# Patient Record
Sex: Female | Born: 1960
Health system: Southern US, Community
[De-identification: ages and names within clinical notes are randomized; demographics above are authoritative.]

## PROBLEM LIST (undated history)

## (undated) DIAGNOSIS — K219 Gastro-esophageal reflux disease without esophagitis: Secondary | ICD-10-CM

## (undated) DIAGNOSIS — I10 Essential (primary) hypertension: Secondary | ICD-10-CM

## (undated) DIAGNOSIS — E079 Disorder of thyroid, unspecified: Secondary | ICD-10-CM

## (undated) HISTORY — PX: DENTAL SURGERY: SHX609

## (undated) HISTORY — DX: Gastro-esophageal reflux disease without esophagitis: K21.9

## (undated) HISTORY — DX: Disorder of thyroid, unspecified: E07.9

## (undated) HISTORY — DX: Essential (primary) hypertension: I10

---

## 2005-06-10 ENCOUNTER — Ambulatory Visit: Payer: Self-pay | Admitting: Family Medicine

## 2005-06-10 IMAGING — CR DG CHEST 2V
1 series · 3 of 3 positions shown · non-contrast
Comparison: none

REASON FOR EXAM: Unspecified disorder of blood and blood forming organs
COMMENTS:

PROCEDURE:     DXR - DXR CHEST PA (OR AP) AND LATERAL  - [DATE] [DATE]
RESULT:     The lungs fields are clear. No pneumonia, pneumothorax or
pleural effusion is seen. The heart size is normal. There is a moderate
thoracic lumbar scoliosis with convexity to the RIGHT.

[Series 4128: postero_anterior · 0.22mm/px · 3 of 3 slices shown]
[im 1/3]
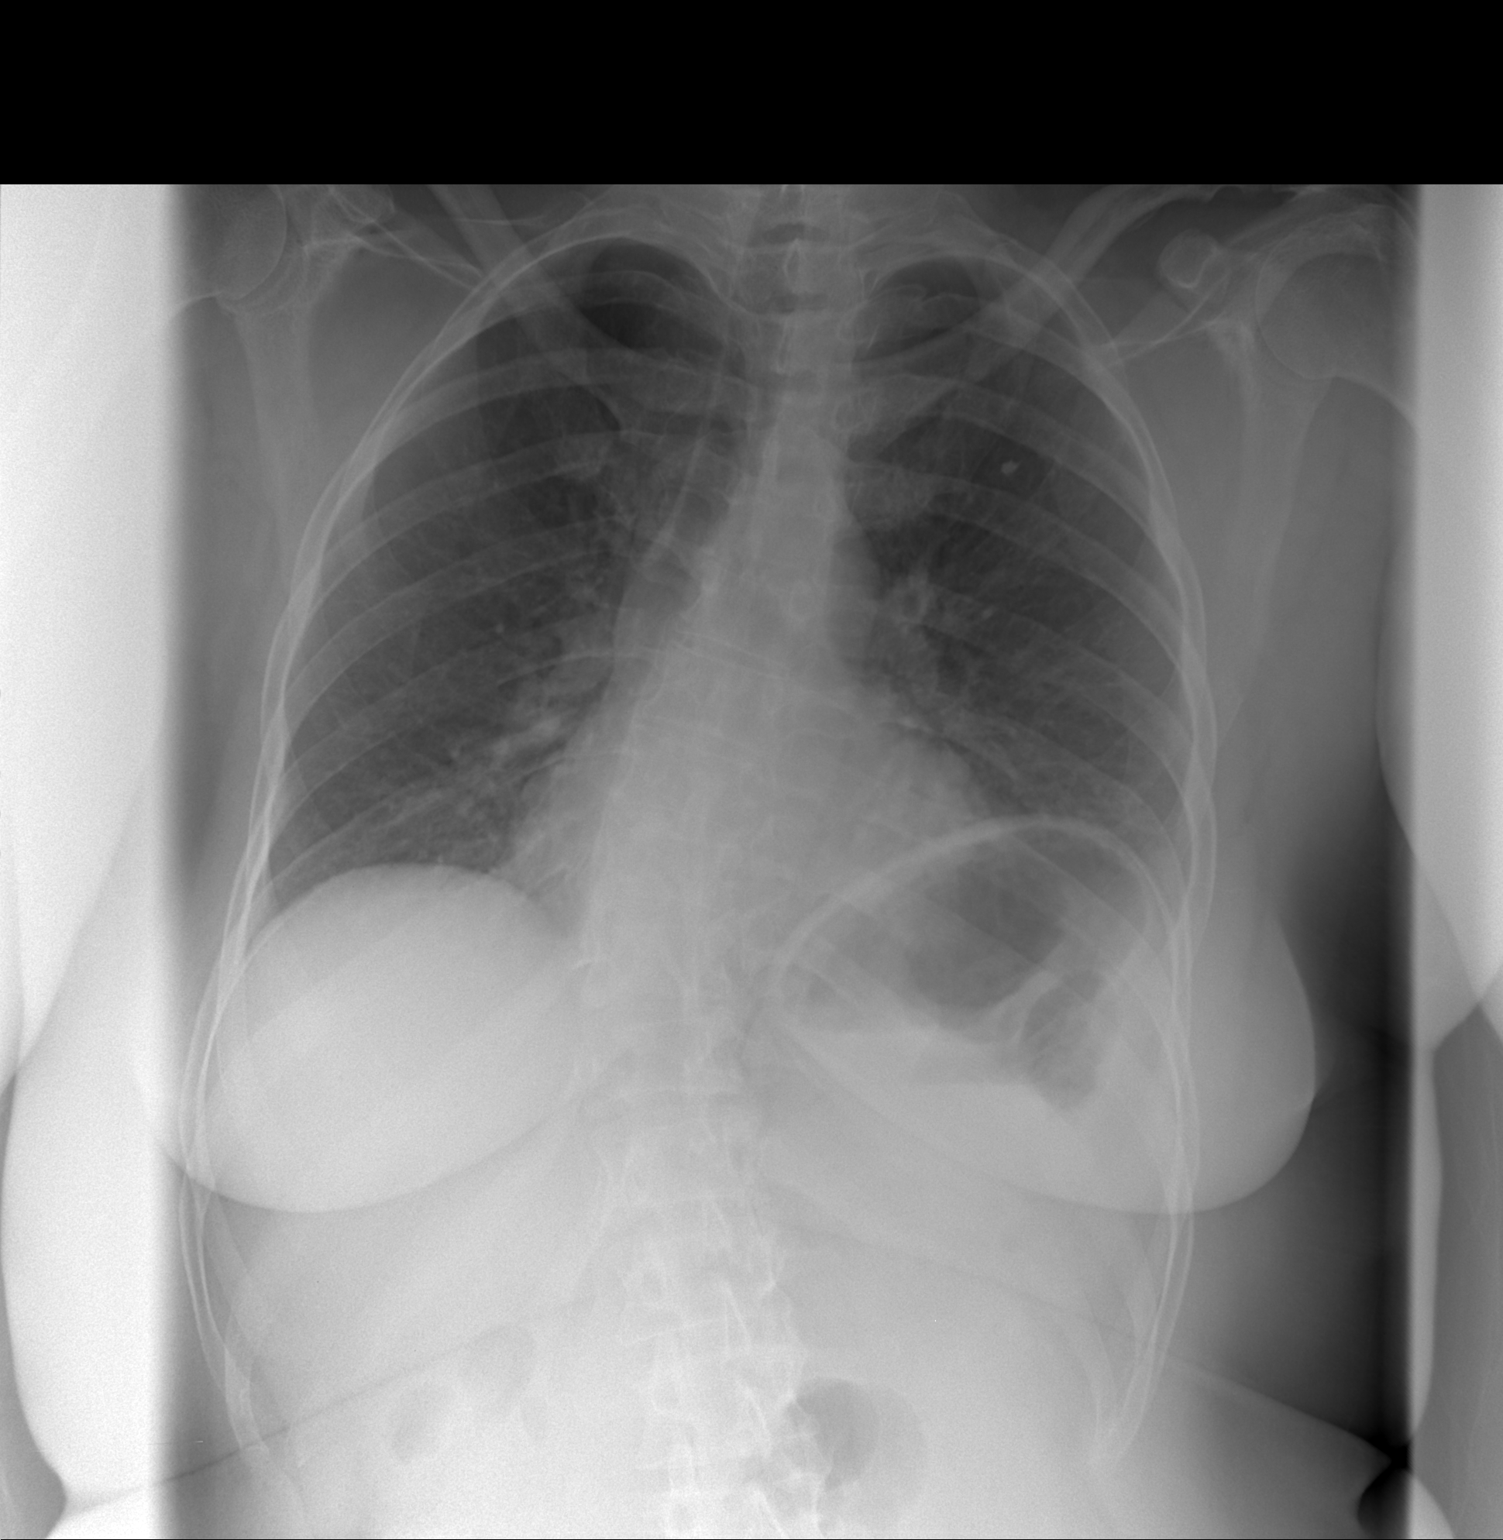
[im 2/3]
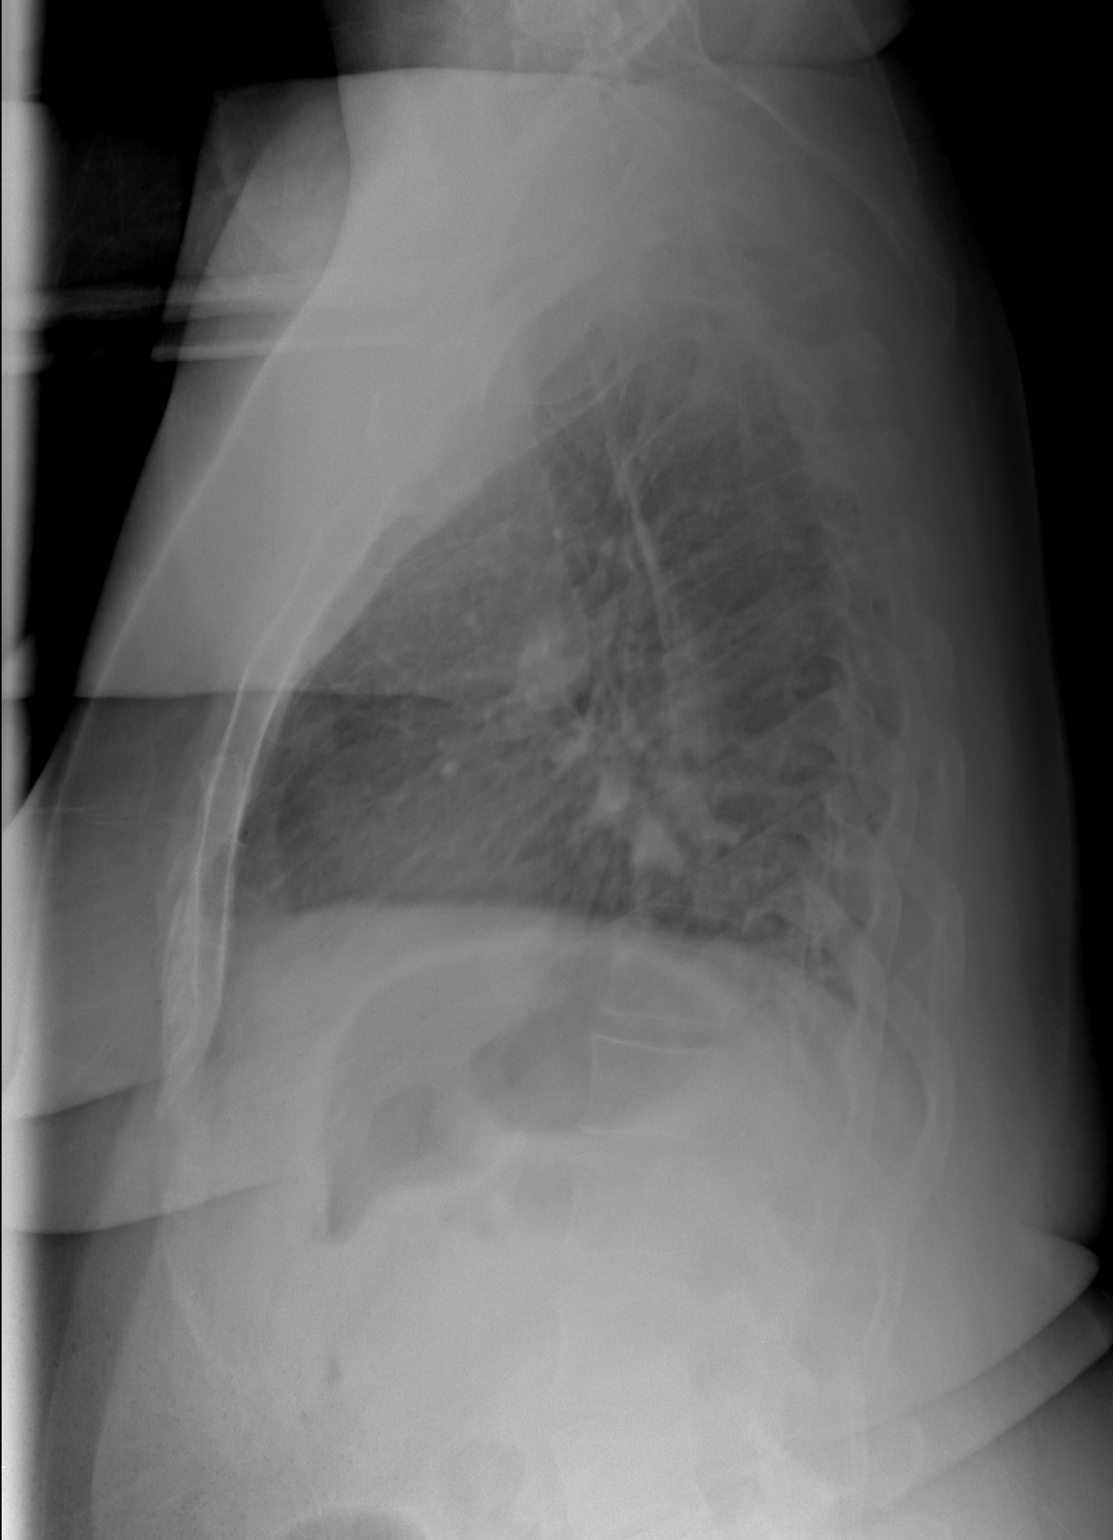
[im 3/3]
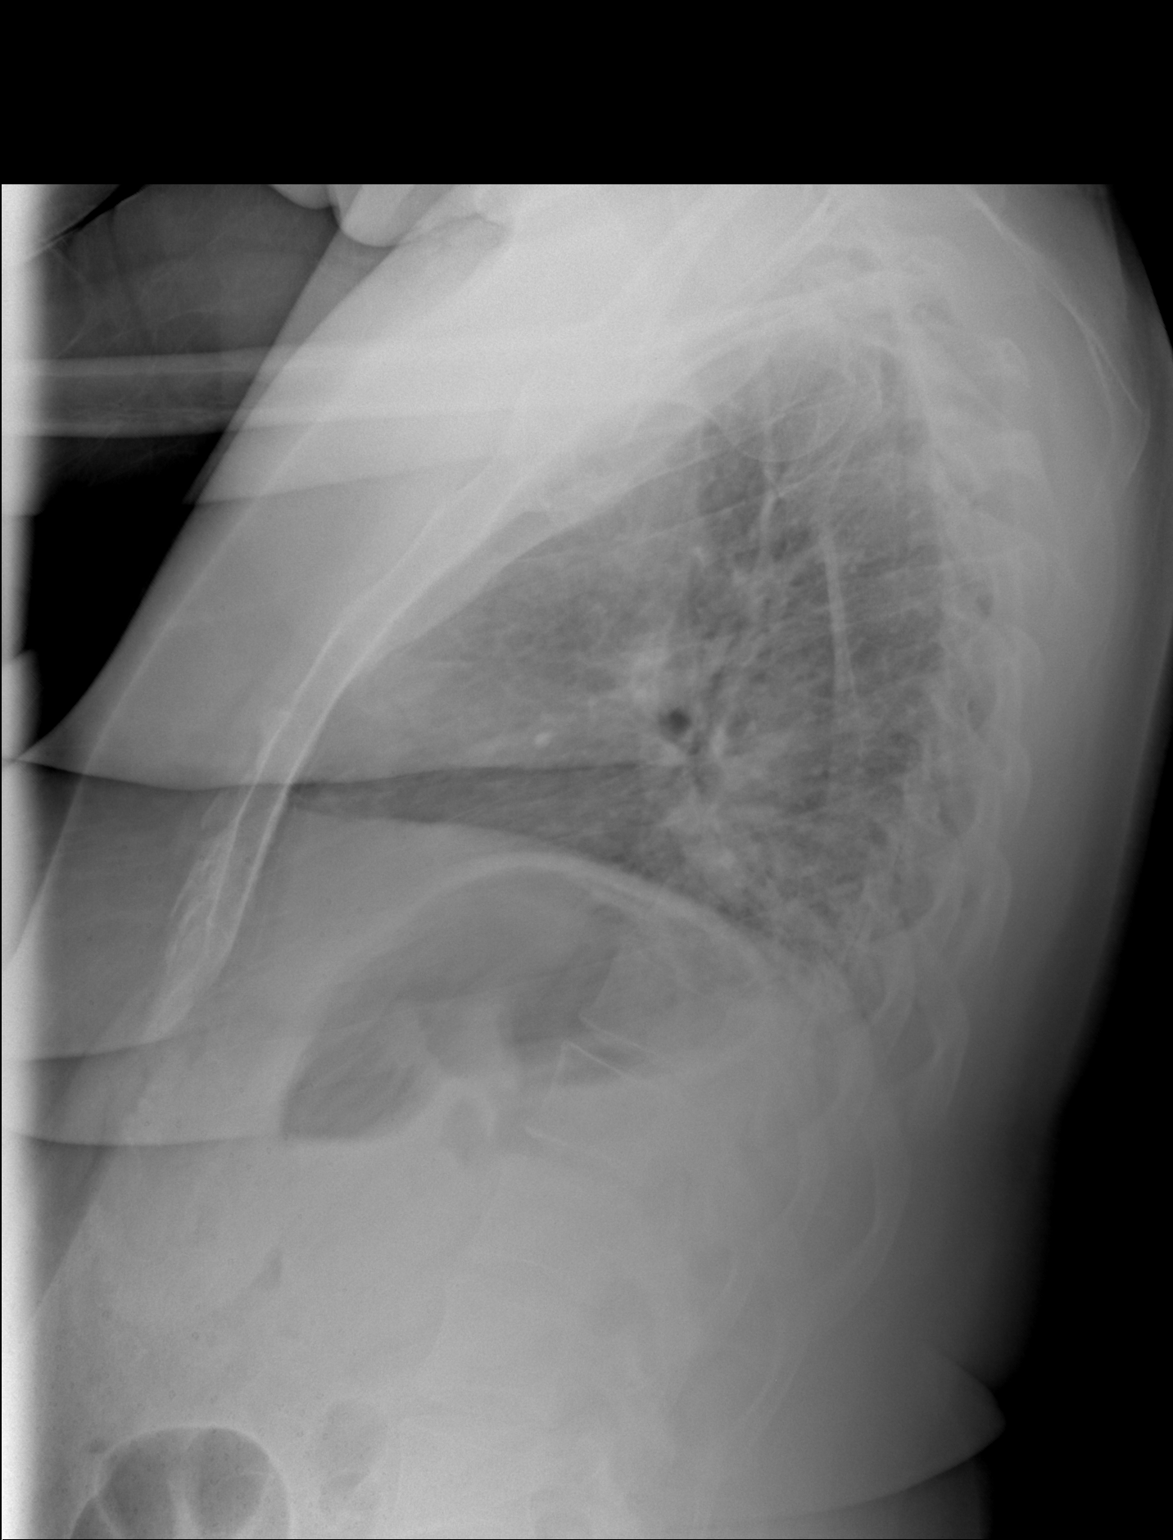

[3 of 3 positions shown; findings below may reference images not displayed]

IMPRESSION: 1.     No acute changes are identified.
2.     Thoracolumbar scoliosis is noted.
3.     The heart size is normal.

## 2006-03-07 ENCOUNTER — Ambulatory Visit: Payer: Self-pay

## 2007-05-09 ENCOUNTER — Ambulatory Visit: Payer: Self-pay | Admitting: Physician Assistant

## 2007-10-17 ENCOUNTER — Emergency Department: Payer: Self-pay | Admitting: Emergency Medicine

## 2008-05-22 ENCOUNTER — Ambulatory Visit: Payer: Self-pay | Admitting: Physician Assistant

## 2009-01-14 ENCOUNTER — Ambulatory Visit: Payer: Self-pay | Admitting: Family Medicine

## 2009-01-14 IMAGING — CR RIGHT ANKLE - COMPLETE 3+ VIEW
1 series · 5 of 5 positions shown · non-contrast
Comparison: none

REASON FOR EXAM: pain
COMMENTS:

PROCEDURE:     KDR - KDXR ANKLE RIGHT COMPLETE  - [DATE] [DATE]
RESULT:     Images of the right ankle demonstrate no definite fracture,
dislocation or radiopaque foreign body. There was some difficulty
positioning the patient because of pain.

[Series 2: view not recorded · 0.17mm/px · 5 of 5 slices shown]
[im 1/5]
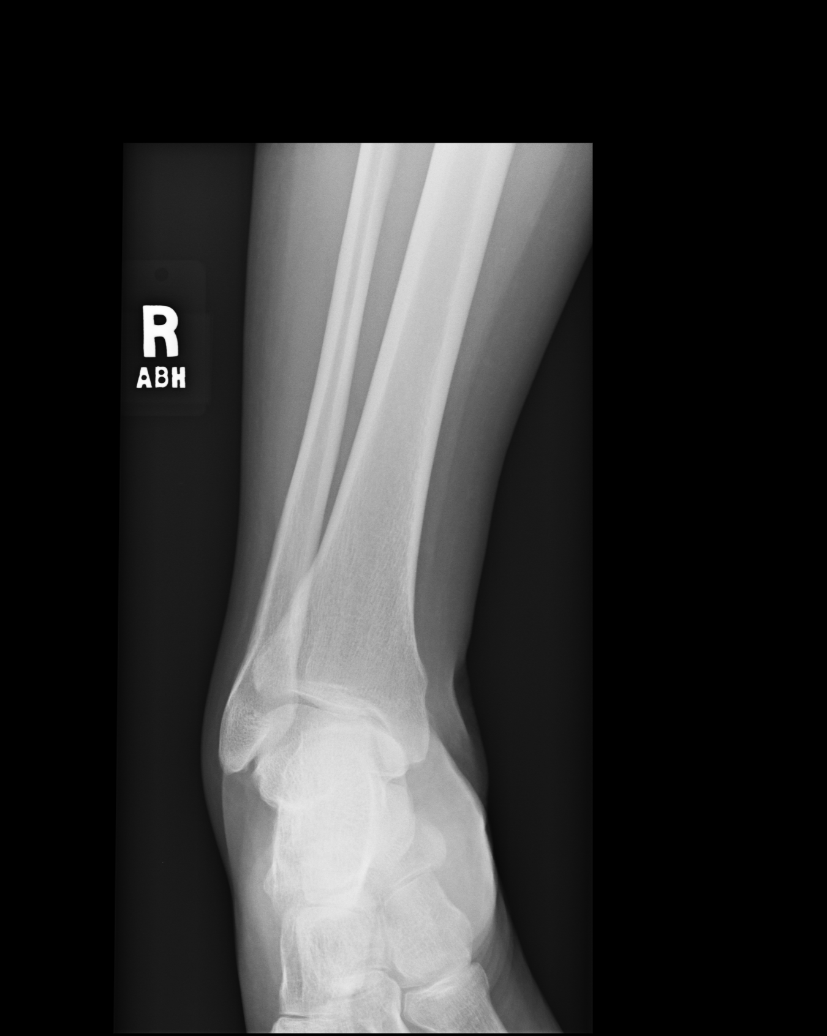
[im 2/5]
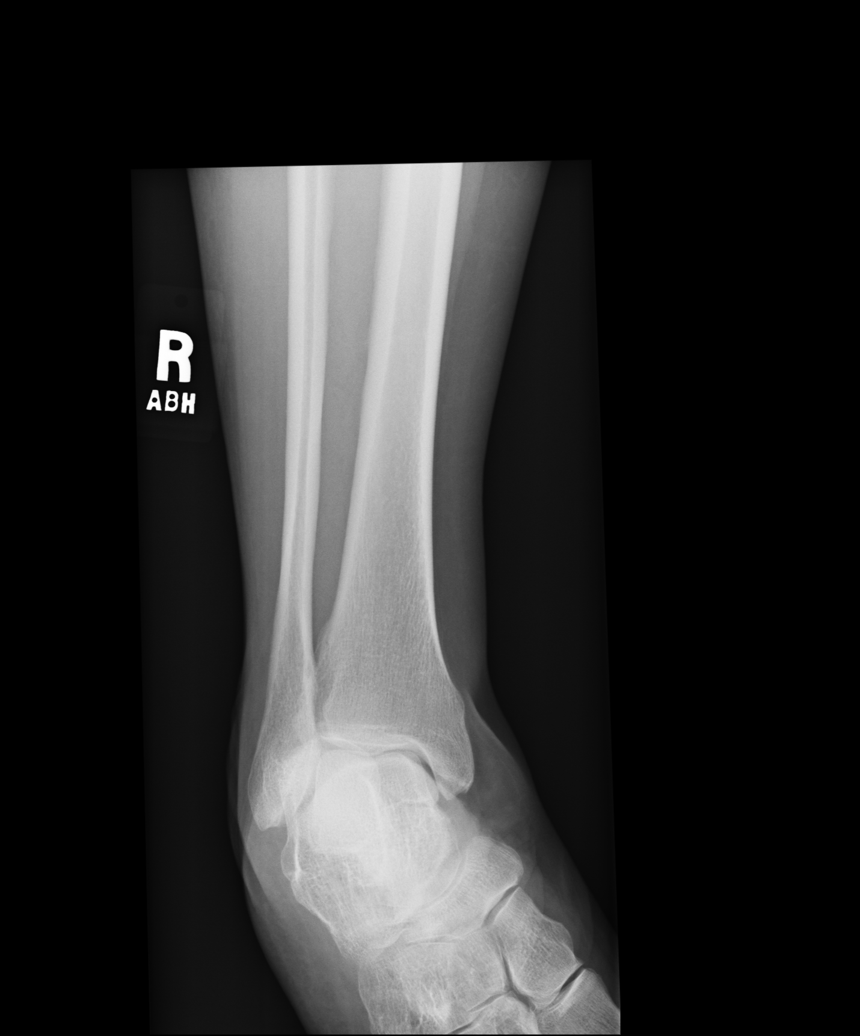
[im 3/5]
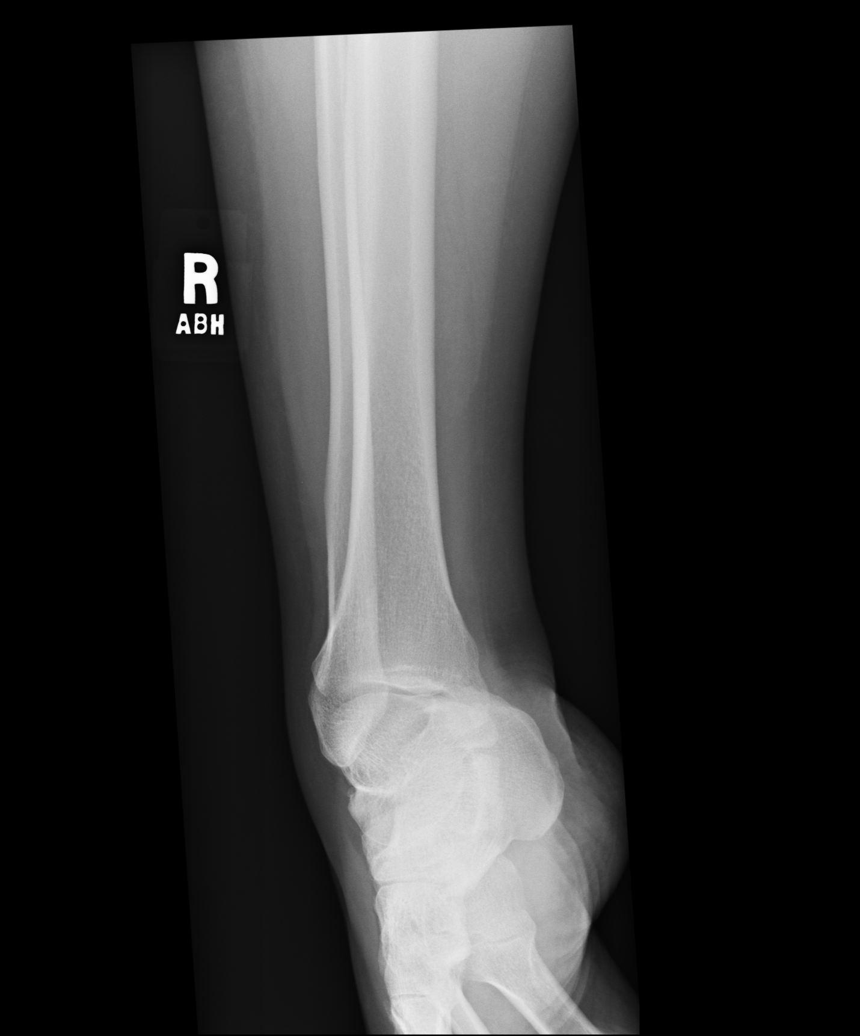
[im 4/5]
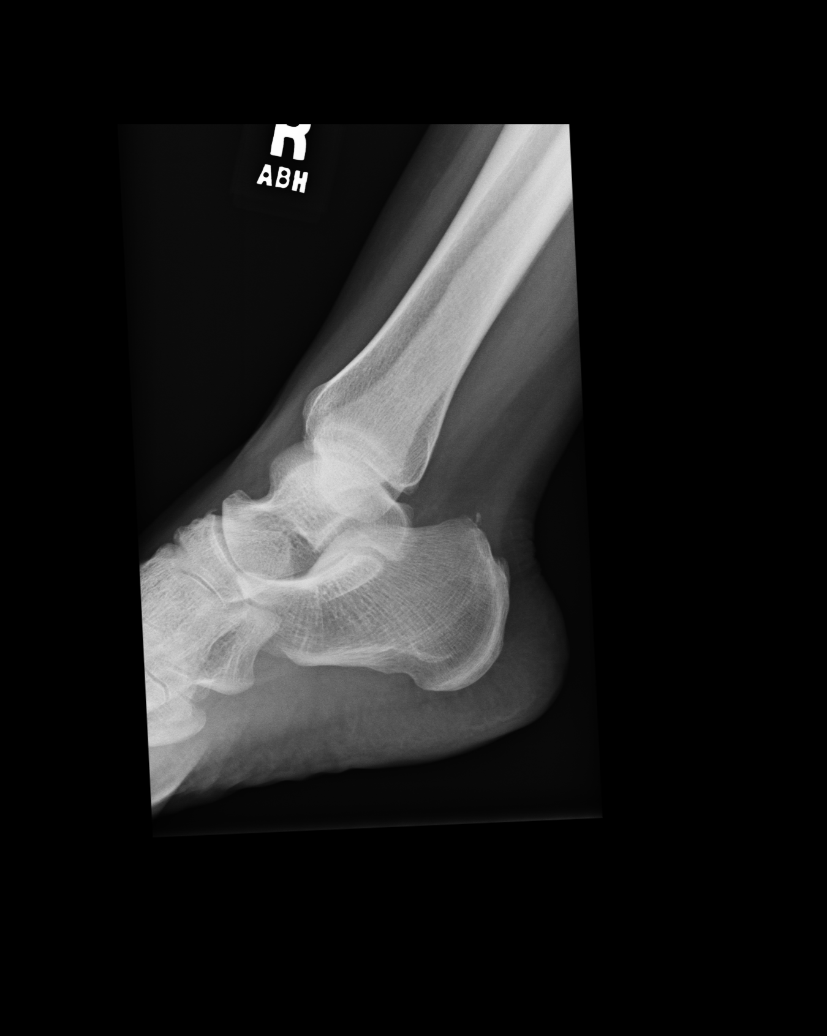
[im 5/5]
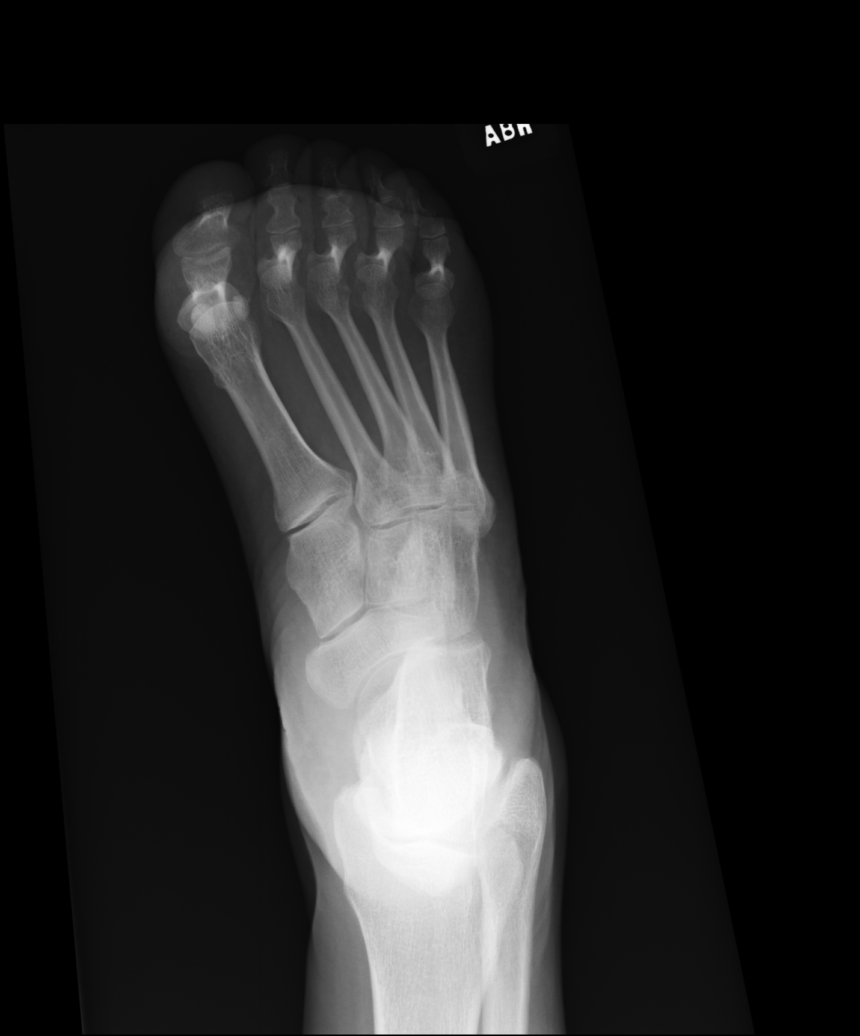

[5 of 5 positions shown; findings below may reference images not displayed]

IMPRESSION: No acute bony abnormality.

## 2009-04-13 ENCOUNTER — Ambulatory Visit: Payer: Self-pay | Admitting: Family Medicine

## 2009-04-13 IMAGING — CR DG KNEE COMPLETE 4+V*R*
1 series · 4 of 4 positions shown · non-contrast
Comparison: none

REASON FOR EXAM: right knee pain/low back pain
COMMENTS:

PROCEDURE:     KDR - KDXR KNEE RT COMP WITH OBLIQUES  - [DATE]  [DATE]
RESULT:     Four views of the right knee are submitted. The bones are
adequately mineralized. I do not see evidence of acute fracture nor
dislocation. No more than minimal degenerative type change is demonstrated.

[Series 2: view not recorded · 0.17mm/px · 4 of 4 slices shown]
[im 1/4]
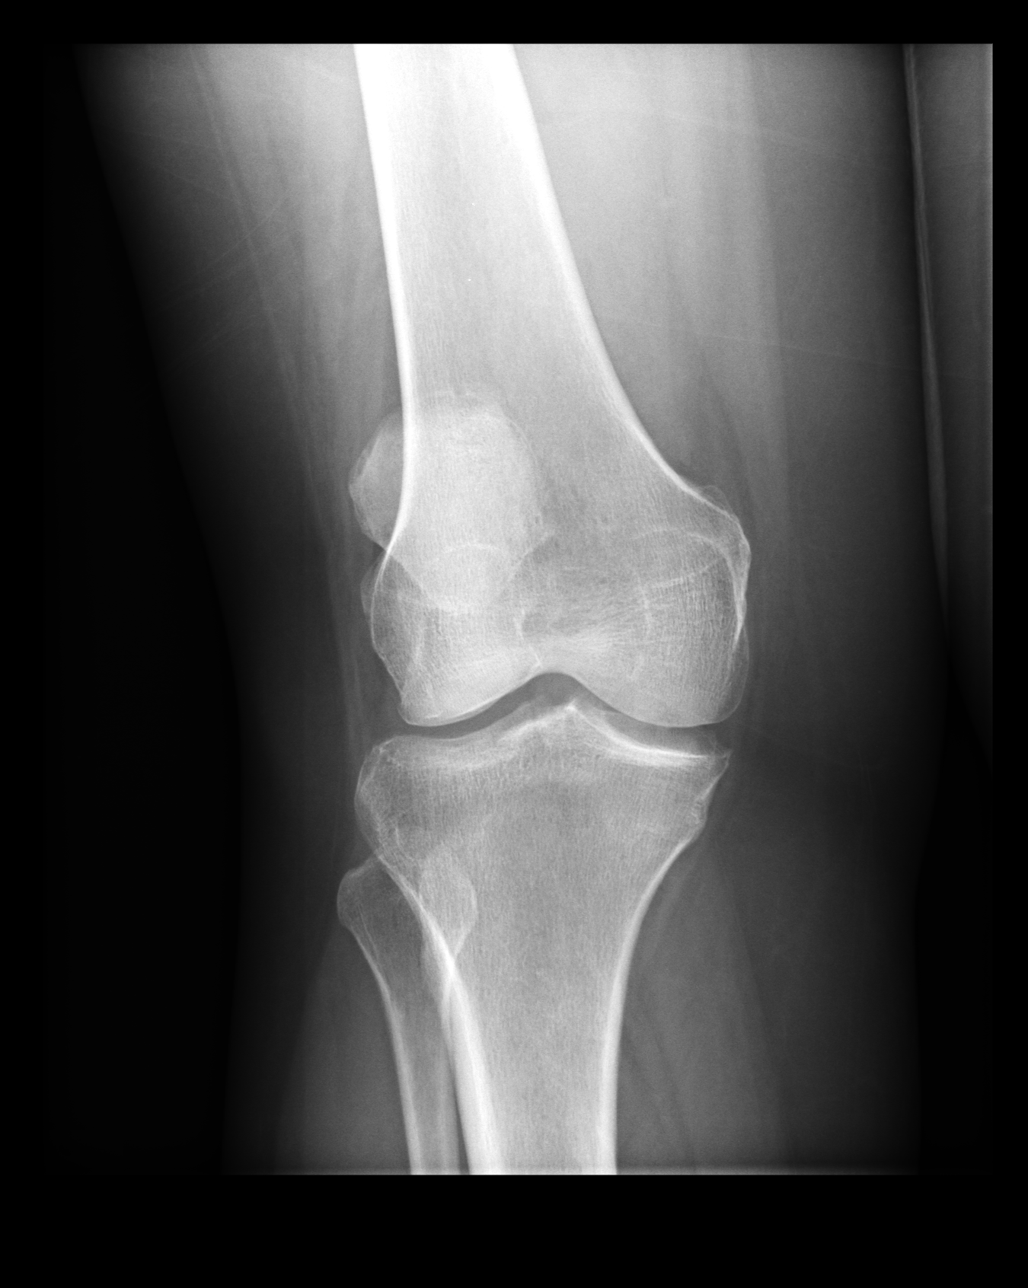
[im 2/4]
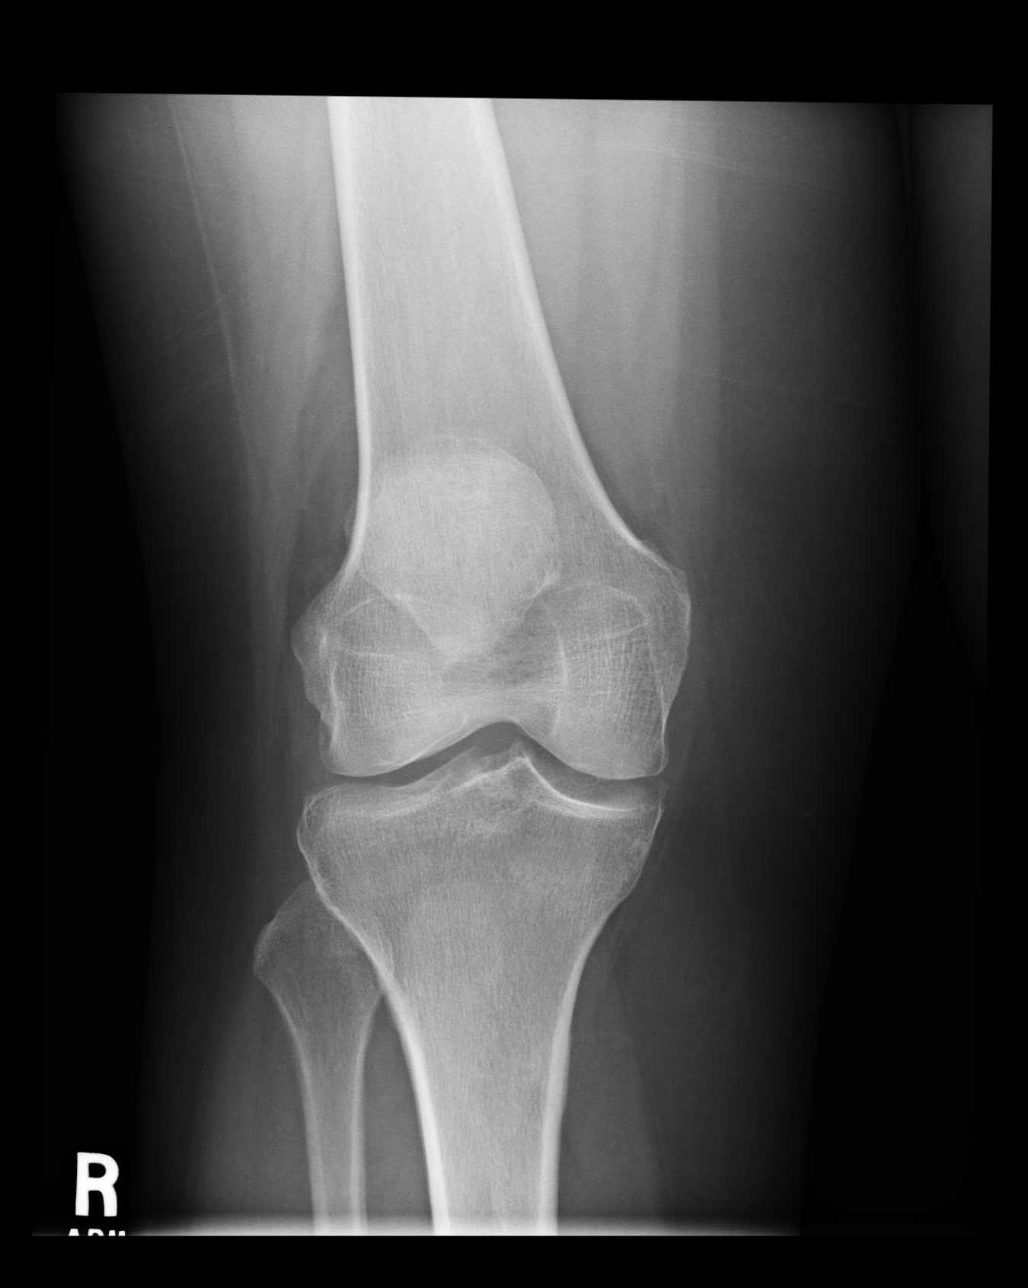
[im 3/4]
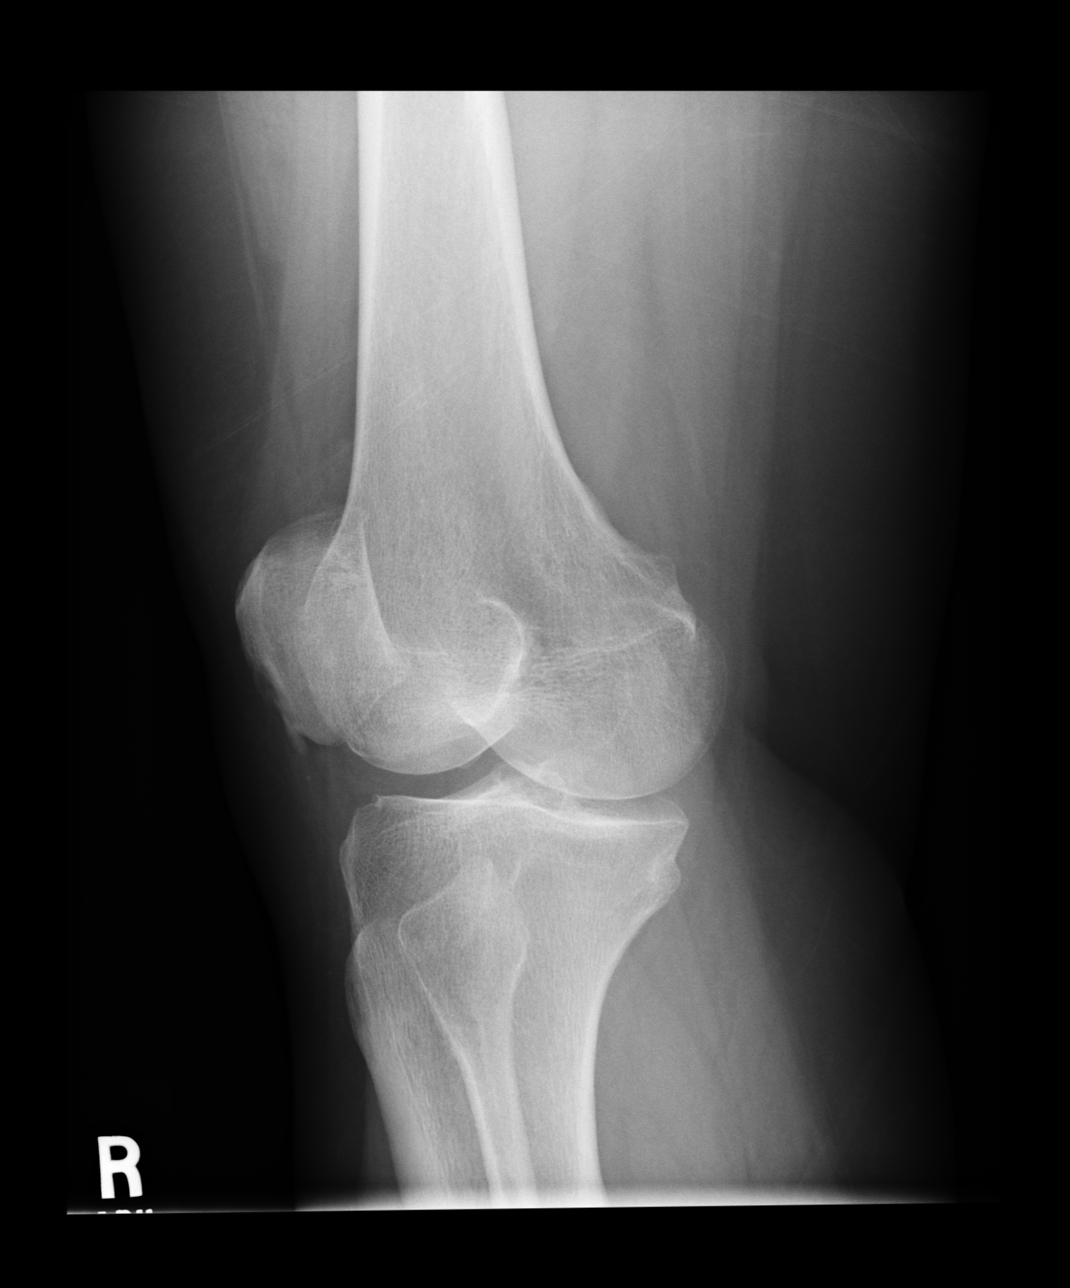
[im 4/4]
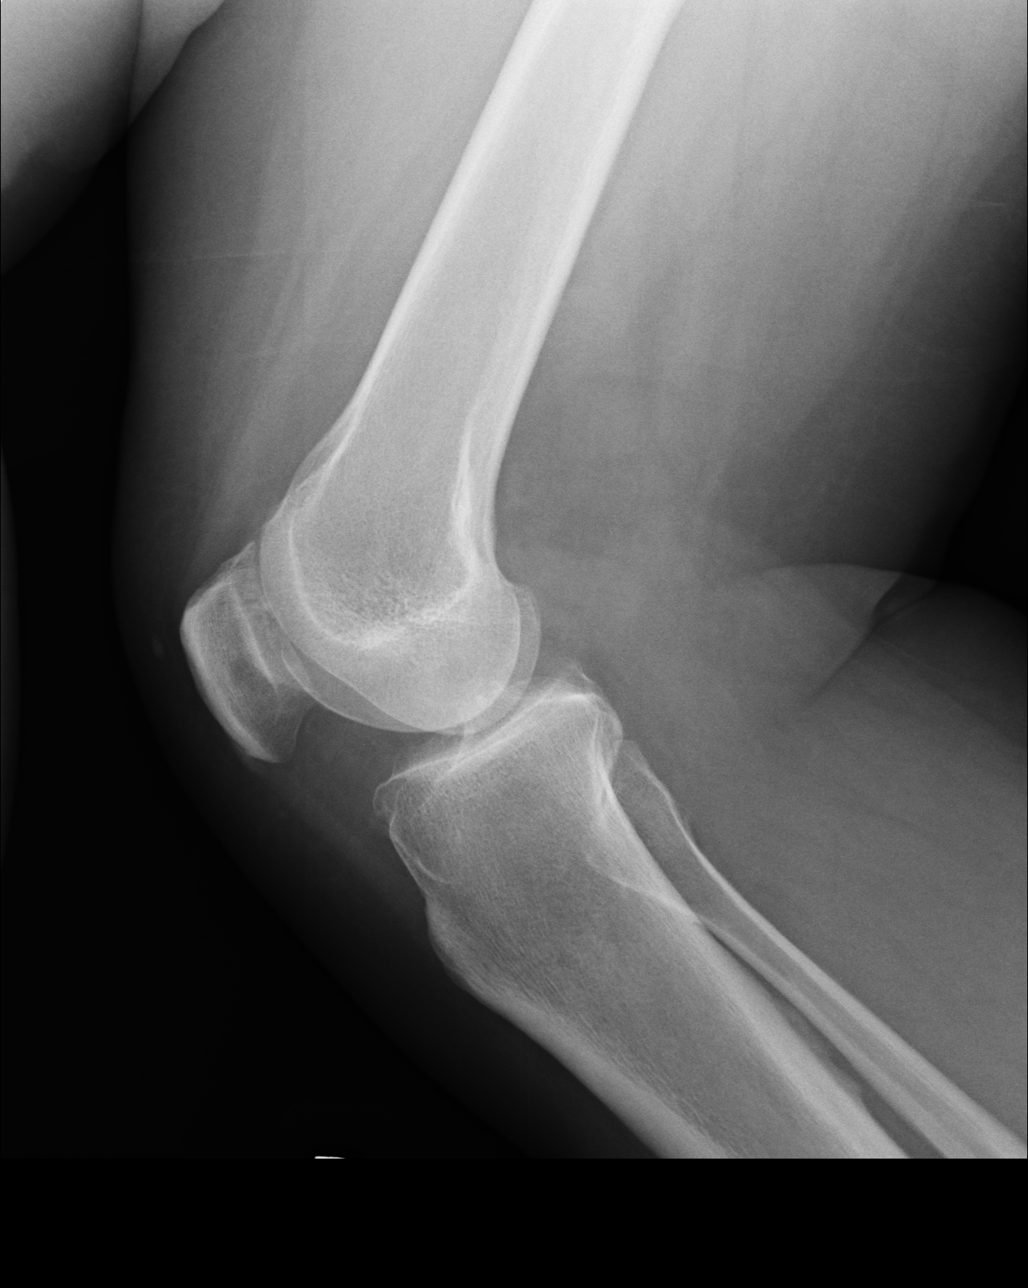

[4 of 4 positions shown; findings below may reference images not displayed]

IMPRESSION: I see no acute bony abnormality of the right knee.

## 2009-04-13 IMAGING — CR DG LUMBAR SPINE 2-3V
1 series · 3 of 3 positions shown · non-contrast
Comparison: none

REASON FOR EXAM: right knee pain/low back pain
COMMENTS:

[Series 2: view not recorded · 0.17mm/px · 3 of 3 slices shown]
[im 1/3]
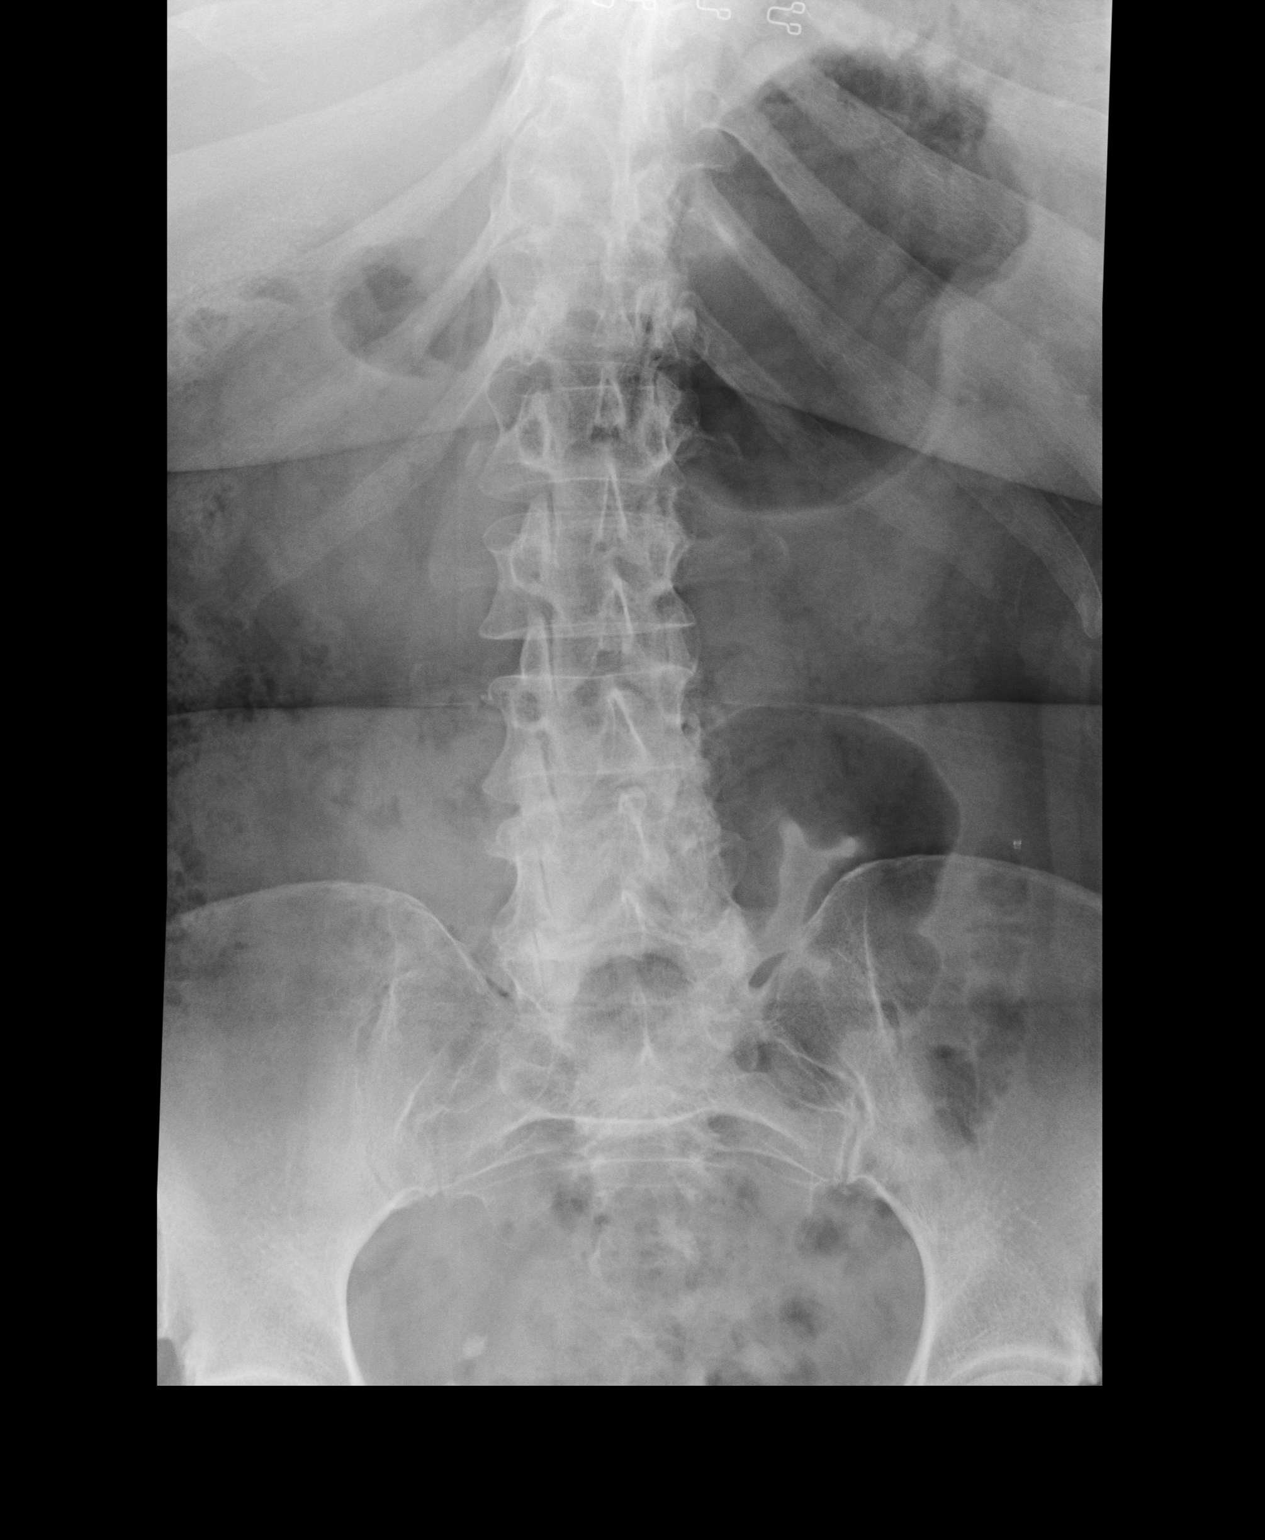
[im 2/3]
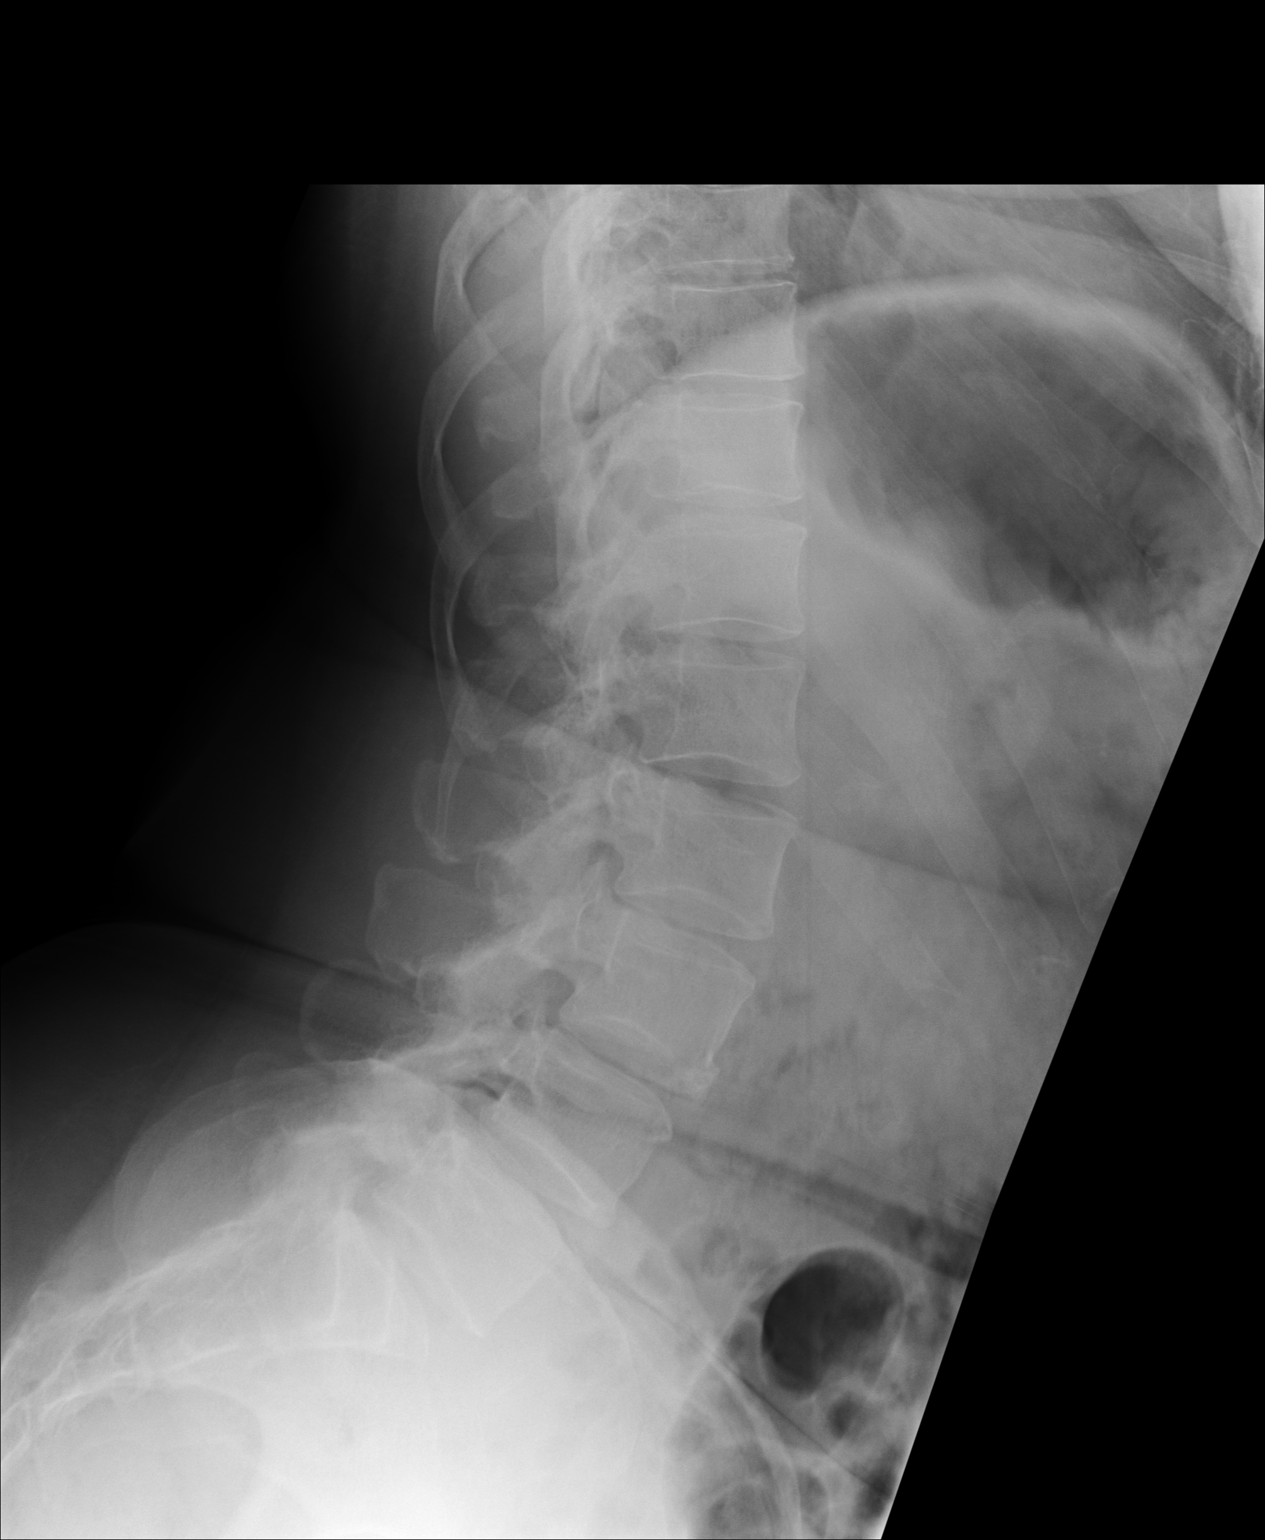
[im 3/3]
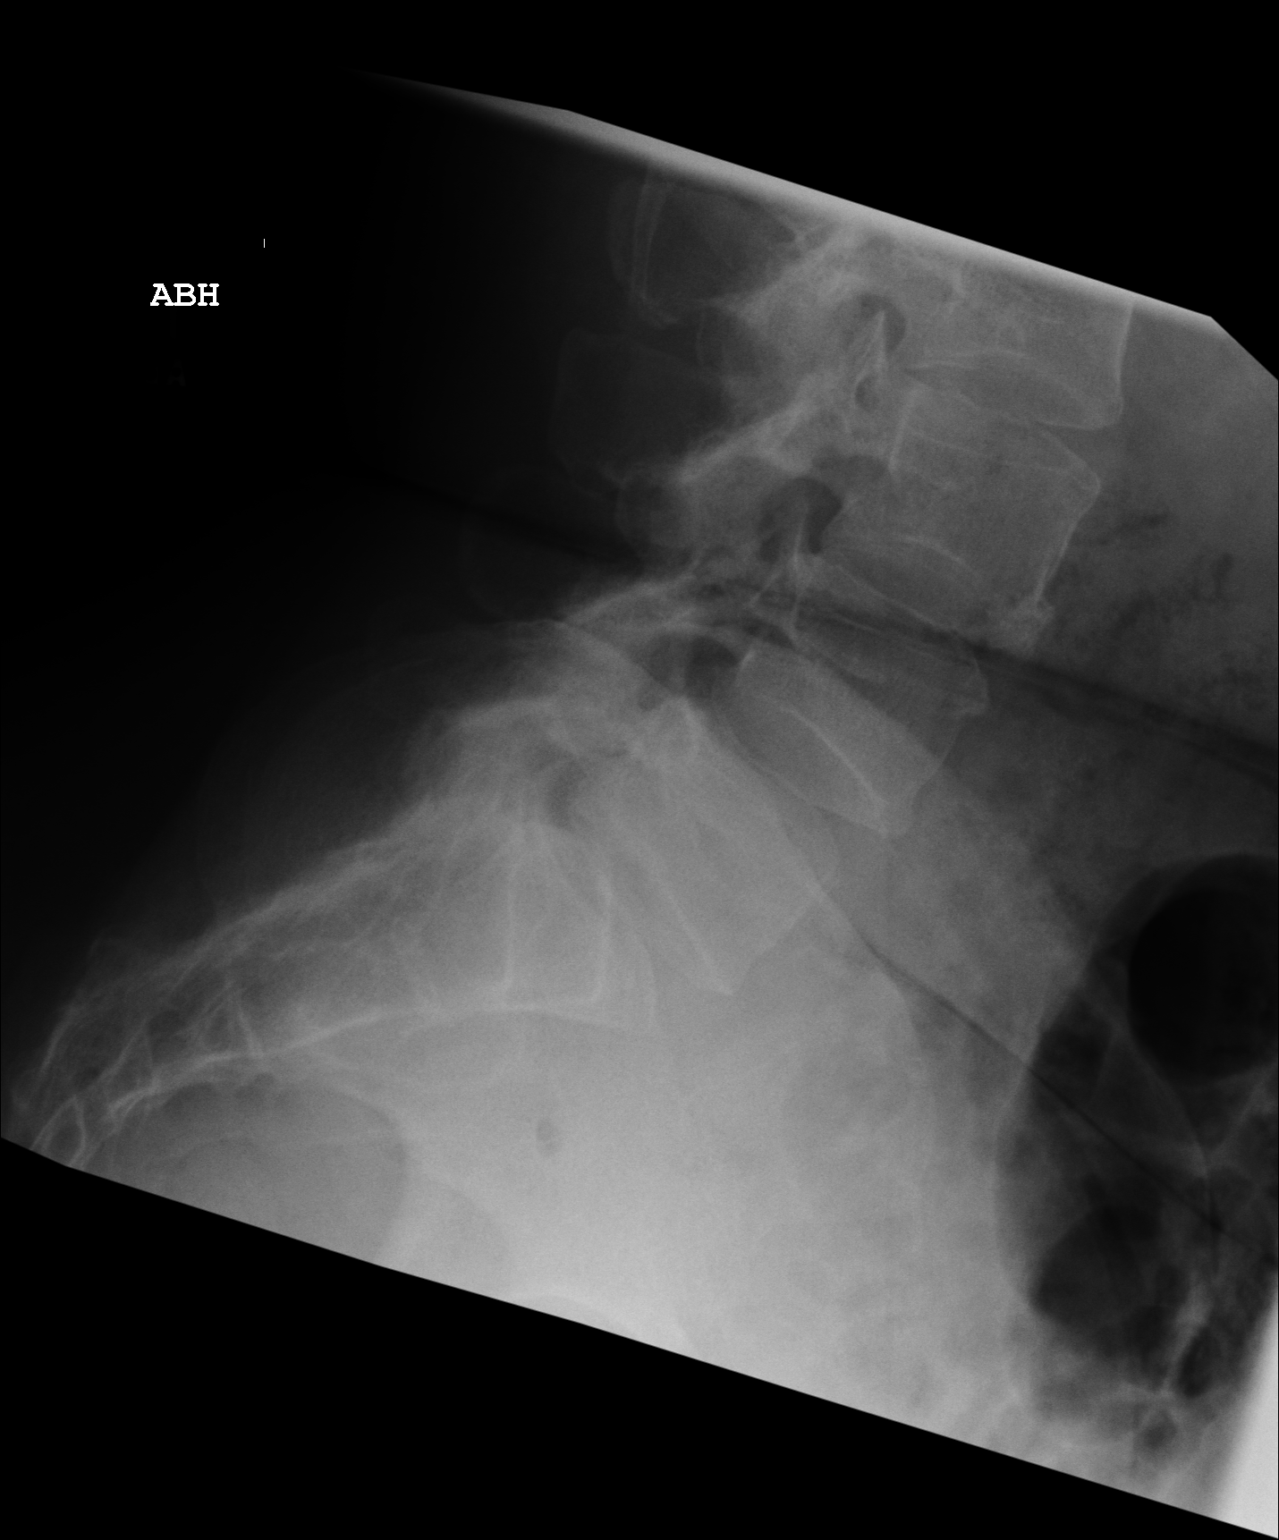

[3 of 3 positions shown; findings below may reference images not displayed]

PROCEDURE:     KDR - KDXR LUMBAR SPINE AP AND LATERAL  - [DATE]  [DATE]

RESULT:     The lumbar vertebral bodies are preserved in height. The
intervertebral disc space heights are reasonably well maintained as well. I
do not see evidence of spondylolisthesis. There is gentle curvature of the
lumbar spine with the convexity toward the right.
IMPRESSION: I do not see acute bony abnormality of the lumbar spine.
Mild degenerative endplate spurring is present and there is gentle curvature
with the convexity toward the right.

## 2009-09-08 ENCOUNTER — Ambulatory Visit: Payer: Self-pay | Admitting: Family Medicine

## 2010-11-22 ENCOUNTER — Ambulatory Visit: Payer: Self-pay | Admitting: Family Medicine

## 2010-12-03 ENCOUNTER — Ambulatory Visit: Payer: Self-pay | Admitting: Family Medicine

## 2010-12-16 ENCOUNTER — Ambulatory Visit: Payer: Self-pay | Admitting: Family Medicine

## 2010-12-16 IMAGING — US ULTRASOUND LEFT BREAST
1 series · 17 of 25 positions shown · non-contrast
Comparison: [DATE], [DATE], [DATE] and [DATE].

REASON FOR EXAM: nodule
COMMENTS:

PROCEDURE:     US  - US BREAST LEFT  - [DATE] [DATE]
RESULT:

[Series 1: ultrasound left breast · 17 of 41 slices shown]
[im 1/41]
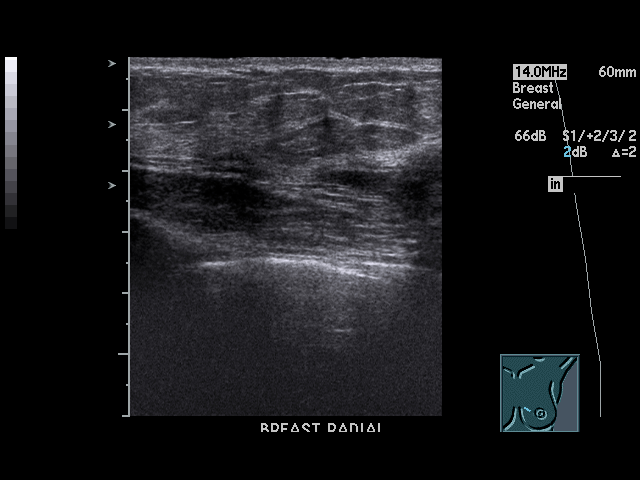
[im 4/41]
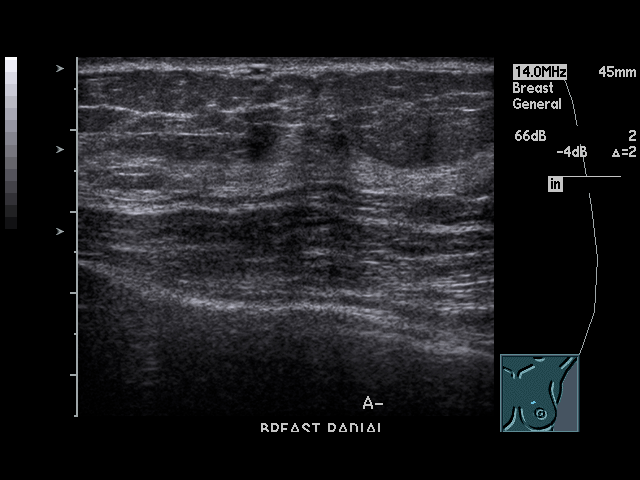
[im 6/41]
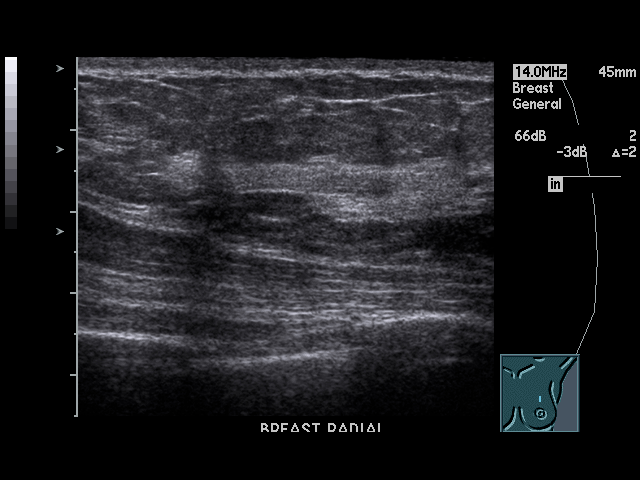
[im 9/41]
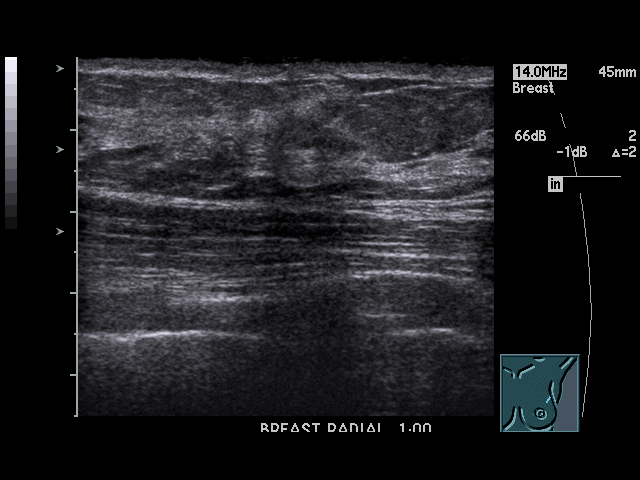
[im 11/41]
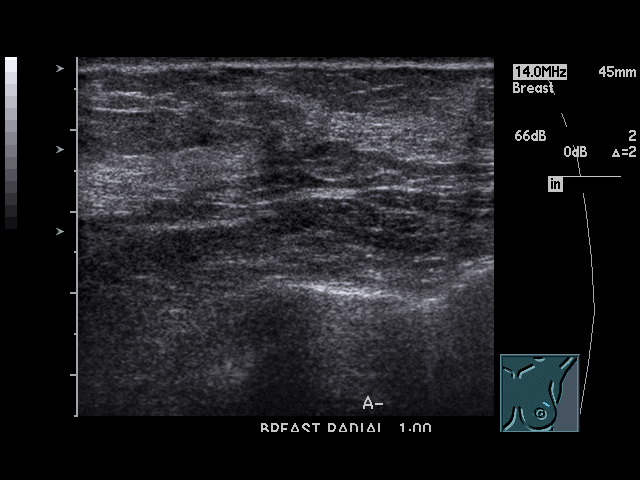
[im 14/41]
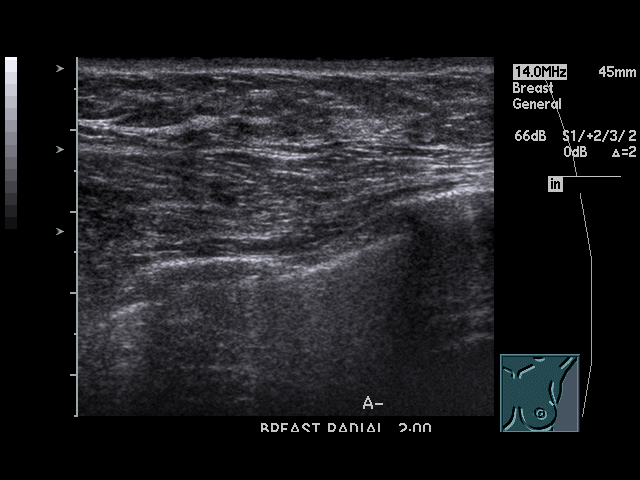
[im 16/41]
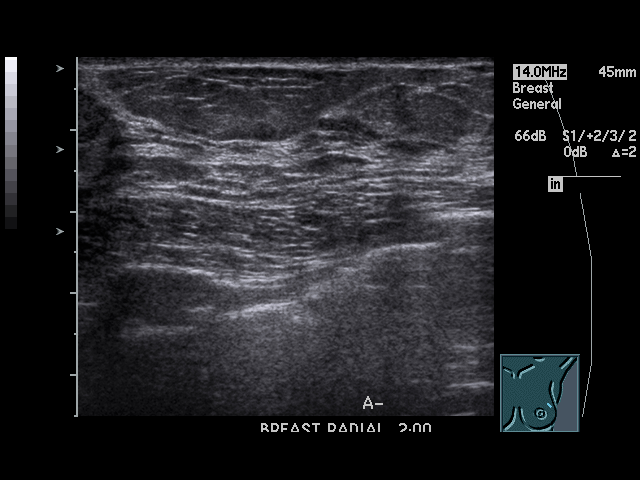
[im 19/41]
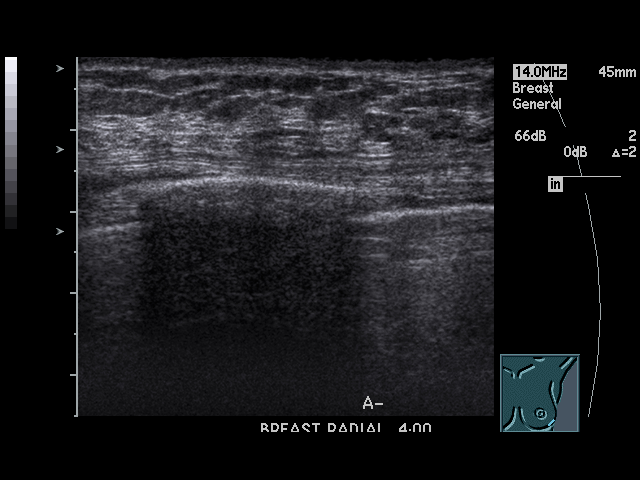
[im 21/41]
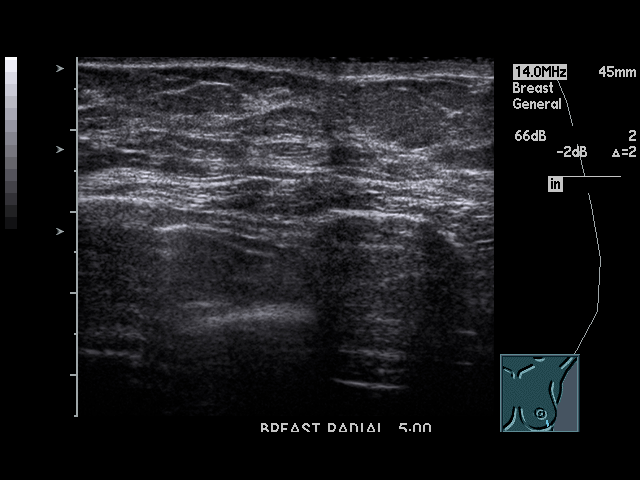
[im 22/41]
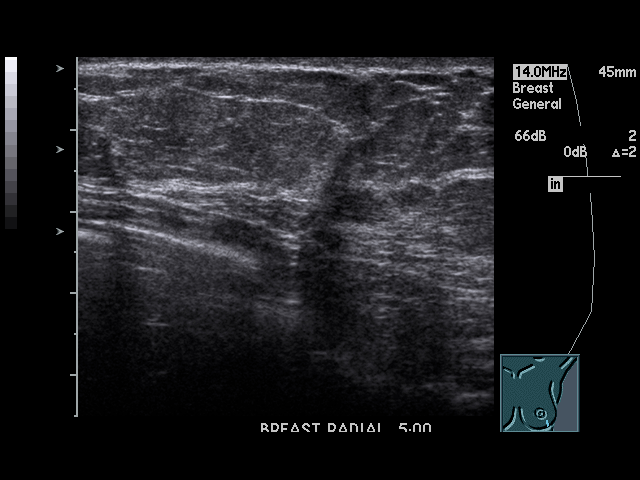
[im 26/41]
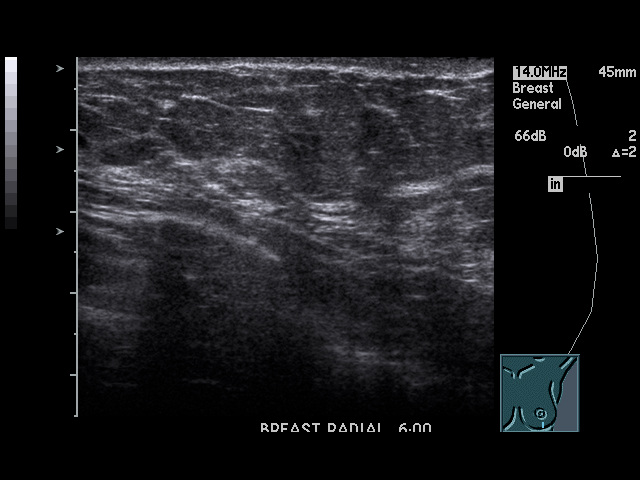
[im 27/41]
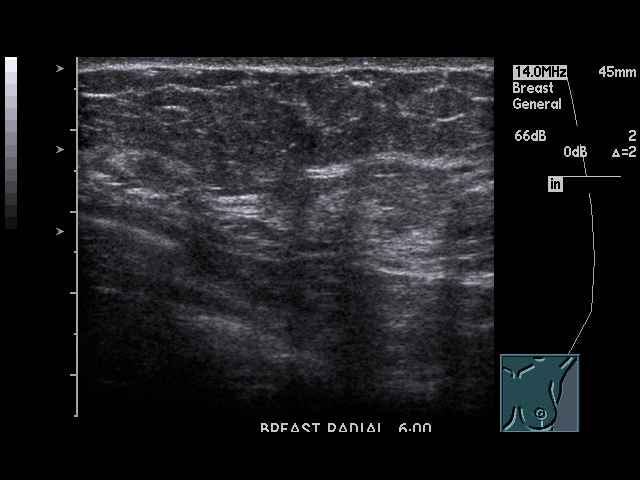
[im 31/41]
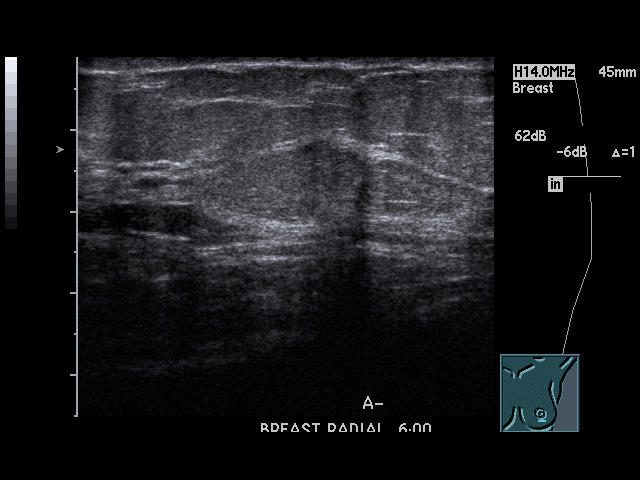
[im 32/41]
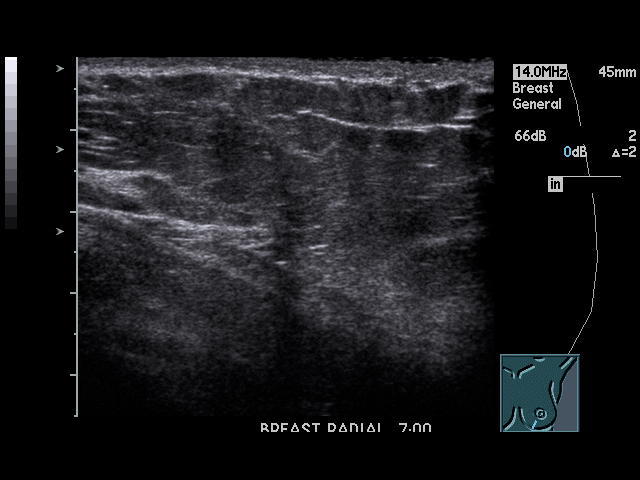
[im 36/41]
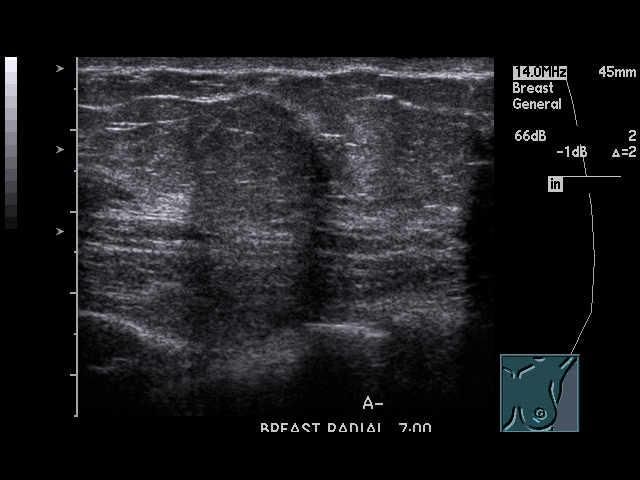
[im 37/41]
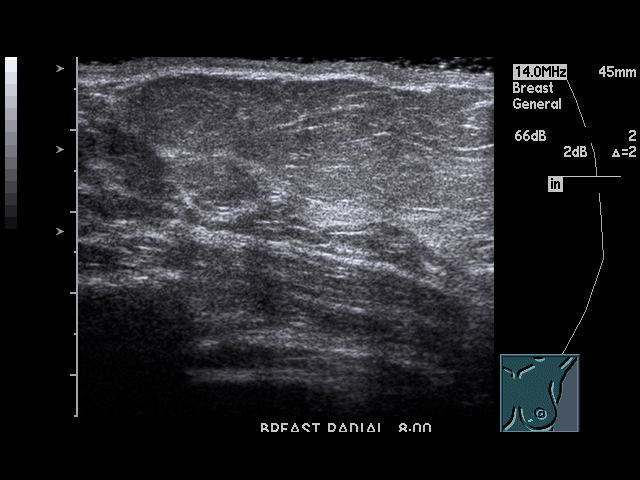
[im 41/41]
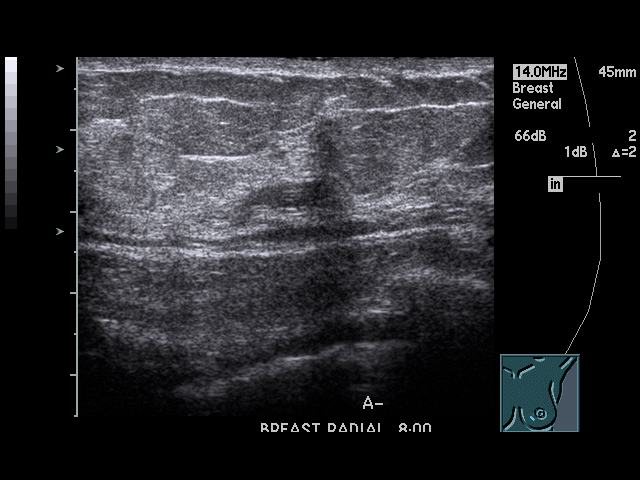

[17 of 25 positions shown; findings below may reference images not displayed]

FINDINGS: The patient was brought back to the Mammography  Department for
additional views of the left breast to evaluate the asymmetry in the
posterior depth at or just medial to the level of the nipple. With these
spot compression magnification views, the asymmetry persisted.  This also
appeared to have persisted on the mammograms of the left breast performed on
[DATE]. It is unclear where this is located on the true lateral and MLO
views of the left breast.  However, it would likely be in the superior
aspect as it rolled lateral with lateral rolling in the CC dimension.
Additionally, this is where the majority of the fibroglandular tissue is on
the MLO and true lateral views.

Real-time ultrasound was performed of the left breast from 10 o'clock
through 2 o'clock as well as 4 o'clock through 8 o'clock.  No mass or
suspicious shadowing identified.
IMPRESSION: BI-RADS:  Category 4- Suspicious Abnormality.

Surgical consultation and tissue diagnosis are suggested.  The small
asymmetry in the posterior depth of the left CC view, near midline, cannot
be confidently localized on the true lateral or MLO views. However, this
finding remains suspicious as it appears to persist on the spot compression
magnification views as well as the rolled medial and rolled lateral views.
It would likely be located in the superior aspect of the left breast given
the positioning on the rolled views as well as the degree of fibroglandular
tissue on the MLO and true lateral views. As this was not seen by
ultrasound, consideration could be given to attempt for tissue diagnosis
with Stereotactic biopsy.

## 2011-05-17 DIAGNOSIS — F3289 Other specified depressive episodes: Secondary | ICD-10-CM | POA: Diagnosis not present

## 2011-05-17 DIAGNOSIS — F329 Major depressive disorder, single episode, unspecified: Secondary | ICD-10-CM | POA: Diagnosis not present

## 2011-05-24 DIAGNOSIS — F79 Unspecified intellectual disabilities: Secondary | ICD-10-CM | POA: Diagnosis not present

## 2011-05-24 DIAGNOSIS — Z0000 Encounter for general adult medical examination without abnormal findings: Secondary | ICD-10-CM | POA: Diagnosis not present

## 2011-05-27 ENCOUNTER — Ambulatory Visit: Payer: Self-pay | Admitting: Family Medicine

## 2011-05-27 DIAGNOSIS — K8020 Calculus of gallbladder without cholecystitis without obstruction: Secondary | ICD-10-CM | POA: Diagnosis not present

## 2011-05-27 IMAGING — US ABDOMEN ULTRASOUND LIMITED
1 series · 17 of 25 positions shown · non-contrast
Comparison: none

REASON FOR EXAM: RUQ PAIN
COMMENTS:

[Series 1: abdomen ultrasound limited · 17 of 96 slices shown]
[im 1/96]
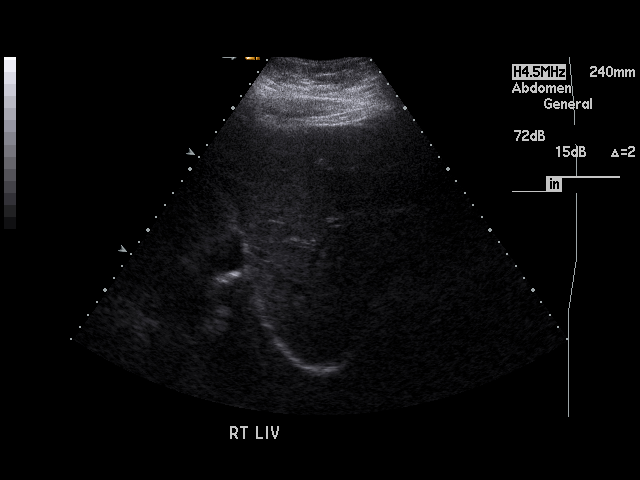
[im 8/96]
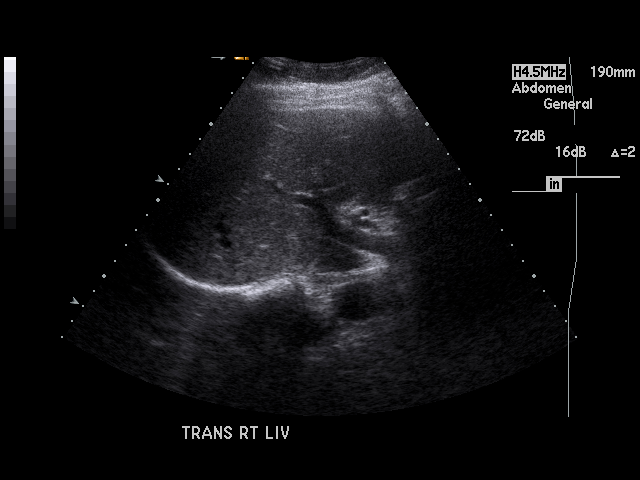
[im 12/96]
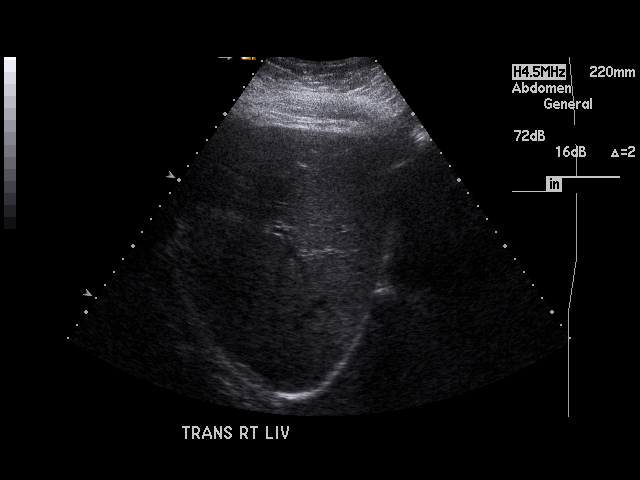
[im 20/96]
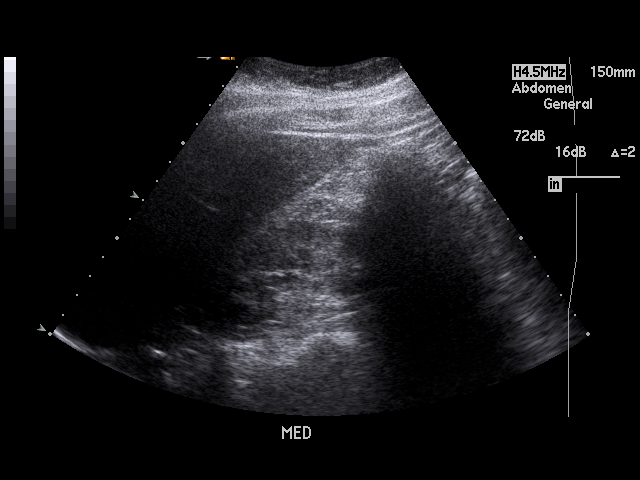
[im 24/96]
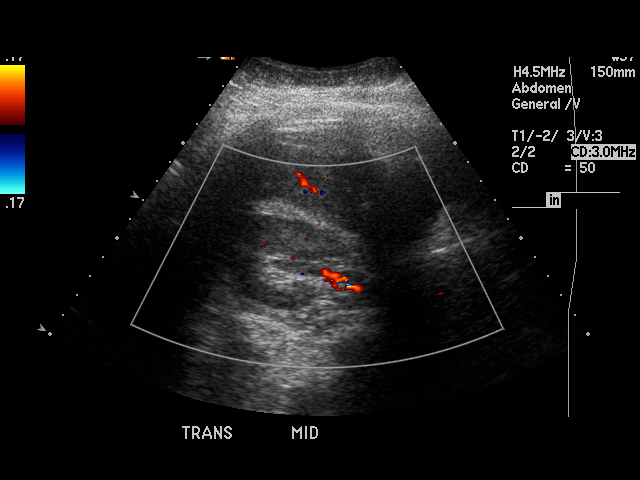
[im 32/96]
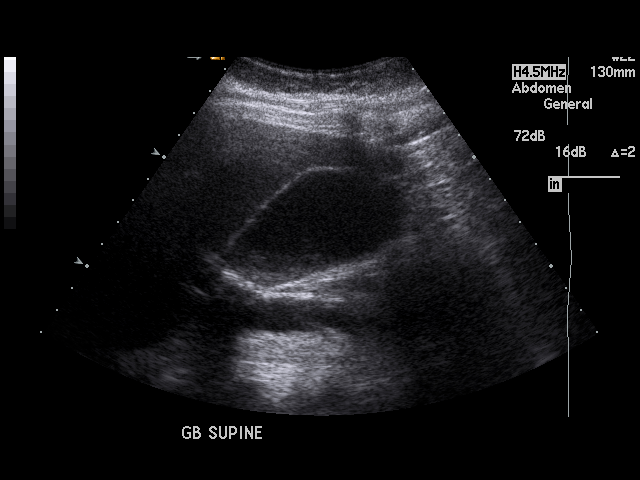
[im 36/96]
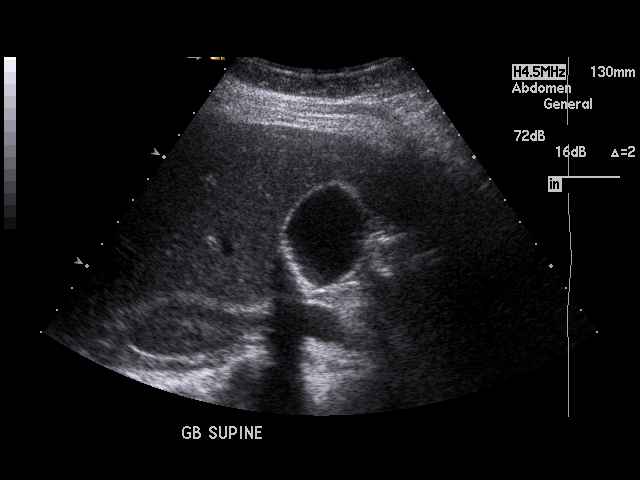
[im 44/96]
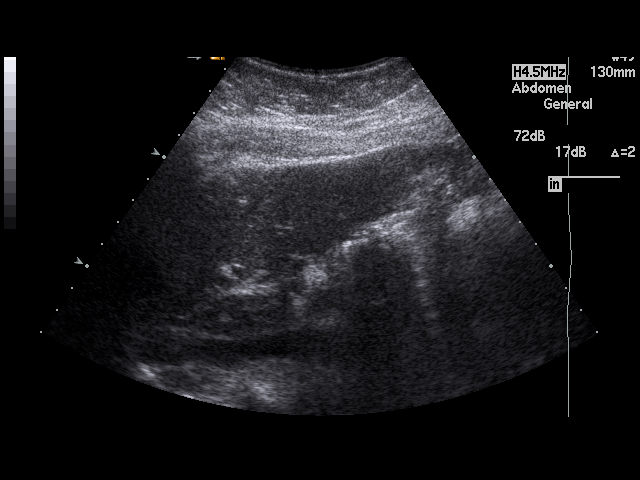
[im 48/96]
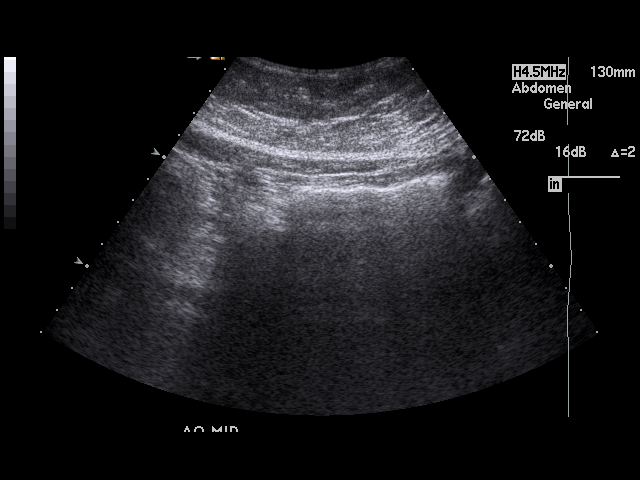
[im 52/96]
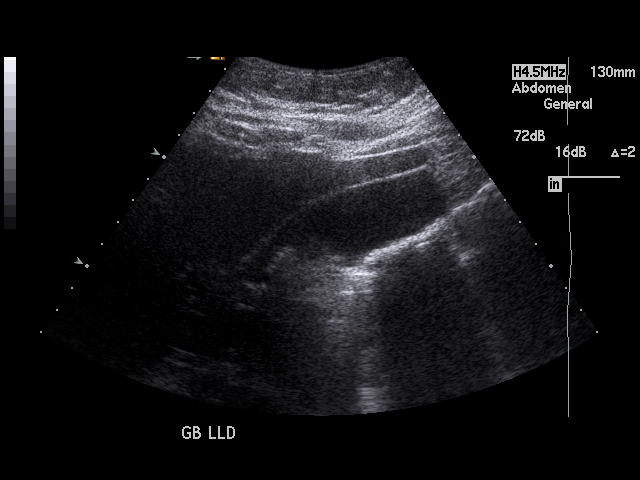
[im 60/96]
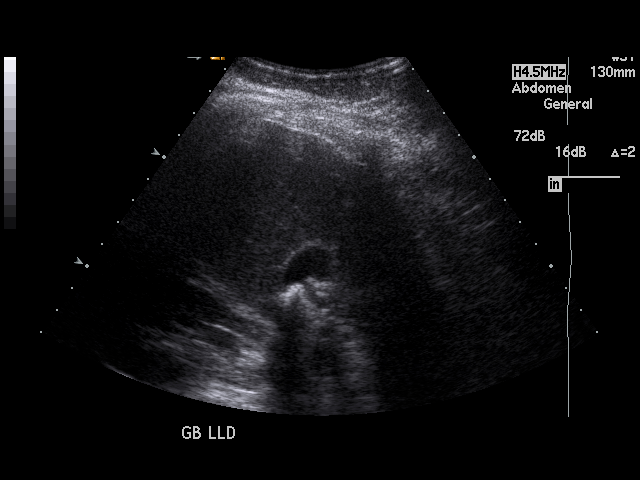
[im 64/96]
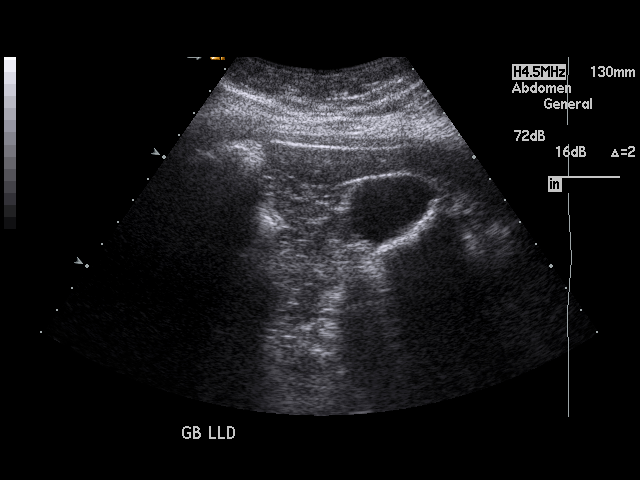
[im 72/96]
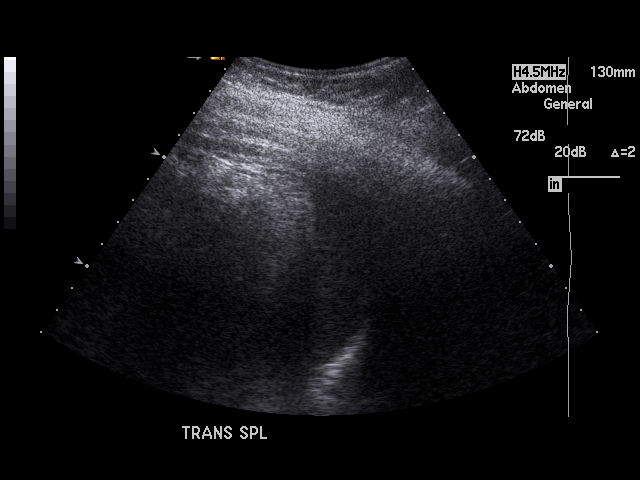
[im 76/96]
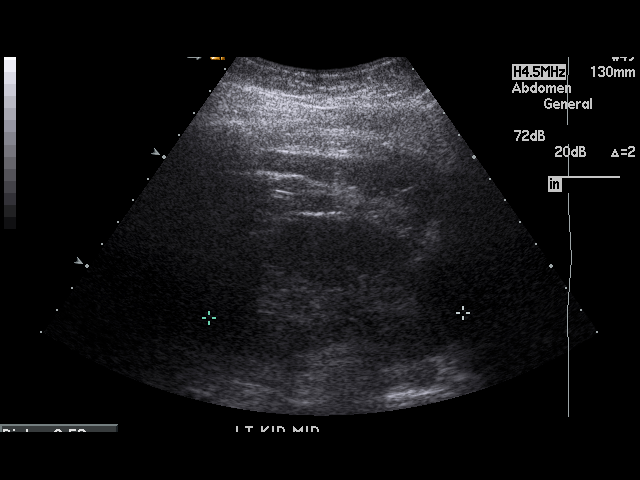
[im 84/96]
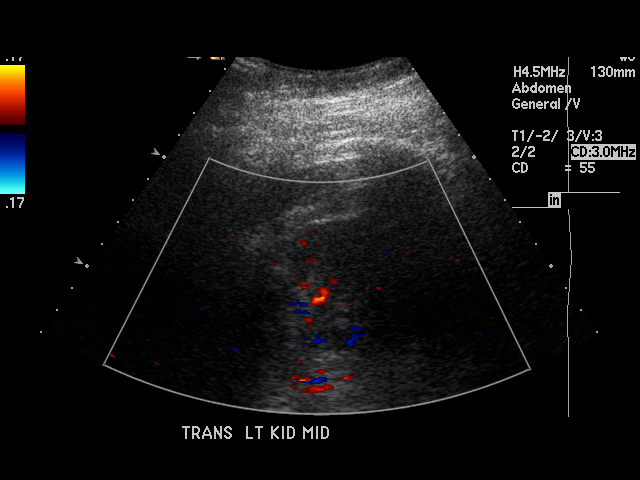
[im 88/96]
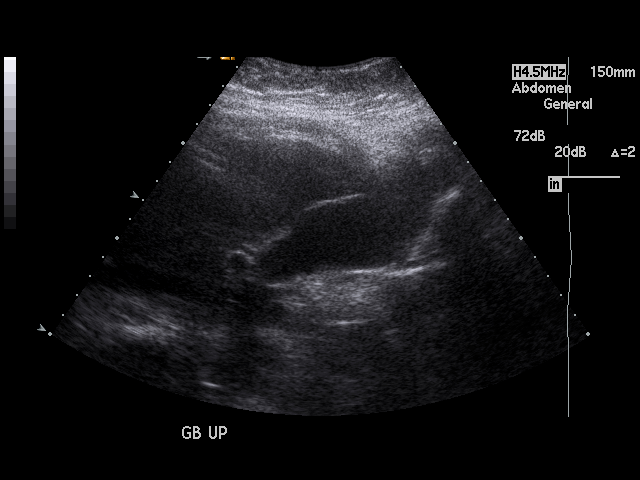
[im 96/96]
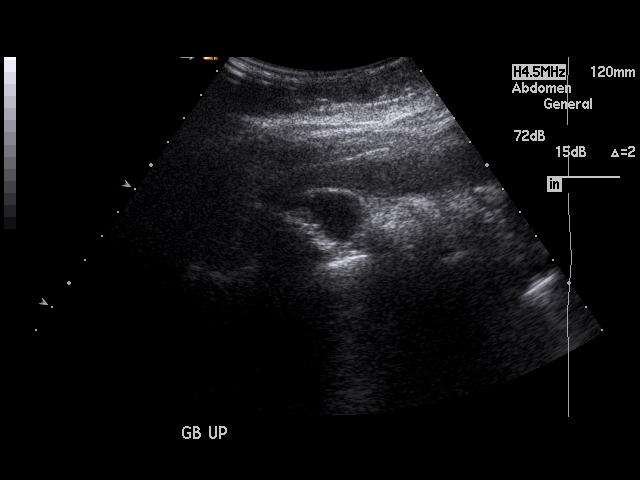

[17 of 25 positions shown; findings below may reference images not displayed]

PROCEDURE:     NAZARETH - NAZARETH ABDOMEN UPPER GENERAL  - [DATE] [DATE]

RESULT:     The liver exhibits normal echotexture with no focal mass or
ductal dilation. Portal venous flow appears to be normal in direction toward
the liver. The gallbladder is adequately distended and contains mobile
shadowing stones. There is no gallbladder wall thickening or pericholecystic
fluid or positive sonographic Murphy's sign. The common bile duct measures
6.3 mm in diameter. The head of the pancreas appear normal but the body and
tail were obscured. The spleen, inferior vena cava, and kidneys are normal
in appearance. On the proximal portion of the abdominal aorta couldn't be
adequately assessed it appeared normal.
IMPRESSION: There are multiple mobile gallstones. There is no evidence
of acute cholecystitis.

## 2011-06-24 ENCOUNTER — Ambulatory Visit: Payer: Self-pay | Admitting: Surgery

## 2011-06-24 DIAGNOSIS — R928 Other abnormal and inconclusive findings on diagnostic imaging of breast: Secondary | ICD-10-CM | POA: Diagnosis not present

## 2011-06-24 DIAGNOSIS — N6459 Other signs and symptoms in breast: Secondary | ICD-10-CM | POA: Diagnosis not present

## 2011-06-28 DIAGNOSIS — R928 Other abnormal and inconclusive findings on diagnostic imaging of breast: Secondary | ICD-10-CM | POA: Diagnosis not present

## 2011-07-11 ENCOUNTER — Ambulatory Visit: Payer: Self-pay | Admitting: Surgery

## 2011-08-16 DIAGNOSIS — F3289 Other specified depressive episodes: Secondary | ICD-10-CM | POA: Diagnosis not present

## 2011-08-16 DIAGNOSIS — F329 Major depressive disorder, single episode, unspecified: Secondary | ICD-10-CM | POA: Diagnosis not present

## 2011-08-23 DIAGNOSIS — R928 Other abnormal and inconclusive findings on diagnostic imaging of breast: Secondary | ICD-10-CM | POA: Diagnosis not present

## 2011-09-08 IMAGING — CR US BIOPSY BREAST CORE VACUUM ASSIST
7 of 8 series · 7 of 8 positions shown · non-contrast
Comparison: none

REASON FOR EXAM: lt brst nodule
COMMENTS:

PROCEDURE:     MAM - MAM STEREOTACTIC VACUUM ASSIST L  - [DATE] [DATE]
RESULT:     FINDINGS: A stereotactic breast biopsy was planned. However, the
patient was unable to tolerate the positioning and was upset with the
intended procedure plans. The study was aborted.

[CC (1 of 7)]
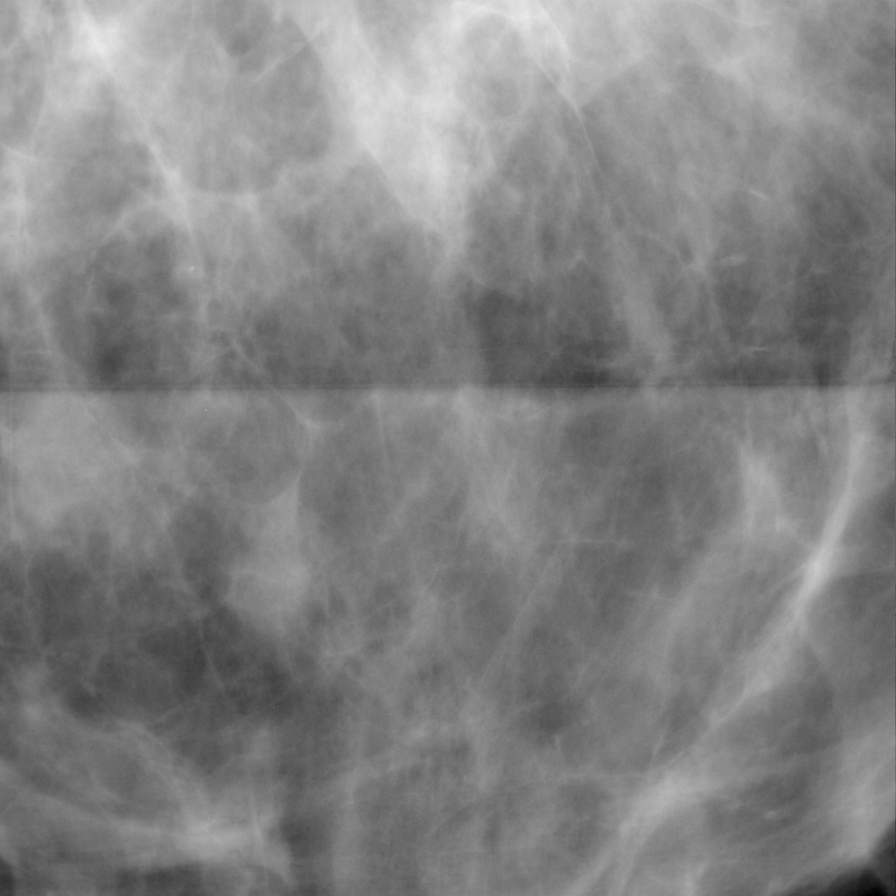

[CC (2 of 7)]
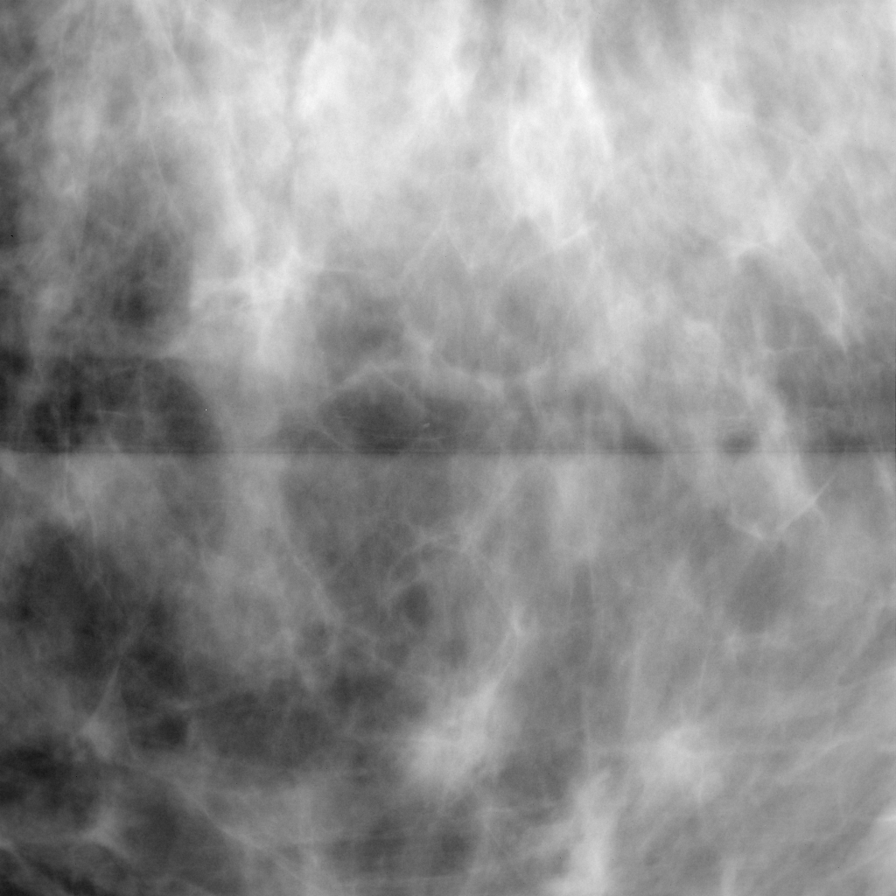

[CC (3 of 7)]
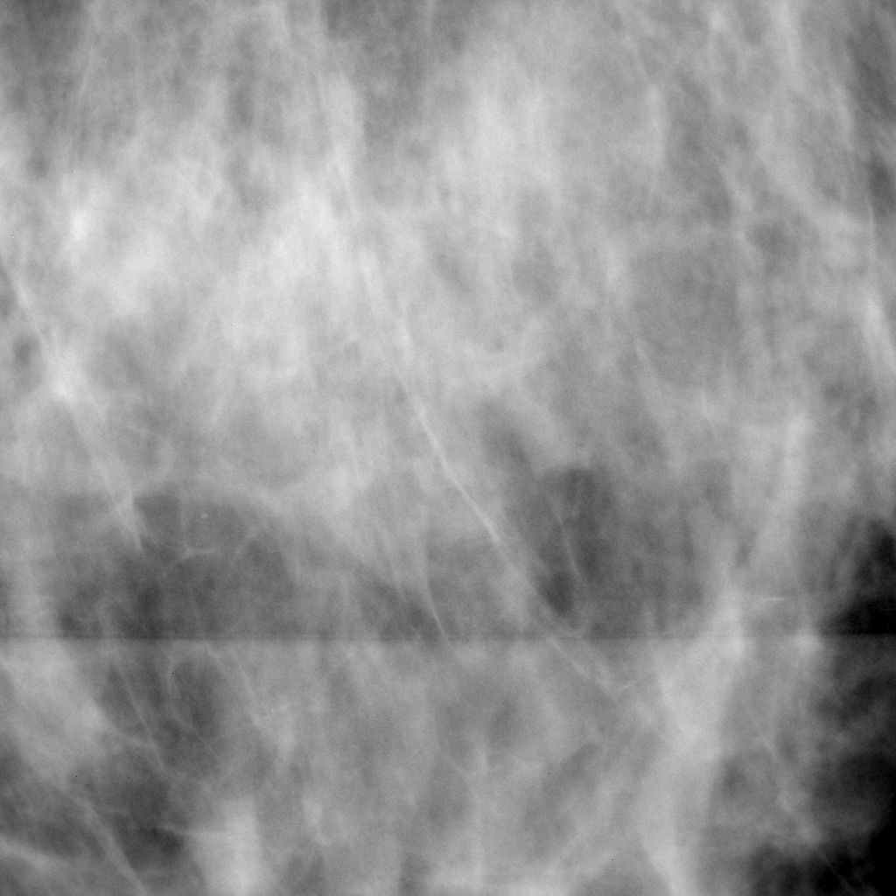

[CC (4 of 7)]
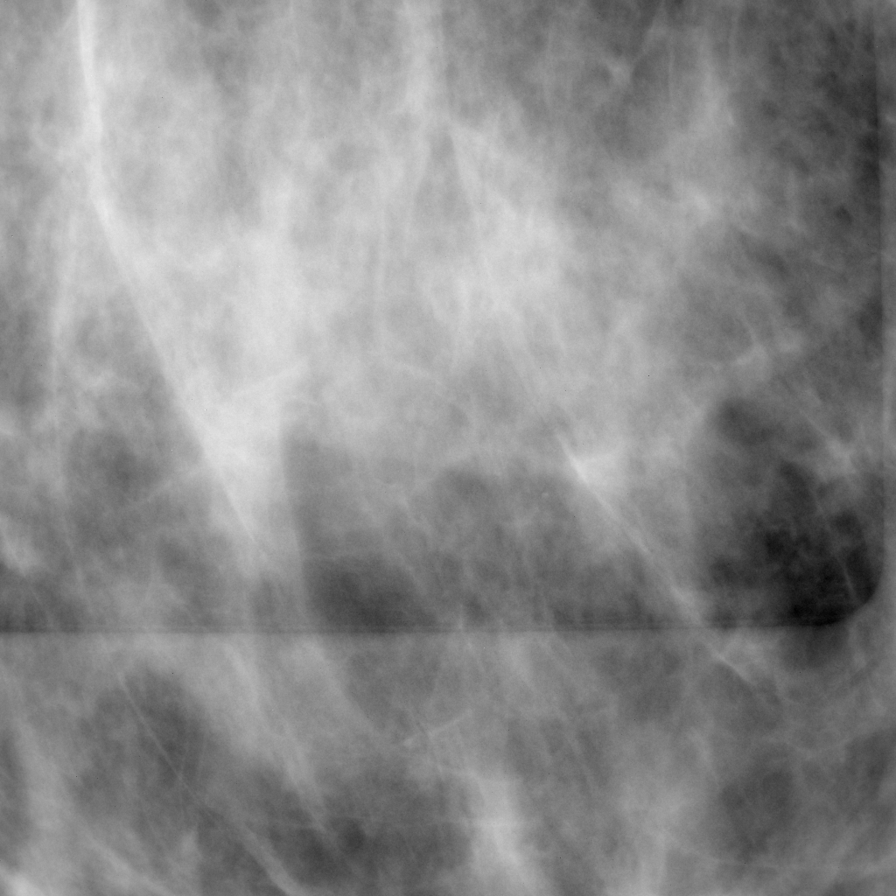

[CC (5 of 7)]
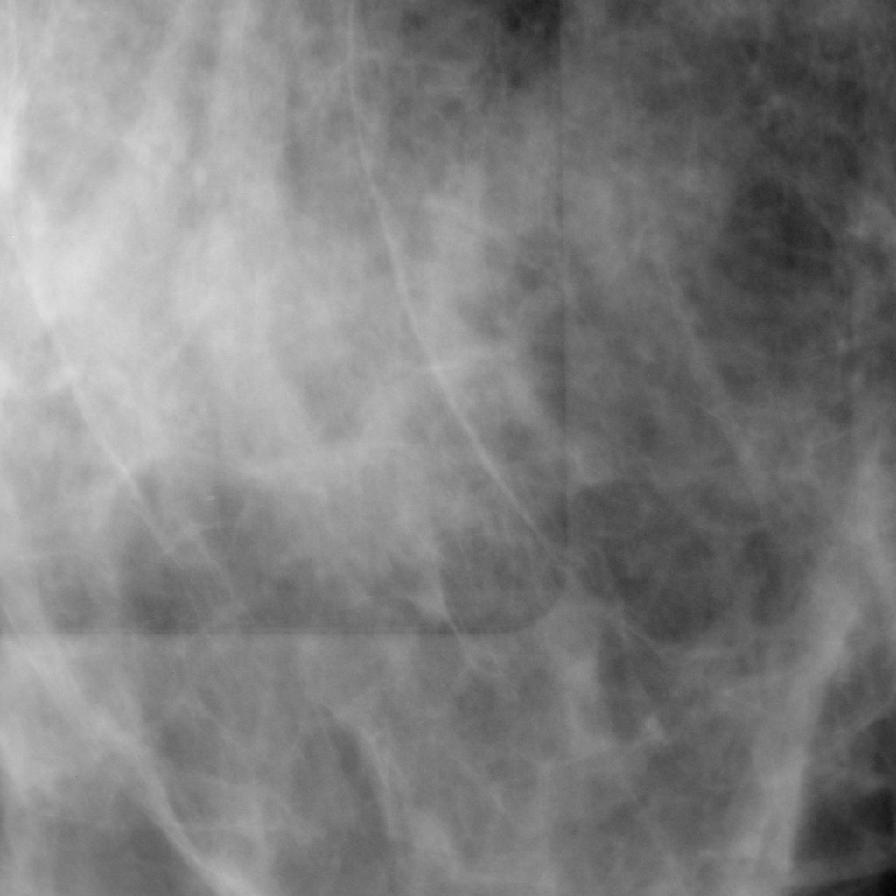

[CC (6 of 7)]
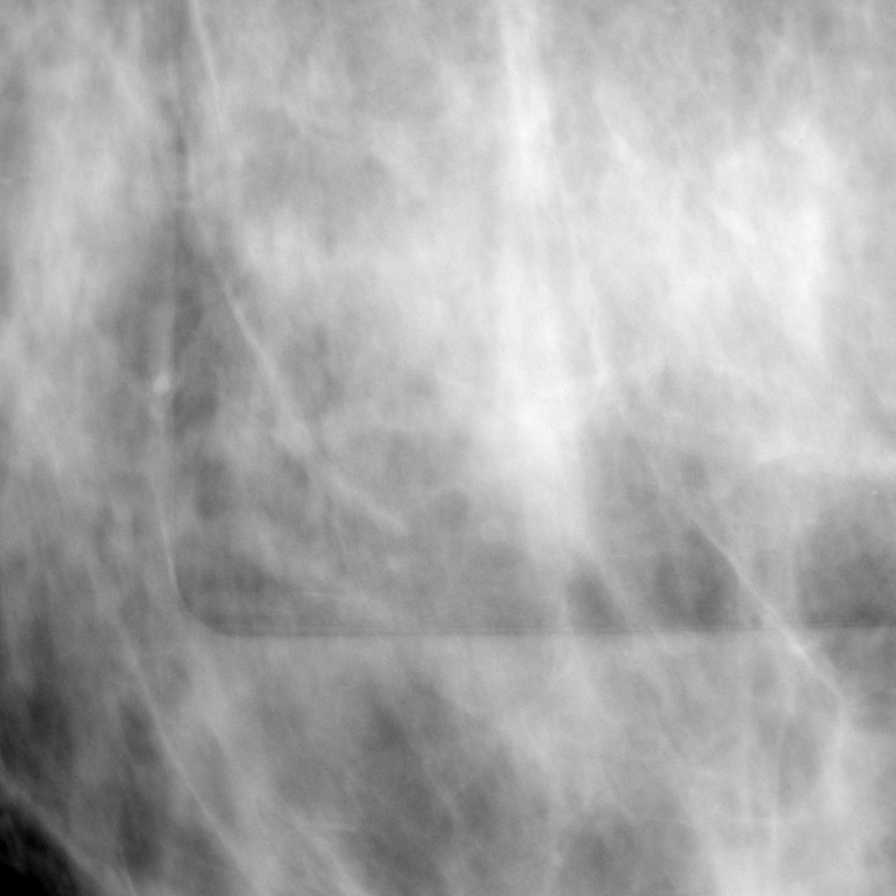

[CC (7 of 7)]
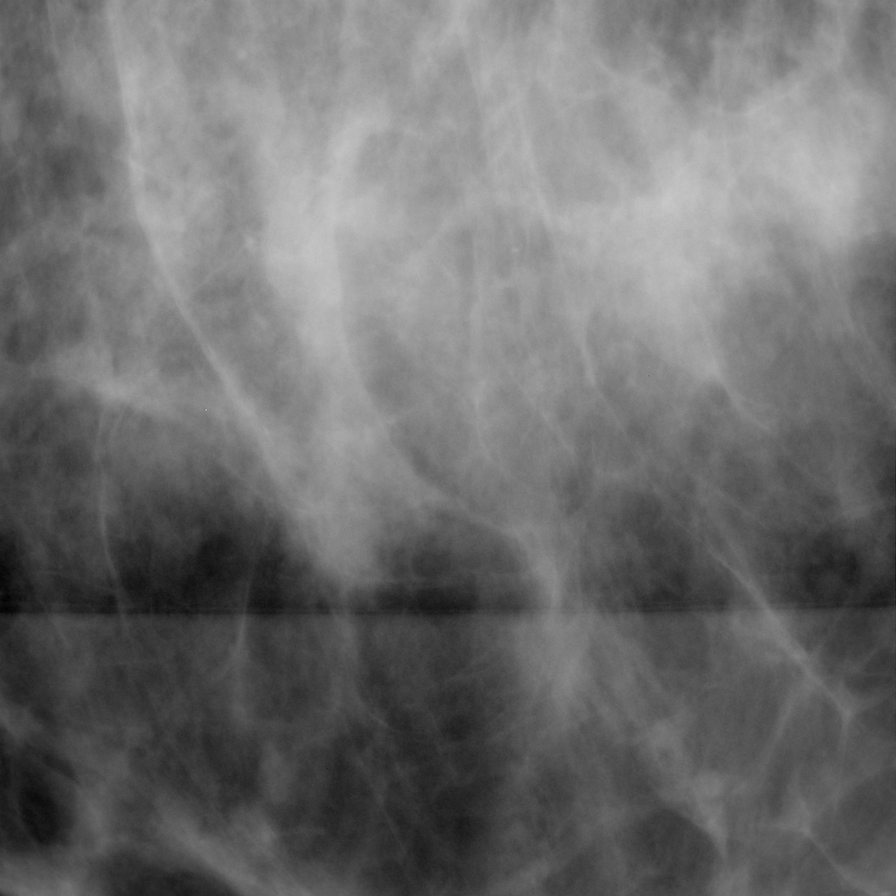

[7 of 8 positions shown; findings below may reference images not displayed]

IMPRESSION: Aborted stereotactic biopsy as described above. Recommend a
6 month followup of the left breast. The patient is due for bilateral
mammography in [DATE]. There is no charge for this study.

## 2011-10-23 DIAGNOSIS — J209 Acute bronchitis, unspecified: Secondary | ICD-10-CM | POA: Diagnosis not present

## 2011-10-23 DIAGNOSIS — J309 Allergic rhinitis, unspecified: Secondary | ICD-10-CM | POA: Diagnosis not present

## 2011-11-07 DIAGNOSIS — F70 Mild intellectual disabilities: Secondary | ICD-10-CM | POA: Diagnosis not present

## 2011-11-08 DIAGNOSIS — I10 Essential (primary) hypertension: Secondary | ICD-10-CM | POA: Diagnosis not present

## 2011-11-08 DIAGNOSIS — F411 Generalized anxiety disorder: Secondary | ICD-10-CM | POA: Diagnosis not present

## 2011-11-08 DIAGNOSIS — F70 Mild intellectual disabilities: Secondary | ICD-10-CM | POA: Diagnosis not present

## 2011-11-08 DIAGNOSIS — E785 Hyperlipidemia, unspecified: Secondary | ICD-10-CM | POA: Diagnosis not present

## 2011-11-10 DIAGNOSIS — E785 Hyperlipidemia, unspecified: Secondary | ICD-10-CM | POA: Diagnosis not present

## 2011-11-15 DIAGNOSIS — F3289 Other specified depressive episodes: Secondary | ICD-10-CM | POA: Diagnosis not present

## 2011-11-15 DIAGNOSIS — F329 Major depressive disorder, single episode, unspecified: Secondary | ICD-10-CM | POA: Diagnosis not present

## 2012-01-31 DIAGNOSIS — Z23 Encounter for immunization: Secondary | ICD-10-CM | POA: Diagnosis not present

## 2012-01-31 DIAGNOSIS — E785 Hyperlipidemia, unspecified: Secondary | ICD-10-CM | POA: Diagnosis not present

## 2012-01-31 DIAGNOSIS — F411 Generalized anxiety disorder: Secondary | ICD-10-CM | POA: Diagnosis not present

## 2012-01-31 DIAGNOSIS — Z1159 Encounter for screening for other viral diseases: Secondary | ICD-10-CM | POA: Diagnosis not present

## 2012-01-31 DIAGNOSIS — F70 Mild intellectual disabilities: Secondary | ICD-10-CM | POA: Diagnosis not present

## 2012-02-14 DIAGNOSIS — F329 Major depressive disorder, single episode, unspecified: Secondary | ICD-10-CM | POA: Diagnosis not present

## 2012-02-14 DIAGNOSIS — F3289 Other specified depressive episodes: Secondary | ICD-10-CM | POA: Diagnosis not present

## 2012-05-15 DIAGNOSIS — F3289 Other specified depressive episodes: Secondary | ICD-10-CM | POA: Diagnosis not present

## 2012-05-15 DIAGNOSIS — F329 Major depressive disorder, single episode, unspecified: Secondary | ICD-10-CM | POA: Diagnosis not present

## 2012-05-30 DIAGNOSIS — Z0000 Encounter for general adult medical examination without abnormal findings: Secondary | ICD-10-CM | POA: Diagnosis not present

## 2012-05-30 DIAGNOSIS — Z23 Encounter for immunization: Secondary | ICD-10-CM | POA: Diagnosis not present

## 2012-05-30 DIAGNOSIS — F09 Unspecified mental disorder due to known physiological condition: Secondary | ICD-10-CM | POA: Diagnosis not present

## 2012-05-30 DIAGNOSIS — Z1211 Encounter for screening for malignant neoplasm of colon: Secondary | ICD-10-CM | POA: Diagnosis not present

## 2012-05-30 DIAGNOSIS — I10 Essential (primary) hypertension: Secondary | ICD-10-CM | POA: Diagnosis not present

## 2012-06-07 DIAGNOSIS — I10 Essential (primary) hypertension: Secondary | ICD-10-CM | POA: Diagnosis not present

## 2012-07-05 ENCOUNTER — Ambulatory Visit: Payer: Self-pay | Admitting: Family Medicine

## 2012-07-05 DIAGNOSIS — N6459 Other signs and symptoms in breast: Secondary | ICD-10-CM | POA: Diagnosis not present

## 2012-08-14 DIAGNOSIS — F329 Major depressive disorder, single episode, unspecified: Secondary | ICD-10-CM | POA: Diagnosis not present

## 2012-08-14 DIAGNOSIS — F3289 Other specified depressive episodes: Secondary | ICD-10-CM | POA: Diagnosis not present

## 2012-09-07 DIAGNOSIS — R928 Other abnormal and inconclusive findings on diagnostic imaging of breast: Secondary | ICD-10-CM | POA: Diagnosis not present

## 2012-11-29 DIAGNOSIS — I10 Essential (primary) hypertension: Secondary | ICD-10-CM | POA: Diagnosis not present

## 2012-11-29 DIAGNOSIS — Z23 Encounter for immunization: Secondary | ICD-10-CM | POA: Diagnosis not present

## 2012-11-29 DIAGNOSIS — F411 Generalized anxiety disorder: Secondary | ICD-10-CM | POA: Diagnosis not present

## 2012-11-29 DIAGNOSIS — E785 Hyperlipidemia, unspecified: Secondary | ICD-10-CM | POA: Diagnosis not present

## 2012-12-06 DIAGNOSIS — E785 Hyperlipidemia, unspecified: Secondary | ICD-10-CM | POA: Diagnosis not present

## 2012-12-06 DIAGNOSIS — I10 Essential (primary) hypertension: Secondary | ICD-10-CM | POA: Diagnosis not present

## 2013-01-09 ENCOUNTER — Ambulatory Visit: Payer: Self-pay | Admitting: Surgery

## 2013-01-09 DIAGNOSIS — R928 Other abnormal and inconclusive findings on diagnostic imaging of breast: Secondary | ICD-10-CM | POA: Diagnosis not present

## 2013-01-09 DIAGNOSIS — N63 Unspecified lump in unspecified breast: Secondary | ICD-10-CM | POA: Diagnosis not present

## 2013-02-12 DIAGNOSIS — F329 Major depressive disorder, single episode, unspecified: Secondary | ICD-10-CM | POA: Diagnosis not present

## 2013-02-12 DIAGNOSIS — F3289 Other specified depressive episodes: Secondary | ICD-10-CM | POA: Diagnosis not present

## 2013-06-18 DIAGNOSIS — Z0000 Encounter for general adult medical examination without abnormal findings: Secondary | ICD-10-CM | POA: Diagnosis not present

## 2013-06-18 DIAGNOSIS — Z1211 Encounter for screening for malignant neoplasm of colon: Secondary | ICD-10-CM | POA: Diagnosis not present

## 2013-08-01 ENCOUNTER — Ambulatory Visit: Payer: Self-pay | Admitting: Family Medicine

## 2013-08-01 DIAGNOSIS — Z1231 Encounter for screening mammogram for malignant neoplasm of breast: Secondary | ICD-10-CM | POA: Diagnosis not present

## 2013-08-13 DIAGNOSIS — F329 Major depressive disorder, single episode, unspecified: Secondary | ICD-10-CM | POA: Diagnosis not present

## 2013-08-13 DIAGNOSIS — F3289 Other specified depressive episodes: Secondary | ICD-10-CM | POA: Diagnosis not present

## 2013-09-20 ENCOUNTER — Ambulatory Visit (INDEPENDENT_AMBULATORY_CARE_PROVIDER_SITE_OTHER): Payer: Medicare Other | Admitting: Podiatry

## 2013-09-20 ENCOUNTER — Encounter: Payer: Self-pay | Admitting: Podiatry

## 2013-09-20 VITALS — BP 68/44 | HR 98 | Resp 16

## 2013-09-20 DIAGNOSIS — L600 Ingrowing nail: Secondary | ICD-10-CM

## 2013-09-20 NOTE — Patient Instructions (Addendum)

## 2013-09-20 NOTE — Progress Notes (Signed)
   Subjective:    Patient ID: Rebecca Bruce, female    DOB: 1960-09-22, 53 y.o.   MRN: [id number]  HPI Comments: Its my big toe on my left foot. Its sore. Its been like this for 1 week. Nothing has been done for it.     Review of Systems     Objective:   Physical Exam        Assessment & Plan:
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
## 2013-09-26 ENCOUNTER — Encounter: Payer: Self-pay | Admitting: Podiatry

## 2013-09-26 ENCOUNTER — Encounter: Payer: Self-pay | Admitting: *Deleted

## 2013-09-27 ENCOUNTER — Ambulatory Visit: Payer: Medicare Other | Admitting: Podiatry

## 2013-11-27 DIAGNOSIS — R0789 Other chest pain: Secondary | ICD-10-CM | POA: Diagnosis not present

## 2013-11-27 DIAGNOSIS — M940 Chondrocostal junction syndrome [Tietze]: Secondary | ICD-10-CM | POA: Diagnosis not present

## 2014-02-11 DIAGNOSIS — F329 Major depressive disorder, single episode, unspecified: Secondary | ICD-10-CM | POA: Diagnosis not present

## 2014-02-13 DIAGNOSIS — Z23 Encounter for immunization: Secondary | ICD-10-CM | POA: Diagnosis not present

## 2014-04-08 DIAGNOSIS — Z789 Other specified health status: Secondary | ICD-10-CM | POA: Diagnosis not present

## 2014-04-08 DIAGNOSIS — R4189 Other symptoms and signs involving cognitive functions and awareness: Secondary | ICD-10-CM | POA: Diagnosis not present

## 2014-04-08 DIAGNOSIS — F09 Unspecified mental disorder due to known physiological condition: Secondary | ICD-10-CM | POA: Diagnosis not present

## 2014-04-08 DIAGNOSIS — R4689 Other symptoms and signs involving appearance and behavior: Secondary | ICD-10-CM | POA: Diagnosis not present

## 2014-05-07 DIAGNOSIS — N951 Menopausal and female climacteric states: Secondary | ICD-10-CM | POA: Diagnosis not present

## 2014-05-07 DIAGNOSIS — R197 Diarrhea, unspecified: Secondary | ICD-10-CM | POA: Diagnosis not present

## 2014-05-07 DIAGNOSIS — R634 Abnormal weight loss: Secondary | ICD-10-CM | POA: Diagnosis not present

## 2014-05-07 DIAGNOSIS — G3184 Mild cognitive impairment, so stated: Secondary | ICD-10-CM | POA: Diagnosis not present

## 2014-05-07 DIAGNOSIS — F0390 Unspecified dementia without behavioral disturbance: Secondary | ICD-10-CM | POA: Diagnosis not present

## 2014-06-04 DIAGNOSIS — R5383 Other fatigue: Secondary | ICD-10-CM | POA: Diagnosis not present

## 2014-06-04 DIAGNOSIS — E0590 Thyrotoxicosis, unspecified without thyrotoxic crisis or storm: Secondary | ICD-10-CM | POA: Diagnosis not present

## 2014-06-05 DIAGNOSIS — E0590 Thyrotoxicosis, unspecified without thyrotoxic crisis or storm: Secondary | ICD-10-CM | POA: Diagnosis not present

## 2014-06-05 DIAGNOSIS — R197 Diarrhea, unspecified: Secondary | ICD-10-CM | POA: Diagnosis not present

## 2014-06-05 DIAGNOSIS — F09 Unspecified mental disorder due to known physiological condition: Secondary | ICD-10-CM | POA: Diagnosis not present

## 2014-06-09 DIAGNOSIS — K529 Noninfective gastroenteritis and colitis, unspecified: Secondary | ICD-10-CM | POA: Diagnosis not present

## 2014-06-09 DIAGNOSIS — R1013 Epigastric pain: Secondary | ICD-10-CM | POA: Diagnosis not present

## 2014-06-10 DIAGNOSIS — E0590 Thyrotoxicosis, unspecified without thyrotoxic crisis or storm: Secondary | ICD-10-CM | POA: Diagnosis not present

## 2014-06-17 ENCOUNTER — Ambulatory Visit: Payer: Self-pay | Admitting: Internal Medicine

## 2014-06-17 DIAGNOSIS — E0590 Thyrotoxicosis, unspecified without thyrotoxic crisis or storm: Secondary | ICD-10-CM | POA: Diagnosis not present

## 2014-06-17 DIAGNOSIS — E0789 Other specified disorders of thyroid: Secondary | ICD-10-CM | POA: Diagnosis not present

## 2014-06-17 IMAGING — NM NM THYROID IMAGING W/ UPTAKE SINGLE (24 HR)
1 series · 3 of 3 positions shown · non-contrast
Comparison: None.

CLINICAL DATA: Hyperthyroidism. Weight loss, diarrhea and
nervousness. TSH 0.005.

EXAM:
THYROID SCAN AND UPTAKE - 24 HOURS
TECHNIQUE: Following the per oral administration of I 123 sodium iodide, the
patient returned at 24 hours and uptake measurements were acquired
with the uptake probe centered on the neck. Thyroid imaging was
performed.
RADIOPHARMACEUTICALS:  148.2 microCuries [1X] Sodium Iodide

[Series 1000: (id) thyroid scan · 2.40mm/px · 3 of 3 slices shown]
[im 1/3]
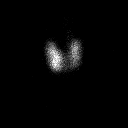
[im 2/3  full-range]
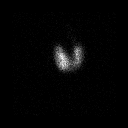
[im 3/3]
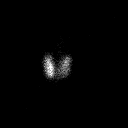

[3 of 3 positions shown; findings below may reference images not displayed]

FINDINGS: The 6 hr uptake by the thyroid gland is 38.8%. Normal uptake
is5-35%. The 24 hr uptake by the thyroid gland is 60.8%. Normal 24
hr uptake is 10-35 %.

On thyroid imaging, the thyroid activity appears diffusely
increased. No focal hot or cold nodules are identified.
IMPRESSION: Elevated radio iodine uptake with homogeneous activity consistent
with diffuse toxic goiter (Graves disease).

## 2014-06-18 DIAGNOSIS — E0590 Thyrotoxicosis, unspecified without thyrotoxic crisis or storm: Secondary | ICD-10-CM | POA: Diagnosis not present

## 2014-06-18 DIAGNOSIS — E0789 Other specified disorders of thyroid: Secondary | ICD-10-CM | POA: Diagnosis not present

## 2014-06-18 DIAGNOSIS — R197 Diarrhea, unspecified: Secondary | ICD-10-CM | POA: Diagnosis not present

## 2014-06-18 DIAGNOSIS — R634 Abnormal weight loss: Secondary | ICD-10-CM | POA: Diagnosis not present

## 2014-06-20 DIAGNOSIS — E049 Nontoxic goiter, unspecified: Secondary | ICD-10-CM | POA: Diagnosis not present

## 2014-06-24 DIAGNOSIS — Z0000 Encounter for general adult medical examination without abnormal findings: Secondary | ICD-10-CM | POA: Diagnosis not present

## 2014-06-24 DIAGNOSIS — E0590 Thyrotoxicosis, unspecified without thyrotoxic crisis or storm: Secondary | ICD-10-CM | POA: Diagnosis not present

## 2014-06-25 DIAGNOSIS — E049 Nontoxic goiter, unspecified: Secondary | ICD-10-CM | POA: Diagnosis not present

## 2014-07-23 DIAGNOSIS — E0590 Thyrotoxicosis, unspecified without thyrotoxic crisis or storm: Secondary | ICD-10-CM | POA: Diagnosis not present

## 2014-07-23 DIAGNOSIS — E049 Nontoxic goiter, unspecified: Secondary | ICD-10-CM | POA: Diagnosis not present

## 2014-08-12 DIAGNOSIS — F329 Major depressive disorder, single episode, unspecified: Secondary | ICD-10-CM | POA: Diagnosis not present

## 2014-08-13 ENCOUNTER — Other Ambulatory Visit: Payer: Self-pay | Admitting: Family Medicine

## 2014-08-13 DIAGNOSIS — Z1231 Encounter for screening mammogram for malignant neoplasm of breast: Secondary | ICD-10-CM

## 2014-08-22 DIAGNOSIS — E049 Nontoxic goiter, unspecified: Secondary | ICD-10-CM | POA: Diagnosis not present

## 2014-08-22 DIAGNOSIS — E034 Atrophy of thyroid (acquired): Secondary | ICD-10-CM | POA: Diagnosis not present

## 2014-09-01 ENCOUNTER — Ambulatory Visit
Admission: RE | Admit: 2014-09-01 | Discharge: 2014-09-01 | Disposition: A | Discharge status: Home / Self Care | Payer: Medicare Other | Source: Ambulatory Visit | Attending: Family Medicine | Admitting: Family Medicine

## 2014-09-01 DIAGNOSIS — Z1231 Encounter for screening mammogram for malignant neoplasm of breast: Secondary | ICD-10-CM | POA: Diagnosis not present

## 2014-09-01 IMAGING — MG MM DIGITAL SCREENING BILAT W/ CAD
6 series · 6 of 6 positions shown · non-contrast
Comparison: Previous exam(s).

CLINICAL DATA: Screening.

EXAM:
DIGITAL SCREENING BILATERAL MAMMOGRAM WITH CAD

[L MLO (1 of 2)]
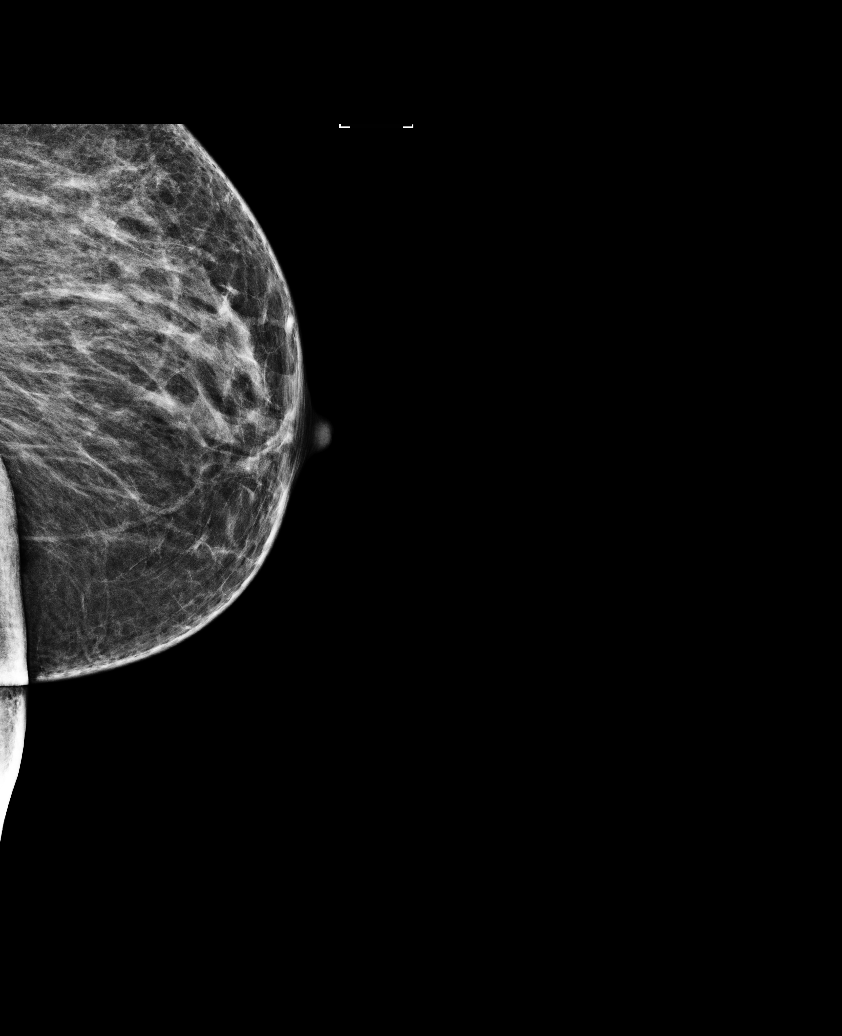

[L CC]
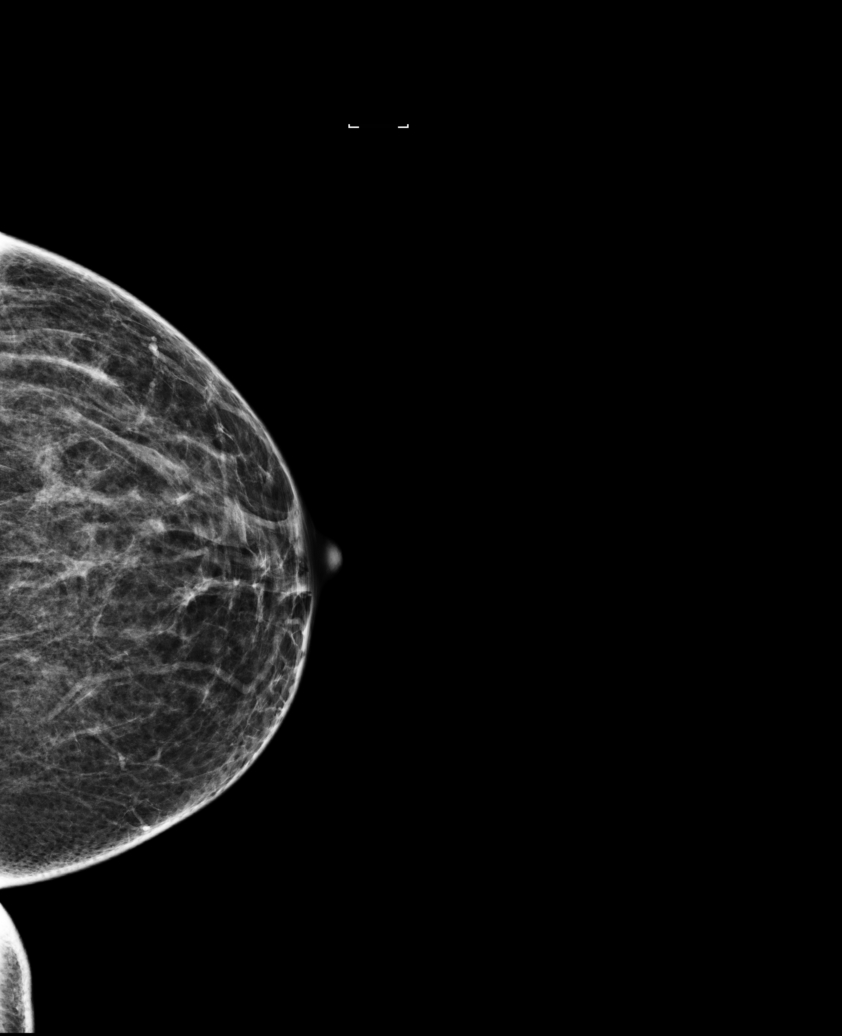

[R MLO (1 of 2)]
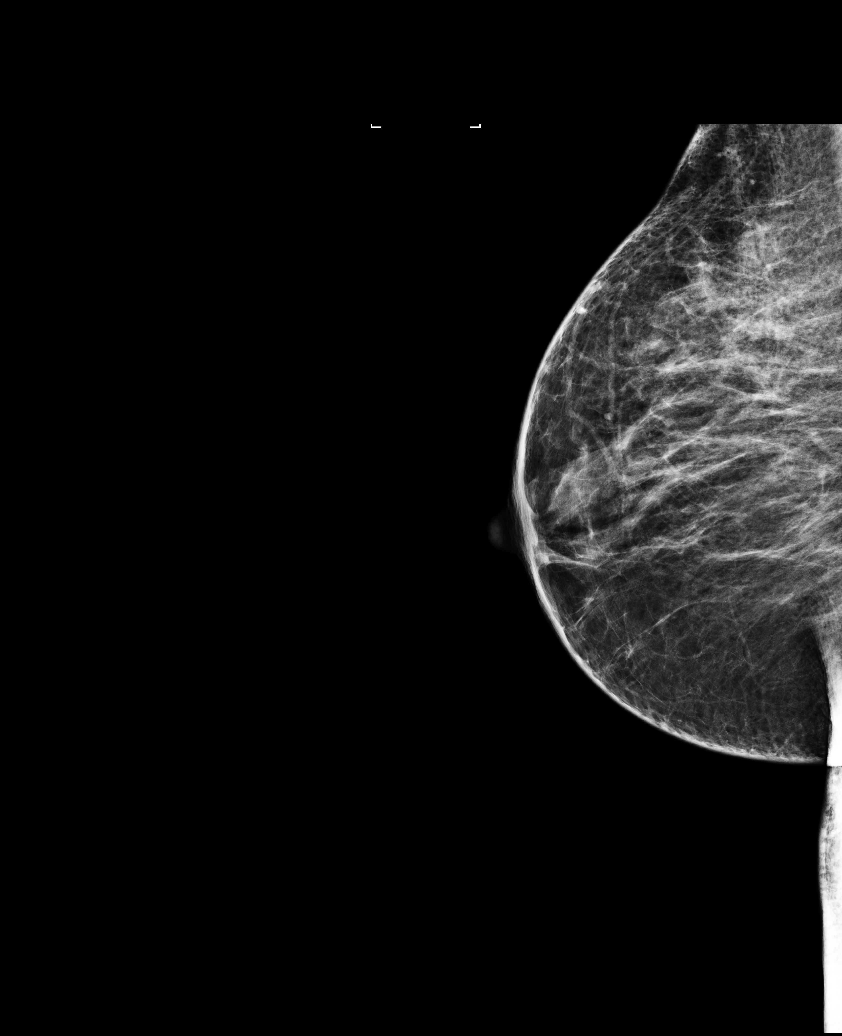

[R MLO (2 of 2)]
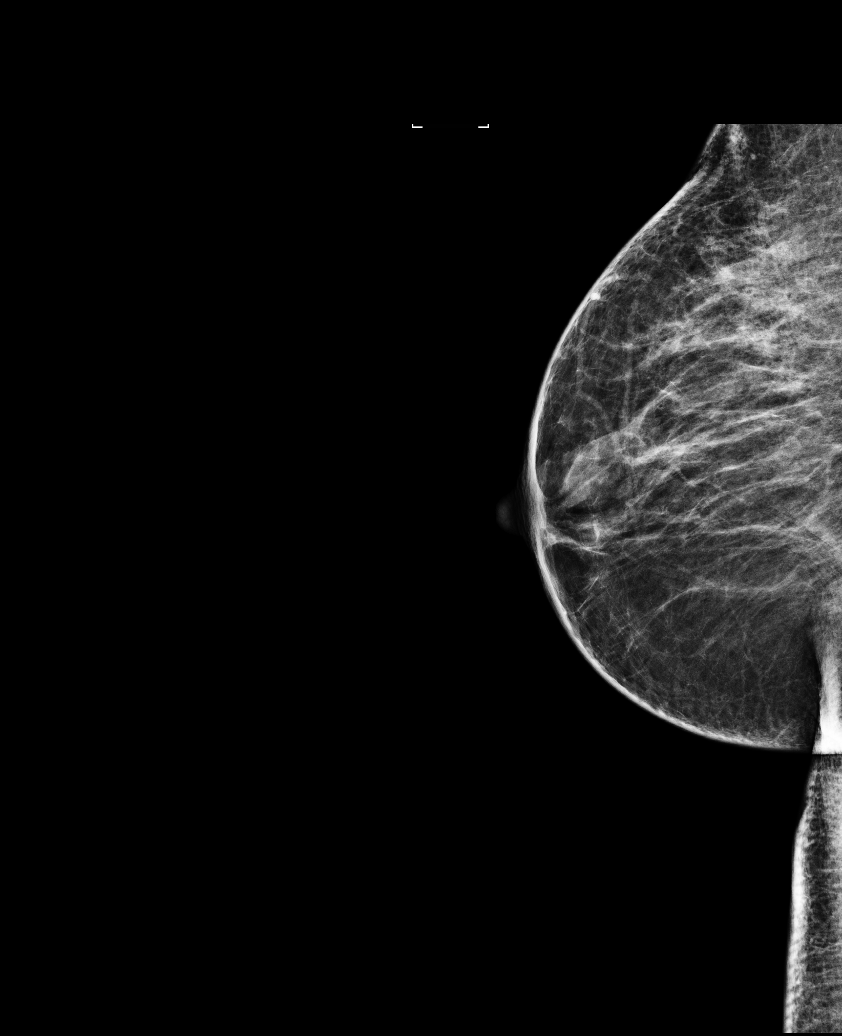

[R CC]
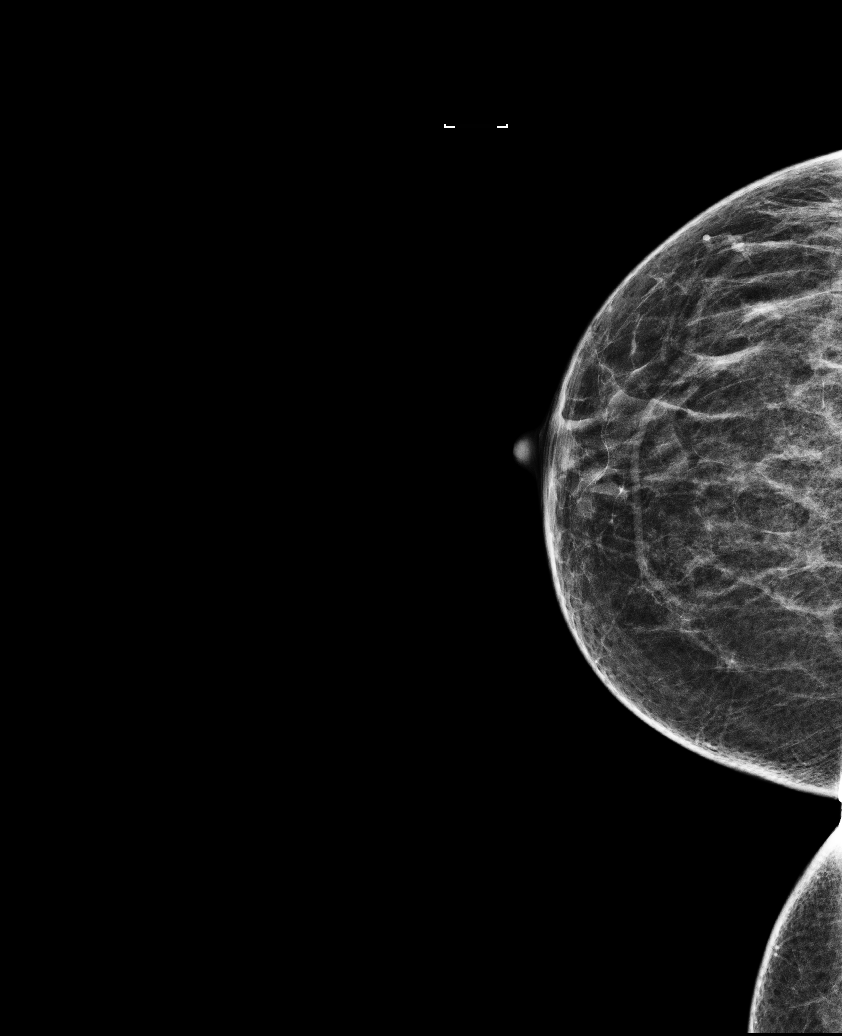

[L MLO (2 of 2)]
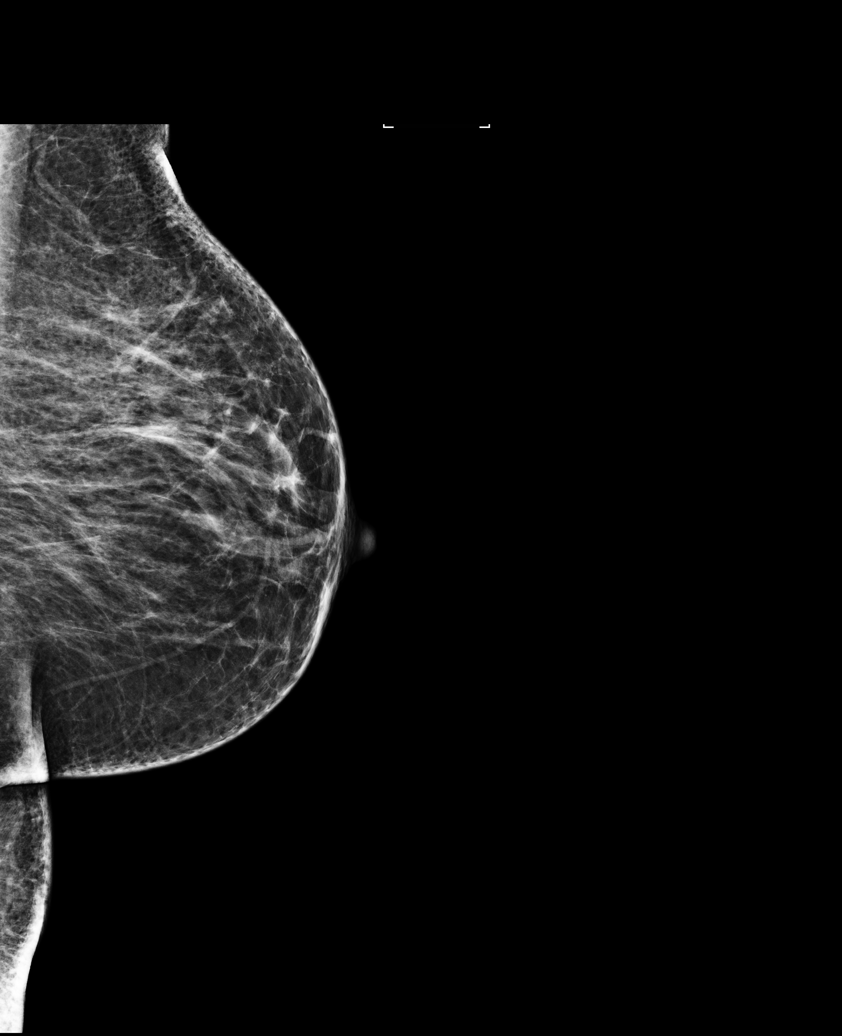

[6 of 6 positions shown; findings below may reference images not displayed]

ACR Breast Density Category b: There are scattered areas of
fibroglandular density.
FINDINGS: There are no findings suspicious for malignancy. Images were
processed with CAD.
IMPRESSION: No mammographic evidence of malignancy. A result letter of this
screening mammogram will be mailed directly to the patient.

RECOMMENDATION:
Screening mammogram in one year. (Code:[US])

BI-RADS CATEGORY  1: Negative.

## 2014-09-18 DIAGNOSIS — E039 Hypothyroidism, unspecified: Secondary | ICD-10-CM | POA: Diagnosis not present

## 2014-09-18 DIAGNOSIS — E049 Nontoxic goiter, unspecified: Secondary | ICD-10-CM | POA: Diagnosis not present

## 2014-11-18 DIAGNOSIS — E039 Hypothyroidism, unspecified: Secondary | ICD-10-CM | POA: Diagnosis not present

## 2014-11-18 DIAGNOSIS — E032 Hypothyroidism due to medicaments and other exogenous substances: Secondary | ICD-10-CM | POA: Diagnosis not present

## 2014-12-01 ENCOUNTER — Other Ambulatory Visit: Payer: Self-pay

## 2014-12-01 NOTE — Telephone Encounter (Signed)
Got a fax from [facility] requesting a refill of the Viactive soft calcium/ d chews.  Refill request was sent to Dr. Bobetta Lime for approval and submission.

## 2014-12-02 NOTE — Telephone Encounter (Signed)
I am forwarding this encounter to the designated PCP and/or their nursing staff for further management of the tasks requested. Thank you.  

## 2014-12-03 ENCOUNTER — Telehealth: Payer: Self-pay | Admitting: Emergency Medicine

## 2014-12-03 MED ORDER — CALCIUM-VITAMIN D-VITAMIN K 500-500-40 MG-UNT-MCG PO CHEW: 1.0000 | CHEWABLE_TABLET | Freq: Two times a day (BID) | ORAL | Status: DC

## 2014-12-03 NOTE — Telephone Encounter (Signed)
Spoke to [employer] phoned in

## 2014-12-16 ENCOUNTER — Encounter: Payer: Self-pay | Admitting: Family Medicine

## 2014-12-16 ENCOUNTER — Ambulatory Visit (INDEPENDENT_AMBULATORY_CARE_PROVIDER_SITE_OTHER): Payer: Medicare Other | Admitting: Family Medicine

## 2014-12-16 VITALS — BP 124/88 | HR 100 | Temp 98.7°F | Resp 16 | Ht 67.0 in | Wt 198.4 lb

## 2014-12-16 DIAGNOSIS — R4182 Altered mental status, unspecified: Secondary | ICD-10-CM

## 2014-12-16 DIAGNOSIS — R4189 Other symptoms and signs involving cognitive functions and awareness: Secondary | ICD-10-CM | POA: Diagnosis not present

## 2014-12-16 DIAGNOSIS — F919 Conduct disorder, unspecified: Secondary | ICD-10-CM | POA: Diagnosis not present

## 2014-12-16 DIAGNOSIS — E0590 Thyrotoxicosis, unspecified without thyrotoxic crisis or storm: Secondary | ICD-10-CM | POA: Diagnosis not present

## 2014-12-16 DIAGNOSIS — E785 Hyperlipidemia, unspecified: Secondary | ICD-10-CM | POA: Diagnosis not present

## 2014-12-16 DIAGNOSIS — Z23 Encounter for immunization: Secondary | ICD-10-CM

## 2014-12-16 DIAGNOSIS — R4689 Other symptoms and signs involving appearance and behavior: Secondary | ICD-10-CM

## 2014-12-16 DIAGNOSIS — R531 Weakness: Secondary | ICD-10-CM | POA: Diagnosis not present

## 2014-12-16 DIAGNOSIS — I10 Essential (primary) hypertension: Secondary | ICD-10-CM
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
## 2015-01-09 DIAGNOSIS — E0590 Thyrotoxicosis, unspecified without thyrotoxic crisis or storm: Secondary | ICD-10-CM | POA: Diagnosis not present

## 2015-01-09 DIAGNOSIS — R4189 Other symptoms and signs involving cognitive functions and awareness: Secondary | ICD-10-CM | POA: Diagnosis not present

## 2015-01-09 DIAGNOSIS — F919 Conduct disorder, unspecified: Secondary | ICD-10-CM | POA: Diagnosis not present

## 2015-01-10 LAB — COMPREHENSIVE METABOLIC PANEL
ALK PHOS: 131 IU/L — AB (ref 39–117)
ALT: 28 IU/L (ref 0–32)
AST: 25 IU/L (ref 0–40)
Albumin/Globulin Ratio: 1.3 (ref 1.1–2.5)
Albumin: 4 g/dL (ref 3.5–5.5)
BUN/Creatinine Ratio: 13 (ref 9–23)
BUN: 13 mg/dL (ref 6–24)
Bilirubin Total: 0.7 mg/dL (ref 0.0–1.2)
CO2: 26 mmol/L (ref 18–29)
Calcium: 9.8 mg/dL (ref 8.7–10.2)
Chloride: 100 mmol/L (ref 97–108)
Creatinine, Ser: 0.97 mg/dL (ref 0.57–1.00)
GFR calc Af Amer: 77 mL/min/{1.73_m2} (ref >59)
GFR calc non Af Amer: 67 mL/min/{1.73_m2} (ref >59)
GLUCOSE: 86 mg/dL (ref 65–99)
Globulin, Total: 3 g/dL (ref 1.5–4.5)
Potassium: 3.7 mmol/L (ref 3.5–5.2)
Sodium: 141 mmol/L (ref 134–144)
Total Protein: 7 g/dL (ref 6.0–8.5)

## 2015-01-10 LAB — LIPID PANEL
CHOLESTEROL TOTAL: 188 mg/dL (ref 100–199)
Chol/HDL Ratio: 2.4 ratio units (ref 0.0–4.4)
HDL: 78 mg/dL (ref >39)
LDL CALC: 96 mg/dL (ref 0–99)
Triglycerides: 68 mg/dL (ref 0–149)
VLDL CHOLESTEROL CAL: 14 mg/dL (ref 5–40)

## 2015-01-10 LAB — CBC
HEMOGLOBIN: 13.5 g/dL (ref 11.1–15.9)
Hematocrit: 40.7 % (ref 34.0–46.6)
MCH: 26.9 pg (ref 26.6–33.0)
MCHC: 33.2 g/dL (ref 31.5–35.7)
MCV: 81 fL (ref 79–97)
Platelets: 333 10*3/uL (ref 150–379)
RBC: 5.01 x10E6/uL (ref 3.77–5.28)
RDW: 14.2 % (ref 12.3–15.4)
WBC: 6.7 10*3/uL (ref 3.4–10.8)

## 2015-01-10 LAB — MAGNESIUM: Magnesium: 2 mg/dL (ref 1.6–2.3)

## 2015-01-30 ENCOUNTER — Telehealth: Payer: Self-pay | Admitting: Family Medicine

## 2015-01-30 NOTE — Telephone Encounter (Signed)
Becky from [name] is requesting that you fax the last 2 lab results to [phone]

## 2015-02-09 NOTE — Telephone Encounter (Signed)
Labs sent to [facility]

## 2015-02-10 DIAGNOSIS — F329 Major depressive disorder, single episode, unspecified: Secondary | ICD-10-CM | POA: Diagnosis not present

## 2015-02-23 DIAGNOSIS — E039 Hypothyroidism, unspecified: Secondary | ICD-10-CM | POA: Diagnosis not present

## 2015-02-23 DIAGNOSIS — E032 Hypothyroidism due to medicaments and other exogenous substances: Secondary | ICD-10-CM | POA: Diagnosis not present

## 2015-03-31 ENCOUNTER — Other Ambulatory Visit: Payer: Self-pay

## 2015-04-02 ENCOUNTER — Telehealth: Payer: Self-pay | Admitting: Family Medicine

## 2015-04-02 MED ORDER — ATORVASTATIN CALCIUM 20 MG PO TABS
20.0000 mg | ORAL_TABLET | Freq: Every day | ORAL | Status: DC
Start: 2015-04-02 — End: 2015-08-14

## 2015-04-02 NOTE — Telephone Encounter (Signed)
[facility] sent over a request for Atorvastatin last week and now the patient is completely out. Is it possible for you to refill this for dr Rutherford Nail

## 2015-04-02 NOTE — Telephone Encounter (Signed)
Went ahead and sent in 1 month for patient due to being completely out and will resend refill order to Dr. Rutherford Nail.

## 2015-04-20 ENCOUNTER — Ambulatory Visit: Payer: Medicare Other | Admitting: Family Medicine

## 2015-04-23 ENCOUNTER — Other Ambulatory Visit: Payer: Self-pay

## 2015-04-24 MED ORDER — GABAPENTIN 300 MG PO CAPS: 300.0000 mg | ORAL_CAPSULE | Freq: Every day | ORAL | Status: DC

## 2015-06-03 ENCOUNTER — Other Ambulatory Visit: Payer: Self-pay

## 2015-06-04 NOTE — Telephone Encounter (Signed)
Talked to health care provider, they will call back to schedule an appointment.

## 2015-06-09 ENCOUNTER — Other Ambulatory Visit: Payer: Self-pay

## 2015-06-10 NOTE — Telephone Encounter (Signed)
There is no Phone # in patients chart.  She will need a an appointment for any refills.

## 2015-06-18 DIAGNOSIS — E032 Hypothyroidism due to medicaments and other exogenous substances: Secondary | ICD-10-CM | POA: Diagnosis not present

## 2015-07-02 ENCOUNTER — Encounter: Payer: Self-pay | Admitting: Family Medicine

## 2015-07-02 ENCOUNTER — Ambulatory Visit (INDEPENDENT_AMBULATORY_CARE_PROVIDER_SITE_OTHER): Payer: Medicare Other | Admitting: Family Medicine

## 2015-07-02 VITALS — BP 126/74 | HR 94 | Temp 97.8°F | Resp 16 | Ht 67.0 in | Wt 209.8 lb

## 2015-07-02 DIAGNOSIS — Z0000 Encounter for general adult medical examination without abnormal findings: Secondary | ICD-10-CM | POA: Diagnosis not present

## 2015-07-02 DIAGNOSIS — G4700 Insomnia, unspecified: Secondary | ICD-10-CM | POA: Insufficient documentation

## 2015-07-02 DIAGNOSIS — F72 Severe intellectual disabilities: Secondary | ICD-10-CM | POA: Diagnosis not present

## 2015-07-02 DIAGNOSIS — Z1239 Encounter for other screening for malignant neoplasm of breast: Secondary | ICD-10-CM | POA: Diagnosis not present

## 2015-07-02 DIAGNOSIS — Z124 Encounter for screening for malignant neoplasm of cervix: Secondary | ICD-10-CM

## 2015-07-02 DIAGNOSIS — Z1211 Encounter for screening for malignant neoplasm of colon: Secondary | ICD-10-CM | POA: Diagnosis not present

## 2015-07-02 DIAGNOSIS — E039 Hypothyroidism, unspecified: Secondary | ICD-10-CM | POA: Insufficient documentation

## 2015-07-02 DIAGNOSIS — E669 Obesity, unspecified: Secondary | ICD-10-CM | POA: Diagnosis not present

## 2015-07-02 DIAGNOSIS — F419 Anxiety disorder, unspecified: Secondary | ICD-10-CM | POA: Insufficient documentation

## 2015-07-02 DIAGNOSIS — E559 Vitamin D deficiency, unspecified: Secondary | ICD-10-CM | POA: Insufficient documentation

## 2015-07-02 DIAGNOSIS — E785 Hyperlipidemia, unspecified: Secondary | ICD-10-CM | POA: Insufficient documentation

## 2015-07-02 DIAGNOSIS — Z923 Personal history of irradiation: Secondary | ICD-10-CM

## 2015-07-02 DIAGNOSIS — I10 Essential (primary) hypertension: Secondary | ICD-10-CM

## 2015-07-02 DIAGNOSIS — M1710 Unilateral primary osteoarthritis, unspecified knee: Secondary | ICD-10-CM | POA: Insufficient documentation

## 2015-07-02 DIAGNOSIS — K219 Gastro-esophageal reflux disease without esophagitis: Secondary | ICD-10-CM | POA: Insufficient documentation

## 2015-07-02 DIAGNOSIS — Z8639 Personal history of other endocrine, nutritional and metabolic disease: Secondary | ICD-10-CM | POA: Insufficient documentation

## 2015-07-02 DIAGNOSIS — M179 Osteoarthritis of knee, unspecified: Secondary | ICD-10-CM | POA: Insufficient documentation

## 2015-07-02 NOTE — Progress Notes (Signed)
Name: Rebecca Bruce   MRN: [id number]    DOB: December 12, 1960   Date:07/02/2015       Progress Note  Subjective  Chief Complaint  Chief Complaint  Patient presents with  . Annual Exam    Patient needs FL2 paper work filled out    HPI  Functional ability/safety issues: She depends on caregiver for medication management, needs assistance bathing and dressing, transportation, lives in a group home, brother is the guardian.  Hearing issues: Addressed  Activities of daily living: Discussed Home safety issues: No Issues  End Of Life Planning: Offered verbal information regarding advanced directives, healthcare power of attorney - discussed importance of having it done.   Preventative care, Health maintenance, Preventative health measures discussed.  Preventative screenings discussed today: lab work, colonoscopy - gets violent when having procedures done, we will try Cologuard., mammogram, DEXA.  Low Dose CT Chest recommended if Age 71-80 years, 30 pack-year currently smoking OR have quit w/in 15years.   Lifestyle risk factor issued reviewed: Diet, exercise, weight management, advised patient smoking is not healthy, nutrition/diet.  Preventative health measures discussed (5-10 year plan).  Reviewed and recommended vaccinations: - Pneumovax  - Prevnar  - Annual Influenza - Zostavax - Tdap   Depression screening: Done Fall risk screening: Done Discuss ADLs/IADLs: Done  Current medical providers: See HPI  Other health risk factors identified this visit: No other issues Cognitive impairment issues: None identified  All above discussed with patient. Appropriate education, counseling and referral will be made based upon the above.   HTN: taking medication as prescribed, no chest pain or palpitation  Hyperlipidemia: taking medication as prescribed and no problems  Hypothyroidism : seeing Dr. Rosario Jacks, s/p ablation for hyperthyroidism, gaining weight since, needs to follow a  diet  Obesity: she is gaining weight, discussed avoiding desserts.   MR: stable, sees psychiatrist for insomnia and xanax   Patient Active Problem List   Diagnosis Date Noted  . Dyslipidemia 07/02/2015  . MR (mental retardation), severe 07/02/2015  . History of hyperthyroidism 07/02/2015  . History of radioactive iodine thyroid ablation 07/02/2015  . Hypothyroidism (acquired) 07/02/2015  . Obesity 07/02/2015  . Hypertension, benign 07/02/2015  . Anxiety 07/02/2015  . Vitamin D deficiency 07/02/2015  . Insomnia 07/02/2015  . OA (osteoarthritis) of knee 07/02/2015  . GERD (gastroesophageal reflux disease) 07/02/2015    Past Surgical History  Procedure Laterality Date  . Dental surgery      No family history on file.  Social History   Social History  . Marital Status: Single    Spouse Name: N/A  . Number of Children: N/A  . Years of Education: N/A   Occupational History  . Not on file.   Social History Main Topics  . Smoking status: Never Smoker   . Smokeless tobacco: Never Used  . Alcohol Use: No  . Drug Use: No  . Sexual Activity: Not Currently   Other Topics Concern  . Not on file   Social History Narrative     Current outpatient prescriptions:  .  atorvastatin (LIPITOR) 20 MG tablet, Take 1 tablet (20 mg total) by mouth daily., Disp: 30 tablet, Rfl: 0 .  Calcium-Vitamin D-Vitamin K 500-500-40 MG-UNT-MCG CHEW, Chew 1 tablet by mouth 2 (two) times daily., Disp: 60 tablet, Rfl: 12 .  gabapentin (NEURONTIN) 300 MG capsule, Take 1 capsule (300 mg total) by mouth at bedtime., Disp: 30 capsule, Rfl: 3 .  Inositol 500 MG TABS, Take 1,000 mg by mouth 2 (two)  times daily., Disp: , Rfl:  .  levothyroxine (SYNTHROID, LEVOTHROID) 75 MCG tablet, Take 75 mcg by mouth daily before breakfast., Disp: , Rfl:  .  LORazepam (ATIVAN) 0.5 MG tablet, Take 0.5 mg by mouth daily as needed for anxiety., Disp: , Rfl:  .  meloxicam (MOBIC) 7.5 MG tablet, Take 7.5 mg by mouth 2 (two)  times daily., Disp: , Rfl:  .  omeprazole (PRILOSEC) 20 MG capsule, Take by mouth., Disp: , Rfl:  .  PARoxetine (PAXIL) 30 MG tablet, Take 30 mg by mouth daily., Disp: , Rfl:  .  triamterene-hydrochlorothiazide (MAXZIDE-25) 37.5-25 MG per tablet, Take 1 tablet by mouth daily., Disp: , Rfl:   No Known Allergies   ROS  Constitutional: Negative for fever, positive for  weight change.  Respiratory: Negative for cough and shortness of breath.   Cardiovascular: Negative for chest pain or palpitations.  Gastrointestinal: Negative for abdominal pain, no bowel changes.  Musculoskeletal: Negative for gait problem or joint swelling.  Skin: Negative for rash.  Neurological: Negative for dizziness or headache.  No other specific complaints in a complete review of systems (except as listed in HPI above).  Objective  Filed Vitals:   07/02/15 1140  BP: 126/74  Pulse: 94  Temp: 97.8 F (36.6 C)  TempSrc: Oral  Resp: 16  Height: 5\' 7"  (1.702 m)  Weight: 209 lb 12.8 oz (95.165 kg)  SpO2: 98%    Body mass index is 32.85 kg/(m^2).  Physical Exam  Constitutional: Patient appears well-developed and well-nourished. No distress.  HENT: Head: Normocephalic and atraumatic. Ears: B TMs ok, no erythema or effusion; Nose: Nose normal. Mouth/Throat: Oropharynx is clear and moist. No oropharyngeal exudate. No thyromegaly Eyes: Conjunctivae and EOM are normal. Pupils are equal, round, and reactive to light. No scleral icterus.  Neck: Normal range of motion. Neck supple. No JVD present. No thyromegaly present.  Cardiovascular: Normal rate, regular rhythm and normal heart sounds.  No murmur heard. No BLE edema. Pulmonary/Chest: Effort normal and breath sounds normal. No respiratory distress. Abdominal: Soft. Bowel sounds are normal, no distension. There is no tenderness. no masses Breast: refused FEMALE GENITALIA:  Not done, gets violent, behavior problems, never sexually active RECTAL: not  done Musculoskeletal: Normal range of motion, no joint effusions. Grinding with extension of both knees Neurological: he is alert and oriented to person, place, and time. No cranial nerve deficit. Coordination, balance, strength, speech and gait are normal.  Skin: Skin is warm and dry. No rash noted. No erythema.  Psychiatric: Patient has a normal mood and affect. behavior is normal. Judgment and thought content normal.  PHQ2/9: Depression screen PHQ 2/9 12/16/2014  Decreased Interest 0  Down, Depressed, Hopeless 0  PHQ - 2 Score 0    Fall Risk: Fall Risk  07/02/2015 12/16/2014  Falls in the past year? No No    Functional Status Survey: Is the patient deaf or have difficulty hearing?: No Does the patient have difficulty seeing, even when wearing glasses/contacts?: No Does the patient have difficulty concentrating, remembering, or making decisions?: Yes (Mental Retardation) Does the patient have difficulty walking or climbing stairs?: No Does the patient have difficulty dressing or bathing?: Yes (Needs Assistance) Does the patient have difficulty doing errands alone such as visiting a doctor's office or shopping?: Yes (Unable to drive)    Assessment & Plan  1. Medicare annual wellness visit, subsequent  Discussed importance of 150 minutes of physical activity weekly, eat two servings of fish weekly, eat one serving  of tree nuts ( cashews, pistachios, pecans, almonds, but she has not teeth and discussed almond butter.Marland Kitchen) every other day, eat 6 servings of fruit/vegetables daily and drink plenty of water and avoid sweet beverages.   2. Dyslipidemia  Continue medication  3. MR (mental retardation), severe  stable  4. History of radioactive iodine thyroid ablation   5. Hypothyroidism (acquired)  Sees Dr. Rosario Jacks  6. Obesity  Discussed with the patient the risk posed by an increased BMI. Discussed importance of portion control, calorie counting and at least 150 minutes of  physical activity weekly. Avoid sweet beverages and drink more water. Eat at least 6 servings of fruit and vegetables daily   7. Hypertension, benign  Well controlled  8. Colon cancer screening  - Cologuard  9. Cervical cancer screening  refused  10. Breast cancer screening  - MM Digital Screening; Future

## 2015-07-11 DIAGNOSIS — R300 Dysuria: Secondary | ICD-10-CM | POA: Diagnosis not present

## 2015-08-11 DIAGNOSIS — F339 Major depressive disorder, recurrent, unspecified: Secondary | ICD-10-CM | POA: Diagnosis not present

## 2015-08-13 DIAGNOSIS — R194 Change in bowel habit: Secondary | ICD-10-CM | POA: Diagnosis not present

## 2015-08-14 ENCOUNTER — Other Ambulatory Visit: Payer: Self-pay

## 2015-08-14 NOTE — Telephone Encounter (Signed)
Patient requesting refill. 

## 2015-08-16 MED ORDER — GABAPENTIN 300 MG PO CAPS: 300.0000 mg | ORAL_CAPSULE | Freq: Every day | ORAL | Status: DC

## 2015-08-16 MED ORDER — PROBIOTIC-10 ULTIMATE PO CAPS: 1.0000 | ORAL_CAPSULE | Freq: Every day | ORAL | Status: DC

## 2015-08-16 MED ORDER — PAROXETINE HCL 30 MG PO TABS: 30.0000 mg | ORAL_TABLET | Freq: Every day | ORAL | Status: DC

## 2015-08-16 MED ORDER — TRIAMTERENE-HCTZ 37.5-25 MG PO TABS: 1.0000 | ORAL_TABLET | Freq: Every day | ORAL | Status: DC

## 2015-08-16 MED ORDER — OMEPRAZOLE 20 MG PO CPDR: 20.0000 mg | DELAYED_RELEASE_CAPSULE | Freq: Every day | ORAL | Status: DC

## 2015-08-16 MED ORDER — ATORVASTATIN CALCIUM 20 MG PO TABS: 20.0000 mg | ORAL_TABLET | Freq: Every day | ORAL | Status: DC

## 2015-08-16 MED ORDER — LEVOTHYROXINE SODIUM 75 MCG PO TABS: 75.0000 ug | ORAL_TABLET | Freq: Every day | ORAL | Status: DC

## 2015-08-16 MED ORDER — MELOXICAM 7.5 MG PO TABS: 7.5000 mg | ORAL_TABLET | Freq: Every day | ORAL | Status: DC

## 2015-08-27 ENCOUNTER — Telehealth: Payer: Self-pay | Admitting: Family Medicine

## 2015-08-27 NOTE — Telephone Encounter (Signed)
Please forward to [facility] to approve refills,pt saw her during her last visit

## 2015-08-27 NOTE — Telephone Encounter (Signed)
What medication?  She needs follow up

## 2015-08-27 NOTE — Telephone Encounter (Signed)
Requesting refills on Inosital. Please send to [employer] (f) [phone]

## 2015-08-28 NOTE — Telephone Encounter (Signed)
Can you schedule patient a medication refill follow up? Thanks

## 2015-08-31 NOTE — Telephone Encounter (Signed)
Left voice message and spoke with the pharmacy that patient need to schedule appointment

## 2015-09-01 DIAGNOSIS — R4189 Other symptoms and signs involving cognitive functions and awareness: Secondary | ICD-10-CM | POA: Insufficient documentation

## 2015-09-08 ENCOUNTER — Other Ambulatory Visit: Payer: Self-pay

## 2015-09-08 MED ORDER — INOSITOL 500 MG PO TABS: 1000.0000 mg | ORAL_TABLET | Freq: Every day | ORAL | Status: DC

## 2015-09-08 NOTE — Telephone Encounter (Signed)
Patient requesting refill. 

## 2015-09-09 NOTE — Telephone Encounter (Signed)
Group home informed.

## 2015-09-17 DIAGNOSIS — E039 Hypothyroidism, unspecified: Secondary | ICD-10-CM | POA: Diagnosis not present

## 2015-09-17 DIAGNOSIS — E032 Hypothyroidism due to medicaments and other exogenous substances: Secondary | ICD-10-CM | POA: Diagnosis not present

## 2015-09-17 LAB — TSH: TSH: 1.94 u[IU]/mL (ref <=5.90)

## 2015-09-28 ENCOUNTER — Ambulatory Visit
Admission: RE | Admit: 2015-09-28 | Payer: Medicare Other | Source: Ambulatory Visit | Admitting: Unknown Physician Specialty

## 2015-09-28 ENCOUNTER — Encounter: Admission: RE | Payer: Self-pay | Source: Ambulatory Visit

## 2015-09-28 SURGERY — COLONOSCOPY WITH PROPOFOL
Anesthesia: General

## 2015-09-29 ENCOUNTER — Other Ambulatory Visit: Payer: Self-pay

## 2015-09-29 DIAGNOSIS — Z1231 Encounter for screening mammogram for malignant neoplasm of breast: Secondary | ICD-10-CM

## 2015-10-20 ENCOUNTER — Encounter: Payer: Self-pay | Admitting: Family Medicine

## 2015-10-20 ENCOUNTER — Ambulatory Visit (INDEPENDENT_AMBULATORY_CARE_PROVIDER_SITE_OTHER): Payer: Medicare Other | Admitting: Family Medicine

## 2015-10-20 VITALS — BP 136/90 | HR 87 | Temp 97.8°F | Resp 16 | Ht 67.0 in | Wt 216.6 lb

## 2015-10-20 DIAGNOSIS — Z923 Personal history of irradiation: Secondary | ICD-10-CM | POA: Diagnosis not present

## 2015-10-20 DIAGNOSIS — E669 Obesity, unspecified: Secondary | ICD-10-CM

## 2015-10-20 DIAGNOSIS — E039 Hypothyroidism, unspecified: Secondary | ICD-10-CM | POA: Diagnosis not present

## 2015-10-20 NOTE — Patient Instructions (Addendum)
To the Group Home Cook/servers:  -Miss Butterly needs:   1- portion control ( smaller plates - no seconds ) 2- no sweet drinks ( juice/sodas or tea) - she can drink water or unsewet/diet beverages 3- needs to drink 8 ounces of water before each meal and snack 4- she can half portion of dessert  5- increase fruit and vegetables to 6 cups daily    Caregivers: take her for 30 minutes of physical activity daily - it can be done in 10 minute blocks

## 2015-10-20 NOTE — Progress Notes (Addendum)
Name: Rebecca Bruce   MRN: [id number]    DOB: [birth year]   Date:10/20/2015       Progress Note  Subjective  Chief Complaint  Chief Complaint  Patient presents with  . Medication Refill  . Weight Gain  . Orders    will have a colonoscopy in Sept at [facility]    HPI  Obesity: she was diagnosed with hyperthyroidism in 2015, her baseline weight has been in the 195 lbs range, but went down to 170's because of hyperthyroidism. She had an thyroid ablation in 04/2014 and continues to eat large portions and not exercise and has gained over 40 lbs since. Her brother Tamme Homewood - is the guardian and asked the group home to bring her in to discuss her weight ). Caregiver Melina Schools ) states she likes to eat, not drinking water and not exercising.    Patient Active Problem List   Diagnosis Date Noted  . Other symptoms and signs involving cognitive functions and awareness 09/01/2015  . Dyslipidemia 07/02/2015  . MR (mental retardation), severe 07/02/2015  . History of hyperthyroidism 07/02/2015  . History of radioactive iodine thyroid ablation 07/02/2015  . Hypothyroidism (acquired) 07/02/2015  . Obesity 07/02/2015  . Hypertension, benign 07/02/2015  . Anxiety 07/02/2015  . Vitamin D deficiency 07/02/2015  . Insomnia 07/02/2015  . OA (osteoarthritis) of knee 07/02/2015  . GERD (gastroesophageal reflux disease) 07/02/2015    Past Surgical History  Procedure Laterality Date  . Dental surgery      Family History  Problem Relation Age of Onset  . Heart disease Mother     Social History   Social History  . Marital Status: Single    Spouse Name: N/A  . Number of Children: N/A  . Years of Education: N/A   Occupational History  . Not on file.   Social History Main Topics  . Smoking status: Never Smoker   . Smokeless tobacco: Never Used  . Alcohol Use: No  . Drug Use: No  . Sexual Activity: No   Other Topics Concern  . Not on file   Social History Narrative      Current outpatient prescriptions:  .  atorvastatin (LIPITOR) 20 MG tablet, Take 1 tablet (20 mg total) by mouth daily., Disp: 30 tablet, Rfl: 5 .  Calcium-Vitamin D-Vitamin K [id number] MG-UNT-MCG CHEW, Chew 1 tablet by mouth 2 (two) times daily., Disp: 60 tablet, Rfl: 12 .  gabapentin (NEURONTIN) 300 MG capsule, Take 1 capsule (300 mg total) by mouth at bedtime., Disp: 30 capsule, Rfl: 5 .  Inositol 500 MG TABS, Take 2 tablets (1,000 mg total) by mouth at bedtime., Disp: 60 tablet, Rfl: 0 .  levothyroxine (SYNTHROID, LEVOTHROID) 75 MCG tablet, Take 1 tablet (75 mcg total) by mouth daily before breakfast., Disp: 30 tablet, Rfl: 5 .  LORazepam (ATIVAN) 0.5 MG tablet, Take 0.5 mg by mouth daily as needed for anxiety., Disp: , Rfl:  .  meloxicam (MOBIC) 7.5 MG tablet, Take 1 tablet (7.5 mg total) by mouth daily., Disp: 60 tablet, Rfl: 5 .  omeprazole (PRILOSEC) 20 MG capsule, Take 1 capsule (20 mg total) by mouth daily., Disp: 30 capsule, Rfl: 5 .  PARoxetine (PAXIL) 30 MG tablet, Take 1 tablet (30 mg total) by mouth daily., Disp: 30 tablet, Rfl: 5 .  Probiotic Product (PROBIOTIC-10 ULTIMATE) CAPS, Take 1 capsule by mouth daily., Disp: 30 capsule, Rfl: 5 .  triamterene-hydrochlorothiazide (MAXZIDE-25) 37.5-25 MG tablet, Take 1 tablet by mouth daily.,  Disp: 30 tablet, Rfl: 5  No Known Allergies   ROS  Ten systems reviewed and is negative except as mentioned in HPI    Objective  Filed Vitals:   10/20/15 1342  BP: 136/90  Pulse: 87  Temp: 97.8 F (36.6 C)  TempSrc: Oral  Resp: 16  Height: 5\' 7"  (1.702 m)  Weight: 216 lb 9.6 oz (98.249 kg)  SpO2: 93%    Body mass index is 33.92 kg/(m^2).  Physical Exam  Constitutional: Patient appears well-developed and well-nourished. Obese No distress.  HEENT: head atraumatic, normocephalic, pupils equal and reactive to light,  neck supple, throat within normal limits Cardiovascular: Normal rate, regular rhythm and normal heart sounds.   No murmur heard. No BLE edema. Pulmonary/Chest: Effort normal and breath sounds normal. No respiratory distress. Abdominal: Soft.  There is no tenderness. Psychiatric: smiles, shaking her head, she has MR, stable  Recent Results (from the past 2160 hour(s))  TSH     Status: None   Collection Time: 09/17/15 12:00 AM  Result Value Ref Range   TSH 1.94 .41 - 5.90 uIU/mL    Comment: Free T4: 1.25, Free Triodothyronine: 2.2      PHQ2/9: Depression screen [facility] 2/9 10/20/2015 12/16/2014  Decreased Interest 0 0  Down, Depressed, Hopeless 0 0  PHQ - 2 Score 0 0     Fall Risk: Fall Risk  10/20/2015 07/02/2015 12/16/2014  Falls in the past year? No No No     Functional Status Survey: Is the patient deaf or have difficulty hearing?: No Does the patient have difficulty seeing, even when wearing glasses/contacts?: No Does the patient have difficulty concentrating, remembering, or making decisions?: Yes Does the patient have difficulty walking or climbing stairs?: No Does the patient have difficulty doing errands alone such as visiting a doctor's office or shopping?: Yes    Assessment & Plan  1. Hypothyroidism (acquired)  Reviewed TSH done in June by Dr. Rosario Jacks and it was at goal   2. History of radioactive iodine thyroid ablation    3. Obesity  Discussed with the patient the risk posed by an increased BMI. Portion control .  150 minutes of physical activity weekly. Avoid sweet beverages and drink more water. Eat at least 6 servings of fruit and vegetables daily

## 2015-10-22 ENCOUNTER — Encounter: Payer: Self-pay | Admitting: Family Medicine

## 2015-10-23 ENCOUNTER — Other Ambulatory Visit: Payer: Self-pay

## 2015-10-23 ENCOUNTER — Ambulatory Visit: Payer: Medicare Other

## 2015-10-23 NOTE — Telephone Encounter (Signed)
[facility] requesting a refill of this patient's inositol 500mg .  Refill request was sent to Dr. Steele Sizer for approval and submission.

## 2015-10-30 ENCOUNTER — Ambulatory Visit
Admission: RE | Admit: 2015-10-30 | Discharge: 2015-10-30 | Disposition: A | Discharge status: Home / Self Care | Payer: Medicare Other | Source: Ambulatory Visit | Attending: Family Medicine | Admitting: Family Medicine

## 2015-10-30 DIAGNOSIS — Z1231 Encounter for screening mammogram for malignant neoplasm of breast: Secondary | ICD-10-CM | POA: Insufficient documentation

## 2015-10-30 IMAGING — MG MM DIGITAL SCREENING BILAT W/ CAD
4 series · 4 of 4 positions shown · non-contrast
Comparison: Previous exam(s).

CLINICAL DATA: Screening.

EXAM:
DIGITAL SCREENING BILATERAL MAMMOGRAM WITH CAD

[L MLO]
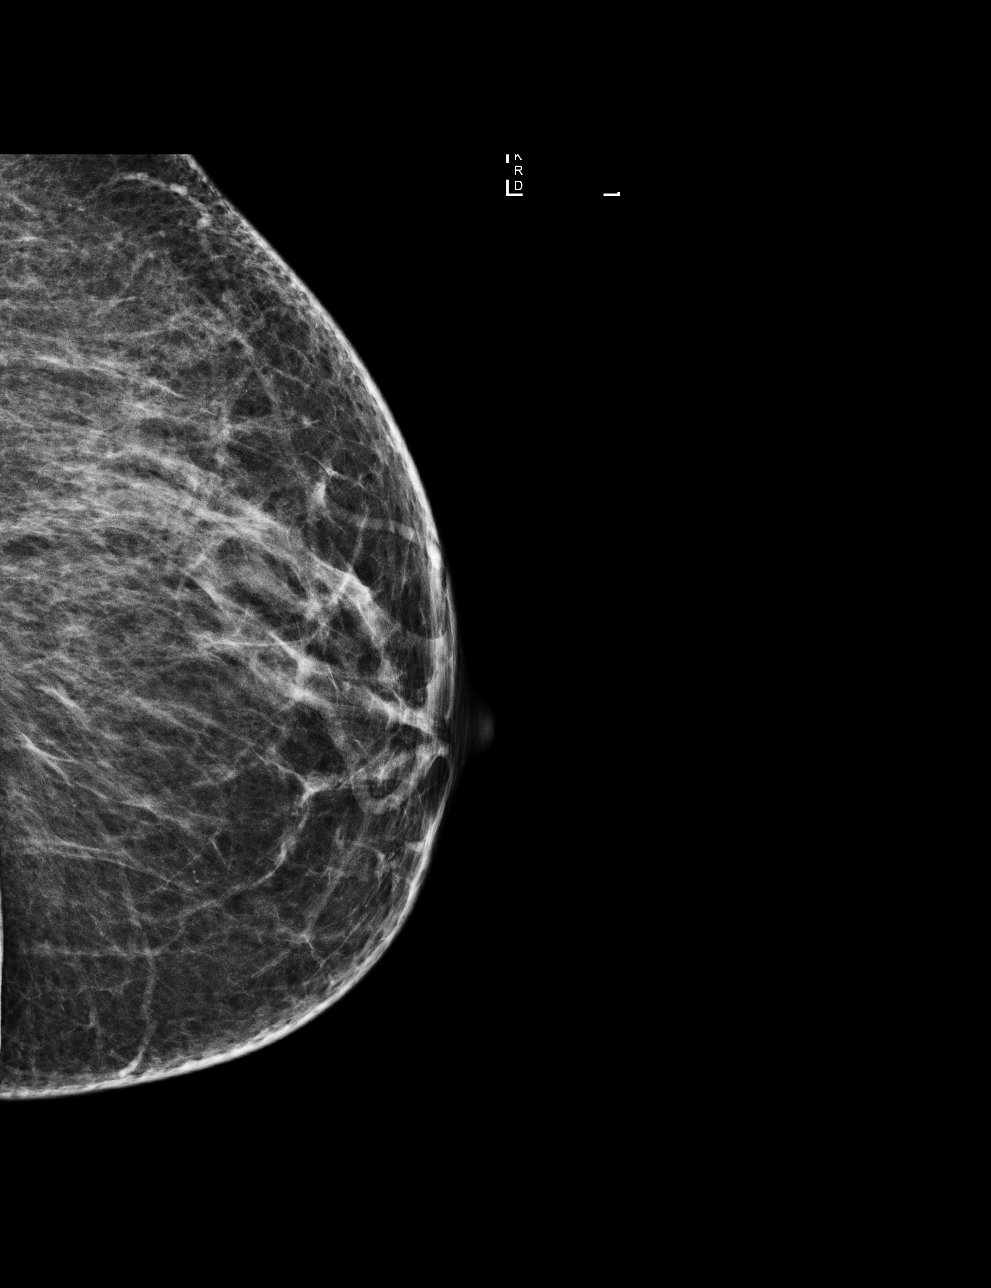

[L CC]
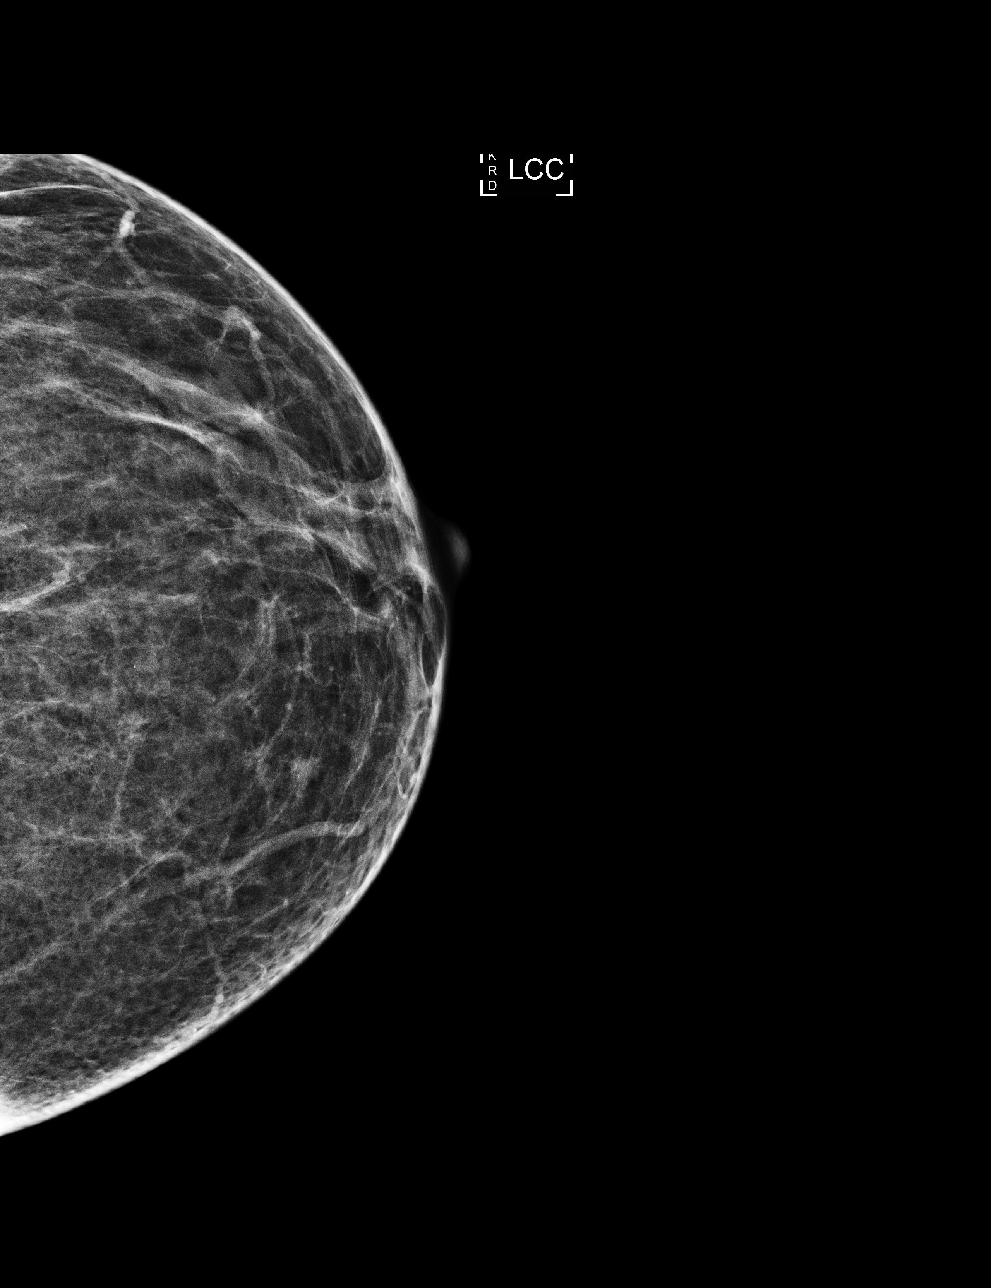

[R MLO]
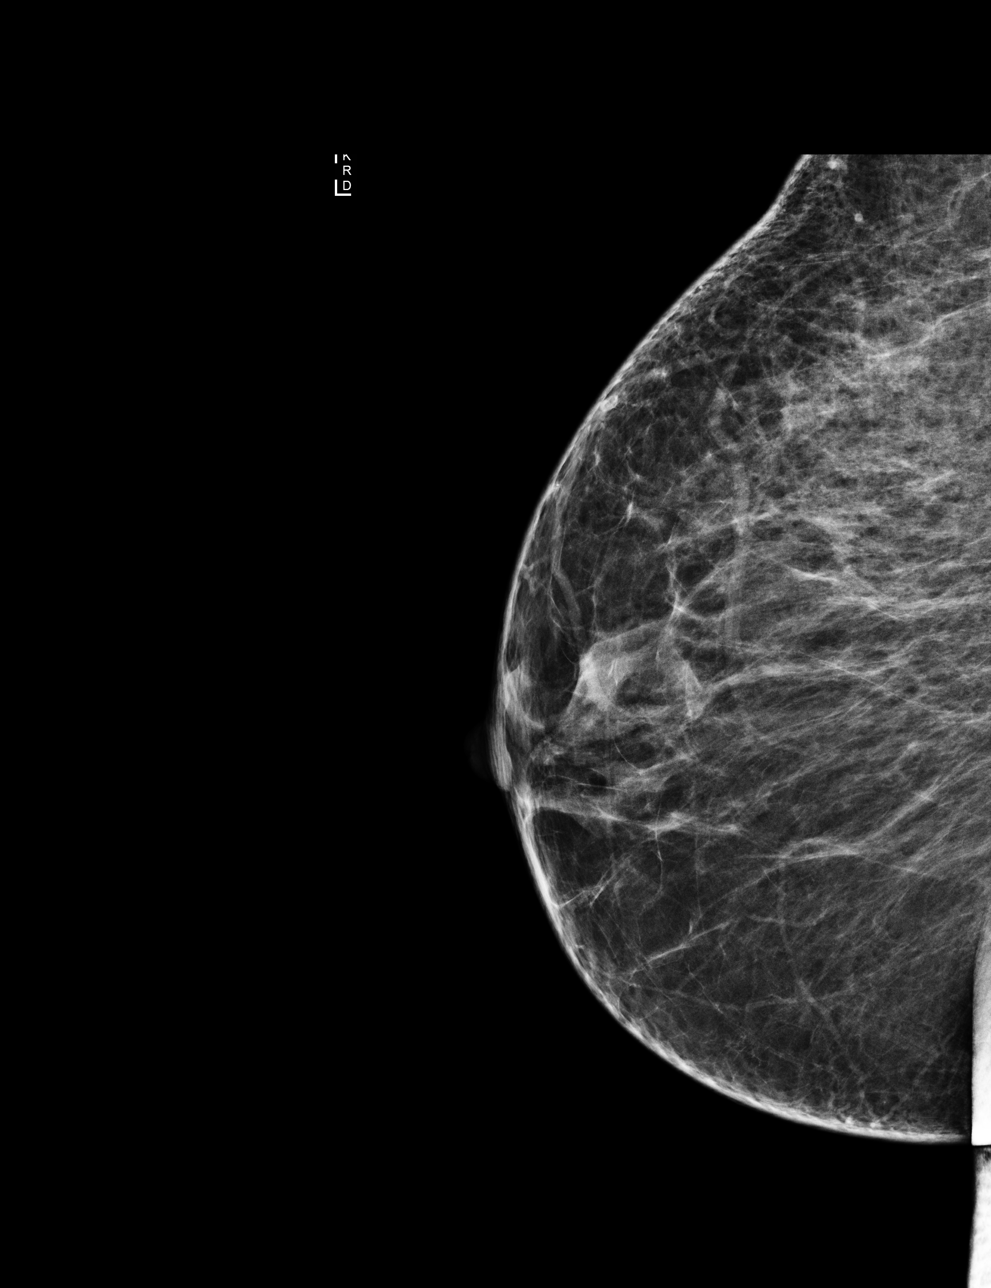

[R CC]
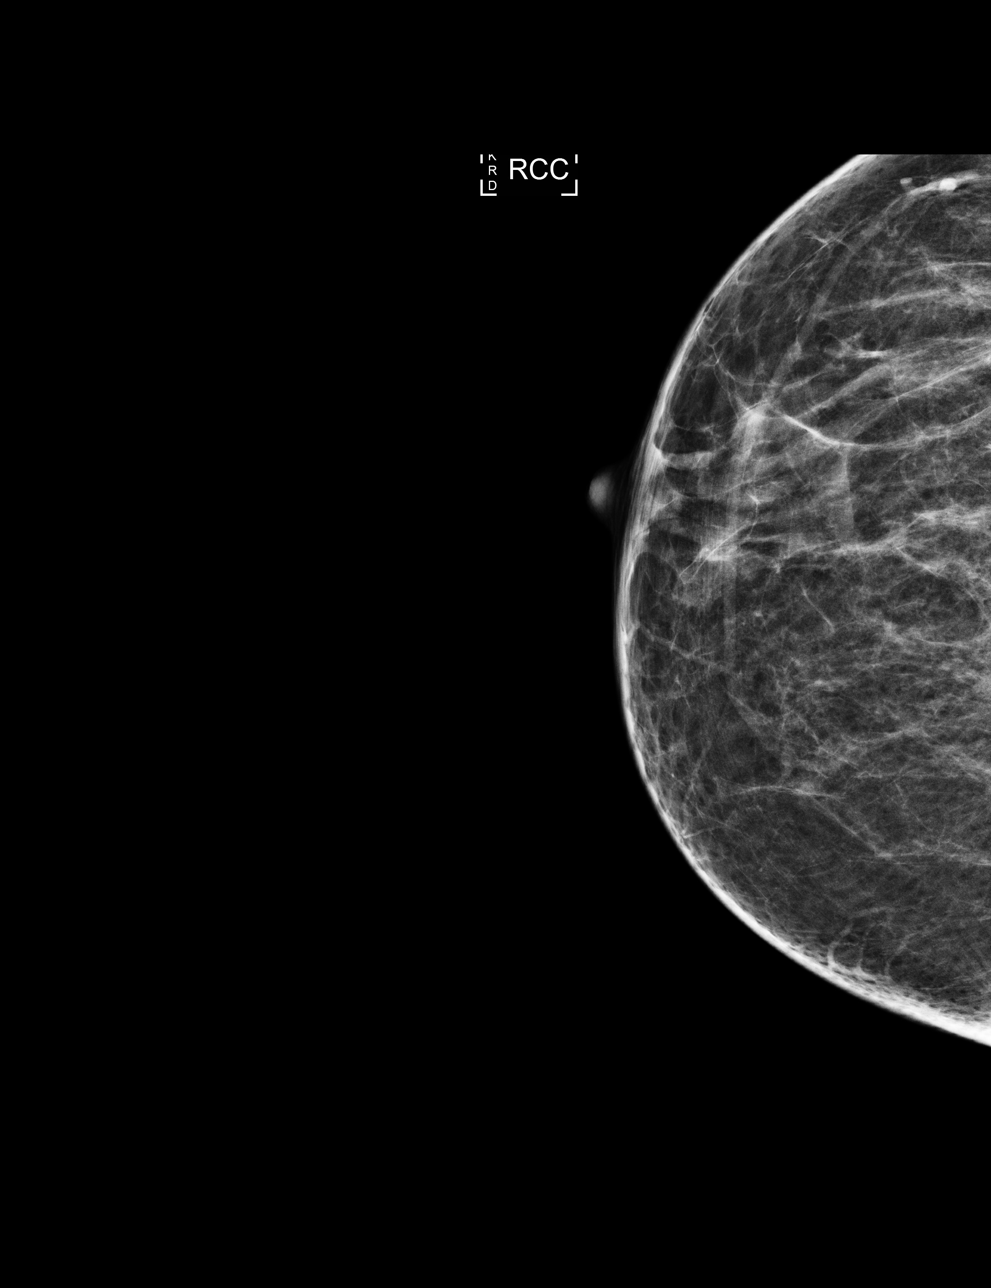

[4 of 4 positions shown; findings below may reference images not displayed]

ACR Breast Density Category b: There are scattered areas of
fibroglandular density.
FINDINGS: There are no findings suspicious for malignancy. Due to the
patient's condition, the posterior most tissues in the bilateral
breasts are not included in the field of view. Images were processed
with CAD.
IMPRESSION: No mammographic evidence of malignancy. A result letter of this
screening mammogram will be mailed directly to the patient.

RECOMMENDATION:
Screening mammogram in one year. (Code:[B0])

BI-RADS CATEGORY  1: Negative.

## 2015-11-11 ENCOUNTER — Other Ambulatory Visit: Payer: Self-pay

## 2015-11-11 MED ORDER — ATORVASTATIN CALCIUM 20 MG PO TABS
20.0000 mg | ORAL_TABLET | Freq: Every day | ORAL | 1 refills | Status: DC
Start: 2015-11-11 — End: 2016-01-20

## 2015-11-11 NOTE — Telephone Encounter (Signed)
[facility] requires prescriptions due to patient insurance be written in 90 day quantities. Please refill Atorvastatin with a 90 day supply to patient pharmacy.

## 2015-11-18 ENCOUNTER — Other Ambulatory Visit: Payer: Self-pay

## 2015-11-18 NOTE — Telephone Encounter (Signed)
Patient requesting refill of Inositol.

## 2015-11-25 ENCOUNTER — Telehealth: Payer: Self-pay | Admitting: Family Medicine

## 2015-11-25 ENCOUNTER — Other Ambulatory Visit: Payer: Self-pay

## 2015-11-25 MED ORDER — INOSITOL 324 MG PO TABS: 1.0000 | ORAL_TABLET | Freq: Every day | ORAL | 0 refills | Status: DC

## 2015-11-25 NOTE — Telephone Encounter (Signed)
Informed pharmacy to D/C Medication per Dr. Ancil Boozer.

## 2015-11-25 NOTE — Progress Notes (Unsigned)
in

## 2015-12-17 ENCOUNTER — Telehealth: Payer: Self-pay | Admitting: Family Medicine

## 2015-12-20 NOTE — Telephone Encounter (Signed)
Okay to call it in.

## 2015-12-21 ENCOUNTER — Other Ambulatory Visit: Payer: Self-pay | Admitting: Family Medicine

## 2015-12-21 NOTE — Telephone Encounter (Signed)
I am sure you can given them a verbal order?

## 2015-12-21 NOTE — Telephone Encounter (Signed)
So you want me to order Benadryl, Haldol 5mg  and 2 mg Ativan?

## 2015-12-22 NOTE — Telephone Encounter (Signed)
Spoke with Melina Schools and this was given to patient in a pill form a hour before a breast biopsy 3 years ago with Dr. Rutherford Nail. Per Dr. Ancil Boozer ok to call in 1pill each of Benadryl 50 mg, Haldol 5 mg and Ativan 2 mg. So patient is able to perform colonoscopy.

## 2015-12-29 DIAGNOSIS — Z538 Procedure and treatment not carried out for other reasons: Secondary | ICD-10-CM | POA: Diagnosis not present

## 2015-12-29 DIAGNOSIS — Z1211 Encounter for screening for malignant neoplasm of colon: Secondary | ICD-10-CM | POA: Diagnosis not present

## 2016-01-20 ENCOUNTER — Ambulatory Visit (INDEPENDENT_AMBULATORY_CARE_PROVIDER_SITE_OTHER): Payer: Medicare Other | Admitting: Family Medicine

## 2016-01-20 ENCOUNTER — Encounter: Payer: Self-pay | Admitting: Family Medicine

## 2016-01-20 VITALS — BP 124/68 | HR 86 | Temp 98.0°F | Resp 16 | Ht 67.0 in | Wt 218.4 lb

## 2016-01-20 DIAGNOSIS — E039 Hypothyroidism, unspecified: Secondary | ICD-10-CM

## 2016-01-20 DIAGNOSIS — Z23 Encounter for immunization: Secondary | ICD-10-CM

## 2016-01-20 DIAGNOSIS — Z6838 Body mass index (BMI) 38.0-38.9, adult: Secondary | ICD-10-CM | POA: Diagnosis not present

## 2016-01-20 DIAGNOSIS — E785 Hyperlipidemia, unspecified: Secondary | ICD-10-CM | POA: Diagnosis not present

## 2016-01-20 DIAGNOSIS — I10 Essential (primary) hypertension: Secondary | ICD-10-CM

## 2016-01-20 DIAGNOSIS — Z923 Personal history of irradiation: Secondary | ICD-10-CM | POA: Diagnosis not present

## 2016-01-20 DIAGNOSIS — E6609 Other obesity due to excess calories: Secondary | ICD-10-CM | POA: Diagnosis not present

## 2016-01-20 DIAGNOSIS — F72 Severe intellectual disabilities: Secondary | ICD-10-CM

## 2016-01-20 MED ORDER — TRIAMTERENE-HCTZ 37.5-25 MG PO TABS: 1.0000 | ORAL_TABLET | Freq: Every day | ORAL | 5 refills | Status: DC

## 2016-01-20 MED ORDER — LEVOTHYROXINE SODIUM 75 MCG PO TABS: 75.0000 ug | ORAL_TABLET | Freq: Every day | ORAL | 5 refills | Status: DC

## 2016-01-20 MED ORDER — ATORVASTATIN CALCIUM 20 MG PO TABS: 20.0000 mg | ORAL_TABLET | Freq: Every day | ORAL | 5 refills | Status: DC

## 2016-01-20 MED ORDER — PAROXETINE HCL 30 MG PO TABS: 30.0000 mg | ORAL_TABLET | Freq: Every day | ORAL | 5 refills | Status: DC

## 2016-01-20 NOTE — Progress Notes (Signed)
Name: Rebecca Bruce   MRN: [id number]    DOB: Aug 22, 1960   Date:01/20/2016       Progress Note  Subjective  Chief Complaint  Chief Complaint  Patient presents with  . Hyperlipidemia    3 month follow up  . Thyroid Problem  . Anxiety  . Obesity    HPI  HTN: taking medication as prescribed, no chest pain or palpitation, currently only on Triamterene HCTZ  Hyperlipidemia: taking medication as prescribed and no problems , denies myalgias  Medication management: we will stop Omeprazole, Isorbitol and Meloxicam - since she denies pain, heartburn and bp is well controlled and monitor   Hypothyroidism : seeing Dr. Rosario Jacks, s/p ablation for hyperthyroidism, gaining weight since, needs to follow a low fat diet and exercise more if possible  Obesity: she is gaining weight, discussed avoiding desserts, and portion control.   MR: stable, sees psychiatrist for insomnia and xanax  Colonoscopy : she had the prep, but refused to have procedure done, explained we can try cologuard but otherwise she will be unable to have it done.   Patient Active Problem List   Diagnosis Date Noted  . Other symptoms and signs involving cognitive functions and awareness 09/01/2015  . Dyslipidemia 07/02/2015  . MR (mental retardation), severe 07/02/2015  . History of hyperthyroidism 07/02/2015  . History of radioactive iodine thyroid ablation 07/02/2015  . Hypothyroidism (acquired) 07/02/2015  . Obesity 07/02/2015  . Hypertension, benign 07/02/2015  . Anxiety 07/02/2015  . Vitamin D deficiency 07/02/2015  . Insomnia 07/02/2015  . OA (osteoarthritis) of knee 07/02/2015  . GERD (gastroesophageal reflux disease) 07/02/2015    Past Surgical History:  Procedure Laterality Date  . DENTAL SURGERY      Family History  Problem Relation Age of Onset  . Heart disease Mother   . Breast cancer Neg Hx     Social History   Social History  . Marital status: Single    Spouse name: N/A  . Number  of children: N/A  . Years of education: N/A   Occupational History  . Not on file.   Social History Main Topics  . Smoking status: Never Smoker  . Smokeless tobacco: Never Used  . Alcohol use No  . Drug use: No  . Sexual activity: No   Other Topics Concern  . Not on file   Social History Narrative  . No narrative on file     Current Outpatient Prescriptions:  .  atorvastatin (LIPITOR) 20 MG tablet, Take 1 tablet (20 mg total) by mouth daily., Disp: 90 tablet, Rfl: 1 .  Calcium-Vitamin D-Vitamin K [id number] MG-UNT-MCG CHEW, Chew 1 tablet by mouth 2 (two) times daily., Disp: 60 tablet, Rfl: 12 .  gabapentin (NEURONTIN) 300 MG capsule, Take 1 capsule (300 mg total) by mouth at bedtime., Disp: 30 capsule, Rfl: 5 .  levothyroxine (SYNTHROID, LEVOTHROID) 75 MCG tablet, Take 1 tablet (75 mcg total) by mouth daily before breakfast., Disp: 30 tablet, Rfl: 5 .  LORazepam (ATIVAN) 0.5 MG tablet, Take 0.5 mg by mouth daily as needed for anxiety., Disp: , Rfl:  .  PARoxetine (PAXIL) 30 MG tablet, Take 1 tablet (30 mg total) by mouth daily., Disp: 30 tablet, Rfl: 5 .  Probiotic Product (PROBIOTIC-10 ULTIMATE) CAPS, Take 1 capsule by mouth daily., Disp: 30 capsule, Rfl: 5  No Known Allergies   ROS  Constitutional: Negative for fever or weight change.  Respiratory: Negative for cough and shortness of breath.   Cardiovascular: Negative for  chest pain or palpitations.  Gastrointestinal: Negative for abdominal pain, no bowel changes.  Musculoskeletal: Negative for gait problem or joint swelling.  Skin: Negative for rash.  Neurological: Negative for dizziness or headache.  No other specific complaints in a complete review of systems (except as listed in HPI above).  Objective  Vitals:   01/20/16 0952  BP: 124/68  Pulse: 86  Resp: 16  Temp: 98 F (36.7 C)  SpO2: 98%  Weight: 218 lb 7 oz (99.1 kg)  Height: 5\' 7"  (1.702 m)    Body mass index is 34.21 kg/m.  Physical  Exam  Constitutional: Patient appears well-developed and well-nourished. Obese No distress.  HEENT: head atraumatic, normocephalic, pupils equal and reactive to light,  neck supple, throat within normal limits Cardiovascular: Normal rate, regular rhythm and normal heart sounds.  No murmur heard. No BLE edema. Pulmonary/Chest: Effort normal and breath sounds normal. No respiratory distress. Abdominal: Soft.  There is no tenderness. Psychiatric: smiles, cooperative,  she has MR, stable   PHQ2/9: Depression screen [facility] 2/9 01/20/2016 10/20/2015 12/16/2014  Decreased Interest 0 0 0  Down, Depressed, Hopeless 0 0 0  PHQ - 2 Score 0 0 0    Fall Risk: Fall Risk  01/20/2016 10/20/2015 07/02/2015 12/16/2014  Falls in the past year? No No No No     Functional Status Survey: Is the patient deaf or have difficulty hearing?: No Does the patient have difficulty seeing, even when wearing glasses/contacts?: No Does the patient have difficulty concentrating, remembering, or making decisions?: Yes Does the patient have difficulty walking or climbing stairs?: No Does the patient have difficulty dressing or bathing?: No Does the patient have difficulty doing errands alone such as visiting a doctor's office or shopping?: Yes    Assessment & Plan  1. Hypothyroidism (acquired)  - levothyroxine (SYNTHROID, LEVOTHROID) 75 MCG tablet; Take 1 tablet (75 mcg total) by mouth daily before breakfast.  Dispense: 30 tablet; Refill: 5 - TSH  2. History of radioactive iodine thyroid ablation  - TSH  3. Dyslipidemia  - Lipid panel - atorvastatin (LIPITOR) 20 MG tablet; Take 1 tablet (20 mg total) by mouth daily.  Dispense: 30 tablet; Refill: 5  4. MR (mental retardation), severe  - PARoxetine (PAXIL) 30 MG tablet; Take 1 tablet (30 mg total) by mouth daily.  Dispense: 30 tablet; Refill: 5  5. Hypertension, benign  - triamterene-hydrochlorothiazide (MAXZIDE-25) 37.5-25 MG tablet; Take 1 tablet by mouth  daily.  Dispense: 30 tablet; Refill: 5 - COMPLETE METABOLIC PANEL WITH GFR  6. Class 2 obesity due to excess calories without serious comorbidity with body mass index (BMI) of 38.0 to 38.9 in adult  Discussed with the patient the risk posed by an increased BMI. Discussed importance of portion control, calorie counting and at least 150 minutes of physical activity weekly. Avoid sweet beverages and drink more water. Eat at least 6 servings of fruit and vegetables daily    7. Needs flu shot  - Flu Vaccine QUAD 36+ mos IM

## 2016-01-25 DIAGNOSIS — E039 Hypothyroidism, unspecified: Secondary | ICD-10-CM | POA: Diagnosis not present

## 2016-01-25 DIAGNOSIS — E785 Hyperlipidemia, unspecified: Secondary | ICD-10-CM | POA: Diagnosis not present

## 2016-01-25 DIAGNOSIS — Z923 Personal history of irradiation: Secondary | ICD-10-CM | POA: Diagnosis not present

## 2016-01-25 DIAGNOSIS — I10 Essential (primary) hypertension: Secondary | ICD-10-CM | POA: Diagnosis not present

## 2016-01-25 LAB — COMPLETE METABOLIC PANEL WITH GFR
ALT: 22 U/L (ref 6–29)
AST: 17 U/L (ref 10–35)
Albumin: 4.1 g/dL (ref 3.6–5.1)
Alkaline Phosphatase: 123 U/L (ref 33–130)
BILIRUBIN TOTAL: 0.6 mg/dL (ref 0.2–1.2)
BUN: 15 mg/dL (ref 7–25)
CO2: 29 mmol/L (ref 20–31)
Calcium: 9.8 mg/dL (ref 8.6–10.4)
Chloride: 104 mmol/L (ref 98–110)
Creat: 0.98 mg/dL (ref 0.50–1.05)
GFR, EST AFRICAN AMERICAN: 76 mL/min (ref >=60)
GFR, EST NON AFRICAN AMERICAN: 66 mL/min (ref >=60)
Glucose, Bld: 105 mg/dL — ABNORMAL HIGH (ref 65–99)
Potassium: 4 mmol/L (ref 3.5–5.3)
Sodium: 144 mmol/L (ref 135–146)
TOTAL PROTEIN: 7.7 g/dL (ref 6.1–8.1)

## 2016-01-25 LAB — LIPID PANEL
Cholesterol: 192 mg/dL (ref 125–200)
HDL: 74 mg/dL (ref >=46)
LDL CALC: 102 mg/dL (ref <130)
TRIGLYCERIDES: 78 mg/dL (ref <150)
Total CHOL/HDL Ratio: 2.6 Ratio (ref <=5.0)
VLDL: 16 mg/dL (ref <30)

## 2016-01-25 LAB — TSH: TSH: 6.17 mIU/L — ABNORMAL HIGH

## 2016-01-27 ENCOUNTER — Telehealth: Payer: Self-pay | Admitting: Family Medicine

## 2016-01-27 NOTE — Telephone Encounter (Signed)
Patient asking that you please send most recent thyroid result to Dr Rosario Jacks and [facility]. She has appointment with him on next week.

## 2016-01-28 NOTE — Telephone Encounter (Signed)
Send her labs to Dr. Rosario Jacks through our electronic system.

## 2016-01-29 ENCOUNTER — Telehealth: Payer: Self-pay | Admitting: Family Medicine

## 2016-01-29 ENCOUNTER — Telehealth: Payer: Self-pay

## 2016-01-29 NOTE — Telephone Encounter (Signed)
Yeah I already fax it. Thanks

## 2016-01-29 NOTE — Telephone Encounter (Signed)
Pts caregiver called asking that her Labs be faxed to Dr Matilde Bash office. Pt has an appt on 02/02/13.

## 2016-01-29 NOTE — Telephone Encounter (Signed)
Pt caregiver as for labs to be faxed over to dr. Rosario Jacks. Tiffany sent them electronic.

## 2016-01-29 NOTE — Telephone Encounter (Signed)
Thanks I will fax them

## 2016-01-29 NOTE — Telephone Encounter (Signed)
Caregiver may not be aware of that. I would fax them as well.

## 2016-02-03 DIAGNOSIS — R7889 Finding of other specified substances, not normally found in blood: Secondary | ICD-10-CM | POA: Diagnosis not present

## 2016-02-03 DIAGNOSIS — R739 Hyperglycemia, unspecified: Secondary | ICD-10-CM | POA: Diagnosis not present

## 2016-02-03 DIAGNOSIS — E039 Hypothyroidism, unspecified: Secondary | ICD-10-CM | POA: Diagnosis not present

## 2016-02-03 DIAGNOSIS — E032 Hypothyroidism due to medicaments and other exogenous substances: Secondary | ICD-10-CM | POA: Diagnosis not present

## 2016-02-03 LAB — BASIC METABOLIC PANEL
BUN: 11 mg/dL (ref 4–21)
CREATININE: 1 mg/dL (ref 0.5–1.1)
GLUCOSE: 85 mg/dL
Potassium: 3.7 mmol/L (ref 3.4–5.3)
SODIUM: 144 mmol/L (ref 137–147)

## 2016-02-03 LAB — HEMOGLOBIN A1C: Hemoglobin A1C: 6.3

## 2016-02-03 LAB — TSH: TSH: 6.1 u[IU]/mL — AB (ref 0.41–5.90)

## 2016-02-04 ENCOUNTER — Other Ambulatory Visit: Payer: Self-pay

## 2016-02-04 NOTE — Telephone Encounter (Signed)
Patient requesting refill of Gabapentin and Probiotics to [facility].

## 2016-02-06 MED ORDER — PROBIOTIC-10 ULTIMATE PO CAPS: 1.0000 | ORAL_CAPSULE | Freq: Every day | ORAL | 5 refills | Status: DC

## 2016-02-06 MED ORDER — GABAPENTIN 300 MG PO CAPS: 300.0000 mg | ORAL_CAPSULE | Freq: Every day | ORAL | 5 refills | Status: DC

## 2016-02-16 DIAGNOSIS — F339 Major depressive disorder, recurrent, unspecified: Secondary | ICD-10-CM | POA: Diagnosis not present

## 2016-02-24 ENCOUNTER — Ambulatory Visit: Payer: Self-pay | Admitting: Family Medicine

## 2016-03-10 ENCOUNTER — Encounter: Payer: Self-pay | Admitting: Family Medicine

## 2016-03-10 ENCOUNTER — Ambulatory Visit (INDEPENDENT_AMBULATORY_CARE_PROVIDER_SITE_OTHER): Payer: Medicare Other | Admitting: Family Medicine

## 2016-03-10 VITALS — BP 108/78 | HR 89 | Temp 97.7°F | Resp 18 | Ht 67.0 in | Wt 215.5 lb

## 2016-03-10 DIAGNOSIS — M170 Bilateral primary osteoarthritis of knee: Secondary | ICD-10-CM | POA: Diagnosis not present

## 2016-03-10 DIAGNOSIS — R7303 Prediabetes: Secondary | ICD-10-CM

## 2016-03-10 DIAGNOSIS — I10 Essential (primary) hypertension: Secondary | ICD-10-CM

## 2016-03-10 DIAGNOSIS — E039 Hypothyroidism, unspecified: Secondary | ICD-10-CM

## 2016-03-10 MED ORDER — TRIAMTERENE-HCTZ 37.5-25 MG PO TABS: 0.5000 | ORAL_TABLET | Freq: Every day | ORAL | 2 refills | Status: DC

## 2016-03-10 MED ORDER — ACETAMINOPHEN 500 MG PO TABS: 500.0000 mg | ORAL_TABLET | Freq: Three times a day (TID) | ORAL | 5 refills | Status: DC

## 2016-03-10 NOTE — Progress Notes (Signed)
Name: Rebecca Bruce   MRN: [id number]    DOB: [birth year]   Date:03/10/2016       Progress Note  Subjective  Chief Complaint  Chief Complaint  Patient presents with  . Follow-up    1 month F/U  . Hypertension    No complaints and states BP has been normal at home. Patient has lost 2 pounds since last visit.  . Joint Pain    Unchanged taking Tylenol as needed for the stiffness.     HPI  HTN: bp is lower, she is off Meloxicam, and it may be the reason it has improved. We will change Maxzide to half dose daily, advised caregiver to monitor bp at home and call back if it goes above 140/90. She has lost weight  OA: we stopped Meloxicam one month ago and caregiver Melina Schools ) has noticed that she seems more stiff, she is not complaining of pain, and is only Tylenol occasionally. Explained that long term NSAID's is not good for her and we will change to Tylenol three times daily instead of prn, advised to continue losing weight and move more  Hypothyroidism: seeing Dr. Rosario Jacks, last TSH was elevated and taking Synthroid 88 mcg daily now.   GERD: stopped Omeprazole and no symptoms of reflux, no abdominal pain.   Colon cancer screen: staff unable to collect specimen for cologuard, she went for colonoscopy but unable to be sedated and brother will not allow her to go back.   Pre-diabetes: hgbA1C 6.3% she is drinking water , still not active, we will change her diet at the group home, continue drinking more water.    Patient Active Problem List   Diagnosis Date Noted  . Other symptoms and signs involving cognitive functions and awareness 09/01/2015  . Dyslipidemia 07/02/2015  . MR (mental retardation), severe 07/02/2015  . History of hyperthyroidism 07/02/2015  . History of radioactive iodine thyroid ablation 07/02/2015  . Hypothyroidism (acquired) 07/02/2015  . Obesity 07/02/2015  . Hypertension, benign 07/02/2015  . Anxiety 07/02/2015  . Vitamin D deficiency 07/02/2015  .  Insomnia 07/02/2015  . OA (osteoarthritis) of knee 07/02/2015  . GERD (gastroesophageal reflux disease) 07/02/2015    Past Surgical History:  Procedure Laterality Date  . DENTAL SURGERY      Family History  Problem Relation Age of Onset  . Heart disease Mother   . Breast cancer Neg Hx     Social History   Social History  . Marital status: Single    Spouse name: N/A  . Number of children: N/A  . Years of education: N/A   Occupational History  . Not on file.   Social History Main Topics  . Smoking status: Never Smoker  . Smokeless tobacco: Never Used  . Alcohol use No  . Drug use: No  . Sexual activity: No   Other Topics Concern  . Not on file   Social History Narrative  . No narrative on file     Current Outpatient Prescriptions:  .  acetaminophen (TYLENOL) 500 MG tablet, Take 1 tablet (500 mg total) by mouth 3 (three) times daily., Disp: 90 tablet, Rfl: 5 .  atorvastatin (LIPITOR) 20 MG tablet, Take 1 tablet (20 mg total) by mouth daily., Disp: 30 tablet, Rfl: 5 .  Calcium-Vitamin D-Vitamin K [id number] MG-UNT-MCG CHEW, Chew 1 tablet by mouth 2 (two) times daily., Disp: 60 tablet, Rfl: 12 .  gabapentin (NEURONTIN) 300 MG capsule, Take 1 capsule (300 mg total) by mouth at  bedtime., Disp: 30 capsule, Rfl: 5 .  levothyroxine (SYNTHROID, LEVOTHROID) 88 MCG tablet, Take 88 mcg by mouth daily before breakfast., Disp: , Rfl:  .  LORazepam (ATIVAN) 0.5 MG tablet, Take 0.5 mg by mouth daily as needed for anxiety., Disp: , Rfl:  .  PARoxetine (PAXIL) 30 MG tablet, Take 1 tablet (30 mg total) by mouth daily., Disp: 30 tablet, Rfl: 5 .  Probiotic Product (PROBIOTIC-10 ULTIMATE) CAPS, Take 1 capsule by mouth daily., Disp: 30 capsule, Rfl: 5 .  triamterene-hydrochlorothiazide (MAXZIDE-25) 37.5-25 MG tablet, Take 0.5 tablets by mouth daily., Disp: 15 tablet, Rfl: 2  No Known Allergies   ROS  Ten systems reviewed and is negative except as mentioned in HPI    Objective  Vitals:   03/10/16 1057  BP: 108/78  Pulse: 89  Resp: 18  Temp: 97.7 F (36.5 C)  TempSrc: Oral  SpO2: 98%  Weight: 215 lb 8 oz (97.8 kg)  Height: 5\' 7"  (1.702 m)    Body mass index is 33.75 kg/m.  Physical Exam  Constitutional: Patient appears well-developed and well-nourished. Obese  No distress.  HEENT: head atraumatic, normocephalic, pupils equal and reactive to light,  neck supple, throat within normal limits Cardiovascular: Normal rate, regular rhythm and normal heart sounds.  No murmur heard. No BLE edema. Pulmonary/Chest: Effort normal and breath sounds normal. No respiratory distress. Abdominal: Soft.  There is no tenderness. Psychiatric: Patient has a normal mood and affect. Cooperative Muscular Skeletal: mild grinding with extension of right knee, no effusion    Recent Results (from the past 2160 hour(s))  Lipid panel     Status: None   Collection Time: 01/25/16  8:23 AM  Result Value Ref Range   Cholesterol 192 125 - 200 mg/dL   Triglycerides 78 <150 mg/dL   HDL 74 >=46 mg/dL   Total CHOL/HDL Ratio 2.6 <=5.0 Ratio   VLDL 16 <30 mg/dL   LDL Cholesterol 102 <130 mg/dL    Comment:   Total Cholesterol/HDL Ratio:CHD Risk                        Coronary Heart Disease Risk Table                                        Men       Women          1/2 Average Risk              3.4        3.3              Average Risk              5.0        4.4           2X Average Risk              9.6        7.1           3X Average Risk             23.4       11.0 Use the calculated Patient Ratio above and the CHD Risk table  to determine the patient's CHD Risk.   COMPLETE METABOLIC PANEL WITH GFR     Status: Abnormal   Collection Time: 01/25/16  8:23 AM  Result  Value Ref Range   Sodium 144 135 - 146 mmol/L   Potassium 4.0 3.5 - 5.3 mmol/L   Chloride 104 98 - 110 mmol/L   CO2 29 20 - 31 mmol/L   Glucose, Bld 105 (H) 65 - 99 mg/dL   BUN 15 7 - 25 mg/dL    Creat 0.98 0.50 - 1.05 mg/dL    Comment:   For patients > or = 54 years of age: The upper reference limit for Creatinine is approximately 13% higher for people identified as African-American.      Total Bilirubin 0.6 0.2 - 1.2 mg/dL   Alkaline Phosphatase 123 33 - 130 U/L   AST 17 10 - 35 U/L   ALT 22 6 - 29 U/L   Total Protein 7.7 6.1 - 8.1 g/dL   Albumin 4.1 3.6 - 5.1 g/dL   Calcium 9.8 8.6 - 10.4 mg/dL   GFR, Est African American 76 >=60 mL/min   GFR, Est Non African American 66 >=60 mL/min  TSH     Status: Abnormal   Collection Time: 01/25/16  8:23 AM  Result Value Ref Range   TSH 6.17 (H) mIU/L    Comment:   Reference Range   > or = 20 Years  0.40-4.50   Pregnancy Range First trimester  0.26-2.66 Second trimester 0.55-2.73 Third trimester  0.43-2.91     Basic metabolic panel     Status: None   Collection Time: 02/03/16 12:00 AM  Result Value Ref Range   Glucose 85 mg/dL   BUN 11 4 - 21 mg/dL   Creatinine 1.0 0.5 - 1.1 mg/dL   Potassium 3.7 3.4 - 5.3 mmol/L   Sodium 144 137 - 147 mmol/L  Hemoglobin A1c     Status: None   Collection Time: 02/03/16 12:00 AM  Result Value Ref Range   Hemoglobin A1C 6.3   TSH     Status: Abnormal   Collection Time: 02/03/16 12:00 AM  Result Value Ref Range   TSH 6.10 (A) 0.41 - 5.90 uIU/mL      PHQ2/9: Depression screen [facility] 2/9 01/20/2016 10/20/2015 12/16/2014  Decreased Interest 0 0 0  Down, Depressed, Hopeless 0 0 0  PHQ - 2 Score 0 0 0    Fall Risk: Fall Risk  01/20/2016 10/20/2015 07/02/2015 12/16/2014  Falls in the past year? No No No No     Assessment & Plan  1. Hypertension, benign  - triamterene-hydrochlorothiazide (MAXZIDE-25) 37.5-25 MG tablet; Take 0.5 tablets by mouth daily.  Dispense: 15 tablet; Refill: 2  2. Hypothyroidism (acquired)  Continue follow up with Dr. Rosario Jacks  3. Primary osteoarthritis of both knees  - acetaminophen (TYLENOL) 500 MG tablet; Take 1 tablet (500 mg total) by mouth 3 (three)  times daily.  Dispense: 90 tablet; Refill: 5  4. Pre-diabetes  Start ADA diet

## 2016-03-10 NOTE — Patient Instructions (Addendum)
DC cologuard order since unable to collect specimen Start Tylenol 500 mg in the morning, after day program and before bed time Decrease dose of Triamterene/hctz to half pill daily since bp is towards low end of normal.  Call back if bp at home is above 140/90 - please check bp weekly  Start a diabetic diet at the home - 2000 calories ADA diet

## 2016-03-30 DIAGNOSIS — E032 Hypothyroidism due to medicaments and other exogenous substances: Secondary | ICD-10-CM | POA: Diagnosis not present

## 2016-03-30 DIAGNOSIS — E039 Hypothyroidism, unspecified: Secondary | ICD-10-CM | POA: Diagnosis not present

## 2016-05-05 DIAGNOSIS — R5383 Other fatigue: Secondary | ICD-10-CM | POA: Diagnosis not present

## 2016-05-05 DIAGNOSIS — R789 Finding of unspecified substance, not normally found in blood: Secondary | ICD-10-CM | POA: Diagnosis not present

## 2016-05-05 DIAGNOSIS — R7889 Finding of other specified substances, not normally found in blood: Secondary | ICD-10-CM | POA: Diagnosis not present

## 2016-05-05 DIAGNOSIS — I158 Other secondary hypertension: Secondary | ICD-10-CM | POA: Diagnosis not present

## 2016-05-05 DIAGNOSIS — E039 Hypothyroidism, unspecified: Secondary | ICD-10-CM | POA: Diagnosis not present

## 2016-05-05 DIAGNOSIS — I10 Essential (primary) hypertension: Secondary | ICD-10-CM | POA: Diagnosis not present

## 2016-05-05 DIAGNOSIS — E032 Hypothyroidism due to medicaments and other exogenous substances: Secondary | ICD-10-CM | POA: Diagnosis not present

## 2016-06-09 ENCOUNTER — Ambulatory Visit: Payer: Self-pay | Admitting: Family Medicine

## 2016-06-30 DIAGNOSIS — E032 Hypothyroidism due to medicaments and other exogenous substances: Secondary | ICD-10-CM | POA: Diagnosis not present

## 2016-07-05 ENCOUNTER — Encounter: Payer: Self-pay | Admitting: Family Medicine

## 2016-07-05 ENCOUNTER — Ambulatory Visit (INDEPENDENT_AMBULATORY_CARE_PROVIDER_SITE_OTHER): Payer: Medicare Other | Admitting: Family Medicine

## 2016-07-05 VITALS — BP 118/78 | HR 94 | Temp 98.2°F | Resp 16 | Ht 67.0 in | Wt 212.4 lb

## 2016-07-05 DIAGNOSIS — Z0000 Encounter for general adult medical examination without abnormal findings: Secondary | ICD-10-CM

## 2016-07-05 DIAGNOSIS — I10 Essential (primary) hypertension: Secondary | ICD-10-CM

## 2016-07-05 DIAGNOSIS — R7303 Prediabetes: Secondary | ICD-10-CM | POA: Diagnosis not present

## 2016-07-05 DIAGNOSIS — Z79899 Other long term (current) drug therapy: Secondary | ICD-10-CM | POA: Diagnosis not present

## 2016-07-05 DIAGNOSIS — E785 Hyperlipidemia, unspecified: Secondary | ICD-10-CM

## 2016-07-05 DIAGNOSIS — Z124 Encounter for screening for malignant neoplasm of cervix: Secondary | ICD-10-CM

## 2016-07-05 DIAGNOSIS — Z923 Personal history of irradiation: Secondary | ICD-10-CM | POA: Diagnosis not present

## 2016-07-05 DIAGNOSIS — E039 Hypothyroidism, unspecified: Secondary | ICD-10-CM

## 2016-07-05 DIAGNOSIS — F72 Severe intellectual disabilities: Secondary | ICD-10-CM

## 2016-07-05 DIAGNOSIS — Z1231 Encounter for screening mammogram for malignant neoplasm of breast: Secondary | ICD-10-CM

## 2016-07-05 LAB — CBC WITH DIFFERENTIAL/PLATELET
BASOS ABS: 0 {cells}/uL (ref 0–200)
Basophils Relative: 0 %
EOS PCT: 2 %
Eosinophils Absolute: 138 cells/uL (ref 15–500)
HCT: 43.5 % (ref 35.0–45.0)
Hemoglobin: 14.1 g/dL (ref 11.7–15.5)
LYMPHS PCT: 42 %
Lymphs Abs: 2898 cells/uL (ref 850–3900)
MCH: 26.8 pg — AB (ref 27.0–33.0)
MCHC: 32.4 g/dL (ref 32.0–36.0)
MCV: 82.7 fL (ref 80.0–100.0)
MONOS PCT: 9 %
MPV: 9.3 fL (ref 7.5–12.5)
Monocytes Absolute: 621 cells/uL (ref 200–950)
NEUTROS ABS: 3243 {cells}/uL (ref 1500–7800)
Neutrophils Relative %: 47 %
Platelets: 367 10*3/uL (ref 140–400)
RBC: 5.26 MIL/uL — ABNORMAL HIGH (ref 3.80–5.10)
RDW: 15.4 % — ABNORMAL HIGH (ref 11.0–15.0)
WBC: 6.9 10*3/uL (ref 3.8–10.8)

## 2016-07-05 MED ORDER — ATORVASTATIN CALCIUM 20 MG PO TABS: 20.0000 mg | ORAL_TABLET | Freq: Every day | ORAL | 5 refills | Status: DC

## 2016-07-05 MED ORDER — TRIAMTERENE-HCTZ 37.5-25 MG PO TABS: 0.5000 | ORAL_TABLET | Freq: Every day | ORAL | 2 refills | Status: DC

## 2016-07-05 NOTE — Progress Notes (Signed)
Name: Rebecca Bruce   MRN: [id number]    DOB: [birth year]   Date:07/05/2016       Progress Note  Subjective  Chief Complaint  Chief Complaint  Patient presents with  . FL 2 forms    HPI   Functional ability/safety issues: She depends on caregiver for medication management, needs assistance bathing and dressing, transportation, lives in a group home, brother is the guardian Jammie Troup )  Hearing issues: Addressed  Activities of daily living: Discussed Home safety issues: No Issues  End Of Life Planning: Offered verbal information regarding advanced directives, healthcare power of attorney - discussed importance of having it done.   Preventative care, Health maintenance, Preventative health measures discussed.  Preventative screenings discussed today: lab work, colonoscopy - gets violent when having procedures done, we will try Cologuard., mammogram, DEXA.  Low Dose CT Chest recommended if Age 73-80 years, 30 pack-year currently smoking OR have quit w/in 15years.   Lifestyle risk factor issued reviewed: Diet, exercise, weight management, advised patient smoking is not healthy, nutrition/diet.  Preventative health measures discussed (5-10 year plan).  Reviewed and recommended vaccinations: - Pneumovax  - Prevnar  - Annual Influenza - Zostavax - Tdap   Depression screening: Done Fall risk screening: Done Discuss ADLs/IADLs: Done  Current medical providers: See HPI  Other health risk factors identified this visit: No other issues Cognitive impairment issues: None identified  All above discussed with patient. Appropriate education, counseling and referral will be made based upon the above.   HTN: taking medication as prescribed, no chest pain or palpitation  Hyperlipidemia: taking medication as prescribed and no problems  Hypothyroidism : seeing Dr. Rosario Jacks, s/p ablation for hyperthyroidism, we will recheck labs, caregiver states easier to be in  one office  Obesity: she has lost a few pounds since last visit, eating smaller portions and eating healthier. Doing well   MR: stable, sees psychiatrist for her behavior   Patient Active Problem List   Diagnosis Date Noted  . Other symptoms and signs involving cognitive functions and awareness 09/01/2015  . Dyslipidemia 07/02/2015  . MR (mental retardation), severe 07/02/2015  . History of hyperthyroidism 07/02/2015  . History of radioactive iodine thyroid ablation 07/02/2015  . Hypothyroidism (acquired) 07/02/2015  . Obesity 07/02/2015  . Hypertension, benign 07/02/2015  . Anxiety 07/02/2015  . Vitamin D deficiency 07/02/2015  . Insomnia 07/02/2015  . OA (osteoarthritis) of knee 07/02/2015  . GERD (gastroesophageal reflux disease) 07/02/2015    Past Surgical History:  Procedure Laterality Date  . DENTAL SURGERY      Family History  Problem Relation Age of Onset  . Heart disease Mother   . Breast cancer Neg Hx     Social History   Social History  . Marital status: Single    Spouse name: N/A  . Number of children: N/A  . Years of education: N/A   Occupational History  . Not on file.   Social History Main Topics  . Smoking status: Never Smoker  . Smokeless tobacco: Never Used  . Alcohol use No  . Drug use: No  . Sexual activity: No   Other Topics Concern  . Not on file   Social History Narrative  . No narrative on file     Current Outpatient Prescriptions:  .  acetaminophen (TYLENOL) 500 MG tablet, Take 1 tablet (500 mg total) by mouth 3 (three) times daily., Disp: 90 tablet, Rfl: 5 .  atorvastatin (LIPITOR) 20 MG tablet, Take 1 tablet (20  mg total) by mouth daily., Disp: 30 tablet, Rfl: 5 .  Calcium-Vitamin D-Vitamin K [id number] MG-UNT-MCG CHEW, Chew 1 tablet by mouth 2 (two) times daily., Disp: 60 tablet, Rfl: 12 .  gabapentin (NEURONTIN) 300 MG capsule, Take 1 capsule (300 mg total) by mouth at bedtime., Disp: 30 capsule, Rfl: 5 .  levothyroxine  (SYNTHROID, LEVOTHROID) 88 MCG tablet, Take 88 mcg by mouth daily before breakfast., Disp: , Rfl:  .  LORazepam (ATIVAN) 0.5 MG tablet, Take 0.5 mg by mouth daily as needed for anxiety., Disp: , Rfl:  .  PARoxetine (PAXIL) 30 MG tablet, Take 1 tablet (30 mg total) by mouth daily., Disp: 30 tablet, Rfl: 5 .  Probiotic Product (PROBIOTIC-10 ULTIMATE) CAPS, Take 1 capsule by mouth daily., Disp: 30 capsule, Rfl: 5 .  triamterene-hydrochlorothiazide (MAXZIDE-25) 37.5-25 MG tablet, Take 0.5 tablets by mouth daily., Disp: 15 tablet, Rfl: 2  No Known Allergies   ROS  Constitutional: Negative for fever or weight change.  Respiratory: Negative for cough and shortness of breath.   Cardiovascular: Negative for chest pain or palpitations.  Gastrointestinal: Negative for abdominal pain, no bowel changes.  Musculoskeletal: Negative for gait problem or joint swelling.  Skin: Negative for rash.  Neurological: Negative for dizziness or headache.  No other specific complaints in a complete review of systems (except as listed in HPI above).  Objective  Vitals:   07/05/16 0943  BP: 118/78  Pulse: 94  Resp: 16  Temp: 98.2 F (36.8 C)  SpO2: 98%  Weight: 212 lb 6 oz (96.3 kg)  Height: 5\' 7"  (1.702 m)    Body mass index is 33.26 kg/m.  Physical Exam  Constitutional: Patient appears well-developed and well-nourished. Obese  No distress.  HEENT: head atraumatic, normocephalic, pupils equal and reactive to light, ears normal TM, neck supple, throat within normal limits Cardiovascular: Normal rate, regular rhythm and normal heart sounds.  No murmur heard. No BLE edema. Pulmonary/Chest: Effort normal and breath sounds normal. No respiratory distress. Abdominal: Soft.  There is no tenderness. Psychiatric: Patient has a normal mood and affect.Cooperative  PHQ2/9: Depression screen [facility] 2/9 01/20/2016 10/20/2015 12/16/2014  Decreased Interest 0 0 0  Down, Depressed, Hopeless 0 0 0  PHQ - 2 Score 0 0 0     Fall Risk: Fall Risk  01/20/2016 10/20/2015 07/02/2015 12/16/2014  Falls in the past year? No No No No     Functional Status Survey: Is the patient deaf or have difficulty hearing?: No Does the patient have difficulty seeing, even when wearing glasses/contacts?: No Does the patient have difficulty concentrating, remembering, or making decisions?: Yes Does the patient have difficulty walking or climbing stairs?: No Does the patient have difficulty dressing or bathing?: No Does the patient have difficulty doing errands alone such as visiting a doctor's office or shopping?: Yes    Assessment & Plan  1. Medicare annual wellness visit, subsequent  Discussed importance of 150 minutes of physical activity weekly, eat two servings of fish weekly, eat one serving of tree nuts ( cashews, pistachios, pecans, almonds.Marland Kitchen) every other day, eat 6 servings of fruit/vegetables daily and drink plenty of water and avoid sweet beverages.   2. Hypothyroidism (acquired)  - TSH  3. Hypertension, benign  - triamterene-hydrochlorothiazide (MAXZIDE-25) 37.5-25 MG tablet; Take 0.5 tablets by mouth daily.  Dispense: 15 tablet; Refill: 2 - COMPLETE METABOLIC PANEL WITH GFR  4. Pre-diabetes  - Hemoglobin A1c - Insulin, fasting  5. History of radioactive iodine thyroid ablation  Sees Dr.  Jadali, but explained I can follow TSH for her  6. Dyslipidemia  - atorvastatin (LIPITOR) 20 MG tablet; Take 1 tablet (20 mg total) by mouth daily.  Dispense: 30 tablet; Refill: 5  7. MR (mental retardation), severe  Stable, sees Dr. Tamera Punt  8. Cervical cancer screening  refused  9. Encounter for screening mammogram for breast cancer  - MM Digital Screening; Future  10. Long-term use of high-risk medication  - CBC with Differential/Platelet

## 2016-07-06 LAB — HEMOGLOBIN A1C
Hgb A1c MFr Bld: 5.7 % — ABNORMAL HIGH (ref <5.7)
MEAN PLASMA GLUCOSE: 117 mg/dL

## 2016-07-06 LAB — COMPLETE METABOLIC PANEL WITH GFR
ALT: 23 U/L (ref 6–29)
AST: 17 U/L (ref 10–35)
Albumin: 4.2 g/dL (ref 3.6–5.1)
Alkaline Phosphatase: 90 U/L (ref 33–130)
BUN: 11 mg/dL (ref 7–25)
CO2: 30 mmol/L (ref 20–31)
Calcium: 9.6 mg/dL (ref 8.6–10.4)
Chloride: 103 mmol/L (ref 98–110)
Creat: 0.87 mg/dL (ref 0.50–1.05)
GFR, EST NON AFRICAN AMERICAN: 76 mL/min (ref >=60)
GFR, Est African American: 87 mL/min (ref >=60)
GLUCOSE: 95 mg/dL (ref 65–99)
POTASSIUM: 3.9 mmol/L (ref 3.5–5.3)
SODIUM: 141 mmol/L (ref 135–146)
Total Bilirubin: 0.5 mg/dL (ref 0.2–1.2)
Total Protein: 7.6 g/dL (ref 6.1–8.1)

## 2016-07-06 LAB — INSULIN, FASTING: INSULIN FASTING, SERUM: 10.7 u[IU]/mL (ref 2.0–19.6)

## 2016-07-06 LAB — TSH: TSH: 1.28 m[IU]/L

## 2016-07-07 ENCOUNTER — Telehealth: Payer: Self-pay | Admitting: Family Medicine

## 2016-07-07 NOTE — Telephone Encounter (Signed)
Erica from Dr Rosario Jacks office returned your call

## 2016-07-07 NOTE — Telephone Encounter (Signed)
Spoke with Hester Mates, appointment manager and received fax number to send labs to Supervisor Elouise Munroe to be reviewed. Also Mrs. Clegg states Alley will keep her appointment with Dr. Rosario Jacks, but does not want to get the message messed up. Wondered could Dr. Ancil Boozer called to state she will be taking over her care and get her notes.

## 2016-07-07 NOTE — Telephone Encounter (Signed)
I told the caregiver to keep current appointment and let Dr. Rosario Jacks that they prefer following up with me, he will need to send me the  Note.

## 2016-07-07 NOTE — Telephone Encounter (Signed)
Pt was referred to Dr Rosario Jacks due to thyroids being out of wack. Now that patient is doing better with her thyroids was told by Dr Ancil Boozer that she did not have to Dr Rosario Jacks any longer that Dr Ancil Boozer can manage it. [place] (Nutritional therapist for group home) is asking for a letter stating that it is okay for her to cancel all the appt with Rosario Jacks and just see Dr Ancil Boozer for this situation. [phone]

## 2016-07-07 NOTE — Telephone Encounter (Signed)
A message was left for Dr. Rosario Jacks regarding this patient and Dr. Steele Sizer taking over the care of her thyroid.  They were asked to give [place] a call back to confirm that they understood my message and agree to this patient's terms.

## 2016-07-08 NOTE — Telephone Encounter (Signed)
Returned [employer] call with Dr. Guerry Bruin office. She stated that she got my message and it was fine with them that Dr. Ancil Boozer assumes care of this patient's Thyroid.  [place] to inform her but there was no answer. A message was left for her with this information on her voicemail.

## 2016-07-12 ENCOUNTER — Other Ambulatory Visit: Payer: Self-pay | Admitting: Family Medicine

## 2016-07-12 DIAGNOSIS — F72 Severe intellectual disabilities: Secondary | ICD-10-CM

## 2016-07-12 DIAGNOSIS — M170 Bilateral primary osteoarthritis of knee: Secondary | ICD-10-CM

## 2016-07-14 NOTE — Telephone Encounter (Signed)
Patient requesting refill of Mapap and Paxil to [facility].

## 2016-08-01 ENCOUNTER — Other Ambulatory Visit: Payer: Self-pay

## 2016-08-01 MED ORDER — CALCIUM-VITAMIN D-VITAMIN K 500-500-40 MG-UNT-MCG PO CHEW: 1.0000 | CHEWABLE_TABLET | Freq: Two times a day (BID) | ORAL | 12 refills | Status: DC

## 2016-08-13 ENCOUNTER — Other Ambulatory Visit: Payer: Self-pay | Admitting: Family Medicine

## 2016-08-16 DIAGNOSIS — F339 Major depressive disorder, recurrent, unspecified: Secondary | ICD-10-CM | POA: Diagnosis not present

## 2016-08-23 ENCOUNTER — Encounter: Payer: Self-pay | Admitting: Family Medicine

## 2016-08-23 ENCOUNTER — Ambulatory Visit (INDEPENDENT_AMBULATORY_CARE_PROVIDER_SITE_OTHER): Payer: Medicare Other | Admitting: Family Medicine

## 2016-08-23 VITALS — BP 128/76 | HR 82 | Temp 97.7°F | Resp 16 | Ht 67.0 in | Wt 213.6 lb

## 2016-08-23 DIAGNOSIS — H578 Other specified disorders of eye and adnexa: Secondary | ICD-10-CM | POA: Diagnosis not present

## 2016-08-23 DIAGNOSIS — H5789 Other specified disorders of eye and adnexa: Secondary | ICD-10-CM

## 2016-08-23 NOTE — Progress Notes (Signed)
Name: Rebecca Bruce   MRN: [id number]    DOB: December 10, 1960   Date:08/23/2016       Progress Note  Subjective  Chief Complaint  Chief Complaint  Patient presents with  . Eye Problem    Another patient has pink eye in her group home and patient for the past 7 days has had red on the left eye.    HPI  Eye redness: caregiver noticed erythema on her left eye for the past week, patient denies increase in tear production, no pain or burning. She never had this symptoms before.    Patient Active Problem List   Diagnosis Date Noted  . Other symptoms and signs involving cognitive functions and awareness 09/01/2015  . Dyslipidemia 07/02/2015  . MR (mental retardation), severe 07/02/2015  . History of hyperthyroidism 07/02/2015  . History of radioactive iodine thyroid ablation 07/02/2015  . Hypothyroidism (acquired) 07/02/2015  . Obesity 07/02/2015  . Hypertension, benign 07/02/2015  . Anxiety 07/02/2015  . Vitamin D deficiency 07/02/2015  . Insomnia 07/02/2015  . OA (osteoarthritis) of knee 07/02/2015  . GERD (gastroesophageal reflux disease) 07/02/2015    Past Surgical History:  Procedure Laterality Date  . DENTAL SURGERY      Family History  Problem Relation Age of Onset  . Heart disease Mother   . Breast cancer Neg Hx     Social History   Social History  . Marital status: Single    Spouse name: N/A  . Number of children: N/A  . Years of education: N/A   Occupational History  . Not on file.   Social History Main Topics  . Smoking status: Never Smoker  . Smokeless tobacco: Never Used  . Alcohol use No  . Drug use: No  . Sexual activity: No   Other Topics Concern  . Not on file   Social History Narrative  . No narrative on file     Current Outpatient Prescriptions:  .  atorvastatin (LIPITOR) 20 MG tablet, Take 1 tablet (20 mg total) by mouth daily., Disp: 30 tablet, Rfl: 5 .  Calcium-Vitamin D-Vitamin K [id number] MG-UNT-MCG CHEW, Chew 1 tablet by  mouth 2 (two) times daily., Disp: 60 tablet, Rfl: 12 .  gabapentin (NEURONTIN) 300 MG capsule, TAKE ONE CAPSULE BY MOUTH AT BEDTIME., Disp: 30 capsule, Rfl: 5 .  levothyroxine (SYNTHROID, LEVOTHROID) 88 MCG tablet, Take 88 mcg by mouth daily before breakfast., Disp: , Rfl:  .  LORazepam (ATIVAN) 0.5 MG tablet, Take 0.5 mg by mouth daily as needed for anxiety., Disp: , Rfl:  .  MAPAP 500 MG tablet, TAKE ONE TABLET BY MOUTH 3 TIMES DAILY**STAR POINT**, Disp: 90 tablet, Rfl: 0 .  PARoxetine (PAXIL) 30 MG tablet, TAKE ONE TABLET BY MOUTH AT BEDTIME. (DEPRESSION), Disp: 30 tablet, Rfl: 0 .  Probiotic Product (PROBIOTIC-10 ULTIMATE) CAPS, Take 1 capsule by mouth daily., Disp: 30 capsule, Rfl: 5 .  triamterene-hydrochlorothiazide (MAXZIDE-25) 37.5-25 MG tablet, Take 0.5 tablets by mouth daily., Disp: 15 tablet, Rfl: 2  No Known Allergies   ROS  Ten systems reviewed and is negative except as mentioned in HPI   Objective  Vitals:   08/23/16 1554  BP: 128/76  Pulse: 82  Resp: 16  Temp: 97.7 F (36.5 C)  TempSrc: Oral  SpO2: 98%  Weight: 213 lb 9.6 oz (96.9 kg)  Height: _0  (1.702 m)    Body mass index is 33.45 kg/m.  Physical Exam  Constitutional: Patient appears well-developed and well-nourished. Obese  No  distress.  HEENT: head atraumatic, normocephalic, pupils equal and reactive to light, right eye has erythema on the left outer eye, seems to have pterygium and a dark spot/looks like foreign body ,neck supple, throat within normal limits Cardiovascular: Normal rate, regular rhythm and normal heart sounds.  No murmur heard. No BLE edema. Pulmonary/Chest: Effort normal and breath sounds normal. No respiratory distress. Abdominal: Soft.  There is no tenderness. Psychiatric: Patient has a normal mood and affect.cooperative, seems happy   Recent Results (from the past 2160 hour(s))  COMPLETE METABOLIC PANEL WITH GFR     Status: None   Collection Time: 07/05/16 10:48 AM  Result  Value Ref Range   Sodium 141 135 - 146 mmol/L   Potassium 3.9 3.5 - 5.3 mmol/L   Chloride 103 98 - 110 mmol/L   CO2 30 20 - 31 mmol/L   Glucose, Bld 95 65 - 99 mg/dL   BUN 11 7 - 25 mg/dL   Creat 0.87 0.50 - 1.05 mg/dL    Comment:   For patients > or = 55 years of age: The upper reference limit for Creatinine is approximately 13% higher for people identified as African-American.      Total Bilirubin 0.5 0.2 - 1.2 mg/dL   Alkaline Phosphatase 90 33 - 130 U/L   AST 17 10 - 35 U/L   ALT 23 6 - 29 U/L   Total Protein 7.6 6.1 - 8.1 g/dL   Albumin 4.2 3.6 - 5.1 g/dL   Calcium 9.6 8.6 - 10.4 mg/dL   GFR, Est African American 87 >=60 mL/min   GFR, Est Non African American 76 >=60 mL/min  Hemoglobin A1c     Status: Abnormal   Collection Time: 07/05/16 10:48 AM  Result Value Ref Range   Hgb A1c MFr Bld 5.7 (H) <5.7 %    Comment:   For someone without known diabetes, a hemoglobin A1c value between 5.7% and 6.4% is consistent with prediabetes and should be confirmed with a follow-up test.   For someone with known diabetes, a value <7% indicates that their diabetes is well controlled. A1c targets should be individualized based on duration of diabetes, age, co-morbid conditions and other considerations.   This assay result is consistent with an increased risk of diabetes.   Currently, no consensus exists regarding use of hemoglobin A1c for diagnosis of diabetes in children.      Mean Plasma Glucose 117 mg/dL  Insulin, fasting     Status: None   Collection Time: 07/05/16 10:48 AM  Result Value Ref Range   Insulin fasting, serum 10.7 2.0 - 19.6 uIU/mL    Comment:   This insulin assay shows strong cross-reactivity for some insulin analogs (lispro, aspart, and glargine) and much lower cross-reactivity with others (detemir, glulisine).   Stimulated Insulin reference intervals were established using the Siemens Immulite assay. These values are provided for general guidance only.    TSH     Status: None   Collection Time: 07/05/16 10:48 AM  Result Value Ref Range   TSH 1.28 mIU/L    Comment:   Reference Range   > or = 20 Years  0.40-4.50   Pregnancy Range First trimester  0.26-2.66 Second trimester 0.55-2.73 Third trimester  0.43-2.91     CBC with Differential/Platelet     Status: Abnormal   Collection Time: 07/05/16 10:48 AM  Result Value Ref Range   WBC 6.9 3.8 - 10.8 K/uL   RBC 5.26 (H) 3.80 - 5.10 MIL/uL  Hemoglobin 14.1 11.7 - 15.5 g/dL   HCT 43.5 35.0 - 45.0 %   MCV 82.7 80.0 - 100.0 fL   MCH 26.8 (L) 27.0 - 33.0 pg   MCHC 32.4 32.0 - 36.0 g/dL   RDW 15.4 (H) 11.0 - 15.0 %   Platelets 367 140 - 400 K/uL   MPV 9.3 7.5 - 12.5 fL   Neutro Abs 3,243 1,500 - 7,800 cells/uL   Lymphs Abs 2,898 850 - 3,900 cells/uL   Monocytes Absolute 621 200 - 950 cells/uL   Eosinophils Absolute 138 15 - 500 cells/uL   Basophils Absolute 0 0 - 200 cells/uL   Neutrophils Relative % 47 %   Lymphocytes Relative 42 %   Monocytes Relative 9 %   Eosinophils Relative 2 %   Basophils Relative 0 %   Smear Review Criteria for review not met      PHQ2/9: Depression screen [facility] 2/9 01/20/2016 10/20/2015 12/16/2014  Decreased Interest 0 0 0  Down, Depressed, Hopeless 0 0 0  PHQ - 2 Score 0 0 0     Fall Risk: Fall Risk  08/23/2016 01/20/2016 10/20/2015 07/02/2015 12/16/2014  Falls in the past year? _0      Assessment & Plan  1. Irritation of right eye  - Ambulatory referral to Ophthalmology

## 2016-08-25 DIAGNOSIS — T1502XA Foreign body in cornea, left eye, initial encounter: Secondary | ICD-10-CM | POA: Diagnosis not present

## 2016-09-14 ENCOUNTER — Other Ambulatory Visit: Payer: Self-pay | Admitting: Family Medicine

## 2016-09-14 DIAGNOSIS — F72 Severe intellectual disabilities: Secondary | ICD-10-CM

## 2016-09-14 NOTE — Telephone Encounter (Signed)
Patient requesting refill of Paxil to [facility].

## 2016-09-16 ENCOUNTER — Encounter: Payer: Self-pay | Admitting: Family Medicine

## 2016-09-16 ENCOUNTER — Other Ambulatory Visit: Payer: Self-pay | Admitting: Family Medicine

## 2016-09-16 NOTE — Telephone Encounter (Signed)
Patient requesting refill of Calcium and Vit D chewable to [facility].

## 2016-09-16 NOTE — Telephone Encounter (Signed)
Please let pt know she had 12 refills provided in April 2018. Thank you!

## 2016-09-27 ENCOUNTER — Other Ambulatory Visit: Payer: Self-pay | Admitting: Family Medicine

## 2016-09-27 DIAGNOSIS — F72 Severe intellectual disabilities: Secondary | ICD-10-CM

## 2016-09-28 NOTE — Telephone Encounter (Signed)
Patient requesting refill of Paxil to [facility].

## 2016-10-03 ENCOUNTER — Other Ambulatory Visit: Payer: Self-pay | Admitting: Family Medicine

## 2016-10-03 NOTE — Telephone Encounter (Signed)
Patient requesting refill of Probiotics to [facility].

## 2016-10-04 ENCOUNTER — Other Ambulatory Visit: Payer: Self-pay | Admitting: Family Medicine

## 2016-10-04 DIAGNOSIS — I10 Essential (primary) hypertension: Secondary | ICD-10-CM

## 2016-10-04 NOTE — Telephone Encounter (Signed)
Patient requesting refill of Maxzide to [facility].

## 2016-11-11 ENCOUNTER — Ambulatory Visit
Admission: RE | Admit: 2016-11-11 | Discharge: 2016-11-11 | Disposition: A | Discharge status: Home / Self Care | Payer: Medicare Other | Source: Ambulatory Visit | Attending: Family Medicine | Admitting: Family Medicine

## 2016-11-11 DIAGNOSIS — Z1231 Encounter for screening mammogram for malignant neoplasm of breast: Secondary | ICD-10-CM | POA: Diagnosis not present

## 2016-11-11 IMAGING — MG MM DIGITAL SCREENING BILAT W/ CAD
5 series · 5 of 5 positions shown · non-contrast
Comparison: Previous exam(s).

CLINICAL DATA: Screening.

EXAM:
DIGITAL SCREENING BILATERAL MAMMOGRAM WITH CAD

[R MLO (1 of 2)]
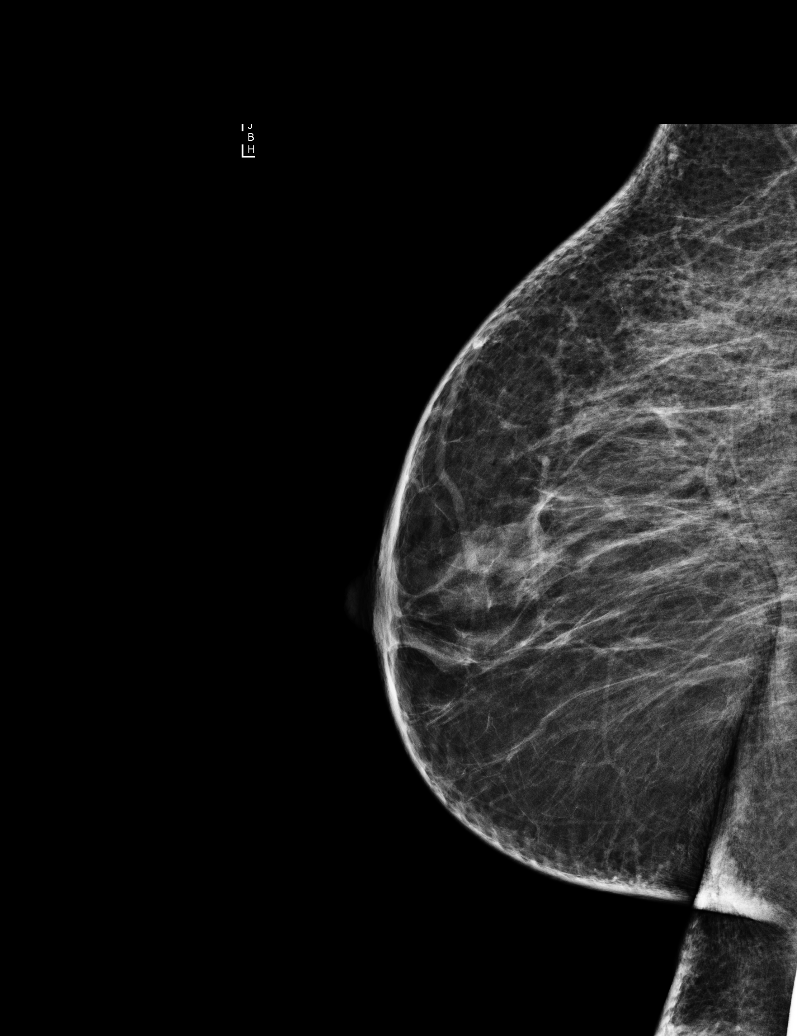

[L CC]
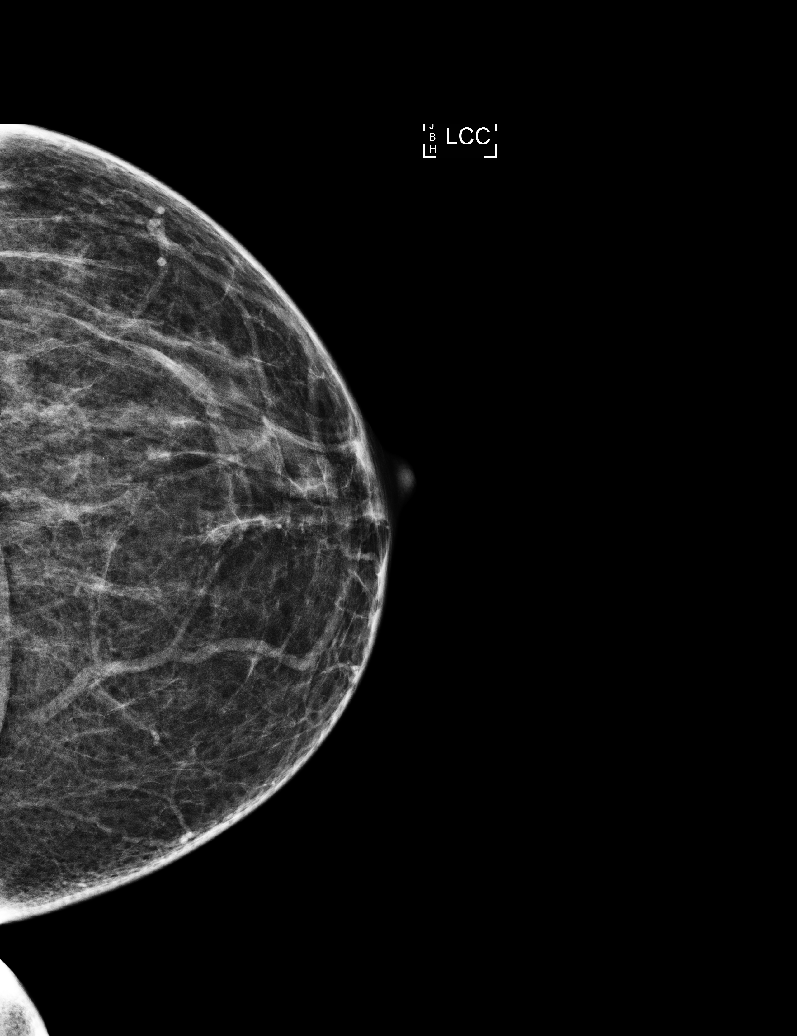

[R MLO (2 of 2)]
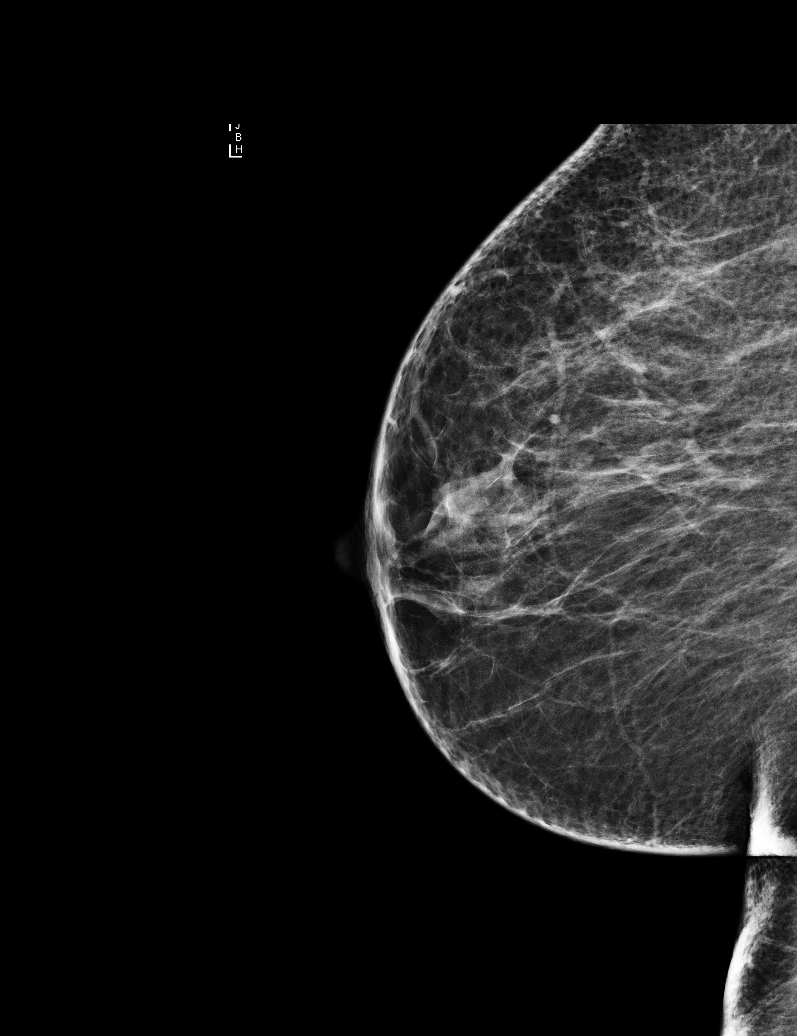

[R CC]
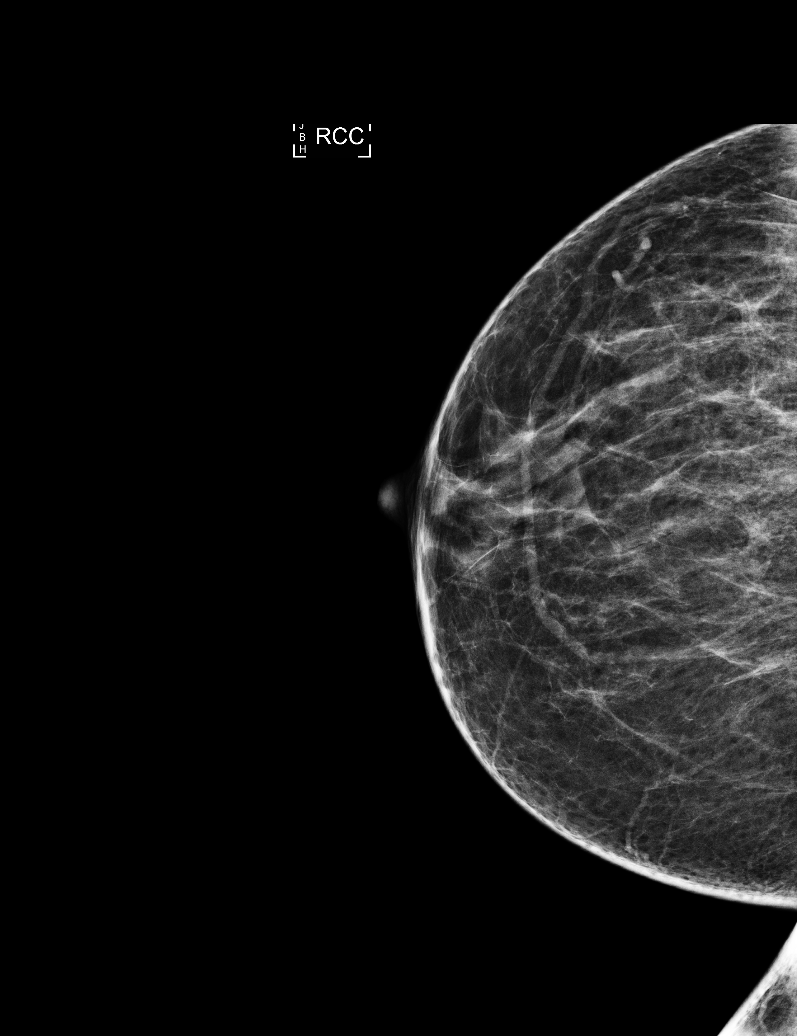

[L MLO]
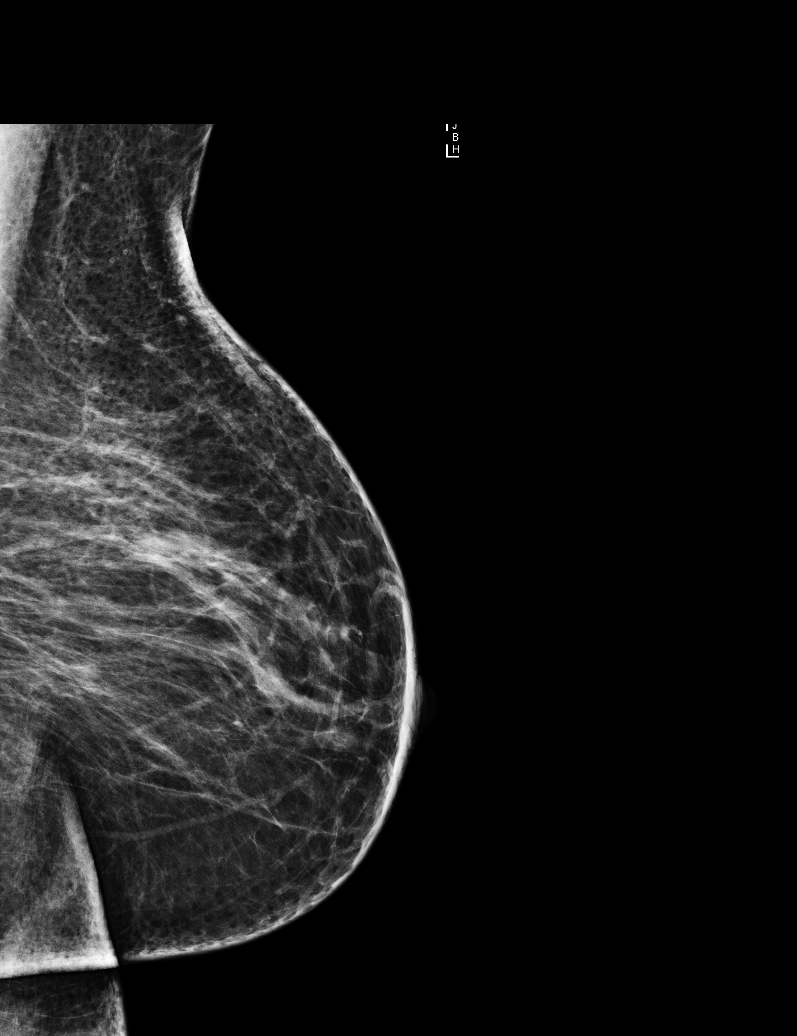

[5 of 5 positions shown; findings below may reference images not displayed]

ACR Breast Density Category b: There are scattered areas of
fibroglandular density.
FINDINGS: There are no findings suspicious for malignancy. Images were
processed with CAD.
IMPRESSION: No mammographic evidence of malignancy. A result letter of this
screening mammogram will be mailed directly to the patient.

RECOMMENDATION:
Screening mammogram in one year. (Code:[US])

BI-RADS CATEGORY  1: Negative.

## 2016-12-20 DIAGNOSIS — H6011 Cellulitis of right external ear: Secondary | ICD-10-CM | POA: Diagnosis not present

## 2016-12-28 ENCOUNTER — Encounter: Payer: Self-pay | Admitting: Family Medicine

## 2016-12-28 ENCOUNTER — Ambulatory Visit (INDEPENDENT_AMBULATORY_CARE_PROVIDER_SITE_OTHER): Payer: Medicare Other | Admitting: Family Medicine

## 2016-12-28 VITALS — BP 124/82 | HR 77 | Temp 97.5°F | Resp 16 | Ht 67.0 in | Wt 214.6 lb

## 2016-12-28 DIAGNOSIS — E6609 Other obesity due to excess calories: Secondary | ICD-10-CM | POA: Diagnosis not present

## 2016-12-28 DIAGNOSIS — R7303 Prediabetes: Secondary | ICD-10-CM | POA: Diagnosis not present

## 2016-12-28 DIAGNOSIS — Z23 Encounter for immunization: Secondary | ICD-10-CM | POA: Diagnosis not present

## 2016-12-28 DIAGNOSIS — Z6838 Body mass index (BMI) 38.0-38.9, adult: Secondary | ICD-10-CM | POA: Diagnosis not present

## 2016-12-28 DIAGNOSIS — I10 Essential (primary) hypertension: Secondary | ICD-10-CM

## 2016-12-28 DIAGNOSIS — E785 Hyperlipidemia, unspecified: Secondary | ICD-10-CM | POA: Diagnosis not present

## 2016-12-28 DIAGNOSIS — F72 Severe intellectual disabilities: Secondary | ICD-10-CM

## 2016-12-28 DIAGNOSIS — E039 Hypothyroidism, unspecified: Secondary | ICD-10-CM | POA: Diagnosis not present

## 2016-12-28 DIAGNOSIS — E559 Vitamin D deficiency, unspecified: Secondary | ICD-10-CM

## 2016-12-28 DIAGNOSIS — Z923 Personal history of irradiation: Secondary | ICD-10-CM | POA: Diagnosis not present

## 2016-12-28 MED ORDER — GABAPENTIN 300 MG PO CAPS
300.0000 mg | ORAL_CAPSULE | Freq: Every day | ORAL | 5 refills | Status: DC
Start: 2016-12-28 — End: 2017-08-09

## 2016-12-28 MED ORDER — LEVOTHYROXINE SODIUM 100 MCG PO TABS: 100.0000 ug | ORAL_TABLET | Freq: Every day | ORAL | 5 refills | Status: DC

## 2016-12-28 MED ORDER — ATORVASTATIN CALCIUM 20 MG PO TABS: 20.0000 mg | ORAL_TABLET | Freq: Every day | ORAL | 5 refills | Status: DC

## 2016-12-28 NOTE — Progress Notes (Signed)
Name: Rebecca Bruce   MRN: [id number]    DOB: Jul 15, 1960   Date:12/28/2016       Progress Note  Subjective  Chief Complaint  Chief Complaint  Patient presents with  . Hypertension    6 month follow up  . Hypothyroidism  . Anxiety  . Insomnia  . Gastroesophageal Reflux  . Flu Vaccine    HPI   She came in with caregiver: [place].  HTN: taking medication as prescribed, no chest pain or palpitation, bp is at goal.   Hyperlipidemia: taking medication as prescribed and no problems, denies myalgias. She lives in a group home and follows a low fat diet  Hypothyroidism : used to see  Dr. Rosario Jacks, s/p ablation for hyperthyroidism, doing well on Synthroid 100 mcg daily, we will repeat levels today. No constipation, dry skin.  Obesity: weight has been stable, discussed importance of exercising more.  MR: stable, sees psychiatrist for her behavior - she has been repeating conversations and talking to herself more than usual lately, caregiver states that she has episodes that she can be aggressive at times.  Pre-diabetes: hgbA1C was 6.3% last year, she has been eating less sweets, denies polyphagia, polydipsia or polyuria.   Patient Active Problem List   Diagnosis Date Noted  . Other symptoms and signs involving cognitive functions and awareness 09/01/2015  . Dyslipidemia 07/02/2015  . MR (mental retardation), severe 07/02/2015  . History of hyperthyroidism 07/02/2015  . History of radioactive iodine thyroid ablation 07/02/2015  . Hypothyroidism (acquired) 07/02/2015  . Obesity 07/02/2015  . Hypertension, benign 07/02/2015  . Anxiety 07/02/2015  . Vitamin D deficiency 07/02/2015  . Insomnia 07/02/2015  . OA (osteoarthritis) of knee 07/02/2015  . GERD (gastroesophageal reflux disease) 07/02/2015    Past Surgical History:  Procedure Laterality Date  . DENTAL SURGERY      Family History  Problem Relation Age of Onset  . Heart disease Mother   . Breast cancer  Neg Hx     Social History   Social History  . Marital status: Single    Spouse name: N/A  . Number of children: N/A  . Years of education: N/A   Occupational History  . Not on file.   Social History Main Topics  . Smoking status: Never Smoker  . Smokeless tobacco: Never Used  . Alcohol use No  . Drug use: No  . Sexual activity: No   Other Topics Concern  . Not on file   Social History Narrative  . No narrative on file     Current Outpatient Prescriptions:  .  atorvastatin (LIPITOR) 20 MG tablet, Take 1 tablet (20 mg total) by mouth daily., Disp: 30 tablet, Rfl: 5 .  Calcium-Vitamin D-Vitamin K [id number] MG-UNT-MCG CHEW, Chew 1 tablet by mouth 2 (two) times daily., Disp: 60 tablet, Rfl: 12 .  gabapentin (NEURONTIN) 300 MG capsule, TAKE ONE CAPSULE BY MOUTH AT BEDTIME., Disp: 30 capsule, Rfl: 5 .  levothyroxine (SYNTHROID, LEVOTHROID) 88 MCG tablet, Take 100 mcg by mouth daily before breakfast. , Disp: , Rfl:  .  LORazepam (ATIVAN) 0.5 MG tablet, Take 0.5 mg by mouth daily as needed for anxiety., Disp: , Rfl:  .  MAPAP 500 MG tablet, TAKE ONE TABLET BY MOUTH 3 TIMES DAILY**STAR POINT**, Disp: 90 tablet, Rfl: 0 .  mupirocin ointment (BACTROBAN) 2 %, Apply topically., Disp: , Rfl:  .  PARoxetine (PAXIL) 30 MG tablet, TAKE ONE TABLET BY MOUTH AT BEDTIME. (DEPRESSION), Disp: 30 tablet, Rfl: 10 .  Probiotic Product (PROBIOTIC-10 ULTIMATE) CAPS, TAKE ONE CAPSULE BY MOUTH EVERY DAY, Disp: 30 capsule, Rfl: 11 .  triamterene-hydrochlorothiazide (MAXZIDE-25) 37.5-25 MG tablet, TAKE 1/2 TABLET BY MOUTH EVERY DAY, Disp: 15 tablet, Rfl: 11  No Known Allergies   ROS  Constitutional: Negative for fever or weight change.  Respiratory: Negative for cough and shortness of breath.   Cardiovascular: Negative for chest pain or palpitations.  Gastrointestinal: Negative for abdominal pain, no bowel changes.  Musculoskeletal: Positive for gait problem but no joint swelling.  Skin: Negative  for rash.  Neurological: Negative for dizziness or headache.  No other specific complaints in a complete review of systems (except as listed in HPI above).  Objective  Vitals:   12/28/16 1004  BP: 124/82  Pulse: 77  Resp: 16  Temp: (!) 97.5 F (36.4 C)  SpO2: 94%  Weight: 214 lb 9 oz (97.3 kg)  Height: 5\' 7"  (1.702 m)    Body mass index is 33.61 kg/m.  Physical Exam  Constitutional: Patient appears well-developed and well-nourished. Obese  No distress.  HEENT: head atraumatic, normocephalic, pupils equal and reactive to light,  neck supple, throat within normal limits Cardiovascular: Normal rate, regular rhythm and normal heart sounds.  No murmur heard. No BLE edema. Pulmonary/Chest: Effort normal and breath sounds normal. No respiratory distress. Abdominal: Soft.  There is no tenderness. Skin: some skin ulceration on right arm, from self picking Psychiatric: Patient has a normal mood and affect. behavior is normal. Judgment and thought content normal.  PHQ2/9: Depression screen [facility] 2/9 01/20/2016 10/20/2015 12/16/2014  Decreased Interest 0 0 0  Down, Depressed, Hopeless 0 0 0  PHQ - 2 Score 0 0 0    Fall Risk: Fall Risk  08/23/2016 01/20/2016 10/20/2015 07/02/2015 12/16/2014  Falls in the past year? No No No No No     Assessment & Plan  1. Hypothyroidism (acquired)  - TSH  2. Need for influenza vaccination  - Flu Vaccine QUAD 6+ mos PF IM (Fluarix Quad PF)  3. Hypertension, benign  - COMPLETE METABOLIC PANEL WITH GFR  4. Pre-diabetes  - Hemoglobin A1c  5. History of radioactive iodine thyroid ablation  - TSH  6. Dyslipidemia  - Lipid panel  7. MR (mental retardation), severe  Stable, continue follow up with Dr. Tamera Punt for her behavior management  8. Class 2 obesity due to excess calories without serious comorbidity with body mass index (BMI) of 38.0 to 38.9 in adult  Discussed with the patient the risk posed by an increased BMI. Discussed  importance of portion control, calorie counting and at least 150 minutes of physical activity weekly. Avoid sweet beverages and drink more water. Eat at least 6 servings of fruit and vegetables daily   9. Vitamin D deficiency  - VITAMIN D 25 Hydroxy (Vit-D Deficiency, Fractures)

## 2017-01-04 DIAGNOSIS — R7303 Prediabetes: Secondary | ICD-10-CM | POA: Diagnosis not present

## 2017-01-04 DIAGNOSIS — I10 Essential (primary) hypertension: Secondary | ICD-10-CM | POA: Diagnosis not present

## 2017-01-04 DIAGNOSIS — E785 Hyperlipidemia, unspecified: Secondary | ICD-10-CM | POA: Diagnosis not present

## 2017-01-04 DIAGNOSIS — Z923 Personal history of irradiation: Secondary | ICD-10-CM | POA: Diagnosis not present

## 2017-01-04 DIAGNOSIS — E559 Vitamin D deficiency, unspecified: Secondary | ICD-10-CM | POA: Diagnosis not present

## 2017-01-04 DIAGNOSIS — E039 Hypothyroidism, unspecified: Secondary | ICD-10-CM | POA: Diagnosis not present

## 2017-01-05 ENCOUNTER — Encounter: Payer: Self-pay | Admitting: Family Medicine

## 2017-01-05 DIAGNOSIS — R7303 Prediabetes: Secondary | ICD-10-CM | POA: Insufficient documentation

## 2017-01-05 LAB — HEMOGLOBIN A1C
EAG (MMOL/L): 7 (calc)
Hgb A1c MFr Bld: 6 % of total Hgb — ABNORMAL HIGH (ref <5.7)
Mean Plasma Glucose: 126 (calc)

## 2017-01-05 LAB — COMPLETE METABOLIC PANEL WITH GFR
AG Ratio: 1.4 (calc) (ref 1.0–2.5)
ALBUMIN MSPROF: 4.1 g/dL (ref 3.6–5.1)
ALT: 25 U/L (ref 6–29)
AST: 19 U/L (ref 10–35)
Alkaline phosphatase (APISO): 93 U/L (ref 33–130)
BUN: 12 mg/dL (ref 7–25)
CALCIUM: 9.5 mg/dL (ref 8.6–10.4)
CO2: 29 mmol/L (ref 20–32)
CREATININE: 0.92 mg/dL (ref 0.50–1.05)
Chloride: 103 mmol/L (ref 98–110)
GFR, Est African American: 81 mL/min/{1.73_m2} (ref >=60)
GFR, Est Non African American: 70 mL/min/{1.73_m2} (ref >=60)
GLOBULIN: 2.9 g/dL (ref 1.9–3.7)
Glucose, Bld: 123 mg/dL — ABNORMAL HIGH (ref 65–99)
Potassium: 3.9 mmol/L (ref 3.5–5.3)
SODIUM: 142 mmol/L (ref 135–146)
Total Bilirubin: 0.5 mg/dL (ref 0.2–1.2)
Total Protein: 7 g/dL (ref 6.1–8.1)

## 2017-01-05 LAB — LIPID PANEL
CHOL/HDL RATIO: 2.5 (calc) (ref <5.0)
Cholesterol: 167 mg/dL (ref <200)
HDL: 67 mg/dL (ref >50)
LDL Cholesterol (Calc): 82 mg/dL (calc)
Non-HDL Cholesterol (Calc): 100 mg/dL (calc) (ref <130)
Triglycerides: 85 mg/dL (ref <150)

## 2017-01-05 LAB — TSH: TSH: 2.05 mIU/L

## 2017-01-05 LAB — VITAMIN D 25 HYDROXY (VIT D DEFICIENCY, FRACTURES): Vit D, 25-Hydroxy: 47 ng/mL (ref 30–100)

## 2017-02-14 DIAGNOSIS — F339 Major depressive disorder, recurrent, unspecified: Secondary | ICD-10-CM | POA: Diagnosis not present

## 2017-02-24 ENCOUNTER — Other Ambulatory Visit: Payer: Self-pay | Admitting: Family Medicine

## 2017-02-24 DIAGNOSIS — M170 Bilateral primary osteoarthritis of knee: Secondary | ICD-10-CM

## 2017-02-24 NOTE — Telephone Encounter (Signed)
Refill request for general medication: MAPAP 500 mg  Last office visit: 12/28/2016  Last physical exam: 07/05/2016  Follow up visit:  07/06/2017

## 2017-02-27 ENCOUNTER — Other Ambulatory Visit: Payer: Self-pay | Admitting: Family Medicine

## 2017-02-27 DIAGNOSIS — M170 Bilateral primary osteoarthritis of knee: Secondary | ICD-10-CM

## 2017-02-27 MED ORDER — ACETAMINOPHEN 500 MG PO TABS: 500.0000 mg | ORAL_TABLET | Freq: Three times a day (TID) | ORAL | 1 refills | Status: AC | PRN

## 2017-02-27 NOTE — Telephone Encounter (Signed)
Refill request for general medication:  Last office visit:  Last physical exam:  @AFUTAPPT @

## 2017-03-07 ENCOUNTER — Ambulatory Visit: Payer: Self-pay | Admitting: Family Medicine

## 2017-03-13 ENCOUNTER — Ambulatory Visit (INDEPENDENT_AMBULATORY_CARE_PROVIDER_SITE_OTHER): Payer: Medicare Other | Admitting: Family Medicine

## 2017-03-13 ENCOUNTER — Encounter: Payer: Self-pay | Admitting: Family Medicine

## 2017-03-13 VITALS — BP 136/88 | HR 98 | Temp 98.4°F | Resp 18 | Ht 67.0 in | Wt 212.8 lb

## 2017-03-13 DIAGNOSIS — R21 Rash and other nonspecific skin eruption: Secondary | ICD-10-CM

## 2017-03-13 MED ORDER — TRIAMCINOLONE ACETONIDE 0.1 % EX CREA: 1.0000 "application " | TOPICAL_CREAM | Freq: Two times a day (BID) | CUTANEOUS | 0 refills | Status: DC

## 2017-03-13 NOTE — Progress Notes (Signed)
Name: Rebecca Bruce   MRN: [id number]    DOB: Apr 21, 1960   Date:03/13/2017       Progress Note  Subjective  Chief Complaint  Chief Complaint  Patient presents with  . Rash    Onset-1 and half weeks ago, darken area on middle chest region, denies any pain and has not been putting any cream on it.     HPI  Rash: noticed by brother about 10 days ago. Patient denies pruritus or pain on the site. No fever or chills. Normal appetite. Not spreading. She has other rashes but that are stable ( from picking on her skin)   Patient Active Problem List   Diagnosis Date Noted  . Pre-diabetes 01/05/2017  . Other symptoms and signs involving cognitive functions and awareness 09/01/2015  . Dyslipidemia 07/02/2015  . MR (mental retardation), severe 07/02/2015  . History of hyperthyroidism 07/02/2015  . History of radioactive iodine thyroid ablation 07/02/2015  . Hypothyroidism (acquired) 07/02/2015  . Obesity 07/02/2015  . Hypertension, benign 07/02/2015  . Anxiety 07/02/2015  . Vitamin D deficiency 07/02/2015  . Insomnia 07/02/2015  . OA (osteoarthritis) of knee 07/02/2015  . GERD (gastroesophageal reflux disease) 07/02/2015  . Hyperlipidemia, unspecified 07/02/2015    Social History   Tobacco Use  . Smoking status: Never Smoker  . Smokeless tobacco: Never Used  Substance Use Topics  . Alcohol use: No    Alcohol/week: 0.0 oz     Current Outpatient Medications:  .  acetaminophen (MAPAP) 500 MG tablet, Take 1 tablet (500 mg total) every 8 (eight) hours as needed by mouth., Disp: 90 tablet, Rfl: 1 .  atorvastatin (LIPITOR) 20 MG tablet, Take 1 tablet (20 mg total) by mouth daily., Disp: 30 tablet, Rfl: 5 .  Calcium-Vitamin D-Vitamin K [id number] MG-UNT-MCG CHEW, Chew 1 tablet by mouth 2 (two) times daily., Disp: 60 tablet, Rfl: 12 .  gabapentin (NEURONTIN) 300 MG capsule, Take 1 capsule (300 mg total) by mouth at bedtime., Disp: 30 capsule, Rfl: 5 .  levothyroxine (SYNTHROID,  LEVOTHROID) 100 MCG tablet, Take 1 tablet (100 mcg total) by mouth daily before breakfast., Disp: 30 tablet, Rfl: 5 .  LORazepam (ATIVAN) 0.5 MG tablet, Take 0.5 mg by mouth daily as needed for anxiety., Disp: , Rfl:  .  PARoxetine (PAXIL) 30 MG tablet, TAKE ONE TABLET BY MOUTH AT BEDTIME. (DEPRESSION), Disp: 30 tablet, Rfl: 10 .  Probiotic Product (PROBIOTIC-10 ULTIMATE) CAPS, TAKE ONE CAPSULE BY MOUTH EVERY DAY, Disp: 30 capsule, Rfl: 11 .  triamterene-hydrochlorothiazide (MAXZIDE-25) 37.5-25 MG tablet, TAKE 1/2 TABLET BY MOUTH EVERY DAY, Disp: 15 tablet, Rfl: 11 .  triamcinolone cream (KENALOG) 0.1 %, Apply 1 application topically 2 (two) times daily. For two weeks and stop, if no improvement return for follow up, Disp: 45 g, Rfl: 0  No Known Allergies  ROS  Ten systems reviewed and is negative except as mentioned in HPI   Objective  Vitals:   03/13/17 0901 03/13/17 0940  BP: 136/88   Pulse: (!) 114 98  Resp: 18   Temp: 98.4 F (36.9 C)   TempSrc: Oral   SpO2: 99%   Weight: 212 lb 12.8 oz (96.5 kg)   Height: 5\' 7"  (1.702 m)     Body mass index is 33.33 kg/m.    Physical Exam  Constitutional: Patient appears well-developed and well-nourished. ObeseNo distress.  HEENT: head atraumatic, normocephalic, pupils equal and reactive to light,  neck supple, throat within normal limits Cardiovascular: Normal rate, regular rhythm and  normal heart sounds.  No murmur heard. No BLE edema. Pulmonary/Chest: Effort normal and breath sounds normal. No respiratory distress. Abdominal: Soft.  There is no tenderness. Skin: hyperpigmentation on neck ( acanthosis nigricans) and also on anterior chest, with normal sensation, not well demarcated, no increase in warmth. Dry.  Psychiatric: Patient has a normal mood and affect. behavior is normal. Judgment and thought content normal.  Recent Results (from the past 2160 hour(s))  COMPLETE METABOLIC PANEL WITH GFR     Status: Abnormal   Collection  Time: 01/04/17  8:58 AM  Result Value Ref Range   Glucose, Bld 123 (H) 65 - 99 mg/dL    Comment: .            Fasting reference interval . For someone without known diabetes, a glucose value between 100 and 125 mg/dL is consistent with prediabetes and should be confirmed with a follow-up test. .    BUN 12 7 - 25 mg/dL   Creat 0.92 0.50 - 1.05 mg/dL    Comment: For patients >69 years of age, the reference limit for Creatinine is approximately 13% higher for people identified as African-American. .    GFR, Est Non African American 70 > OR = 60 mL/min/1.59m2   GFR, Est African American 81 > OR = 60 mL/min/1.38m2   BUN/Creatinine Ratio NOT APPLICABLE 6 - 22 (calc)   Sodium 142 135 - 146 mmol/L   Potassium 3.9 3.5 - 5.3 mmol/L   Chloride 103 98 - 110 mmol/L   CO2 29 20 - 32 mmol/L   Calcium 9.5 8.6 - 10.4 mg/dL   Total Protein 7.0 6.1 - 8.1 g/dL   Albumin 4.1 3.6 - 5.1 g/dL   Globulin 2.9 1.9 - 3.7 g/dL (calc)   AG Ratio 1.4 1.0 - 2.5 (calc)   Total Bilirubin 0.5 0.2 - 1.2 mg/dL   Alkaline phosphatase (APISO) 93 33 - 130 U/L   AST 19 10 - 35 U/L   ALT 25 6 - 29 U/L  Hemoglobin A1c     Status: Abnormal   Collection Time: 01/04/17  8:58 AM  Result Value Ref Range   Hgb A1c MFr Bld 6.0 (H) <5.7 % of total Hgb    Comment: For someone without known diabetes, a hemoglobin  A1c value between 5.7% and 6.4% is consistent with prediabetes and should be confirmed with a  follow-up test. . For someone with known diabetes, a value <7% indicates that their diabetes is well controlled. A1c targets should be individualized based on duration of diabetes, age, comorbid conditions, and other considerations. . This assay result is consistent with an increased risk of diabetes. . Currently, no consensus exists regarding use of hemoglobin A1c for diagnosis of diabetes for children. .    Mean Plasma Glucose 126 (calc)   eAG (mmol/L) 7.0 (calc)  Lipid panel     Status: None    Collection Time: 01/04/17  8:58 AM  Result Value Ref Range   Cholesterol 167 <200 mg/dL   HDL 67 >50 mg/dL   Triglycerides 85 <150 mg/dL   LDL Cholesterol (Calc) 82 mg/dL (calc)    Comment: Reference range: <100 . Desirable range <100 mg/dL for primary prevention;   <70 mg/dL for patients with CHD or diabetic patients  with > or = 2 CHD risk factors. Marland Kitchen LDL-C is now calculated using the Martin-Hopkins  calculation, which is a validated novel method providing  better accuracy than the Friedewald equation in the  estimation of LDL-C.  Cresenciano Genre et al. Annamaria Helling. 1443;154(00): 2061-2068  (http://education.[facility].com/faq/FAQ164)    Total CHOL/HDL Ratio 2.5 <5.0 (calc)   Non-HDL Cholesterol (Calc) 100 <130 mg/dL (calc)    Comment: For patients with diabetes plus 1 major ASCVD risk  factor, treating to a non-HDL-C goal of <100 mg/dL  (LDL-C of <70 mg/dL) is considered a therapeutic  option.   TSH     Status: None   Collection Time: 01/04/17  8:58 AM  Result Value Ref Range   TSH 2.05 mIU/L    Comment:           Reference Range .           > or = 20 Years  0.40-4.50 .                Pregnancy Ranges           First trimester    0.26-2.66           Second trimester   0.55-2.73           Third trimester    0.43-2.91   VITAMIN D 25 Hydroxy (Vit-D Deficiency, Fractures)     Status: None   Collection Time: 01/04/17  8:58 AM  Result Value Ref Range   Vit D, 25-Hydroxy 47 30 - 100 ng/mL    Comment: Vitamin D Status         25-OH Vitamin D: . Deficiency:                    <20 ng/mL Insufficiency:             20 - 29 ng/mL Optimal:                 > or = 30 ng/mL . For 25-OH Vitamin D testing on patients on  D2-supplementation and patients for whom quantitation  of D2 and D3 fractions is required, the [facility]ureD(TM) 25-OH VIT D, (D2,D3), LC/MS/MS is recommended: order  code 805-002-9296 (patients >33yrs). . For more information on this test, go  to: http://education.[facility].com/faq/FAQ163 (This link is being provided for  informational/educational purposes only.)      Assessment & Plan  1. Rash  Possible acanthosis versus eczema we will try topical medication for short term and monitor, return if no improvement  - triamcinolone cream (KENALOG) 0.1 %; Apply 1 application topically 2 (two) times daily. For two weeks and stop, if no improvement return for follow up  Dispense: 45 g; Refill: 0

## 2017-07-06 ENCOUNTER — Ambulatory Visit: Payer: Self-pay | Admitting: Family Medicine

## 2017-07-11 ENCOUNTER — Encounter: Payer: Self-pay | Admitting: Family Medicine

## 2017-07-11 ENCOUNTER — Ambulatory Visit (INDEPENDENT_AMBULATORY_CARE_PROVIDER_SITE_OTHER): Payer: Medicare Other

## 2017-07-11 ENCOUNTER — Ambulatory Visit (INDEPENDENT_AMBULATORY_CARE_PROVIDER_SITE_OTHER): Payer: Medicare Other | Admitting: Family Medicine

## 2017-07-11 VITALS — BP 124/90 | HR 101 | Temp 97.6°F | Resp 12 | Ht 67.0 in | Wt 208.1 lb

## 2017-07-11 VITALS — BP 144/82 | HR 101 | Temp 97.6°F | Resp 12 | Ht 67.0 in | Wt 208.1 lb

## 2017-07-11 DIAGNOSIS — E785 Hyperlipidemia, unspecified: Secondary | ICD-10-CM

## 2017-07-11 DIAGNOSIS — E039 Hypothyroidism, unspecified: Secondary | ICD-10-CM | POA: Diagnosis not present

## 2017-07-11 DIAGNOSIS — R7303 Prediabetes: Secondary | ICD-10-CM | POA: Diagnosis not present

## 2017-07-11 DIAGNOSIS — I10 Essential (primary) hypertension: Secondary | ICD-10-CM | POA: Diagnosis not present

## 2017-07-11 DIAGNOSIS — Z923 Personal history of irradiation: Secondary | ICD-10-CM | POA: Diagnosis not present

## 2017-07-11 DIAGNOSIS — Z0000 Encounter for general adult medical examination without abnormal findings: Secondary | ICD-10-CM | POA: Diagnosis not present

## 2017-07-11 NOTE — Patient Instructions (Signed)
Rebecca Bruce , Thank you for taking time to come for your Medicare Wellness Visit. I appreciate your ongoing commitment to your health goals. Please review the following plan we discussed and let me know if I can assist you in the future.   Screening recommendations/referrals: Colorectal Screening: Not required until after age 56 Mammogram: Completed mammogram 11/11/16. Repeat every year. Bone Density: Not required until after age 88 Lung Cancer Screening: You do not qualify for this screening Hepatitis C Screening: Completed 01/31/12  Vision and Dental Exams: Recommended annual ophthalmology exams for early detection of glaucoma and other disorders of the eye Recommended annual dental exams for proper oral hygiene  Vaccinations: Influenza vaccine: Up to date Pneumococcal vaccine: Not required until after age 12 Tdap vaccine: Up to date Shingles vaccine: Not required until after age 57    Advanced directives: Please bring a copy of your POA (Power of [place]) and/or Living Will to your next appointment.  Conditions/risks identified: Recommend to drink at least 6-8 8oz glasses of water per day.  Next appointment: Please schedule your Annual Wellness Visit with your Nurse Health Advisor in one year.  Preventive Care 40-64 Years, Female Preventive care refers to lifestyle choices and visits with your health care provider that can promote health and wellness. What does preventive care include?  A yearly physical exam. This is also called an annual well check.  Dental exams once or twice a year.  Routine eye exams. Ask your health care provider how often you should have your eyes checked.  Personal lifestyle choices, including:  Daily care of your teeth and gums.  Regular physical activity.  Eating a healthy diet.  Avoiding tobacco and drug use.  Limiting alcohol use.  Practicing safe sex.  Taking low-dose aspirin daily starting at age 64.  Taking vitamin and mineral  supplements as recommended by your health care provider. What happens during an annual well check? The services and screenings done by your health care provider during your annual well check will depend on your age, overall health, lifestyle risk factors, and family history of disease. Counseling  Your health care provider may ask you questions about your:  Alcohol use.  Tobacco use.  Drug use.  Emotional well-being.  Home and relationship well-being.  Sexual activity.  Eating habits.  Work and work Statistician.  Method of birth control.  Menstrual cycle.  Pregnancy history. Screening  You may have the following tests or measurements:  Height, weight, and BMI.  Blood pressure.  Lipid and cholesterol levels. These may be checked every 5 years, or more frequently if you are over 1 years old.  Skin check.  Lung cancer screening. You may have this screening every year starting at age 29 if you have a 30-pack-year history of smoking and currently smoke or have quit within the past 15 years.  Fecal occult blood test (FOBT) of the stool. You may have this test every year starting at age 11.  Flexible sigmoidoscopy or colonoscopy. You may have a sigmoidoscopy every 5 years or a colonoscopy every 10 years starting at age 67.  Hepatitis C blood test.  Hepatitis B blood test.  Sexually transmitted disease (STD) testing.  Diabetes screening. This is done by checking your blood sugar (glucose) after you have not eaten for a while (fasting). You may have this done every 1-3 years.  Mammogram. This may be done every 1-2 years. Talk to your health care provider about when you should start having regular mammograms. This may  depend on whether you have a family history of breast cancer.  BRCA-related cancer screening. This may be done if you have a family history of breast, ovarian, tubal, or peritoneal cancers.  Pelvic exam and Pap test. This may be done every 3 years starting  at age 52. Starting at age 24, this may be done every 5 years if you have a Pap test in combination with an HPV test.  Bone density scan. This is done to screen for osteoporosis. You may have this scan if you are at high risk for osteoporosis. Discuss your test results, treatment options, and if necessary, the need for more tests with your health care provider. Vaccines  Your health care provider may recommend certain vaccines, such as:  Influenza vaccine. This is recommended every year.  Tetanus, diphtheria, and acellular pertussis (Tdap, Td) vaccine. You may need a Td booster every 10 years.  Zoster vaccine. You may need this after age 45.  Pneumococcal 13-valent conjugate (PCV13) vaccine. You may need this if you have certain conditions and were not previously vaccinated.  Pneumococcal polysaccharide (PPSV23) vaccine. You may need one or two doses if you smoke cigarettes or if you have certain conditions. Talk to your health care provider about which screenings and vaccines you need and how often you need them. This information is not intended to replace advice given to you by your health care provider. Make sure you discuss any questions you have with your health care provider. Document Released: 04/24/2015 Document Revised: 12/16/2015 Document Reviewed: 01/27/2015 Elsevier Interactive Patient Education  2017 [place] Prevention in the Home Falls can cause injuries. They can happen to people of all ages. There are many things you can do to make your home safe and to help prevent falls. What can I do on the outside of my home?  Regularly fix the edges of walkways and driveways and fix any cracks.  Remove anything that might make you trip as you walk through a door, such as a raised step or threshold.  Trim any bushes or trees on the path to your home.  Use bright outdoor lighting.  Clear any walking paths of anything that might make someone trip, such as rocks or  tools.  Regularly check to see if handrails are loose or broken. Make sure that both sides of any steps have handrails.  Any raised decks and porches should have guardrails on the edges.  Have any leaves, snow, or ice cleared regularly.  Use sand or salt on walking paths during winter.  Clean up any spills in your garage right away. This includes oil or grease spills. What can I do in the bathroom?  Use night lights.  Install grab bars by the toilet and in the tub and shower. Do not use towel bars as grab bars.  Use non-skid mats or decals in the tub or shower.  If you need to sit down in the shower, use a plastic, non-slip stool.  Keep the floor dry. Clean up any water that spills on the floor as soon as it happens.  Remove soap buildup in the tub or shower regularly.  Attach bath mats securely with double-sided non-slip rug tape.  Do not have throw rugs and other things on the floor that can make you trip. What can I do in the bedroom?  Use night lights.  Make sure that you have a light by your bed that is easy to reach.  Do not use  any sheets or blankets that are too big for your bed. They should not hang down onto the floor.  Have a firm chair that has side arms. You can use this for support while you get dressed.  Do not have throw rugs and other things on the floor that can make you trip. What can I do in the kitchen?  Clean up any spills right away.  Avoid walking on wet floors.  Keep items that you use a lot in easy-to-reach places.  If you need to reach something above you, use a strong step stool that has a grab bar.  Keep electrical cords out of the way.  Do not use floor polish or wax that makes floors slippery. If you must use wax, use non-skid floor wax.  Do not have throw rugs and other things on the floor that can make you trip. What can I do with my stairs?  Do not leave any items on the stairs.  Make sure that there are handrails on both  sides of the stairs and use them. Fix handrails that are broken or loose. Make sure that handrails are as long as the stairways.  Check any carpeting to make sure that it is firmly attached to the stairs. Fix any carpet that is loose or worn.  Avoid having throw rugs at the top or bottom of the stairs. If you do have throw rugs, attach them to the floor with carpet tape.  Make sure that you have a light switch at the top of the stairs and the bottom of the stairs. If you do not have them, ask someone to add them for you. What else can I do to help prevent falls?  Wear shoes that:  Do not have high heels.  Have rubber bottoms.  Are comfortable and fit you well.  Are closed at the toe. Do not wear sandals.  If you use a stepladder:  Make sure that it is fully opened. Do not climb a closed stepladder.  Make sure that both sides of the stepladder are locked into place.  Ask someone to hold it for you, if possible.  Clearly mark and make sure that you can see:  Any grab bars or handrails.  First and last steps.  Where the edge of each step is.  Use tools that help you move around (mobility aids) if they are needed. These include:  Canes.  Walkers.  Scooters.  Crutches.  Turn on the lights when you go into a dark area. Replace any light bulbs as soon as they burn out.  Set up your furniture so you have a clear path. Avoid moving your furniture around.  If any of your floors are uneven, fix them.  If there are any pets around you, be aware of where they are.  Review your medicines with your doctor. Some medicines can make you feel dizzy. This can increase your chance of falling. Ask your doctor what other things that you can do to help prevent falls. This information is not intended to replace advice given to you by your health care provider. Make sure you discuss any questions you have with your health care provider. Document Released: 01/22/2009 Document Revised:  09/03/2015 Document Reviewed: 05/02/2014 Elsevier Interactive Patient Education  2017 [employer].

## 2017-07-11 NOTE — Progress Notes (Signed)
Subjective:   Rebecca Bruce is a 56 y.o. female who presents for Medicare Annual (Subsequent) preventive examination.  Review of Systems:  N/A Cardiac Risk Factors include: dyslipidemia;hypertension;obesity (BMI >30kg/m2)     Objective:     Vitals: BP (!) 144/82 (BP Location: Left Arm, Patient Position: Sitting, Cuff Size: Large)   Pulse (!) 101   Temp 97.6 F (36.4 C) (Oral)   Resp 12   Ht 5\' 7"  (1.702 m)   Wt 208 lb 1.6 oz (94.4 kg)   SpO2 95%   BMI 32.59 kg/m   Body mass index is 32.59 kg/m.  Advanced Directives 07/11/2017 07/05/2016 03/10/2016 10/20/2015 07/02/2015 12/16/2014  Does Patient Have a Medical Advance Directive? Yes No No No No No;Yes  Type of Paramedic of [place];Living will - - - - Press photographer  Does patient want to make changes to medical advance directive? - - - - - No - Patient declined  Copy of [place] in Chart? No - copy requested - - - - No - copy requested  Would patient like information on creating a medical advance directive? - - - - - No - patient declined information    Tobacco Social History   Tobacco Use  Smoking Status Never Smoker  Smokeless Tobacco Never Used  Tobacco Comment   smoking cessation materials not required     Counseling given: No Comment: smoking cessation materials not required   Clinical Intake:  Pre-visit preparation completed: Yes  Pain : No/denies pain   BMI - recorded: 32.59 Nutritional Status: BMI > 30  Obese Nutritional Risks: None Diabetes: No  How often do you need to have someone help you when you read instructions, pamphlets, or other written materials from your doctor or pharmacy?: 5 - Always(Mental Retardation)  Interpreter Needed?: No  Information entered by :: [place], LPN  Hospitalizations, surgeries, and ER visits occurring within the previous 12 months:  Within the previous 12 months, pt has not underwent any surgical  procedures, has not been hospitalized for any conditions and has not been treated by an emergency room clinician.  Past Medical History:  Diagnosis Date  . GERD (gastroesophageal reflux disease)   . Hypertension   . Thyroid disease    Past Surgical History:  Procedure Laterality Date  . DENTAL SURGERY     Family History  Problem Relation Age of Onset  . Heart disease Mother   . Breast cancer Neg Hx    Poor historian to family history listed above. H/o mental retardation  Social History   Socioeconomic History  . Marital status: Single    Spouse name: Not on file  . Number of children: 0  . Years of education: Not on file  . Highest education level: Not on file  Occupational History  . Occupation: Disabled  Social Needs  . Financial resource strain: Not hard at all  . Food insecurity:    Worry: Never true    Inability: Never true  . Transportation needs:    Medical: No    Non-medical: No  Tobacco Use  . Smoking status: Never Smoker  . Smokeless tobacco: Never Used  . Tobacco comment: smoking cessation materials not required  Substance and Sexual Activity  . Alcohol use: No    Alcohol/week: 0.0 oz  . Drug use: No  . Sexual activity: Never  Lifestyle  . Physical activity:    Days per week: 7 days    Minutes per  session: 90 min  . Stress: Not at all  Relationships  . Social connections:    Talks on phone: Patient refused    Gets together: Patient refused    Attends religious service: Patient refused    Active member of club or organization: Patient refused    Attends meetings of clubs or organizations: Patient refused    Relationship status: Patient refused  Other Topics Concern  . Not on file  Social History Narrative   Stays At [facility], [facility]    Outpatient Encounter Medications as of 07/11/2017  Medication Sig  . acetaminophen (MAPAP) 500 MG tablet Take 1 tablet (500 mg total) every 8 (eight) hours as needed by mouth.  Marland Kitchen  atorvastatin (LIPITOR) 20 MG tablet Take 1 tablet (20 mg total) by mouth daily.  . Calcium-Vitamin D-Vitamin K 500-500-40 MG-UNT-MCG CHEW Chew 1 tablet by mouth 2 (two) times daily.  Marland Kitchen gabapentin (NEURONTIN) 300 MG capsule Take 1 capsule (300 mg total) by mouth at bedtime.  Marland Kitchen levothyroxine (SYNTHROID, LEVOTHROID) 100 MCG tablet Take 1 tablet (100 mcg total) by mouth daily before breakfast.  . LORazepam (ATIVAN) 0.5 MG tablet Take 0.5 mg by mouth daily as needed for anxiety.  Marland Kitchen PARoxetine (PAXIL) 30 MG tablet TAKE ONE TABLET BY MOUTH AT BEDTIME. (DEPRESSION)  . Probiotic Product (PROBIOTIC-10 ULTIMATE) CAPS TAKE ONE CAPSULE BY MOUTH EVERY DAY  . triamcinolone cream (KENALOG) 0.1 % Apply 1 application topically 2 (two) times daily. For two weeks and stop, if no improvement return for follow up  . triamterene-hydrochlorothiazide (MAXZIDE-25) 37.5-25 MG tablet TAKE 1/2 TABLET BY MOUTH EVERY DAY   No facility-administered encounter medications on file as of 07/11/2017.     Activities of Daily Living In your present state of health, do you have any difficulty performing the following activities: 07/11/2017 03/13/2017  Hearing? N N  Comment denies hearing aids -  Vision? N N  Comment denies weraing glasses -  Difficulty concentrating or making decisions? N Y  Walking or climbing stairs? Y N  Comment pain in knees -  Dressing or bathing? N N  Doing errands, shopping? Tempie Donning  Comment staff transports -  Conservation officer, nature and eating ? N -  Comment denies dentures -  Using the Toilet? N -  In the past six months, have you accidently leaked urine? N -  Do you have problems with loss of bowel control? N -  Managing your Medications? N -  Comment nursing manages medications at the facility -  Managing your Finances? [place] manages finances -  Housekeeping or managing your Housekeeping? N -  Some recent data might be hidden    Patient Care Team: Steele Sizer, MD as PCP - General  (Family Medicine)    Assessment:   This is a routine wellness examination for Rebecca Bruce.  Exercise Activities and Dietary recommendations Current Exercise Habits: Home exercise routine, Type of exercise: walking;Other - see comments(dancing at group home), Time (Minutes): > 60, Frequency (Times/Week): 7, Weekly Exercise (Minutes/Week): 0, Intensity: Mild, Exercise limited by: Other - see comments(severe intellectual disorder)  Goals    . DIET - INCREASE WATER INTAKE     Recommend to drink at least 6-8 8oz glasses of water per day.       Fall Risk Fall Risk  07/11/2017 03/13/2017 08/23/2016 01/20/2016 10/20/2015  Falls in the past year? No No No No No  Risk for fall due to : Other (Comment);Medication side effect - - - -  Risk for fall due to: Comment Mental retardation - - - -   Is the home free of loose throw rugs in walkways, pet beds, electrical cords, etc? Yes Adequate lighting to reduce risk of falls?  Yes In addition, does the patient have any of the following: Stairs in or around the home WITH handrails? N/A, stays in a group home Grab bars in the bathroom? Yes  Shower chair or a place to sit while bathing? Yes Use of a cane, walker or w/c? No Use of an elevated toilet seat or a handicapped toilet? No, stays in a group home  Timed Get Up and Go Performed: Yes. Pt ambulated 10 feet within 20 sec. Gait Gait slow, steady and without the use of an assistive device. No intervention required at this time. Fall risk prevention has been discussed.  Community Resource Referral not required at this time. Stays in a group home  Depression Screen PHQ 2/9 Scores 07/11/2017 03/13/2017 01/20/2016 10/20/2015  PHQ - 2 Score 0 0 0 0  PHQ- 9 Score 0 - - -     Cognitive Function - Unable to perform d/t Mental Retardation        Immunization History  Administered Date(s) Administered  . Influenza Split 01/26/2010  . Influenza, Seasonal, Injecte, Preservative Fre 01/31/2012, 11/29/2012  .  Influenza,inj,Quad PF,6+ Mos 02/13/2014, 12/16/2014, 01/20/2016, 12/28/2016  . Influenza-Unspecified 12/16/2014, 01/20/2016  . Pneumococcal Conjugate-13 12/16/2014  . Tdap 05/30/2012    Qualifies for Shingles Vaccine? No  Screening Tests Health Maintenance  Topic Date Due  . PAP SMEAR  03/28/2019 (Originally 11/02/1982)  . COLONOSCOPY  04/04/2019 (Originally 11/02/2011)  . INFLUENZA VACCINE  11/09/2017  . MAMMOGRAM  11/11/2017  . TETANUS/TDAP  05/30/2022  . Hepatitis C Screening  Completed  . HIV Screening  Discontinued    Cancer Screenings: Lung: Low Dose CT Chest recommended if Age 66-80 years, 30 pack-year currently smoking OR have quit w/in 15years. Patient does not qualify. Breast:  Up to date on Mammogram? Yes. Completed mammogram 11/11/16. Repeat every year. Up to date of Bone Density/Dexa? No. Osteoporotic screenings not required until after age 58 Colorectal: Not required until after age 74.  Additional Screenings: Hepatitis B/HIV/Syphillis: Does not qualify Hepatitis C Screening: Completed 01/31/12     Plan:  I have personally reviewed and addressed the Medicare Annual Wellness questionnaire and have noted the following in the patient's chart:  A. Medical and social history B. Use of alcohol, tobacco or illicit drugs  C. Current medications and supplements D. Functional ability and status E.  Nutritional status F.  Physical activity G. Advance directives H. List of other physicians I.  Hospitalizations, surgeries, and ER visits in previous 12 months J.  [place] such as hearing and vision if needed, cognitive and depression L. Referrals and appointments  In addition, I have reviewed and discussed with patient certain preventive protocols, quality metrics, and best practice recommendations. A written personalized care plan for preventive services as well as general preventive health recommendations were provided to patient.  See attached scanned  questionnaire for additional information.   Signed,  Aleatha Borer, LPN Nurse Health Advisor

## 2017-07-11 NOTE — Progress Notes (Signed)
Name: Rebecca Bruce   MRN: [id number]    DOB: [birth year]   Date:07/11/2017       Progress Note  Subjective  Chief Complaint  Chief Complaint  Patient presents with  . Follow-up    HPI  She came in with caregiver: Verdene Lennert   HTN: taking medication as prescribed, no chest pain or palpitation, bp is at goal at home, but slightly elevated today at our office. Caregiver states anxious before visit  Hyperlipidemia: taking medication as prescribed and no problems, denies myalgias. She lives in a group home and likes to walk, but eats fast, she does have a lot of fried food  Hypothyroidism : used to see  Dr. Rosario Jacks, s/p ablation for hyperthyroidism, doing well on Synthroid 100 mcg daily, we will repeat levels today. No constipation, but she has dry skin. She denies dysphagia.   Obesity: she lost 5 lbs since last visit, she has been walking daily, she likes to move, she has co-morbidities and discuss importance of portion control and to continue to lose some weight.   MR: stable, sees psychiatrist for her behavior, she lives in a group home, her brother sees her once a week and takes her to church. She needs assistance bathing and dressing, managing her money.   Pre-diabetes: hgbA1C was 6.3%  In 2017, down to 6.0% end of 2018 , she is only eating a couple of times a week. She denies polyphagia, polydipsia or polyuria. We will recheck labs    Patient Active Problem List   Diagnosis Date Noted  . Pre-diabetes 01/05/2017  . Other symptoms and signs involving cognitive functions and awareness 09/01/2015  . Dyslipidemia 07/02/2015  . MR (mental retardation), severe 07/02/2015  . History of hyperthyroidism 07/02/2015  . History of radioactive iodine thyroid ablation 07/02/2015  . Hypothyroidism (acquired) 07/02/2015  . Obesity 07/02/2015  . Hypertension, benign 07/02/2015  . Anxiety 07/02/2015  . Vitamin D deficiency 07/02/2015  . Insomnia 07/02/2015  . OA (osteoarthritis) of  knee 07/02/2015  . GERD (gastroesophageal reflux disease) 07/02/2015  . Hyperlipidemia, unspecified 07/02/2015    Past Surgical History:  Procedure Laterality Date  . DENTAL SURGERY      Family History  Problem Relation Age of Onset  . Heart disease Mother   . Breast cancer Neg Hx     Social History   Socioeconomic History  . Marital status: Single    Spouse name: Not on file  . Number of children: 0  . Years of education: Not on file  . Highest education level: Not on file  Occupational History  . Occupation: Disabled  Social Needs  . Financial resource strain: Not hard at all  . Food insecurity:    Worry: Never true    Inability: Never true  . Transportation needs:    Medical: No    Non-medical: No  Tobacco Use  . Smoking status: Never Smoker  . Smokeless tobacco: Never Used  . Tobacco comment: smoking cessation materials not required  Substance and Sexual Activity  . Alcohol use: No    Alcohol/week: 0.0 oz  . Drug use: No  . Sexual activity: Never  Lifestyle  . Physical activity:    Days per week: 7 days    Minutes per session: 90 min  . Stress: Not at all  Relationships  . Social connections:    Talks on phone: Once a week    Gets together: Once a week    Attends religious service: More than 4 times  per year    Active member of club or organization: Yes    Attends meetings of clubs or organizations: More than 4 times per year    Relationship status: Never married  . Intimate partner violence:    Fear of current or ex partner: No    Emotionally abused: No    Physically abused: No    Forced sexual activity: No  Other Topics Concern  . Not on file  Social History Narrative   Stays At [employer]   She goes to church with her brother every Sunday   Participate in group activities   Works part time at the [employer] program      Current Outpatient Medications:  .  acetaminophen (MAPAP) 500 MG tablet, Take 1 tablet (500 mg total)  every 8 (eight) hours as needed by mouth., Disp: 90 tablet, Rfl: 1 .  atorvastatin (LIPITOR) 20 MG tablet, Take 1 tablet (20 mg total) by mouth daily., Disp: 30 tablet, Rfl: 5 .  Calcium-Vitamin D-Vitamin K [id number] MG-UNT-MCG CHEW, Chew 1 tablet by mouth 2 (two) times daily., Disp: 60 tablet, Rfl: 12 .  gabapentin (NEURONTIN) 300 MG capsule, Take 1 capsule (300 mg total) by mouth at bedtime., Disp: 30 capsule, Rfl: 5 .  levothyroxine (SYNTHROID, LEVOTHROID) 100 MCG tablet, Take 1 tablet (100 mcg total) by mouth daily before breakfast., Disp: 30 tablet, Rfl: 5 .  LORazepam (ATIVAN) 0.5 MG tablet, Take 0.5 mg by mouth daily as needed for anxiety., Disp: , Rfl:  .  PARoxetine (PAXIL) 30 MG tablet, TAKE ONE TABLET BY MOUTH AT BEDTIME. (DEPRESSION), Disp: 30 tablet, Rfl: 10 .  Probiotic Product (PROBIOTIC-10 ULTIMATE) CAPS, TAKE ONE CAPSULE BY MOUTH EVERY DAY, Disp: 30 capsule, Rfl: 11 .  triamcinolone cream (KENALOG) 0.1 %, Apply 1 application topically 2 (two) times daily. For two weeks and stop, if no improvement return for follow up, Disp: 45 g, Rfl: 0 .  triamterene-hydrochlorothiazide (MAXZIDE-25) 37.5-25 MG tablet, TAKE 1/2 TABLET BY MOUTH EVERY DAY, Disp: 15 tablet, Rfl: 11  No Known Allergies   ROS  Constitutional: Negative for fever or weight change.  Respiratory: Negative for cough and shortness of breath.   Cardiovascular: Negative for chest pain or palpitations.  Gastrointestinal: Negative for abdominal pain, no bowel changes.  Musculoskeletal: Positive for intermittent  gait problem but no  joint swelling.  Skin: Negative for rash but has dry skin  Neurological: Negative for dizziness or headache.  No other specific complaints in a complete review of systems (except as listed in HPI above).  Objective  Vitals:   07/11/17 0916  BP: (!) 144/82  Pulse: (!) 101  Resp: 12  Temp: 97.6 F (36.4 C)  TempSrc: Oral  SpO2: 95%  Weight: 208 lb 1.6 oz (94.4 kg)  Height: 5\' 7"   (1.702 m)    Body mass index is 32.59 kg/m.  Physical Exam  Constitutional: Patient appears well-developed and well-nourished. Obese  No distress.  HEENT: head atraumatic, normocephalic, pupils equal and reactive to light, neck supple, throat within normal limits Cardiovascular: Normal rate, regular rhythm and normal heart sounds.  2/6  murmur heard. No BLE edema. Pulmonary/Chest: Effort normal and breath sounds normal. No respiratory distress. Abdominal: Soft.  There is no tenderness. Psychiatric: Patient has a normal mood and affect. Cooperative.   PHQ2/9: Depression screen [facility] 2/9 07/11/2017 03/13/2017 01/20/2016 10/20/2015 12/16/2014  Decreased Interest 0 0 0 0 0  Down, Depressed, Hopeless 0 0 0 0 0  PHQ -  2 Score 0 0 0 0 0  Altered sleeping 0 - - - -  Tired, decreased energy 0 - - - -  Change in appetite 0 - - - -  Feeling bad or failure about yourself  0 - - - -  Trouble concentrating 0 - - - -  Moving slowly or fidgety/restless 0 - - - -  Suicidal thoughts 0 - - - -  PHQ-9 Score 0 - - - -  Difficult doing work/chores Not difficult at all - - - -     Fall Risk: Fall Risk  07/11/2017 03/13/2017 08/23/2016 01/20/2016 10/20/2015  Falls in the past year? No No No No No  Risk for fall due to : Other (Comment);Medication side effect - - - -  Risk for fall due to: Comment Mental retardation - - - -       Assessment & Plan  1. Hypothyroidism (acquired)  - TSH  2. Hypertension, benign  Elevated today, caregiver states she gets nervous prior to doctor's visit. BP at group home has been controlled 130's/70's - COMPLETE METABOLIC PANEL WITH GFR  3. Pre-diabetes  - Hemoglobin A1c  4. History of radioactive iodine thyroid ablation  - TSH  5. Dyslipidemia  On statin therapy   6. Morbid obesity ([place])  Discussed with the patient the risk posed by an increased BMI. Discussed importance of portion control, calorie counting and at least 150 minutes of physical activity  weekly. Avoid sweet beverages and drink more water. Eat at least 6 servings of fruit and vegetables daily

## 2017-07-12 LAB — COMPLETE METABOLIC PANEL WITH GFR
AG RATIO: 1.1 (calc) (ref 1.0–2.5)
ALBUMIN MSPROF: 4 g/dL (ref 3.6–5.1)
ALKALINE PHOSPHATASE (APISO): 110 U/L (ref 33–130)
ALT: 16 U/L (ref 6–29)
AST: 15 U/L (ref 10–35)
BUN: 11 mg/dL (ref 7–25)
CHLORIDE: 104 mmol/L (ref 98–110)
CO2: 29 mmol/L (ref 20–32)
Calcium: 9.7 mg/dL (ref 8.6–10.4)
Creat: 0.9 mg/dL (ref 0.50–1.05)
GFR, Est African American: 83 mL/min/{1.73_m2} (ref >=60)
GFR, Est Non African American: 72 mL/min/{1.73_m2} (ref >=60)
GLOBULIN: 3.5 g/dL (ref 1.9–3.7)
GLUCOSE: 113 mg/dL (ref 65–139)
Potassium: 3.8 mmol/L (ref 3.5–5.3)
SODIUM: 140 mmol/L (ref 135–146)
Total Bilirubin: 0.6 mg/dL (ref 0.2–1.2)
Total Protein: 7.5 g/dL (ref 6.1–8.1)

## 2017-07-12 LAB — HEMOGLOBIN A1C
HEMOGLOBIN A1C: 6 %{Hb} — AB (ref <5.7)
MEAN PLASMA GLUCOSE: 126 (calc)
eAG (mmol/L): 7 (calc)

## 2017-07-12 LAB — TSH: TSH: 1.91 mIU/L

## 2017-07-14 ENCOUNTER — Other Ambulatory Visit: Payer: Self-pay

## 2017-07-14 DIAGNOSIS — L298 Other pruritus: Secondary | ICD-10-CM | POA: Diagnosis not present

## 2017-07-14 MED ORDER — LEVOTHYROXINE SODIUM 100 MCG PO TABS: 100.0000 ug | ORAL_TABLET | Freq: Every day | ORAL | 5 refills | Status: DC

## 2017-07-14 NOTE — Telephone Encounter (Signed)
Refill request for thyroid medication. Levothyroxine to [facility].   Last visit: 07/11/2017   Lab Results  Component Value Date   TSH 1.91 07/11/2017    Follow up on 07/13/2018

## 2017-08-09 ENCOUNTER — Other Ambulatory Visit: Payer: Self-pay | Admitting: Family Medicine

## 2017-08-09 DIAGNOSIS — E785 Hyperlipidemia, unspecified: Secondary | ICD-10-CM

## 2017-08-09 NOTE — Telephone Encounter (Signed)
Refill request for general medication. Gabapentin and Atorvastatin.   Last office visit: 07/11/17   Follow up 07/13/2018

## 2017-08-10 NOTE — Telephone Encounter (Signed)
Needs follow up in 3 months

## 2017-08-10 NOTE — Telephone Encounter (Signed)
appt scheduled for 11-13-17 with dr Ancil Boozer

## 2017-08-15 DIAGNOSIS — F339 Major depressive disorder, recurrent, unspecified: Secondary | ICD-10-CM | POA: Diagnosis not present

## 2017-08-31 ENCOUNTER — Ambulatory Visit (INDEPENDENT_AMBULATORY_CARE_PROVIDER_SITE_OTHER): Payer: Medicare Other | Admitting: Nurse Practitioner

## 2017-08-31 ENCOUNTER — Encounter: Payer: Self-pay | Admitting: Nurse Practitioner

## 2017-08-31 VITALS — BP 126/82 | HR 82 | Temp 98.2°F | Resp 16 | Ht 67.0 in | Wt 208.9 lb

## 2017-08-31 DIAGNOSIS — D489 Neoplasm of uncertain behavior, unspecified: Secondary | ICD-10-CM

## 2017-08-31 DIAGNOSIS — L282 Other prurigo: Secondary | ICD-10-CM

## 2017-08-31 MED ORDER — HYDROCORTISONE 0.5 % EX CREA: 1.0000 "application " | TOPICAL_CREAM | Freq: Two times a day (BID) | CUTANEOUS | 0 refills | Status: DC

## 2017-08-31 NOTE — Progress Notes (Addendum)
Name: Rebecca Bruce   MRN: [id number]    DOB: 1960-08-18   Date:08/31/2017       Progress Note  Subjective  Chief Complaint  Chief Complaint  Patient presents with  . Rash    black spot on left leg, itching and irritated    HPI  Patient from [facility] group home presents with [employer] member due to black spot on left leg increasing in size, notes itching and mild discomfort in area not sure if its been there before the 4-5 weeks of itching.  Was seen at [facility] clinic on 4/5 on left arm for pruritic erythematous rash and rx. triamcinolone cream for itching- tried it on the leg but has not helped with itching.  Denies fevers, chills, fatigue, no know bug or tick bites, no one else at [facility] has similar bites.   Patient Active Problem List   Diagnosis Date Noted  . Pre-diabetes 01/05/2017  . Other symptoms and signs involving cognitive functions and awareness 09/01/2015  . Dyslipidemia 07/02/2015  . MR (mental retardation), severe 07/02/2015  . History of hyperthyroidism 07/02/2015  . History of radioactive iodine thyroid ablation 07/02/2015  . Hypothyroidism (acquired) 07/02/2015  . Obesity 07/02/2015  . Hypertension, benign 07/02/2015  . Anxiety 07/02/2015  . Vitamin D deficiency 07/02/2015  . Insomnia 07/02/2015  . OA (osteoarthritis) of knee 07/02/2015  . GERD (gastroesophageal reflux disease) 07/02/2015  . Hyperlipidemia, unspecified 07/02/2015    Past Medical History:  Diagnosis Date  . GERD (gastroesophageal reflux disease)   . Hypertension   . Thyroid disease     Past Surgical History:  Procedure Laterality Date  . DENTAL SURGERY      Social History   Tobacco Use  . Smoking status: Never Smoker  . Smokeless tobacco: Never Used  . Tobacco comment: smoking cessation materials not required  Substance Use Topics  . Alcohol use: No    Alcohol/week: 0.0 oz     Current Outpatient Medications:  .  acetaminophen (MAPAP) 500 MG tablet, Take 1  tablet (500 mg total) every 8 (eight) hours as needed by mouth., Disp: 90 tablet, Rfl: 1 .  atorvastatin (LIPITOR) 20 MG tablet, TAKE ONE TABLET BY MOUTH AT BEDTIME. (IMPROVES CHOLESTEROL), Disp: 30 tablet, Rfl: 2 .  Calcium-Vitamin D-Vitamin K [id number] MG-UNT-MCG CHEW, Chew 1 tablet by mouth 2 (two) times daily., Disp: 60 tablet, Rfl: 12 .  gabapentin (NEURONTIN) 300 MG capsule, TAKE ONE CAPSULE BY MOUTH AT BEDTIME., Disp: 30 capsule, Rfl: 2 .  levothyroxine (SYNTHROID, LEVOTHROID) 100 MCG tablet, Take 1 tablet (100 mcg total) by mouth daily before breakfast., Disp: 30 tablet, Rfl: 5 .  LORazepam (ATIVAN) 0.5 MG tablet, Take 0.5 mg by mouth daily as needed for anxiety., Disp: , Rfl:  .  PARoxetine (PAXIL) 30 MG tablet, TAKE ONE TABLET BY MOUTH AT BEDTIME. (DEPRESSION), Disp: 30 tablet, Rfl: 10 .  Probiotic Product (PROBIOTIC-10 ULTIMATE) CAPS, TAKE ONE CAPSULE BY MOUTH EVERY DAY, Disp: 30 capsule, Rfl: 11 .  triamcinolone cream (KENALOG) 0.1 %, Apply 1 application topically 2 (two) times daily. For two weeks and stop, if no improvement return for follow up, Disp: 45 g, Rfl: 0 .  triamterene-hydrochlorothiazide (MAXZIDE-25) 37.5-25 MG tablet, TAKE 1/2 TABLET BY MOUTH EVERY DAY, Disp: 15 tablet, Rfl: 11 .  hydrocortisone cream 0.5 %, Apply 1 application topically 2 (two) times daily., Disp: 30 g, Rfl: 0  No Known Allergies  ROS  No other specific complaints in a complete review of systems (except as  listed in HPI above).  Objective  Vitals:   08/31/17 0947  BP: 126/82  Pulse: 82  Resp: 16  Temp: 98.2 F (36.8 C)  TempSrc: Oral  SpO2: 97%  Weight: 208 lb 14.4 oz (94.8 kg)  Height: 5\' 7"  (1.702 m)    Body mass index is 32.72 kg/m.  Nursing Note and Vital Signs reviewed.  Physical Exam  Skin:       Constitutional: Patient appears well-developed and well-nourished. Obese  No distress.  Cardiovascular: Normal rate, regular rhythm,  Pulmonary/Chest: Effort normal and breath  sounds clear. Psychiatric: Patient has a normal mood and affect. behavior is normal. Judgment and thought content normal.  No results found for this or any previous visit (from the past 72 hour(s)).  Assessment & Plan  1. Pruritic rash - Ambulatory referral to Dermatology - hydrocortisone cream 0.5 %; Apply 1 application topically 2 (two) times daily.  Dispense: 30 g; Refill: 0  2. Neoplasm of uncertain behavior - Ambulatory referral to Dermatology    -Red flags and when to present for emergency care or RTC including fever >101.44F, chest pain, shortness of breath, new/worsening/un-resolving symptoms,  if having fevers, chills, vomiting or worsening rash size with drainage or redness please seek medical attention reviewed with patient at time of visit. Follow up and care instructions discussed and provided in AVS.  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 I am certifying that I agree with the content of this note as supervising physician. Enid Derry, [facility] 08/31/2017, 5:26 PM

## 2017-08-31 NOTE — Patient Instructions (Signed)
-   if having fevers, chills, vomiting or worsening rash size with drainage or redness please seek medical attention - referring to dermatology  -cream used if needed for itching

## 2017-09-18 ENCOUNTER — Other Ambulatory Visit: Payer: Self-pay

## 2017-09-18 DIAGNOSIS — E785 Hyperlipidemia, unspecified: Secondary | ICD-10-CM

## 2017-09-18 NOTE — Telephone Encounter (Signed)
Refill Request for Cholesterol medication. Atorvastatin  Last visit: 08/31/17  Lab Results  Component Value Date   CHOL 167 01/04/2017   HDL 67 01/04/2017   LDLCALC 82 01/04/2017   TRIG 85 01/04/2017   CHOLHDL 2.5 01/04/2017   Follow up 11/13/17

## 2017-09-20 MED ORDER — ATORVASTATIN CALCIUM 20 MG PO TABS: 20.0000 mg | ORAL_TABLET | Freq: Every day | ORAL | 1 refills | Status: DC

## 2017-09-22 ENCOUNTER — Other Ambulatory Visit: Payer: Self-pay

## 2017-09-22 DIAGNOSIS — F72 Severe intellectual disabilities: Secondary | ICD-10-CM

## 2017-09-22 NOTE — Telephone Encounter (Signed)
Refill request for general medication. Paxil 30 mg  Last office visit: 08/31/17  Follow up on 11/13/17

## 2017-09-23 MED ORDER — PAROXETINE HCL 30 MG PO TABS: ORAL_TABLET | ORAL | 1 refills | Status: DC

## 2017-10-10 ENCOUNTER — Other Ambulatory Visit: Payer: Self-pay | Admitting: Family Medicine

## 2017-10-10 DIAGNOSIS — I10 Essential (primary) hypertension: Secondary | ICD-10-CM

## 2017-10-10 DIAGNOSIS — E785 Hyperlipidemia, unspecified: Secondary | ICD-10-CM

## 2017-10-10 DIAGNOSIS — F72 Severe intellectual disabilities: Secondary | ICD-10-CM

## 2017-10-11 NOTE — Telephone Encounter (Signed)
Refill request for Hypertension medication:  Maxzide 25   Last office visit pertaining to hypertension: 08/31/2017  BP Readings from Last 3 Encounters:  08/31/17 126/82  07/11/17 124/90  07/11/17 (!) 144/82    Lab Results  Component Value Date   CREATININE 0.90 07/11/2017   BUN 11 07/11/2017   NA 140 07/11/2017   K 3.8 07/11/2017   CL 104 07/11/2017   CO2 29 07/11/2017   Refill Request for Cholesterol medication. Atorvastatin 20 mg  Last physical: 07/05/2016  Lab Results  Component Value Date   CHOL 167 01/04/2017   HDL 67 01/04/2017   LDLCALC 82 01/04/2017   TRIG 85 01/04/2017   CHOLHDL 2.5 01/04/2017   Also needs: Gabapentin 300 mg Paxil 30 mg  Follow-ups on file. 11/13/2017

## 2017-10-13 ENCOUNTER — Other Ambulatory Visit: Payer: Self-pay | Admitting: Family Medicine

## 2017-10-16 ENCOUNTER — Other Ambulatory Visit: Payer: Self-pay | Admitting: Family Medicine

## 2017-10-16 DIAGNOSIS — M170 Bilateral primary osteoarthritis of knee: Secondary | ICD-10-CM

## 2017-11-06 ENCOUNTER — Other Ambulatory Visit: Payer: Self-pay | Admitting: Family Medicine

## 2017-11-06 NOTE — Telephone Encounter (Signed)
Refill request for thyroid medication. Levothyroxine   Last visit:  07/11/17   Lab Results  Component Value Date   TSH 1.91 07/11/2017     Follow up on 11/13/17   Patient switched Pharmacist to [facility].

## 2017-11-13 ENCOUNTER — Ambulatory Visit (INDEPENDENT_AMBULATORY_CARE_PROVIDER_SITE_OTHER): Payer: Medicare Other | Admitting: Family Medicine

## 2017-11-13 ENCOUNTER — Encounter: Payer: Self-pay | Admitting: Family Medicine

## 2017-11-13 VITALS — BP 118/78 | HR 90 | Temp 98.1°F | Resp 18 | Ht 67.0 in | Wt 211.4 lb

## 2017-11-13 DIAGNOSIS — Z1211 Encounter for screening for malignant neoplasm of colon: Secondary | ICD-10-CM | POA: Diagnosis not present

## 2017-11-13 DIAGNOSIS — I10 Essential (primary) hypertension: Secondary | ICD-10-CM

## 2017-11-13 DIAGNOSIS — Z1239 Encounter for other screening for malignant neoplasm of breast: Secondary | ICD-10-CM

## 2017-11-13 DIAGNOSIS — F72 Severe intellectual disabilities: Secondary | ICD-10-CM

## 2017-11-13 DIAGNOSIS — R7303 Prediabetes: Secondary | ICD-10-CM | POA: Diagnosis not present

## 2017-11-13 DIAGNOSIS — Z1231 Encounter for screening mammogram for malignant neoplasm of breast: Secondary | ICD-10-CM

## 2017-11-13 DIAGNOSIS — E559 Vitamin D deficiency, unspecified: Secondary | ICD-10-CM | POA: Diagnosis not present

## 2017-11-13 DIAGNOSIS — E039 Hypothyroidism, unspecified: Secondary | ICD-10-CM | POA: Diagnosis not present

## 2017-11-13 DIAGNOSIS — E785 Hyperlipidemia, unspecified: Secondary | ICD-10-CM

## 2017-11-13 NOTE — Progress Notes (Signed)
Name: Rebecca Bruce   MRN: [id number]    DOB: 17-Jun-1960   Date:11/13/2017       Progress Note  Subjective  Chief Complaint  Chief Complaint  Patient presents with  . Medication Refill  . Hypertension  . Anxiety  . Insomnia    Goes to bed around 8:30 p.m. and will only get up once to go to bed-her sleep has improved drastically  . Hypothyroidism    HPI   She came in with Rebecca Bruce from the group home today  HTN: taking medication as prescribed, no chest pain or palpitation, bp is at goal at home and also today in our office.   Hyperlipidemia: taking medication as prescribed and no problems, denies myalgias. She lives in a group home and likes to walk, also likes to sweep the floor. She has not been eating fried food anymore, but she loves fried chicken.   Hypothyroidism :used to seeDr. Rosario Bruce, s/p ablation for hyperthyroidism, doing well on Synthroid 100 mcg daily, last TSH was at goal. No constipation, but she has dry skin. She denies dysphagia. Doing well at this time.   Obesity:she lost 5 lbs before  last visit but she has gained 2 lbs back since.  She continues to go for daily walks. She has co-morbidities and discuss importance of portion control and to continue to lose some weight.   MR: stable, sees psychiatrist, Dr. Tamera Bruce  for her behavior, she lives in a group home, her brother sees her once a week and takes her to church. She needs assistance bathing and dressing, managing her money. Her brother Rebecca Bruce is the guarding.   Pre-diabetes: hgbA1C was 6.3%  In 2017, down to 6.0%  she is only eating a couple of times a week. She denies polyphagia, polydipsia or polyuria.Unchanged     Patient Active Problem List   Diagnosis Date Noted  . Pre-diabetes 01/05/2017  . Other symptoms and signs involving cognitive functions and awareness 09/01/2015  . Dyslipidemia 07/02/2015  . MR (mental retardation), severe 07/02/2015  . History of hyperthyroidism 07/02/2015  .  History of radioactive iodine thyroid ablation 07/02/2015  . Hypothyroidism (acquired) 07/02/2015  . Obesity 07/02/2015  . Hypertension, benign 07/02/2015  . Anxiety 07/02/2015  . Vitamin D deficiency 07/02/2015  . Insomnia 07/02/2015  . OA (osteoarthritis) of knee 07/02/2015  . GERD (gastroesophageal reflux disease) 07/02/2015  . Hyperlipidemia, unspecified 07/02/2015    Past Surgical History:  Procedure Laterality Date  . DENTAL SURGERY      Family History  Problem Relation Age of Onset  . Heart disease Mother   . Breast cancer Neg Hx     Social History   Socioeconomic History  . Marital status: Single    Spouse name: Not on file  . Number of children: 0  . Years of education: Not on file  . Highest education level: Not on file  Occupational History  . Occupation: Disabled  Social Needs  . Financial resource strain: Not hard at all  . Food insecurity:    Worry: Never true    Inability: Never true  . Transportation needs:    Medical: No    Non-medical: No  Tobacco Use  . Smoking status: Never Smoker  . Smokeless tobacco: Never Used  . Tobacco comment: smoking cessation materials not required  Substance and Sexual Activity  . Alcohol use: No    Alcohol/week: 0.0 oz  . Drug use: No  . Sexual activity: Never  Lifestyle  . Physical  activity:    Days per week: 7 days    Minutes per session: 90 min  . Stress: Not at all  Relationships  . Social connections:    Talks on phone: Once a week    Gets together: Once a week    Attends religious service: More than 4 times per year    Active member of club or organization: Yes    Attends meetings of clubs or organizations: More than 4 times per year    Relationship status: Never married  . Intimate partner violence:    Fear of current or ex partner: No    Emotionally abused: No    Physically abused: No    Forced sexual activity: No  Other Topics Concern  . Not on file  Social History Narrative   Stays At [place]   She goes to church with her brother every Sunday   Participate in group activities   Works part time at the [employer]      Current Outpatient Medications:  .  acetaminophen (MAPAP) 500 MG tablet, Take 1 tablet (500 mg total) every 8 (eight) hours as needed by mouth., Disp: 90 tablet, Rfl: 1 .  atorvastatin (LIPITOR) 20 MG tablet, TAKE 1 TABLET BY MOUTH AT BEDTIME FOR CHOLESTEROL, Disp: 30 tablet, Rfl: 5 .  clindamycin (CLEOCIN T) 1 % lotion, Apply topically daily., Disp: , Rfl:  .  gabapentin (NEURONTIN) 300 MG capsule, TAKE 1 CAPSULE BY MOUTH AT BEDTIME, Disp: 30 capsule, Rfl: 2 .  hydrocortisone cream 0.5 %, Apply 1 application topically 2 (two) times daily., Disp: 30 g, Rfl: 0 .  levothyroxine (SYNTHROID, LEVOTHROID) 100 MCG tablet, TAKE 1 TABLET BY MOUTH EVERY MORNING ON AN EMPTY STOMACH WITH A GLASSOF WATER, 1 HOURS BEFORE BREAKFAST, Disp: 30 tablet, Rfl: 5 .  LORazepam (ATIVAN) 0.5 MG tablet, Take 0.5 mg by mouth daily as needed for anxiety., Disp: , Rfl:  .  PARoxetine (PAXIL) 30 MG tablet, TAKE 1 TABLET BY MOUTH AT BEDTIME FOR DEPRESSION, Disp: 30 tablet, Rfl: 1 .  Probiotic Product (PROBIOTIC-10 ULTIMATE) CAPS, TAKE ONE CAPSULE BY MOUTH EVERY DAY, Disp: 30 capsule, Rfl: 11 .  triamcinolone cream (KENALOG) 0.1 %, Apply 1 application topically 2 (two) times daily. For two weeks and stop, if no improvement return for follow up, Disp: 45 g, Rfl: 0 .  triamterene-hydrochlorothiazide (MAXZIDE-25) 37.5-25 MG tablet, TAKE 1/2 TABLET BY MOUTH EVERY DAY, Disp: 15 tablet, Rfl: 11 .  VIACTIV CALCIUM PLUS D 650-12.5-40 MG-MCG CHEW, CHEW AND SWALLOW ONE PIECE TWICE DAILY. (SUPPLEMENT) CARAMEL, Disp: 60 tablet, Rfl: 12  No Known Allergies   ROS  Constitutional: Negative for fever or weight change.  Respiratory: Negative for cough and shortness of breath.   Cardiovascular: Negative for chest pain or palpitations.  Gastrointestinal: Negative for abdominal pain,  no bowel changes.  Musculoskeletal: Negative for gait problem or joint swelling.  Skin: Negative for rash.  Neurological: Negative for dizziness or headache.  No other specific complaints in a complete review of systems (except as listed in HPI above).  Objective  Vitals:   11/13/17 1121  BP: 118/78  Pulse: 90  Resp: 18  Temp: 98.1 F (36.7 C)  TempSrc: Oral  SpO2: 99%  Weight: 211 lb 6.4 oz (95.9 kg)  Height: 5\' 7"  (1.702 m)    Body mass index is 33.11 kg/m.  Physical Exam  Constitutional: Patient appears well-developed and well-nourished. Obese No distress.  HEENT: head atraumatic, normocephalic,  pupils equal and reactive to light,  neck supple, throat within normal limits Cardiovascular: Normal rate, regular rhythm and normal heart sounds.  No murmur heard. No BLE edema. Pulmonary/Chest: Effort normal and breath sounds normal. No respiratory distress. Abdominal: Soft.  There is no tenderness. Psychiatric: Patient has a normal mood and affect. behavior is normal. Judgment and thought content normal.  PHQ2/9: Depression screen [facility] 2/9 07/11/2017 03/13/2017 01/20/2016 10/20/2015 12/16/2014  Decreased Interest 0 0 0 0 0  Down, Depressed, Hopeless 0 0 0 0 0  PHQ - 2 Score 0 0 0 0 0  Altered sleeping 0 - - - -  Tired, decreased energy 0 - - - -  Change in appetite 0 - - - -  Feeling bad or failure about yourself  0 - - - -  Trouble concentrating 0 - - - -  Moving slowly or fidgety/restless 0 - - - -  Suicidal thoughts 0 - - - -  PHQ-9 Score 0 - - - -  Difficult doing work/chores Not difficult at all - - - -     Fall Risk: Fall Risk  11/13/2017 07/11/2017 03/13/2017 08/23/2016 01/20/2016  Falls in the past year? No No No No No  Risk for fall due to : - Other (Comment);Medication side effect - - -  Risk for fall due to: Comment - Mental retardation - - -     Functional Status Survey: Is the patient deaf or have difficulty hearing?: No Does the patient have difficulty  seeing, even when wearing glasses/contacts?: No Does the patient have difficulty concentrating, remembering, or making decisions?: Yes Does the patient have difficulty walking or climbing stairs?: Yes Does the patient have difficulty dressing or bathing?: Yes Does the patient have difficulty doing errands alone such as visiting a doctor's office or shopping?: Yes    Assessment & Plan  1. Hypothyroidism (acquired)  Last TSH at goal, continue current dose of levothyroxine   2. Severe intellectual disability  Taela Charbonneau is the guardian and we will contact him today and ask about preventive care ( mammogram/colonoscopy and pap smear.   3. Hypertension, benign  bp is at goal  4. Morbid obesity ([place])  BMI 30 with co-morbidities. Advised healthy diet   5. Dyslipidemia  Continue statin   6. Vitamin D deficiency  Continue supplementation   7. Pre-diabetes  Last hgbA1C is 6%, discussed healthy diet and exercise   8. Breast cancer screening  - MM DIGITAL SCREENING BILATERAL; Future  9. Colon cancer screening  - Cologuard  Spoke to legal guardian over the phone , we will order mammogram and cologuard, she refuses pelvic exam and he understands we will not be able to get it done.

## 2017-11-23 DIAGNOSIS — Z1211 Encounter for screening for malignant neoplasm of colon: Secondary | ICD-10-CM | POA: Diagnosis not present

## 2017-12-01 LAB — COLOGUARD: COLOGUARD: NEGATIVE

## 2017-12-04 ENCOUNTER — Encounter: Payer: Self-pay | Admitting: Family Medicine

## 2017-12-07 ENCOUNTER — Other Ambulatory Visit: Payer: Self-pay | Admitting: Family Medicine

## 2017-12-07 DIAGNOSIS — F72 Severe intellectual disabilities: Secondary | ICD-10-CM

## 2017-12-08 NOTE — Telephone Encounter (Signed)
Refill request for general medication. Paxil to [facility]  Last office visit 11/13/2017   Follow up on 03/16/2018

## 2017-12-13 ENCOUNTER — Other Ambulatory Visit: Payer: Self-pay | Admitting: Family Medicine

## 2017-12-22 ENCOUNTER — Other Ambulatory Visit: Payer: Self-pay | Admitting: Family Medicine

## 2017-12-22 NOTE — Telephone Encounter (Signed)
Refill request for general medication: Lorazepam 0.5 mg  Last office visit: 11/13/2017  Last physical exam: 07/11/2017  Follow-ups on file. 03/16/2018

## 2018-01-04 ENCOUNTER — Other Ambulatory Visit: Payer: Self-pay | Admitting: Family Medicine

## 2018-01-05 ENCOUNTER — Ambulatory Visit (INDEPENDENT_AMBULATORY_CARE_PROVIDER_SITE_OTHER): Payer: Medicare Other | Admitting: Family Medicine

## 2018-01-05 ENCOUNTER — Encounter: Payer: Self-pay | Admitting: Family Medicine

## 2018-01-05 VITALS — BP 122/86 | HR 110 | Temp 98.5°F | Resp 16 | Ht 67.0 in | Wt 211.8 lb

## 2018-01-05 DIAGNOSIS — Z23 Encounter for immunization: Secondary | ICD-10-CM

## 2018-01-05 DIAGNOSIS — F72 Severe intellectual disabilities: Secondary | ICD-10-CM

## 2018-01-05 DIAGNOSIS — J069 Acute upper respiratory infection, unspecified: Secondary | ICD-10-CM | POA: Diagnosis not present

## 2018-01-05 MED ORDER — SALINE SPRAY 0.65 % NA SOLN: 1.0000 | NASAL | 0 refills | Status: DC | PRN

## 2018-01-05 NOTE — Addendum Note (Signed)
Addended by: Inda Coke on: 01/05/2018 11:49 AM   Modules accepted: Orders

## 2018-01-05 NOTE — Progress Notes (Addendum)
Name: Rebecca Bruce   MRN: [id number]    DOB: 1960/07/09   Date:01/05/2018       Progress Note  Subjective  Chief Complaint  Chief Complaint  Patient presents with  . Form Completion    [employer] Form    HPI  URI: she lives in a group home, and another resident has a cold, past two days she developed clear rhinorrhea, nasal congestion, but does not seem to be in pain, no fever, chills or change in appetite. No diarrhea.   MR: she would like to run or walk on [employer] and brought forms to be filled out today  Patient Active Problem List   Diagnosis Date Noted  . Pre-diabetes 01/05/2017  . Other symptoms and signs involving cognitive functions and awareness 09/01/2015  . Dyslipidemia 07/02/2015  . MR (mental retardation), severe 07/02/2015  . History of hyperthyroidism 07/02/2015  . History of radioactive iodine thyroid ablation 07/02/2015  . Hypothyroidism (acquired) 07/02/2015  . Obesity 07/02/2015  . Hypertension, benign 07/02/2015  . Anxiety 07/02/2015  . Vitamin D deficiency 07/02/2015  . Insomnia 07/02/2015  . OA (osteoarthritis) of knee 07/02/2015  . GERD (gastroesophageal reflux disease) 07/02/2015  . Hyperlipidemia, unspecified 07/02/2015    Past Surgical History:  Procedure Laterality Date  . DENTAL SURGERY      Family History  Problem Relation Age of Onset  . Heart disease Mother   . Breast cancer Neg Hx     Social History   Socioeconomic History  . Marital status: Single    Spouse name: Not on file  . Number of children: 0  . Years of education: Not on file  . Highest education level: Not on file  Occupational History  . Occupation: Disabled  Social Needs  . Financial resource strain: Not hard at all  . Food insecurity:    Worry: Never true    Inability: Never true  . Transportation needs:    Medical: No    Non-medical: No  Tobacco Use  . Smoking status: Never Smoker  . Smokeless tobacco: Never Used  . Tobacco  comment: smoking cessation materials not required  Substance and Sexual Activity  . Alcohol use: No    Alcohol/week: 0.0 standard drinks  . Drug use: No  . Sexual activity: Never  Lifestyle  . Physical activity:    Days per week: 7 days    Minutes per session: 90 min  . Stress: Not at all  Relationships  . Social connections:    Talks on phone: Once a week    Gets together: Once a week    Attends religious service: More than 4 times per year    Active member of club or organization: Yes    Attends meetings of clubs or organizations: More than 4 times per year    Relationship status: Never married  . Intimate partner violence:    Fear of current or ex partner: No    Emotionally abused: No    Physically abused: No    Forced sexual activity: No  Other Topics Concern  . Not on file  Social History Narrative   Stays At [employer]   She goes to church with her brother every Sunday   Participate in group activities   Works part time at the [employer] program      Current Outpatient Medications:  .  acetaminophen (MAPAP) 500 MG tablet, Take 1 tablet (500 mg total) every 8 (eight) hours as needed  by mouth., Disp: 90 tablet, Rfl: 1 .  atorvastatin (LIPITOR) 20 MG tablet, TAKE 1 TABLET BY MOUTH AT BEDTIME FOR CHOLESTEROL, Disp: 30 tablet, Rfl: 5 .  clindamycin (CLEOCIN T) 1 % lotion, Apply topically daily., Disp: , Rfl:  .  gabapentin (NEURONTIN) 300 MG capsule, TAKE 1 CAPSULE BY MOUTH AT BEDTIME, Disp: 30 capsule, Rfl: 2 .  hydrocortisone cream 0.5 %, Apply 1 application topically 2 (two) times daily., Disp: 30 g, Rfl: 0 .  levothyroxine (SYNTHROID, LEVOTHROID) 100 MCG tablet, TAKE 1 TABLET BY MOUTH EVERY MORNING ON AN EMPTY STOMACH WITH A GLASSOF WATER, 1 HOURS BEFORE BREAKFAST, Disp: 30 tablet, Rfl: 5 .  LORazepam (ATIVAN) 0.5 MG tablet, Take 0.5 mg by mouth daily as needed for anxiety., Disp: , Rfl:  .  mupirocin ointment (BACTROBAN) 2 %, Apply topically., Disp: ,  Rfl:  .  PARoxetine (PAXIL) 30 MG tablet, TAKE 1 TABLET BY MOUTH AT BEDTIME FOR DEPRESSION, Disp: 30 tablet, Rfl: 2 .  Probiotic Product (PROBIOTIC-10 ULTIMATE) CAPS, TAKE 1 CAPSULE BY MOUTH EVERY DAY., Disp: 30 capsule, Rfl: 11 .  triamcinolone cream (KENALOG) 0.1 %, Apply 1 application topically 2 (two) times daily. For two weeks and stop, if no improvement return for follow up, Disp: 45 g, Rfl: 0 .  triamterene-hydrochlorothiazide (MAXZIDE-25) 37.5-25 MG tablet, TAKE 1/2 TABLET BY MOUTH EVERY DAY, Disp: 15 tablet, Rfl: 11 .  VIACTIV CALCIUM PLUS D 650-12.5-40 MG-MCG CHEW, CHEW AND SWALLOW ONE PIECE TWICE DAILY. (SUPPLEMENT) CARAMEL, Disp: 60 tablet, Rfl: 12  No Known Allergies  I personally reviewed active problem list, medication list, allergies, family history, social history with the patient/caregiver today.   ROS  Ten systems reviewed and is negative except as mentioned in HPI   Objective  Vitals:   01/05/18 1112  BP: 122/86  Pulse: (!) 110  Resp: 16  Temp: 98.5 F (36.9 C)  TempSrc: Oral  SpO2: 99%  Weight: 211 lb 12.8 oz (96.1 kg)  Height: 5\' 7"  (1.702 m)    Body mass index is 33.17 kg/m.  Physical Exam  Constitutional: Patient appears well-developed and well-nourished. Obese  No distress.  HEENT: head atraumatic, normocephalic, pupils equal and reactive to light, ears normal TM, neck supple, throat within normal limits Cardiovascular: Normal rate, regular rhythm and normal heart sounds.  No murmur heard. No BLE edema. Pulmonary/Chest: Effort normal and breath sounds normal. No respiratory distress. Abdominal: Soft.  There is no tenderness. Normal bowel sounds Neurological: decrease reflexes but likely because she could not relax Muscular skeletal: decrease extension of neck, decrease of abduction and internal rotation of shoulders, normal hip rom  Psychiatric: Patient has a normal mood and affect.  Cooperative and seems happy   Recent Results (from the past 2160  hour(s))  Cologuard     Status: None   Collection Time: 11/23/17 12:00 AM  Result Value Ref Range   Cologuard Negative      PHQ2/9: Depression screen [facility] 2/9 07/11/2017 03/13/2017 01/20/2016 10/20/2015 12/16/2014  Decreased Interest 0 0 0 0 0  Down, Depressed, Hopeless 0 0 0 0 0  PHQ - 2 Score 0 0 0 0 0  Altered sleeping 0 - - - -  Tired, decreased energy 0 - - - -  Change in appetite 0 - - - -  Feeling bad or failure about yourself  0 - - - -  Trouble concentrating 0 - - - -  Moving slowly or fidgety/restless 0 - - - -  Suicidal thoughts  0 - - - -  PHQ-9 Score 0 - - - -  Difficult doing work/chores Not difficult at all - - - -     Fall Risk: Fall Risk  11/13/2017 07/11/2017 03/13/2017 08/23/2016 01/20/2016  Falls in the past year? No No No No No  Risk for fall due to : - Other (Comment);Medication side effect - - -  Risk for fall due to: Comment - Mental retardation - - -     Assessment & Plan  1. Acute URI  - sodium chloride (OCEAN) 0.65 % SOLN nasal spray; Place 1 spray into both nostrils as needed for congestion. Dc after one week  Dispense: 15 mL; Refill: 0  2. Severe intellectual disability  [employer] form and exam done today and scanned to chart   3. Need for immunization against influenza  - Flu Vaccine QUAD 6+ mos PF IM (Fluarix Quad PF)

## 2018-01-08 DIAGNOSIS — B351 Tinea unguium: Secondary | ICD-10-CM | POA: Diagnosis not present

## 2018-01-08 DIAGNOSIS — M79674 Pain in right toe(s): Secondary | ICD-10-CM | POA: Diagnosis not present

## 2018-01-08 DIAGNOSIS — M79675 Pain in left toe(s): Secondary | ICD-10-CM | POA: Diagnosis not present

## 2018-01-08 DIAGNOSIS — L600 Ingrowing nail: Secondary | ICD-10-CM | POA: Diagnosis not present

## 2018-01-10 DIAGNOSIS — L281 Prurigo nodularis: Secondary | ICD-10-CM | POA: Diagnosis not present

## 2018-02-02 ENCOUNTER — Other Ambulatory Visit: Payer: Self-pay | Admitting: Family Medicine

## 2018-03-02 ENCOUNTER — Other Ambulatory Visit: Payer: Self-pay | Admitting: Family Medicine

## 2018-03-02 DIAGNOSIS — F72 Severe intellectual disabilities: Secondary | ICD-10-CM

## 2018-03-06 ENCOUNTER — Other Ambulatory Visit: Payer: Self-pay | Admitting: Family Medicine

## 2018-03-07 DIAGNOSIS — B351 Tinea unguium: Secondary | ICD-10-CM | POA: Diagnosis not present

## 2018-03-07 DIAGNOSIS — L600 Ingrowing nail: Secondary | ICD-10-CM | POA: Diagnosis not present

## 2018-03-13 ENCOUNTER — Ambulatory Visit: Payer: Self-pay | Admitting: Podiatry

## 2018-03-16 ENCOUNTER — Encounter: Payer: Self-pay | Admitting: Nurse Practitioner

## 2018-03-16 ENCOUNTER — Ambulatory Visit: Payer: Self-pay | Admitting: Family Medicine

## 2018-03-16 ENCOUNTER — Ambulatory Visit (INDEPENDENT_AMBULATORY_CARE_PROVIDER_SITE_OTHER): Payer: Medicare Other | Admitting: Nurse Practitioner

## 2018-03-16 VITALS — BP 124/80 | HR 95 | Temp 97.8°F | Resp 16 | Ht 67.0 in | Wt 205.1 lb

## 2018-03-16 DIAGNOSIS — M79675 Pain in left toe(s): Secondary | ICD-10-CM

## 2018-03-16 DIAGNOSIS — Z6830 Body mass index (BMI) 30.0-30.9, adult: Secondary | ICD-10-CM | POA: Diagnosis not present

## 2018-03-16 DIAGNOSIS — I10 Essential (primary) hypertension: Secondary | ICD-10-CM

## 2018-03-16 DIAGNOSIS — E785 Hyperlipidemia, unspecified: Secondary | ICD-10-CM

## 2018-03-16 DIAGNOSIS — E6609 Other obesity due to excess calories: Secondary | ICD-10-CM | POA: Diagnosis not present

## 2018-03-16 DIAGNOSIS — F72 Severe intellectual disabilities: Secondary | ICD-10-CM

## 2018-03-16 DIAGNOSIS — E039 Hypothyroidism, unspecified: Secondary | ICD-10-CM | POA: Diagnosis not present

## 2018-03-16 DIAGNOSIS — R7303 Prediabetes: Secondary | ICD-10-CM | POA: Diagnosis not present

## 2018-03-16 NOTE — Patient Instructions (Addendum)
-   Please contact her podiatrist to help manage toe nails- especially the pinky nail on left foot that is causing her pain.  Yvetta Coder, DPM (Attending) DM: [phone] (Work) [phone] (Fax) [place], [place] [address] Podiatry  ___________ - Please continue taking medications; follow up in 4 months and come in sooner if any changes or if needed.

## 2018-03-16 NOTE — Progress Notes (Addendum)
Name: Rebecca Bruce   MRN: [id number]    DOB: 02/22/1961   Date:03/16/2018       Progress Note  Subjective  Chief Complaint  Chief Complaint  Patient presents with  . Hypothyroidism  . Hypertension  . Hyperlipidemia  . Prediabetes  . Foot Problem    Left foot needs to be checked    HPI  She came in with Rebecca Bruce  from the group home today  Left foot: pinky toe pain, worse with walking when nail hits shoe- at the nail- it is thick and ingrown. No injury, swelling, redness.   HTN: taking maxide 37.5-25mg  half tablet daily as prescribed, no chest pain or palpitation. Well controlled today.   BP Readings from Last 3 Encounters:  03/16/18 124/80  01/05/18 122/86  11/13/17 118/78    Hyperlipidemia: taking lipitor 20mg  as prescribed and no problems, denies myalgias. She lives in a group home andshe helps sweep the floor. Has been eating healthy foods but goes out sometimes.   Lab Results  Component Value Date   CHOL 167 01/04/2017   HDL 67 01/04/2017   LDLCALC 82 01/04/2017   TRIG 85 01/04/2017   CHOLHDL 2.5 01/04/2017    Hypothyroidism :used to seeDr. Rosario Bruce, s/p ablation for hyperthyroidism, doing well on Synthroid 100 mcg daily, last TSH was at goal. No constipation, diarrhea, palpitations,dry skin.She denies dysphagia.Doing well at this time.   Lab Results  Component Value Date   TSH 1.91 07/11/2017    Obesity:she lost 5 lbs before  last visit but she has gained 2 lbs back since.  She continues to go for daily walks. She has co-morbidities and discuss importance of portion control and to continue to lose some weight. Wt Readings from Last 3 Encounters:  03/16/18 205 lb 1.6 oz (93 kg)  01/05/18 211 lb 12.8 oz (96.1 kg)  11/13/17 211 lb 6.4 oz (95.9 kg)    MR: stable, sees psychiatrist, Dr. Tamera Bruce  for her behavior, she lives in a group home, her brother sees her once a week and takes her to church, does activities with the group home; going  to parade this weekend. She needs assistance bathing and dressing, managing her money.Her brother Rebecca Bruce is the guarding.   Pre-diabetes: hgbA1C was 6.3%In 2017, down to 6.0%  sheis only eating a couple of times a week. Shedenies polyphagia, polydipsia or polyuria. Lab Results  Component Value Date   HGBA1C 6.0 (H) 07/11/2017     Patient Active Problem List   Diagnosis Date Noted  . Pre-diabetes 01/05/2017  . Other symptoms and signs involving cognitive functions and awareness 09/01/2015  . Dyslipidemia 07/02/2015  . MR (mental retardation), severe 07/02/2015  . History of hyperthyroidism 07/02/2015  . History of radioactive iodine thyroid ablation 07/02/2015  . Hypothyroidism (acquired) 07/02/2015  . Obesity 07/02/2015  . Hypertension, benign 07/02/2015  . Anxiety 07/02/2015  . Vitamin D deficiency 07/02/2015  . Insomnia 07/02/2015  . OA (osteoarthritis) of knee 07/02/2015  . GERD (gastroesophageal reflux disease) 07/02/2015  . Hyperlipidemia, unspecified 07/02/2015    Past Medical History:  Diagnosis Date  . GERD (gastroesophageal reflux disease)   . Hypertension   . Thyroid disease     Past Surgical History:  Procedure Laterality Date  . DENTAL SURGERY      Social History   Tobacco Use  . Smoking status: Never Smoker  . Smokeless tobacco: Never Used  . Tobacco comment: smoking cessation materials not required  Substance Use Topics  . Alcohol  use: No    Alcohol/week: 0.0 standard drinks     Current Outpatient Medications:  .  atorvastatin (LIPITOR) 20 MG tablet, TAKE 1 TABLET BY MOUTH AT BEDTIME FOR CHOLESTEROL, Disp: 30 tablet, Rfl: 5 .  clindamycin (CLEOCIN T) 1 % lotion, Apply topically daily., Disp: , Rfl:  .  gabapentin (NEURONTIN) 300 MG capsule, TAKE 1 CAPSULE BY MOUTH AT BEDTIME, Disp: 30 capsule, Rfl: 2 .  hydrocortisone cream 0.5 %, Apply 1 application topically 2 (two) times daily., Disp: 30 g, Rfl: 0 .  levothyroxine (SYNTHROID, LEVOTHROID)  100 MCG tablet, TAKE 1 TABLET BY MOUTH EVERY MORNING ON AN EMPTY STOMACH WITH A GLASSOF WATER, 1 HOURS BEFORE BREAKFAST, Disp: 30 tablet, Rfl: 5 .  LORazepam (ATIVAN) 0.5 MG tablet, Take 0.5 mg by mouth daily as needed for anxiety., Disp: , Rfl:  .  mupirocin ointment (BACTROBAN) 2 %, Apply topically., Disp: , Rfl:  .  PARoxetine (PAXIL) 30 MG tablet, TAKE 1 TABLET BY MOUTH AT BEDTIME FOR DEPRESSION, Disp: 30 tablet, Rfl: 1 .  Probiotic Product (PROBIOTIC-10 ULTIMATE) CAPS, TAKE 1 CAPSULE BY MOUTH EVERY DAY., Disp: 30 capsule, Rfl: 11 .  sodium chloride (OCEAN) 0.65 % SOLN nasal spray, Place 1 spray into both nostrils as needed for congestion. Dc after one week, Disp: 15 mL, Rfl: 0 .  triamcinolone cream (KENALOG) 0.1 %, Apply 1 application topically 2 (two) times daily. For two weeks and stop, if no improvement return for follow up, Disp: 45 g, Rfl: 0 .  triamterene-hydrochlorothiazide (MAXZIDE-25) 37.5-25 MG tablet, TAKE 1/2 TABLET BY MOUTH EVERY DAY, Disp: 15 tablet, Rfl: 11 .  VIACTIV CALCIUM PLUS D 650-12.5-40 MG-MCG CHEW, CHEW AND SWALLOW ONE PIECE TWICE DAILY. (SUPPLEMENT) CARAMEL, Disp: 60 tablet, Rfl: 12  No Known Allergies  ROS   No other specific complaints in a complete review of systems (except as listed in HPI above).  Objective  Vitals:   03/16/18 1031  BP: 124/80  Pulse: 95  Resp: 16  Temp: 97.8 F (36.6 C)  TempSrc: Oral  SpO2: 99%  Weight: 205 lb 1.6 oz (93 kg)  Height: 5\' 7"  (1.702 m)    Body mass index is 32.12 kg/m.  Nursing Note and Vital Signs reviewed.  Physical Exam  Constitutional: She is oriented to person, place, and time. She appears well-developed and well-nourished.  HENT:  Head: Normocephalic and atraumatic.  Nose: Nose normal.  Eyes: Conjunctivae are normal.  Neck: Normal range of motion. Neck supple. Carotid bruit is not present.  Cardiovascular: Normal heart sounds and intact distal pulses.  Pulmonary/Chest: Effort normal and breath  sounds normal.  Abdominal: Soft. Bowel sounds are normal. There is no tenderness. There is no CVA tenderness.  Obese abdomen   Musculoskeletal: Normal range of motion.       Feet:  Neurological: She is alert and oriented to person, place, and time. She has normal strength. No sensory deficit. GCS eye subscore is 4. GCS verbal subscore is 5. GCS motor subscore is 6.  Skin: Skin is warm, dry and intact. Capillary refill takes less than 2 seconds.  Psychiatric: She has a normal mood and affect. Her speech is normal and behavior is normal.  Vitals reviewed.     No results found for this or any previous visit (from the past 48 hour(s)).  Assessment & Plan  1. Pre-diabetes Stable, has lost weight and been eating healthier, continue diet and acticity   2. Dyslipidemia Stable continue meds  3. Severe intellectual disability  Stable continue management with dr. Tamera Bruce  4. Hypothyroidism (acquired) Stable continue medication management   5. Class 1 obesity due to excess calories without serious comorbidity with body mass index (BMI) of 30.0 to 30.9 in adult Improvement- continue healthy eating and increasing activity   6. Hypertension, benign Well controlled today; continue medications   7. Pain of toe of left foot Follow up with podiatry for nail trimming

## 2018-04-02 ENCOUNTER — Other Ambulatory Visit: Payer: Self-pay | Admitting: Family Medicine

## 2018-04-02 NOTE — Telephone Encounter (Signed)
Refill request for general medication. Levothyroxine and Gabapentin to [facility].   Last office visit  11/13/2017   Follow up on 07/13/2018

## 2018-04-25 DIAGNOSIS — L281 Prurigo nodularis: Secondary | ICD-10-CM | POA: Diagnosis not present

## 2018-05-03 DIAGNOSIS — M79675 Pain in left toe(s): Secondary | ICD-10-CM | POA: Diagnosis not present

## 2018-05-03 DIAGNOSIS — B351 Tinea unguium: Secondary | ICD-10-CM | POA: Diagnosis not present

## 2018-05-05 ENCOUNTER — Other Ambulatory Visit: Payer: Self-pay | Admitting: Family Medicine

## 2018-05-05 DIAGNOSIS — E785 Hyperlipidemia, unspecified: Secondary | ICD-10-CM

## 2018-06-07 ENCOUNTER — Other Ambulatory Visit: Payer: Self-pay | Admitting: Family Medicine

## 2018-06-07 DIAGNOSIS — F72 Severe intellectual disabilities: Secondary | ICD-10-CM

## 2018-07-13 ENCOUNTER — Other Ambulatory Visit: Payer: Self-pay

## 2018-07-13 ENCOUNTER — Ambulatory Visit (INDEPENDENT_AMBULATORY_CARE_PROVIDER_SITE_OTHER): Payer: Medicare Other | Admitting: Family Medicine

## 2018-07-13 ENCOUNTER — Encounter: Payer: Self-pay | Admitting: Family Medicine

## 2018-07-13 VITALS — BP 126/74 | HR 90 | Temp 97.9°F | Resp 16 | Ht 67.0 in | Wt 199.1 lb

## 2018-07-13 DIAGNOSIS — F70 Mild intellectual disabilities: Secondary | ICD-10-CM | POA: Diagnosis not present

## 2018-07-13 DIAGNOSIS — I10 Essential (primary) hypertension: Secondary | ICD-10-CM | POA: Diagnosis not present

## 2018-07-13 DIAGNOSIS — E785 Hyperlipidemia, unspecified: Secondary | ICD-10-CM | POA: Diagnosis not present

## 2018-07-13 DIAGNOSIS — R7303 Prediabetes: Secondary | ICD-10-CM

## 2018-07-13 DIAGNOSIS — R739 Hyperglycemia, unspecified: Secondary | ICD-10-CM

## 2018-07-13 DIAGNOSIS — E039 Hypothyroidism, unspecified: Secondary | ICD-10-CM

## 2018-07-13 NOTE — Progress Notes (Signed)
Name: Rebecca Bruce   MRN: [id number]    DOB: [birth year]   Date:07/13/2018       Progress Note  Subjective  Chief Complaint  Chief Complaint  Patient presents with  . FL-2 Form    HPI  HTN: taking medication as prescribed, no chest pain or palpitation, bp is at goal, no side effects   Hyperlipidemia: taking medication as prescribed and no problems, denies myalgias. She lives in a group home andlikes to walk, also likes to sweep the floor. She is avoiding fried food   Hypothyroidism :used to seeDr. Rosario Jacks, s/p ablation for hyperthyroidism, doing well on Synthroid 100 mcg daily, last TSH was at goal. No constipation,but she hasdry skin.She denies dysphagia.Unchanged   Obesity:she lost a little weight.   She continues to go for daily walks.She has been avoiding desserts.   MR: stable, sees psychiatrist, Dr. Tamera Punt  for her behavior, she lives in a group home, her brother sees her once a week and takes her to church, however because of COVID-19 she cannot see him right now, she states she is okay. She needs assistance bathing and dressing, managing her money.Her brother [name] is the guarding.   Pre-diabetes: hgbA1C was 6.3%In 2017, down to 6.0% last year ,  sheis only eating a couple of times a week. Shedenies polyphagia, polydipsia or polyuria.She has lost since last visit   Decrease shoulder range of motion: difficulty with abduction, advised home PT evaluation   Patient Active Problem List   Diagnosis Date Noted  . Pre-diabetes 01/05/2017  . Other symptoms and signs involving cognitive functions and awareness 09/01/2015  . Dyslipidemia 07/02/2015  . Severe intellectual disability 07/02/2015  . History of hyperthyroidism 07/02/2015  . History of radioactive iodine thyroid ablation 07/02/2015  . Hypothyroidism (acquired) 07/02/2015  . Obesity 07/02/2015  . Hypertension, benign 07/02/2015  . Anxiety 07/02/2015  . Vitamin D deficiency 07/02/2015  .  Insomnia 07/02/2015  . OA (osteoarthritis) of knee 07/02/2015  . GERD (gastroesophageal reflux disease) 07/02/2015  . Hyperlipidemia, unspecified 07/02/2015    Past Surgical History:  Procedure Laterality Date  . DENTAL SURGERY      Family History  Problem Relation Age of Onset  . Heart disease Mother   . Breast cancer Neg Hx     Social History   Socioeconomic History  . Marital status: Single    Spouse name: Not on file  . Number of children: 0  . Years of education: Not on file  . Highest education level: Not on file  Occupational History  . Occupation: Disabled  Social Needs  . Financial resource strain: Not hard at all  . Food insecurity:    Worry: Never true    Inability: Never true  . Transportation needs:    Medical: No    Non-medical: No  Tobacco Use  . Smoking status: Never Smoker  . Smokeless tobacco: Never Used  . Tobacco comment: smoking cessation materials not required  Substance and Sexual Activity  . Alcohol use: No    Alcohol/week: 0.0 standard drinks  . Drug use: No  . Sexual activity: Never  Lifestyle  . Physical activity:    Days per week: 7 days    Minutes per session: 90 min  . Stress: Not at all  Relationships  . Social connections:    Talks on phone: Once a week    Gets together: Once a week    Attends religious service: More than 4 times per year  Active member of club or organization: Yes    Attends meetings of clubs or organizations: More than 4 times per year    Relationship status: Never married  . Intimate partner violence:    Fear of current or ex partner: No    Emotionally abused: No    Physically abused: No    Forced sexual activity: No  Other Topics Concern  . Not on file  Social History Narrative   Stays At [employer], [place]   She goes to church with her brother every Sunday   Participate in group activities   Works part time at the [employer] program      Current Outpatient Medications:  .   atorvastatin (LIPITOR) 20 MG tablet, TAKE 1 TABLET BY MOUTH AT BEDTIME FOR CHOLESTEROL, Disp: 30 tablet, Rfl: 5 .  clindamycin (CLEOCIN T) 1 % lotion, Apply topically daily., Disp: , Rfl:  .  gabapentin (NEURONTIN) 300 MG capsule, TAKE 1 CAPSULE BY MOUTH AT BEDTIME, Disp: 30 capsule, Rfl: 2 .  hydrocortisone cream 0.5 %, Apply 1 application topically 2 (two) times daily., Disp: 30 g, Rfl: 0 .  levothyroxine (SYNTHROID, LEVOTHROID) 100 MCG tablet, TAKE 1 TABLET BY MOUTH EVERY MORNING ON AN EMPTY STOMACH WITH A GLASSOF WATER, 1 HOURS BEFORE BREAKFAST, Disp: 30 tablet, Rfl: 2 .  LORazepam (ATIVAN) 0.5 MG tablet, Take 0.5 mg by mouth daily as needed for anxiety., Disp: , Rfl:  .  mupirocin ointment (BACTROBAN) 2 %, Apply topically., Disp: , Rfl:  .  PARoxetine (PAXIL) 30 MG tablet, TAKE 1 TABLET BY MOUTH AT BEDTIME FOR DEPRESSION, Disp: 30 tablet, Rfl: 1 .  Probiotic Product (PROBIOTIC-10 ULTIMATE) CAPS, TAKE 1 CAPSULE BY MOUTH EVERY DAY., Disp: 30 capsule, Rfl: 11 .  sodium chloride (OCEAN) 0.65 % SOLN nasal spray, Place 1 spray into both nostrils as needed for congestion. Dc after one week, Disp: 15 mL, Rfl: 0 .  triamcinolone cream (KENALOG) 0.1 %, Apply 1 application topically 2 (two) times daily. For two weeks and stop, if no improvement return for follow up, Disp: 45 g, Rfl: 0 .  triamterene-hydrochlorothiazide (MAXZIDE-25) 37.5-25 MG tablet, TAKE 1/2 TABLET BY MOUTH EVERY DAY, Disp: 15 tablet, Rfl: 11 .  VIACTIV CALCIUM PLUS D 650-12.5-40 MG-MCG CHEW, CHEW AND SWALLOW ONE PIECE TWICE DAILY. (SUPPLEMENT) CARAMEL, Disp: 60 tablet, Rfl: 12  No Known Allergies  I personally reviewed active problem list, medication list, allergies, family history, social history with the patient/caregiver today.   ROS  Constitutional: Negative for fever or weight change.  Respiratory: Negative for cough and shortness of breath.   Cardiovascular: Negative for chest pain or palpitations.  Gastrointestinal:  Negative for abdominal pain, no bowel changes.  Musculoskeletal: positive  for gait problem - cautious , but no joint swelling.  Skin: Negative for rash.  Neurological: Negative for dizziness or headache.  No other specific complaints in a complete review of systems (except as listed in HPI above).  Objective  Vitals:   07/13/18 1004  BP: 126/74  Pulse: 90  Resp: 16  Temp: 97.9 F (36.6 C)  TempSrc: Oral  SpO2: 99%  Weight: 199 lb 1.6 oz (90.3 kg)  Height: 5\' 7"  (1.702 m)    Body mass index is 31.18 kg/m.  Physical Exam   Constitutional: Patient appears well-developed and well-nourished. Obese  No distress.  HEENT: head atraumatic, normocephalic, pupils equal and reactive to light, neck supple, throat within normal limits Cardiovascular: Normal rate, regular rhythm and normal heart  sounds.  No murmur heard. No BLE edema. Pulmonary/Chest: Effort normal and breath sounds normal. No respiratory distress. Muscular Skeletal: she has decrease in internal rotation and abduction of both arms, does not seem to be in pain, also difficulty going up and down stairs  Abdominal: Soft.  There is no tenderness. Psychiatric: Patient has a normal mood and affect. behavior is normal. Cooperative and very sweet   PHQ2/9: Depression screen [facility] 2/9 07/11/2017 03/13/2017 01/20/2016 10/20/2015 12/16/2014  Decreased Interest 0 0 0 0 0  Down, Depressed, Hopeless 0 0 0 0 0  PHQ - 2 Score 0 0 0 0 0  Altered sleeping 0 - - - -  Tired, decreased energy 0 - - - -  Change in appetite 0 - - - -  Feeling bad or failure about yourself  0 - - - -  Trouble concentrating 0 - - - -  Moving slowly or fidgety/restless 0 - - - -  Suicidal thoughts 0 - - - -  PHQ-9 Score 0 - - - -  Difficult doing work/chores Not difficult at all - - - -    phq 9 is negative Difficulty to say secondary to MR  Fall Risk: Fall Risk  07/13/2018 03/16/2018 11/13/2017 07/11/2017 03/13/2017  Falls in the past year? 0 0 No No No  Number  falls in past yr: 0 - - - -  Injury with Fall? 0 - - - -  Risk for fall due to : - - - Other (Comment);Medication side effect -  Risk for fall due to: Comment - - - Mental retardation -     Functional Status Survey: Is the patient deaf or have difficulty hearing?: No Does the patient have difficulty seeing, even when wearing glasses/contacts?: No Does the patient have difficulty concentrating, remembering, or making decisions?: Yes Does the patient have difficulty walking or climbing stairs?: No Does the patient have difficulty dressing or bathing?: No Does the patient have difficulty doing errands alone such as visiting a doctor's office or shopping?: Yes    Assessment & Plan  1. Hypertension, benign  - CBC with Differential/Platelet - COMPLETE METABOLIC PANEL WITH GFR  2. Dyslipidemia  - Lipid panel  3. Pre-diabetes  - Hemoglobin A1c  4. Hypothyroidism (acquired)  - TSH  5. Mild  intellectual disability   6. Hyperglycemia  - Hemoglobin A1c

## 2018-07-14 LAB — CBC WITH DIFFERENTIAL/PLATELET
Absolute Monocytes: 820 cells/uL (ref 200–950)
Basophils Absolute: 28 cells/uL (ref 0–200)
Basophils Relative: 0.5 %
Eosinophils Absolute: 110 cells/uL (ref 15–500)
Eosinophils Relative: 2 %
HCT: 42.5 % (ref 35.0–45.0)
Hemoglobin: 13.8 g/dL (ref 11.7–15.5)
Lymphs Abs: 2112 cells/uL (ref 850–3900)
MCH: 26.7 pg — ABNORMAL LOW (ref 27.0–33.0)
MCHC: 32.5 g/dL (ref 32.0–36.0)
MCV: 82.2 fL (ref 80.0–100.0)
MPV: 10.3 fL (ref 7.5–12.5)
Monocytes Relative: 14.9 %
Neutro Abs: 2431 cells/uL (ref 1500–7800)
Neutrophils Relative %: 44.2 %
Platelets: 305 10*3/uL (ref 140–400)
RBC: 5.17 10*6/uL — ABNORMAL HIGH (ref 3.80–5.10)
RDW: 13.1 % (ref 11.0–15.0)
Total Lymphocyte: 38.4 %
WBC: 5.5 10*3/uL (ref 3.8–10.8)

## 2018-07-14 LAB — COMPLETE METABOLIC PANEL WITH GFR
AG Ratio: 1.5 (calc) (ref 1.0–2.5)
ALT: 21 U/L (ref 6–29)
AST: 17 U/L (ref 10–35)
Albumin: 4.2 g/dL (ref 3.6–5.1)
Alkaline phosphatase (APISO): 87 U/L (ref 37–153)
BUN: 14 mg/dL (ref 7–25)
CO2: 31 mmol/L (ref 20–32)
Calcium: 9.3 mg/dL (ref 8.6–10.4)
Chloride: 104 mmol/L (ref 98–110)
Creat: 1 mg/dL (ref 0.50–1.05)
GFR, Est African American: 73 mL/min/{1.73_m2} (ref >=60)
GFR, Est Non African American: 63 mL/min/{1.73_m2} (ref >=60)
Globulin: 2.8 g/dL (calc) (ref 1.9–3.7)
Glucose, Bld: 101 mg/dL — ABNORMAL HIGH (ref 65–99)
Potassium: 3.6 mmol/L (ref 3.5–5.3)
Sodium: 142 mmol/L (ref 135–146)
Total Bilirubin: 0.6 mg/dL (ref 0.2–1.2)
Total Protein: 7 g/dL (ref 6.1–8.1)

## 2018-07-14 LAB — HEMOGLOBIN A1C
Hgb A1c MFr Bld: 6 % of total Hgb — ABNORMAL HIGH (ref <5.7)
Mean Plasma Glucose: 126 (calc)
eAG (mmol/L): 7 (calc)

## 2018-07-14 LAB — LIPID PANEL
Cholesterol: 159 mg/dL (ref <200)
HDL: 56 mg/dL (ref >=50)
LDL Cholesterol (Calc): 88 mg/dL (calc)
Non-HDL Cholesterol (Calc): 103 mg/dL (calc) (ref <130)
Total CHOL/HDL Ratio: 2.8 (calc) (ref <5.0)
Triglycerides: 63 mg/dL (ref <150)

## 2018-07-14 LAB — TSH: TSH: 2.22 mIU/L (ref 0.40–4.50)

## 2018-08-02 ENCOUNTER — Other Ambulatory Visit: Payer: Self-pay | Admitting: Family Medicine

## 2018-08-02 DIAGNOSIS — F72 Severe intellectual disabilities: Secondary | ICD-10-CM

## 2018-08-03 ENCOUNTER — Encounter: Payer: Self-pay | Admitting: Family Medicine

## 2018-08-03 ENCOUNTER — Ambulatory Visit (INDEPENDENT_AMBULATORY_CARE_PROVIDER_SITE_OTHER): Payer: Medicare Other | Admitting: Family Medicine

## 2018-08-03 ENCOUNTER — Other Ambulatory Visit: Payer: Self-pay

## 2018-08-03 DIAGNOSIS — N3000 Acute cystitis without hematuria: Secondary | ICD-10-CM | POA: Diagnosis not present

## 2018-08-03 MED ORDER — SULFAMETHOXAZOLE-TRIMETHOPRIM 800-160 MG PO TABS: 1.0000 | ORAL_TABLET | Freq: Two times a day (BID) | ORAL | 0 refills | Status: AC

## 2018-08-03 NOTE — Progress Notes (Signed)
Name: Rebecca Bruce   MRN: [id number]    DOB: 12-23-1960   Date:08/03/2018       Progress Note  Subjective  Chief Complaint  Chief Complaint  Patient presents with  . Urinary Tract Infection    burning    I connected with  Rebecca Bruce on 08/03/18 at 10:20 AM EDT by telephone and verified that I am speaking with the correct person using two identifiers.  I discussed the limitations, risks, security and privacy concerns of performing an evaluation and management service by telephone and the availability of in person appointments. Staff also discussed with the patient that there may be a patient responsible charge related to this service. Patient Location: Day Program with Rebecca Bruce Provider Location: Home Additional Individuals present: Rebecca Bruce with Rebecca Bruce who provides most of the history.  HPI  Pt has been complaining of burning with urination yesterday and today.  She denies NVD or constipation, hematuria, urinary frequency, abdominal/flank/back pain, fevers or chills, no changes in mental status or behavior.  Toileting independently without any accidents, diet is normal.  Patient Active Problem List   Diagnosis Date Noted  . 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 Hyperglycemia 07/13/2018  . Pre-diabetes 01/05/2017  . Other symptoms and signs involving cognitive functions and awareness 09/01/2015  . Dyslipidemia 07/02/2015  . History of hyperthyroidism 07/02/2015  . History of radioactive iodine thyroid ablation 07/02/2015  . Hypothyroidism (acquired) 07/02/2015  . Obesity 07/02/2015  . Hypertension, benign 07/02/2015  . Anxiety 07/02/2015  . Vitamin D deficiency 07/02/2015  . Insomnia 07/02/2015  . OA (osteoarthritis) of knee 07/02/2015  . GERD (gastroesophageal reflux disease) 07/02/2015  . Hyperlipidemia, unspecified 07/02/2015    Social History   Tobacco Use  . Smoking status: Never Smoker  . Smokeless tobacco: Never Used  .  Tobacco comment: smoking cessation materials not required  Substance Use Topics  . Alcohol use: No    Alcohol/week: 0.0 standard drinks     Current Outpatient Medications:  .  atorvastatin (LIPITOR) 20 MG tablet, TAKE 1 TABLET BY MOUTH AT BEDTIME FOR CHOLESTEROL, Disp: 30 tablet, Rfl: 5 .  clindamycin (CLEOCIN T) 1 % lotion, Apply topically daily., Disp: , Rfl:  .  gabapentin (NEURONTIN) 300 MG capsule, TAKE 1 CAPSULE BY MOUTH AT BEDTIME, Disp: 30 capsule, Rfl: 2 .  hydrocortisone cream 0.5 %, Apply 1 application topically 2 (two) times daily., Disp: 30 g, Rfl: 0 .  levothyroxine (SYNTHROID) 100 MCG tablet, TAKE 1 TABLET BY MOUTH EVERY MORNING ON AN EMPTY STOMACH WITH A GLASSOF WATER, 1 HOURS BEFORE BREAKFAST, Disp: 30 tablet, Rfl: 2 .  LORazepam (ATIVAN) 0.5 MG tablet, Take 0.5 mg by mouth daily as needed for anxiety., Disp: , Rfl:  .  mupirocin ointment (BACTROBAN) 2 %, Apply topically., Disp: , Rfl:  .  PARoxetine (PAXIL) 30 MG tablet, TAKE 1 TABLET BY MOUTH AT BEDTIME FOR DEPRESSION, Disp: 30 tablet, Rfl: 1 .  Probiotic Product (PROBIOTIC-10 ULTIMATE) CAPS, TAKE 1 CAPSULE BY MOUTH EVERY DAY., Disp: 30 capsule, Rfl: 11 .  sodium chloride (OCEAN) 0.65 % SOLN nasal spray, Place 1 spray into both nostrils as needed for congestion. Dc after one week, Disp: 15 mL, Rfl: 0 .  triamcinolone cream (KENALOG) 0.1 %, Apply 1 application topically 2 (two) times daily. For two weeks and stop, if no improvement return for follow up, Disp: 45 g, Rfl: 0 .  triamterene-hydrochlorothiazide (MAXZIDE-25) 37.5-25 MG tablet, TAKE 1/2 TABLET BY MOUTH EVERY DAY,  Disp: 15 tablet, Rfl: 11 .  VIACTIV CALCIUM PLUS D 650-12.5-40 MG-MCG CHEW, CHEW AND SWALLOW ONE PIECE TWICE DAILY. (SUPPLEMENT) CARAMEL, Disp: 60 tablet, Rfl: 12  No Known Allergies  I personally reviewed active problem list, medication list, allergies, notes from last encounter, lab results with the patient/caregiver today.  ROS  Constitutional:  Negative for fever or weight change.  Respiratory: Negative for cough and shortness of breath.   Cardiovascular: Negative for chest pain or palpitations.  Gastrointestinal: Negative for abdominal pain, no bowel changes.  Musculoskeletal: Negative for gait problem or joint swelling.  Skin: Negative for rash.  Neurological: Negative for dizziness or headache.  No other specific complaints in a complete review of systems (except as listed in HPI above).  Objective  Virtual encounter, vitals not obtained.  There is no height or weight on file to calculate BMI.  Nursing Note and Vital Signs reviewed.  Physical Exam  Exam unable to be complete.  Report from caregiver is that patient is active in her day program today, behavior is baseline, and she is not having any complaints of pain.  No results found for this or any previous visit (from the past 72 hour(s)).  Assessment & Plan  1. Acute cystitis without hematuria - In an abundance of caution, we will treat with 3 day course of Bactrim.  If still having symptoms next week, we will determine if  she needs to come in for urine sample. - sulfamethoxazole-trimethoprim (BACTRIM DS) 800-160 MG tablet; Take 1 tablet by mouth 2 (two) times daily for 3 days.  Dispense: 6 tablet; Refill: 0  -Red flags and when to present for emergency care or RTC including fever >101.12F, chest pain, shortness of breath, new/worsening/un-resolving symptoms, flank/back/abdominal pain, fevers/chills, behvioral changes reviewed with patient at time of visit. Follow up and care instructions discussed and provided in AVS. - I discussed the assessment and treatment plan with the patient. The patient was provided an opportunity to ask questions and all were answered. The patient agreed with the plan and demonstrated an understanding of the instructions.  - The patient was advised to call back or seek an in-person evaluation if the symptoms worsen or if the condition fails to  improve as anticipated.  I provided 11 minutes of non-face-to-face time during this encounter.  Hubbard Hartshorn, FNP

## 2018-08-14 DIAGNOSIS — F339 Major depressive disorder, recurrent, unspecified: Secondary | ICD-10-CM | POA: Diagnosis not present

## 2018-08-30 ENCOUNTER — Other Ambulatory Visit: Payer: Self-pay

## 2018-08-30 ENCOUNTER — Ambulatory Visit: Payer: Medicare Other | Admitting: Family Medicine

## 2018-09-04 ENCOUNTER — Ambulatory Visit: Payer: Medicare Other | Admitting: Family Medicine

## 2018-09-25 ENCOUNTER — Ambulatory Visit (INDEPENDENT_AMBULATORY_CARE_PROVIDER_SITE_OTHER): Payer: Medicare Other | Admitting: Family Medicine

## 2018-09-25 ENCOUNTER — Other Ambulatory Visit: Payer: Self-pay

## 2018-09-25 ENCOUNTER — Encounter: Payer: Self-pay | Admitting: Family Medicine

## 2018-09-25 ENCOUNTER — Other Ambulatory Visit (HOSPITAL_COMMUNITY)
Admission: RE | Admit: 2018-09-25 | Discharge: 2018-09-25 | Disposition: A | Discharge status: Home / Self Care | Payer: Medicare Other | Source: Ambulatory Visit | Attending: Family Medicine | Admitting: Family Medicine

## 2018-09-25 VITALS — BP 116/74 | HR 86 | Temp 97.6°F | Resp 16 | Ht 67.0 in | Wt 197.3 lb

## 2018-09-25 DIAGNOSIS — N898 Other specified noninflammatory disorders of vagina: Secondary | ICD-10-CM

## 2018-09-25 DIAGNOSIS — Z124 Encounter for screening for malignant neoplasm of cervix: Secondary | ICD-10-CM

## 2018-09-25 DIAGNOSIS — R300 Dysuria: Secondary | ICD-10-CM | POA: Diagnosis not present

## 2018-09-25 DIAGNOSIS — N949 Unspecified condition associated with female genital organs and menstrual cycle: Secondary | ICD-10-CM | POA: Diagnosis not present

## 2018-09-25 LAB — POCT URINALYSIS DIPSTICK
Bilirubin, UA: NEGATIVE
Blood, UA: NEGATIVE
Glucose, UA: NEGATIVE
Ketones, UA: NEGATIVE
Nitrite, UA: NEGATIVE
Protein, UA: NEGATIVE
Spec Grav, UA: 1.01 (ref 1.010–1.025)
Urobilinogen, UA: 0.2 E.U./dL
pH, UA: 5 (ref 5.0–8.0)

## 2018-09-25 NOTE — Progress Notes (Signed)
Name: Rebecca Bruce   MRN: [id number]    DOB: 10-07-1960   Date:09/25/2018       Progress Note  Subjective  Chief Complaint  Chief Complaint  Patient presents with  . Vaginal Pain    Onset-1 week, discharge, burning when she has to go to the bathroom    HPI  Vaginal discomfort: described as burning, constant, worse when she voids or wipes, no discharge, no blood on underwear, never sexually active, no fever or chills. No hematuria, she has noticed urinary frequency and also. She is a poor historian and came in with caregiver from [place]    Patient Active Problem List   Diagnosis Date Noted  . 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 Hyperglycemia 07/13/2018  . Pre-diabetes 01/05/2017  . Other symptoms and signs involving cognitive functions and awareness 09/01/2015  . Dyslipidemia 07/02/2015  . History of hyperthyroidism 07/02/2015  . History of radioactive iodine thyroid ablation 07/02/2015  . Hypothyroidism (acquired) 07/02/2015  . Obesity 07/02/2015  . Hypertension, benign 07/02/2015  . Anxiety 07/02/2015  . Vitamin D deficiency 07/02/2015  . Insomnia 07/02/2015  . OA (osteoarthritis) of knee 07/02/2015  . GERD (gastroesophageal reflux disease) 07/02/2015  . Hyperlipidemia, unspecified 07/02/2015    Past Surgical History:  Procedure Laterality Date  . DENTAL SURGERY      Family History  Problem Relation Age of Onset  . Heart disease Mother   . Breast cancer Neg Hx     Social History   Socioeconomic History  . Marital status: Single    Spouse name: Not on file  . Number of children: 0  . Years of education: Not on file  . Highest education level: Not on file  Occupational History  . Occupation: Disabled  Social Needs  . Financial resource strain: Not hard at all  . Food insecurity    Worry: Never true    Inability: Never true  . Transportation needs    Medical: No    Non-medical: No  Tobacco Use  . Smoking status: Never Smoker  .  Smokeless tobacco: Never Used  . Tobacco comment: smoking cessation materials not required  Substance and Sexual Activity  . Alcohol use: No    Alcohol/week: 0.0 standard drinks  . Drug use: No  . Sexual activity: Never  Lifestyle  . Physical activity    Days per week: 7 days    Minutes per session: 90 min  . Stress: Not at all  Relationships  . Social Herbalist on phone: Once a week    Gets together: Once a week    Attends religious service: More than 4 times per year    Active member of club or organization: Yes    Attends meetings of clubs or organizations: More than 4 times per year    Relationship status: Never married  . Intimate partner violence    Fear of current or ex partner: No    Emotionally abused: No    Physically abused: No    Forced sexual activity: No  Other Topics Concern  . Not on file  Social History Narrative   Stays At [facility]   She goes to church with her brother every Sunday   Participate in group activities   Works part time at the Day program      Current Outpatient Medications:  .  atorvastatin (LIPITOR) 20 MG tablet, TAKE 1 TABLET BY MOUTH AT BEDTIME FOR CHOLESTEROL, Disp: 30  tablet, Rfl: 5 .  clindamycin (CLEOCIN T) 1 % lotion, Apply topically daily., Disp: , Rfl:  .  gabapentin (NEURONTIN) 300 MG capsule, TAKE 1 CAPSULE BY MOUTH AT BEDTIME, Disp: 30 capsule, Rfl: 2 .  hydrocortisone cream 0.5 %, Apply 1 application topically 2 (two) times daily., Disp: 30 g, Rfl: 0 .  levothyroxine (SYNTHROID) 100 MCG tablet, TAKE 1 TABLET BY MOUTH EVERY MORNING ON AN EMPTY STOMACH WITH A GLASSOF WATER, 1 HOURS BEFORE BREAKFAST, Disp: 30 tablet, Rfl: 2 .  LORazepam (ATIVAN) 0.5 MG tablet, Take 0.5 mg by mouth daily as needed for anxiety., Disp: , Rfl:  .  mupirocin ointment (BACTROBAN) 2 %, Apply topically., Disp: , Rfl:  .  PARoxetine (PAXIL) 30 MG tablet, TAKE 1 TABLET BY MOUTH AT BEDTIME FOR DEPRESSION, Disp: 30 tablet,  Rfl: 1 .  Probiotic Product (PROBIOTIC-10 ULTIMATE) CAPS, TAKE 1 CAPSULE BY MOUTH EVERY DAY., Disp: 30 capsule, Rfl: 11 .  sodium chloride (OCEAN) 0.65 % SOLN nasal spray, Place 1 spray into both nostrils as needed for congestion. Dc after one week, Disp: 15 mL, Rfl: 0 .  triamcinolone cream (KENALOG) 0.1 %, Apply 1 application topically 2 (two) times daily. For two weeks and stop, if no improvement return for follow up, Disp: 45 g, Rfl: 0 .  triamterene-hydrochlorothiazide (MAXZIDE-25) 37.5-25 MG tablet, TAKE 1/2 TABLET BY MOUTH EVERY DAY, Disp: 15 tablet, Rfl: 11 .  VIACTIV CALCIUM PLUS D 650-12.5-40 MG-MCG CHEW, CHEW AND SWALLOW ONE PIECE TWICE DAILY. (SUPPLEMENT) CARAMEL, Disp: 60 tablet, Rfl: 12  No Known Allergies  I personally reviewed active problem list, medication list, allergies, family history, social history with the patient/caregiver today.   ROS  Ten systems reviewed and is negative except as mentioned in HPI   Objective  Vitals:   09/25/18 1438  BP: 116/74  Pulse: 86  Resp: 16  Temp: 97.6 F (36.4 C)  TempSrc: Oral  SpO2: 99%  Weight: 197 lb 4.8 oz (89.5 kg)  Height: 5\' 7"  (1.702 m)    Body mass index is 30.9 kg/m.  Physical Exam  Constitutional: Patient appears well-developed and well-nourished. Obese  No distress.  HEENT: head atraumatic, normocephalic, pupils equal and reactive to light, neck supple GYN: attempted to do a pelvic exam and she got very agitated, tried to do an external exam but she also did not cooperate, seems to have discomfort with palpation of external genitalia but no lesions or discharge seen Cardiovascular: Normal rate, regular rhythm and normal heart sounds.  No murmur heard. No BLE edema. Pulmonary/Chest: Effort normal and breath sounds normal. No respiratory distress. Abdominal: Soft.  There is no tenderness. Psychiatric: Patient was agitated and nervous during exam , but calm prior to it   Recent Results (from the past 2160  hour(s))  Lipid panel     Status: None   Collection Time: 07/13/18 10:23 AM  Result Value Ref Range   Cholesterol 159 <200 mg/dL   HDL 56 > OR = 50 mg/dL   Triglycerides 63 <150 mg/dL   LDL Cholesterol (Calc) 88 mg/dL (calc)    Comment: Reference range: <100 . Desirable range <100 mg/dL for primary prevention;   <70 mg/dL for patients with CHD or diabetic patients  with > or = 2 CHD risk factors. Marland Kitchen LDL-C is now calculated using the Martin-Hopkins  calculation, which is a validated novel method providing  better accuracy than the Friedewald equation in the  estimation of LDL-C.  Cresenciano Genre et al. Annamaria Helling. 5093;267(12):  2061-2068  (http://education.[facility].com/faq/FAQ164)    Total CHOL/HDL Ratio 2.8 <5.0 (calc)   Non-HDL Cholesterol (Calc) 103 <130 mg/dL (calc)    Comment: For patients with diabetes plus 1 major ASCVD risk  factor, treating to a non-HDL-C goal of <100 mg/dL  (LDL-C of <70 mg/dL) is considered a therapeutic  option.   CBC with Differential/Platelet     Status: Abnormal   Collection Time: 07/13/18 10:23 AM  Result Value Ref Range   WBC 5.5 3.8 - 10.8 Thousand/uL   RBC 5.17 (H) 3.80 - 5.10 Million/uL   Hemoglobin 13.8 11.7 - 15.5 g/dL   HCT 42.5 35.0 - 45.0 %   MCV 82.2 80.0 - 100.0 fL   MCH 26.7 (L) 27.0 - 33.0 pg   MCHC 32.5 32.0 - 36.0 g/dL   RDW 13.1 11.0 - 15.0 %   Platelets 305 140 - 400 Thousand/uL   MPV 10.3 7.5 - 12.5 fL   Neutro Abs 2,431 1,500 - 7,800 cells/uL   Lymphs Abs 2,112 850 - 3,900 cells/uL   Absolute Monocytes 820 200 - 950 cells/uL   Eosinophils Absolute 110 15 - 500 cells/uL   Basophils Absolute 28 0 - 200 cells/uL   Neutrophils Relative % 44.2 %   Total Lymphocyte 38.4 %   Monocytes Relative 14.9 %   Eosinophils Relative 2.0 %   Basophils Relative 0.5 %  COMPLETE METABOLIC PANEL WITH GFR     Status: Abnormal   Collection Time: 07/13/18 10:23 AM  Result Value Ref Range   Glucose, Bld 101 (H) 65 - 99 mg/dL    Comment: .             Fasting reference interval . For someone without known diabetes, a glucose value between 100 and 125 mg/dL is consistent with prediabetes and should be confirmed with a follow-up test. .    BUN 14 7 - 25 mg/dL   Creat 1.00 0.50 - 1.05 mg/dL    Comment: For patients >38 years of age, the reference limit for Creatinine is approximately 13% higher for people identified as African-American. .    GFR, Est Non African American 63 > OR = 60 mL/min/1.31m2   GFR, Est African American 73 > OR = 60 mL/min/1.4m2   BUN/Creatinine Ratio NOT APPLICABLE 6 - 22 (calc)   Sodium 142 135 - 146 mmol/L   Potassium 3.6 3.5 - 5.3 mmol/L   Chloride 104 98 - 110 mmol/L   CO2 31 20 - 32 mmol/L   Calcium 9.3 8.6 - 10.4 mg/dL   Total Protein 7.0 6.1 - 8.1 g/dL   Albumin 4.2 3.6 - 5.1 g/dL   Globulin 2.8 1.9 - 3.7 g/dL (calc)   AG Ratio 1.5 1.0 - 2.5 (calc)   Total Bilirubin 0.6 0.2 - 1.2 mg/dL   Alkaline phosphatase (APISO) 87 37 - 153 U/L   AST 17 10 - 35 U/L   ALT 21 6 - 29 U/L  Hemoglobin A1c     Status: Abnormal   Collection Time: 07/13/18 10:23 AM  Result Value Ref Range   Hgb A1c MFr Bld 6.0 (H) <5.7 % of total Hgb    Comment: For someone without known diabetes, a hemoglobin  A1c value between 5.7% and 6.4% is consistent with prediabetes and should be confirmed with a  follow-up test. . For someone with known diabetes, a value <7% indicates that their diabetes is well controlled. A1c targets should be individualized based on duration of diabetes, age, comorbid conditions, and  other considerations. . This assay result is consistent with an increased risk of diabetes. . Currently, no consensus exists regarding use of hemoglobin A1c for diagnosis of diabetes for children. .    Mean Plasma Glucose 126 (calc)   eAG (mmol/L) 7.0 (calc)  TSH     Status: None   Collection Time: 07/13/18 10:23 AM  Result Value Ref Range   TSH 2.22 0.40 - 4.50 mIU/L  POCT urinalysis dipstick      Status: Abnormal   Collection Time: 09/25/18  2:47 PM  Result Value Ref Range   Color, UA yellow    Clarity, UA clear    Glucose, UA Negative Negative   Bilirubin, UA neg    Ketones, UA neg    Spec Grav, UA 1.010 1.010 - 1.025   Blood, UA neg    pH, UA 5.0 5.0 - 8.0   Protein, UA Negative Negative   Urobilinogen, UA 0.2 0.2 or 1.0 E.U./dL   Nitrite, UA neg    Leukocytes, UA Moderate (2+) (A) Negative   Appearance     Odor       PHQ2/9: Depression screen [facility] 2/9 09/25/2018 08/03/2018 07/11/2017 03/13/2017 01/20/2016  Decreased Interest 0 0 0 0 0  Down, Depressed, Hopeless 0 0 0 0 0  PHQ - 2 Score 0 0 0 0 0  Altered sleeping 0 0 0 - -  Tired, decreased energy 0 0 0 - -  Change in appetite 0 0 0 - -  Feeling bad or failure about yourself  0 0 0 - -  Trouble concentrating 0 0 0 - -  Moving slowly or fidgety/restless 0 0 0 - -  Suicidal thoughts 0 0 0 - -  PHQ-9 Score 0 0 0 - -  Difficult doing work/chores Not difficult at all Not difficult at all Not difficult at all - -    phq 9 is negative, but unsure if reliable due to her MR  Fall Risk: Fall Risk  08/03/2018 07/13/2018 03/16/2018 11/13/2017 07/11/2017  Falls in the past year? 0 0 0 No No  Number falls in past yr: 0 0 - - -  Injury with Fall? 0 0 - - -  Risk for fall due to : - - - - Other (Comment);Medication side effect  Risk for fall due to: Comment - - - - Mental retardation  Follow up Falls evaluation completed - - - -     Functional Status Survey: Is the patient deaf or have difficulty hearing?: No Does the patient have difficulty seeing, even when wearing glasses/contacts?: No Does the patient have difficulty concentrating, remembering, or making decisions?: No Does the patient have difficulty walking or climbing stairs?: No Does the patient have difficulty dressing or bathing?: No Does the patient have difficulty doing errands alone such as visiting a doctor's office or shopping?: Yes    Assessment & Plan   1.  Vaginal discharge  - POCT urinalysis dipstick - Cervicovaginal ancillary only  2. Vaginal burning  - POCT urinalysis dipstick - Cervicovaginal ancillary only  3. Cervical cancer screening  - Cytology - PAP  4. Dysuria  - CULTURE, URINE COMPREHENSIVE

## 2018-09-27 LAB — CULTURE, URINE COMPREHENSIVE
MICRO NUMBER:: 575772
RESULT:: NO GROWTH
SPECIMEN QUALITY:: ADEQUATE

## 2018-09-27 LAB — URINE CYTOLOGY ANCILLARY ONLY
Chlamydia: NEGATIVE
Neisseria Gonorrhea: NEGATIVE
Trichomonas: NEGATIVE

## 2018-10-03 ENCOUNTER — Other Ambulatory Visit: Payer: Self-pay | Admitting: Family Medicine

## 2018-10-03 DIAGNOSIS — F72 Severe intellectual disabilities: Secondary | ICD-10-CM

## 2018-11-01 ENCOUNTER — Other Ambulatory Visit: Payer: Self-pay | Admitting: Family Medicine

## 2018-11-01 DIAGNOSIS — I10 Essential (primary) hypertension: Secondary | ICD-10-CM

## 2018-11-01 DIAGNOSIS — E785 Hyperlipidemia, unspecified: Secondary | ICD-10-CM

## 2018-12-04 ENCOUNTER — Other Ambulatory Visit: Payer: Self-pay | Admitting: Family Medicine

## 2018-12-04 NOTE — Telephone Encounter (Signed)
Requested medication (s) are due for refill today: yes Requested medication (s) are on the active medication list: yes  Last refill:  01/04/2018  Future visit scheduled: yes  Notes to clinic: review for refill   Requested Prescriptions  Pending Prescriptions Disp Refills   Probiotic Product (PROBIOTIC-10 ULTIMATE) CAPS [Pharmacy Med Name: [id number] CAPSULES] 30 capsule 11    Sig: One capsule daily     There is no refill protocol information for this order

## 2018-12-31 ENCOUNTER — Other Ambulatory Visit: Payer: Self-pay | Admitting: Family Medicine

## 2019-01-11 ENCOUNTER — Other Ambulatory Visit: Payer: Self-pay

## 2019-01-11 ENCOUNTER — Encounter (INDEPENDENT_AMBULATORY_CARE_PROVIDER_SITE_OTHER): Payer: Medicare Other | Admitting: Family Medicine

## 2019-01-13 NOTE — Progress Notes (Signed)
Patient not seen, legal guardian was not present

## 2019-01-17 DIAGNOSIS — Z23 Encounter for immunization: Secondary | ICD-10-CM | POA: Diagnosis not present

## 2019-01-30 ENCOUNTER — Encounter: Payer: Self-pay | Admitting: Family Medicine

## 2019-01-30 ENCOUNTER — Other Ambulatory Visit: Payer: Self-pay

## 2019-01-30 ENCOUNTER — Ambulatory Visit (INDEPENDENT_AMBULATORY_CARE_PROVIDER_SITE_OTHER): Payer: Medicare Other | Admitting: Family Medicine

## 2019-01-30 VITALS — BP 125/94 | HR 72 | Wt 200.0 lb

## 2019-01-30 DIAGNOSIS — R7303 Prediabetes: Secondary | ICD-10-CM | POA: Diagnosis not present

## 2019-01-30 DIAGNOSIS — E785 Hyperlipidemia, unspecified: Secondary | ICD-10-CM

## 2019-01-30 DIAGNOSIS — Z6830 Body mass index (BMI) 30.0-30.9, adult: Secondary | ICD-10-CM | POA: Diagnosis not present

## 2019-01-30 DIAGNOSIS — E559 Vitamin D deficiency, unspecified: Secondary | ICD-10-CM | POA: Diagnosis not present

## 2019-01-30 DIAGNOSIS — E039 Hypothyroidism, unspecified: Secondary | ICD-10-CM

## 2019-01-30 DIAGNOSIS — E6609 Other obesity due to excess calories: Secondary | ICD-10-CM | POA: Diagnosis not present

## 2019-01-30 DIAGNOSIS — R739 Hyperglycemia, unspecified: Secondary | ICD-10-CM

## 2019-01-30 DIAGNOSIS — I10 Essential (primary) hypertension: Secondary | ICD-10-CM

## 2019-01-30 DIAGNOSIS — F70 Mild intellectual disabilities: Secondary | ICD-10-CM | POA: Diagnosis not present

## 2019-01-30 NOTE — Progress Notes (Signed)
Name: Rebecca Bruce   MRN: [id number]    DOB: October 30, 1960   Date:01/30/2019       Progress Note  Subjective  Chief Complaint  Chief Complaint  Patient presents with  . Hypertension  . Hypothyroidism  . Prediabetes    I connected with  [name] on 01/30/19 at 10:40 AM EDT by telephone and verified that I am speaking with the correct person using two identifiers.  I discussed the limitations, risks, security and privacy concerns of performing an evaluation and management service by telephone and the availability of in person appointments. Staff also discussed with the patient that there may be a patient responsible charge related to this service. Patient Location: at the group home  Provider Location: [facility] Additional Individuals present: caregiver - Demitris her caregiver   HPI  HTN: taking medication as prescribed, bp today was checked at home and it was a little elevated, but usually normal in our office and we will hold off on adjusting medication and this time, no chest pain or palpitation. No side effects of medication   Hyperlipidemia: taking medication as prescribed and no problems, denies myalgias. She lives in a group home andlikes to [employer] likes to sweep the floor. She is on a low salt diet   Hypothyroidism :used to seeDr. Rosario Jacks, s/p ablation for hyperthyroidism, doing well on Synthroid 100 mcg daily, last TSH was done April 2020 and it was at goal, previous levels checked yearly also normal   Obesity:She continues to go for daily walks. She has been avoiding desserts. Weight is stable even through COVID-19  MR: stable, sees psychiatrist, Dr. Jonette Pesa her behavior, she lives in a group home, her brother sees her once a week and takes her to church, however because of COVID-19 she cannot see him right now  She needs assistance bathing and dressing, managing her money.Her brother [name] is the guardian. He was not  present during Telemedicine   Pre-diabetes: hgbA1C was 6.3%In 2017, down to 6.0% last year , sheis only eating dessert a couple of times a week. Shedenies polyphagia, polydipsia or polyuria. Weight today was checked at home and unchanged   Decrease shoulder range of motion: difficulty with abduction, she had home PT and states feeling better   Patient Active Problem List   Diagnosis Date Noted  . 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 Hyperglycemia 07/13/2018  . Pre-diabetes 01/05/2017  . Other symptoms and signs involving cognitive functions and awareness 09/01/2015  . Dyslipidemia 07/02/2015  . History of hyperthyroidism 07/02/2015  . History of radioactive iodine thyroid ablation 07/02/2015  . Hypothyroidism (acquired) 07/02/2015  . Obesity 07/02/2015  . Hypertension, benign 07/02/2015  . Anxiety 07/02/2015  . Vitamin D deficiency 07/02/2015  . Insomnia 07/02/2015  . OA (osteoarthritis) of knee 07/02/2015  . GERD (gastroesophageal reflux disease) 07/02/2015  . Hyperlipidemia, unspecified 07/02/2015    Past Surgical History:  Procedure Laterality Date  . DENTAL SURGERY      Family History  Problem Relation Age of Onset  . Heart disease Mother   . Breast cancer Neg Hx     Social History   Socioeconomic History  . Marital status: Single    Spouse name: Not on file  . Number of children: 0  . Years of education: Not on file  . Highest education level: Not on file  Occupational History  . Occupation: Disabled  Social Needs  . Financial resource strain: Not hard at all  . Food insecurity  Worry: Never true    Inability: Never true  . Transportation needs    Medical: No    Non-medical: No  Tobacco Use  . Smoking status: Never Smoker  . Smokeless tobacco: Never Used  . Tobacco comment: smoking cessation materials not required  Substance and Sexual Activity  . Alcohol use: No    Alcohol/week: 0.0 standard drinks  . Drug use: No  . Sexual  activity: Never  Lifestyle  . Physical activity    Days per week: 7 days    Minutes per session: 90 min  . Stress: Not at all  Relationships  . Social Herbalist on phone: Once a week    Gets together: Once a week    Attends religious service: More than 4 times per year    Active member of club or organization: Yes    Attends meetings of clubs or organizations: More than 4 times per year    Relationship status: Never married  . Intimate partner violence    Fear of current or ex partner: No    Emotionally abused: No    Physically abused: No    Forced sexual activity: No  Other Topics Concern  . Not on file  Social History Narrative   Stays At [employer], [place]   She goes to church with her brother every Sunday   Participate in group activities   Works part time at the [employer]      Current Outpatient Medications:  .  atorvastatin (LIPITOR) 20 MG tablet, TAKE 1 TABLET BY MOUTH AT BEDTIME FOR CHOLESTEROL, Disp: 30 tablet, Rfl: 5 .  clindamycin (CLEOCIN T) 1 % lotion, Apply topically daily., Disp: , Rfl:  .  gabapentin (NEURONTIN) 300 MG capsule, TAKE 1 CAPSULE BY MOUTH AT BEDTIME, Disp: 30 capsule, Rfl: 2 .  hydrocortisone cream 0.5 %, Apply 1 application topically 2 (two) times daily., Disp: 30 g, Rfl: 0 .  levothyroxine (SYNTHROID) 100 MCG tablet, TAKE 1 TABLET BY MOUTH EVERY MORNING ON AN EMPTY STOMACH WITH A GLASSOF WATER, 1 HOURS BEFORE BREAKFAST, Disp: 30 tablet, Rfl: 11 .  LORazepam (ATIVAN) 0.5 MG tablet, Take 0.5 mg by mouth daily as needed for anxiety., Disp: , Rfl:  .  mupirocin ointment (BACTROBAN) 2 %, Apply topically., Disp: , Rfl:  .  PARoxetine (PAXIL) 30 MG tablet, TAKE 1 TABLET BY MOUTH AT BEDTIME FOR DEPRESSION, Disp: 30 tablet, Rfl: 5 .  Probiotic Product (PROBIOTIC-10 ULTIMATE) CAPS, TAKE 1 CAPSULE BY MOUTH EVERY DAY., Disp: 30 capsule, Rfl: 11 .  triamcinolone cream (KENALOG) 0.1 %, Apply 1 application topically 2 (two) times  daily. For two weeks and stop, if no improvement return for follow up, Disp: 45 g, Rfl: 0 .  triamterene-hydrochlorothiazide (MAXZIDE-25) 37.5-25 MG tablet, TAKE 1/2 TABLET BY MOUTH EVERY DAY, Disp: 15 tablet, Rfl: 11 .  VIACTIV CALCIUM PLUS D 650-12.5-40 MG-MCG CHEW, CHEW AND SWALLOW ONE PIECE TWICE DAILY. (SUPPLEMENT) CARAMEL, Disp: 60 tablet, Rfl: 12 .  sodium chloride (OCEAN) 0.65 % SOLN nasal spray, Place 1 spray into both nostrils as needed for congestion. Dc after one week (Patient not taking: Reported on 01/30/2019), Disp: 15 mL, Rfl: 0  Not on File  I personally reviewed active problem list, medication list, allergies, family history, social history with the patient/caregiver today.   ROS  Ten systems reviewed and is negative except as mentioned in HPI , very difficulty to obtain from patient  She continues to complain of  vulva discomfort but refuses to have pelvic exam   Objective  Virtual encounter, vitals not obtained.  Body mass index is 31.32 kg/m.  Physical Exam  Awake, cooperative , smiling and in no distress   PHQ2/9: Depression screen [facility] 2/9 01/30/2019 09/25/2018 08/03/2018 07/11/2017 03/13/2017  Decreased Interest 0 0 0 0 0  Down, Depressed, Hopeless 0 0 0 0 0  PHQ - 2 Score 0 0 0 0 0  Altered sleeping 0 0 0 0 -  Tired, decreased energy 0 0 0 0 -  Change in appetite 0 0 0 0 -  Feeling bad or failure about yourself  0 0 0 0 -  Trouble concentrating 0 0 0 0 -  Moving slowly or fidgety/restless 0 0 0 0 -  Suicidal thoughts 0 0 0 0 -  PHQ-9 Score 0 0 0 0 -  Difficult doing work/chores - Not difficult at all Not difficult at all Not difficult at all -   PHQ-2/9 Result is negative.    Fall Risk: Fall Risk  08/03/2018 07/13/2018 03/16/2018 11/13/2017 07/11/2017  Falls in the past year? 0 0 0 No No  Number falls in past yr: 0 0 - - -  Injury with Fall? 0 0 - - -  Risk for fall due to : - - - - Other (Comment);Medication side effect  Risk for fall due to: Comment - - -  - Mental retardation  Follow up Falls evaluation completed - - - -     Assessment & Plan  1. Dyslipidemia  Recheck labs next visit   2. Hypertension, benign  Elevated at home, but normal over here   3. Pre-diabetes  Recheck level next visit   4. Hypothyroidism (acquired)  Recheck TSH on her next visit   5. Mild intellectual disabilities  Stable   6. Class 1 obesity due to excess calories without serious comorbidity with body mass index (BMI) of 30.0 to 30.9 in adult  Continue walking and daily desserts   7. Vitamin D deficiency   8. Hyperglycemia   I discussed the assessment and treatment plan with the patient. The patient was provided an opportunity to ask questions and all were answered. The patient agreed with the plan and demonstrated an understanding of the instructions.   The patient was advised to call back or seek an in-person evaluation if the symptoms worsen or if the condition fails to improve as anticipated.  I provided 25  minutes of non-face-to-face time during this encounter.  Loistine Chance, MD

## 2019-02-05 ENCOUNTER — Telehealth: Payer: Self-pay | Admitting: Family Medicine

## 2019-02-05 DIAGNOSIS — L282 Other prurigo: Secondary | ICD-10-CM

## 2019-02-05 MED ORDER — HYDROCORTISONE 0.5 % EX CREA: 1.0000 "application " | TOPICAL_CREAM | Freq: Two times a day (BID) | CUTANEOUS | 0 refills | Status: DC

## 2019-02-05 MED ORDER — LEVOTHYROXINE SODIUM 100 MCG PO TABS: 100.0000 ug | ORAL_TABLET | Freq: Every day | ORAL | 2 refills | Status: DC

## 2019-02-05 MED ORDER — ACETAMINOPHEN 500 MG PO TABS: 500.0000 mg | ORAL_TABLET | Freq: Four times a day (QID) | ORAL | 5 refills | Status: DC | PRN

## 2019-02-05 MED ORDER — GABAPENTIN 300 MG PO CAPS: 300.0000 mg | ORAL_CAPSULE | Freq: Every day | ORAL | 2 refills | Status: DC

## 2019-02-05 MED ORDER — CLINDAMYCIN PHOSPHATE 1 % EX LOTN: TOPICAL_LOTION | Freq: Every day | CUTANEOUS | 0 refills | Status: DC

## 2019-02-05 NOTE — Telephone Encounter (Signed)
Medication: gabapentin (NEURONTIN) 300 MG capsule [id number]   levothyroxine (SYNTHROID) 100 MCG tablet [id number]  hydrocortisone cream 0.5 % [id number]  clindamycin (CLEOCIN T) 1 % lotion [id number]  Tylenol 500mg    Will also need a order for a blood pressure check on every Thursday. Call MD if 140/90 or above    Pharmacy:  [place], [address] [phone] (Phone) [phone] (Fax)   Jeani Hawking calling with pharmacy and states that pt transferred medications and did not know she did have refills on Gabapentin and levothyroxine. Pt will be completely out today. Please advise

## 2019-02-08 NOTE — Telephone Encounter (Signed)
lvm for appt

## 2019-02-12 DIAGNOSIS — F339 Major depressive disorder, recurrent, unspecified: Secondary | ICD-10-CM | POA: Diagnosis not present

## 2019-03-08 ENCOUNTER — Encounter: Payer: Self-pay | Admitting: Emergency Medicine

## 2019-03-08 ENCOUNTER — Emergency Department
Admission: EM | Admit: 2019-03-08 | Discharge: 2019-03-08 | Disposition: A | Discharge status: Home / Self Care | Payer: Medicare Other | Source: Home / Self Care | Attending: Emergency Medicine | Admitting: Emergency Medicine

## 2019-03-08 ENCOUNTER — Ambulatory Visit (INDEPENDENT_AMBULATORY_CARE_PROVIDER_SITE_OTHER): Payer: Medicare Other | Admitting: Family Medicine

## 2019-03-08 ENCOUNTER — Encounter: Payer: Self-pay | Admitting: Family Medicine

## 2019-03-08 ENCOUNTER — Other Ambulatory Visit: Payer: Self-pay

## 2019-03-08 ENCOUNTER — Telehealth: Payer: Self-pay | Admitting: Emergency Medicine

## 2019-03-08 ENCOUNTER — Emergency Department: Payer: Medicare Other

## 2019-03-08 DIAGNOSIS — G9341 Metabolic encephalopathy: Secondary | ICD-10-CM | POA: Diagnosis not present

## 2019-03-08 DIAGNOSIS — N3000 Acute cystitis without hematuria: Secondary | ICD-10-CM | POA: Diagnosis not present

## 2019-03-08 DIAGNOSIS — E039 Hypothyroidism, unspecified: Secondary | ICD-10-CM | POA: Diagnosis not present

## 2019-03-08 DIAGNOSIS — E038 Other specified hypothyroidism: Secondary | ICD-10-CM | POA: Insufficient documentation

## 2019-03-08 DIAGNOSIS — N390 Urinary tract infection, site not specified: Secondary | ICD-10-CM | POA: Diagnosis not present

## 2019-03-08 DIAGNOSIS — A419 Sepsis, unspecified organism: Secondary | ICD-10-CM | POA: Diagnosis not present

## 2019-03-08 DIAGNOSIS — R4020 Unspecified coma: Secondary | ICD-10-CM | POA: Diagnosis not present

## 2019-03-08 DIAGNOSIS — Z79899 Other long term (current) drug therapy: Secondary | ICD-10-CM | POA: Insufficient documentation

## 2019-03-08 DIAGNOSIS — N3090 Cystitis, unspecified without hematuria: Secondary | ICD-10-CM | POA: Insufficient documentation

## 2019-03-08 DIAGNOSIS — R05 Cough: Secondary | ICD-10-CM | POA: Diagnosis not present

## 2019-03-08 DIAGNOSIS — I10 Essential (primary) hypertension: Secondary | ICD-10-CM | POA: Diagnosis not present

## 2019-03-08 DIAGNOSIS — U071 COVID-19: Secondary | ICD-10-CM | POA: Diagnosis not present

## 2019-03-08 DIAGNOSIS — R4182 Altered mental status, unspecified: Secondary | ICD-10-CM | POA: Diagnosis not present

## 2019-03-08 LAB — URINALYSIS, ROUTINE W REFLEX MICROSCOPIC
Bilirubin Urine: NEGATIVE
Glucose, UA: NEGATIVE mg/dL
Ketones, ur: NEGATIVE mg/dL
Nitrite: NEGATIVE
Protein, ur: 100 mg/dL — AB
Specific Gravity, Urine: 1.017 (ref 1.005–1.030)
pH: 7 (ref 5.0–8.0)

## 2019-03-08 LAB — COMPREHENSIVE METABOLIC PANEL
ALT: 26 U/L (ref 0–44)
AST: 38 U/L (ref 15–41)
Albumin: 4 g/dL (ref 3.5–5.0)
Alkaline Phosphatase: 72 U/L (ref 38–126)
Anion gap: 12 (ref 5–15)
BUN: 9 mg/dL (ref 6–20)
CO2: 26 mmol/L (ref 22–32)
Calcium: 9.5 mg/dL (ref 8.9–10.3)
Chloride: 97 mmol/L — ABNORMAL LOW (ref 98–111)
Creatinine, Ser: 0.89 mg/dL (ref 0.44–1.00)
GFR calc Af Amer: >60 mL/min (ref >60)
GFR calc non Af Amer: >60 mL/min (ref >60)
Glucose, Bld: 131 mg/dL — ABNORMAL HIGH (ref 70–99)
Potassium: 4.1 mmol/L (ref 3.5–5.1)
Sodium: 135 mmol/L (ref 135–145)
Total Bilirubin: 1 mg/dL (ref 0.3–1.2)
Total Protein: 7.9 g/dL (ref 6.5–8.1)

## 2019-03-08 LAB — CBC WITH DIFFERENTIAL/PLATELET
Abs Immature Granulocytes: 0.02 10*3/uL (ref 0.00–0.07)
Basophils Absolute: 0 10*3/uL (ref 0.0–0.1)
Basophils Relative: 0 %
Eosinophils Absolute: 0 10*3/uL (ref 0.0–0.5)
Eosinophils Relative: 0 %
HCT: 44.6 % (ref 36.0–46.0)
Hemoglobin: 14.6 g/dL (ref 12.0–15.0)
Immature Granulocytes: 0 %
Lymphocytes Relative: 11 %
Lymphs Abs: 0.7 10*3/uL (ref 0.7–4.0)
MCH: 26.4 pg (ref 26.0–34.0)
MCHC: 32.7 g/dL (ref 30.0–36.0)
MCV: 80.5 fL (ref 80.0–100.0)
Monocytes Absolute: 0.9 10*3/uL (ref 0.1–1.0)
Monocytes Relative: 13 %
Neutro Abs: 5.1 10*3/uL (ref 1.7–7.7)
Neutrophils Relative %: 76 %
Platelets: 217 10*3/uL (ref 150–400)
RBC: 5.54 MIL/uL — ABNORMAL HIGH (ref 3.87–5.11)
RDW: 13.8 % (ref 11.5–15.5)
WBC: 6.8 10*3/uL (ref 4.0–10.5)
nRBC: 0 % (ref 0.0–0.2)

## 2019-03-08 LAB — TSH: TSH: 2.866 u[IU]/mL (ref 0.350–4.500)

## 2019-03-08 LAB — T4, FREE: Free T4: 1.39 ng/dL — ABNORMAL HIGH (ref 0.61–1.12)

## 2019-03-08 IMAGING — CT CT HEAD W/O CM
3 of 4 series · 16 of 47 positions shown, 19 images · non-contrast
Comparison: None.

CLINICAL DATA: Altered level of consciousness.  Hypertension.

EXAM:
CT HEAD WITHOUT CONTRAST
TECHNIQUE: Contiguous axial images were obtained from the base of the skull
through the vertex without intravenous contrast.

[Series 3: head wo · axial · 0.41mm/px · z∈[+206,+326]mm · 10 of 28 slices shown, 13 images]
[im 2/28  brain]
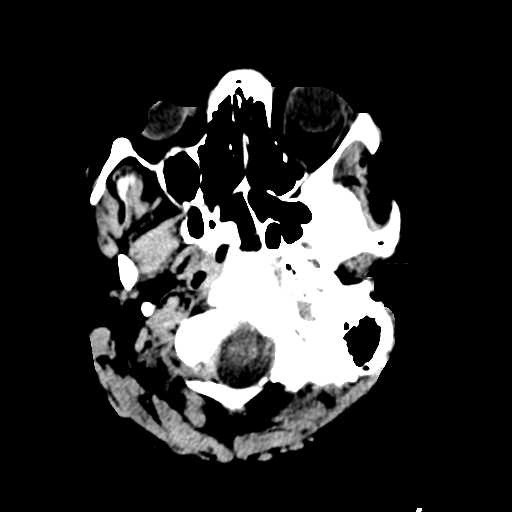
[im 2/28  bone]
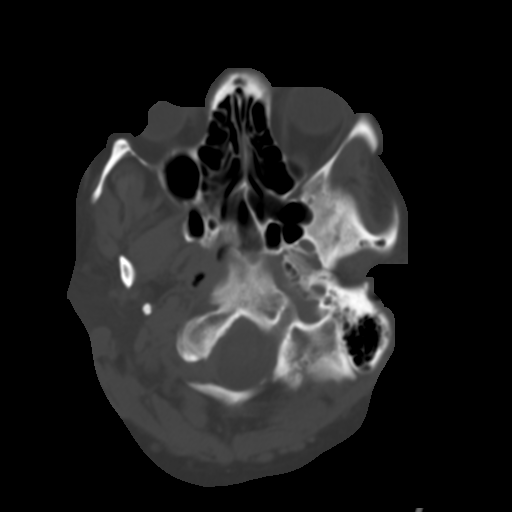
[im 4/28  brain]
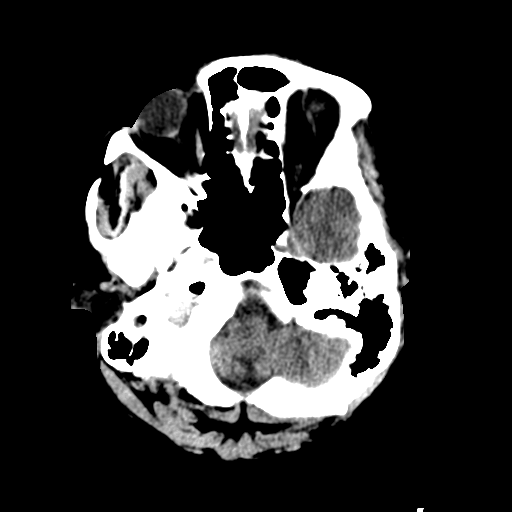
[im 8/28  brain]
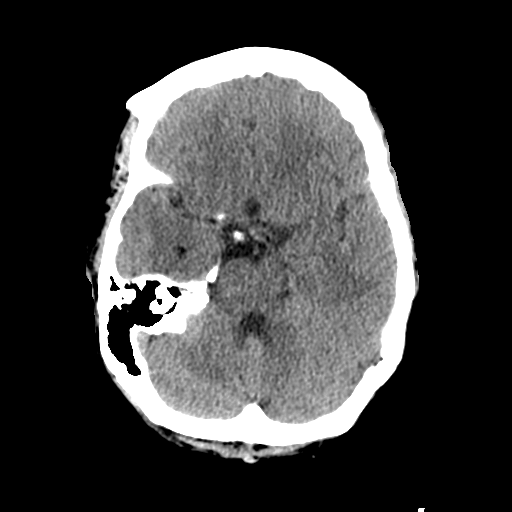
[im 10/28  brain]
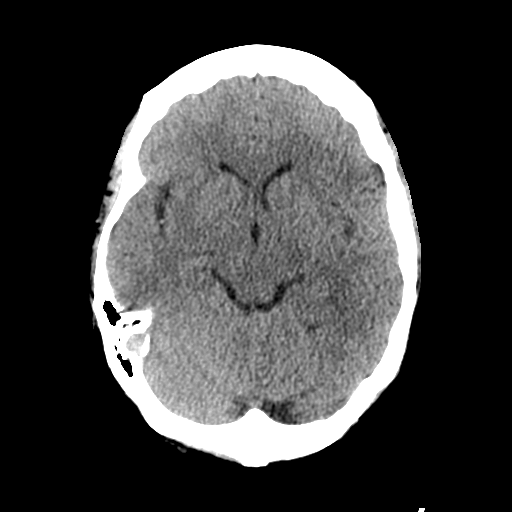
[im 12/28  brain]
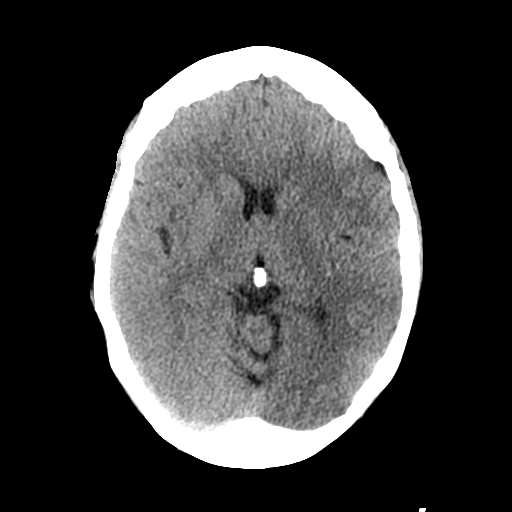
[im 12/28  bone]
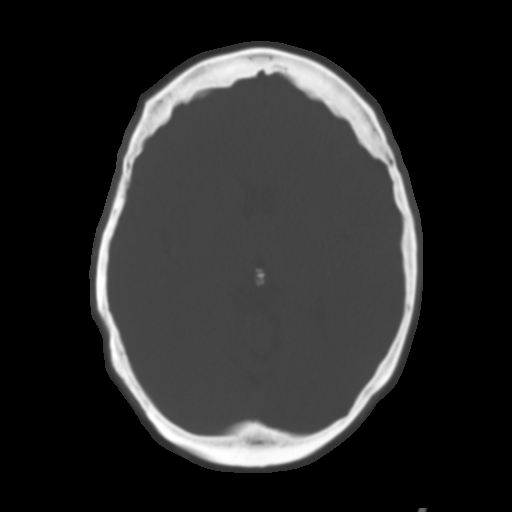
[im 16/28  brain]
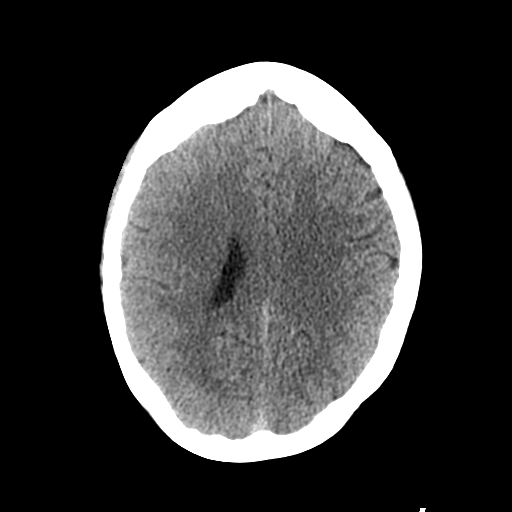
[im 18/28  brain]
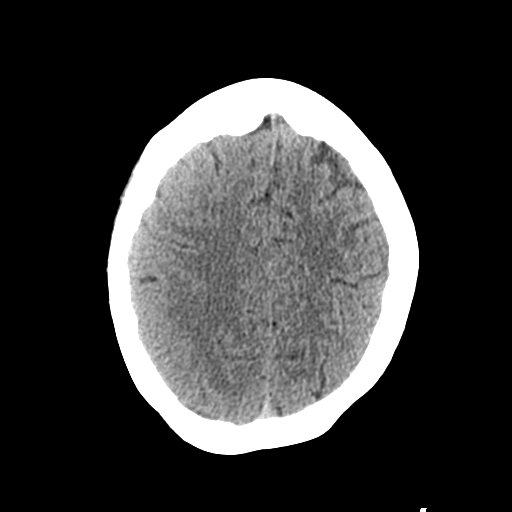
[im 20/28  brain]
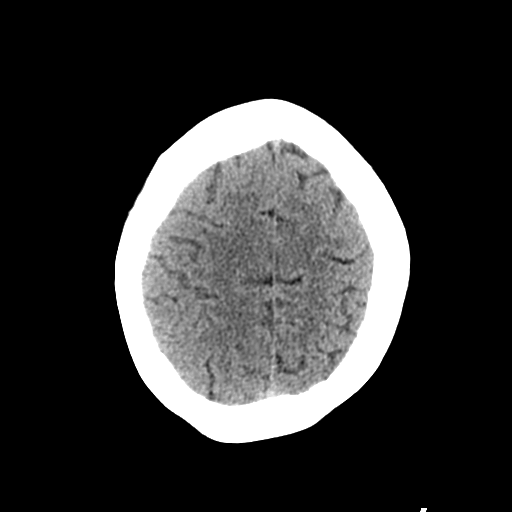
[im 24/28  brain]
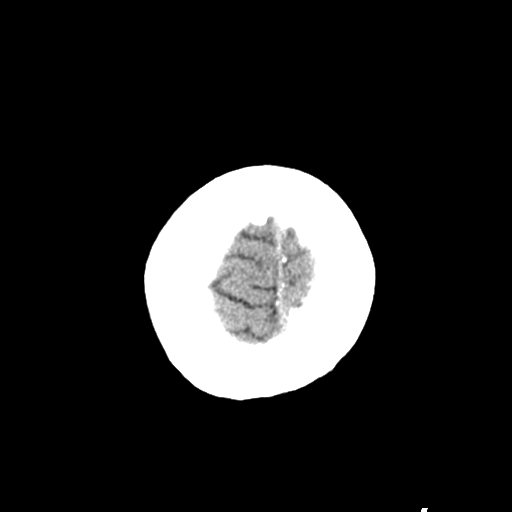
[im 24/28  bone]
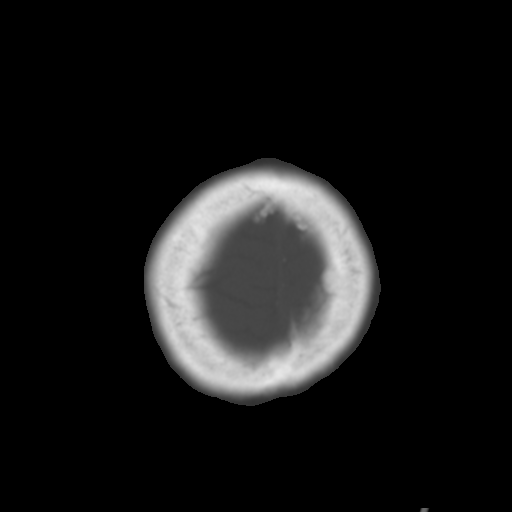
[im 26/28  brain]
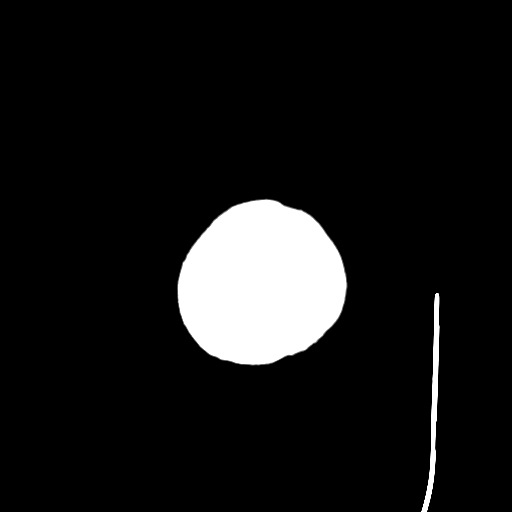

[Series 4: coronal soft tissue · coronal · 0.27mm/px · 3 of 61 slices shown]
[im 21/61  brain]
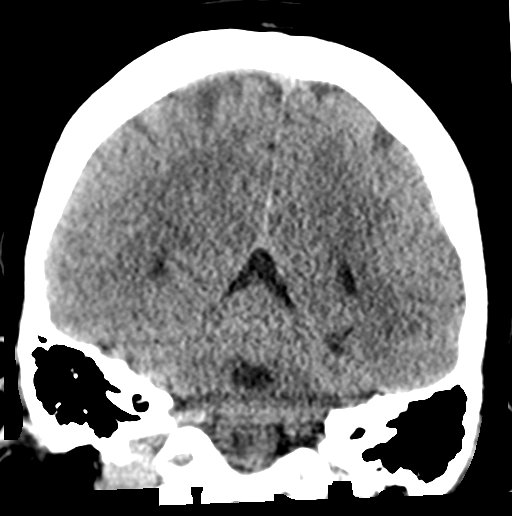
[im 27/61  brain]
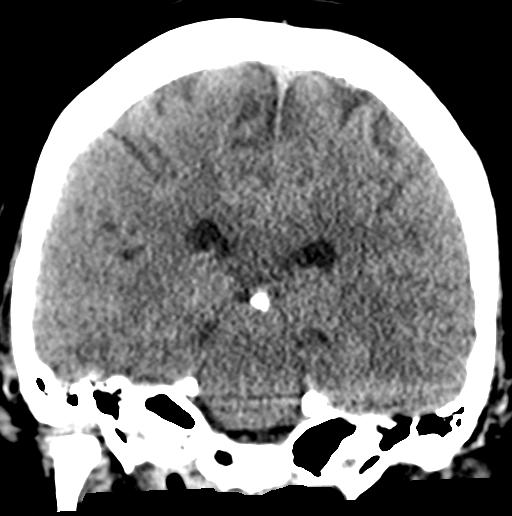
[im 34/61  brain]
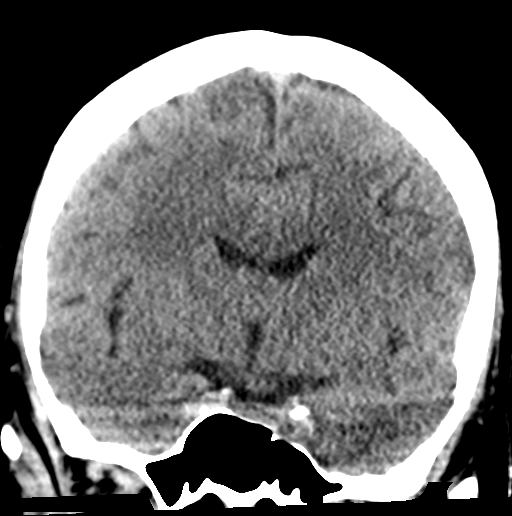

[Series 5: sagittal soft tissue · sagittal · 0.28mm/px · 3 of 47 slices shown]
[im 16/47  brain]
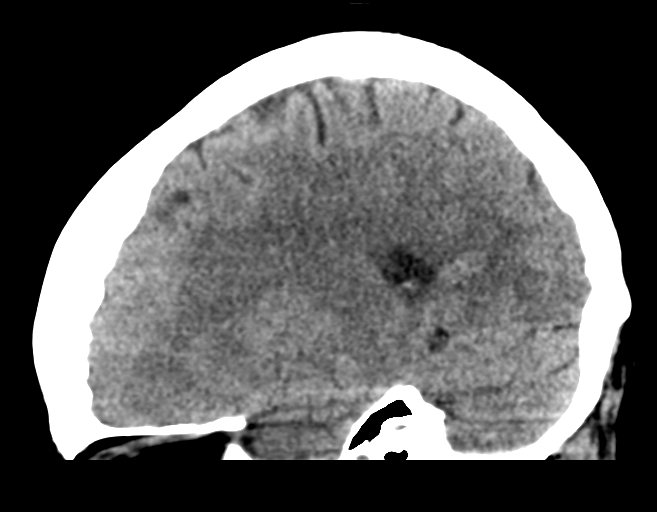
[im 24/47  brain]
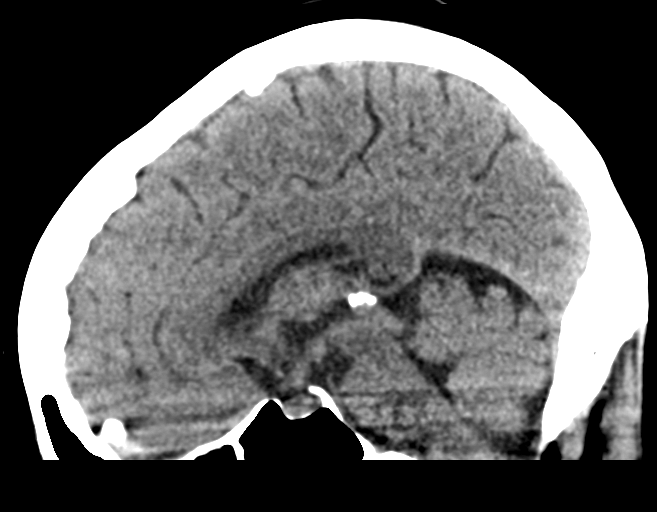
[im 31/47  brain]
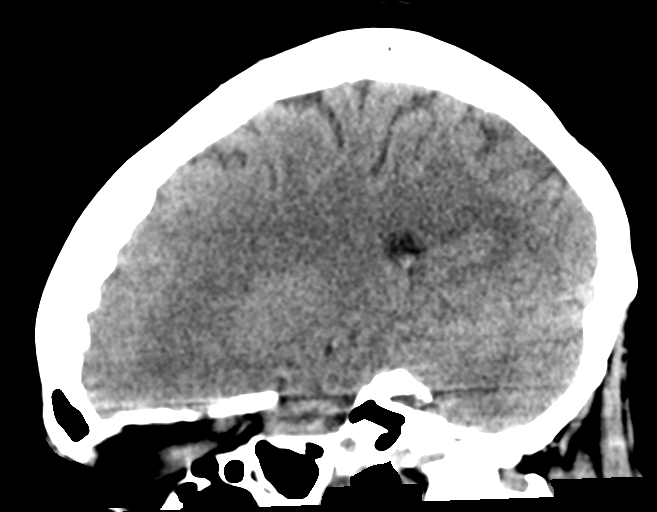

[16 of 47 positions shown; findings below may reference images not displayed]

FINDINGS: Brain: The ventricles are normal in size and configuration. There is
no intracranial mass, hemorrhage, extra-axial fluid collection, or
midline shift. The brain parenchyma appears unremarkable. There is
no evident acute infarct.

Vascular: There is no hyperdense vessel. There is no appreciable
vascular calcification.

Skull: The bony calvarium appears intact.

Sinuses/Orbits: There is mild mucosal thickening in several ethmoid
air cells. Other visualized paranasal sinuses are clear. Visualized
orbits appear symmetric bilaterally.

Other: Visualized mastoid air cells are clear.
IMPRESSION: Mild mucosal thickening in several ethmoid air cells. Study
otherwise unremarkable.

## 2019-03-08 MED ORDER — HALOPERIDOL LACTATE 5 MG/ML IJ SOLN: 5.0000 mg | Freq: Once | INTRAMUSCULAR | Status: DC

## 2019-03-08 MED ORDER — LORAZEPAM 2 MG/ML IJ SOLN
2.0000 mg | Freq: Once | INTRAMUSCULAR | Status: AC
  Administered 2019-03-08: 11:00:00 2 mg via INTRAMUSCULAR

## 2019-03-08 MED ORDER — CEFUROXIME AXETIL 500 MG PO TABS: 500.0000 mg | ORAL_TABLET | Freq: Two times a day (BID) | ORAL | 0 refills | Status: DC

## 2019-03-08 MED ORDER — LORAZEPAM 2 MG/ML IJ SOLN
INTRAMUSCULAR | Status: AC
  Administered 2019-03-08: 2 mg via INTRAMUSCULAR
  Filled 2019-03-08: qty 1

## 2019-03-08 MED ORDER — SODIUM CHLORIDE 0.9 % IV BOLUS: 1000.0000 mL | Freq: Once | INTRAVENOUS | Status: DC

## 2019-03-08 MED ORDER — HALOPERIDOL LACTATE 5 MG/ML IJ SOLN
5.0000 mg | Freq: Once | INTRAMUSCULAR | Status: AC
  Administered 2019-03-08: 5 mg via INTRAMUSCULAR
  Filled 2019-03-08: qty 1

## 2019-03-08 MED ORDER — HALOPERIDOL LACTATE 5 MG/ML IJ SOLN
5.0000 mg | Freq: Once | INTRAMUSCULAR | Status: DC
  Filled 2019-03-08: qty 1

## 2019-03-08 NOTE — ED Notes (Signed)
Pt unable to sign d/c signature

## 2019-03-08 NOTE — ED Provider Notes (Addendum)
[facility] Emergency Department Provider Note  ____________________________________________   First MD Initiated Contact with Patient 03/08/19 1013     (approximate)  I have reviewed the triage vital signs and the nursing notes.   HISTORY  Chief Complaint Anxiety    HPI Rebecca Bruce is a 58 y.o. female with thyroid disease who comes from [facility] group home.  Patient has been leaning to the left, frequently urinating and having some urinary incontinence.  Initially patient did not want to be transported to the ER.  Patient yelling that she wants to go home.   Caregivers are at bedside and says that she typically does this when she has to go into the hospital.  They already gave 0.5 of Ativan without any improvement about an hour ago. Unable to get full HPI due to patient yelling and due to her agitated state..   Per facility this has been going on for the past few days.    Past Medical History:  Diagnosis Date  . GERD (gastroesophageal reflux disease)   . Hypertension   . Thyroid disease     Patient Active Problem List   Diagnosis Date Noted  . 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 Hyperglycemia 07/13/2018  . Pre-diabetes 01/05/2017  . Other symptoms and signs involving cognitive functions and awareness 09/01/2015  . Dyslipidemia 07/02/2015  . History of hyperthyroidism 07/02/2015  . History of radioactive iodine thyroid ablation 07/02/2015  . Hypothyroidism (acquired) 07/02/2015  . Obesity 07/02/2015  . Hypertension, benign 07/02/2015  . Anxiety 07/02/2015  . Vitamin D deficiency 07/02/2015  . Insomnia 07/02/2015  . OA (osteoarthritis) of knee 07/02/2015  . GERD (gastroesophageal reflux disease) 07/02/2015  . Hyperlipidemia, unspecified 07/02/2015    Past Surgical History:  Procedure Laterality Date  . DENTAL SURGERY      Prior to Admission medications   Medication Sig Start Date End Date Taking? Authorizing  Provider  acetaminophen (TYLENOL) 500 MG tablet Take 1 tablet (500 mg total) by mouth every 6 (six) hours as needed. 02/05/19   Steele Sizer, MD  atorvastatin (LIPITOR) 20 MG tablet TAKE 1 TABLET BY MOUTH AT BEDTIME FOR CHOLESTEROL 11/01/18   Sowles, [name], MD  clindamycin (CLEOCIN T) 1 % lotion Apply topically daily. 02/05/19   Steele Sizer, MD  gabapentin (NEURONTIN) 300 MG capsule Take 1 capsule (300 mg total) by mouth at bedtime. 02/05/19   Steele Sizer, MD  hydrocortisone cream 0.5 % Apply 1 application topically 2 (two) times daily. 02/05/19   Steele Sizer, MD  levothyroxine (SYNTHROID) 100 MCG tablet Take 1 tablet (100 mcg total) by mouth daily before breakfast. 02/05/19   Ancil Boozer, [name], MD  LORazepam (ATIVAN) 0.5 MG tablet Take 0.5 mg by mouth daily as needed for anxiety.    Agapito Games, MD  mupirocin ointment (BACTROBAN) 2 % Apply topically. 12/20/16   [provider]  PARoxetine (PAXIL) 30 MG tablet TAKE 1 TABLET BY MOUTH AT BEDTIME FOR DEPRESSION 10/03/18   Steele Sizer, MD  Probiotic Product (PROBIOTIC-10 ULTIMATE) CAPS TAKE 1 CAPSULE BY MOUTH EVERY DAY. 12/31/18   Steele Sizer, MD  sodium chloride (OCEAN) 0.65 % SOLN nasal spray Place 1 spray into both nostrils as needed for congestion. Dc after one week Patient not taking: Reported on 01/30/2019 01/05/18   Steele Sizer, MD  triamcinolone cream (KENALOG) 0.1 % Apply 1 application topically 2 (two) times daily. For two weeks and stop, if no improvement return for follow up 03/13/17   Steele Sizer,  MD  triamterene-hydrochlorothiazide (MAXZIDE-25) 37.5-25 MG tablet TAKE 1/2 TABLET BY MOUTH EVERY DAY 11/01/18   Steele Sizer, MD  VIACTIV CALCIUM PLUS D 650-12.5-40 MG-MCG CHEW CHEW AND SWALLOW ONE PIECE TWICE DAILY. (SUPPLEMENT) CARAMEL 11/01/18   Steele Sizer, MD    Allergies Patient has no allergy information on record.  Family History  Problem Relation Age of Onset  . Heart disease Mother   .  Breast cancer Neg Hx     Social History Social History   Tobacco Use  . Smoking status: Never Smoker  . Smokeless tobacco: Never Used  . Tobacco comment: smoking cessation materials not required  Substance Use Topics  . Alcohol use: No    Alcohol/week: 0.0 standard drinks  . Drug use: No      Review of Systems Unable to get full review of systems due to patient being agitated. ____________________________________________   PHYSICAL EXAM:  VITAL SIGNS: Blood pressure (!) 160/111, pulse (!) 128, resp. rate 18, height 5\' 3"  (1.6 m), weight 90.7 kg, SpO2 94 %.  Constitutional: Alert x1.  Constantly yelling. Eyes: Conjunctivae are normal. EOMI. Head: Atraumatic. Nose: No congestion/rhinnorhea. Mouth/Throat: Mucous membranes are moist.   Neck: No stridor. Trachea Midline. FROM Cardiovascular: Tachycardic, regular rhythm. Grossly normal heart sounds.  Good peripheral circulation. Respiratory: Normal respiratory effort.  No retractions. Lungs CTAB. Gastrointestinal: Soft and nontender. No distention. No abdominal bruits.  Musculoskeletal: No lower extremity tenderness nor edema.  No joint effusions. Neurologic:  Normal speech and language.  Patient yelling.  Unable to follow commands due to her agitation..  May be slight leaning to the left with ambulation but otherwise fully walking around the room. Skin:  Skin is warm, dry and intact. No rash noted. Psychiatric: Agitated.  Yelling. GU: Deferred   ____________________________________________   LABS (all labs ordered are listed, but only abnormal results are displayed)  Labs Reviewed  CBC WITH DIFFERENTIAL/PLATELET - Abnormal; Notable for the following components:      Result Value   RBC 5.54 (*)    All other components within normal limits  COMPREHENSIVE METABOLIC PANEL - Abnormal; Notable for the following components:   Chloride 97 (*)    Glucose, Bld 131 (*)    All other components within normal limits  URINALYSIS,  ROUTINE W REFLEX MICROSCOPIC  TSH  T4, FREE   ____________________________________________   ED ECG REPORT I, Vanessa Buckholts, the attending physician, personally viewed and interpreted this ECG.  EKG: sinus tachcardia rate of 119, no st elevation, no twi, normal intervals.  ____________________________________________  RADIOLOGY   Official radiology report(s): Ct Head Wo Contrast  Result Date: 03/08/2019 CLINICAL DATA:  Altered level of consciousness.  Hypertension. EXAM: CT HEAD WITHOUT CONTRAST TECHNIQUE: Contiguous axial images were obtained from the base of the skull through the vertex without intravenous contrast. COMPARISON:  None. FINDINGS: Brain: The ventricles are normal in size and configuration. There is no intracranial mass, hemorrhage, extra-axial fluid collection, or midline shift. The brain parenchyma appears unremarkable. There is no evident acute infarct. Vascular: There is no hyperdense vessel. There is no appreciable vascular calcification. Skull: The bony calvarium appears intact. Sinuses/Orbits: There is mild mucosal thickening in several ethmoid air cells. Other visualized paranasal sinuses are clear. Visualized orbits appear symmetric bilaterally. Other: Visualized mastoid air cells are clear. IMPRESSION: Mild mucosal thickening in several ethmoid air cells. Study otherwise unremarkable. Electronically Signed   By: Lowella Grip III M.D.   On: 03/08/2019 14:07    ____________________________________________   PROCEDURES  Procedure(s) performed (including Critical Care):  Procedures   ____________________________________________   INITIAL IMPRESSION / ASSESSMENT AND PLAN / ED COURSE  Rebecca Bruce was evaluated in Emergency Department on 03/08/2019 for the symptoms described in the history of present illness. She was evaluated in the context of the global COVID-19 pandemic, which necessitated consideration that the patient might be at risk for  infection with the SARS-CoV-2 virus that causes COVID-19. Institutional protocols and algorithms that pertain to the evaluation of patients at risk for COVID-19 are in a state of rapid change based on information released by regulatory bodies including the CDC and federal and state organizations. These policies and algorithms were followed during the patient's care in the ED.    Patient is a 58 year old who comes in the group home due to leaning to the left with walking/increased urination.  Tachycardia and hypertension most likely from agitation.  Will get urine to evaluate for UTI basic labs to evaluate for electrolyte abnormalities, AKI, thyroid dysfunction.  Also get CT head to evaluate for intracranial hemorrhage or stroke.  Will need to discuss with POA for sedation given patient does not have a capacity to make any decisions.  Patient was given 2 mg of IM Ativan.  Initially patient calmed down but now she is to remain again.  POA is now at bedside and we discussed further work-up.  I offered POA sedation with some Haldol to facilitate further work-up and he was agreeable to this plan.  Will give 5 mg of IM Haldol.  12:09 PM reevaluated patient.  Patient is a little bit more calm with the Haldol.  She is able to squeeze with her bilateral hands although still not following commands great.  No obvious facial droop.  According to staff they noted her leaning to the right for the past 5 days.  Labs are reassuring.  1:56 PM CT scan is negative for stroke, intercranial hemorrhage.  I discussed with the POA I have low suspicion that this is secondary to stroke given I would most likely have  seen something now that she is been having the symptoms for a few days on the CT.  We discussed MRI and the benefits and risk of getting this.  Think would be very hard for patient to tolerate MRI given she hardly tolerated the CT scan we would have to more aggressively sedate her.  Given her reassuring neuro exam and  the fact that she is able to ambulate around the room and has no obvious deficits POA agreed with holding off on the MRI at this time.  We discussed a simple measures such as taking a baby aspirin daily to prevent strokes.  T4 is slightly elevated at 1.39.  Will have patient follow-up with her primary care doctor.  UA although slightly contaminated with squamous cells was concerning for UTI given the large leukocytes and WBCs and given patient's symptoms we will send culture and start treatment with antibiotics.  Updated POA on the above plan.  POA felt very comfortable with this.  Will discharge patient back to her facility   ____________________________________________   FINAL CLINICAL IMPRESSION(S) / ED DIAGNOSES   Final diagnoses:  Cystitis      MEDICATIONS GIVEN DURING THIS VISIT:  Medications  haloperidol lactate (HALDOL) injection 5 mg (has no administration in time range)  LORazepam (ATIVAN) injection 2 mg (2 mg Intramuscular Given 03/08/19 1030)  haloperidol lactate (HALDOL) injection 5 mg (5 mg Intramuscular Given 03/08/19 1140)  ED Discharge Orders         Ordered    cefUROXime (CEFTIN) 500 MG tablet  2 times daily     03/08/19 1525           Note:  This document was prepared using Dragon voice recognition software and may include unintentional dictation errors.   Vanessa [place], MD 03/08/19 1532    Vanessa Chapman, MD 03/08/19 1538

## 2019-03-08 NOTE — ED Triage Notes (Signed)
Pt via POV from [facility]. Pt is here with two caregivers that work at the group home. Caregivers came here due to pt have insomnia for two days, leaning on her left side, and HTN.   When pt arrived pt was yelling and dropping to the floor stating "I want to go home" and screaming.

## 2019-03-08 NOTE — Discharge Instructions (Addendum)
°  Urine is concerning for UTI. We have sent for culture and starting antibiotics. Follow up in 2 days with PCP to make sure sensitive to treatment.  CT head was negative for stroke. If she develops weakness on one side, slurred speech or any other new concern she should be re-evaluated. Can take aspirin daily to prevent stroke.   T4 is 1.39 will need follow up with primary doctor.   IMPRESSION: Mild mucosal thickening in several ethmoid air cells. Study otherwise unremarkable.

## 2019-03-08 NOTE — Telephone Encounter (Signed)
Spoke to Toya Smothers at [facility]. Caregiver called for appointment and stated that patient has had elevated BP, leaning to the left side, frequent urinating and going on herself. Group Home has called EMS several times and patient would not go to hospital. Spoke to [place] NP. Patient need to go to ER. [name] was informed

## 2019-03-08 NOTE — ED Notes (Signed)
Attempted to get blood work and urine sample. This RN entered the room and pt was asleep. As soon as this RN attempted to take off BP cuff. Pt is screaming and trying to come out of the bed. 2 Caregivers and the pt's POA is at bedside.

## 2019-03-08 NOTE — Telephone Encounter (Signed)
Agree with disposition - advised must go to ER ASAP - likely sign and symptoms of stroke.

## 2019-03-09 ENCOUNTER — Encounter: Payer: Self-pay | Admitting: Emergency Medicine

## 2019-03-09 ENCOUNTER — Emergency Department: Payer: Medicare Other

## 2019-03-09 ENCOUNTER — Inpatient Hospital Stay
Admission: EM | Admit: 2019-03-09 | Discharge: 2019-03-11 | DRG: 871 | Disposition: A | Discharge status: Other Acute Inpatient Hospital | Payer: Medicare Other | Attending: Internal Medicine | Admitting: Internal Medicine

## 2019-03-09 DIAGNOSIS — Z01818 Encounter for other preprocedural examination: Secondary | ICD-10-CM | POA: Diagnosis not present

## 2019-03-09 DIAGNOSIS — F419 Anxiety disorder, unspecified: Secondary | ICD-10-CM | POA: Diagnosis present

## 2019-03-09 DIAGNOSIS — N390 Urinary tract infection, site not specified: Secondary | ICD-10-CM

## 2019-03-09 DIAGNOSIS — Z79890 Hormone replacement therapy (postmenopausal): Secondary | ICD-10-CM | POA: Diagnosis not present

## 2019-03-09 DIAGNOSIS — M1710 Unilateral primary osteoarthritis, unspecified knee: Secondary | ICD-10-CM | POA: Diagnosis present

## 2019-03-09 DIAGNOSIS — A419 Sepsis, unspecified organism: Secondary | ICD-10-CM | POA: Diagnosis not present

## 2019-03-09 DIAGNOSIS — D62 Acute posthemorrhagic anemia: Secondary | ICD-10-CM | POA: Diagnosis not present

## 2019-03-09 DIAGNOSIS — E039 Hypothyroidism, unspecified: Secondary | ICD-10-CM | POA: Diagnosis not present

## 2019-03-09 DIAGNOSIS — U071 COVID-19: Secondary | ICD-10-CM | POA: Diagnosis not present

## 2019-03-09 DIAGNOSIS — R404 Transient alteration of awareness: Secondary | ICD-10-CM | POA: Diagnosis not present

## 2019-03-09 DIAGNOSIS — M179 Osteoarthritis of knee, unspecified: Secondary | ICD-10-CM | POA: Diagnosis present

## 2019-03-09 DIAGNOSIS — K219 Gastro-esophageal reflux disease without esophagitis: Secondary | ICD-10-CM | POA: Diagnosis present

## 2019-03-09 DIAGNOSIS — G629 Polyneuropathy, unspecified: Secondary | ICD-10-CM | POA: Diagnosis present

## 2019-03-09 DIAGNOSIS — Z79899 Other long term (current) drug therapy: Secondary | ICD-10-CM | POA: Diagnosis not present

## 2019-03-09 DIAGNOSIS — R578 Other shock: Secondary | ICD-10-CM | POA: Diagnosis not present

## 2019-03-09 DIAGNOSIS — Z8249 Family history of ischemic heart disease and other diseases of the circulatory system: Secondary | ICD-10-CM | POA: Diagnosis not present

## 2019-03-09 DIAGNOSIS — F70 Mild intellectual disabilities: Secondary | ICD-10-CM | POA: Diagnosis present

## 2019-03-09 DIAGNOSIS — F061 Catatonic disorder due to known physiological condition: Secondary | ICD-10-CM | POA: Diagnosis not present

## 2019-03-09 DIAGNOSIS — R05 Cough: Secondary | ICD-10-CM | POA: Diagnosis not present

## 2019-03-09 DIAGNOSIS — R918 Other nonspecific abnormal finding of lung field: Secondary | ICD-10-CM | POA: Diagnosis not present

## 2019-03-09 DIAGNOSIS — R4182 Altered mental status, unspecified: Secondary | ICD-10-CM | POA: Diagnosis not present

## 2019-03-09 DIAGNOSIS — J1289 Other viral pneumonia: Secondary | ICD-10-CM | POA: Diagnosis not present

## 2019-03-09 DIAGNOSIS — Z9911 Dependence on respirator [ventilator] status: Secondary | ICD-10-CM | POA: Diagnosis not present

## 2019-03-09 DIAGNOSIS — J9601 Acute respiratory failure with hypoxia: Secondary | ICD-10-CM | POA: Diagnosis not present

## 2019-03-09 DIAGNOSIS — E785 Hyperlipidemia, unspecified: Secondary | ICD-10-CM | POA: Diagnosis present

## 2019-03-09 DIAGNOSIS — N3000 Acute cystitis without hematuria: Secondary | ICD-10-CM | POA: Diagnosis not present

## 2019-03-09 DIAGNOSIS — G9341 Metabolic encephalopathy: Secondary | ICD-10-CM | POA: Diagnosis not present

## 2019-03-09 DIAGNOSIS — I10 Essential (primary) hypertension: Secondary | ICD-10-CM | POA: Diagnosis not present

## 2019-03-09 DIAGNOSIS — R059 Cough, unspecified: Secondary | ICD-10-CM

## 2019-03-09 LAB — CBC WITH DIFFERENTIAL/PLATELET
Abs Immature Granulocytes: 0.01 10*3/uL (ref 0.00–0.07)
Basophils Absolute: 0 10*3/uL (ref 0.0–0.1)
Basophils Relative: 0 %
Eosinophils Absolute: 0 10*3/uL (ref 0.0–0.5)
Eosinophils Relative: 0 %
HCT: 44.3 % (ref 36.0–46.0)
Hemoglobin: 14.9 g/dL (ref 12.0–15.0)
Immature Granulocytes: 0 %
Lymphocytes Relative: 12 %
Lymphs Abs: 0.8 10*3/uL (ref 0.7–4.0)
MCH: 26.2 pg (ref 26.0–34.0)
MCHC: 33.6 g/dL (ref 30.0–36.0)
MCV: 78 fL — ABNORMAL LOW (ref 80.0–100.0)
Monocytes Absolute: 0.7 10*3/uL (ref 0.1–1.0)
Monocytes Relative: 11 %
Neutro Abs: 4.9 10*3/uL (ref 1.7–7.7)
Neutrophils Relative %: 77 %
Platelets: 223 10*3/uL (ref 150–400)
RBC: 5.68 MIL/uL — ABNORMAL HIGH (ref 3.87–5.11)
RDW: 13.9 % (ref 11.5–15.5)
WBC: 6.4 10*3/uL (ref 4.0–10.5)
nRBC: 0 % (ref 0.0–0.2)

## 2019-03-09 LAB — BASIC METABOLIC PANEL
Anion gap: 12 (ref 5–15)
BUN: 9 mg/dL (ref 6–20)
CO2: 26 mmol/L (ref 22–32)
Calcium: 8.8 mg/dL — ABNORMAL LOW (ref 8.9–10.3)
Chloride: 98 mmol/L (ref 98–111)
Creatinine, Ser: 0.68 mg/dL (ref 0.44–1.00)
GFR calc Af Amer: >60 mL/min (ref >60)
GFR calc non Af Amer: >60 mL/min (ref >60)
Glucose, Bld: 117 mg/dL — ABNORMAL HIGH (ref 70–99)
Potassium: 4.1 mmol/L (ref 3.5–5.1)
Sodium: 136 mmol/L (ref 135–145)

## 2019-03-09 LAB — TSH: TSH: 3.243 u[IU]/mL (ref 0.350–4.500)

## 2019-03-09 LAB — GLUCOSE, CAPILLARY
Glucose-Capillary: 127 mg/dL — ABNORMAL HIGH (ref 70–99)
Glucose-Capillary: 276 mg/dL — ABNORMAL HIGH (ref 70–99)

## 2019-03-09 LAB — HEMOGLOBIN A1C
Hgb A1c MFr Bld: 6.1 % — ABNORMAL HIGH (ref 4.8–5.6)
Mean Plasma Glucose: 128.37 mg/dL

## 2019-03-09 LAB — AMMONIA: Ammonia: 10 umol/L (ref 9–35)

## 2019-03-09 LAB — LACTIC ACID, PLASMA
Lactic Acid, Venous: 2.4 mmol/L (ref 0.5–1.9)
Lactic Acid, Venous: 1.3 mmol/L (ref 0.5–1.9)

## 2019-03-09 IMAGING — CT CT HEAD W/O CM
4 series · 15 of 47 positions shown, 17 images · non-contrast
Comparison: None.

CLINICAL DATA: Altered mental status

EXAM:
CT HEAD WITHOUT CONTRAST
TECHNIQUE: Contiguous axial images were obtained from the base of the skull
through the vertex without intravenous contrast.

[Series 2: head bone · axial · 0.41mm/px · z∈[+604,+618]mm · 2 of 69 slices shown]
[im 7/69  bone]
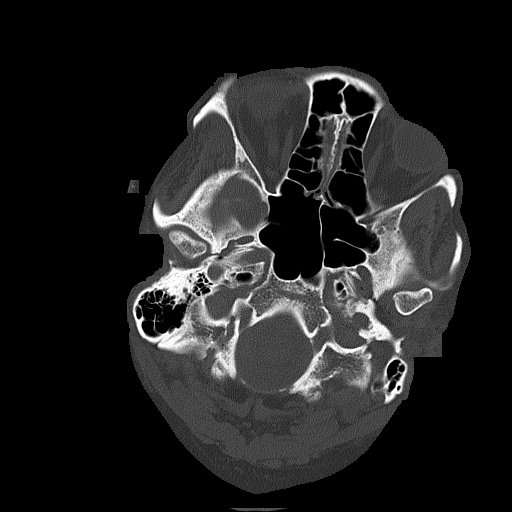
[im 14/69  bone]
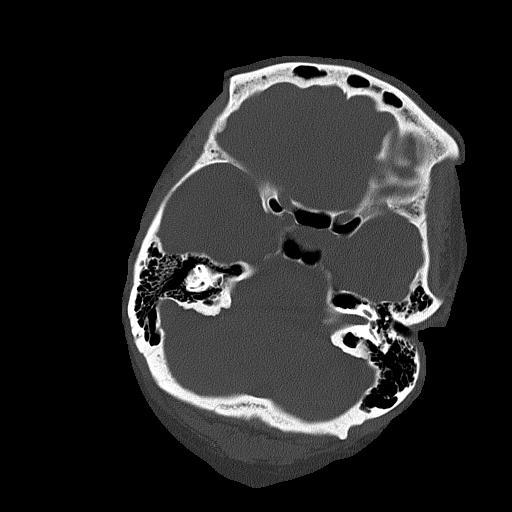

[Series 3: ax head wo · axial · 0.31mm/px · z∈[+567,+680]mm · 7 of 31 slices shown, 9 images]
[im 4/31  brain]
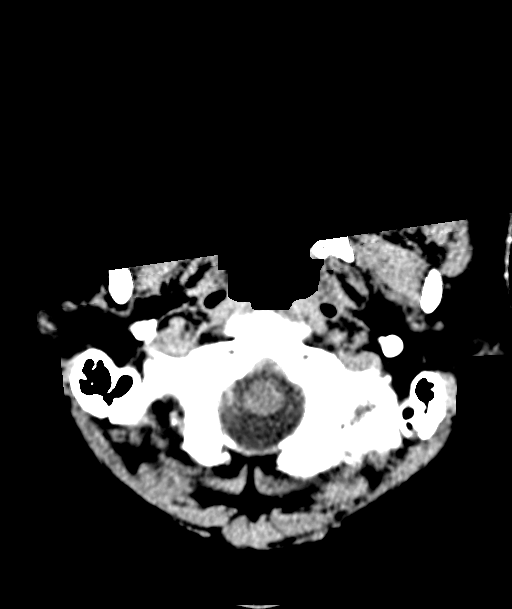
[im 4/31  bone]
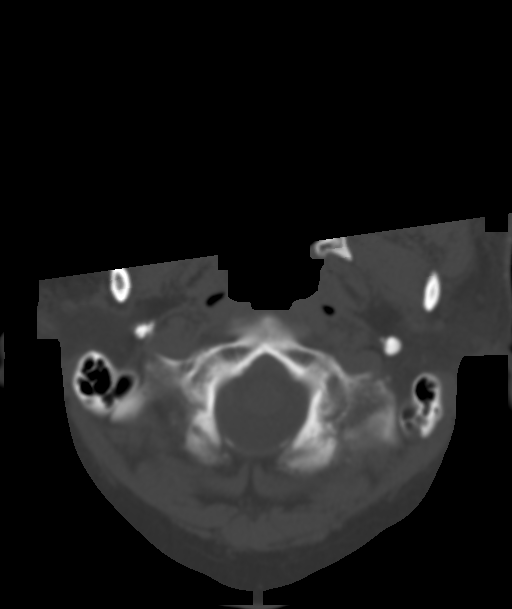
[im 8/31  brain]
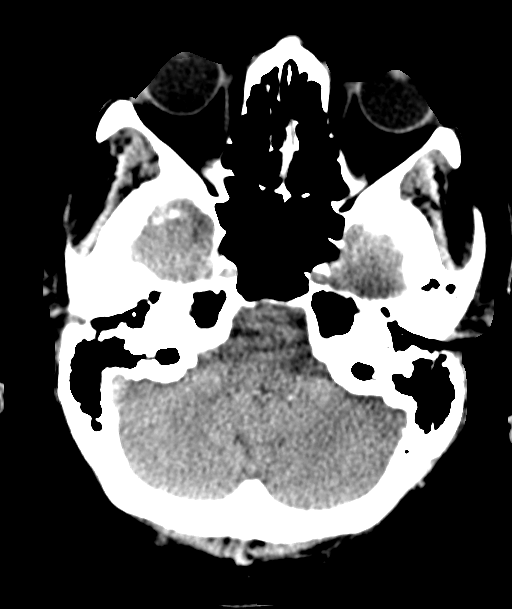
[im 12/31  brain]
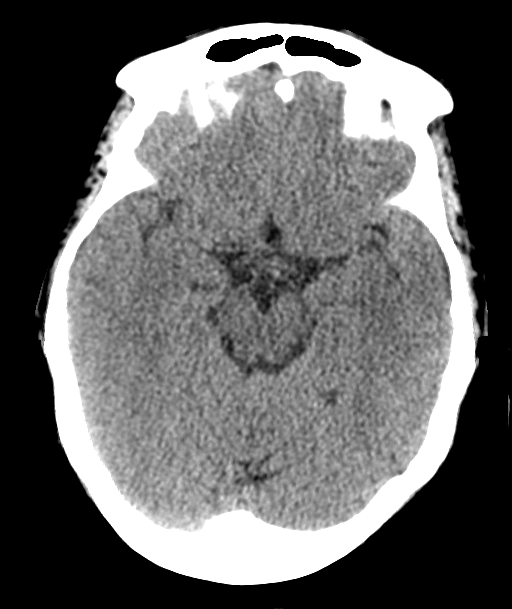
[im 16/31  brain]
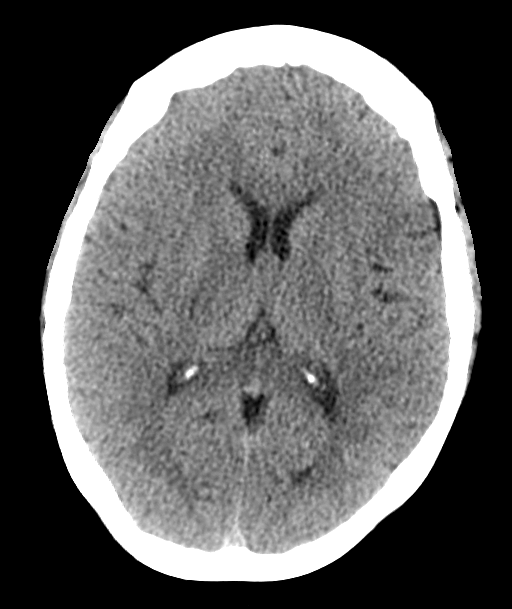
[im 19/31  brain]
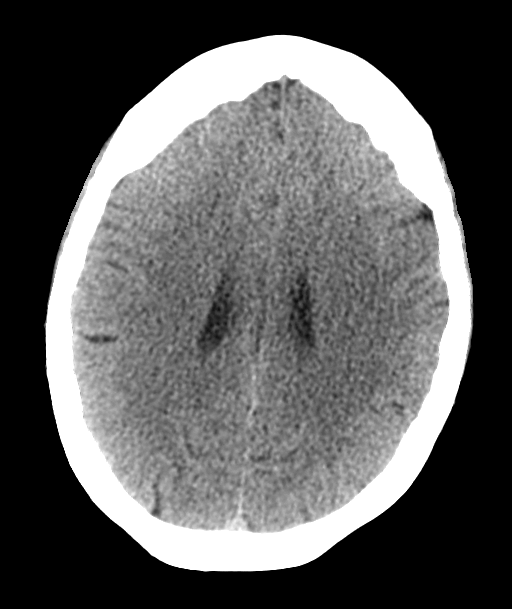
[im 19/31  bone]
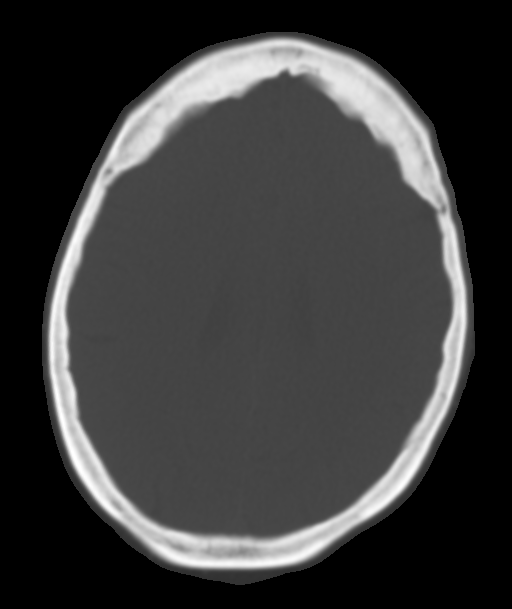
[im 23/31  brain]
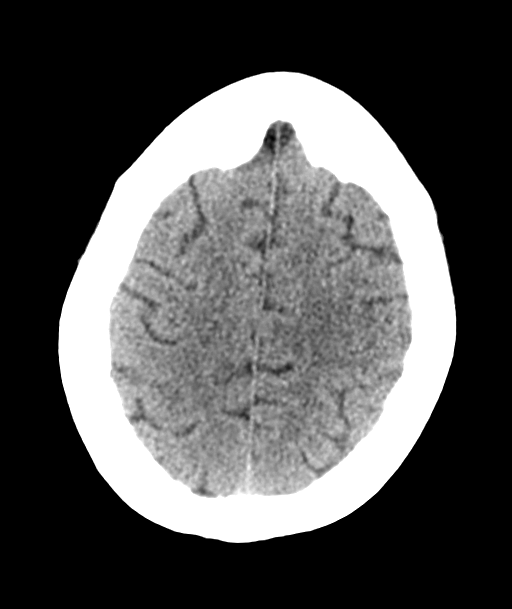
[im 27/31  brain]
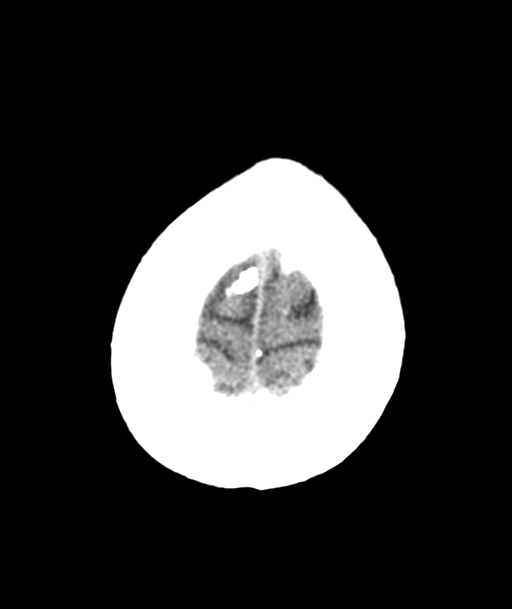

[Series 5: coronal soft tissue · coronal · 0.29mm/px · 3 of 61 slices shown]
[im 21/61  brain]
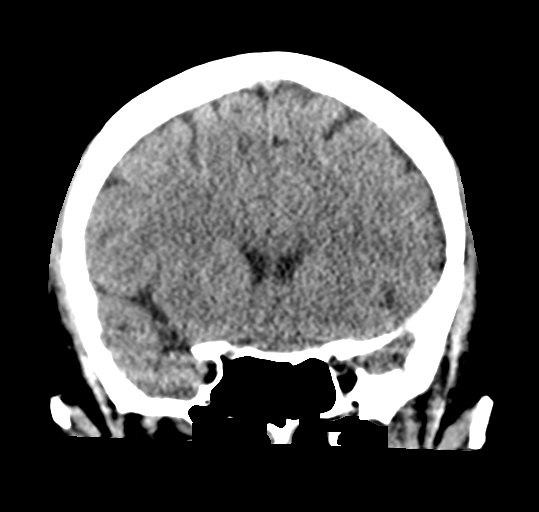
[im 27/61  brain]
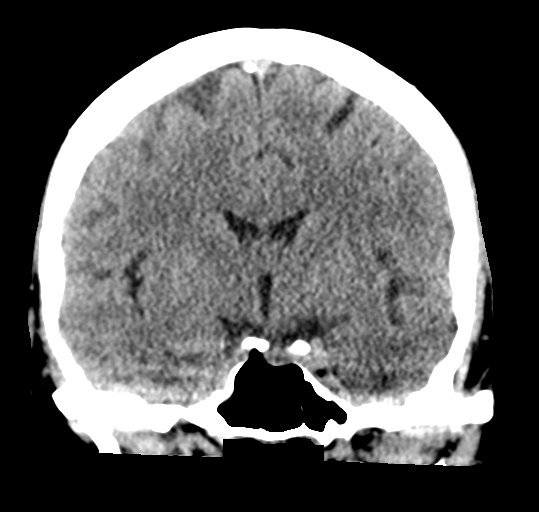
[im 34/61  brain]
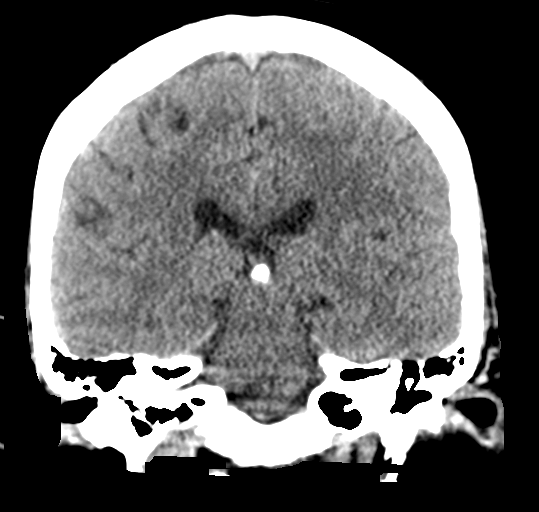

[Series 6: sagittal soft tissue · sagittal · 0.29mm/px · 3 of 52 slices shown]
[im 18/52  brain]
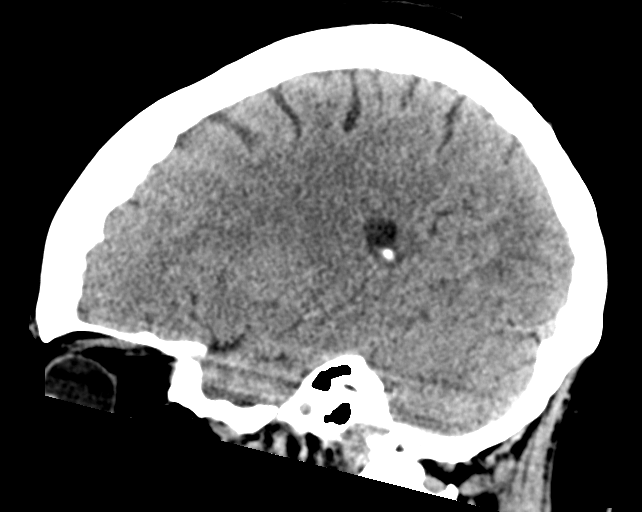
[im 26/52  brain]
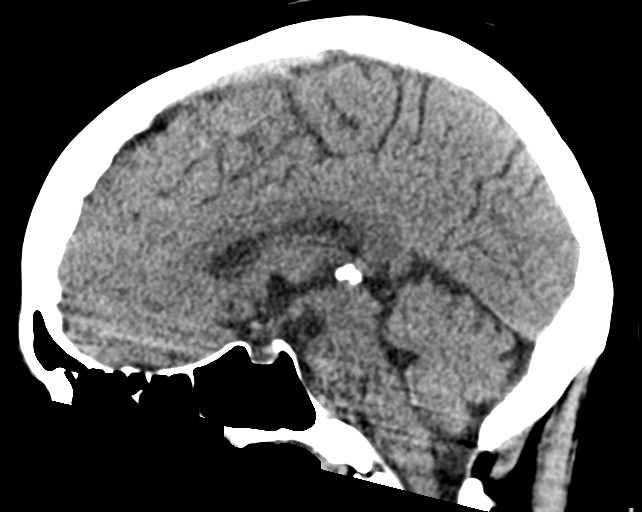
[im 35/52  brain]
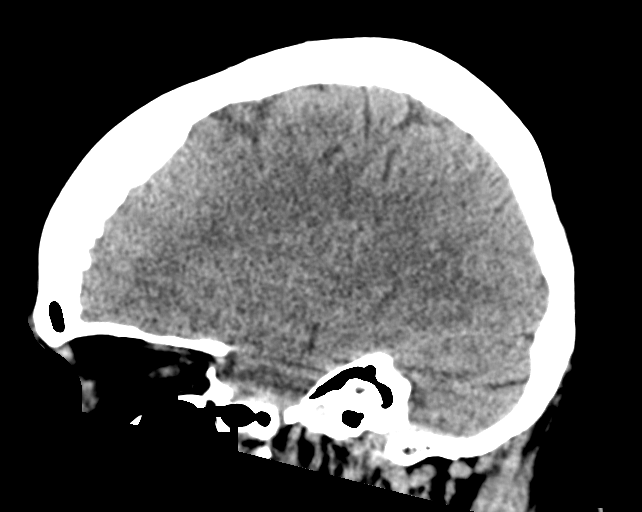

[15 of 47 positions shown; findings below may reference images not displayed]

FINDINGS: Brain: The ventricles are normal in size and configuration. There is
no intracranial mass, hemorrhage, extra-axial fluid collection, or
midline shift. The brain parenchyma appears unremarkable. No acute
infarct evident.

Vascular: No hyperdense vessel. There is no appreciable vascular
calcification.

Skull: The bony calvarium appears intact.

Sinuses/Orbits: There is mucosal thickening in several ethmoid air
cells. Other visualized paranasal sinuses are clear. Visualized
orbits appear symmetric bilaterally.

Other: Mastoid air cells are clear.
IMPRESSION: Mucosal thickening in several ethmoid air cells. Study otherwise
unremarkable.

## 2019-03-09 MED ORDER — ACETAMINOPHEN 650 MG RE SUPP
650.0000 mg | Freq: Four times a day (QID) | RECTAL | Status: DC | PRN
  Administered 2019-03-09 – 2019-03-10 (×2): 650 mg via RECTAL
  Filled 2019-03-09 (×3): qty 1

## 2019-03-09 MED ORDER — SODIUM CHLORIDE 0.9 % IV SOLN
1.0000 g | Freq: Three times a day (TID) | INTRAVENOUS | Status: DC
  Administered 2019-03-09 – 2019-03-11 (×7): 1 g via INTRAVENOUS
  Filled 2019-03-09 (×10): qty 1

## 2019-03-09 MED ORDER — SODIUM CHLORIDE 0.9 % IV SOLN
1.0000 g | Freq: Once | INTRAVENOUS | Status: AC
  Administered 2019-03-09: 1 g via INTRAVENOUS
  Filled 2019-03-09: qty 10

## 2019-03-09 MED ORDER — DEXTROSE-NACL 5-0.45 % IV SOLN
INTRAVENOUS | Status: AC
  Administered 2019-03-09 – 2019-03-10 (×2): via INTRAVENOUS

## 2019-03-09 MED ORDER — LORAZEPAM 2 MG/ML IJ SOLN
2.0000 mg | Freq: Once | INTRAMUSCULAR | Status: AC
  Administered 2019-03-09: 2 mg via INTRAMUSCULAR
  Filled 2019-03-09: qty 1

## 2019-03-09 MED ORDER — VANCOMYCIN HCL 10 G IV SOLR
1750.0000 mg | INTRAVENOUS | Status: DC
  Administered 2019-03-10 – 2019-03-11 (×2): 1750 mg via INTRAVENOUS
  Filled 2019-03-09 (×2): qty 1750

## 2019-03-09 MED ORDER — ACETAMINOPHEN 325 MG PO TABS: 650.0000 mg | ORAL_TABLET | Freq: Four times a day (QID) | ORAL | Status: DC | PRN

## 2019-03-09 MED ORDER — LORAZEPAM 2 MG/ML IJ SOLN
1.0000 mg | Freq: Once | INTRAMUSCULAR | Status: AC
  Administered 2019-03-09: 1 mg via INTRAVENOUS
  Filled 2019-03-09: qty 1

## 2019-03-09 MED ORDER — INSULIN ASPART 100 UNIT/ML ~~LOC~~ SOLN
0.0000 [IU] | Freq: Three times a day (TID) | SUBCUTANEOUS | Status: DC
  Administered 2019-03-11: 1 [IU] via SUBCUTANEOUS
  Filled 2019-03-09 (×2): qty 1

## 2019-03-09 MED ORDER — ENOXAPARIN SODIUM 40 MG/0.4ML ~~LOC~~ SOLN: 40.0000 mg | SUBCUTANEOUS | Status: DC

## 2019-03-09 MED ORDER — VANCOMYCIN HCL 10 G IV SOLR
2000.0000 mg | Freq: Once | INTRAVENOUS | Status: AC
  Administered 2019-03-09: 2000 mg via INTRAVENOUS
  Filled 2019-03-09: qty 2000

## 2019-03-09 MED ORDER — SENNOSIDES-DOCUSATE SODIUM 8.6-50 MG PO TABS: 1.0000 | ORAL_TABLET | Freq: Every evening | ORAL | Status: DC | PRN

## 2019-03-09 MED ORDER — HALOPERIDOL LACTATE 5 MG/ML IJ SOLN
5.0000 mg | Freq: Once | INTRAMUSCULAR | Status: AC
  Administered 2019-03-09: 5 mg via INTRAMUSCULAR
  Filled 2019-03-09: qty 1

## 2019-03-09 NOTE — Consult Note (Signed)
Pharmacy Antibiotic Note  Rebecca Bruce is a 58 y.o. female admitted on 03/09/2019 with sepsis and UTI.  Pharmacy has been consulted for Vancomycin and Meropenem dosing.  Plan: 1) Patient will receive Vancomycin 2g Loading Dose in ED. Maintenance: Vancomycin 1750 mg IV Q 24 hrs. Goal AUC 400-550. Expected AUC: 484.3 Expected Css: 10.8 SCr used: 0.8(actual 0.68)  2) Meropenem 1g Q8 hours  Height: 5\' 2"  (157.5 cm) IBW/kg (Calculated) : 50.1  Temp (24hrs), Avg:98.2 F (36.8 C), Min:98 F (36.7 C), Max:98.3 F (36.8 C)  Recent Labs  Lab 03/08/19 1252 03/09/19 1215 03/09/19 1324 03/09/19 1517  WBC 6.8 6.4  --   --   CREATININE 0.89  --  0.68  --   LATICACIDVEN  --   --   --  2.4*    Estimated Creatinine Clearance: 81.2 mL/min (by C-G formula based on SCr of 0.68 mg/dL).    No Known Allergies  Antimicrobials this admission: Vancomycin 11/28 >>  Meropenem 11/28 >>  Ceftriaxone 11/28 x 1   Microbiology results: 11/28 BCx: pending 11/28 UCx: pending    Thank you for allowing pharmacy to be a part of this patient's care.  Rebecca Bruce 03/09/2019 3:56 PM

## 2019-03-09 NOTE — Progress Notes (Signed)
Erroneous encounter

## 2019-03-09 NOTE — ED Notes (Signed)
Pt sleeping at this time. Brother remains at bedside.

## 2019-03-09 NOTE — ED Notes (Signed)
This RN discussed with caregiver plan of care and caregiver will relay to brother (legal Guardian) that patient will more than likely be staying in the ED for this evening. Caregiver Demetris also making contact with facility about making arrangments for pt.

## 2019-03-09 NOTE — ED Notes (Signed)
Pt continues to rip off her vitals. Will reeval her vitals once pt is asleep.

## 2019-03-09 NOTE — Progress Notes (Signed)
Notified bedside nurse of need to draw lactic acid.  

## 2019-03-09 NOTE — ED Notes (Signed)
Health dept called group home supervisor Gwyndolyn Saxon [phone]) tonight and reported that pt covid test by them was positive. MD and charge RN notified.

## 2019-03-09 NOTE — ED Notes (Signed)
Pt to get IV and bloodwork after medication has taken effect. Brother at bedside still and understands the plan.

## 2019-03-09 NOTE — Progress Notes (Signed)
CODE SEPSIS - PHARMACY COMMUNICATION  **Broad Spectrum Antibiotics should be administered within 1 hour of Sepsis diagnosis**  Time Code Sepsis Called/Page Received: 1423  Antibiotics Ordered: Rocephin(x1), Vancomycin,Meropenem  Time of 1st antibiotic administration: [id number]  Additional action taken by pharmacy: none  If necessary, Name of Provider/Nurse Contacted: n/a    [name] ,PharmD Clinical Pharmacist  03/09/2019  3:01 PM

## 2019-03-09 NOTE — ED Notes (Signed)
Dr. Reesa Chew messaged about critical lactic of 2.4

## 2019-03-09 NOTE — Progress Notes (Signed)
Notified bedside nurse of need to draw repeat lactic acid. 

## 2019-03-09 NOTE — ED Notes (Signed)
Pt not dressed out due to remaining a medical pt per Dr. Jari Pigg.

## 2019-03-09 NOTE — ED Notes (Signed)
Pt screaming "I want to go home" over and over. Pt tolerates things being done such as BP and bladder scan but yells the whole time. Group home will not answer phone. Brother in lobby and will be sent back.

## 2019-03-09 NOTE — ED Notes (Signed)
Brother leaving bedside at this time. Pt resting quietly.

## 2019-03-09 NOTE — ED Provider Notes (Addendum)
[facility] Emergency Department Provider Note  ____________________________________________   First MD Initiated Contact with Patient 03/09/19 0900     (approximate)  I have reviewed the triage vital signs and the nursing notes.   HISTORY  Chief Complaint Altered Mental Status    HPI Rebecca Bruce is a 59 y.o. female with intellectual disabilities who presents from [facility] due to altered mental status.  Per triage note patient was brought in for altered mental status and not urinating since returning from the hospital.  Patient was seen by myself yesterday at baseline she is combative difficult to get a history from due to her intellectual disability.  She was combative with the nurses were trying to get a bladder scan.  With me she is looking around the room and not as combative but seems alert.  Will discuss with the brother to get some collateral information.  We attempted to contact her group home and they did not answer.  Unable to get full HPI due to patient's intellectual disability   Past Medical History:  Diagnosis Date  . GERD (gastroesophageal reflux disease)   . Hypertension   . Thyroid disease     Patient Active Problem List   Diagnosis Date Noted  . 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 Hyperglycemia 07/13/2018  . Pre-diabetes 01/05/2017  . Other symptoms and signs involving cognitive functions and awareness 09/01/2015  . Dyslipidemia 07/02/2015  . History of hyperthyroidism 07/02/2015  . History of radioactive iodine thyroid ablation 07/02/2015  . Hypothyroidism (acquired) 07/02/2015  . Obesity 07/02/2015  . Hypertension, benign 07/02/2015  . Anxiety 07/02/2015  . Vitamin D deficiency 07/02/2015  . Insomnia 07/02/2015  . OA (osteoarthritis) of knee 07/02/2015  . GERD (gastroesophageal reflux disease) 07/02/2015  . Hyperlipidemia, unspecified 07/02/2015    Past Surgical History:  Procedure  Laterality Date  . DENTAL SURGERY      Prior to Admission medications   Medication Sig Start Date End Date Taking? Authorizing Provider  acetaminophen (TYLENOL) 500 MG tablet Take 1 tablet (500 mg total) by mouth every 6 (six) hours as needed. 02/05/19   Steele Sizer, MD  atorvastatin (LIPITOR) 20 MG tablet TAKE 1 TABLET BY MOUTH AT BEDTIME FOR CHOLESTEROL 11/01/18   Steele Sizer, MD  cefUROXime (CEFTIN) 500 MG tablet Take 1 tablet (500 mg total) by mouth 2 (two) times daily for 7 days. 03/08/19 03/15/19  Vanessa Linton, MD  clindamycin (CLEOCIN T) 1 % lotion Apply topically daily. 02/05/19   Steele Sizer, MD  gabapentin (NEURONTIN) 300 MG capsule Take 1 capsule (300 mg total) by mouth at bedtime. 02/05/19   Steele Sizer, MD  hydrocortisone cream 0.5 % Apply 1 application topically 2 (two) times daily. 02/05/19   Steele Sizer, MD  levothyroxine (SYNTHROID) 100 MCG tablet Take 1 tablet (100 mcg total) by mouth daily before breakfast. 02/05/19   Ancil Boozer, [name], MD  LORazepam (ATIVAN) 0.5 MG tablet Take 0.5 mg by mouth daily as needed for anxiety.    Agapito Games, MD  mupirocin ointment (BACTROBAN) 2 % Apply topically. 12/20/16   [provider]  PARoxetine (PAXIL) 30 MG tablet TAKE 1 TABLET BY MOUTH AT BEDTIME FOR DEPRESSION 10/03/18   Steele Sizer, MD  Probiotic Product (PROBIOTIC-10 ULTIMATE) CAPS TAKE 1 CAPSULE BY MOUTH EVERY DAY. 12/31/18   Steele Sizer, MD  sodium chloride (OCEAN) 0.65 % SOLN nasal spray Place 1 spray into both nostrils as needed for congestion. Dc after one week Patient  not taking: Reported on 01/30/2019 01/05/18   Steele Sizer, MD  triamcinolone cream (KENALOG) 0.1 % Apply 1 application topically 2 (two) times daily. For two weeks and stop, if no improvement return for follow up 03/13/17   Steele Sizer, MD  triamterene-hydrochlorothiazide [facility]) 37.5-25 MG tablet TAKE 1/2 TABLET BY MOUTH EVERY DAY 11/01/18   Steele Sizer, MD  VIACTIV  CALCIUM PLUS D 650-12.5-40 MG-MCG CHEW CHEW AND SWALLOW ONE PIECE TWICE DAILY. (SUPPLEMENT) CARAMEL 11/01/18   Steele Sizer, MD    Allergies Patient has no known allergies.  Family History  Problem Relation Age of Onset  . Heart disease Mother   . Breast cancer Neg Hx     Social History Social History   Tobacco Use  . Smoking status: Never Smoker  . Smokeless tobacco: Never Used  . Tobacco comment: smoking cessation materials not required  Substance Use Topics  . Alcohol use: No    Alcohol/week: 0.0 standard drinks  . Drug use: No      Review of Systems Unable to get full review of systems due to patient's intellectual disability. ____________________________________________   PHYSICAL EXAM:  VITAL SIGNS: Blood pressure (!) 159/100, pulse (!) 112, temperature 98.3 F (36.8 C), resp. rate (!) 43, height 5\' 2"  (1.575 m), SpO2 94 %.  Constitutional: Alert x1.  looking around the room Eyes: Conjunctivae are normal. EOMI. Head: Atraumatic. Nose: No congestion/rhinnorhea. Mouth/Throat: Mucous membranes are moist.   Neck: No stridor. Trachea Midline. FROM Cardiovascular: Tachycardic, regular rhythm. Grossly normal heart sounds.  Good peripheral circulation. Respiratory: Normal respiratory effort.  No retractions. Lungs CTAB. Gastrointestinal: Soft and nontender. No distention. No abdominal bruits.  Musculoskeletal: No lower extremity tenderness nor edema.  No joint effusions. Neurologic:  Unable to follow commands due to intellectual disability.  Moving arms.  Skin:  Skin is warm, dry and intact. No rash noted. Psychiatric: Agitated.  Yelling. GU: Deferred   ____________________________________________   LABS (all labs ordered are listed, but only abnormal results are displayed)  Labs Reviewed  CBC WITH DIFFERENTIAL/PLATELET - Abnormal; Notable for the following components:      Result Value   RBC 5.68 (*)    MCV 78.0 (*)    All other components within normal  limits  BASIC METABOLIC PANEL - Abnormal; Notable for the following components:   Glucose, Bld 117 (*)    Calcium 8.8 (*)    All other components within normal limits  LACTIC ACID, PLASMA - Abnormal; Notable for the following components:   Lactic Acid, Venous 2.4 (*)    All other components within normal limits  GLUCOSE, CAPILLARY - Abnormal; Notable for the following components:   Glucose-Capillary 127 (*)    All other components within normal limits  SARS CORONAVIRUS 2 (TAT 6-24 HRS)  CULTURE, BLOOD (ROUTINE X 2)  CULTURE, BLOOD (ROUTINE X 2)  AMMONIA  TSH  HIV ANTIBODY (ROUTINE TESTING W REFLEX)  HEMOGLOBIN A1C  LACTIC ACID, PLASMA  CBC  COMPREHENSIVE METABOLIC PANEL   ____________________________________________   ED ECG REPORT I, Vanessa Fountain, the attending physician, personally viewed and interpreted this ECG.   EKG sinus tachycardia rate of 120, no ST elevations, no T wave inversions, normal intervals ____________________________________________  RADIOLOGY   Official radiology report(s): Ct Head Wo Contrast  Result Date: 03/08/2019 CLINICAL DATA:  Altered level of consciousness.  Hypertension. EXAM: CT HEAD WITHOUT CONTRAST TECHNIQUE: Contiguous axial images were obtained from the base of the skull through the vertex without intravenous contrast. COMPARISON:  None. FINDINGS: Brain: The ventricles are normal in size and configuration. There is no intracranial mass, hemorrhage, extra-axial fluid collection, or midline shift. The brain parenchyma appears unremarkable. There is no evident acute infarct. Vascular: There is no hyperdense vessel. There is no appreciable vascular calcification. Skull: The bony calvarium appears intact. Sinuses/Orbits: There is mild mucosal thickening in several ethmoid air cells. Other visualized paranasal sinuses are clear. Visualized orbits appear symmetric bilaterally. Other: Visualized mastoid air cells are clear. IMPRESSION: Mild mucosal  thickening in several ethmoid air cells. Study otherwise unremarkable. Electronically Signed   By: Lowella Grip III M.D.   On: 03/08/2019 14:07    ____________________________________________   PROCEDURES  Procedure(s) performed (including Critical Care):  Procedures   ____________________________________________   INITIAL IMPRESSION / ASSESSMENT AND PLAN / ED COURSE  Rebecca Bruce was evaluated in Emergency Department on 03/09/2019 for the symptoms described in the history of present illness. She was evaluated in the context of the global COVID-19 pandemic, which necessitated consideration that the patient might be at risk for infection with the SARS-CoV-2 virus that causes COVID-19. Institutional protocols and algorithms that pertain to the evaluation of patients at risk for COVID-19 are in a state of rapid change based on information released by regulatory bodies including the CDC and federal and state organizations. These policies and algorithms were followed during the patient's care in the ED.    Unclear exactly why patient was brought back to the ER.  After multiple times we are able to get a hold of the group home who says that she had not urinated since last night.  There is also concerned with her ability to walk where they state that she has not walked for 3 days.  However I saw patient yesterday in the ER patient was able to walk at that time.  Patient was able to ambulate with assistance with the nurses.  The brother who is the POA says this is not baseline for her normal she has a normal wide gait.  He also says that baseline she is able to have a conversation and that has been change for the past 1 week.  Will get repeat CT head to ensure there is no progression of any signs of stroke, bleed.  Consider this being secondary to the UTI previously treated and so will get IV and give a dose of ceftriaxone.  Will repeat labs to evaluate for electrolyte abnormalities, AKI.   Tachycardia more likely from agitation.   Bladder scan with about 300 cc.  Not a significant amount of urine and I do not think patient would tolerate a indwelling Foley therefore will hold off on catheterization at this time and see if patient can urinate on her own.  Pt given haldol/ativan to sedate.  Still moving around and unable to get CT.  Given 1mg  ativan and now able to tolerate.   CT head stable.   D/w Dr. Reesa Chew from [facility] team for stroke workup/UTI rx.    ____________________________________________   FINAL CLINICAL IMPRESSION(S) / ED DIAGNOSES   Final diagnoses:  Acute cystitis without hematuria  Altered mental status, unspecified altered mental status type      MEDICATIONS GIVEN DURING THIS VISIT:  Medications  dextrose 5 %-0.45 % sodium chloride infusion ( Intravenous New Bag/Given 03/09/19 1553)  acetaminophen (TYLENOL) tablet 650 mg (has no administration in time range)    Or  acetaminophen (TYLENOL) suppository 650 mg (has no administration in time range)  senna-docusate (Senokot-S) tablet 1  tablet (has no administration in time range)  insulin aspart (novoLOG) injection 0-6 Units (0 Units Subcutaneous Not Given 03/09/19 1711)  vancomycin (VANCOCIN) 2,000 mg in sodium chloride 0.9 % 500 mL IVPB (2,000 mg Intravenous New Bag/Given 03/09/19 1638)  meropenem (MERREM) 1 g in sodium chloride 0.9 % 100 mL IVPB (0 g Intravenous Stopped 03/09/19 1633)  vancomycin (VANCOCIN) 1,750 mg in sodium chloride 0.9 % 500 mL IVPB (has no administration in time range)  haloperidol lactate (HALDOL) injection 5 mg (5 mg Intramuscular Given 03/09/19 1115)  cefTRIAXone (ROCEPHIN) 1 g in sodium chloride 0.9 % 100 mL IVPB (0 g Intravenous Stopped 03/09/19 1302)  LORazepam (ATIVAN) injection 2 mg (2 mg Intramuscular Given 03/09/19 1140)  LORazepam (ATIVAN) injection 1 mg (1 mg Intravenous Given 03/09/19 1222)     ED Discharge Orders    None       Note:  This document was  prepared using Dragon voice recognition software and may include unintentional dictation errors.   Vanessa Prescott, MD 03/09/19 1740    Vanessa McLaughlin, MD 03/09/19 [phone]

## 2019-03-09 NOTE — ED Notes (Signed)
Pt cleansed of urination on bed and self. Pt wiped with warm wipes. Pt given new brief, new gown, new sheet, and new draw sheet. Pt scooted up in bed. Pt remains resting with eyes closed and even respirations.

## 2019-03-09 NOTE — ED Notes (Addendum)
Brother at bedside. Group home states that pt has not been able to walk for 3 days. Pt was walking independently when pt was seen here yesterday. Pt got up to side of bed with x2 assistance. Pt seems capable to hollers the entire time like she just doesn't want to walk. Pt walks with two people beside her with minimal assist but pt is taking small, shuffling steps. Pt will walk with just one person holding her hand but begins to yell again. Pt shuffle steps some more to get back to bed. Pt stepping/walking well but appears to just not want to walk. Pt continues to yell "I want to go home." Assisted by this RN and Edison Nasuti, [place].

## 2019-03-09 NOTE — H&P (Addendum)
History and Physical    Rebecca Bruce ZOX:[id number] DOB: 1960-07-06 DOA: 03/09/2019  PCP: Alba Cory, MD Patient coming from: Group home  Chief Complaint: Change in mental status  HPI: Rebecca Bruce is a 58 y.o. female with medical history significant of hypothyroidism, intellectual debility, HTN brought to the hospital for evaluation of altered mental status.  History is provided by the caregiver at bedside. According to the caregiver she noticed patient was having foul-smelling urine couple of days ago and then the following day she developed change in mental status where she was less responsive, poor oral intake, left-sided weakness.  She was brought to the hospital yesterday, basic evaluation was performed including CT of the head which was negative and therefore she was sent back to her facility. After going back she continued to be very agitated, delirious and some visual hallucinations.  Therefore she was brought back to the hospital.  Per caregiver at baseline she is communicative, very energetic and talkative person, ambulates on her own.  In the ER today her basic vital signs and lab work was okay except she was very delirious and agitated.  CT of the head was negative again.  UA was suggestive of urinary tract infection.  Cultures were performed.  She was admitted to the hospital for further care and work-up.   Review of Systems: As per HPI otherwise 10 point review of systems negative.  Review of Systems Difficult to obtain given her mentation  Past Medical History:  Diagnosis Date   GERD (gastroesophageal reflux disease)    Hypertension    Thyroid disease     Past Surgical History:  Procedure Laterality Date   DENTAL SURGERY      SOCIAL HISTORY:  reports that she has never smoked. She has never used smokeless tobacco. She reports that she does not drink alcohol or use drugs.  No Known Allergies  FAMILY HISTORY: Family History  Problem  Relation Age of Onset   Heart disease Mother    Breast cancer Neg Hx      Prior to Admission medications   Medication Sig Start Date End Date Taking? Authorizing Provider  acetaminophen (TYLENOL) 500 MG tablet Take 1 tablet (500 mg total) by mouth every 6 (six) hours as needed. 02/05/19   Alba Cory, MD  atorvastatin (LIPITOR) 20 MG tablet TAKE 1 TABLET BY MOUTH AT BEDTIME FOR CHOLESTEROL 11/01/18   Alba Cory, MD  cefUROXime (CEFTIN) 500 MG tablet Take 1 tablet (500 mg total) by mouth 2 (two) times daily for 7 days. 03/08/19 03/15/19  Concha Se, MD  clindamycin (CLEOCIN T) 1 % lotion Apply topically daily. 02/05/19   Alba Cory, MD  gabapentin (NEURONTIN) 300 MG capsule Take 1 capsule (300 mg total) by mouth at bedtime. 02/05/19   Alba Cory, MD  hydrocortisone cream 0.5 % Apply 1 application topically 2 (two) times daily. 02/05/19   Alba Cory, MD  levothyroxine (SYNTHROID) 100 MCG tablet Take 1 tablet (100 mcg total) by mouth daily before breakfast. 02/05/19   Carlynn Purl, Danna Hefty, MD  LORazepam (ATIVAN) 0.5 MG tablet Take 0.5 mg by mouth daily as needed for anxiety.    Lynnell Grain, MD  mupirocin ointment (BACTROBAN) 2 % Apply topically. 12/20/16   [provider]  PARoxetine (PAXIL) 30 MG tablet TAKE 1 TABLET BY MOUTH AT BEDTIME FOR DEPRESSION 10/03/18   Alba Cory, MD  Probiotic Product (PROBIOTIC-10 ULTIMATE) CAPS TAKE 1 CAPSULE BY MOUTH EVERY DAY. 12/31/18   Alba Cory, MD  sodium chloride (OCEAN) 0.65 % SOLN nasal spray Place 1 spray into both nostrils as needed for congestion. Dc after one week Patient not taking: Reported on 01/30/2019 01/05/18   Alba Cory, MD  triamcinolone cream (KENALOG) 0.1 % Apply 1 application topically 2 (two) times daily. For two weeks and stop, if no improvement return for follow up 03/13/17   Alba Cory, MD  triamterene-hydrochlorothiazide [facility]) 37.5-25 MG tablet TAKE 1/2 TABLET BY MOUTH EVERY DAY  11/01/18   Alba Cory, MD  VIACTIV CALCIUM PLUS D 650-12.5-40 MG-MCG CHEW CHEW AND SWALLOW ONE PIECE TWICE DAILY. (SUPPLEMENT) CARAMEL 11/01/18   Alba Cory, MD    Physical Exam: Vitals:   03/09/19 0853 03/09/19 0904 03/09/19 1128 03/09/19 1332  BP:  (!) 159/100 (!) 149/82 (!) 137/91  Pulse:  (!) 112 (!) 120 (!) 108  Resp:  (!) 43 (!) 22 17  Temp:  98.3 F (36.8 C)    SpO2:  94% 95% 95%  Height: 5\' 2"  (1.575 m)         Constitutional: Drowsy/sedated Respiratory: Clear to auscultation bilaterally Cardiovascular normal sinus rhythm Abdomen: Nondistended positive bowel sounds Musculoskeletal: No gross deformities noted Skin: no rashes, lesions, ulcers. No induration Neurologic: Drowsy, withdraws to pain.  Grossly moves all the extremities Psychiatric: Unable to assess    Labs on Admission: I have personally reviewed following labs and imaging studies  CBC: Recent Labs  Lab 03/08/19 1252 03/09/19 1215  WBC 6.8 6.4  NEUTROABS 5.1 4.9  HGB 14.6 14.9  HCT 44.6 44.3  MCV 80.5 78.0*  PLT 217 223   Basic Metabolic Panel: Recent Labs  Lab 03/08/19 1252 03/09/19 1324  NA 135 136  K 4.1 4.1  CL 97* 98  CO2 26 26  GLUCOSE 131* 117*  BUN 9 9  CREATININE 0.89 0.68  CALCIUM 9.5 8.8*   GFR: Estimated Creatinine Clearance: 81.2 mL/min (by C-G formula based on SCr of 0.68 mg/dL). Liver Function Tests: Recent Labs  Lab 03/08/19 1252  AST 38  ALT 26  ALKPHOS 72  BILITOT 1.0  PROT 7.9  ALBUMIN 4.0   No results for input(s): LIPASE, AMYLASE in the last 168 hours. No results for input(s): AMMONIA in the last 168 hours. Coagulation Profile: No results for input(s): INR, PROTIME in the last 168 hours. Cardiac Enzymes: No results for input(s): CKTOTAL, CKMB, CKMBINDEX, TROPONINI in the last 168 hours. BNP (last 3 results) No results for input(s): PROBNP in the last 8760 hours. HbA1C: No results for input(s): HGBA1C in the last 72 hours. CBG: No results for  input(s): GLUCAP in the last 168 hours. Lipid Profile: No results for input(s): CHOL, HDL, LDLCALC, TRIG, CHOLHDL, LDLDIRECT in the last 72 hours. Thyroid Function Tests: Recent Labs    03/08/19 1253  TSH 2.866  FREET4 1.39*   Anemia Panel: No results for input(s): VITAMINB12, FOLATE, FERRITIN, TIBC, IRON, RETICCTPCT in the last 72 hours. Urine analysis:    Component Value Date/Time   COLORURINE YELLOW (A) 03/08/2019 1433   APPEARANCEUR CLOUDY (A) 03/08/2019 1433   LABSPEC 1.017 03/08/2019 1433   PHURINE 7.0 03/08/2019 1433   GLUCOSEU NEGATIVE 03/08/2019 1433   HGBUR SMALL (A) 03/08/2019 1433   BILIRUBINUR NEGATIVE 03/08/2019 1433   BILIRUBINUR neg 09/25/2018 1447   KETONESUR NEGATIVE 03/08/2019 1433   PROTEINUR 100 (A) 03/08/2019 1433   UROBILINOGEN 0.2 09/25/2018 1447   NITRITE NEGATIVE 03/08/2019 1433   LEUKOCYTESUR LARGE (A) 03/08/2019 1433   Sepsis Labs: !!!!!!!!!!!!!!!!!!!!!!!!!!!!!!!!!!!!!!!!!!!! @LABRCNTIP (procalcitonin:4,lacticidven:4) )No results  found for this or any previous visit (from the past 240 hour(s)).   Radiological Exams on Admission: Ct Head Wo Contrast  Result Date: 03/09/2019 CLINICAL DATA:  Altered mental status EXAM: CT HEAD WITHOUT CONTRAST TECHNIQUE: Contiguous axial images were obtained from the base of the skull through the vertex without intravenous contrast. COMPARISON:  None. FINDINGS: Brain: The ventricles are normal in size and configuration. There is no intracranial mass, hemorrhage, extra-axial fluid collection, or midline shift. The brain parenchyma appears unremarkable. No acute infarct evident. Vascular: No hyperdense vessel. There is no appreciable vascular calcification. Skull: The bony calvarium appears intact. Sinuses/Orbits: There is mucosal thickening in several ethmoid air cells. Other visualized paranasal sinuses are clear. Visualized orbits appear symmetric bilaterally. Other: Mastoid air cells are clear. IMPRESSION: Mucosal  thickening in several ethmoid air cells. Study otherwise unremarkable. Electronically Signed   By: Bretta Bang III M.D.   On: 03/09/2019 13:20   Ct Head Wo Contrast  Result Date: 03/08/2019 CLINICAL DATA:  Altered level of consciousness.  Hypertension. EXAM: CT HEAD WITHOUT CONTRAST TECHNIQUE: Contiguous axial images were obtained from the base of the skull through the vertex without intravenous contrast. COMPARISON:  None. FINDINGS: Brain: The ventricles are normal in size and configuration. There is no intracranial mass, hemorrhage, extra-axial fluid collection, or midline shift. The brain parenchyma appears unremarkable. There is no evident acute infarct. Vascular: There is no hyperdense vessel. There is no appreciable vascular calcification. Skull: The bony calvarium appears intact. Sinuses/Orbits: There is mild mucosal thickening in several ethmoid air cells. Other visualized paranasal sinuses are clear. Visualized orbits appear symmetric bilaterally. Other: Visualized mastoid air cells are clear. IMPRESSION: Mild mucosal thickening in several ethmoid air cells. Study otherwise unremarkable. Electronically Signed   By: Bretta Bang III M.D.   On: 03/08/2019 14:07     All images have been reviewed by me personally.    Assessment/Plan Principal Problem:   Sepsis secondary to UTI [facility]) Active Problems:   Hypothyroidism (acquired)   Hypertension, benign   OA (osteoarthritis) of knee   GERD (gastroesophageal reflux disease)   Hyperlipidemia, unspecified   Mild intellectual disabilities   Altered mental state    Acute metabolic encephalopathy Sepsis secondary to urinary tract infection, end organ damage-change in mental status, tachypnea RR 25 and HR >120 -Admit the patient to the hospital.  CT of the head 11/27-negative, CT of the head 11/28-negative.  Unable to get MRI as she gets combative -Pan culture, start vancomycin meropenem -Follow-up urine culture -Neurochecks, check  ammonia level, TSH  Hypothyroidism -Check TSH.  Awaiting preadmission med rec to be completed.  Once confirmed she can be on IV levothyroxine  Peripheral neuropathy -Gabapentin on hold  Hyperlipidemia -Lipitor on hold  Intellectual disability -Supportive care  Anxiety -On Paxil which is on hold  DVT prophylaxis: Lovenox Code Status: Full Family Communication: Caregiver at bedside  Disposition Plan:  TBD Consults called: None Admission status: Inpatient admission for Sepsis due to UTI/AMS.   Time Spent: 65 minutes.  >50% of the time was devoted to discussing the patients care, assessment, plan and disposition with other care givers along with counseling the patient about the risks and benefits of treatment.    Berneita Sanagustin Joline Maxcy MD [facility]  If 7PM-7AM, please contact night-coverage   03/09/2019, 2:21 PM

## 2019-03-09 NOTE — ED Notes (Signed)
One set of cultures able to be obtained due to pt history of violence and being medicated for bloodwork. MD notified.

## 2019-03-09 NOTE — ED Triage Notes (Signed)
Pt to ED by EMS from [place] where staff state pt presents with AMS and has not urinated since returning from hospital yesterday. Pt is combative and has hx of MR.

## 2019-03-10 ENCOUNTER — Inpatient Hospital Stay: Payer: Medicare Other

## 2019-03-10 DIAGNOSIS — R404 Transient alteration of awareness: Secondary | ICD-10-CM

## 2019-03-10 DIAGNOSIS — U071 COVID-19: Secondary | ICD-10-CM

## 2019-03-10 DIAGNOSIS — I10 Essential (primary) hypertension: Secondary | ICD-10-CM

## 2019-03-10 DIAGNOSIS — E039 Hypothyroidism, unspecified: Secondary | ICD-10-CM

## 2019-03-10 LAB — COMPREHENSIVE METABOLIC PANEL
ALT: 24 U/L (ref 0–44)
AST: 53 U/L — ABNORMAL HIGH (ref 15–41)
Albumin: 3.3 g/dL — ABNORMAL LOW (ref 3.5–5.0)
Alkaline Phosphatase: 62 U/L (ref 38–126)
Anion gap: 10 (ref 5–15)
BUN: 9 mg/dL (ref 6–20)
CO2: 26 mmol/L (ref 22–32)
Calcium: 8.2 mg/dL — ABNORMAL LOW (ref 8.9–10.3)
Chloride: 100 mmol/L (ref 98–111)
Creatinine, Ser: 0.64 mg/dL (ref 0.44–1.00)
GFR calc Af Amer: >60 mL/min (ref >60)
GFR calc non Af Amer: >60 mL/min (ref >60)
Glucose, Bld: 133 mg/dL — ABNORMAL HIGH (ref 70–99)
Potassium: 3.6 mmol/L (ref 3.5–5.1)
Sodium: 136 mmol/L (ref 135–145)
Total Bilirubin: 1 mg/dL (ref 0.3–1.2)
Total Protein: 7 g/dL (ref 6.5–8.1)

## 2019-03-10 LAB — GLUCOSE, CAPILLARY
Glucose-Capillary: 122 mg/dL — ABNORMAL HIGH (ref 70–99)
Glucose-Capillary: 121 mg/dL — ABNORMAL HIGH (ref 70–99)
Glucose-Capillary: 128 mg/dL — ABNORMAL HIGH (ref 70–99)

## 2019-03-10 LAB — HIV ANTIBODY (ROUTINE TESTING W REFLEX): HIV Screen 4th Generation wRfx: NONREACTIVE

## 2019-03-10 LAB — CBC
HCT: 42.7 % (ref 36.0–46.0)
Hemoglobin: 13.9 g/dL (ref 12.0–15.0)
MCH: 26.4 pg (ref 26.0–34.0)
MCHC: 32.6 g/dL (ref 30.0–36.0)
MCV: 81 fL (ref 80.0–100.0)
Platelets: 206 10*3/uL (ref 150–400)
RBC: 5.27 MIL/uL — ABNORMAL HIGH (ref 3.87–5.11)
RDW: 14 % (ref 11.5–15.5)
WBC: 4.7 10*3/uL (ref 4.0–10.5)
nRBC: 0 % (ref 0.0–0.2)

## 2019-03-10 LAB — SARS CORONAVIRUS 2 (TAT 6-24 HRS): SARS Coronavirus 2: POSITIVE — AB

## 2019-03-10 LAB — PROCALCITONIN: Procalcitonin: <0.1 ng/mL

## 2019-03-10 IMAGING — DX DG CHEST 1V PORT
1 series · 1 of 1 positions shown · non-contrast
Comparison: [DATE]

CLINICAL DATA: Acute mental status change.

EXAM:
PORTABLE CHEST 1 VIEW

[chest ap]
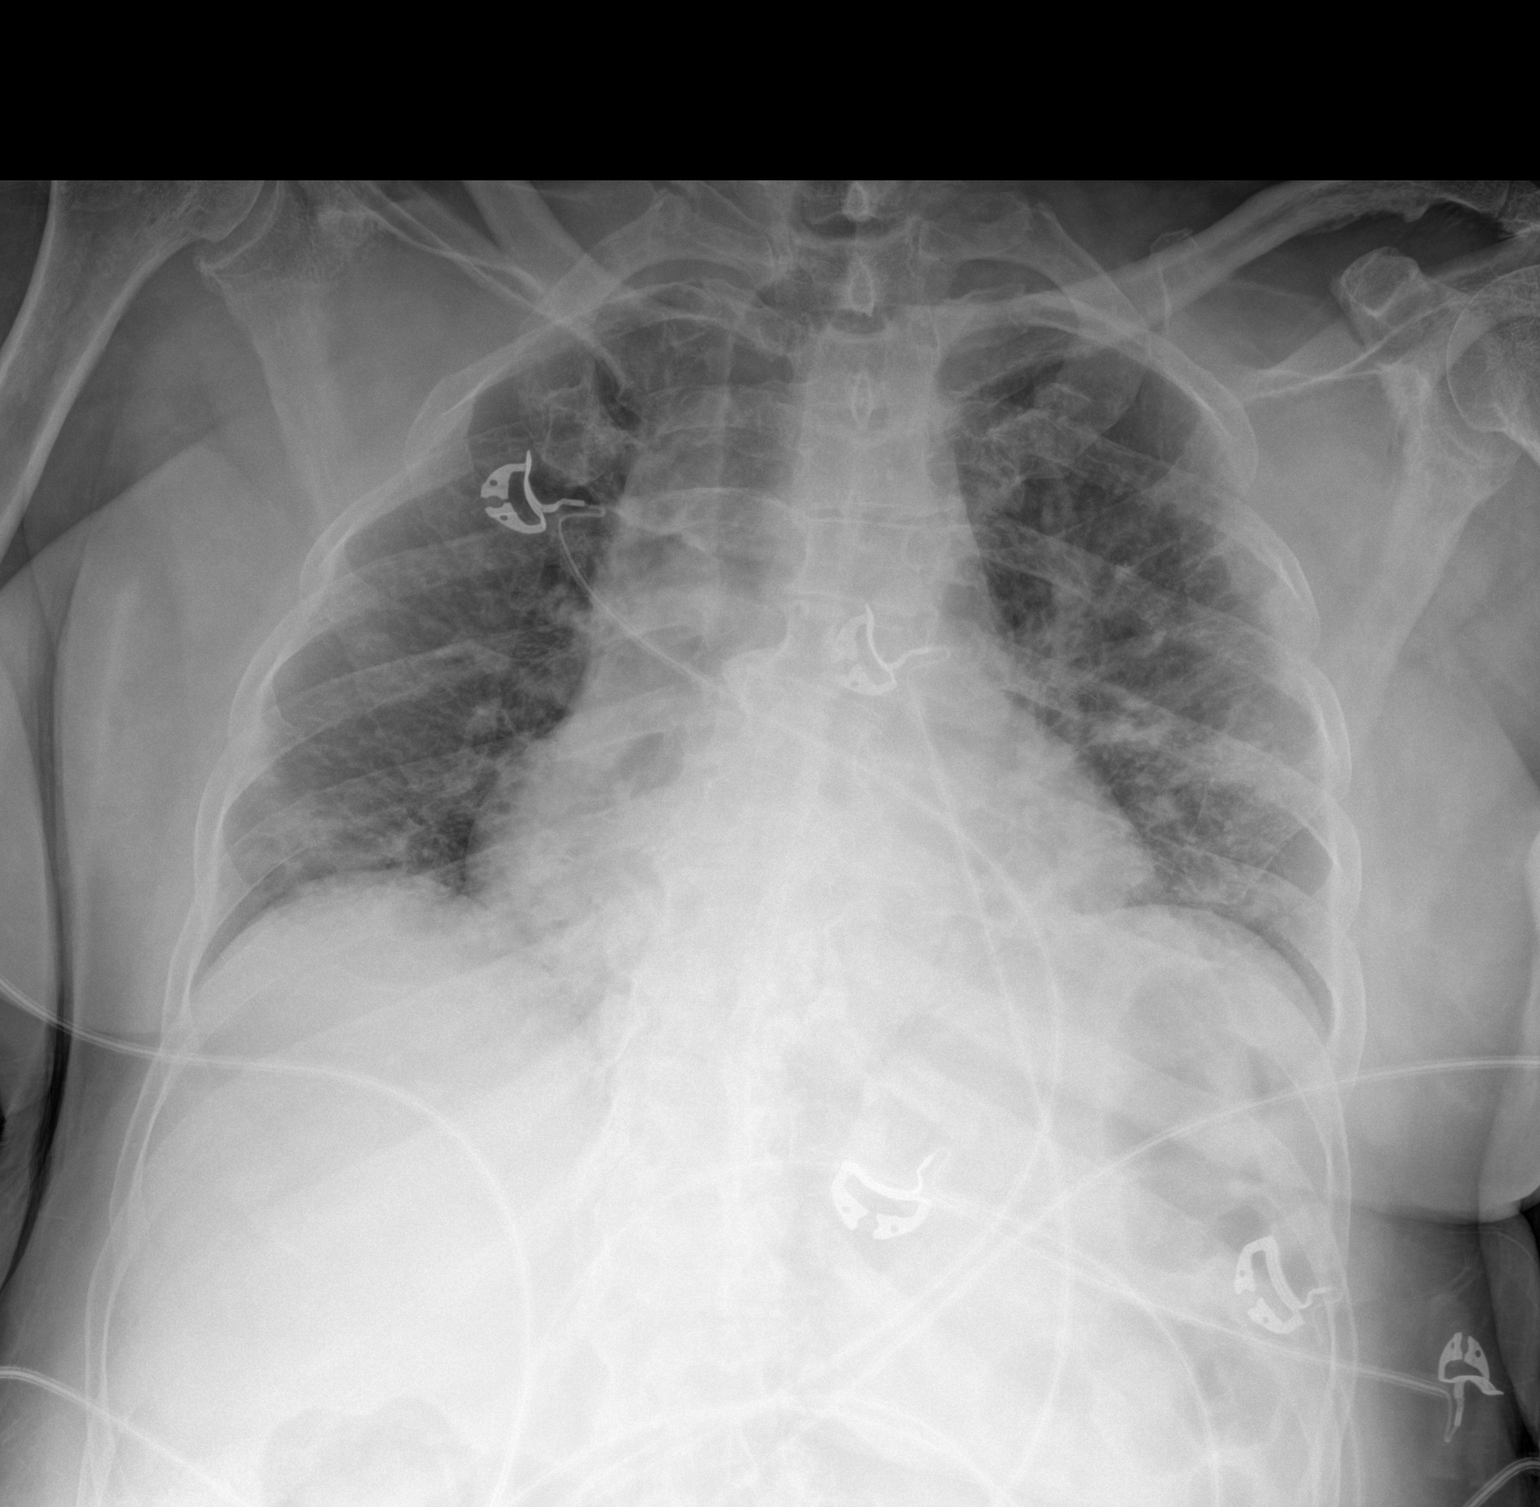

[1 of 1 positions shown; findings below may reference images not displayed]

FINDINGS: No pneumothorax. The heart size is borderline to mildly enlarged but
poorly assessed on a portable film. Hila and mediastinum are
unremarkable. Bilateral patchy pulmonary opacities are identified.
No other acute abnormalities.
IMPRESSION: 1. Bilateral patchy pulmonary infiltrates worrisome for pneumonia.
Atypical infections should be considered.
2. No other acute abnormalities are identified.

## 2019-03-10 MED ORDER — LEVOTHYROXINE SODIUM 100 MCG/5ML IV SOLN
50.0000 ug | Freq: Every day | INTRAVENOUS | Status: DC
  Administered 2019-03-10 – 2019-03-11 (×2): 50 ug via INTRAVENOUS
  Filled 2019-03-10 (×2): qty 5

## 2019-03-10 MED ORDER — SODIUM CHLORIDE 0.9 % IV SOLN
100.0000 mg | INTRAVENOUS | Status: DC
  Administered 2019-03-11: 100 mg via INTRAVENOUS
  Filled 2019-03-10 (×2): qty 20

## 2019-03-10 MED ORDER — ZINC SULFATE 220 (50 ZN) MG PO CAPS
220.0000 mg | ORAL_CAPSULE | Freq: Every day | ORAL | Status: DC
  Filled 2019-03-10 (×2): qty 1

## 2019-03-10 MED ORDER — VITAMIN C 500 MG/5ML PO SYRP
500.0000 mg | ORAL_SOLUTION | Freq: Every day | ORAL | Status: DC
  Filled 2019-03-10 (×2): qty 5

## 2019-03-10 MED ORDER — DEXAMETHASONE SODIUM PHOSPHATE 10 MG/ML IJ SOLN
6.0000 mg | INTRAMUSCULAR | Status: DC
  Administered 2019-03-10 – 2019-03-11 (×2): 6 mg via INTRAVENOUS
  Filled 2019-03-10: qty 0.6
  Filled 2019-03-10: qty 1

## 2019-03-10 MED ORDER — DEXTROSE-NACL 5-0.45 % IV SOLN
INTRAVENOUS | Status: DC
  Administered 2019-03-10 – 2019-03-11 (×2): via INTRAVENOUS

## 2019-03-10 MED ORDER — SODIUM CHLORIDE 0.9 % IV SOLN
200.0000 mg | Freq: Once | INTRAVENOUS | Status: AC
  Administered 2019-03-10: 200 mg via INTRAVENOUS
  Filled 2019-03-10: qty 40

## 2019-03-10 NOTE — ED Notes (Signed)
Bed pad changed, clean and dry.

## 2019-03-10 NOTE — ED Notes (Signed)
This RN attempted to feed patient lunch.  Patient would not eat or drink.  Patient's brother states patient eats a normal diet regularly, does not eat pureed foods.  Patient is alert and responsive at this time, occasionally moaning.

## 2019-03-10 NOTE — Consult Note (Signed)
Remdesivir - Pharmacy Brief Note   O:  ALT: 24 U/L CXR: Bilateral patchy pulmonary infiltrates worrisome for pneumonia. Atypical infections should be considered  SpO2: 95% on room air   A/P:  Remdesivir 200 mg IVPB once followed by 100 mg IVPB daily x 4 days.   [name], PharmD Clinical Pharmacist 03/10/2019 3:08 PM

## 2019-03-10 NOTE — ED Notes (Signed)
Elmyra Ricks EDT attempted to feed patient but patient refused.  Will attempt again at lunch.

## 2019-03-10 NOTE — Progress Notes (Addendum)
1        [facility] [place] NAME: Rebecca Bruce    MR#:  [id number]  DATE OF BIRTH:  1960/11/09  SUBJECTIVE:  CHIEF COMPLAINT:   Chief Complaint  Patient presents with  . Altered Mental Status  Febrile, tachycardic, tachypneic.  Does not communicate much REVIEW OF SYSTEMS:  ROS unable to obtain due to mental status DRUG ALLERGIES:  No Known Allergies VITALS:  Blood pressure (!) 157/93, pulse (!) 109, temperature (!) 101.1 F (38.4 C), temperature source Axillary, resp. rate (!) 40, height 5\' 2"  (1.575 m), SpO2 96 %. PHYSICAL EXAMINATION:  Physical Exam  Constitutional: Drowsy/lethargic Respiratory:  Decreased breath sounds bilaterally Cardiovascular normal sinus rhythm Abdomen: Nondistended positive bowel sounds Musculoskeletal: No gross deformities noted Skin: no rashes, lesions, ulcers. No induration Neurologic: Drowsy/lethargic, withdraws to pain.  Grossly moves all the extremities Psychiatric: Unable to assess LABORATORY PANEL:  Female CBC Recent Labs  Lab 03/10/19 0527  WBC 4.7  HGB 13.9  HCT 42.7  PLT 206   ------------------------------------------------------------------------------------------------------------------ Chemistries  Recent Labs  Lab 03/10/19 0527  NA 136  K 3.6  CL 100  CO2 26  GLUCOSE 133*  BUN 9  CREATININE 0.64  CALCIUM 8.2*  AST 53*  ALT 24  ALKPHOS 62  BILITOT 1.0   RADIOLOGY:  No results found. ASSESSMENT AND PLAN:   Acute metabolic encephalopathy Sepsis present on admission secondary to urinary tract infection, end organ damage- - CT of the head 11/27-negative, CT of the head 11/28-negative.  Unable to get MRI as she gets combative -Pan culture, continue vancomycin meropenem -Follow-up urine culture -Neurochecks, normal ammonia level, TSH within normal limit  Covid infection We will request pharmacy start remdesivir and steroids if appropriate Check chest x-ray Dexamethasone 6mg  iv daily  Remdesivir 100 mg IV daily -Dailyferritin, LDH, CRP, d-dimer, fibrinogen -Vitamin C &Zinc. Prone >16hrs/day.  -Daily CMP to monitor liver function test while on remdesivir  Hypothyroidism -Normal TSH.  Continue IV levothyroxine  Peripheral neuropathy -Gabapentin on hold  Hyperlipidemia -Lipitor on hold  Intellectual disability -Supportive care  Anxiety -On Paxil which is on hold     All the records are reviewed and case discussed with Care Management/Social Worker. Management plans discussed with the patient, nursing and they are in agreement.  CODE STATUS: Full Code  TOTAL TIME TAKING CARE OF THIS PATIENT: 35 minutes.   More than 50% of the time was spent in counseling/coordination of care: YES  POSSIBLE D/C IN 4-5 DAYS, DEPENDING ON CLINICAL CONDITION.   Max Sane M.D on 03/10/2019 at 2:12 PM  Between 7am to 6pm - Pager - [phone]  After 6pm go to [email or web address] - password [email or web address]  [facility]   CC: Primary care physician; Steele Sizer, MD  Note: This dictation was prepared with Dragon dictation along with smaller phrase technology. Any transcriptional errors that result from this process are unintentional.

## 2019-03-11 ENCOUNTER — Other Ambulatory Visit: Payer: Self-pay

## 2019-03-11 ENCOUNTER — Inpatient Hospital Stay (HOSPITAL_COMMUNITY)
Admission: AD | Admit: 2019-03-11 | Discharge: 2019-04-21 | DRG: 870 | Disposition: A | Discharge status: Rehabilitation Facility | Payer: Medicare Other | Source: Other Acute Inpatient Hospital | Attending: Internal Medicine | Admitting: Internal Medicine

## 2019-03-11 DIAGNOSIS — R58 Hemorrhage, not elsewhere classified: Secondary | ICD-10-CM

## 2019-03-11 DIAGNOSIS — R030 Elevated blood-pressure reading, without diagnosis of hypertension: Secondary | ICD-10-CM | POA: Diagnosis not present

## 2019-03-11 DIAGNOSIS — E871 Hypo-osmolality and hyponatremia: Secondary | ICD-10-CM | POA: Diagnosis not present

## 2019-03-11 DIAGNOSIS — J9602 Acute respiratory failure with hypercapnia: Secondary | ICD-10-CM | POA: Diagnosis not present

## 2019-03-11 DIAGNOSIS — M5000 Cervical disc disorder with myelopathy, unspecified cervical region: Secondary | ICD-10-CM | POA: Diagnosis present

## 2019-03-11 DIAGNOSIS — G959 Disease of spinal cord, unspecified: Secondary | ICD-10-CM | POA: Diagnosis not present

## 2019-03-11 DIAGNOSIS — F061 Catatonic disorder due to known physiological condition: Secondary | ICD-10-CM | POA: Diagnosis not present

## 2019-03-11 DIAGNOSIS — R5381 Other malaise: Secondary | ICD-10-CM | POA: Diagnosis present

## 2019-03-11 DIAGNOSIS — E669 Obesity, unspecified: Secondary | ICD-10-CM | POA: Diagnosis present

## 2019-03-11 DIAGNOSIS — D72823 Leukemoid reaction: Secondary | ICD-10-CM | POA: Diagnosis not present

## 2019-03-11 DIAGNOSIS — R571 Hypovolemic shock: Secondary | ICD-10-CM | POA: Diagnosis not present

## 2019-03-11 DIAGNOSIS — M4802 Spinal stenosis, cervical region: Secondary | ICD-10-CM | POA: Diagnosis not present

## 2019-03-11 DIAGNOSIS — Z01818 Encounter for other preprocedural examination: Secondary | ICD-10-CM | POA: Diagnosis not present

## 2019-03-11 DIAGNOSIS — J9601 Acute respiratory failure with hypoxia: Secondary | ICD-10-CM | POA: Diagnosis not present

## 2019-03-11 DIAGNOSIS — J1282 Pneumonia due to coronavirus disease 2019: Secondary | ICD-10-CM | POA: Diagnosis present

## 2019-03-11 DIAGNOSIS — Z6834 Body mass index (BMI) 34.0-34.9, adult: Secondary | ICD-10-CM | POA: Diagnosis not present

## 2019-03-11 DIAGNOSIS — U071 COVID-19: Secondary | ICD-10-CM

## 2019-03-11 DIAGNOSIS — R627 Adult failure to thrive: Secondary | ICD-10-CM | POA: Diagnosis not present

## 2019-03-11 DIAGNOSIS — G9341 Metabolic encephalopathy: Secondary | ICD-10-CM | POA: Diagnosis not present

## 2019-03-11 DIAGNOSIS — E785 Hyperlipidemia, unspecified: Secondary | ICD-10-CM | POA: Diagnosis present

## 2019-03-11 DIAGNOSIS — E440 Moderate protein-calorie malnutrition: Secondary | ICD-10-CM | POA: Diagnosis present

## 2019-03-11 DIAGNOSIS — A4189 Other specified sepsis: Secondary | ICD-10-CM | POA: Diagnosis not present

## 2019-03-11 DIAGNOSIS — E870 Hyperosmolality and hypernatremia: Secondary | ICD-10-CM | POA: Diagnosis not present

## 2019-03-11 DIAGNOSIS — F202 Catatonic schizophrenia: Secondary | ICD-10-CM | POA: Diagnosis not present

## 2019-03-11 DIAGNOSIS — Z452 Encounter for adjustment and management of vascular access device: Secondary | ICD-10-CM

## 2019-03-11 DIAGNOSIS — R4182 Altered mental status, unspecified: Secondary | ICD-10-CM | POA: Diagnosis not present

## 2019-03-11 DIAGNOSIS — D62 Acute posthemorrhagic anemia: Secondary | ICD-10-CM | POA: Diagnosis not present

## 2019-03-11 DIAGNOSIS — J1289 Other viral pneumonia: Secondary | ICD-10-CM | POA: Diagnosis not present

## 2019-03-11 DIAGNOSIS — K661 Hemoperitoneum: Secondary | ICD-10-CM | POA: Diagnosis not present

## 2019-03-11 DIAGNOSIS — R578 Other shock: Secondary | ICD-10-CM

## 2019-03-11 DIAGNOSIS — K7200 Acute and subacute hepatic failure without coma: Secondary | ICD-10-CM | POA: Diagnosis not present

## 2019-03-11 DIAGNOSIS — D72829 Elevated white blood cell count, unspecified: Secondary | ICD-10-CM | POA: Diagnosis not present

## 2019-03-11 DIAGNOSIS — L8990 Pressure ulcer of unspecified site, unspecified stage: Secondary | ICD-10-CM | POA: Diagnosis not present

## 2019-03-11 DIAGNOSIS — G9340 Encephalopathy, unspecified: Secondary | ICD-10-CM | POA: Diagnosis not present

## 2019-03-11 DIAGNOSIS — I6502 Occlusion and stenosis of left vertebral artery: Secondary | ICD-10-CM | POA: Diagnosis not present

## 2019-03-11 DIAGNOSIS — I10 Essential (primary) hypertension: Secondary | ICD-10-CM | POA: Diagnosis not present

## 2019-03-11 DIAGNOSIS — E876 Hypokalemia: Secondary | ICD-10-CM | POA: Diagnosis not present

## 2019-03-11 DIAGNOSIS — N390 Urinary tract infection, site not specified: Secondary | ICD-10-CM | POA: Diagnosis not present

## 2019-03-11 DIAGNOSIS — R1310 Dysphagia, unspecified: Secondary | ICD-10-CM | POA: Diagnosis not present

## 2019-03-11 DIAGNOSIS — F419 Anxiety disorder, unspecified: Secondary | ICD-10-CM | POA: Diagnosis present

## 2019-03-11 DIAGNOSIS — R7401 Elevation of levels of liver transaminase levels: Secondary | ICD-10-CM | POA: Diagnosis not present

## 2019-03-11 DIAGNOSIS — G629 Polyneuropathy, unspecified: Secondary | ICD-10-CM | POA: Diagnosis present

## 2019-03-11 DIAGNOSIS — E039 Hypothyroidism, unspecified: Secondary | ICD-10-CM | POA: Diagnosis present

## 2019-03-11 DIAGNOSIS — F329 Major depressive disorder, single episode, unspecified: Secondary | ICD-10-CM | POA: Diagnosis present

## 2019-03-11 DIAGNOSIS — Z4682 Encounter for fitting and adjustment of non-vascular catheter: Secondary | ICD-10-CM | POA: Diagnosis not present

## 2019-03-11 DIAGNOSIS — R4020 Unspecified coma: Secondary | ICD-10-CM

## 2019-03-11 DIAGNOSIS — Z4659 Encounter for fitting and adjustment of other gastrointestinal appliance and device: Secondary | ICD-10-CM

## 2019-03-11 DIAGNOSIS — T380X5A Adverse effect of glucocorticoids and synthetic analogues, initial encounter: Secondary | ICD-10-CM | POA: Diagnosis not present

## 2019-03-11 DIAGNOSIS — I776 Arteritis, unspecified: Secondary | ICD-10-CM | POA: Diagnosis not present

## 2019-03-11 DIAGNOSIS — R404 Transient alteration of awareness: Secondary | ICD-10-CM | POA: Diagnosis not present

## 2019-03-11 DIAGNOSIS — N3000 Acute cystitis without hematuria: Secondary | ICD-10-CM | POA: Diagnosis not present

## 2019-03-11 DIAGNOSIS — Z9911 Dependence on respirator [ventilator] status: Secondary | ICD-10-CM | POA: Diagnosis not present

## 2019-03-11 DIAGNOSIS — F79 Unspecified intellectual disabilities: Secondary | ICD-10-CM | POA: Diagnosis present

## 2019-03-11 DIAGNOSIS — R062 Wheezing: Secondary | ICD-10-CM | POA: Diagnosis not present

## 2019-03-11 DIAGNOSIS — R911 Solitary pulmonary nodule: Secondary | ICD-10-CM | POA: Diagnosis present

## 2019-03-11 DIAGNOSIS — G9349 Other encephalopathy: Secondary | ICD-10-CM | POA: Diagnosis not present

## 2019-03-11 DIAGNOSIS — J9690 Respiratory failure, unspecified, unspecified whether with hypoxia or hypercapnia: Secondary | ICD-10-CM

## 2019-03-11 DIAGNOSIS — Z9119 Patient's noncompliance with other medical treatment and regimen: Secondary | ICD-10-CM

## 2019-03-11 DIAGNOSIS — K219 Gastro-esophageal reflux disease without esophagitis: Secondary | ICD-10-CM | POA: Diagnosis present

## 2019-03-11 DIAGNOSIS — R7303 Prediabetes: Secondary | ICD-10-CM | POA: Diagnosis not present

## 2019-03-11 DIAGNOSIS — R29818 Other symptoms and signs involving the nervous system: Secondary | ICD-10-CM | POA: Diagnosis not present

## 2019-03-11 DIAGNOSIS — N179 Acute kidney failure, unspecified: Secondary | ICD-10-CM | POA: Diagnosis not present

## 2019-03-11 DIAGNOSIS — Z978 Presence of other specified devices: Secondary | ICD-10-CM

## 2019-03-11 DIAGNOSIS — R739 Hyperglycemia, unspecified: Secondary | ICD-10-CM | POA: Diagnosis not present

## 2019-03-11 DIAGNOSIS — E46 Unspecified protein-calorie malnutrition: Secondary | ICD-10-CM | POA: Diagnosis not present

## 2019-03-11 DIAGNOSIS — E279 Disorder of adrenal gland, unspecified: Secondary | ICD-10-CM | POA: Diagnosis present

## 2019-03-11 DIAGNOSIS — Z0189 Encounter for other specified special examinations: Secondary | ICD-10-CM

## 2019-03-11 DIAGNOSIS — G92 Toxic encephalopathy: Secondary | ICD-10-CM | POA: Diagnosis not present

## 2019-03-11 DIAGNOSIS — N170 Acute kidney failure with tubular necrosis: Secondary | ICD-10-CM | POA: Diagnosis not present

## 2019-03-11 DIAGNOSIS — B948 Sequelae of other specified infectious and parasitic diseases: Secondary | ICD-10-CM | POA: Diagnosis not present

## 2019-03-11 DIAGNOSIS — R403 Persistent vegetative state: Secondary | ICD-10-CM | POA: Diagnosis not present

## 2019-03-11 DIAGNOSIS — Z8616 Personal history of COVID-19: Secondary | ICD-10-CM

## 2019-03-11 DIAGNOSIS — K922 Gastrointestinal hemorrhage, unspecified: Secondary | ICD-10-CM | POA: Diagnosis not present

## 2019-03-11 DIAGNOSIS — A419 Sepsis, unspecified organism: Secondary | ICD-10-CM | POA: Diagnosis not present

## 2019-03-11 LAB — GLUCOSE, CAPILLARY
Glucose-Capillary: 135 mg/dL — ABNORMAL HIGH (ref 70–99)
Glucose-Capillary: 137 mg/dL — ABNORMAL HIGH (ref 70–99)
Glucose-Capillary: 138 mg/dL — ABNORMAL HIGH (ref 70–99)
Glucose-Capillary: 151 mg/dL — ABNORMAL HIGH (ref 70–99)

## 2019-03-11 LAB — COMPREHENSIVE METABOLIC PANEL
ALT: 29 U/L (ref 0–44)
AST: 56 U/L — ABNORMAL HIGH (ref 15–41)
Albumin: 3.2 g/dL — ABNORMAL LOW (ref 3.5–5.0)
Alkaline Phosphatase: 61 U/L (ref 38–126)
Anion gap: 11 (ref 5–15)
BUN: 11 mg/dL (ref 6–20)
CO2: 23 mmol/L (ref 22–32)
Calcium: 8 mg/dL — ABNORMAL LOW (ref 8.9–10.3)
Chloride: 103 mmol/L (ref 98–111)
Creatinine, Ser: 0.49 mg/dL (ref 0.44–1.00)
GFR calc Af Amer: >60 mL/min (ref >60)
GFR calc non Af Amer: >60 mL/min (ref >60)
Glucose, Bld: 138 mg/dL — ABNORMAL HIGH (ref 70–99)
Potassium: 3.2 mmol/L — ABNORMAL LOW (ref 3.5–5.1)
Sodium: 137 mmol/L (ref 135–145)
Total Bilirubin: 1.1 mg/dL (ref 0.3–1.2)
Total Protein: 7 g/dL (ref 6.5–8.1)

## 2019-03-11 LAB — FERRITIN: Ferritin: 900 ng/mL — ABNORMAL HIGH (ref 11–307)

## 2019-03-11 LAB — CBC
HCT: 42.8 % (ref 36.0–46.0)
Hemoglobin: 14.2 g/dL (ref 12.0–15.0)
MCH: 26.3 pg (ref 26.0–34.0)
MCHC: 33.2 g/dL (ref 30.0–36.0)
MCV: 79.3 fL — ABNORMAL LOW (ref 80.0–100.0)
Platelets: 198 10*3/uL (ref 150–400)
RBC: 5.4 MIL/uL — ABNORMAL HIGH (ref 3.87–5.11)
RDW: 13.8 % (ref 11.5–15.5)
WBC: 4 10*3/uL (ref 4.0–10.5)
nRBC: 0 % (ref 0.0–0.2)

## 2019-03-11 LAB — URINE CULTURE

## 2019-03-11 LAB — PROCALCITONIN: Procalcitonin: <0.1 ng/mL

## 2019-03-11 LAB — C-REACTIVE PROTEIN: CRP: 11.2 mg/dL — ABNORMAL HIGH (ref <1.0)

## 2019-03-11 LAB — FIBRIN DERIVATIVES D-DIMER (ARMC ONLY): Fibrin derivatives D-dimer (ARMC): 7195.97 ng/mL (FEU) — ABNORMAL HIGH (ref 0.00–499.00)

## 2019-03-11 LAB — FIBRINOGEN: Fibrinogen: 524 mg/dL — ABNORMAL HIGH (ref 210–475)

## 2019-03-11 MED ORDER — ENOXAPARIN SODIUM 40 MG/0.4ML ~~LOC~~ SOLN
40.0000 mg | SUBCUTANEOUS | Status: DC
  Administered 2019-03-11: 40 mg via SUBCUTANEOUS
  Filled 2019-03-11: qty 0.4

## 2019-03-11 MED ORDER — DEXAMETHASONE SODIUM PHOSPHATE 10 MG/ML IJ SOLN
6.0000 mg | INTRAMUSCULAR | Status: DC
  Administered 2019-03-12 – 2019-03-14 (×3): 6 mg via INTRAVENOUS
  Filled 2019-03-11 (×3): qty 1

## 2019-03-11 MED ORDER — CHLORHEXIDINE GLUCONATE 0.12 % MT SOLN
15.0000 mL | Freq: Two times a day (BID) | OROMUCOSAL | Status: DC
  Administered 2019-03-11: 15 mL via OROMUCOSAL
  Filled 2019-03-11: qty 15

## 2019-03-11 MED ORDER — ORAL CARE MOUTH RINSE
15.0000 mL | Freq: Two times a day (BID) | OROMUCOSAL | Status: DC
  Administered 2019-03-11: 15 mL via OROMUCOSAL

## 2019-03-11 MED ORDER — LORAZEPAM 0.5 MG PO TABS
0.5000 mg | ORAL_TABLET | Freq: Every day | ORAL | Status: DC | PRN
  Filled 2019-03-11: qty 1

## 2019-03-11 MED ORDER — SODIUM CHLORIDE 0.9 % IV SOLN
1.0000 g | Freq: Three times a day (TID) | INTRAVENOUS | Status: DC
  Administered 2019-03-11 – 2019-03-13 (×6): 1 g via INTRAVENOUS
  Filled 2019-03-11 (×8): qty 1

## 2019-03-11 MED ORDER — ACETAMINOPHEN 325 MG PO TABS
650.0000 mg | ORAL_TABLET | Freq: Four times a day (QID) | ORAL | Status: DC | PRN
  Filled 2019-03-11: qty 2

## 2019-03-11 MED ORDER — SACCHAROMYCES BOULARDII 250 MG PO CAPS
250.0000 mg | ORAL_CAPSULE | Freq: Every day | ORAL | Status: DC
  Filled 2019-03-11 (×4): qty 1

## 2019-03-11 MED ORDER — HYDROCOD POLST-CPM POLST ER 10-8 MG/5ML PO SUER: 5.0000 mL | Freq: Two times a day (BID) | ORAL | Status: DC | PRN

## 2019-03-11 MED ORDER — ZINC SULFATE 220 (50 ZN) MG PO CAPS: 220.0000 mg | ORAL_CAPSULE | Freq: Every day | ORAL | Status: DC

## 2019-03-11 MED ORDER — LABETALOL HCL 5 MG/ML IV SOLN
10.0000 mg | INTRAVENOUS | Status: DC | PRN
  Administered 2019-03-11 (×2): 10 mg via INTRAVENOUS
  Filled 2019-03-11 (×2): qty 4

## 2019-03-11 MED ORDER — VANCOMYCIN HCL 10 G IV SOLR: 1750.0000 mg | INTRAVENOUS | Status: DC

## 2019-03-11 MED ORDER — DEXAMETHASONE SODIUM PHOSPHATE 10 MG/ML IJ SOLN: 6.0000 mg | INTRAMUSCULAR | 0 refills | Status: DC

## 2019-03-11 MED ORDER — IPRATROPIUM-ALBUTEROL 20-100 MCG/ACT IN AERS
1.0000 | INHALATION_SPRAY | Freq: Four times a day (QID) | RESPIRATORY_TRACT | Status: DC
  Administered 2019-03-13 (×2): 1 via RESPIRATORY_TRACT
  Filled 2019-03-11: qty 4

## 2019-03-11 MED ORDER — GUAIFENESIN-DM 100-10 MG/5ML PO SYRP
10.0000 mL | ORAL_SOLUTION | ORAL | Status: DC | PRN
  Administered 2019-03-17: 10 mL via ORAL
  Filled 2019-03-11 (×2): qty 10

## 2019-03-11 MED ORDER — VITAMIN C 500 MG/5ML PO SYRP: 500.0000 mg | ORAL_SOLUTION | Freq: Every day | ORAL | 12 refills | Status: DC

## 2019-03-11 MED ORDER — VITAMIN C 500 MG PO TABS: 500.0000 mg | ORAL_TABLET | Freq: Every day | ORAL | Status: DC

## 2019-03-11 MED ORDER — SODIUM CHLORIDE 0.9 % IV SOLN: 1.0000 g | Freq: Three times a day (TID) | INTRAVENOUS | Status: DC

## 2019-03-11 MED ORDER — TRIAMTERENE-HCTZ 37.5-25 MG PO TABS
0.5000 | ORAL_TABLET | Freq: Every day | ORAL | Status: DC
  Filled 2019-03-11: qty 0.5

## 2019-03-11 MED ORDER — GABAPENTIN 300 MG PO CAPS: 300.0000 mg | ORAL_CAPSULE | Freq: Every day | ORAL | Status: DC

## 2019-03-11 MED ORDER — ATORVASTATIN CALCIUM 10 MG PO TABS
20.0000 mg | ORAL_TABLET | Freq: Every day | ORAL | Status: DC
  Filled 2019-03-11: qty 2

## 2019-03-11 MED ORDER — SODIUM CHLORIDE 0.9 % IV SOLN
100.0000 mg | INTRAVENOUS | Status: AC
  Administered 2019-03-12 – 2019-03-14 (×3): 100 mg via INTRAVENOUS
  Filled 2019-03-11 (×3): qty 100

## 2019-03-11 MED ORDER — DOCUSATE SODIUM 100 MG PO CAPS: 100.0000 mg | ORAL_CAPSULE | Freq: Every day | ORAL | Status: DC

## 2019-03-11 MED ORDER — LEVOTHYROXINE SODIUM 50 MCG PO TABS: 100.0000 ug | ORAL_TABLET | Freq: Every day | ORAL | Status: DC

## 2019-03-11 MED ORDER — PAROXETINE HCL 30 MG PO TABS
30.0000 mg | ORAL_TABLET | Freq: Every day | ORAL | Status: DC
  Filled 2019-03-11 (×2): qty 1

## 2019-03-11 NOTE — Progress Notes (Addendum)
Report given to RN Maryland Pink at [facility]. Per carelink it will be greater than an hour to pick patient up. Patient will be going to room 127 at [facility].  Verified with Dr. Manuella Ghazi about Q 2 hr Vital Signs, per MD no need for Q 2 hours.

## 2019-03-11 NOTE — Discharge Summary (Signed)
Patient is being transferred to [facility]. Please refer to Dr Latina Craver dictated H & P and my rounding note from today for further details.  Pt non verbal, not following commands, but responding to pain. She is not moving much and may benefit from MRI of brain and spine when able. She wasn't able to get MRI done here as she was combative.

## 2019-03-11 NOTE — Progress Notes (Signed)
Pt came up on 03-10-19 around 2200. Pt non verbal, not following commands, but responding to pain. At certain times when talking to her she will look and you. The pt is stiff when trying to move her and at times in like a "catatonic" state.   Later on in the morning, pt able to follow me with her eyes, and when I said that I would be back, she did say ok. At times pt is yelling, when go to room, the pt quiiets down. Will continue to monitor.   Unable to complete admission assessment.

## 2019-03-11 NOTE — Progress Notes (Signed)
Chart reviewed. Pt not able to follow directions or swallow per notes. Will alter diet altered to dysphagia 1 nectar thick in the event that Pt is able tolerate any Po's. Will hold off on eval until Pt is able to actively participate. Noted green dot on chart indicating possible discharge or transfer. ST to follow up with Nsg/MD. Pt has COVID positive test results, will limit contact until necessary and appropriate. F/u in am to see if Pt is still at [facility].

## 2019-03-11 NOTE — Progress Notes (Addendum)
Patient non-verbal. 2 assist to turn patient over. Bottom skin intact, foam in place. Patient follows with eyes but can not follow commands. Attempted to complete NIHSS but could not complete all parts due to patient being nonverbal. Blood pressure high (see flowsheet). MD aware.  Update: [facility] on the way. VSS done, MD completed reassessment EMTALA form. Blood pressure better (see flowsheet).   Update 1808: [facility] here for patient.

## 2019-03-11 NOTE — Progress Notes (Signed)
Pt not speaking. Following me with her eyes. Gave her a small bite of cream of wheat and she would not swallow it. Tried asking her some questions to get her to communicate but nothing. Pt's eyes are open however and she watches me work.

## 2019-03-11 NOTE — Progress Notes (Signed)
Pt arrived via carelink from [facility]. Pt alert but nonverbal and not interactive. Placed on monitors and purewick, bed alarm activated. Called pts legal guardian/POA/brother Rebecca Bruce to let him know Rebecca Bruce had arrived here. Let him know that we would call twice daily with updates. He asked that due to his hearing impairment, we call his ex wife Rebecca Bruce with the updates and she will relay info to him. He understands that we designate one contact person and this is his wish. Continue to monitor. Hortencia Conradi RN

## 2019-03-12 ENCOUNTER — Other Ambulatory Visit: Payer: Self-pay

## 2019-03-12 ENCOUNTER — Inpatient Hospital Stay: Payer: Self-pay

## 2019-03-12 ENCOUNTER — Encounter (HOSPITAL_COMMUNITY): Payer: Self-pay

## 2019-03-12 DIAGNOSIS — I10 Essential (primary) hypertension: Secondary | ICD-10-CM

## 2019-03-12 LAB — CBC WITH DIFFERENTIAL/PLATELET
Abs Immature Granulocytes: 0.02 10*3/uL (ref 0.00–0.07)
Basophils Absolute: 0 10*3/uL (ref 0.0–0.1)
Basophils Relative: 0 %
Eosinophils Absolute: 0 10*3/uL (ref 0.0–0.5)
Eosinophils Relative: 0 %
HCT: 46.3 % — ABNORMAL HIGH (ref 36.0–46.0)
Hemoglobin: 15.2 g/dL — ABNORMAL HIGH (ref 12.0–15.0)
Immature Granulocytes: 0 %
Lymphocytes Relative: 9 %
Lymphs Abs: 0.6 10*3/uL — ABNORMAL LOW (ref 0.7–4.0)
MCH: 26.3 pg (ref 26.0–34.0)
MCHC: 32.8 g/dL (ref 30.0–36.0)
MCV: 80.2 fL (ref 80.0–100.0)
Monocytes Absolute: 0.6 10*3/uL (ref 0.1–1.0)
Monocytes Relative: 8 %
Neutro Abs: 5.4 10*3/uL (ref 1.7–7.7)
Neutrophils Relative %: 83 %
Platelets: 255 10*3/uL (ref 150–400)
RBC: 5.77 MIL/uL — ABNORMAL HIGH (ref 3.87–5.11)
RDW: 14.1 % (ref 11.5–15.5)
WBC: 6.6 10*3/uL (ref 4.0–10.5)
nRBC: 0 % (ref 0.0–0.2)

## 2019-03-12 LAB — COMPREHENSIVE METABOLIC PANEL
ALT: 37 U/L (ref 0–44)
AST: 63 U/L — ABNORMAL HIGH (ref 15–41)
Albumin: 3.2 g/dL — ABNORMAL LOW (ref 3.5–5.0)
Alkaline Phosphatase: 64 U/L (ref 38–126)
Anion gap: 12 (ref 5–15)
BUN: 13 mg/dL (ref 6–20)
CO2: 23 mmol/L (ref 22–32)
Calcium: 8.4 mg/dL — ABNORMAL LOW (ref 8.9–10.3)
Chloride: 104 mmol/L (ref 98–111)
Creatinine, Ser: 0.62 mg/dL (ref 0.44–1.00)
GFR calc Af Amer: >60 mL/min (ref >60)
GFR calc non Af Amer: >60 mL/min (ref >60)
Glucose, Bld: 119 mg/dL — ABNORMAL HIGH (ref 70–99)
Potassium: 4 mmol/L (ref 3.5–5.1)
Sodium: 139 mmol/L (ref 135–145)
Total Bilirubin: 1 mg/dL (ref 0.3–1.2)
Total Protein: 7.2 g/dL (ref 6.5–8.1)

## 2019-03-12 LAB — D-DIMER, QUANTITATIVE: D-Dimer, Quant: 8.22 ug/mL-FEU — ABNORMAL HIGH (ref 0.00–0.50)

## 2019-03-12 LAB — FERRITIN: Ferritin: 1259 ng/mL — ABNORMAL HIGH (ref 11–307)

## 2019-03-12 LAB — GLUCOSE, CAPILLARY
Glucose-Capillary: 151 mg/dL — ABNORMAL HIGH (ref 70–99)
Glucose-Capillary: 127 mg/dL — ABNORMAL HIGH (ref 70–99)

## 2019-03-12 LAB — C-REACTIVE PROTEIN: CRP: 7 mg/dL — ABNORMAL HIGH (ref <1.0)

## 2019-03-12 MED ORDER — SODIUM CHLORIDE 0.9% FLUSH
10.0000 mL | Freq: Two times a day (BID) | INTRAVENOUS | Status: DC
  Administered 2019-03-12 – 2019-03-25 (×21): 10 mL
  Administered 2019-03-25: 40 mL
  Administered 2019-03-26 – 2019-03-29 (×7): 10 mL
  Administered 2019-03-30: 20 mL
  Administered 2019-03-30 – 2019-04-12 (×18): 10 mL
  Administered 2019-04-13: 3 mL
  Administered 2019-04-13 – 2019-04-21 (×7): 10 mL

## 2019-03-12 MED ORDER — HYDRALAZINE HCL 20 MG/ML IJ SOLN
10.0000 mg | Freq: Four times a day (QID) | INTRAMUSCULAR | Status: DC | PRN
  Administered 2019-03-12 – 2019-03-18 (×4): 10 mg via INTRAVENOUS
  Filled 2019-03-12 (×10): qty 1

## 2019-03-12 MED ORDER — SODIUM CHLORIDE 0.9 % IV SOLN
INTRAVENOUS | Status: DC
  Administered 2019-03-12 – 2019-03-25 (×17): via INTRAVENOUS

## 2019-03-12 MED ORDER — SODIUM CHLORIDE 0.9% FLUSH
10.0000 mL | INTRAVENOUS | Status: DC | PRN
  Administered 2019-04-05: 10 mL

## 2019-03-12 MED ORDER — ENOXAPARIN SODIUM 40 MG/0.4ML ~~LOC~~ SOLN
40.0000 mg | Freq: Two times a day (BID) | SUBCUTANEOUS | Status: DC
  Administered 2019-03-12 – 2019-03-15 (×7): 40 mg via SUBCUTANEOUS
  Filled 2019-03-12 (×7): qty 0.4

## 2019-03-12 MED ORDER — LEVOTHYROXINE SODIUM 100 MCG/5ML IV SOLN
50.0000 ug | Freq: Every day | INTRAVENOUS | Status: DC
  Administered 2019-03-12 – 2019-03-30 (×19): 50 ug via INTRAVENOUS
  Filled 2019-03-12 (×20): qty 5

## 2019-03-12 MED ORDER — METOPROLOL TARTRATE 5 MG/5ML IV SOLN
2.5000 mg | Freq: Four times a day (QID) | INTRAVENOUS | Status: DC
  Administered 2019-03-12 – 2019-03-13 (×4): 2.5 mg via INTRAVENOUS
  Filled 2019-03-12 (×4): qty 5

## 2019-03-12 MED ORDER — SODIUM CHLORIDE 0.9 % IV SOLN
INTRAVENOUS | Status: DC | PRN
  Administered 2019-03-12: 1000 mL via INTRAVENOUS

## 2019-03-12 NOTE — Evaluation (Signed)
Clinical/Bedside Swallow Evaluation Patient Details  Name: Rebecca Bruce MRN: [id number] Date of Birth: 01/27/1961  Today's Date: 03/12/2019 Time: SLP Start Time (ACUTE ONLY): 80 SLP Stop Time (ACUTE ONLY): 1330 SLP Time Calculation (min) (ACUTE ONLY): 10 min  Past Medical History:  Past Medical History:  Diagnosis Date  . GERD (gastroesophageal reflux disease)   . Hypertension   . Thyroid disease    Past Surgical History:  Past Surgical History:  Procedure Laterality Date  . DENTAL SURGERY     HPI:  Patient is a 58 y.o. female with PMHx of hypothyroidism, HTN, intellectual disability-lives in a group home for the past 20 years (normally fairly independent and talks)-brought in to [facility] on 11/28 for altered mental status, poor oral intake-further evaluation revealed COVID-19 pneumonia.  Has been encephalopathic, alert but resists caregivers, does not interact   Assessment / Plan / Recommendation Clinical Impression  Pt is alert, visually tracks people in room and focuses gaze on PO. She does not initiate any movement in reponse to tactile cues with PO other than a quick head shake NO. When an ice chip was placed in left buccal cavity she let it spill anteriorally, did not initiate swallow, had some delayed coughing. Pt is not accepting PO, but suspect her swallow abilty is adequate when she is responsive and willing. Will keep diet order in place so staff can offer PO if pts mentation changes. Will f/u tomorrow for further attempts.  SLP Visit Diagnosis: Dysphagia, unspecified (R13.10)    Aspiration Risk  Risk for inadequate nutrition/hydration    Diet Recommendation Regular;Thin liquid   Liquid Administration via: Cup;Straw Medication Administration: Via alternative means Compensations: Slow rate;Small sips/bites;Minimize environmental distractions Postural Changes: Seated upright at 90 degrees    Other  Recommendations Oral Care Recommendations: Oral care BID   Follow  up Recommendations 24 hour supervision/assistance      Frequency and Duration min 2x/week  2 weeks       Prognosis Prognosis for Safe Diet Advancement: Fair Barriers to Reach Goals: Cognitive deficits      Swallow Study   General HPI: Patient is a 58 y.o. female with PMHx of hypothyroidism, HTN, intellectual disability-lives in a group home for the past 20 years (normally fairly independent and talks)-brought in to [facility] on 11/28 for altered mental status, poor oral intake-further evaluation revealed COVID-19 pneumonia.  Has been encephalopathic, alert but resists caregivers, does not interact Type of Study: Bedside Swallow Evaluation Previous Swallow Assessment: none Diet Prior to this Study: Regular;Thin liquids Temperature Spikes Noted: No Respiratory Status: Nasal cannula History of Recent Intubation: No Behavior/Cognition: Alert;Doesn't follow directions Oral Cavity Assessment: Excessive secretions Oral Care Completed by SLP: Yes Oral Cavity - Dentition: Adequate natural dentition Self-Feeding Abilities: Refused PO Patient Positioning: Upright in bed Baseline Vocal Quality: Not observed    Oral/Motor/Sensory Function Overall Oral Motor/Sensory Function: Other (comment)(does not follow commands)   Ice Chips Ice chips: Impaired Oral Phase Impairments: Poor awareness of bolus Oral Phase Functional Implications: Left anterior spillage;Left lateral sulci pocketing Pharyngeal Phase Impairments: Cough - Immediate   Thin Liquid Thin Liquid: Impaired Oral Phase Impairments: Poor awareness of bolus    Nectar Thick Nectar Thick Liquid: Not tested   Honey Thick Honey Thick Liquid: Not tested   Puree Puree: Not tested   Solid           [name], MA CCC-SLP  Acute Rehabilitation Services Pager [phone] Office [phone]  Kynan Peasley, Katherene Ponto 03/12/2019,1:38 PM

## 2019-03-12 NOTE — Progress Notes (Signed)
PROGRESS NOTE                                                                                                                                                                                                             Patient Demographics:    Rebecca Bruce, is a 58 y.o. female, DOB - 03/25/1961, ZJQ:[id number]  Outpatient Primary MD for the patient is Steele Sizer, MD   Admit date - 03/11/2019   LOS - 1  No chief complaint on file.      Brief Narrative: Patient is a 58 y.o. female with PMHx of hypothyroidism, HTN, intellectual disability-lives in a group home for the past 20 years (normally fairly independent and talks)-brought in to [facility] on 11/28 for altered mental status, poor oral intake-further evaluation revealed COVID-19 pneumonia.  Patient was subsequently admitted to the hospitalist service at [facility], and transferred to my service on 12/1 at [facility].   Subjective:    Rebecca Bruce today is awake-we will squeeze both my fingers with her hands but really does not follow commands otherwise.  She will track my movement.  She appears to be resisting by flexing both her upper extremities at the elbow-she appears to have very significant strength in both upper extremities-but very difficult given mental status.  Even with vigorous sternal rub-she will grunt-and look at me but will not verbalize.   Assessment  & Plan :   COVID-19 viral pneumonia: Remains stable on room air-afebrile now-CRP trending down-continue steroids and remdesivir.  Fever: afebrile  O2 requirements:  SpO2: 94 % on room air  COVID-19 Labs: Recent Labs    03/11/19 0449 03/12/19 0246  DDIMER  --  8.22*  FERRITIN 900* 1,259*  CRP 11.2* 7.0*    No results found for: BNP  Recent Labs  Lab 03/10/19 0527 03/11/19 0449  PROCALCITON <0.10 <0.10    Lab Results  Component Value Date   SARSCOV2NAA POSITIVE (A) 03/09/2019     COVID-19  Medications: Steroids: 11/29>> Remdesivir: 11/29>> Actemra: Not given due to lack of hypoxemia Convalescent Plasma: Not given  Other medications: Diuretics:Euvolemic-no need for lasix Antibiotics: Vancomycin: 11/28>> 12/1 Meropenem: 11/28>> CBG's stable on SSI while on steroid (A1c 6.1 on 11/28)  Prone/Incentive Spirometry: Doubt will be cooperative.  DVT Prophylaxis  :  Lovenox   Acute metabolic encephalopathy superimposed intellectual disability: No longer  agitated-but appears to be resisting-by clenching her hands/flexing her elbows.  CT head x2 on 11/27 and 11/28 negative for acute abnormalities.  Per documentation from prior physicians-MRI was not possible at [facility] due to agitation.  At this point I suspect her encephalopathy is probably from Covid 19-she was febrile till just the day before.  Examination is very difficult-but low suspicion for meningitis/encephalitis at this point.  Spoke with patient's sister-in-law-family would like to FaceTime-she responds very well to commands from her brother-continue remdesivir/steroids-if she continues to be encephalopathic in spite of improvement in her Covid pneumonia-she may need further work-up.  Sepsis: Probably secondary to pain-urine cultures are inconclusive-blood cultures negative.  Stop vancomycin-suspect we could just narrow down to Rocephin for now.  Hypothyroidism: No significant oral intake-change to IV levothyroxine  HTN: Uncontrolled-patient apparently not cooperative with oral intake-we will start scheduled IV Lopressor.  Dyslipidemia: Resume statins when able-when oral intake returns  Obesity: Estimated body mass index is 34.27 kg/m as calculated from the following:   Height as of 03/09/19: _0  (1.575 m).   Weight as of this encounter: 85 kg.   Consults  :  None  Procedures  :  None  ABG: No results found for: PHART, PCO2ART, PO2ART, HCO3, TCO2, ACIDBASEDEF, O2SAT  Vent Settings:    Condition - Stable   Family Communication  : Sister-in-law updated over the phone  Code Status :  Full Code/  Diet :  Diet Order            Diet Heart Room service appropriate? Yes; Fluid consistency: Thin  Diet effective now               Disposition Plan  :  Remain hospitalized  Barriers to discharge: complete 5 days of IV Remdesivir/needs improvement in mental status  Antimicorbials  :    Anti-infectives (From admission, onward)   Start     Dose/Rate Route Frequency Ordered Stop   03/12/19 1800  vancomycin (VANCOCIN) 1,750 mg in sodium chloride 0.9 % 500 mL IVPB  Status:  Discontinued     1,750 mg 250 mL/hr over 120 Minutes Intravenous Every 24 hours 03/11/19 1938 03/12/19 0903   03/12/19 1000  remdesivir 100 mg in sodium chloride 0.9 % 250 mL IVPB     100 mg 500 mL/hr over 30 Minutes Intravenous Every 24 hours 03/11/19 1943 03/15/19 0959   03/11/19 2200  meropenem (MERREM) 1 g in sodium chloride 0.9 % 100 mL IVPB     1 g 200 mL/hr over 30 Minutes Intravenous Every 8 hours 03/11/19 1938        Inpatient Medications  Scheduled Meds: . atorvastatin  20 mg Oral q1800  . dexamethasone  6 mg Intravenous Q24H  . docusate sodium  100 mg Oral Daily  . enoxaparin (LOVENOX) injection  40 mg Subcutaneous Q24H  . gabapentin  300 mg Oral QHS  . Ipratropium-Albuterol  1 puff Inhalation Q6H  . levothyroxine  100 mcg Oral QAC breakfast  . PARoxetine  30 mg Oral QHS  . saccharomyces boulardii  250 mg Oral Daily  . triamterene-hydrochlorothiazide  0.5 tablet Oral Daily  . vitamin C  500 mg Oral Daily  . zinc sulfate  220 mg Oral Daily   Continuous Infusions: . sodium chloride 1,000 mL (03/12/19 1041)  . meropenem (MERREM) 1 GM IVPB (Mini-Bag Plus) 1 g (03/12/19 0508)  . remdesivir 100 mg in NS 250 mL 100 mg (03/12/19 1042)   PRN Meds:.sodium chloride, acetaminophen, chlorpheniramine-HYDROcodone, guaiFENesin-dextromethorphan,  LORazepam   Time Spent in minutes  25  See all Orders from today for  further details   Oren Binet M.D on 03/12/2019 at 11:43 AM  To page go to [email or web address] - use universal password  [facility] -  Office  [phone]    Objective:   Vitals:   03/12/19 0400 03/12/19 0600 03/12/19 0710 03/12/19 0754  BP:  (!) 168/73  (!) 167/85  Pulse: 98 84  98  Resp: (!) 28 (!) 28  (!) 30  Temp:  99 F (37.2 C)  99.4 F (37.4 C)  TempSrc:    Oral  SpO2: 95% 96%  94%  Weight:   85 kg     Wt Readings from Last 3 Encounters:  03/12/19 85 kg  03/11/19 84.6 kg  03/08/19 90.7 kg     Intake/Output Summary (Last 24 hours) at 03/12/2019 1143 Last data filed at 03/12/2019 0600 Gross per 24 hour  Intake 200 ml  Output -  Net 200 ml     Physical Exam Gen Exam: Awake-tracks my movement-but is not cooperative.  Does not speak. HEENT:atraumatic, normocephalic Chest: B/L clear to auscultation anteriorly CVS:S1S2 regular Abdomen:soft non tender, non distended Extremities:no edema Neurology: Difficult exam-but squeezes my fingers very tightly-when I attempt to extend her elbows-she responds by flexing it further.  Does not appear to have any significant neurological deficits grossly. Skin: no rash   Data Review:    CBC Recent Labs  Lab 03/08/19 1252 03/09/19 1215 03/10/19 0527 03/11/19 0449 03/12/19 0246  WBC 6.8 6.4 4.7 4.0 6.6  HGB 14.6 14.9 13.9 14.2 15.2*  HCT 44.6 44.3 42.7 42.8 46.3*  PLT 217 223 206 198 255  MCV 80.5 78.0* 81.0 79.3* 80.2  MCH 26.4 26.2 26.4 26.3 26.3  MCHC 32.7 33.6 32.6 33.2 32.8  RDW 13.8 13.9 14.0 13.8 14.1  LYMPHSABS 0.7 0.8  --   --  0.6*  MONOABS 0.9 0.7  --   --  0.6  EOSABS 0.0 0.0  --   --  0.0  BASOSABS 0.0 0.0  --   --  0.0    Chemistries  Recent Labs  Lab 03/08/19 1252 03/09/19 1324 03/10/19 0527 03/11/19 0449 03/12/19 0246  NA 135 136 136 137 139  K 4.1 4.1 3.6 3.2* 4.0  CL 97* 98 100 103 104  CO2 _0 GLUCOSE 131* 117* 133* 138* 119*  BUN _1 CREATININE 0.89  0.68 0.64 0.49 0.62  CALCIUM 9.5 8.8* 8.2* 8.0* 8.4*  AST 38  --  53* 56* 63*  ALT 26  --  24 29 37  ALKPHOS 72  --  62 61 64  BILITOT 1.0  --  1.0 1.1 1.0   ------------------------------------------------------------------------------------------------------------------ No results for input(s): CHOL, HDL, LDLCALC, TRIG, CHOLHDL, LDLDIRECT in the last 72 hours.  Lab Results  Component Value Date   HGBA1C 6.1 (H) 03/09/2019   ------------------------------------------------------------------------------------------------------------------ Recent Labs    03/09/19 1324  TSH 3.243   ------------------------------------------------------------------------------------------------------------------ Recent Labs    03/11/19 0449 03/12/19 0246  FERRITIN 900* 1,259*    Coagulation profile No results for input(s): INR, PROTIME in the last 168 hours.  Recent Labs    03/12/19 0246  DDIMER 8.22*    Cardiac Enzymes No results for input(s): CKMB, TROPONINI, MYOGLOBIN in the last 168 hours.  Invalid input(s): CK ------------------------------------------------------------------------------------------------------------------ No results found for: BNP  Micro Results Recent Results (from the past 240 hour(s))  Urine  culture     Status: Abnormal   Collection Time: 03/08/19  2:33 PM   Specimen: Urine, Clean Catch  Result Value Ref Range Status   Specimen Description   Final    URINE, CLEAN CATCH Performed at [facility], [address] [place], [place] [address]    Special Requests   Final    NONE Performed at [facility], [address] [place], [place] [address]    Culture MULTIPLE SPECIES PRESENT, SUGGEST RECOLLECTION (A)  Final   Report Status 03/11/2019 FINAL  Final  SARS CORONAVIRUS 2 (TAT 6-24 HRS) Nasopharyngeal Nasopharyngeal Swab     Status: Abnormal   Collection Time: 03/09/19  1:24 PM   Specimen: Nasopharyngeal Swab  Result Value Ref Range  Status   SARS Coronavirus 2 POSITIVE (A) NEGATIVE Final    Comment: RESULT CALLED TO, READ BACK BY AND VERIFIED WITH: RN MAC BURNS AT 0059 ON 03/10/2019 BY MESSAN HOUEGNIFIO (NOTE) SARS-CoV-2 target nucleic acids are DETECTED. The SARS-CoV-2 RNA is generally detectable in upper and lower respiratory specimens during the acute phase of infection. Positive results are indicative of the presence of SARS-CoV-2 RNA. Clinical correlation with patient history and other diagnostic information is  necessary to determine patient infection status. Positive results do not rule out bacterial infection or co-infection with other viruses.  The expected result is Negative. Fact Sheet for Patients: SugarRoll.be Fact Sheet for Healthcare Providers: https://www.woods-mathews.com/ This test is not yet approved or cleared by the [place] FDA and  has been authorized for detection and/or diagnosis of SARS-CoV-2 by FDA under an Emergency Use Authorization (EUA). This EUA will remain  in effect (meaning this test  can be used) for the duration of the COVID-19 declaration under Section 564(b)(1) of the Act, 21 U.S.C. section 360bbb-3(b)(1), unless the authorization is terminated or revoked sooner. Performed at [facility], [address], [place], [place] [address]   Culture, blood (x 2)     Status: None (Preliminary result)   Collection Time: 03/09/19  3:17 PM   Specimen: BLOOD  Result Value Ref Range Status   Specimen Description BLOOD LEFT ANTECUBITAL  Final   Special Requests   Final    BOTTLES DRAWN AEROBIC AND ANAEROBIC Blood Culture adequate volume   Culture   Final    NO GROWTH 3 DAYS Performed at [facility], [address], [place], [place] [address]    Report Status PENDING  Incomplete    Radiology Reports Ct Head Wo Contrast  Result Date: 03/09/2019 CLINICAL DATA:  Altered mental status EXAM: CT HEAD WITHOUT CONTRAST  TECHNIQUE: Contiguous axial images were obtained from the base of the skull through the vertex without intravenous contrast. COMPARISON:  None. FINDINGS: Brain: The ventricles are normal in size and configuration. There is no intracranial mass, hemorrhage, extra-axial fluid collection, or midline shift. The brain parenchyma appears unremarkable. No acute infarct evident. Vascular: No hyperdense vessel. There is no appreciable vascular calcification. Skull: The bony calvarium appears intact. Sinuses/Orbits: There is mucosal thickening in several ethmoid air cells. Other visualized paranasal sinuses are clear. Visualized orbits appear symmetric bilaterally. Other: Mastoid air cells are clear. IMPRESSION: Mucosal thickening in several ethmoid air cells. Study otherwise unremarkable. Electronically Signed   By: Lowella Grip III M.D.   On: 03/09/2019 13:20   Ct Head Wo Contrast  Result Date: 03/08/2019 CLINICAL DATA:  Altered level of consciousness.  Hypertension. EXAM: CT HEAD WITHOUT CONTRAST TECHNIQUE: Contiguous axial images were obtained from  the base of the skull through the vertex without intravenous contrast. COMPARISON:  None. FINDINGS: Brain: The ventricles are normal in size and configuration. There is no intracranial mass, hemorrhage, extra-axial fluid collection, or midline shift. The brain parenchyma appears unremarkable. There is no evident acute infarct. Vascular: There is no hyperdense vessel. There is no appreciable vascular calcification. Skull: The bony calvarium appears intact. Sinuses/Orbits: There is mild mucosal thickening in several ethmoid air cells. Other visualized paranasal sinuses are clear. Visualized orbits appear symmetric bilaterally. Other: Visualized mastoid air cells are clear. IMPRESSION: Mild mucosal thickening in several ethmoid air cells. Study otherwise unremarkable. Electronically Signed   By: Lowella Grip III M.D.   On: 03/08/2019 14:07   Dg Chest Port 1 View   Result Date: 03/10/2019 CLINICAL DATA:  Acute mental status change. EXAM: PORTABLE CHEST 1 VIEW COMPARISON:  June 10, 2005 FINDINGS: No pneumothorax. The heart size is borderline to mildly enlarged but poorly assessed on a portable film. Hila and mediastinum are unremarkable. Bilateral patchy pulmonary opacities are identified. No other acute abnormalities. IMPRESSION: 1. Bilateral patchy pulmonary infiltrates worrisome for pneumonia. Atypical infections should be considered. 2. No other acute abnormalities are identified. Electronically Signed   By: Dorise Bullion III M.D   On: 03/10/2019 14:56   [place] Ekg Site Rite  Result Date: 03/12/2019 If Site Rite image not attached, placement could not be confirmed due to current cardiac rhythm.

## 2019-03-12 NOTE — Progress Notes (Signed)
Attempted phone call with bother to see if we could get a response from patient and encourage participation, patient opened eyes but would not respond to brother.

## 2019-03-12 NOTE — Progress Notes (Addendum)
   03/12/19 0358  MEWS Assessment  Is this an acute change? Yes  MEWS guidelines implemented *See Row Information* Yellow  Provider Notification  Provider Name/Title Peyton Bottoms  Date Provider Notified 03/12/19  Time Provider Notified 0358  Notification Type Page  Notification Reason Change in status (mews yellow)     Vital Signs MEWS/VS Documentation      03/12/2019 0300 03/12/2019 0339 03/12/2019 0345 03/12/2019 0400   MEWS Score:  2  3  2  2    MEWS Score Color:  Yellow  Yellow  Yellow  Yellow   Resp:  (!) 32  (!) 31  (!) 24  (!) 28   Pulse:  97  (!) 106  -  98   BP:  (!) 171/93  (!) 163/76  -  -   Temp:  -  99.1 F (37.3 C)  -  -   O2 Device:  -  Room [employer]  -           Duanne Limerick Layne 03/12/2019,4:08 AM

## 2019-03-12 NOTE — Progress Notes (Signed)
Attempted to reach RN in regards to PICC placement without success. Will attempt again later.

## 2019-03-13 DIAGNOSIS — G92 Toxic encephalopathy: Secondary | ICD-10-CM

## 2019-03-13 LAB — COMPREHENSIVE METABOLIC PANEL
ALT: 41 U/L (ref 0–44)
AST: 62 U/L — ABNORMAL HIGH (ref 15–41)
Albumin: 3.1 g/dL — ABNORMAL LOW (ref 3.5–5.0)
Alkaline Phosphatase: 64 U/L (ref 38–126)
Anion gap: 11 (ref 5–15)
BUN: 16 mg/dL (ref 6–20)
CO2: 22 mmol/L (ref 22–32)
Calcium: 8.3 mg/dL — ABNORMAL LOW (ref 8.9–10.3)
Chloride: 109 mmol/L (ref 98–111)
Creatinine, Ser: 0.61 mg/dL (ref 0.44–1.00)
GFR calc Af Amer: >60 mL/min (ref >60)
GFR calc non Af Amer: >60 mL/min (ref >60)
Glucose, Bld: 94 mg/dL (ref 70–99)
Potassium: 4 mmol/L (ref 3.5–5.1)
Sodium: 142 mmol/L (ref 135–145)
Total Bilirubin: 1.2 mg/dL (ref 0.3–1.2)
Total Protein: 6.8 g/dL (ref 6.5–8.1)

## 2019-03-13 LAB — CBC WITH DIFFERENTIAL/PLATELET
Abs Immature Granulocytes: 0.11 10*3/uL — ABNORMAL HIGH (ref 0.00–0.07)
Basophils Absolute: 0 10*3/uL (ref 0.0–0.1)
Basophils Relative: 1 %
Eosinophils Absolute: 0 10*3/uL (ref 0.0–0.5)
Eosinophils Relative: 0 %
HCT: 47.3 % — ABNORMAL HIGH (ref 36.0–46.0)
Hemoglobin: 15.3 g/dL — ABNORMAL HIGH (ref 12.0–15.0)
Immature Granulocytes: 1 %
Lymphocytes Relative: 9 %
Lymphs Abs: 0.8 10*3/uL (ref 0.7–4.0)
MCH: 26.4 pg (ref 26.0–34.0)
MCHC: 32.3 g/dL (ref 30.0–36.0)
MCV: 81.6 fL (ref 80.0–100.0)
Monocytes Absolute: 1 10*3/uL (ref 0.1–1.0)
Monocytes Relative: 12 %
Neutro Abs: 6.6 10*3/uL (ref 1.7–7.7)
Neutrophils Relative %: 77 %
Platelets: 297 10*3/uL (ref 150–400)
RBC: 5.8 MIL/uL — ABNORMAL HIGH (ref 3.87–5.11)
RDW: 14.4 % (ref 11.5–15.5)
WBC: 8.6 10*3/uL (ref 4.0–10.5)
nRBC: 0 % (ref 0.0–0.2)

## 2019-03-13 LAB — D-DIMER, QUANTITATIVE: D-Dimer, Quant: 7.28 ug/mL-FEU — ABNORMAL HIGH (ref 0.00–0.50)

## 2019-03-13 LAB — C-REACTIVE PROTEIN: CRP: 4.1 mg/dL — ABNORMAL HIGH (ref <1.0)

## 2019-03-13 LAB — CK: Total CK: 362 U/L — ABNORMAL HIGH (ref 38–234)

## 2019-03-13 LAB — FERRITIN: Ferritin: 1114 ng/mL — ABNORMAL HIGH (ref 11–307)

## 2019-03-13 MED ORDER — VANCOMYCIN HCL 10 G IV SOLR
2000.0000 mg | Freq: Once | INTRAVENOUS | Status: AC
  Administered 2019-03-13: 2000 mg via INTRAVENOUS
  Filled 2019-03-13: qty 2000

## 2019-03-13 MED ORDER — METOPROLOL TARTRATE 5 MG/5ML IV SOLN
5.0000 mg | Freq: Four times a day (QID) | INTRAVENOUS | Status: DC
  Administered 2019-03-13 – 2019-03-19 (×16): 5 mg via INTRAVENOUS
  Filled 2019-03-13 (×17): qty 5

## 2019-03-13 MED ORDER — VANCOMYCIN HCL IN DEXTROSE 1-5 GM/200ML-% IV SOLN
1000.0000 mg | Freq: Two times a day (BID) | INTRAVENOUS | Status: AC
  Administered 2019-03-14 – 2019-03-18 (×10): 1000 mg via INTRAVENOUS
  Filled 2019-03-13 (×11): qty 200

## 2019-03-13 MED ORDER — LORAZEPAM 2 MG/ML IJ SOLN
1.0000 mg | Freq: Three times a day (TID) | INTRAMUSCULAR | Status: DC
  Administered 2019-03-13 – 2019-03-14 (×2): 1 mg via INTRAVENOUS
  Filled 2019-03-13 (×2): qty 1

## 2019-03-13 MED ORDER — SODIUM CHLORIDE 0.9 % IV SOLN
2.0000 g | Freq: Two times a day (BID) | INTRAVENOUS | Status: AC
  Administered 2019-03-13 – 2019-03-18 (×11): 2 g via INTRAVENOUS
  Filled 2019-03-13 (×11): qty 20

## 2019-03-13 NOTE — Progress Notes (Signed)
Paged MD about RR being in mid 30s and BP of 170 even when given IV Metoprolol and PRN hydralazine. Will continue to monitor.

## 2019-03-13 NOTE — Progress Notes (Signed)
Physical Therapy Treatment Patient Details Name: Rebecca Bruce MRN: [id number] DOB: 22-May-1960 Today's Date: 03/13/2019    History of Present Illness 58 y.o. female with PMHx of hypothyroidism, HTN, intellectual disability-lives in a group home for the past 20 years (normally fairly independent and talks)-brought in to [facility] on 11/28 for altered mental status, poor oral intake-further evaluation revealed COVID-19 pneumonia.     PT Comments    The patient presents with extensor tone of both LE's. Attempts made to break the Tonal patterns of legs. Sat patient in chair position and assisted with trunk flexion. Left knee did relax and flexed a little, right leg in constant extension at the knee. Continue with positioning, and PROM as able to decrease tone.    Follow Up Recommendations  SNF     Equipment Recommendations  Wheelchair cushion (measurements PT);Wheelchair (measurements PT)    Recommendations for Other Services       Precautions / Restrictions Precautions Precautions: Fall;Other (comment)    Mobility  Bed Mobility Overal bed mobility: Needs Assistance Bed Mobility: Rolling Rolling: +2 for physical assistance;+2 for safety/equipment;Total assist         General bed mobility comments: placwed on upright bed chair position and worked on leaning forward x 3 with total assist. increased tom=ne felt intrunk and legs  Transfers                 General transfer comment: NA, Will need Maximove  Ambulation/Gait                 Stairs             Wheelchair Mobility    Modified Rankin (Stroke Patients Only)       Balance Overall balance assessment: Needs assistance   Sitting balance-Leahy Scale: Zero                                      Cognition Arousal/Alertness: Awake/alert Behavior During Therapy: Flat affect Overall Cognitive Status: Impaired/Different from baseline                                 General Comments: Not following commands; looks at therapist when her name is called; tracks therapist with eyes, not head turning,but difficulty sustaining visual attention; alertness apppeared to improve when music played      Exercises      General Comments        Pertinent Vitals/Pain Pain Assessment: Faces Faces Pain Scale: Hurts little more Pain Location: With PROM Pain Descriptors / Indicators: Grimacing;Guarding Pain Intervention(s): Monitored during session    Home Living Family/patient expects to be discharged to:: Skilled nursing facility                    Prior Function Level of Independence: Independent      Comments: Pt walked without an AD; able to bath and dress herself; pt loves to vacuum; sweep; clean adn dance; has a boyfriend named Vicente Serene   PT Goals (current goals can now be found in the care plan section) Acute Rehab PT Goals Patient Stated Goal: pt unable PT Goal Formulation: Patient unable to participate in goal setting Time For Goal Achievement: 03/27/19 Potential to Achieve Goals: Fair Progress towards PT goals: Progressing toward goals    Frequency    Min 2X/week  PT Plan Current plan remains appropriate    Co-evaluation PT/OT/SLP Co-Evaluation/Treatment: Yes Reason for Co-Treatment: Complexity of the patient's impairments (multi-system involvement);For patient/therapist safety PT goals addressed during session: Mobility/safety with mobility OT goals addressed during session: ADL's and self-care      AM-PAC PT "6 Clicks" Mobility   Outcome Measure  Help needed turning from your back to your side while in a flat bed without using bedrails?: Total Help needed moving from lying on your back to sitting on the side of a flat bed without using bedrails?: Total Help needed moving to and from a bed to a chair (including a wheelchair)?: Total Help needed standing up from a chair using your arms (e.g., wheelchair or bedside  chair)?: Total Help needed to walk in hospital room?: Total Help needed climbing 3-5 steps with a railing? : Total 6 Click Score: 6    End of Session   Activity Tolerance: Patient tolerated treatment well Patient left: in bed;with call bell/phone within reach;with nursing/sitter in room Nurse Communication: Mobility status;Need for lift equipment PT Visit Diagnosis: Other symptoms and signs involving the nervous system [id number])     Time: [id number] PT Time Calculation (min) (ACUTE ONLY): 44 min  Charges:  $Therapeutic Activity: 8-22 mins                     Tresa Endo PT Acute Rehabilitation Services  Office [phone]    [name] 03/13/2019, 2:04 PM

## 2019-03-13 NOTE — Progress Notes (Signed)
  Speech Language Pathology Treatment: Dysphagia  Patient Details Name: Rebecca Bruce MRN: [id number] DOB: 1960/11/26 Today's Date: 03/13/2019 Time: [id number] SLP Time Calculation (min) (ACUTE ONLY): 27 min  Assessment / Plan / Recommendation Clinical Impression  Pt is slightly more responsive today. With basic Y/N questions with intonation pt very consistently nodded yes, and once phonated "mmhmm" to social question. Initially she was completely unresponsive to total assist cueing with PO, but later when offered an ice chip from a cup pt opened her lips and sucked on the ice over 4 trials. There was still severe oral holding and constant verbal and tactile cues were needed to elicit occasional swallows. Able to progress to cup sips of coke, but duration of oral holding eventually increased and cueing and manipulation stopped being effective. Fortunately, pt is becoming more responsive, more attentive to PO and starting to demonstrate minimal intake. Will change diet to full liquids so that staff can continue to offer and attempt sips.   HPI HPI: Patient is a 58 y.o. female with PMHx of hypothyroidism, HTN, intellectual disability-lives in a group home for the past 20 years (normally fairly independent and talks)-brought in to [facility] on 11/28 for altered mental status, poor oral intake-further evaluation revealed COVID-19 pneumonia.  Has been encephalopathic, alert but resists caregivers, does not interact      SLP Plan  Continue with current plan of care       Recommendations  Diet recommendations: Thin liquid Liquids provided via: Cup Medication Administration: Via alternative means Supervision: Full supervision/cueing for compensatory strategies;Staff to assist with self feeding Compensations: Slow rate;Small sips/bites Postural Changes and/or Swallow Maneuvers: Seated upright 90 degrees                Follow up Recommendations: 24 hour supervision/assistance SLP Visit  Diagnosis: Dysphagia, unspecified (R13.10) Plan: Continue with current plan of care       GO               Rebecca Baltimore, MA [place] Pager [phone] Office [phone]  Rebecca Bruce 03/13/2019, 1:52 PM

## 2019-03-13 NOTE — Progress Notes (Signed)
PROGRESS NOTE                                                                                                                                                                                                             Patient Demographics:    Rebecca Bruce, is a 58 y.o. female, DOB - May 12, 1960, ELF:[id number]  Outpatient Primary MD for the patient is Steele Sizer, MD   Admit date - 03/11/2019   LOS - 2  No chief complaint on file.      Brief Narrative: Patient is a 58 y.o. female with PMHx of hypothyroidism, HTN, intellectual disability-lives in a group home for the past 20 years (normally fairly independent and talks)-brought in to [facility] on 11/28 for altered mental status, poor oral intake-further evaluation revealed COVID-19 pneumonia.  Patient was subsequently admitted to the hospitalist service at [facility], and transferred to my service on 12/1 at [facility].   Subjective:    Rebecca Bruce today appears slightly more awake and alert compared to yesterday.  She is still tracking my movement.  She does not verbalize.  She still has not had any oral intake for the past few days.  She is not on O2 and otherwise looks comfortable.   Assessment  & Plan :   COVID-19 viral pneumonia: Afebrile-remains on room air-CRP trending down.  Remains on steroids and remdesivir.    Fever: afebrile  O2 requirements:  SpO2: 99 % on room air  COVID-19 Labs: Recent Labs    03/11/19 0449 03/12/19 0246 03/13/19 0100  DDIMER  --  8.22* 7.28*  FERRITIN 900* 1,259* 1,114*  CRP 11.2* 7.0* 4.1*    No results found for: BNP  Recent Labs  Lab 03/10/19 0527 03/11/19 0449  PROCALCITON <0.10 <0.10    Lab Results  Component Value Date   SARSCOV2NAA POSITIVE (A) 03/09/2019     COVID-19 Medications: Steroids: 11/29>> Remdesivir: 11/29>> Actemra: Not given due to lack of hypoxemia Convalescent Plasma: Not given  Other medications:  Diuretics:Euvolemic-no need for lasix Antibiotics: Vancomycin: 11/28>> 12/1 Meropenem: 11/28>> CBG's stable on SSI while on steroid (A1c 6.1 on 11/28)  Prone/Incentive Spirometry: Doubt will be cooperative.  DVT Prophylaxis  :  Lovenox   Acute metabolic encephalopathy superimposed intellectual disability: No longer agitated-appears comfortable-yesterday she seemed to be resisting my exam and was clenching hands/flexing her elbows-this  morning since she is a little bit more comfortable-and allows a better exam-she appears to have generalized rigidity.  Check CK-she has not received Paxil for the past several days-I have discontinued this anyway.  I suspect she needs a MRI-and neurology evaluation.  Differentials are broad but suspect this is a behavioral/psych issue-but then this is a diagnosis of exclusion-and other etiologies like an MS/serotonin syndrome/CNS infection may need to be ruled out.  I have spoken with Dr. Brynda Greathouse on-call-we will try to get her to [facility] hospital for an MRI and neurology evaluation.  Sepsis: Probably secondary to COVID-19-urine cultures are inconclusive-blood cultures negative.  No longer on vancomycin-we will continue meropenem till we get an MRI and neurology evaluation.    Hypothyroidism: No significant oral intake-continue IV levothyroxine-switch back to oral when oral intake resumes.  HTN: Uncontrolled-increase Lopressor to 5 mg IV every 6 patient apparently not cooperative with oral intake-we will start scheduled IV Lopressor.  Dyslipidemia: Resume statins when able-when oral intake returns  Obesity: Estimated body mass index is 34.27 kg/m as calculated from the following:   Height as of this encounter: _0  (1.575 m).   Weight as of this encounter: 85 kg.   Consults  :  None  Procedures  :  None  ABG: No results found for: PHART, PCO2ART, PO2ART, HCO3, TCO2, ACIDBASEDEF, O2SAT  Vent Settings:    Condition - Stable  Family  Communication  : Sister-in-law updated over the phone on 12/1  Code Status :  Full Code/  Diet :  Diet Order            Diet Heart Room service appropriate? Yes; Fluid consistency: Thin  Diet effective now               Disposition Plan  :  Remain hospitalized-potential transfer to [facility] pending bed availability  Barriers to discharge: complete 5 days of IV Remdesivir/needs improvement in mental status  Antimicorbials  :    Anti-infectives (From admission, onward)   Start     Dose/Rate Route Frequency Ordered Stop   03/12/19 1800  vancomycin (VANCOCIN) 1,750 mg in sodium chloride 0.9 % 500 mL IVPB  Status:  Discontinued     1,750 mg 250 mL/hr over 120 Minutes Intravenous Every 24 hours 03/11/19 1938 03/12/19 0903   03/12/19 1000  remdesivir 100 mg in sodium chloride 0.9 % 250 mL IVPB     100 mg 500 mL/hr over 30 Minutes Intravenous Every 24 hours 03/11/19 1943 03/15/19 0959   03/11/19 2200  meropenem (MERREM) 1 g in sodium chloride 0.9 % 100 mL IVPB     1 g 200 mL/hr over 30 Minutes Intravenous Every 8 hours 03/11/19 1938        Inpatient Medications  Scheduled Meds: . atorvastatin  20 mg Oral q1800  . dexamethasone  6 mg Intravenous Q24H  . docusate sodium  100 mg Oral Daily  . enoxaparin (LOVENOX) injection  40 mg Subcutaneous Q12H  . Ipratropium-Albuterol  1 puff Inhalation Q6H  . levothyroxine  50 mcg Intravenous Daily  . metoprolol tartrate  5 mg Intravenous Q6H  . saccharomyces boulardii  250 mg Oral Daily  . sodium chloride flush  10-40 mL Intracatheter Q12H  . vitamin C  500 mg Oral Daily  . zinc sulfate  220 mg Oral Daily   Continuous Infusions: . sodium chloride Stopped (03/12/19 1210)  . sodium chloride 75 mL/hr at 03/12/19 2102  . meropenem (MERREM) 1 GM IVPB (Mini-Bag  Plus) 1 g (03/13/19 8889)  . remdesivir 100 mg in NS 250 mL Stopped (03/12/19 1112)   PRN Meds:.sodium chloride, acetaminophen, chlorpheniramine-HYDROcodone,  guaiFENesin-dextromethorphan, hydrALAZINE, LORazepam, sodium chloride flush   Time Spent in minutes  25  See all Orders from today for further details   Oren Binet M.D on 03/13/2019 at 10:27 AM  To page go to [email or web address] - use universal password  [facility] -  Office  [phone]    Objective:   Vitals:   03/13/19 0000 03/13/19 0211 03/13/19 0402 03/13/19 0822  BP: (!) 187/93 (!) 179/86 (!) 164/83 (!) 169/81  Pulse: 94 98 100 90  Resp:   20 20  Temp:   98.9 F (37.2 C) 99.1 F (37.3 C)  TempSrc:   Oral Axillary  SpO2: 98% 99% 97% 99%  Weight:      Height:        Wt Readings from Last 3 Encounters:  03/12/19 85 kg  03/11/19 84.6 kg  03/08/19 90.7 kg     Intake/Output Summary (Last 24 hours) at 03/13/2019 1027 Last data filed at 03/13/2019 2800 Gross per 24 hour  Intake 1186.39 ml  Output 950 ml  Net 236.39 ml     Physical Exam Gen Exam: Awake-tracks my movement but does not follow commands, nonverbal. HEENT:atraumatic, normocephalic Chest: B/L clear to auscultation anteriorly CVS:S1S2 regular Abdomen:soft non tender, non distended Extremities:no edema Neurology: Very difficult exam-but appears to have generalized rigidity all over this morning. Skin: no rash   Data Review:    CBC Recent Labs  Lab 03/08/19 1252 03/09/19 1215 03/10/19 0527 03/11/19 0449 03/12/19 0246 03/13/19 0100  WBC 6.8 6.4 4.7 4.0 6.6 8.6  HGB 14.6 14.9 13.9 14.2 15.2* 15.3*  HCT 44.6 44.3 42.7 42.8 46.3* 47.3*  PLT 217 223 206 198 255 297  MCV 80.5 78.0* 81.0 79.3* 80.2 81.6  MCH 26.4 26.2 26.4 26.3 26.3 26.4  MCHC 32.7 33.6 32.6 33.2 32.8 32.3  RDW 13.8 13.9 14.0 13.8 14.1 14.4  LYMPHSABS 0.7 0.8  --   --  0.6* 0.8  MONOABS 0.9 0.7  --   --  0.6 1.0  EOSABS 0.0 0.0  --   --  0.0 0.0  BASOSABS 0.0 0.0  --   --  0.0 0.0    Chemistries  Recent Labs  Lab 03/08/19 1252 03/09/19 1324 03/10/19 0527 03/11/19 0449 03/12/19 0246 03/13/19 0100  NA 135 136  136 137 139 142  K 4.1 4.1 3.6 3.2* 4.0 4.0  CL 97* 98 100 103 104 109  CO2 _0 GLUCOSE 131* 117* 133* 138* 119* 94  BUN _1 CREATININE 0.89 0.68 0.64 0.49 0.62 0.61  CALCIUM 9.5 8.8* 8.2* 8.0* 8.4* 8.3*  AST 38  --  53* 56* 63* 62*  ALT 26  --  24 29 37 41  ALKPHOS 72  --  62 61 64 64  BILITOT 1.0  --  1.0 1.1 1.0 1.2   ------------------------------------------------------------------------------------------------------------------ No results for input(s): CHOL, HDL, LDLCALC, TRIG, CHOLHDL, LDLDIRECT in the last 72 hours.  Lab Results  Component Value Date   HGBA1C 6.1 (H) 03/09/2019   ------------------------------------------------------------------------------------------------------------------ No results for input(s): TSH, T4TOTAL, T3FREE, THYROIDAB in the last 72 hours.  Invalid input(s): FREET3 ------------------------------------------------------------------------------------------------------------------ Recent Labs    03/12/19 0246 03/13/19 0100  FERRITIN 1,259* 1,114*    Coagulation profile No results for input(s): INR, PROTIME in the last 168 hours.  Recent Labs    03/12/19 0246 03/13/19 0100  DDIMER 8.22* 7.28*    Cardiac Enzymes No results for input(s): CKMB, TROPONINI, MYOGLOBIN in the last 168 hours.  Invalid input(s): CK ------------------------------------------------------------------------------------------------------------------ No results found for: BNP  Micro Results Recent Results (from the past 240 hour(s))  Urine culture     Status: Abnormal   Collection Time: 03/08/19  2:33 PM   Specimen: Urine, Clean Catch  Result Value Ref Range Status   Specimen Description   Final    URINE, CLEAN CATCH Performed at [facility], [address] [place], [place] [address]    Special Requests   Final    NONE Performed at [facility], [address] [place], [place] [address]    Culture  MULTIPLE SPECIES PRESENT, SUGGEST RECOLLECTION (A)  Final   Report Status 03/11/2019 FINAL  Final  SARS CORONAVIRUS 2 (TAT 6-24 HRS) Nasopharyngeal Nasopharyngeal Swab     Status: Abnormal   Collection Time: 03/09/19  1:24 PM   Specimen: Nasopharyngeal Swab  Result Value Ref Range Status   SARS Coronavirus 2 POSITIVE (A) NEGATIVE Final    Comment: RESULT CALLED TO, READ BACK BY AND VERIFIED WITH: RN MAC BURNS AT 0059 ON 03/10/2019 BY MESSAN HOUEGNIFIO (NOTE) SARS-CoV-2 target nucleic acids are DETECTED. The SARS-CoV-2 RNA is generally detectable in upper and lower respiratory specimens during the acute phase of infection. Positive results are indicative of the presence of SARS-CoV-2 RNA. Clinical correlation with patient history and other diagnostic information is  necessary to determine patient infection status. Positive results do not rule out bacterial infection or co-infection with other viruses.  The expected result is Negative. Fact Sheet for Patients: SugarRoll.be Fact Sheet for Healthcare Providers: https://www.woods-mathews.com/ This test is not yet approved or cleared by the [place] FDA and  has been authorized for detection and/or diagnosis of SARS-CoV-2 by FDA under an Emergency Use Authorization (EUA). This EUA will remain  in effect (meaning this test  can be used) for the duration of the COVID-19 declaration under Section 564(b)(1) of the Act, 21 U.S.C. section 360bbb-3(b)(1), unless the authorization is terminated or revoked sooner. Performed at [facility], [place] [address], [place], [place] [address]   Culture, blood (x 2)     Status: None (Preliminary result)   Collection Time: 03/09/19  3:17 PM   Specimen: BLOOD  Result Value Ref Range Status   Specimen Description BLOOD LEFT ANTECUBITAL  Final   Special Requests   Final    BOTTLES DRAWN AEROBIC AND ANAEROBIC Blood Culture adequate volume   Culture   Final     NO GROWTH 3 DAYS Performed at [facility], [address] [place], [place] [address]    Report Status PENDING  Incomplete    Radiology Reports Ct Head Wo Contrast  Result Date: 03/09/2019 CLINICAL DATA:  Altered mental status EXAM: CT HEAD WITHOUT CONTRAST TECHNIQUE: Contiguous axial images were obtained from the base of the skull through the vertex without intravenous contrast. COMPARISON:  None. FINDINGS: Brain: The ventricles are normal in size and configuration. There is no intracranial mass, hemorrhage, extra-axial fluid collection, or midline shift. The brain parenchyma appears unremarkable. No acute infarct evident. Vascular: No hyperdense vessel. There is no appreciable vascular calcification. Skull: The bony calvarium appears intact. Sinuses/Orbits: There is mucosal thickening in several ethmoid air cells. Other visualized paranasal sinuses are clear. Visualized orbits appear symmetric bilaterally. Other: Mastoid air cells are clear. IMPRESSION: Mucosal thickening in several  ethmoid air cells. Study otherwise unremarkable. Electronically Signed   By: Lowella Grip III M.D.   On: 03/09/2019 13:20   Ct Head Wo Contrast  Result Date: 03/08/2019 CLINICAL DATA:  Altered level of consciousness.  Hypertension. EXAM: CT HEAD WITHOUT CONTRAST TECHNIQUE: Contiguous axial images were obtained from the base of the skull through the vertex without intravenous contrast. COMPARISON:  None. FINDINGS: Brain: The ventricles are normal in size and configuration. There is no intracranial mass, hemorrhage, extra-axial fluid collection, or midline shift. The brain parenchyma appears unremarkable. There is no evident acute infarct. Vascular: There is no hyperdense vessel. There is no appreciable vascular calcification. Skull: The bony calvarium appears intact. Sinuses/Orbits: There is mild mucosal thickening in several ethmoid air cells. Other visualized paranasal sinuses are clear. Visualized  orbits appear symmetric bilaterally. Other: Visualized mastoid air cells are clear. IMPRESSION: Mild mucosal thickening in several ethmoid air cells. Study otherwise unremarkable. Electronically Signed   By: Lowella Grip III M.D.   On: 03/08/2019 14:07   Dg Chest Port 1 View  Result Date: 03/10/2019 CLINICAL DATA:  Acute mental status change. EXAM: PORTABLE CHEST 1 VIEW COMPARISON:  June 10, 2005 FINDINGS: No pneumothorax. The heart size is borderline to mildly enlarged but poorly assessed on a portable film. Hila and mediastinum are unremarkable. Bilateral patchy pulmonary opacities are identified. No other acute abnormalities. IMPRESSION: 1. Bilateral patchy pulmonary infiltrates worrisome for pneumonia. Atypical infections should be considered. 2. No other acute abnormalities are identified. Electronically Signed   By: Dorise Bullion III M.D   On: 03/10/2019 14:56   [place] Ekg Site Rite  Result Date: 03/12/2019 If Site Rite image not attached, placement could not be confirmed due to current cardiac rhythm.

## 2019-03-13 NOTE — Progress Notes (Signed)
Pharmacy Antibiotic Note  Kadedra Sciarra is a 58 y.o. female admitted on 03/11/2019 with COVID-19.  Pharmacy has been consulted for Vancomycin dosing for possible meningitis.  Per TRH/Neuro, there is some concern for meningitis, but low probability.  Treat for a total of 10 days as if meningitis, since it will be very difficult to do a spinal tap.  Plan: Ceftriaxone 2g IV q12h Vancomycin 2g IV x1 followed by 1g IV q12h. Goal AUC > 500 and Cmin 15-20 mcg/mL for meningitis. Follow up renal function, culture results, and clinical course.  Height: 5\' 2"  (157.5 cm) Weight: 187 lb 6.3 oz (85 kg) IBW/kg (Calculated) : 50.1  Temp (24hrs), Avg:98.7 F (37.1 C), Min:98.3 F (36.8 C), Max:99.1 F (37.3 C)  Recent Labs  Lab 03/09/19 1215 03/09/19 1324 03/09/19 1517 03/09/19 1804 03/10/19 0527 03/11/19 0449 03/12/19 0246 03/13/19 0100  WBC 6.4  --   --   --  4.7 4.0 6.6 8.6  CREATININE  --  0.68  --   --  0.64 0.49 0.62 0.61  LATICACIDVEN  --   --  2.4* 1.3  --   --   --   --     Estimated Creatinine Clearance: 78.5 mL/min (by C-G formula based on SCr of 0.61 mg/dL).    No Known Allergies  Antimicrobials this admission: 11/28 Ceftriaxone x1 11/28 Vanc >> 11/30 11/28 Meropenem >> 12/2 11/29 remdesivir >>12/4 12/2 Ceftriaxone >> (11/7) 12/2 Vancomycin >> (11/7)  Dose adjustments this admission:   Microbiology results: 11/27 UCx: multiple species 11/28 COVID + 11/28 BCx: ngtd  Thank you for allowing pharmacy to be a part of this patient's care.  Gretta Arab PharmD, BCPS Clinical pharmacist phone 7am- 5pm: [phone] 03/13/2019 4:10 PM

## 2019-03-13 NOTE — Evaluation (Signed)
Physical Therapy Evaluation Patient Details Name: Rebecca Bruce MRN: [id number] DOB: 05-10-1960 Today's Date: 03/13/2019   History of Present Illness  58 y.o. female with PMHx of hypothyroidism, HTN, intellectual disability-lives in a group home for the past 20 years (normally fairly independent and talks)-brought in to [facility] on 11/28 for altered mental status, poor oral intake-further evaluation revealed COVID-19 pneumonia.   Clinical Impression  The patient is posturing with increased flexor tone of UE's and very strong extensor/adduction tone of the LE's and head is rotated and laterally tilted to the left. Patient does track inconsistently with eyes, not smoothe. Patient currently has a significant change in functional mobility as patient had been independent ambulator in recent past at her Group home.. . Patient presents with neurological signs of severe tone of all extremiities.  Pt admitted with above diagnosis. Pt currently with functional limitations due to the deficits listed below (see PT Problem List). Pt will benefit from skilled PT to increase their independence and safety with mobility to allow discharge to the venue listed below.  d    Follow Up Recommendations SNF    Equipment Recommendations  Wheelchair cushion (measurements PT);Wheelchair (measurements PT)    Recommendations for Other Services       Precautions / Restrictions Precautions Precautions: Fall;Other (comment)      Mobility  Bed Mobility Overal bed mobility: Needs Assistance Bed Mobility: Rolling Rolling: +2 for physical assistance;+2 for safety/equipment;Total assist         General bed mobility comments: use of bed pad to roll to each side, increased extensor tone in legsd and UE's are in flexion. patient did grasp rail with Left hand when it got close to the rail, had to pry the hand off for rolling back.  Transfers                    Ambulation/Gait                 Stairs            Wheelchair Mobility    Modified Rankin (Stroke Patients Only)       Balance                                             Pertinent Vitals/Pain Pain Assessment: Faces Faces Pain Scale: Hurts little more Pain Location: With PROM Pain Descriptors / Indicators: Grimacing;Guarding Pain Intervention(s): Monitored during session    Home Living Family/patient expects to be discharged to:: Skilled nursing facility                      Prior Function Level of Independence: Independent         Comments: Pt walked without an AD; able to bath and dress herself; pt loves to vacuum; sweep; clean adn dance; has a boyfriend named Psychologist, occupational        Extremity/Trunk Assessment   Upper Extremity Assessment Upper Extremity Assessment: Defer to OT evaluation Flexion tone with decreased ability to extend  Lower Extremity Assessment Lower Extremity Assessment: RLE deficits/detail;LLE deficits/detail RLE Deficits / Details: significant extensor and Abduction tone such that the knee remains extended even when foot of bed is lowered. Unable to break knee extension tone with techniques of flexing toes. Toes are upgoing, Umnable to move ankle, fixed in equino varus. LLE  Deficits / Details: initially similar to right leg withstrong extensor and .adduction  tone, Cervical / Trunk Assessment Cervical / Trunk Assessment: Other exceptions Cervical / Trunk Exceptions: head tends to rotate and lateral to the left with increased tone, no active neck ROM, With relaxation techniques, the neck was slightly more near neutral position  Communication   Communication: Other (comment)(non verbal, moaned x 1 when turned)  Cognition Arousal/Alertness: Awake/alert Behavior During Therapy: Flat affect Overall Cognitive Status: Impaired/Different from baseline                                 General Comments: Not following  commands; looks at therapist when her name is called; tracks therapist with eyes, not head turning,but difficulty sustaining visual attention; alertness apppeared to improve when music played      General Comments      Exercises     Assessment/Plan    PT Assessment Patient needs continued PT services  PT Problem List Decreased mobility;Decreased safety awareness;Impaired tone;Decreased range of motion;Decreased coordination;Decreased knowledge of precautions;Decreased activity tolerance;Decreased cognition;Decreased balance;Decreased knowledge of use of DME       PT Treatment Interventions Therapeutic activities;Cognitive remediation;Therapeutic exercise;Patient/family education;Balance training;Neuromuscular re-education;Functional mobility training    PT Goals (Current goals can be found in the Care Plan section)  Acute Rehab PT Goals PT Goal Formulation: Patient unable to participate in goal setting Time For Goal Achievement: 03/27/19 Potential to Achieve Goals: Fair    Frequency Min 2X/week   Barriers to discharge        Co-evaluation   Reason for Co-Treatment: Complexity of the patient's impairments (multi-system involvement);Necessary to address cognition/behavior during functional activity;For patient/therapist safety   OT goals addressed during session: ADL's and self-care;Strengthening/ROM       AM-PAC PT "6 Clicks" Mobility  Outcome Measure Help needed turning from your back to your side while in a flat bed without using bedrails?: Total Help needed moving from lying on your back to sitting on the side of a flat bed without using bedrails?: Total Help needed moving to and from a bed to a chair (including a wheelchair)?: Total Help needed standing up from a chair using your arms (e.g., wheelchair or bedside chair)?: Total Help needed to walk in hospital room?: Total Help needed climbing 3-5 steps with a railing? : Total 6 Click Score: 6    End of Session    Activity Tolerance: Patient tolerated treatment well Patient left: in bed;with call bell/phone within reach;with nursing/sitter in room Nurse Communication: Mobility status;Need for lift equipment PT Visit Diagnosis: Other symptoms and signs involving the nervous system [id number])    Time: [id number] PT Time Calculation (min) (ACUTE ONLY): 27 min   Charges:   PT Evaluation $PT Eval Moderate Complexity: 1 Mod PT Treatments $Therapeutic Activity: 8-22 mins        Tresa Endo PT Acute Rehabilitation Services  Office [phone]   [name] 03/13/2019, 1:41 PM

## 2019-03-13 NOTE — TOC Initial Note (Addendum)
Transition of Care [facility]) - Initial/Assessment Note    Patient Details  Name: Rebecca Bruce MRN: [id number] Date of Birth: May 05, 1960  Transition of Care [facility]) CM/SW Contact:    Ninfa Meeker, RN Phone Number: 03/13/2019, 2:31 PM  Clinical Narrative:    Patient is a 57 y.o. female with PMHx of hypothyroidism, HTN, intellectual disability-lives in a group home for the past 20 years (normally fairly independent and talks)-brought in to [facility] on 11/28 for altered mental status, poor oral intake-further evaluation revealed COVID-19 pneumonia. Patient was transferred to [facility] for further treatment. Patient is on Remdesivir until 12/4, IV steroids, RA. Case manager spoke with Gena Fray, RN for [facility] [phone], she states patient was independent and ambulatory prior to admission, she will need to be close to base line to return. Hopefully a few more days with therapy and medications will help her return to base line. Per therapist today patient is doing poorly. Case manager will continue to monitor.                Expected Discharge Plan: Group Home Barriers to Discharge: Continued Medical Work up   Patient Goals and CMS Choice        Expected Discharge Plan and Services Expected Discharge Plan: Group Home   Discharge Planning Services: CM Consult Post Acute Care Choice: [place] arrangements for the past 2 months: Group Home                                      Prior Living Arrangements/Services Living arrangements for the past 2 months: Group Home Lives with:: Facility Resident                   Activities of Daily Living Home Assistive Devices/Equipment: None ADL Screening (condition at time of admission) Patient's cognitive ability adequate to safely complete daily activities?: No Is the patient deaf or have difficulty hearing?: No Does the patient have difficulty seeing, even when wearing glasses/contacts?: No Does the  patient have difficulty concentrating, remembering, or making decisions?: Yes Patient able to express need for assistance with ADLs?: No Does the patient have difficulty dressing or bathing?: Yes Independently performs ADLs?: No Communication: Dependent Is this a change from baseline?: Change from baseline, expected to last <3 days Dressing (OT): Dependent Is this a change from baseline?: Change from baseline, expected to last <3days Grooming: Dependent Is this a change from baseline?: Change from baseline, expected to last <3 days Feeding: Dependent Is this a change from baseline?: Change from baseline, expected to last <3 days Bathing: Dependent Is this a change from baseline?: Change from baseline, expected to last <3 days Toileting: Dependent Is this a change from baseline?: Change from baseline, expected to last <3 days In/Out Bed: Dependent Is this a change from baseline?: Change from baseline, expected to last <3 days Walks in Home: Dependent Is this a change from baseline?: Change from baseline, expected to last <3 days Does the patient have difficulty walking or climbing stairs?: Yes Weakness of Legs: None Weakness of Arms/Hands: None  Permission Sought/Granted                  Emotional Assessment              Admission diagnosis:  COVID 19 Patient Active Problem List   Diagnosis Date Noted  . Acute metabolic encephalopathy 99991111  .  Acute cystitis without hematuria   . COVID-19 virus infection   . Altered mental state 03/09/2019  . Sepsis secondary to UTI ([place]) 03/09/2019  . 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 Hyperglycemia 07/13/2018  . Pre-diabetes 01/05/2017  . Other symptoms and signs involving cognitive functions and awareness 09/01/2015  . Dyslipidemia 07/02/2015  . History of hyperthyroidism 07/02/2015  . History of radioactive iodine thyroid ablation 07/02/2015  . Hypothyroidism (acquired) 07/02/2015  . Obesity 07/02/2015  .  Hypertension, benign 07/02/2015  . Anxiety 07/02/2015  . Vitamin D deficiency 07/02/2015  . Insomnia 07/02/2015  . OA (osteoarthritis) of knee 07/02/2015  . GERD (gastroesophageal reflux disease) 07/02/2015  . Hyperlipidemia, unspecified 07/02/2015   PCP:  Steele Sizer, MD Pharmacy:   [place], [place] [address] [place] [address] Phone: [phone] Fax: [phone]     Social Determinants of Health (SDOH) Interventions    Readmission Risk Interventions No flowsheet data found.

## 2019-03-13 NOTE — Consult Note (Signed)
Neurology Consultation  Reason for Consult: Unresponsiveness, altered mental status, whole body stiffness Referring Physician: Dr. Nena Alexander  CC: Altered mental status, unresponsiveness, whole body stiffness  History is obtained from: Chart, patient's sister-in-law Ms. [name] [phone]  HPI: Rebecca Bruce is a 58 y.o. female past medical history of mental retardation, resident of a group home, with normal ambulation at baseline, with normal birth history and developmental history of being normal until 40 to 58 years of age when she suddenly started to regress and "shut down" per the sister-in-law, meaning her to require more care from the family and ultimately when her mother passed away, she was put in a group home, brought in to the hospital for this admission on 03/09/2019 for change in mental status from her group home. According to the H&P, patient had been having some foul-smelling urine for 2 days prior to presentation, was less responsive, had poor p.o. intake and some left-sided weakness which prompted the hospital/ER visit.  She was found to be septic secondary to UTI and tested positive for COVID-19 infection.  Chest x-ray was concerning for bilateral patchy pulmonary infiltrates worrisome for pneumonia. Started on dexamethasone, remdesivir and anticoagulation and transferred to [facility].  Patient since admission has been nonverbal according to the H&P and rounding notes from the hospitalist over at [facility]. Neurology was consulted today by the hospitalist at [facility] for lack of verbal output, not responsiveness to questions as well as on exam patient being stiff in all her extremities.  I spoke to the patient's sister-in-law who said that at baseline the patient is able to bathe himself take care of herself, and have a conversation although her speech is not normal but she was extremely worried when she was being taken to the hospital and  at baseline has anxiety and depression. She does not recall the patient having had any episodes similar to this of behavioral arrest or catatonia in the past.  She said that in the past and the patient got anxious or concerned about things, she would start pacing and throwing things around and throwing tantrums. She required family to call the group home and speak with her to calm her down.  ROS: Unable to obtain due to altered mental status.   Past Medical History:  Diagnosis Date  . GERD (gastroesophageal reflux disease)   . Hypertension   . Thyroid disease     Family History  Problem Relation Age of Onset  . Heart disease Mother   . Breast cancer Neg Hx    Social History:   reports that she has never smoked. She has never used smokeless tobacco. She reports that she does not drink alcohol or use drugs.  Medications  Current Facility-Administered Medications:  .  0.9 %  sodium chloride infusion, , Intravenous, PRN, Ghimire, Henreitta Leber, MD, Stopped at 03/12/19 1210 .  0.9 %  sodium chloride infusion, , Intravenous, Continuous, Ghimire, Henreitta Leber, MD, Last Rate: 75 mL/hr at 03/12/19 2102 .  acetaminophen (TYLENOL) tablet 650 mg, 650 mg, Oral, Q6H PRN, Peyton Bottoms, MD .  atorvastatin (LIPITOR) tablet 20 mg, 20 mg, Oral, q1800, Peyton Bottoms, MD .  cefTRIAXone (ROCEPHIN) 2 g in sodium chloride 0.9 % 100 mL IVPB, 2 g, Intravenous, Q12H, Shade, Christine E, RPH .  chlorpheniramine-HYDROcodone (TUSSIONEX) 10-8 MG/5ML suspension 5 mL, 5 mL, Oral, Q12H PRN, Peyton Bottoms, MD .  dexamethasone (DECADRON) injection 6 mg, 6 mg, Intravenous, Q24H, Peyton Bottoms, MD, 6 mg at  03/13/19 1102 .  docusate sodium (COLACE) capsule 100 mg, 100 mg, Oral, Daily, Vera, Trinity, MD .  enoxaparin (LOVENOX) injection 40 mg, 40 mg, Subcutaneous, Q12H, Ghimire, Henreitta Leber, MD, 40 mg at 03/13/19 1103 .  guaiFENesin-dextromethorphan (ROBITUSSIN DM) 100-10 MG/5ML syrup 10 mL, 10 mL, Oral, Q4H PRN, Peyton Bottoms,  MD .  hydrALAZINE (APRESOLINE) injection 10 mg, 10 mg, Intravenous, Q6H PRN, Jonetta Osgood, MD, 10 mg at 03/13/19 1212 .  Ipratropium-Albuterol (COMBIVENT) respimat 1 puff, 1 puff, Inhalation, Q6H, Peyton Bottoms, MD, 1 puff at 03/13/19 1216 .  levothyroxine (SYNTHROID, LEVOTHROID) injection 50 mcg, 50 mcg, Intravenous, Daily, Ghimire, Henreitta Leber, MD, 50 mcg at 03/13/19 0926 .  LORazepam (ATIVAN) tablet 0.5 mg, 0.5 mg, Oral, Daily PRN, Peyton Bottoms, MD .  metoprolol tartrate (LOPRESSOR) injection 5 mg, 5 mg, Intravenous, Q6H, Ghimire, Henreitta Leber, MD, 5 mg at 03/13/19 1637 .  remdesivir 100 mg in sodium chloride 0.9 % 250 mL IVPB, 100 mg, Intravenous, Q24H, Emiliano Dyer, RPH, Last Rate: 500 mL/hr at 03/13/19 1107, 100 mg at 03/13/19 1107 .  saccharomyces boulardii (FLORASTOR) capsule 250 mg, 250 mg, Oral, Daily, Vera, Trinity, MD .  sodium chloride flush (NS) 0.9 % injection 10-40 mL, 10-40 mL, Intracatheter, Q12H, Ghimire, Henreitta Leber, MD, 10 mL at 03/12/19 2106 .  sodium chloride flush (NS) 0.9 % injection 10-40 mL, 10-40 mL, Intracatheter, PRN, Ghimire, Shanker M, MD .  vancomycin (VANCOCIN) 2,000 mg in sodium chloride 0.9 % 500 mL IVPB, 2,000 mg, Intravenous, Once, Shade, Haze Justin, RPH .  [START ON 03/14/2019] vancomycin (VANCOCIN) IVPB 1000 mg/200 mL premix, 1,000 mg, Intravenous, Q12H, Shade, Haze Justin, RPH .  vitamin C (ASCORBIC ACID) tablet 500 mg, 500 mg, Oral, Daily, Vanita Ingles, Trinity, MD .  zinc sulfate capsule 220 mg, 220 mg, Oral, Daily, Peyton Bottoms, MD  Exam: Current vital signs: BP (!) 167/74 (BP Location: Right Leg)   Pulse 98   Temp 98.8 F (37.1 C) (Axillary)   Resp (!) 34   Ht 5\' 2"  (1.575 m)   Wt 85 kg   SpO2 99%   BMI 34.27 kg/m  Vital signs in last 24 hours: Temp:  [98.3 F (36.8 C)-99.1 F (37.3 C)] 98.8 F (37.1 C) (12/02 1635) Pulse Rate:  [84-101] 98 (12/02 1635) Resp:  [18-34] 34 (12/02 1635) BP: (158-187)/(74-104) 167/74 (12/02 1635) SpO2:  [97  %-100 %] 99 % (12/02 1635) General: Patient is awake, appears alert but does not follow commands HEENT: Normocephalic atraumatic, injected left eye Lungs: Clear to auscultation Cardiovascular: Regular rhythm Abdomen: Obese nontender Extremities: Warm well perfused with increased tone all over Neurological exam Mental status: She is awake, alert.  Does not follow any commands.  Does track the examiner on both sides, and at times just keeps staring. Completely nonverbal-mute and not following commands. Cranial nerves: Blinks to threat from both sides, pupils are equal, tracks examiner on both sides, face appears symmetric. Motor exam: Sitting with both her arms flexed at the elbow and fists tightly clenched resting the arm and forearm on her belly.  There is significantly increased tone in bilateral upper extremities -appears paratonic and on repeat exams is able to let go and release the increased tone in both upper extremities.  There is mildly increased tone in the lower extremities as well. Sensory exam: Appears to have some grimace to noxious stimulation and mild withdrawal in both lower extremities but not in upper extremities. Upon repeated coaching, she was able to lift  her left hand above her belly. Cannot perform gait or coordination testing  Her neck appears stiff but she does not appear to have Kernig's or Brudzinski sign.  She attempts to raise her neck above the bed when I am trying to pull her arms but does not seem to be in any kind of pain.  There is no movement on the neck when the knee is bent or elevated.  Labs I have reviewed labs in epic and the results pertinent to this consultation are:  CBC    Component Value Date/Time   WBC 8.6 03/13/2019 0100   RBC 5.80 (H) 03/13/2019 0100   HGB 15.3 (H) 03/13/2019 0100   HGB 13.5 01/09/2015 0809   HCT 47.3 (H) 03/13/2019 0100   HCT 40.7 01/09/2015 0809   PLT 297 03/13/2019 0100   PLT 333 01/09/2015 0809   MCV 81.6 03/13/2019  0100   MCV 81 01/09/2015 0809   MCH 26.4 03/13/2019 0100   MCHC 32.3 03/13/2019 0100   RDW 14.4 03/13/2019 0100   RDW 14.2 01/09/2015 0809   LYMPHSABS 0.8 03/13/2019 0100   MONOABS 1.0 03/13/2019 0100   EOSABS 0.0 03/13/2019 0100   BASOSABS 0.0 03/13/2019 0100    CMP     Component Value Date/Time   NA 142 03/13/2019 0100   NA 144 02/03/2016   K 4.0 03/13/2019 0100   CL 109 03/13/2019 0100   CO2 22 03/13/2019 0100   GLUCOSE 94 03/13/2019 0100   BUN 16 03/13/2019 0100   BUN 11 02/03/2016   CREATININE 0.61 03/13/2019 0100   CREATININE 1.00 07/13/2018 1023   CALCIUM 8.3 (L) 03/13/2019 0100   PROT 6.8 03/13/2019 0100   PROT 7.0 01/09/2015 0809   ALBUMIN 3.1 (L) 03/13/2019 0100   ALBUMIN 4.0 01/09/2015 0809   AST 62 (H) 03/13/2019 0100   ALT 41 03/13/2019 0100   ALKPHOS 64 03/13/2019 0100   BILITOT 1.2 03/13/2019 0100   BILITOT 0.7 01/09/2015 0809   GFRNONAA >60 03/13/2019 0100   GFRNONAA 63 07/13/2018 1023   GFRAA >60 03/13/2019 0100   GFRAA 73 07/13/2018 1023     Imaging I have reviewed the images obtained: CT-scan of the brain-obtain 03/09/2019 with no acute changes.  Assessment:  58 year old past medical history of mental retardation resident of a group home, anxiety and depression, admitted for treatment of Covid pneumonia, noticed to be less responsive or unresponsive at times with eyes open, generalized stiffness and altered mental status. Upon examination, the patient tracks the examiner but did not follow any commands and remained nonverbal. She had increased tone in bilateral upper extremities with paratonia raising suspicion for an underlying psychiatric etiology -such as retarded catatonia where intubated movement/rigidity/staring/mutism are all observed. There is low suspicion for CNS infection even though she has neck stiffness, it is not typical for what is usually seen with meningitis.she has been treated with meropenem and I am not sure if there will be  much utility in doing a spinal tap at this time.  It might not be unreasonable though to cover her with empiric antibiotics for 10 days.  Recommendations: -Continue antibacterial meningitic coverage for a total of 10 days although suspicion is low but at this point doing an LP is not really going to be high yield as she has already been antibiotics for few days. -Try scheduled benzodiazepines for a day or 2-Ativan 1 mg IV 3 times daily-If this is truly catatonia, Ativan should help. -Seroquel can also be tried  but she is not taking anything p.o. at this time so that might not be possible -MRI brain-with and without contrast -If continues to be encephalopathic, will consider doing an EEG at that time although no history of seizures in the past. -If possible, obtain a psychiatry consultation in house.  Discussed with Dr. Sloan Leiter over the phone. Relayed plan to Ms. Marney Doctor over the phone as well.  -- Amie Portland, MD [facility] Pager: [phone] If 7pm to 7am, please call on call as listed on AMION.

## 2019-03-13 NOTE — Progress Notes (Signed)
OT Evaluation  Called Rebecca Bruce at [facility] for PLOF. Pt was independent with mobility and ADL, loved to vacuum and clean, dance and had a boyfriend named Nurse, adult. Pt very verbal and joked with staff. Pt nonverbal during assessment and not following commands. Visually tracking therapists and increased level of arousal with playing music and dancing in the room. Abnormal tone BUE and LE. Appears to be positioning in decerebrate posturing. Total care at this time. Will need SNF. Will follow acutely. Recommend B Prevalon boots. Will monitor BUE for need of splints.     03/13/19 1200  OT Visit Information  Last OT Received On 03/13/19  Assistance Needed +2  PT/OT/SLP Co-Evaluation/Treatment Yes  Reason for Co-Treatment Complexity of the patient's impairments (multi-system involvement);Necessary to address cognition/behavior during functional activity;For patient/therapist safety  OT goals addressed during session ADL's and self-care;Strengthening/ROM  History of Present Illness 58 y.o. female with PMHx of hypothyroidism, HTN, intellectual disability-lives in a group home for the past 20 years (normally fairly independent and talks)-brought in to [facility] on 11/28 for altered mental status, poor oral intake-further evaluation revealed COVID-19 pneumonia.   Precautions  Precautions Fall;Other (comment) (risk for contractures/skin breakdown)  Home Living  Family/patient expects to be discharged to: Skilled nursing facility  Prior Function  Level of Independence Independent  Comments Pt walked without an AD; able to bath and dress herself; pt loves to vacuum; sweep; clean adn dance; has a boyfriend named Dealer Other (comment) (nonverbal)  Pain Assessment  Pain Assessment Faces  Faces Pain Scale 4  Pain Location With PROM  Pain Descriptors / Indicators Grimacing;Guarding  Pain Intervention(s) Limited activity within patient's tolerance  Cognition  Arousal/Alertness  Awake/alert  Behavior During Therapy Flat affect  Overall Cognitive Status Impaired/Different from baseline  General Comments Not following commands; looks at therapist when her name is called; tracks therapist but difficulty sustaining visual attention; alertness apppeared to improve when music played  Upper Extremity Assessment  Upper Extremity Assessment RUE deficits/detail;LUE deficits/detail  RUE Deficits / Details holding RUE in flexed position; Ableto range hand and wrist into full extension after ? tone inhibited/pt relaxed; unable to fully passively extend elbow - lacking 45 degrees; unablet o range shoulder duet o tihgness/tone  RUE Coordination decreased fine motor;decreased gross motor  LUE Deficits / Details simlar to RUE, hoever, observed spontaneous movemetn; Pt shaking LUE - unsure if moving arm to beat of music; moved toward face but not using functionally  LUE Coordination decreased fine motor;decreased gross motor  Lower Extremity Assessment  Lower Extremity Assessment Defer to PT evaluation (RLE held in extension)  Cervical / Trunk Assessment  Cervical / Trunk Assessment  (appeasrs to be in decerebrate posturing)  ADL  Overall ADL's  Needs assistance/impaired  General ADL Comments total A with all ADL  Vision- History  Baseline Vision/History No visual deficits  Vision- Assessment  Vision Assessment? Vision impaired- to be further tested in functional context  Additional Comments Pt tracking therapist but difficulty with sustaining visual attention; conjugate gaze; L eye with rapid blinking and red - appears to have discomfort  Perception  Comments ? R inattention; will further assess  Praxis  Praxis-Other Comments no initiation  Bed Mobility  General bed mobility comments total A +2 with any mobility  Transfers  General transfer comment NA, Will need Maximove  Balance  Overall balance assessment Needs assistance  Sitting balance-Leahy Scale Zero  OT - End of  Session  Activity Tolerance Patient tolerated treatment well  Patient left in bed;with call bell/phone within reach;with bed alarm set (relined chair position)  Nurse Communication Mobility status;Other (comment) (need for suction set up)  OT Assessment  OT Recommendation/Assessment Patient needs continued OT Services  OT Visit Diagnosis Other abnormalities of gait and mobility (R26.89);Muscle weakness (generalized) (M62.81);Feeding difficulties (R63.3);Other symptoms and signs involving cognitive function;Pain;Hemiplegia and hemiparesis  Hemiplegia - Right/Left  (B)  Hemiplegia - caused by Unspecified  Pain - Right/Left  (All extremities with ROM)  OT Problem List Decreased strength;Decreased range of motion;Decreased activity tolerance;Impaired balance (sitting and/or standing);Impaired vision/perception;Decreased coordination;Decreased cognition;Decreased safety awareness;Decreased knowledge of use of DME or AE;Cardiopulmonary status limiting activity;Decreased knowledge of precautions;Impaired sensation;Impaired tone;Obesity;Impaired UE functional use;Pain  OT Plan  OT Frequency (ACUTE ONLY) Min 2X/week  OT Treatment/Interventions (ACUTE ONLY) Self-care/ADL training;Therapeutic exercise;Neuromuscular education;Energy conservation;DME and/or AE instruction;Splinting;Therapeutic activities;Cognitive remediation/compensation;Visual/perceptual remediation/compensation;Patient/family education;Balance training  AM-PAC OT "6 Clicks" Daily Activity Outcome Measure (Version 2)  Help from another person eating meals? 1  Help from another person taking care of personal grooming? 1  Help from another person toileting, which includes using toliet, bedpan, or urinal? 1  Help from another person bathing (including washing, rinsing, drying)? 1  Help from another person to put on and taking off regular upper body clothing? 1  Help from another person to put on and taking off regular lower body clothing?  1  6 Click Score 6  OT Recommendation  Follow Up Recommendations SNF;Supervision/Assistance - 24 hour  OT Equipment None recommended by OT  Individuals Consulted  Consulted and Agree with Results and Recommendations Patient unable/family or caregiver not available  Acute Rehab OT Goals  Patient Stated Goal pt unable  OT Goal Formulation Patient unable to participate in goal setting  Time For Goal Achievement 03/27/19  Potential to Achieve Goals Fair  OT Time Calculation  OT Start Time (ACUTE ONLY) 0950  OT Stop Time (ACUTE ONLY) 1031  OT Time Calculation (min) 41 min  OT General Charges  $OT Visit 1 Visit  OT Evaluation  $OT Eval Moderate Complexity 1 Mod  OT Treatments  $Self Care/Home Management  8-22 mins  Maurie Boettcher, OT/L   Acute OT Clinical Specialist Acute Rehabilitation Services Pager [phone] Office [phone]

## 2019-03-14 ENCOUNTER — Inpatient Hospital Stay (HOSPITAL_COMMUNITY): Payer: Medicare Other

## 2019-03-14 ENCOUNTER — Inpatient Hospital Stay (HOSPITAL_COMMUNITY)
Admission: AD | Admit: 2019-03-14 | Discharge: 2019-03-14 | Disposition: A | Discharge status: Home / Self Care | Payer: Medicare Other | Source: Other Acute Inpatient Hospital | Attending: Internal Medicine | Admitting: Internal Medicine

## 2019-03-14 DIAGNOSIS — R4182 Altered mental status, unspecified: Secondary | ICD-10-CM

## 2019-03-14 LAB — COMPREHENSIVE METABOLIC PANEL
ALT: 44 U/L (ref 0–44)
AST: 56 U/L — ABNORMAL HIGH (ref 15–41)
Albumin: 3 g/dL — ABNORMAL LOW (ref 3.5–5.0)
Alkaline Phosphatase: 60 U/L (ref 38–126)
Anion gap: 12 (ref 5–15)
BUN: 20 mg/dL (ref 6–20)
CO2: 22 mmol/L (ref 22–32)
Calcium: 8.1 mg/dL — ABNORMAL LOW (ref 8.9–10.3)
Chloride: 109 mmol/L (ref 98–111)
Creatinine, Ser: 0.55 mg/dL (ref 0.44–1.00)
GFR calc Af Amer: >60 mL/min (ref >60)
GFR calc non Af Amer: >60 mL/min (ref >60)
Glucose, Bld: 102 mg/dL — ABNORMAL HIGH (ref 70–99)
Potassium: 3.3 mmol/L — ABNORMAL LOW (ref 3.5–5.1)
Sodium: 143 mmol/L (ref 135–145)
Total Bilirubin: 1.1 mg/dL (ref 0.3–1.2)
Total Protein: 6.5 g/dL (ref 6.5–8.1)

## 2019-03-14 LAB — CBC WITH DIFFERENTIAL/PLATELET
Abs Immature Granulocytes: 0.11 10*3/uL — ABNORMAL HIGH (ref 0.00–0.07)
Basophils Absolute: 0.1 10*3/uL (ref 0.0–0.1)
Basophils Relative: 1 %
Eosinophils Absolute: 0 10*3/uL (ref 0.0–0.5)
Eosinophils Relative: 0 %
HCT: 47 % — ABNORMAL HIGH (ref 36.0–46.0)
Hemoglobin: 14.9 g/dL (ref 12.0–15.0)
Immature Granulocytes: 1 %
Lymphocytes Relative: 11 %
Lymphs Abs: 0.9 10*3/uL (ref 0.7–4.0)
MCH: 26.3 pg (ref 26.0–34.0)
MCHC: 31.7 g/dL (ref 30.0–36.0)
MCV: 82.9 fL (ref 80.0–100.0)
Monocytes Absolute: 0.8 10*3/uL (ref 0.1–1.0)
Monocytes Relative: 10 %
Neutro Abs: 6.3 10*3/uL (ref 1.7–7.7)
Neutrophils Relative %: 77 %
Platelets: 300 10*3/uL (ref 150–400)
RBC: 5.67 MIL/uL — ABNORMAL HIGH (ref 3.87–5.11)
RDW: 14.6 % (ref 11.5–15.5)
WBC: 8.2 10*3/uL (ref 4.0–10.5)
nRBC: 0 % (ref 0.0–0.2)

## 2019-03-14 LAB — CULTURE, BLOOD (ROUTINE X 2)
Culture: NO GROWTH
Special Requests: ADEQUATE

## 2019-03-14 LAB — GLUCOSE, CAPILLARY
Glucose-Capillary: 136 mg/dL — ABNORMAL HIGH (ref 70–99)
Glucose-Capillary: 110 mg/dL — ABNORMAL HIGH (ref 70–99)

## 2019-03-14 LAB — C-REACTIVE PROTEIN: CRP: 2.5 mg/dL — ABNORMAL HIGH (ref <1.0)

## 2019-03-14 LAB — D-DIMER, QUANTITATIVE: D-Dimer, Quant: 3.81 ug/mL-FEU — ABNORMAL HIGH (ref 0.00–0.50)

## 2019-03-14 LAB — FERRITIN: Ferritin: 1216 ng/mL — ABNORMAL HIGH (ref 11–307)

## 2019-03-14 IMAGING — MR MR HEAD WO/W CM
9 of 13 series · 34 of 48 positions shown · IV contrast (gadavist)
Comparison: Head CTs [DATE] and earlier.

CLINICAL DATA: 58-year-old female with encephalopathy. Altered
mental status since [DATE], diagnosed with [5F] pneumonia.

EXAM:
MRI HEAD WITHOUT AND WITH CONTRAST
TECHNIQUE: Multiplanar, multiecho pulse sequences of the brain and surrounding
structures were obtained without and with intravenous contrast.
CONTRAST:  8mL GADAVIST GADOBUTROL 1 MMOL/ML IV SOLN

[Series 3: DWI · axial · 3.0mm · 1.09mm/px · z∈[-48,+83]mm · 9 of 90 slices shown (1 of 4)]
[im 1/90]
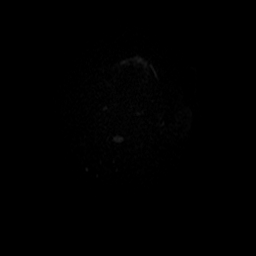
[im 12/90]
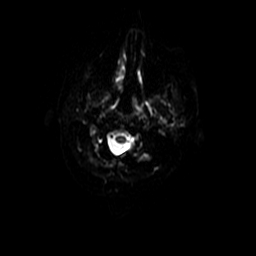
[im 23/90]
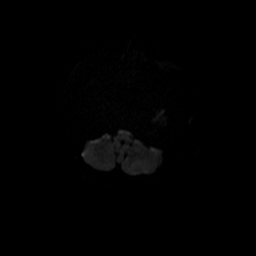
[im 34/90]
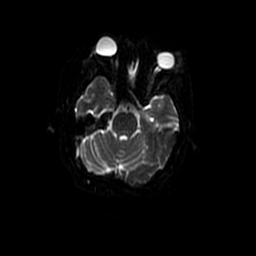
[im 45/90]
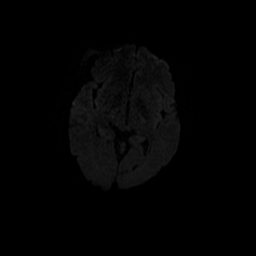
[im 56/90]
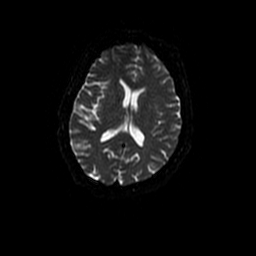
[im 67/90]
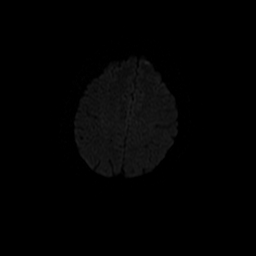
[im 78/90]
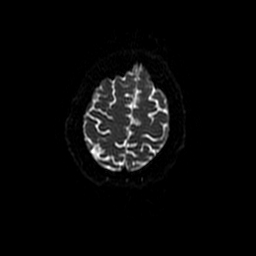
[im 90/90]
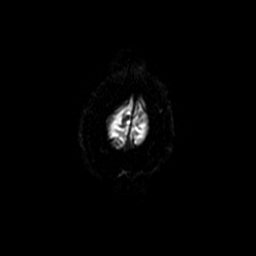

[Series 4: DWI · coronal · 5.0mm · 1.09mm/px · 6 of 64 slices shown (2 of 4)]
[im 1/64]
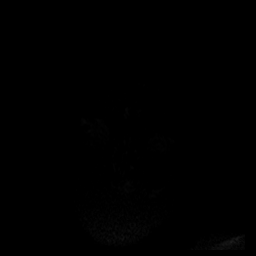
[im 13/64]
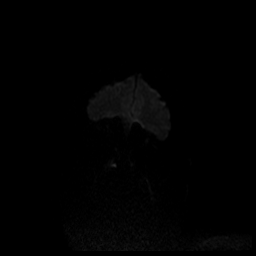
[im 26/64]
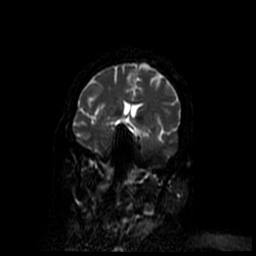
[im 38/64]
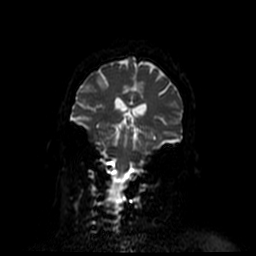
[im 51/64]
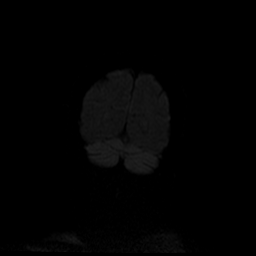
[im 64/64]
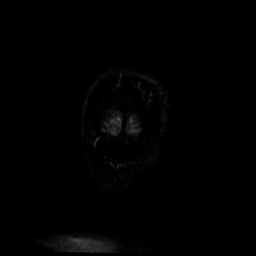

[Series 6: T2 · axial · 5.0mm · 0.43mm/px · z∈[-61,+74]mm · 2 of 24 slices shown]
[im 1/24]
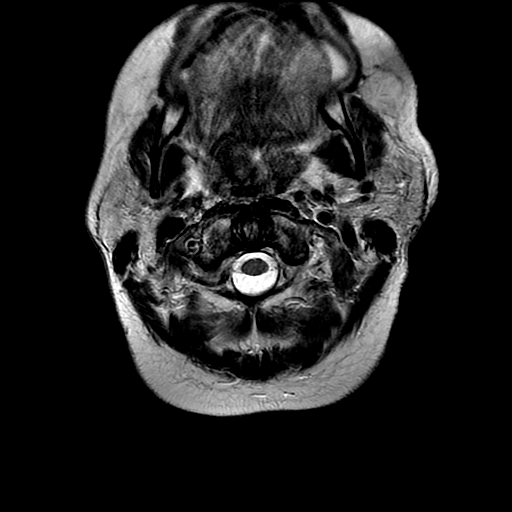
[im 24/24]
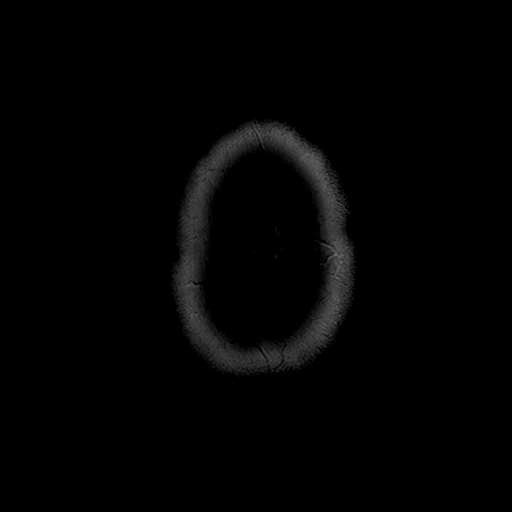

[Series 7: FLAIR · axial · 5.0mm · 0.43mm/px · z∈[-61,+74]mm · 2 of 24 slices shown]
[im 1/24]
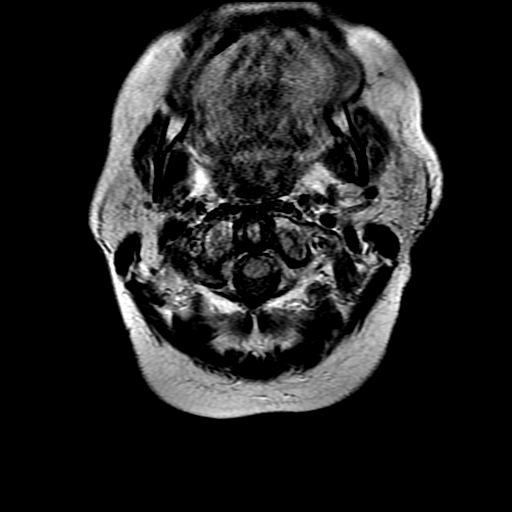
[im 24/24]
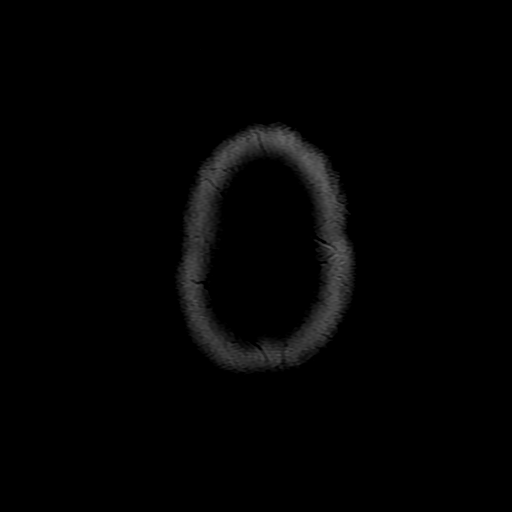

[Series 11: T1 post-contrast · axial · 3.0mm · 0.47mm/px · z∈[-63,+75]mm · 4 of 48 slices shown (1 of 3)]
[im 1/48]
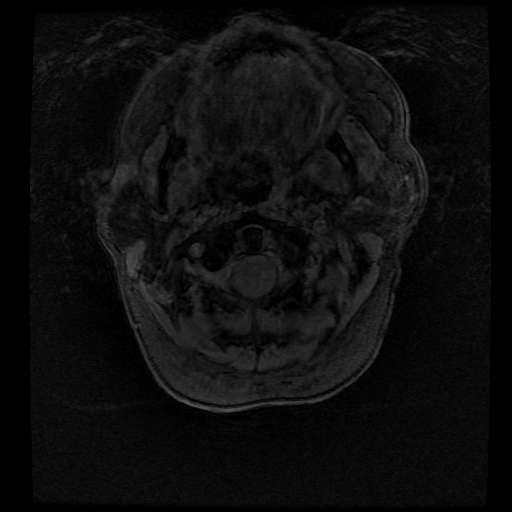
[im 16/48]
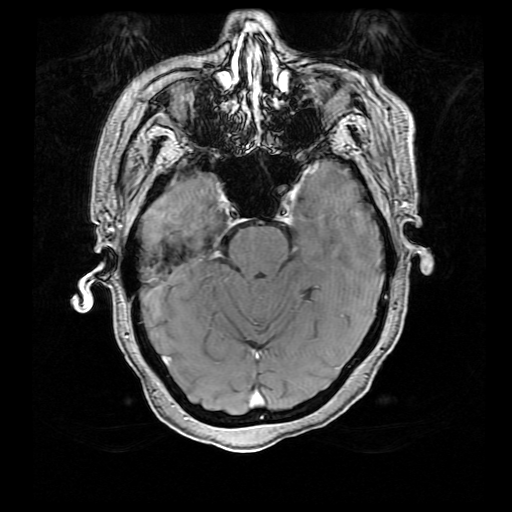
[im 32/48]
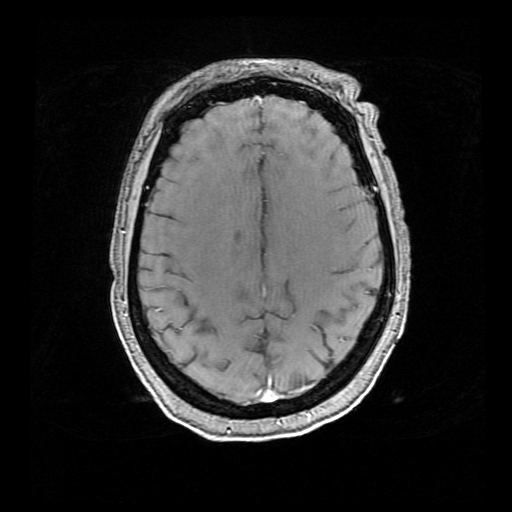
[im 48/48]
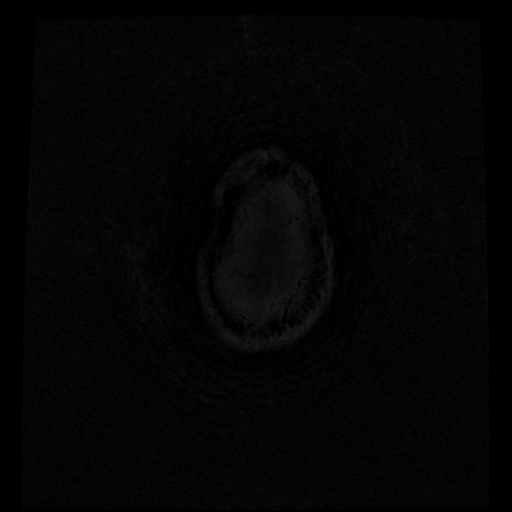

[Series 12: T1 post-contrast · coronal · 5.0mm · 0.39mm/px · 2 of 25 slices shown (2 of 3)]
[im 1/25]
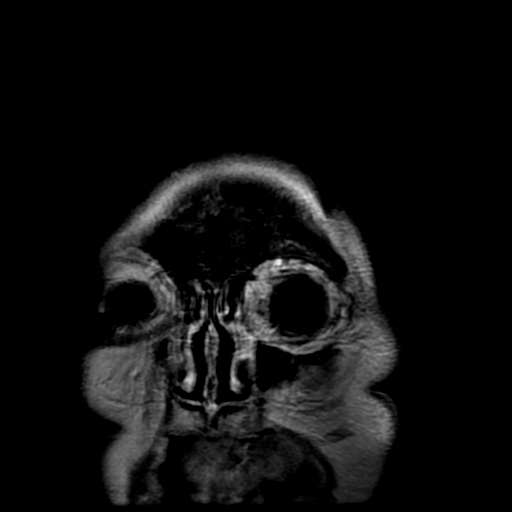
[im 25/25]
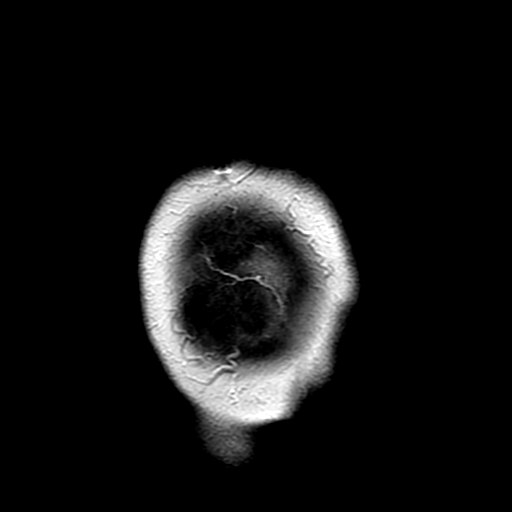

[Series 13: T1 post-contrast · sagittal · 5.0mm · 0.47mm/px · 2 of 23 slices shown (3 of 3)]
[im 1/23]
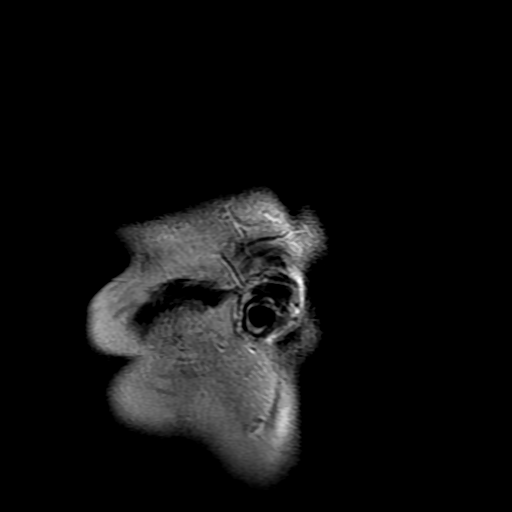
[im 23/23]
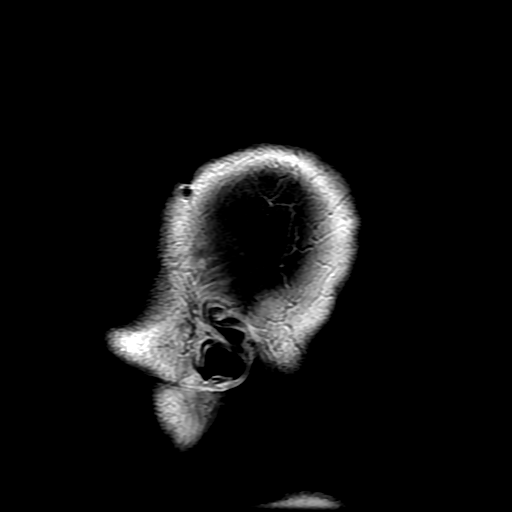

[Series 300: DWI · axial · 3.0mm · 1.09mm/px · z∈[-48,+83]mm · 4 of 45 slices shown (3 of 4)]
[im 1/45]
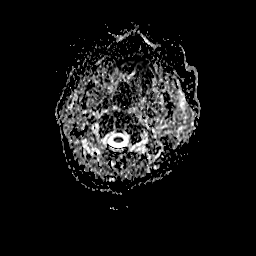
[im 15/45]
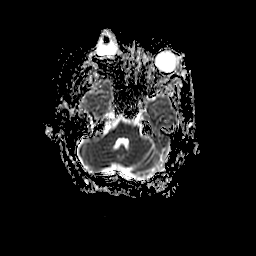
[im 30/45]
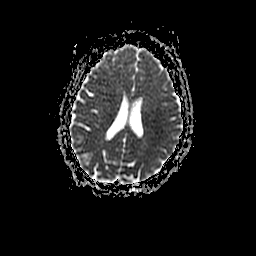
[im 45/45]
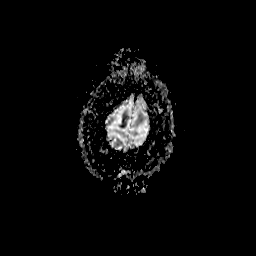

[Series 400: DWI · coronal · 5.0mm · 1.09mm/px · 3 of 32 slices shown (4 of 4)]
[im 1/32]
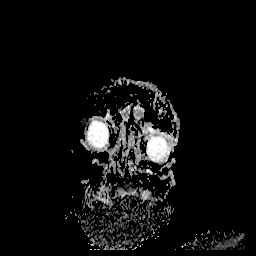
[im 16/32]
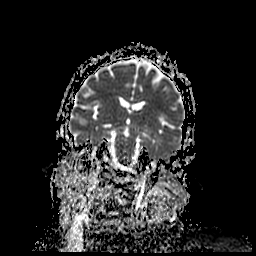
[im 32/32]
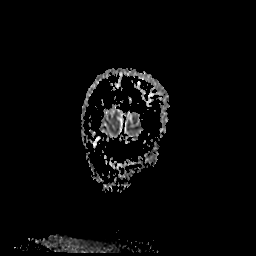

[34 of 48 positions shown; findings below may reference images not displayed]

FINDINGS: Brain: Cerebral volume is within normal limits for age. No
restricted diffusion to suggest acute infarction. No midline shift,
mass effect, evidence of mass lesion, ventriculomegaly, extra-axial
collection or acute intracranial hemorrhage. Cervicomedullary
junction and pituitary are within normal limits.

Gray and white matter signal is within normal limits for age
throughout the brain. No convincing encephalomalacia. No chronic
cerebral blood products identified. Deep gray nuclei, brainstem and
cerebellum appear normal.

No abnormal enhancement identified. No dural thickening.

Vascular: Major intracranial vascular flow voids are preserved.
Major dural venous sinuses are enhancing and appear to be patent.

Skull and upper cervical spine: Negative visible cervical spine and
spinal cord. Visualized bone marrow signal is within normal limits.

Sinuses/Orbits: Negative orbits. Paranasal sinuses are clear.

Other: Mastoids are clear. Visible internal auditory structures
appear normal. Scalp and face soft tissues appear negative.
IMPRESSION: Normal for age MRI appearance of the brain.

## 2019-03-14 MED ORDER — LORAZEPAM 2 MG/ML IJ SOLN
1.0000 mg | Freq: Four times a day (QID) | INTRAMUSCULAR | Status: DC
  Administered 2019-03-14 – 2019-03-15 (×2): 1 mg via INTRAVENOUS
  Filled 2019-03-14 (×4): qty 1

## 2019-03-14 MED ORDER — POTASSIUM CHLORIDE 10 MEQ/100ML IV SOLN
10.0000 meq | INTRAVENOUS | Status: AC
  Administered 2019-03-14 (×2): 10 meq via INTRAVENOUS
  Filled 2019-03-14 (×2): qty 100

## 2019-03-14 MED ORDER — LORAZEPAM 2 MG/ML IJ SOLN
1.0000 mg | Freq: Once | INTRAMUSCULAR | Status: AC | PRN
  Administered 2019-03-14: 1 mg via INTRAVENOUS

## 2019-03-14 MED ORDER — DEXAMETHASONE SODIUM PHOSPHATE 10 MG/ML IJ SOLN
4.0000 mg | INTRAMUSCULAR | Status: AC
  Administered 2019-03-15 – 2019-03-21 (×7): 4 mg via INTRAVENOUS
  Filled 2019-03-14: qty 1
  Filled 2019-03-14 (×5): qty 0.4
  Filled 2019-03-14: qty 1

## 2019-03-14 MED ORDER — GADOBUTROL 1 MMOL/ML IV SOLN
8.0000 mL | Freq: Once | INTRAVENOUS | Status: AC | PRN
  Administered 2019-03-14: 8 mL via INTRAVENOUS

## 2019-03-14 MED ORDER — INSULIN ASPART 100 UNIT/ML ~~LOC~~ SOLN
0.0000 [IU] | Freq: Three times a day (TID) | SUBCUTANEOUS | Status: DC
  Administered 2019-03-15: 1 [IU] via SUBCUTANEOUS

## 2019-03-14 NOTE — Progress Notes (Signed)
PROGRESS NOTE                                                                                                                                                                                                             Patient Demographics:    Rebecca Bruce, is a 58 y.o. female, DOB - 1960-05-13, ZES:[id number]  Outpatient Primary MD for the patient is Steele Sizer, MD   Admit date - 03/11/2019   LOS - 3  No chief complaint on file.      Brief Narrative: Patient is a 58 y.o. female with PMHx of hypothyroidism, HTN, intellectual disability-lives in a group home for the past 20 years (normally fairly independent and talks)-brought in to [facility] on 11/28 for altered mental status, poor oral intake-further evaluation revealed COVID-19 pneumonia.  Patient was subsequently admitted to the hospitalist service at [facility], and transferred to my service on 12/1 at [facility].   Subjective:    Rebecca Bruce today appears essentially unchanged-she is awake at times-we will tracking movements.  But will really not verbalize.  She does not follow commands.  Rigidity remains unchanged.   Assessment  & Plan :   COVID-19 viral pneumonia: Improving from the standpoint-she is afebrile-on room air.  CRP continues to downtrend.  Will finish remdesivir today-begin tapering steroids.    Fever: afebrile  O2 requirements:  SpO2: 100 % on room air  COVID-19 Labs: Recent Labs    03/12/19 0246 03/13/19 0100 03/14/19 0100  DDIMER 8.22* 7.28* 3.81*  FERRITIN 1,259* 1,114* 1,216*  CRP 7.0* 4.1* 2.5*    No results found for: BNP  Recent Labs  Lab 03/10/19 0527 03/11/19 0449  PROCALCITON <0.10 <0.10    Lab Results  Component Value Date   SARSCOV2NAA POSITIVE (A) 03/09/2019     COVID-19 Medications: Steroids: 11/29>> Remdesivir: 11/29>> 12/3 Actemra: Not given due to lack of hypoxemia Convalescent Plasma: Not given  Other medications:  Diuretics:Euvolemic-no need for lasix Antibiotics: Vancomycin: 11/28>> 12/1 Meropenem: 11/28>> 12/2 Vancomycin: 12/2>> Rocephin: 12/2  CBG's stable on SSI while on steroid (A1c 6.1 on 11/28)  Prone/Incentive Spirometry: Doubt will be cooperative.  DVT Prophylaxis  :  Lovenox   Acute metabolic encephalopathy superimposed intellectual disability: She is not agitated-resting comfortably-still with a lot of rigidity.  Appreciate neurology input-looks like a behavioral issue-but being empirically covered with Rocephin/vancomycin  for meningitis (felt to be low probability).  Awaiting EEG/MRI brain.  Remains on scheduled Ativan that was started yesterday-with not much effect.  She remains essentially the same as yesterday-Dr. Rory Percy continues to follow-have spoken to him this morning-we will change Ativan to every 6 hours dosing-he will follow tomorrow.  Sepsis: Probably secondary to COVID-19-urine cultures are inconclusive-blood cultures negative.  Empiric vancomycin/Rocephin-for presumed meningitis-although considered low probability given overall clinical situation.  Hypothyroidism: No significant oral intake-continue IV levothyroxine-switch back to oral when oral intake resumes.  HTN: Better controlled-continue scheduled IV Lopressor-and also on as needed hydralazine.  Dyslipidemia: Resume statins when able-when oral intake returns  Obesity: Estimated body mass index is 34.27 kg/m as calculated from the following:   Height as of this encounter: _0  (1.575 m).   Weight as of this encounter: 85 kg.   Consults  :  None  Procedures  :  None  ABG: No results found for: PHART, PCO2ART, PO2ART, HCO3, TCO2, ACIDBASEDEF, O2SAT  Vent Settings:    Condition - Stable  Family Communication  : Sister-in-law updated over the phone on 12/3  Code Status :  Full Code/  Diet :  Diet Order            Diet full liquid Room service appropriate? Yes; Fluid consistency: Thin  Diet effective now                Disposition Plan  :  Remain hospitalized  Barriers to discharge: complete 5 days of IV Remdesivir/needs improvement in mental status  Antimicorbials  :    Anti-infectives (From admission, onward)   Start     Dose/Rate Route Frequency Ordered Stop   03/14/19 0600  vancomycin (VANCOCIN) IVPB 1000 mg/200 mL premix     1,000 mg 200 mL/hr over 60 Minutes Intravenous Every 12 hours 03/13/19 1618 03/19/19 0559   03/13/19 2200  cefTRIAXone (ROCEPHIN) 2 g in sodium chloride 0.9 % 100 mL IVPB     2 g 200 mL/hr over 30 Minutes Intravenous Every 12 hours 03/13/19 1618 03/19/19 0959   03/13/19 1630  vancomycin (VANCOCIN) 2,000 mg in sodium chloride 0.9 % 500 mL IVPB     2,000 mg 250 mL/hr over 120 Minutes Intravenous  Once 03/13/19 1618 03/14/19 0700   03/12/19 1800  vancomycin (VANCOCIN) 1,750 mg in sodium chloride 0.9 % 500 mL IVPB  Status:  Discontinued     1,750 mg 250 mL/hr over 120 Minutes Intravenous Every 24 hours 03/11/19 1938 03/12/19 0903   03/12/19 1000  remdesivir 100 mg in sodium chloride 0.9 % 250 mL IVPB     100 mg 500 mL/hr over 30 Minutes Intravenous Every 24 hours 03/11/19 1943 03/14/19 1055   03/11/19 2200  meropenem (MERREM) 1 g in sodium chloride 0.9 % 100 mL IVPB  Status:  Discontinued     1 g 200 mL/hr over 30 Minutes Intravenous Every 8 hours 03/11/19 1938 03/13/19 1604      Inpatient Medications  Scheduled Meds: . atorvastatin  20 mg Oral q1800  . dexamethasone  6 mg Intravenous Q24H  . docusate sodium  100 mg Oral Daily  . enoxaparin (LOVENOX) injection  40 mg Subcutaneous Q12H  . Ipratropium-Albuterol  1 puff Inhalation Q6H  . levothyroxine  50 mcg Intravenous Daily  . LORazepam  1 mg Intravenous TID  . metoprolol tartrate  5 mg Intravenous Q6H  . saccharomyces boulardii  250 mg Oral Daily  . sodium chloride flush  10-40 mL Intracatheter  Q12H  . vitamin C  500 mg Oral Daily  . zinc sulfate  220 mg Oral Daily   Continuous Infusions: .  sodium chloride Stopped (03/12/19 1210)  . sodium chloride 75 mL/hr at 03/14/19 1125  . cefTRIAXone (ROCEPHIN)  IV Stopped (03/14/19 0944)  . vancomycin Stopped (03/14/19 2992)   PRN Meds:.sodium chloride, acetaminophen, chlorpheniramine-HYDROcodone, guaiFENesin-dextromethorphan, hydrALAZINE, sodium chloride flush   Time Spent in minutes  25  See all Orders from today for further details   Oren Binet M.D on 03/14/2019 at 1:49 PM  To page go to [email or web address] - use universal password  [facility] -  Office  [phone]    Objective:   Vitals:   03/14/19 0500 03/14/19 0719 03/14/19 0830 03/14/19 1203  BP: (!) 162/80 (!) 171/95 (!) 180/90 (!) 162/89  Pulse: 66 65  81  Resp: (!) 24 (!) 22  (!) 26  Temp: 98.4 F (36.9 C) 98.7 F (37.1 C)  97.9 F (36.6 C)  TempSrc: Axillary Axillary  Axillary  SpO2: 97% 99%  100%  Weight:      Height:        Wt Readings from Last 3 Encounters:  03/12/19 85 kg  03/11/19 84.6 kg  03/08/19 90.7 kg     Intake/Output Summary (Last 24 hours) at 03/14/2019 1349 Last data filed at 03/14/2019 1125 Gross per 24 hour  Intake 3648.97 ml  Output 800 ml  Net 2848.97 ml     Physical Exam Gen Exam:Alert-tracks movement.  But not verbal. HEENT:atraumatic, normocephalic Chest: B/L clear to auscultation anteriorly CVS:S1S2 regular Abdomen:soft non tender, non distended Extremities:no edema Neurology: Difficult exam-appears very rigid still-but appears to be fighting you.  She seems to move all 4 extremities in response to pain today.  Skin: no rash   Data Review:    CBC Recent Labs  Lab 03/08/19 1252 03/09/19 1215 03/10/19 0527 03/11/19 0449 03/12/19 0246 03/13/19 0100 03/14/19 0100  WBC 6.8 6.4 4.7 4.0 6.6 8.6 8.2  HGB 14.6 14.9 13.9 14.2 15.2* 15.3* 14.9  HCT 44.6 44.3 42.7 42.8 46.3* 47.3* 47.0*  PLT 217 223 206 198 255 297 300  MCV 80.5 78.0* 81.0 79.3* 80.2 81.6 82.9  MCH 26.4 26.2 26.4 26.3 26.3 26.4 26.3  MCHC  32.7 33.6 32.6 33.2 32.8 32.3 31.7  RDW 13.8 13.9 14.0 13.8 14.1 14.4 14.6  LYMPHSABS 0.7 0.8  --   --  0.6* 0.8 0.9  MONOABS 0.9 0.7  --   --  0.6 1.0 0.8  EOSABS 0.0 0.0  --   --  0.0 0.0 0.0  BASOSABS 0.0 0.0  --   --  0.0 0.0 0.1    Chemistries  Recent Labs  Lab 03/10/19 0527 03/11/19 0449 03/12/19 0246 03/13/19 0100 03/14/19 0100  NA 136 137 139 142 143  K 3.6 3.2* 4.0 4.0 3.3*  CL 100 103 104 109 109  CO2 _0 GLUCOSE 133* 138* 119* 94 102*  BUN _1 CREATININE 0.64 0.49 0.62 0.61 0.55  CALCIUM 8.2* 8.0* 8.4* 8.3* 8.1*  AST 53* 56* 63* 62* 56*  ALT 24 29 37 41 44  ALKPHOS 62 61 64 64 60  BILITOT 1.0 1.1 1.0 1.2 1.1   ------------------------------------------------------------------------------------------------------------------ No results for input(s): CHOL, HDL, LDLCALC, TRIG, CHOLHDL, LDLDIRECT in the last 72 hours.  Lab Results  Component Value Date   HGBA1C 6.1 (H) 03/09/2019   ------------------------------------------------------------------------------------------------------------------ No results for input(s): TSH, T4TOTAL, T3FREE,  THYROIDAB in the last 72 hours.  Invalid input(s): FREET3 ------------------------------------------------------------------------------------------------------------------ Recent Labs    03/13/19 0100 03/14/19 0100  FERRITIN 1,114* 1,216*    Coagulation profile No results for input(s): INR, PROTIME in the last 168 hours.  Recent Labs    03/13/19 0100 03/14/19 0100  DDIMER 7.28* 3.81*    Cardiac Enzymes No results for input(s): CKMB, TROPONINI, MYOGLOBIN in the last 168 hours.  Invalid input(s): CK ------------------------------------------------------------------------------------------------------------------ No results found for: BNP  Micro Results Recent Results (from the past 240 hour(s))  Urine culture     Status: Abnormal   Collection Time: 03/08/19  2:33 PM   Specimen: Urine,  Clean Catch  Result Value Ref Range Status   Specimen Description   Final    URINE, CLEAN CATCH Performed at [facility], [address], [place], [place] [address]    Special Requests   Final    NONE Performed at [facility], [address] [place], [place] [address]    Culture MULTIPLE SPECIES PRESENT, SUGGEST RECOLLECTION (A)  Final   Report Status 03/11/2019 FINAL  Final  SARS CORONAVIRUS 2 (TAT 6-24 HRS) Nasopharyngeal Nasopharyngeal Swab     Status: Abnormal   Collection Time: 03/09/19  1:24 PM   Specimen: Nasopharyngeal Swab  Result Value Ref Range Status   SARS Coronavirus 2 POSITIVE (A) NEGATIVE Final    Comment: RESULT CALLED TO, READ BACK BY AND VERIFIED WITH: RN MAC BURNS AT 0059 ON 03/10/2019 BY MESSAN HOUEGNIFIO (NOTE) SARS-CoV-2 target nucleic acids are DETECTED. The SARS-CoV-2 RNA is generally detectable in upper and lower respiratory specimens during the acute phase of infection. Positive results are indicative of the presence of SARS-CoV-2 RNA. Clinical correlation with patient history and other diagnostic information is  necessary to determine patient infection status. Positive results do not rule out bacterial infection or co-infection with other viruses.  The expected result is Negative. Fact Sheet for Patients: SugarRoll.be Fact Sheet for Healthcare Providers: https://www.woods-mathews.com/ This test is not yet approved or cleared by the [place] FDA and  has been authorized for detection and/or diagnosis of SARS-CoV-2 by FDA under an Emergency Use Authorization (EUA). This EUA will remain  in effect (meaning this test  can be used) for the duration of the COVID-19 declaration under Section 564(b)(1) of the Act, 21 U.S.C. section 360bbb-3(b)(1), unless the authorization is terminated or revoked sooner. Performed at [facility], [place] [address], [place], [place] [address]    Culture, blood (x 2)     Status: None   Collection Time: 03/09/19  3:17 PM   Specimen: BLOOD  Result Value Ref Range Status   Specimen Description BLOOD LEFT ANTECUBITAL  Final   Special Requests   Final    BOTTLES DRAWN AEROBIC AND ANAEROBIC Blood Culture adequate volume   Culture   Final    NO GROWTH 5 DAYS Performed at [facility], [address] [place], [place] [address]    Report Status 03/14/2019 FINAL  Final    Radiology Reports Ct Head Wo Contrast  Result Date: 03/09/2019 CLINICAL DATA:  Altered mental status EXAM: CT HEAD WITHOUT CONTRAST TECHNIQUE: Contiguous axial images were obtained from the base of the skull through the vertex without intravenous contrast. COMPARISON:  None. FINDINGS: Brain: The ventricles are normal in size and configuration. There is no intracranial mass, hemorrhage, extra-axial fluid collection, or midline shift. The brain parenchyma appears unremarkable. No acute infarct evident. Vascular: No hyperdense vessel. There is no appreciable  vascular calcification. Skull: The bony calvarium appears intact. Sinuses/Orbits: There is mucosal thickening in several ethmoid air cells. Other visualized paranasal sinuses are clear. Visualized orbits appear symmetric bilaterally. Other: Mastoid air cells are clear. IMPRESSION: Mucosal thickening in several ethmoid air cells. Study otherwise unremarkable. Electronically Signed   By: Lowella Grip III M.D.   On: 03/09/2019 13:20   Ct Head Wo Contrast  Result Date: 03/08/2019 CLINICAL DATA:  Altered level of consciousness.  Hypertension. EXAM: CT HEAD WITHOUT CONTRAST TECHNIQUE: Contiguous axial images were obtained from the base of the skull through the vertex without intravenous contrast. COMPARISON:  None. FINDINGS: Brain: The ventricles are normal in size and configuration. There is no intracranial mass, hemorrhage, extra-axial fluid collection, or midline shift. The brain parenchyma appears unremarkable.  There is no evident acute infarct. Vascular: There is no hyperdense vessel. There is no appreciable vascular calcification. Skull: The bony calvarium appears intact. Sinuses/Orbits: There is mild mucosal thickening in several ethmoid air cells. Other visualized paranasal sinuses are clear. Visualized orbits appear symmetric bilaterally. Other: Visualized mastoid air cells are clear. IMPRESSION: Mild mucosal thickening in several ethmoid air cells. Study otherwise unremarkable. Electronically Signed   By: Lowella Grip III M.D.   On: 03/08/2019 14:07   Dg Chest Port 1 View  Result Date: 03/10/2019 CLINICAL DATA:  Acute mental status change. EXAM: PORTABLE CHEST 1 VIEW COMPARISON:  June 10, 2005 FINDINGS: No pneumothorax. The heart size is borderline to mildly enlarged but poorly assessed on a portable film. Hila and mediastinum are unremarkable. Bilateral patchy pulmonary opacities are identified. No other acute abnormalities. IMPRESSION: 1. Bilateral patchy pulmonary infiltrates worrisome for pneumonia. Atypical infections should be considered. 2. No other acute abnormalities are identified. Electronically Signed   By: Dorise Bullion III M.D   On: 03/10/2019 14:56   [place] Ekg Site Rite  Result Date: 03/12/2019 If Site Rite image not attached, placement could not be confirmed due to current cardiac rhythm.

## 2019-03-14 NOTE — Progress Notes (Signed)
Called to see if patient is available. RN is on break. Will check back later to see if pt is available.

## 2019-03-14 NOTE — Progress Notes (Signed)
Speech Language Pathology Treatment: Dysphagia  Patient Details Name: Rebecca Bruce MRN: [id number] DOB: 05/10/1960 Today's Date: 03/14/2019 Time: 0727-0742 SLP Time Calculation (min) (ACUTE ONLY): 15 min  Assessment / Plan / Recommendation Clinical Impression  Pt is alert this morning but not accepting POs as she did on previous date. SLP provided hand'-over-hand assist to attempt self-feeding. When liquids would touch her lips, she would not open her lips to allow them to pass into her oral cavity. Instead, they would spill down her chin, away from her mouth. Attempted Max multimodal cues and stimulation but could not get her to drink. Discussed with RN that I would leave her full liquid diet in place to be able to offer her sips intermittently given that she demonstrated on previous date that she is capable of swallowing some when accepting; however, would not expect her to meet all her nutritional needs on this diet at this time. Will continue to follow for tolerance as well as readiness to advance to more solid consistencies pending improvements in oral acceptance and manipulation.   HPI HPI: Patient is a 58 y.o. female with PMHx of hypothyroidism, HTN, intellectual disability-lives in a group home for the past 20 years (normally fairly independent and talks)-brought in to [facility] on 11/28 for altered mental status, poor oral intake-further evaluation revealed COVID-19 pneumonia.  Has been encephalopathic, alert but resists caregivers, does not interact      SLP Plan  Continue with current plan of care       Recommendations  Diet recommendations: Thin liquid Liquids provided via: Cup Medication Administration: Via alternative means Supervision: Full supervision/cueing for compensatory strategies;Staff to assist with self feeding Compensations: Slow rate;Small sips/bites Postural Changes and/or Swallow Maneuvers: Seated upright 90 degrees                Oral Care  Recommendations: Oral care QID Follow up Recommendations: 24 hour supervision/assistance SLP Visit Diagnosis: Dysphagia, unspecified (R13.10) Plan: Continue with current plan of care       GO                Virl Axe Johnn Krasowski 03/14/2019, 7:47 AM  Ivar Drape, M.A. CCC-SLP Acute Herbalist [phone] Office [phone]

## 2019-03-14 NOTE — Progress Notes (Signed)
Pt transferred to stretcher with [facility] and [facility] Bergeron who are taking over care of pt.  NAD noted, VSS, pt wheeled off floor to be transported to [facility] for MRI. 03/14/2019 [place], RN

## 2019-03-14 NOTE — Plan of Care (Signed)
Pt withdrawn, attempt to update on POC, UTA education

## 2019-03-14 NOTE — Procedures (Signed)
Patient Name: Rebecca Bruce  MRN: [id number]  Epilepsy Attending: Lora Havens  Referring Physician/Provider: Dr. Royanne Foots Date: 03/14/2019 Duration: 22.58 minutes  Patient history: 58 year old female with altered mental status.  EEG to evaluate for seizures.  Level of alertness: Asleep/confused  AEDs during EEG study: Lorazepam  Technical aspects: This EEG study was done with scalp electrodes positioned according to the 10-20 International system of electrode placement. Electrical activity was acquired at a sampling rate of 500Hz  and reviewed with a high frequency filter of 70Hz  and a low frequency filter of 1Hz . EEG data were recorded continuously and digitally stored.   Description: No clear posterior dominant rhythm was seen.  EEG showed continuous generalized 2 to 5 Hz theta-delta slowing.  At times triphasic waves, marked generalized, were also noted.  EEG was reactive to tactile stimulation.  Hyperventilation and photic stimulation were not performed.  Abnormality -Continuous slow, generalized -Triphasic waves, generalized  IMPRESSION: This study is suggestive of moderate to severe diffuse encephalopathy, nonspecific to etiology but could be secondary to toxic-metabolic causes. No seizures or definite epileptiform discharges were seen throughout the recording.

## 2019-03-14 NOTE — Progress Notes (Signed)
RN sent to [facility] with pt. fpr a MRI. Pt. Was on a monitor. Pt. Stable. 1 mg of Ativan IV given prior to MRI. Pt. Tolerated well. Vitals stable. Back at [place] around 20:15. Report given to [facility].

## 2019-03-14 NOTE — Progress Notes (Signed)
   03/14/19 0212  MEWS Score  Resp (!) 35  ECG Heart Rate 82  Pulse Rate 81  BP (!) 166/93  SpO2 100 %  MEWS Score  MEWS RR 2  MEWS Pulse 0  MEWS Systolic 0  MEWS LOC 2  MEWS Temp 0  MEWS Score 4  MEWS Score Color Red

## 2019-03-14 NOTE — Progress Notes (Signed)
EEG complete - results pending 

## 2019-03-14 NOTE — Progress Notes (Signed)
   03/14/19 0200  MEWS Score  Resp (!) 29  ECG Heart Rate 64  Pulse Rate 64  SpO2 98 %  MEWS Score  MEWS RR 2  MEWS Pulse 0  MEWS Systolic 0  MEWS LOC 2  MEWS Temp 0  MEWS Score 4  MEWS Score Color Red  MEWS Assessment  Is this an acute change? No  MEWS guidelines implemented *See Row Information* Red  Rapid Response Notification  Name of Rapid Response RN Notified tameran   Date Rapid Response Notified 03/14/19  Time Rapid Response Notified 0130  Provider Notification  Provider Name/Title Oyd  Date Provider Notified 03/14/19  Time Provider Notified 0221  Notification Type Page  Notification Reason Change in status

## 2019-03-15 ENCOUNTER — Inpatient Hospital Stay (HOSPITAL_COMMUNITY): Payer: Medicare Other

## 2019-03-15 DIAGNOSIS — Z01818 Encounter for other preprocedural examination: Secondary | ICD-10-CM

## 2019-03-15 DIAGNOSIS — Z8616 Personal history of COVID-19: Secondary | ICD-10-CM

## 2019-03-15 DIAGNOSIS — U071 COVID-19: Secondary | ICD-10-CM

## 2019-03-15 DIAGNOSIS — J9601 Acute respiratory failure with hypoxia: Secondary | ICD-10-CM

## 2019-03-15 DIAGNOSIS — F061 Catatonic disorder due to known physiological condition: Secondary | ICD-10-CM

## 2019-03-15 LAB — POCT I-STAT 7, (LYTES, BLD GAS, ICA,H+H)
Acid-Base Excess: 1 mmol/L (ref 0.0–2.0)
Bicarbonate: 25.7 mmol/L (ref 20.0–28.0)
Calcium, Ion: 1.12 mmol/L — ABNORMAL LOW (ref 1.15–1.40)
HCT: 41 % (ref 36.0–46.0)
Hemoglobin: 13.9 g/dL (ref 12.0–15.0)
O2 Saturation: 100 %
Potassium: 3.1 mmol/L — ABNORMAL LOW (ref 3.5–5.1)
Sodium: 141 mmol/L (ref 135–145)
TCO2: 27 mmol/L (ref 22–32)
pCO2 arterial: 42.5 mmHg (ref 32.0–48.0)
pH, Arterial: 7.39 (ref 7.350–7.450)
pO2, Arterial: 247 mmHg — ABNORMAL HIGH (ref 83.0–108.0)

## 2019-03-15 LAB — CBC WITH DIFFERENTIAL/PLATELET
Abs Immature Granulocytes: 0.06 10*3/uL (ref 0.00–0.07)
Basophils Absolute: 0 10*3/uL (ref 0.0–0.1)
Basophils Relative: 0 %
Eosinophils Absolute: 0 10*3/uL (ref 0.0–0.5)
Eosinophils Relative: 0 %
HCT: 47.1 % — ABNORMAL HIGH (ref 36.0–46.0)
Hemoglobin: 14.9 g/dL (ref 12.0–15.0)
Immature Granulocytes: 1 %
Lymphocytes Relative: 11 %
Lymphs Abs: 0.9 10*3/uL (ref 0.7–4.0)
MCH: 26.2 pg (ref 26.0–34.0)
MCHC: 31.6 g/dL (ref 30.0–36.0)
MCV: 82.8 fL (ref 80.0–100.0)
Monocytes Absolute: 0.7 10*3/uL (ref 0.1–1.0)
Monocytes Relative: 10 %
Neutro Abs: 5.8 10*3/uL (ref 1.7–7.7)
Neutrophils Relative %: 78 %
Platelets: 313 10*3/uL (ref 150–400)
RBC: 5.69 MIL/uL — ABNORMAL HIGH (ref 3.87–5.11)
RDW: 14.6 % (ref 11.5–15.5)
WBC: 7.5 10*3/uL (ref 4.0–10.5)
nRBC: 0 % (ref 0.0–0.2)

## 2019-03-15 LAB — COMPREHENSIVE METABOLIC PANEL
ALT: 42 U/L (ref 0–44)
AST: 39 U/L (ref 15–41)
Albumin: 3 g/dL — ABNORMAL LOW (ref 3.5–5.0)
Alkaline Phosphatase: 59 U/L (ref 38–126)
Anion gap: 11 (ref 5–15)
BUN: 20 mg/dL (ref 6–20)
CO2: 24 mmol/L (ref 22–32)
Calcium: 8.2 mg/dL — ABNORMAL LOW (ref 8.9–10.3)
Chloride: 106 mmol/L (ref 98–111)
Creatinine, Ser: 0.46 mg/dL (ref 0.44–1.00)
GFR calc Af Amer: >60 mL/min (ref >60)
GFR calc non Af Amer: >60 mL/min (ref >60)
Glucose, Bld: 102 mg/dL — ABNORMAL HIGH (ref 70–99)
Potassium: 3.6 mmol/L (ref 3.5–5.1)
Sodium: 141 mmol/L (ref 135–145)
Total Bilirubin: 1 mg/dL (ref 0.3–1.2)
Total Protein: 6.6 g/dL (ref 6.5–8.1)

## 2019-03-15 LAB — PHOSPHORUS: Phosphorus: 2.8 mg/dL (ref 2.5–4.6)

## 2019-03-15 LAB — MAGNESIUM: Magnesium: 2.1 mg/dL (ref 1.7–2.4)

## 2019-03-15 LAB — C-REACTIVE PROTEIN: CRP: 1.4 mg/dL — ABNORMAL HIGH (ref <1.0)

## 2019-03-15 LAB — D-DIMER, QUANTITATIVE: D-Dimer, Quant: 3.02 ug/mL-FEU — ABNORMAL HIGH (ref 0.00–0.50)

## 2019-03-15 LAB — GLUCOSE, CAPILLARY
Glucose-Capillary: 118 mg/dL — ABNORMAL HIGH (ref 70–99)
Glucose-Capillary: 93 mg/dL (ref 70–99)
Glucose-Capillary: 140 mg/dL — ABNORMAL HIGH (ref 70–99)
Glucose-Capillary: 159 mg/dL — ABNORMAL HIGH (ref 70–99)

## 2019-03-15 LAB — FERRITIN: Ferritin: 852 ng/mL — ABNORMAL HIGH (ref 11–307)

## 2019-03-15 IMAGING — DX DG CHEST 1V PORT
1 series · 1 of 1 positions shown · non-contrast
Comparison: Chest radiograph dated [DATE].

CLINICAL DATA: 58-year-old female status post intubation.

EXAM:
PORTABLE CHEST 1 VIEW

[chest ap]
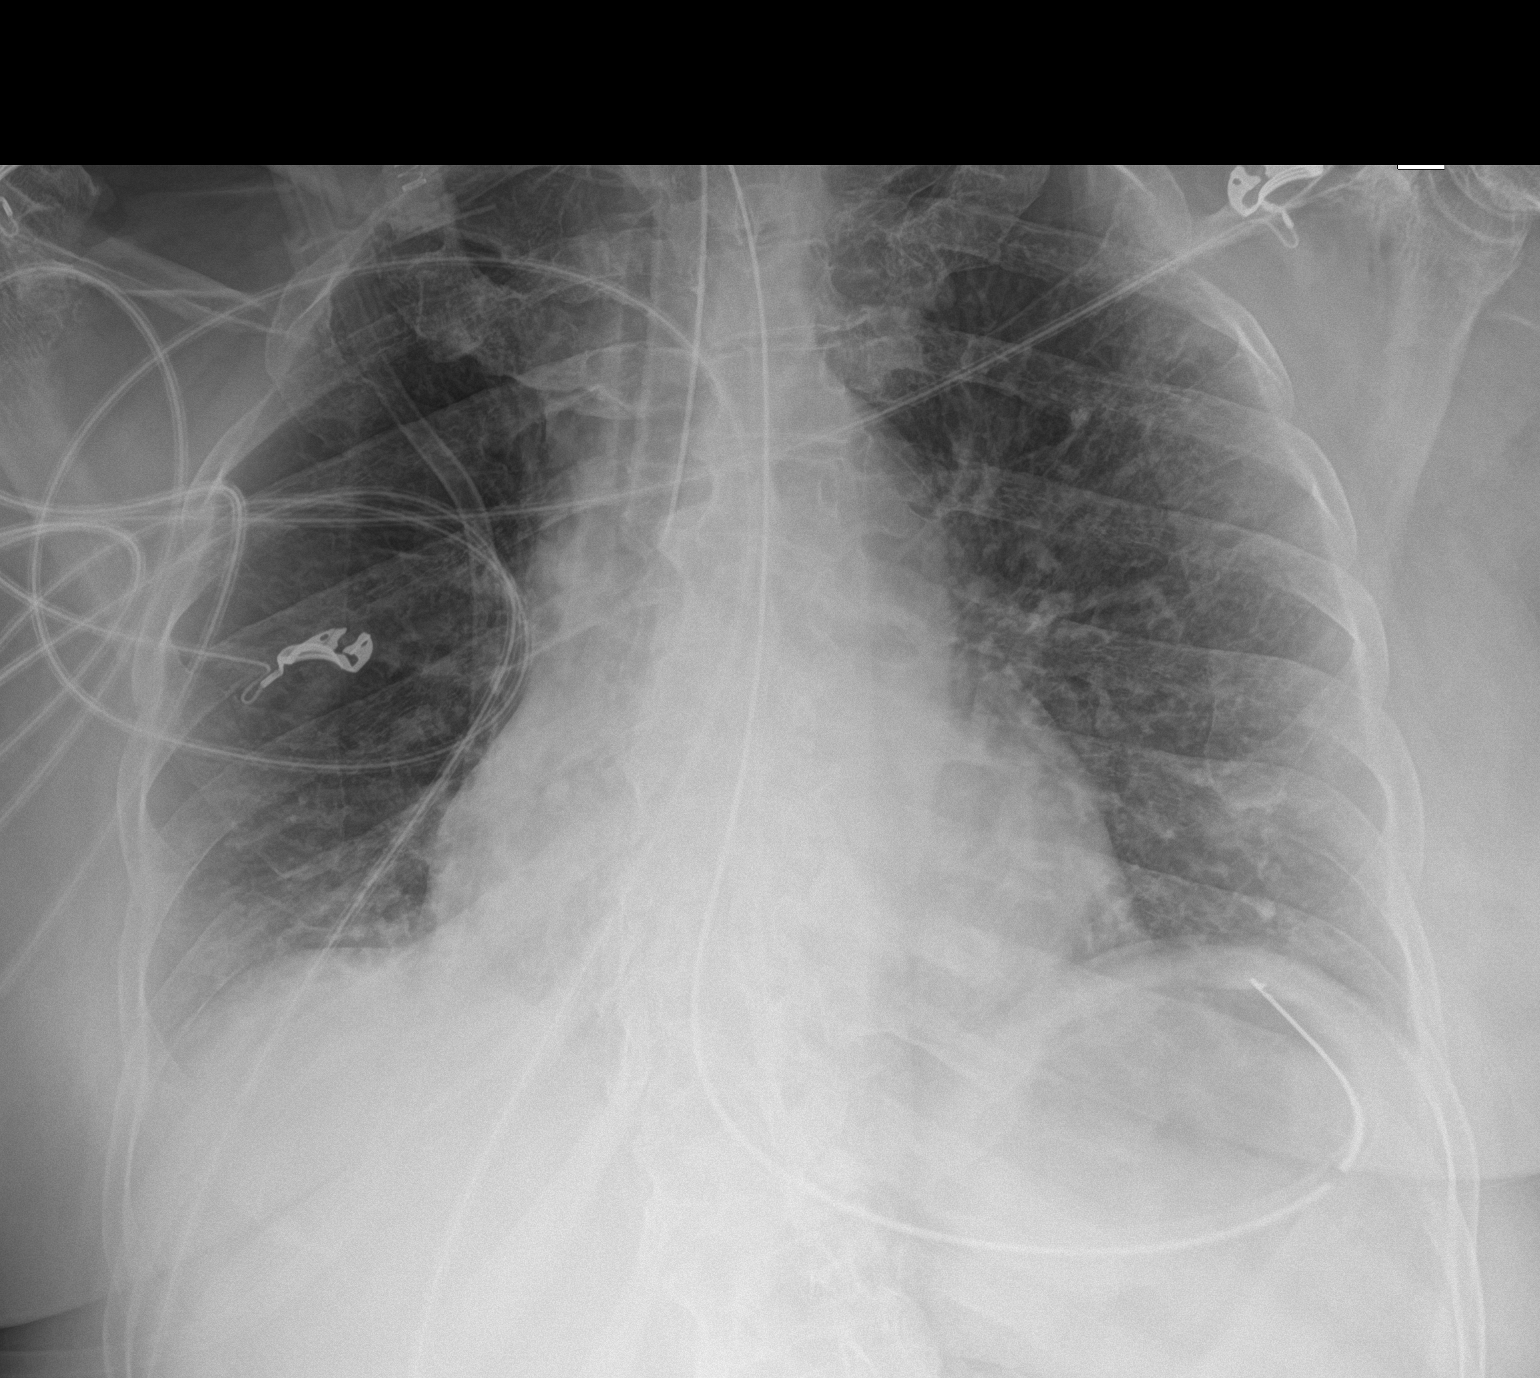

[1 of 1 positions shown; findings below may reference images not displayed]

FINDINGS: Endotracheal tube with tip close to the carina tilting towards the
right mainstem bronchus. Recommend retraction by approximately 4 cm.
Enteric tube extends below the diaphragm with tip in the gastric
fundus.

No focal consolidation, pleural effusion, or pneumothorax. Stable
cardiac silhouette. No acute osseous pathology.
IMPRESSION: Endotracheal tube with tip close to the carina tilting towards the
right mainstem bronchus. Recommend retraction by 4 cm.

These results were called by telephone at the time of interpretation
on [DATE] at [DATE] to provider Dr TARLA who verbally
acknowledged these results.

## 2019-03-15 MED ORDER — VITAL HIGH PROTEIN PO LIQD
1000.0000 mL | ORAL | Status: DC
  Administered 2019-03-15: 1000 mL

## 2019-03-15 MED ORDER — ASCORBIC ACID 500 MG PO TABS
500.0000 mg | ORAL_TABLET | Freq: Every day | ORAL | Status: DC
  Administered 2019-03-16 – 2019-04-15 (×31): 500 mg
  Filled 2019-03-15 (×31): qty 1

## 2019-03-15 MED ORDER — METRONIDAZOLE IN NACL 5-0.79 MG/ML-% IV SOLN
500.0000 mg | Freq: Three times a day (TID) | INTRAVENOUS | Status: DC
  Administered 2019-03-15 – 2019-03-19 (×12): 500 mg via INTRAVENOUS
  Filled 2019-03-15 (×13): qty 100

## 2019-03-15 MED ORDER — LORAZEPAM 2 MG/ML IJ SOLN: 1.0000 mg | Freq: Four times a day (QID) | INTRAMUSCULAR | Status: DC | PRN

## 2019-03-15 MED ORDER — ZINC SULFATE 220 (50 ZN) MG PO CAPS
220.0000 mg | ORAL_CAPSULE | Freq: Every day | ORAL | Status: DC
  Administered 2019-03-16 – 2019-04-15 (×31): 220 mg
  Filled 2019-03-15 (×32): qty 1

## 2019-03-15 MED ORDER — CLONAZEPAM 0.1 MG/ML ORAL SUSPENSION: 2.0000 mg | Freq: Three times a day (TID) | ORAL | Status: DC

## 2019-03-15 MED ORDER — FAMOTIDINE IN NACL 20-0.9 MG/50ML-% IV SOLN
20.0000 mg | Freq: Two times a day (BID) | INTRAVENOUS | Status: DC
  Administered 2019-03-15: 20 mg via INTRAVENOUS
  Filled 2019-03-15: qty 50

## 2019-03-15 MED ORDER — FENTANYL CITRATE (PF) 100 MCG/2ML IJ SOLN
50.0000 ug | INTRAMUSCULAR | Status: DC | PRN
  Administered 2019-03-16: 08:00:00 100 ug via INTRAVENOUS

## 2019-03-15 MED ORDER — ROCURONIUM BROMIDE 50 MG/5ML IV SOLN: 100.0000 mg | Freq: Once | INTRAVENOUS | Status: DC

## 2019-03-15 MED ORDER — MIDAZOLAM HCL 2 MG/2ML IJ SOLN
INTRAMUSCULAR | Status: AC
  Administered 2019-03-15: 17:00:00
  Filled 2019-03-15: qty 2

## 2019-03-15 MED ORDER — MIDAZOLAM HCL 2 MG/2ML IJ SOLN
2.0000 mg | INTRAMUSCULAR | Status: DC | PRN
  Administered 2019-03-16: 2 mg via INTRAVENOUS

## 2019-03-15 MED ORDER — FENTANYL CITRATE (PF) 100 MCG/2ML IJ SOLN
50.0000 ug | INTRAMUSCULAR | Status: DC | PRN
  Administered 2019-03-15 (×2): 50 ug via INTRAVENOUS
  Filled 2019-03-15 (×2): qty 2

## 2019-03-15 MED ORDER — ROCURONIUM BROMIDE 10 MG/ML (PF) SYRINGE
PREFILLED_SYRINGE | INTRAVENOUS | Status: AC
  Administered 2019-03-15: 17:00:00
  Filled 2019-03-15: qty 10

## 2019-03-15 MED ORDER — CHLORHEXIDINE GLUCONATE CLOTH 2 % EX PADS
6.0000 | MEDICATED_PAD | Freq: Every day | CUTANEOUS | Status: DC
  Administered 2019-03-15 – 2019-04-21 (×36): 6 via TOPICAL

## 2019-03-15 MED ORDER — ENOXAPARIN SODIUM 40 MG/0.4ML ~~LOC~~ SOLN: 40.0000 mg | SUBCUTANEOUS | Status: DC

## 2019-03-15 MED ORDER — CHLORHEXIDINE GLUCONATE 0.12% ORAL RINSE (MEDLINE KIT)
15.0000 mL | Freq: Two times a day (BID) | OROMUCOSAL | Status: DC
  Administered 2019-03-15 – 2019-03-25 (×20): 15 mL via OROMUCOSAL

## 2019-03-15 MED ORDER — DOCUSATE SODIUM 50 MG/5ML PO LIQD
100.0000 mg | Freq: Every day | ORAL | Status: DC
  Administered 2019-03-15 – 2019-04-13 (×27): 100 mg
  Filled 2019-03-15 (×29): qty 10

## 2019-03-15 MED ORDER — PRO-STAT SUGAR FREE PO LIQD
30.0000 mL | Freq: Two times a day (BID) | ORAL | Status: DC
  Administered 2019-03-15 – 2019-03-25 (×20): 30 mL
  Filled 2019-03-15 (×20): qty 30

## 2019-03-15 MED ORDER — ETOMIDATE 2 MG/ML IV SOLN: 20.0000 mg | Freq: Once | INTRAVENOUS | Status: DC

## 2019-03-15 MED ORDER — LORAZEPAM 2 MG/ML IJ SOLN: 1.0000 mg | Freq: Three times a day (TID) | INTRAMUSCULAR | Status: DC

## 2019-03-15 MED ORDER — MIDAZOLAM HCL 2 MG/2ML IJ SOLN: 2.0000 mg | Freq: Once | INTRAMUSCULAR | Status: DC

## 2019-03-15 MED ORDER — MIDAZOLAM HCL 2 MG/2ML IJ SOLN
2.0000 mg | INTRAMUSCULAR | Status: DC | PRN
  Administered 2019-03-16 (×2): 2 mg via INTRAVENOUS
  Filled 2019-03-15 (×4): qty 2

## 2019-03-15 MED ORDER — ETOMIDATE 2 MG/ML IV SOLN
INTRAVENOUS | Status: AC
  Administered 2019-03-15: 17:00:00
  Filled 2019-03-15: qty 10

## 2019-03-15 MED ORDER — ORAL CARE MOUTH RINSE
15.0000 mL | OROMUCOSAL | Status: DC
  Administered 2019-03-15 – 2019-03-25 (×92): 15 mL via OROMUCOSAL

## 2019-03-15 MED ORDER — FLORANEX PO PACK
1.0000 g | PACK | Freq: Every day | ORAL | Status: DC
  Administered 2019-03-16 – 2019-03-24 (×9): 1 g
  Filled 2019-03-15 (×14): qty 1

## 2019-03-15 MED ORDER — FENTANYL CITRATE (PF) 100 MCG/2ML IJ SOLN
INTRAMUSCULAR | Status: AC
  Administered 2019-03-15: 17:00:00
  Filled 2019-03-15: qty 2

## 2019-03-15 MED ORDER — ATORVASTATIN CALCIUM 10 MG PO TABS
20.0000 mg | ORAL_TABLET | Freq: Every day | ORAL | Status: DC
  Administered 2019-03-15 – 2019-03-27 (×13): 20 mg
  Filled 2019-03-15: qty 1
  Filled 2019-03-15: qty 2
  Filled 2019-03-15 (×2): qty 1
  Filled 2019-03-15: qty 2
  Filled 2019-03-15: qty 1
  Filled 2019-03-15 (×2): qty 2
  Filled 2019-03-15 (×3): qty 1
  Filled 2019-03-15: qty 2
  Filled 2019-03-15: qty 1

## 2019-03-15 MED ORDER — SODIUM CHLORIDE 0.9 % IV BOLUS
1000.0000 mL | Freq: Once | INTRAVENOUS | Status: AC
  Administered 2019-03-15: 1000 mL via INTRAVENOUS

## 2019-03-15 MED ORDER — FENTANYL CITRATE (PF) 100 MCG/2ML IJ SOLN: 100.0000 ug | Freq: Once | INTRAMUSCULAR | Status: DC

## 2019-03-15 MED ORDER — CLONAZEPAM 1 MG PO TABS
2.0000 mg | ORAL_TABLET | Freq: Three times a day (TID) | ORAL | Status: DC
  Administered 2019-03-15 – 2019-03-20 (×17): 2 mg
  Filled 2019-03-15 (×2): qty 2
  Filled 2019-03-15: qty 4
  Filled 2019-03-15 (×14): qty 2

## 2019-03-15 NOTE — Consult Note (Signed)
NP attempted to do teleassessment.  Was reported RN Roselyn Reef was not available.  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  NP spoke to the [facility] who provided east side [phone] no answer.  -As reported by MD Evalee Mutton would like a psych consult due to nonverbal presentation.  Reported patient resides in a group home and is normally alert and talkative.  Case staffed with attending psychiatrist MD Dwyane Dee who recommends low-dose Klonopin for catatonia.  Continue with medical clearance and neurology consult. Chart reviewed " patient is withdrawn and nonverbal."     Per admission assessment note:      HPI: Patient is a 58 y.o. female with PMHx of hypothyroidism, HTN, intellectual disability-lives in a group home for the past 20 years (normally fairly independent and talks)-brought in to [facility] on 11/28 for altered mental status, poor oral intake-further evaluation revealed COVID-19 pneumonia.  Has been encephalopathic, alert but resists caregivers, does not interact       T.Lolah Coghlan NP  03/15/2019

## 2019-03-15 NOTE — Progress Notes (Signed)
Pt arrived to the unit. Withdraws from the pain only from sternum rub. No movements of extremities. 3L [place], Does not follow commands. BP stable. Was intubated at the bedside. NG tube placed, CHG bath given.No belongings at the bedside. Bed in low position, alarms are on, frequent turns, mouth care. Continue to monitor.

## 2019-03-15 NOTE — Progress Notes (Signed)
Initial Nutrition Assessment  DOCUMENTATION CODES:   Not applicable  INTERVENTION:   Recommend initiate Vital 1.2 @ 55 ml/hr via OG tube 30 ml Prostat BID  Provides: 1784 kcal, 129 grams protein, and 1070 ml free water.    NUTRITION DIAGNOSIS:   Increased nutrient needs related to acute illness(COVID PNA) as evidenced by estimated needs.  GOAL:   Patient will meet greater than or equal to 90% of their needs  MONITOR:   Vent status, I & O's  REASON FOR ASSESSMENT:   (Cortrak request)    ASSESSMENT:   Pt with PMH of hypothyroidism, HTN, intellectual disability who lives in a group home. Pt admitted with AMS, poor oral intake, and COVID-19 PNA.   Noted cortrak tube request in system. Pt unresponsive today. Per RN notes unable to place NG tube. Noted that pt has no gag reflex and presumed aspiration. Pt being tx to ICU for intubation.    Patient is currently intubated on ventilator support MV: 10 L/min Temp (24hrs), Avg:98.1 F (36.7 C), Min:97.4 F (36.3 C), Max:99.5 F (37.5 C)  Medications reviewed and include: decadron, colace, vitamin c, zinc  Labs reviewed    NUTRITION - FOCUSED PHYSICAL EXAM:  Deferred   Diet Order:   Diet Order            Diet NPO time specified  Diet effective now              EDUCATION NEEDS:   No education needs have been identified at this time  Skin:  Skin Assessment: Reviewed RN Assessment  Last BM:  12/4 small  Height:   Ht Readings from Last 1 Encounters:  03/12/19 5\' 2"  (1.575 m)    Weight:   Wt Readings from Last 1 Encounters:  03/12/19 85 kg    Ideal Body Weight:     BMI:  Body mass index is 34.27 kg/m.  Estimated Nutritional Needs:   Kcal:  1700  Protein:  110-130 grams  Fluid:  >1.7 L/day  Maylon Peppers RD, LDN, CNSC [phone] Pager [phone] After Hours Pager

## 2019-03-15 NOTE — Plan of Care (Addendum)
Pt withdrawn, does not speak, pt will will not engage.

## 2019-03-15 NOTE — Progress Notes (Signed)
Transferred patient to [place] per MD. Pt condition unchanged. Bedside report Given to [facility].

## 2019-03-15 NOTE — Progress Notes (Signed)
PT Cancellation Note  Patient Details Name: Rebecca Bruce MRN: [id number] DOB: Nov 11, 1960   Cancelled Treatment:    Reason Eval/Treat Not Completed: Medical issues which prohibited therapy.  RN asked PT to hold.  Pt transferring to ICU.  Some talk of possible intubation.  PT will check back on pt on Monday 03/18/19.  Thanks,  Verdene Lennert, PT, DPT  Acute Rehabilitation [phone] pager [phone] office  @ [facility]: [phone]     Harvie Heck 03/15/2019, 5:19 PM

## 2019-03-15 NOTE — Progress Notes (Addendum)
PROGRESS NOTE                                                                                                                                                                                                             Patient Demographics:    Rebecca Bruce, is a 58 y.o. female, DOB - 1960-11-13, KTG:[id number]  Outpatient Primary MD for the patient is Steele Sizer, MD   Admit date - 03/11/2019   LOS - 4  No chief complaint on file.      Brief Narrative: Patient is a 58 y.o. female with PMHx of hypothyroidism, HTN, intellectual disability-lives in a group home for the past 20 years (normally fairly independent and talks)-brought in to [facility] on 11/28 for altered mental status, poor oral intake-further evaluation revealed COVID-19 pneumonia.  Patient was subsequently admitted to the hospitalist service at [facility], and transferred to my service on 12/1 at [facility].  Started on steroids and Remdesivir-with significant improvement in fever-not on oxygen.  However continues to have encephalopathy-see below for further details.   Subjective:    Nocole Zammit remains sedated-she had received IV Ativan earlier-Per nursing staff-she is essentially unchanged.  She opens eyes-tracks movement but really does not engage and is nonverbal.  She has had no oral intake since admission.  Patient remains with flexed elbows and wrist-and is still pretty rigid.  However per bedside RN-she has seen patient extend her extremities out from the elbows.   Assessment  & Plan :   COVID-19 viral pneumonia: Improving from the standpoint-she is afebrile-on room air.  CRP continues to downtrend.  Will finish remdesivir today-begin tapering steroids.    Fever: afebrile  O2 requirements:  SpO2: 93 % on room air  COVID-19 Labs: Recent Labs    03/13/19 0100 03/14/19 0100 03/15/19 0047  DDIMER 7.28* 3.81* 3.02*  FERRITIN 1,114* 1,216* 852*  CRP 4.1* 2.5*  1.4*    No results found for: BNP  Recent Labs  Lab 03/10/19 0527 03/11/19 0449  PROCALCITON <0.10 <0.10    Lab Results  Component Value Date   SARSCOV2NAA POSITIVE (A) 03/09/2019     COVID-19 Medications: Steroids: 11/29>> Remdesivir:  11/29>> 12/3 Actemra: Not given due to lack of hypoxemia Convalescent Plasma: Not given  Other medications: Diuretics:Euvolemic-no need for lasix Antibiotics: Vancomycin: 11/28>> 12/1 Meropenem: 11/28>> 12/2 Vancomycin: 12/2>> Rocephin: 12/2>>  CBG's stable on SSI while on steroid (A1c 6.1 on 11/28)  Prone/Incentive Spirometry: Doubt will be cooperative.  DVT Prophylaxis  :  Lovenox   Acute metabolic encephalopathy superimposed intellectual disability: She has remained essentially unchanged for the past few days-when awake-she will track her movement, blinks eyes to sudden startling movements.  She appears to have generalized rigidity-and will keep her upper extremities flexed at the elbow-and will resist attempts to extend.  Per bedside RN-she has noted-and seen the patient extend her elbow occasionally.  MRI brain without any acute abnormalities, EEG without any seizures.  Although felt to be unlikely-she is on empiric antimicrobial therapy for meningitis.  She has been on IV Ativan-scheduled dosing for the past 2-3 days without any significant change.  Spoke at length with Dr. Lurlean Nanny not appear to be an organic issue at this point-suspicion for catatonic/withdrawal states-have consulted psychiatry (spoke with psychiatry NP on call)  Have reached out to patient's ex-sister-in-law (patient's brother is deaf)-who will get in touch with the patient's brother-and see if he can call multiple times a day-to reassure the patient.  Sepsis: Probably secondary to COVID-19-urine cultures are inconclusive-blood cultures negative.  Empiric vancomycin/Rocephin-for presumed meningitis-although considered low probability given overall clinical  situation.  Hypothyroidism: No significant oral intake-continue IV levothyroxine-switch back to oral when oral intake resumes.  HTN: Better controlled-continue scheduled IV Lopressor-and also on as needed hydralazine.  Dyslipidemia: Resume statins when able-when oral intake returns  Nutrition: Has had no oral intake for the past few days-will ask RN to place an NG tube-and if can be inserted-we will need to start NG feedings.Cortrak tube team only available on Wednesdays.  Have spoken with the patient's sister-in-law-she is agreeable.  Obesity: Estimated body mass index is 34.27 kg/m as calculated from the following:   Height as of this encounter: _0  (1.575 m).   Weight as of this encounter: 85 kg.   Consults  :  None  Procedures  :  None  ABG: No results found for: PHART, PCO2ART, PO2ART, HCO3, TCO2, ACIDBASEDEF, O2SAT  Vent Settings:    Condition - Stable  Family Communication  : Sister-in-law updated over the phone on 12/3  Code Status :  Full Code/  Diet :  Diet Order            Diet full liquid Room service appropriate? Yes; Fluid consistency: Thin  Diet effective now               Disposition Plan  :  Remain hospitalized  Barriers to discharge: complete 5 days of IV Remdesivir/needs improvement in mental status  Antimicorbials  :    Anti-infectives (From admission, onward)   Start     Dose/Rate Route Frequency Ordered Stop   03/14/19 0600  vancomycin (VANCOCIN) IVPB 1000 mg/200 mL premix     1,000 mg 200 mL/hr over 60 Minutes Intravenous Every 12 hours 03/13/19 1618 03/19/19 0559   03/13/19 2200  cefTRIAXone (ROCEPHIN) 2 g in sodium chloride 0.9 % 100 mL IVPB     2 g 200 mL/hr over 30 Minutes Intravenous Every 12 hours 03/13/19 1618 03/19/19 0959   03/13/19 1630  vancomycin (VANCOCIN) 2,000 mg in sodium chloride 0.9 % 500 mL IVPB     2,000 mg 250 mL/hr over 120 Minutes Intravenous  Once  03/13/19 1618 03/14/19 0700   03/12/19 1800  vancomycin  (VANCOCIN) 1,750 mg in sodium chloride 0.9 % 500 mL IVPB  Status:  Discontinued     1,750 mg 250 mL/hr over 120 Minutes Intravenous Every 24 hours 03/11/19 1938 03/12/19 0903   03/12/19 1000  remdesivir 100 mg in sodium chloride 0.9 % 250 mL IVPB     100 mg 500 mL/hr over 30 Minutes Intravenous Every 24 hours 03/11/19 1943 03/14/19 1055   03/11/19 2200  meropenem (MERREM) 1 g in sodium chloride 0.9 % 100 mL IVPB  Status:  Discontinued     1 g 200 mL/hr over 30 Minutes Intravenous Every 8 hours 03/11/19 1938 03/13/19 1604      Inpatient Medications  Scheduled Meds:  atorvastatin  20 mg Oral q1800   dexamethasone  4 mg Intravenous Q24H   docusate sodium  100 mg Oral Daily   [START ON 03/16/2019] enoxaparin (LOVENOX) injection  40 mg Subcutaneous Q24H   insulin aspart  0-9 Units Subcutaneous TID WC   Ipratropium-Albuterol  1 puff Inhalation Q6H   levothyroxine  50 mcg Intravenous Daily   LORazepam  1 mg Intravenous Q8H   metoprolol tartrate  5 mg Intravenous Q6H   saccharomyces boulardii  250 mg Oral Daily   sodium chloride flush  10-40 mL Intracatheter Q12H   vitamin C  500 mg Oral Daily   zinc sulfate  220 mg Oral Daily   Continuous Infusions:  sodium chloride Stopped (03/12/19 1210)   sodium chloride 75 mL/hr at 03/15/19 1134   cefTRIAXone (ROCEPHIN)  IV Stopped (03/15/19 5462)   vancomycin Stopped (03/15/19 0719)   PRN Meds:.sodium chloride, acetaminophen, chlorpheniramine-HYDROcodone, guaiFENesin-dextromethorphan, hydrALAZINE, sodium chloride flush   Time Spent in minutes  25  See all Orders from today for further details   Oren Binet M.D on 03/15/2019 at 11:56 AM  To page go to [email or web address] - use universal password  [facility] -  Office  [phone]    Objective:   Vitals:   03/15/19 0400 03/15/19 0407 03/15/19 0754 03/15/19 1131  BP: (!) 151/91 (!) 151/91 (!) 146/92 (!) 159/89  Pulse: 64  64 79  Resp: _0 Temp: 97.8 F  (36.6 C) 97.9 F (36.6 C) (!) 97.4 F (36.3 C) 97.8 F (36.6 C)  TempSrc: Axillary Axillary Axillary Axillary  SpO2: 93%  95% 93%  Weight:      Height:        Wt Readings from Last 3 Encounters:  03/12/19 85 kg  03/11/19 84.6 kg  03/08/19 90.7 kg     Intake/Output Summary (Last 24 hours) at 03/15/2019 1156 Last data filed at 03/15/2019 1134 Gross per 24 hour  Intake 1778.06 ml  Output 1200 ml  Net 578.06 ml     Physical Exam Gen Exam: Sedated this morning but comfortable.  Opens eyes briefly-seems to track my movement. HEENT:atraumatic, normocephalic Chest: B/L clear to auscultation anteriorly CVS:S1S2 regular Abdomen:soft non tender, non distended Extremities:no edema Neurology: Continues to have rigidity-legs extended-however she is flexed at the elbow-and will not allow me to extend it.  As noted above-nursing staff have noted/seen the patient extend the elbow. Skin: no rash   Data Review:    CBC Recent Labs  Lab 03/09/19 1215  03/11/19 0449 03/12/19 0246 03/13/19 0100 03/14/19 0100 03/15/19 0047  WBC 6.4   < > 4.0 6.6 8.6 8.2 7.5  HGB 14.9   < > 14.2 15.2* 15.3* 14.9 14.9  HCT 44.3   < >  42.8 46.3* 47.3* 47.0* 47.1*  PLT 223   < > 198 255 297 300 313  MCV 78.0*   < > 79.3* 80.2 81.6 82.9 82.8  MCH 26.2   < > 26.3 26.3 26.4 26.3 26.2  MCHC 33.6   < > 33.2 32.8 32.3 31.7 31.6  RDW 13.9   < > 13.8 14.1 14.4 14.6 14.6  LYMPHSABS 0.8  --   --  0.6* 0.8 0.9 0.9  MONOABS 0.7  --   --  0.6 1.0 0.8 0.7  EOSABS 0.0  --   --  0.0 0.0 0.0 0.0  BASOSABS 0.0  --   --  0.0 0.0 0.1 0.0   < > = values in this interval not displayed.    Chemistries  Recent Labs  Lab 03/11/19 0449 03/12/19 0246 03/13/19 0100 03/14/19 0100 03/15/19 0047  NA 137 139 142 143 141  K 3.2* 4.0 4.0 3.3* 3.6  CL 103 104 109 109 106  CO2 _0 GLUCOSE 138* 119* 94 102* 102*  BUN _1 CREATININE 0.49 0.62 0.61 0.55 0.46  CALCIUM 8.0* 8.4* 8.3* 8.1* 8.2*  AST  56* 63* 62* 56* 39  ALT 29 37 41 44 42  ALKPHOS 61 64 64 60 59  BILITOT 1.1 1.0 1.2 1.1 1.0   ------------------------------------------------------------------------------------------------------------------ No results for input(s): CHOL, HDL, LDLCALC, TRIG, CHOLHDL, LDLDIRECT in the last 72 hours.  Lab Results  Component Value Date   HGBA1C 6.1 (H) 03/09/2019   ------------------------------------------------------------------------------------------------------------------ No results for input(s): TSH, T4TOTAL, T3FREE, THYROIDAB in the last 72 hours.  Invalid input(s): FREET3 ------------------------------------------------------------------------------------------------------------------ Recent Labs    03/14/19 0100 03/15/19 0047  FERRITIN 1,216* 852*    Coagulation profile No results for input(s): INR, PROTIME in the last 168 hours.  Recent Labs    03/14/19 0100 03/15/19 0047  DDIMER 3.81* 3.02*    Cardiac Enzymes No results for input(s): CKMB, TROPONINI, MYOGLOBIN in the last 168 hours.  Invalid input(s): CK ------------------------------------------------------------------------------------------------------------------ No results found for: BNP  Micro Results Recent Results (from the past 240 hour(s))  Urine culture     Status: Abnormal   Collection Time: 03/08/19  2:33 PM   Specimen: Urine, Clean Catch  Result Value Ref Range Status   Specimen Description   Final    URINE, CLEAN CATCH Performed at [facility], [address] [place], [place] [address]    Special Requests   Final    NONE Performed at [facility], [address] [place], [place] [address]    Culture MULTIPLE SPECIES PRESENT, SUGGEST RECOLLECTION (A)  Final   Report Status 03/11/2019 FINAL  Final  SARS CORONAVIRUS 2 (TAT 6-24 HRS) Nasopharyngeal Nasopharyngeal Swab     Status: Abnormal   Collection Time: 03/09/19  1:24 PM   Specimen: Nasopharyngeal Swab  Result  Value Ref Range Status   SARS Coronavirus 2 POSITIVE (A) NEGATIVE Final    Comment: RESULT CALLED TO, READ BACK BY AND VERIFIED WITH: RN MAC BURNS AT 0059 ON 03/10/2019 BY MESSAN HOUEGNIFIO (NOTE) SARS-CoV-2 target nucleic acids are DETECTED. The SARS-CoV-2 RNA is generally detectable in upper and lower respiratory specimens during the acute phase of infection. Positive results are indicative of the presence of SARS-CoV-2 RNA. Clinical correlation with patient history and other diagnostic information is  necessary to determine patient infection status. Positive results do not rule out bacterial infection or co-infection with other viruses.  The expected result is  Negative. Fact Sheet for Patients: SugarRoll.be Fact Sheet for Healthcare Providers: https://www.woods-mathews.com/ This test is not yet approved or cleared by the [place] FDA and  has been authorized for detection and/or diagnosis of SARS-CoV-2 by FDA under an Emergency Use Authorization (EUA). This EUA will remain  in effect (meaning this test  can be used) for the duration of the COVID-19 declaration under Section 564(b)(1) of the Act, 21 U.S.C. section 360bbb-3(b)(1), unless the authorization is terminated or revoked sooner. Performed at [facility], [place] [address], [place], [place] [address]   Culture, blood (x 2)     Status: None   Collection Time: 03/09/19  3:17 PM   Specimen: BLOOD  Result Value Ref Range Status   Specimen Description BLOOD LEFT ANTECUBITAL  Final   Special Requests   Final    BOTTLES DRAWN AEROBIC AND ANAEROBIC Blood Culture adequate volume   Culture   Final    NO GROWTH 5 DAYS Performed at [facility], [address] [place], [place] [address]    Report Status 03/14/2019 FINAL  Final    Radiology Reports Ct Head Wo Contrast  Result Date: 03/09/2019 CLINICAL DATA:  Altered mental status EXAM: CT HEAD WITHOUT CONTRAST  TECHNIQUE: Contiguous axial images were obtained from the base of the skull through the vertex without intravenous contrast. COMPARISON:  None. FINDINGS: Brain: The ventricles are normal in size and configuration. There is no intracranial mass, hemorrhage, extra-axial fluid collection, or midline shift. The brain parenchyma appears unremarkable. No acute infarct evident. Vascular: No hyperdense vessel. There is no appreciable vascular calcification. Skull: The bony calvarium appears intact. Sinuses/Orbits: There is mucosal thickening in several ethmoid air cells. Other visualized paranasal sinuses are clear. Visualized orbits appear symmetric bilaterally. Other: Mastoid air cells are clear. IMPRESSION: Mucosal thickening in several ethmoid air cells. Study otherwise unremarkable. Electronically Signed   By: Lowella Grip III M.D.   On: 03/09/2019 13:20   Ct Head Wo Contrast  Result Date: 03/08/2019 CLINICAL DATA:  Altered level of consciousness.  Hypertension. EXAM: CT HEAD WITHOUT CONTRAST TECHNIQUE: Contiguous axial images were obtained from the base of the skull through the vertex without intravenous contrast. COMPARISON:  None. FINDINGS: Brain: The ventricles are normal in size and configuration. There is no intracranial mass, hemorrhage, extra-axial fluid collection, or midline shift. The brain parenchyma appears unremarkable. There is no evident acute infarct. Vascular: There is no hyperdense vessel. There is no appreciable vascular calcification. Skull: The bony calvarium appears intact. Sinuses/Orbits: There is mild mucosal thickening in several ethmoid air cells. Other visualized paranasal sinuses are clear. Visualized orbits appear symmetric bilaterally. Other: Visualized mastoid air cells are clear. IMPRESSION: Mild mucosal thickening in several ethmoid air cells. Study otherwise unremarkable. Electronically Signed   By: Lowella Grip III M.D.   On: 03/08/2019 14:07   Mr Jeri Cos FA  Contrast  Result Date: 03/14/2019 CLINICAL DATA:  58 year old female with encephalopathy. Altered mental status since 03/08/2019, diagnosed with COVID-19 pneumonia. EXAM: MRI HEAD WITHOUT AND WITH CONTRAST TECHNIQUE: Multiplanar, multiecho pulse sequences of the brain and surrounding structures were obtained without and with intravenous contrast. CONTRAST:  34m GADAVIST GADOBUTROL 1 MMOL/ML IV SOLN COMPARISON:  Head CTs 03/09/2019 and earlier. FINDINGS: Brain: Cerebral volume is within normal limits for age. No restricted diffusion to suggest acute infarction. No midline shift, mass effect, evidence of mass lesion, ventriculomegaly, extra-axial collection or acute intracranial hemorrhage. Cervicomedullary junction and pituitary are within normal limits. GPearline Cablesand white matter  signal is within normal limits for age throughout the brain. No convincing encephalomalacia. No chronic cerebral blood products identified. Deep gray nuclei, brainstem and cerebellum appear normal. No abnormal enhancement identified. No dural thickening. Vascular: Major intracranial vascular flow voids are preserved. Major dural venous sinuses are enhancing and appear to be patent. Skull and upper cervical spine: Negative visible cervical spine and spinal cord. Visualized bone marrow signal is within normal limits. Sinuses/Orbits: Negative orbits. Paranasal sinuses are clear. Other: Mastoids are clear. Visible internal auditory structures appear normal. Scalp and face soft tissues appear negative. IMPRESSION: Normal for age MRI appearance of the brain. Electronically Signed   By: Genevie Ann M.D.   On: 03/14/2019 19:49   Dg Chest Port 1 View  Result Date: 03/10/2019 CLINICAL DATA:  Acute mental status change. EXAM: PORTABLE CHEST 1 VIEW COMPARISON:  June 10, 2005 FINDINGS: No pneumothorax. The heart size is borderline to mildly enlarged but poorly assessed on a portable film. Hila and mediastinum are unremarkable. Bilateral patchy pulmonary  opacities are identified. No other acute abnormalities. IMPRESSION: 1. Bilateral patchy pulmonary infiltrates worrisome for pneumonia. Atypical infections should be considered. 2. No other acute abnormalities are identified. Electronically Signed   By: Dorise Bullion III M.D   On: 03/10/2019 14:56   [place] Ekg Site Rite  Result Date: 03/12/2019 If Site Rite image not attached, placement could not be confirmed due to current cardiac rhythm.

## 2019-03-15 NOTE — Progress Notes (Signed)
MD Ghimire stated he will speak to family about placing NGT and to hold Ativan for now until otherwise directed.  No new orders at this time.

## 2019-03-15 NOTE — Progress Notes (Signed)
x2 attempts to place NGT unsuccessful.  Will notify MD of attempts, report was given to [facility].

## 2019-03-15 NOTE — Progress Notes (Addendum)
On afternoon eval: Patient-with a lot of oral secretions, drooling, desaturating to the 60's and then bradying down to the 40;'s very briefly. Rapidly improved with suctioning and O2 supplementation. She however is more lethargic not following commands, and lungs sounds very wet with transmitted upper airways sounds.  I asked RT to do a deep NT suction-per RT no gag reflex.  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 Recommends that we continue Benzo's-but with lethargy/AMS and now presumed aspiration-she will not maintain her airway. Spoke with PCCM-transfer to the ICU-will likely need intubation.   I spoke with Dr Rory Percy again: recommends that we use Klonopin 1 mg TID through OG Tube, prefer's to use propofol for sedation. Avoid ativan or versed.He will evaluate tomorrow morning.   Will add flagyl, to cover anaerobe's.  Addendum: updated patient's sister in law (brother is deaf-she will update family)-regarding events of today including intubation and potential transfer to [facility]

## 2019-03-15 NOTE — TOC Progression Note (Signed)
Transition of Care [facility]) - Progression Note    Patient Details  Name: Rebecca Bruce MRN: [id number] Date of Birth: October 01, 1960  Transition of Care [facility]) CM/SW Contact  Loletha Grayer Beverely Pace, RN Phone Number: 03/15/2019, 2:58 PM  Clinical Narrative:   Case manager continuing to monitor for appropriate disposition.     Expected Discharge Plan: Group Home Barriers to Discharge: Continued Medical Work up  Expected Discharge Plan and Services Expected Discharge Plan: Group Home   Discharge Planning Services: CM Consult Post Acute Care Choice: [place] arrangements for the past 2 months: Group Home                                       Social Determinants of Health (SDOH) Interventions    Readmission Risk Interventions No flowsheet data found.

## 2019-03-15 NOTE — Progress Notes (Signed)
NAME:  Rebecca Bruce, MRN:  [id number], DOB:  Sep 05, 1960, LOS: 4 ADMISSION DATE:  03/11/2019, CONSULTATION DATE:  12/4 REFERRING MD:  Sloan Leiter, CHIEF COMPLAINT:  Inability to protect airway   Brief History   58 y/o female admitted on 11/30 on transfer from [facility] in setting of acute encephalopathy, noted to be COVID positive.  Has developed catatonia and ultimately required intubation for inability to protect her airway on 12/4.    History of present illness / CONSULTATION NOTE  58 y/o female brought to [facility] on 11/28 in the setting of altered mental status from her group home.  She was found to have COVID pneumonia, treated with decadron and remdesivir then sent to [facility] on 11/30.  Here has had progressive increase in acute encephalopathy and has become less and less responsive.  Today PCCM was asked to see her because she reached the point that she was aspirating and not responding to pharyngeal/tracheal suctioning.  By report her exam findings have waxed and waned over the last three days but have not generally moved in the right direction.  Neurology saw her on 12/2 and apparently at that time she followed some simple commands, but she was non-verbal at the time with significant rigidity noted in her upper extremities.   At baseline lives in a group home, can bathe herself and have a conversation.  Has baseline anxiety and depression.    Past Medical History  Thyroid disease Hypertension GERD  Significant Hospital Events   11/28 admission [facility] 11/30 transfer to [facility] 12/4 intubation for inability to protect airway  Consults:  Neurology PCCM  Procedures:  12/4 ETT >   Significant Diagnostic Tests:  11/28 CT head > mucosal thickening in ethmoid air cells, study otherwise unremarkable  12/3 MRI Brain > normal MRI brain  12/3 EEG > mod to severe diffuse encephalopathy, non-specific etiology but could be due to toxic metabolic causes  Micro Data:  11/27 urine culture > multiple  species 11/28 blood >  11/28 SARS COV 2 > positive  Antimicrobials:  11/28 Vanc > 11/30, restart 12/2>  11/28 mero >  12/2 11/29 remdesivir >  12/2 ceftriaxone >   Interim history/subjective:  As above  Objective   Blood pressure (!) 161/149, pulse 92, temperature 98.2 F (36.8 C), temperature source Axillary, resp. rate (!) 23, height 5\' 2"  (1.575 m), weight 85 kg, SpO2 95 %.        Intake/Output Summary (Last 24 hours) at 03/15/2019 1619 Last data filed at 03/15/2019 1600 Gross per 24 hour  Intake 2110.56 ml  Output 850 ml  Net 1260.56 ml   Filed Weights   03/12/19 0710 03/12/19 1201  Weight: 85 kg 85 kg    Examination: General:  Resting in bed HENT: NCAT OP clear PULM: CTA B, normal effort CV: RRR, no mgr GI: BS+, soft, nontender MSK: normal bulk and tone Neuro: eyes open, doesn't track, doesn't follow commands, arms flexed at elbows, rigid, legs extended, rigid, + corneal reflex   Resolved Hospital Problem list     Assessment & Plan:  Severe Catatonic reaction of uncertain etiology, COVID related: Intubate for airway protection Benzodiazepine treatment per neuro/TRH Move to [facility] for further management Sedation: minimal: RASS goal 0 to -1, versed prn, fentanyl prn Antibiotics per neuro  Acute respiratory failure with hypoxemia: Intubate now Full mechanical vent support VAP prevention Daily WUA/SBT  COVID pneumonia: mild remdesivir 5 day course Decadron 10 day course   Best practice:  Diet: tube  feeding Pain/Anxiety/Delirium protocol (if indicated): as above VAP protocol (if indicated): yes DVT prophylaxis: lovenox per protocol GI prophylaxis: famotidine Glucose control: SSI Mobility: bed rest Code Status: full Family Communication: remain in ICU Disposition:   Labs   CBC: Recent Labs  Lab 03/09/19 1215  03/11/19 0449 03/12/19 0246 03/13/19 0100 03/14/19 0100 03/15/19 0047  WBC 6.4   < > 4.0 6.6 8.6 8.2 7.5  NEUTROABS 4.9  --    --  5.4 6.6 6.3 5.8  HGB 14.9   < > 14.2 15.2* 15.3* 14.9 14.9  HCT 44.3   < > 42.8 46.3* 47.3* 47.0* 47.1*  MCV 78.0*   < > 79.3* 80.2 81.6 82.9 82.8  PLT 223   < > 198 255 297 300 313   < > = values in this interval not displayed.    Basic Metabolic Panel: Recent Labs  Lab 03/11/19 0449 03/12/19 0246 03/13/19 0100 03/14/19 0100 03/15/19 0047  NA 137 139 142 143 141  K 3.2* 4.0 4.0 3.3* 3.6  CL 103 104 109 109 106  CO2 23 23 22 22 24   GLUCOSE 138* 119* 94 102* 102*  BUN 11 13 16 20 20   CREATININE 0.49 0.62 0.61 0.55 0.46  CALCIUM 8.0* 8.4* 8.3* 8.1* 8.2*   GFR: Estimated Creatinine Clearance: 78.5 mL/min (by C-G formula based on SCr of 0.46 mg/dL). Recent Labs  Lab 03/09/19 1517 03/09/19 1804 03/10/19 0527 03/11/19 0449 03/12/19 0246 03/13/19 0100 03/14/19 0100 03/15/19 0047  PROCALCITON  --   --  <0.10 <0.10  --   --   --   --   WBC  --   --  4.7 4.0 6.6 8.6 8.2 7.5  LATICACIDVEN 2.4* 1.3  --   --   --   --   --   --     Liver Function Tests: Recent Labs  Lab 03/11/19 0449 03/12/19 0246 03/13/19 0100 03/14/19 0100 03/15/19 0047  AST 56* 63* 62* 56* 39  ALT 29 37 41 44 42  ALKPHOS 61 64 64 60 59  BILITOT 1.1 1.0 1.2 1.1 1.0  PROT 7.0 7.2 6.8 6.5 6.6  ALBUMIN 3.2* 3.2* 3.1* 3.0* 3.0*   No results for input(s): LIPASE, AMYLASE in the last 168 hours. Recent Labs  Lab 03/09/19 1518  AMMONIA 10    ABG No results found for: PHART, PCO2ART, PO2ART, HCO3, TCO2, ACIDBASEDEF, O2SAT   Coagulation Profile: No results for input(s): INR, PROTIME in the last 168 hours.  Cardiac Enzymes: Recent Labs  Lab 03/13/19 0100  CKTOTAL 362*    HbA1C: Hemoglobin A1C  Date/Time Value Ref Range Status  02/03/2016 6.3  Final   Hgb A1c MFr Bld  Date/Time Value Ref Range Status  03/09/2019 01:24 PM 6.1 (H) 4.8 - 5.6 % Final    Comment:    (NOTE) Pre diabetes:          5.7%-6.4% Diabetes:              >6.4% Glycemic control for   <7.0% adults with diabetes    07/13/2018 10:23 AM 6.0 (H) <5.7 % of total Hgb Final    Comment:    For someone without known diabetes, a hemoglobin  A1c value between 5.7% and 6.4% is consistent with prediabetes and should be confirmed with a  follow-up test. . For someone with known diabetes, a value <7% indicates that their diabetes is well controlled. A1c targets should be individualized based on duration of diabetes, age, comorbid  conditions, and other considerations. . This assay result is consistent with an increased risk of diabetes. . Currently, no consensus exists regarding use of hemoglobin A1c for diagnosis of diabetes for children. .     CBG: Recent Labs  Lab 03/12/19 0750 03/14/19 1517 03/14/19 2131 03/15/19 0832 03/15/19 1128  GLUCAP 127* 136* 110* 118* 93    Review of Systems:   Cannot obtain   Past Medical History  She,  has a past medical history of GERD (gastroesophageal reflux disease), Hypertension, and Thyroid disease.   Surgical History    Past Surgical History:  Procedure Laterality Date  . DENTAL SURGERY       Social History   reports that she has never smoked. She has never used smokeless tobacco. She reports that she does not drink alcohol or use drugs.   Family History   Her family history includes Heart disease in her mother. There is no history of Breast cancer.   Allergies No Known Allergies   Home Medications  Prior to Admission medications   Medication Sig Start Date End Date Taking? Authorizing Provider  ascorbic acid (VITAMIN C) 500 MG/5ML syrup Take 5 mLs (500 mg total) by mouth daily. 03/11/19   Max Sane, MD  atorvastatin (LIPITOR) 20 MG tablet TAKE 1 TABLET BY MOUTH AT BEDTIME FOR CHOLESTEROL Patient taking differently: Take 20 mg by mouth at bedtime.  11/01/18   Steele Sizer, MD  dexamethasone (DECADRON) 10 MG/ML injection Inject 0.6 mLs (6 mg total) into the vein daily. 03/11/19   Max Sane, MD  gabapentin (NEURONTIN) 300 MG capsule  Take 1 capsule (300 mg total) by mouth at bedtime. 02/05/19   Steele Sizer, MD  levothyroxine (SYNTHROID) 100 MCG tablet Take 1 tablet (100 mcg total) by mouth daily before breakfast. 02/05/19   Ancil Boozer, [name], MD  LORazepam (ATIVAN) 0.5 MG tablet Take 0.5 mg by mouth daily as needed for anxiety. Can take an extra pill 30 minutes after the first dose if needed    Agapito Games, MD  meropenem 1 g in sodium chloride 0.9 % 100 mL Inject 1 g into the vein every 8 (eight) hours. 03/11/19   Max Sane, MD  mupirocin ointment (BACTROBAN) 2 % Apply 1 application topically 2 (two) times daily as needed (for itchy bumps).  12/20/16   [provider]  PARoxetine (PAXIL) 30 MG tablet TAKE 1 TABLET BY MOUTH AT BEDTIME FOR DEPRESSION Patient taking differently: Take 30 mg by mouth at bedtime.  10/03/18   Steele Sizer, MD  saccharomyces boulardii (FLORASTOR) 250 MG capsule Take 250 mg by mouth daily.    [provider]  triamcinolone cream (KENALOG) 0.1 % Apply 1 application topically 2 (two) times daily. For two weeks and stop, if no improvement return for follow up Patient taking differently: Apply 1 application topically 2 (two) times daily as needed (for itchy bumps).  03/13/17   Steele Sizer, MD  triamterene-hydrochlorothiazide ([id number]) 37.5-25 MG tablet TAKE 1/2 TABLET BY MOUTH EVERY DAY Patient taking differently: Take 0.5 tablets by mouth daily.  11/01/18   Steele Sizer, MD  vancomycin 1,750 mg in sodium chloride 0.9 % 500 mL Inject 1,750 mg into the vein daily. 03/11/19   Max Sane, MD  VIACTIV CALCIUM PLUS D 650-12.5-40 MG-MCG CHEW CHEW AND SWALLOW ONE PIECE TWICE DAILY. (SUPPLEMENT) CARAMEL Patient taking differently: Chew 1 Dose by mouth 2 (two) times daily.  11/01/18   Steele Sizer, MD  zinc sulfate 220 (50 Zn) MG capsule Take 1  capsule (220 mg total) by mouth daily. 03/12/19   Max Sane, MD     Critical care time: 40 minutes     [name] Awkward, MD [place]  PCCM Pager: [phone] Cell: [phone] If no response, call [phone]

## 2019-03-15 NOTE — Progress Notes (Signed)
Speech Language Pathology Treatment: Dysphagia  Patient Details Name: Rebecca Bruce MRN: [id number] DOB: April 18, 1960 Today's Date: 03/15/2019 Time: 4098-1191 SLP Time Calculation (min) (ACUTE ONLY): 8 min  Assessment / Plan / Recommendation Clinical Impression  Despite maximal effort to achieve attention to PO, pt remains completely unresponsive today. No oral manipulation or swallow elicited. Oral suction applied to clear standing secretions and PO. Will continue efforts   HPI HPI: Patient is a 58 y.o. female with PMHx of hypothyroidism, HTN, intellectual disability-lives in a group home for the past 20 years (normally fairly independent and talks)-brought in to [facility] on 11/28 for altered mental status, poor oral intake-further evaluation revealed COVID-19 pneumonia.  Has been encephalopathic, alert but resists caregivers, does not interact      SLP Plan  Continue with current plan of care       Recommendations  Diet recommendations: Thin liquid Liquids provided via: Cup Medication Administration: Via alternative means Supervision: Full supervision/cueing for compensatory strategies;Staff to assist with self feeding Compensations: Slow rate;Small sips/bites Postural Changes and/or Swallow Maneuvers: Seated upright 90 degrees                Oral Care Recommendations: Oral care QID Follow up Recommendations: Skilled Nursing facility SLP Visit Diagnosis: Dysphagia, unspecified (R13.10) Plan: Continue with current plan of care       GO                Rebecca Bruce, Riley Nearing 03/15/2019, 2:36 PM

## 2019-03-16 DIAGNOSIS — J1289 Other viral pneumonia: Secondary | ICD-10-CM

## 2019-03-16 LAB — CBC WITH DIFFERENTIAL/PLATELET
Abs Immature Granulocytes: 0.07 10*3/uL (ref 0.00–0.07)
Basophils Absolute: 0 10*3/uL (ref 0.0–0.1)
Basophils Relative: 0 %
Eosinophils Absolute: 0 10*3/uL (ref 0.0–0.5)
Eosinophils Relative: 0 %
HCT: 43.5 % (ref 36.0–46.0)
Hemoglobin: 13.6 g/dL (ref 12.0–15.0)
Immature Granulocytes: 1 %
Lymphocytes Relative: 8 %
Lymphs Abs: 0.9 10*3/uL (ref 0.7–4.0)
MCH: 26.1 pg (ref 26.0–34.0)
MCHC: 31.3 g/dL (ref 30.0–36.0)
MCV: 83.3 fL (ref 80.0–100.0)
Monocytes Absolute: 0.7 10*3/uL (ref 0.1–1.0)
Monocytes Relative: 7 %
Neutro Abs: 8.9 10*3/uL — ABNORMAL HIGH (ref 1.7–7.7)
Neutrophils Relative %: 84 %
Platelets: 322 10*3/uL (ref 150–400)
RBC: 5.22 MIL/uL — ABNORMAL HIGH (ref 3.87–5.11)
RDW: 14.4 % (ref 11.5–15.5)
WBC: 10.5 10*3/uL (ref 4.0–10.5)
nRBC: 0 % (ref 0.0–0.2)

## 2019-03-16 LAB — GLUCOSE, CAPILLARY
Glucose-Capillary: 112 mg/dL — ABNORMAL HIGH (ref 70–99)
Glucose-Capillary: 116 mg/dL — ABNORMAL HIGH (ref 70–99)
Glucose-Capillary: 126 mg/dL — ABNORMAL HIGH (ref 70–99)
Glucose-Capillary: 137 mg/dL — ABNORMAL HIGH (ref 70–99)
Glucose-Capillary: 142 mg/dL — ABNORMAL HIGH (ref 70–99)
Glucose-Capillary: 169 mg/dL — ABNORMAL HIGH (ref 70–99)

## 2019-03-16 LAB — COMPREHENSIVE METABOLIC PANEL
ALT: 34 U/L (ref 0–44)
AST: 32 U/L (ref 15–41)
Albumin: 2.5 g/dL — ABNORMAL LOW (ref 3.5–5.0)
Alkaline Phosphatase: 53 U/L (ref 38–126)
Anion gap: 8 (ref 5–15)
BUN: 21 mg/dL — ABNORMAL HIGH (ref 6–20)
CO2: 26 mmol/L (ref 22–32)
Calcium: 8 mg/dL — ABNORMAL LOW (ref 8.9–10.3)
Chloride: 107 mmol/L (ref 98–111)
Creatinine, Ser: 0.59 mg/dL (ref 0.44–1.00)
GFR calc Af Amer: >60 mL/min (ref >60)
GFR calc non Af Amer: >60 mL/min (ref >60)
Glucose, Bld: 126 mg/dL — ABNORMAL HIGH (ref 70–99)
Potassium: 3.3 mmol/L — ABNORMAL LOW (ref 3.5–5.1)
Sodium: 141 mmol/L (ref 135–145)
Total Bilirubin: 0.6 mg/dL (ref 0.3–1.2)
Total Protein: 5.7 g/dL — ABNORMAL LOW (ref 6.5–8.1)

## 2019-03-16 LAB — MRSA PCR SCREENING: MRSA by PCR: NEGATIVE

## 2019-03-16 LAB — MAGNESIUM: Magnesium: 2.1 mg/dL (ref 1.7–2.4)

## 2019-03-16 LAB — D-DIMER, QUANTITATIVE: D-Dimer, Quant: 2.67 ug/mL-FEU — ABNORMAL HIGH (ref 0.00–0.50)

## 2019-03-16 LAB — C-REACTIVE PROTEIN: CRP: 1.7 mg/dL — ABNORMAL HIGH (ref <1.0)

## 2019-03-16 LAB — FERRITIN: Ferritin: 830 ng/mL — ABNORMAL HIGH (ref 11–307)

## 2019-03-16 LAB — PHOSPHORUS: Phosphorus: 2.3 mg/dL — ABNORMAL LOW (ref 2.5–4.6)

## 2019-03-16 MED ORDER — MIDAZOLAM HCL 2 MG/2ML IJ SOLN: 2.0000 mg | INTRAMUSCULAR | Status: DC | PRN

## 2019-03-16 MED ORDER — POTASSIUM CHLORIDE 20 MEQ/15ML (10%) PO SOLN
40.0000 meq | Freq: Once | ORAL | Status: AC
  Administered 2019-03-16: 10:00:00 40 meq via ORAL
  Filled 2019-03-16: qty 30

## 2019-03-16 MED ORDER — INSULIN ASPART 100 UNIT/ML ~~LOC~~ SOLN
0.0000 [IU] | SUBCUTANEOUS | Status: DC
  Administered 2019-03-16: 3 [IU] via SUBCUTANEOUS
  Administered 2019-03-16 – 2019-03-17 (×3): 2 [IU] via SUBCUTANEOUS
  Administered 2019-03-17 (×4): 3 [IU] via SUBCUTANEOUS
  Administered 2019-03-18 – 2019-03-20 (×9): 2 [IU] via SUBCUTANEOUS
  Administered 2019-03-20: 3 [IU] via SUBCUTANEOUS
  Administered 2019-03-21 (×2): 2 [IU] via SUBCUTANEOUS
  Administered 2019-03-21: 3 [IU] via SUBCUTANEOUS
  Administered 2019-03-22 – 2019-03-24 (×10): 2 [IU] via SUBCUTANEOUS
  Administered 2019-03-24: 0 [IU] via SUBCUTANEOUS
  Administered 2019-03-25 (×3): 2 [IU] via SUBCUTANEOUS
  Administered 2019-03-25: 3 [IU] via SUBCUTANEOUS
  Administered 2019-03-26: 2 [IU] via SUBCUTANEOUS
  Administered 2019-03-26 – 2019-03-27 (×5): 3 [IU] via SUBCUTANEOUS
  Administered 2019-03-27: 2 [IU] via SUBCUTANEOUS
  Administered 2019-03-28 (×2): 3 [IU] via SUBCUTANEOUS
  Administered 2019-03-28: 5 [IU] via SUBCUTANEOUS
  Administered 2019-03-28: 3 [IU] via SUBCUTANEOUS
  Administered 2019-03-28: 5 [IU] via SUBCUTANEOUS
  Administered 2019-03-29: 2 [IU] via SUBCUTANEOUS
  Administered 2019-03-29: 5 [IU] via SUBCUTANEOUS
  Administered 2019-03-29 – 2019-04-01 (×5): 2 [IU] via SUBCUTANEOUS
  Administered 2019-04-01: 3 [IU] via SUBCUTANEOUS
  Administered 2019-04-01 – 2019-04-02 (×2): 2 [IU] via SUBCUTANEOUS
  Administered 2019-04-02: 3 [IU] via SUBCUTANEOUS
  Administered 2019-04-02 – 2019-04-03 (×3): 2 [IU] via SUBCUTANEOUS
  Administered 2019-04-03: 1 [IU] via SUBCUTANEOUS
  Administered 2019-04-03 – 2019-04-04 (×6): 2 [IU] via SUBCUTANEOUS
  Administered 2019-04-05 (×2): 3 [IU] via SUBCUTANEOUS
  Administered 2019-04-06 (×4): 2 [IU] via SUBCUTANEOUS
  Administered 2019-04-07: 3 [IU] via SUBCUTANEOUS
  Administered 2019-04-07: 2 [IU] via SUBCUTANEOUS
  Administered 2019-04-07: 3 [IU] via SUBCUTANEOUS
  Administered 2019-04-09 – 2019-04-12 (×6): 2 [IU] via SUBCUTANEOUS
  Administered 2019-04-12: 10:00:00 3 [IU] via SUBCUTANEOUS

## 2019-03-16 MED ORDER — ENOXAPARIN SODIUM 40 MG/0.4ML ~~LOC~~ SOLN
40.0000 mg | Freq: Two times a day (BID) | SUBCUTANEOUS | Status: DC
  Administered 2019-03-16 – 2019-03-24 (×14): 40 mg via SUBCUTANEOUS
  Filled 2019-03-16 (×15): qty 0.4

## 2019-03-16 MED ORDER — FENTANYL 2500MCG IN NS 250ML (10MCG/ML) PREMIX INFUSION
50.0000 ug/h | INTRAVENOUS | Status: DC
  Administered 2019-03-16 – 2019-03-19 (×2): 50 ug/h via INTRAVENOUS
  Administered 2019-03-22: 02:00:00 75 ug/h via INTRAVENOUS
  Filled 2019-03-16 (×3): qty 250

## 2019-03-16 MED ORDER — ACETAMINOPHEN 325 MG PO TABS
650.0000 mg | ORAL_TABLET | Freq: Four times a day (QID) | ORAL | Status: DC | PRN
  Filled 2019-03-16: qty 2

## 2019-03-16 MED ORDER — VITAL AF 1.2 CAL PO LIQD
1000.0000 mL | ORAL | Status: DC
  Administered 2019-03-16 – 2019-03-23 (×8): 1000 mL
  Administered 2019-03-24: 600 mL
  Administered 2019-03-25: 1000 mL

## 2019-03-16 MED ORDER — FAMOTIDINE 40 MG/5ML PO SUSR
20.0000 mg | Freq: Two times a day (BID) | ORAL | Status: DC
  Administered 2019-03-16 – 2019-03-27 (×23): 20 mg
  Filled 2019-03-16 (×23): qty 2.5

## 2019-03-16 MED ORDER — PROPOFOL 1000 MG/100ML IV EMUL
0.0000 ug/kg/min | INTRAVENOUS | Status: DC
  Administered 2019-03-16: 5 ug/kg/min via INTRAVENOUS
  Administered 2019-03-17: 10 ug/kg/min via INTRAVENOUS
  Filled 2019-03-16 (×2): qty 100

## 2019-03-16 MED ORDER — IPRATROPIUM-ALBUTEROL 0.5-2.5 (3) MG/3ML IN SOLN
3.0000 mL | Freq: Four times a day (QID) | RESPIRATORY_TRACT | Status: DC
  Administered 2019-03-16 – 2019-03-17 (×4): 3 mL via RESPIRATORY_TRACT
  Filled 2019-03-16 (×4): qty 3

## 2019-03-16 MED ORDER — FENTANYL BOLUS VIA INFUSION
50.0000 ug | INTRAVENOUS | Status: DC | PRN
  Administered 2019-03-19: 20:00:00 50 ug via INTRAVENOUS
  Filled 2019-03-16: qty 50

## 2019-03-16 NOTE — Progress Notes (Signed)
Patient taken off vent for [facility] to transfer her to [facility] on their portable vent.  Report given to 64M RT as [facility] was leaving [facility].

## 2019-03-16 NOTE — Progress Notes (Signed)
NAME:  Rebecca Bruce, MRN:  [id number], DOB:  [birth year], LOS: 5 ADMISSION DATE:  03/11/2019, CONSULTATION DATE:  12/4 REFERRING MD:  Sloan Leiter, CHIEF COMPLAINT:  Inability to protect airway   Brief History   58 y/o female admitted on 11/30 on transfer from [facility] in setting of acute encephalopathy, noted to be COVID positive.  Has developed catatonia and ultimately required intubation for inability to protect her airway on 12/4.    History of present illness / CONSULTATION NOTE  58 y/o female brought to [facility] on 11/28 in the setting of altered mental status from her group home.  She was found to have COVID pneumonia, treated with decadron and remdesivir then sent to [facility] on 11/30.  Here has had progressive increase in acute encephalopathy and has become less and less responsive.  Today PCCM was asked to see her because she reached the point that she was aspirating and not responding to pharyngeal/tracheal suctioning.  By report her exam findings have waxed and waned over the last three days but have not generally moved in the right direction.  Neurology saw her on 12/2 and apparently at that time she followed some simple commands, but she was non-verbal at the time with significant rigidity noted in her upper extremities.   At baseline lives in a group home, can bathe herself and have a conversation.  Has baseline anxiety and depression.    Past Medical History  Thyroid disease Hypertension GERD  Significant Hospital Events   11/28 admission [facility] 11/30 transfer to [facility] 12/04 intubation for inability to protect airway 12/05 add diprivan, fentanyl gtt  Consults:  Neurology  Procedures:  12/4 ETT >   Significant Diagnostic Tests:  11/28 CT head > mucosal thickening in ethmoid air cells, study otherwise unremarkable  12/03 MRI Brain > normal MRI brain  12/03 EEG > mod to severe diffuse encephalopathy, non-specific etiology but could be due to toxic metabolic causes  Micro Data:   11/27 urine culture > multiple species 11/28 blood > negative 11/28 SARS COV 2 > positive 12/04 sputum >   Antimicrobials:  11/28 Vanc > 11/30, restart 12/2>  11/28 mero >  12/2 11/29 remdesivir > 12/3 12/02 ceftriaxone >  12/02 vancomycin >  12/04 flagyl >   Interim history/subjective:  Suctioned large plug by RT >> Peak pressure improved after this.  No respiratory effort with SBT.  Objective   Blood pressure 111/62, pulse 66, temperature 98.6 F (37 C), temperature source Oral, resp. rate (!) 24, height 5\' 2"  (1.575 m), weight 88.6 kg, SpO2 100 %.    Vent Mode: PRVC FiO2 (%):  [40 %-100 %] 40 % Set Rate:  [24 bmp] 24 bmp Vt Set:  [400 mL] 400 mL PEEP:  [5 cmH20] 5 cmH20 Plateau Pressure:  [20 cmH20-27 cmH20] 27 cmH20   Intake/Output Summary (Last 24 hours) at 03/16/2019 0956 Last data filed at 03/16/2019 0800 Gross per 24 hour  Intake 3079.84 ml  Output 1250 ml  Net 1829.84 ml   Filed Weights   03/12/19 0710 03/12/19 1201 03/16/19 0500  Weight: 85 kg 85 kg 88.6 kg    Examination:  General - sedated Eyes - pupils reactive ENT - ETT in place Cardiac - regular rate/rhythm, no murmur Chest - b/l crackles and wheeze Abdomen - soft, non tender, + bowel sounds Extremities - no cyanosis, clubbing, or edema Skin - no rashes Neuro - rigid muscle tone  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 - completed remdesivir - day 5/10 decadron - full vent support - goal SpO2 88 to 95% - f/u CXR - scheduled BDs - continue zinc, vit C  Acute metabolic encephalopathy. Severe Catatonic reaction of uncertain etiology. Hx of intellectual disability. - neurology following - MRI, EEG unrevealing for cause - add diprivan, fentanyl gtt for sedation and decrease muscle tone - prn versed - arrange for transfer to [facility] for further neuro monitoring - f/u CPK - hold outpt  paxil - klonopin 2 mg tid - day 3 of ABx  Hypothyroidism. - continue synthroid  Hx of HTN, HLD. - goal SBP < 160 - continue lipitor  Steroid induced hyperglycemia. - SSI   Best practice:  Diet: tube feeding DVT prophylaxis: lovenox per protocol GI prophylaxis: famotidine Mobility: bed rest Code Status: full Disposition: ICU   Labs    CMP Latest Ref Rng & Units 03/16/2019 03/15/2019 03/15/2019  Glucose 70 - 99 mg/dL 126(H) - 102(H)  BUN 6 - 20 mg/dL 21(H) - 20  Creatinine 0.44 - 1.00 mg/dL 0.59 - 0.46  Sodium 135 - 145 mmol/L 141 141 141  Potassium 3.5 - 5.1 mmol/L 3.3(L) 3.1(L) 3.6  Chloride 98 - 111 mmol/L 107 - 106  CO2 22 - 32 mmol/L 26 - 24  Calcium 8.9 - 10.3 mg/dL 8.0(L) - 8.2(L)  Total Protein 6.5 - 8.1 g/dL 5.7(L) - 6.6  Total Bilirubin 0.3 - 1.2 mg/dL 0.6 - 1.0  Alkaline Phos 38 - 126 U/L 53 - 59  AST 15 - 41 U/L 32 - 39  ALT 0 - 44 U/L 34 - 42    CBC Latest Ref Rng & Units 03/16/2019 03/15/2019 03/15/2019  WBC 4.0 - 10.5 K/uL 10.5 - 7.5  Hemoglobin 12.0 - 15.0 g/dL 13.6 13.9 14.9  Hematocrit 36.0 - 46.0 % 43.5 41.0 47.1(H)  Platelets 150 - 400 K/uL 322 - 313    ABG    Component Value Date/Time   PHART 7.390 03/15/2019 1727   PCO2ART 42.5 03/15/2019 1727   PO2ART 247.0 (H) 03/15/2019 1727   HCO3 25.7 03/15/2019 1727   TCO2 27 03/15/2019 1727   O2SAT 100.0 03/15/2019 1727    CBG (last 3)  Recent Labs    03/16/19 0040 03/16/19 0507 03/16/19 0812  GLUCAP 116* 142* 112*    CC time 32 minutes  Chesley Mires, MD [place] 03/16/2019, 10:08 AM

## 2019-03-16 NOTE — Progress Notes (Signed)
Pharmacy Antibiotic Note  Evangelyne Lacewell is a 58 y.o. female admitted on 03/11/2019 with COVID-19.  Pharmacy has been consulted for Vancomycin dosing for possible meningitis.  Per TRH/Neuro, there is some concern for meningitis, but low probability.  Treat for a total of 10 days as if meningitis, since it will be very difficult to do a spinal tap.  Day #8/10 antibiotics for meningitis.  SCr remains stable with plans to complete therapy in 2 more day.  Will hold off on ordering vanc levels.  Plan: Ceftriaxone 2g IV q12h Vancomycin 2g IV x1 followed by 1g IV q12h. Goal AUC > 500 and Cmin 15-20 mcg/mL for meningitis. Follow up renal function, culture results, and clinical course.  Height: 5\' 2"  (157.5 cm) Weight: 195 lb 5.2 oz (88.6 kg) IBW/kg (Calculated) : 50.1  Temp (24hrs), Avg:98.3 F (36.8 C), Min:97.8 F (36.6 C), Max:98.6 F (37 C)  Recent Labs  Lab 03/09/19 1517 03/09/19 1804  03/12/19 0246 03/13/19 0100 03/14/19 0100 03/15/19 0047 03/16/19 0440  WBC  --   --    < > 6.6 8.6 8.2 7.5 10.5  CREATININE  --   --    < > 0.62 0.61 0.55 0.46 0.59  LATICACIDVEN 2.4* 1.3  --   --   --   --   --   --    < > = values in this interval not displayed.    Estimated Creatinine Clearance: 80.2 mL/min (by C-G formula based on SCr of 0.59 mg/dL).    No Known Allergies  Antimicrobials this admission: 11/28 Ceftriaxone x1 11/28 Vanc >> 11/30 11/28 Meropenem >> 12/2 11/29 remdesivir >>12/4 12/2 Ceftriaxone >> (11/7) 12/2 Vancomycin >> (11/7) 12/4 Metronidazole >>   Dose adjustments this admission:   Microbiology results: 11/27 UCx: multiple species 11/28 COVID + 11/28 BCx: ngtd 12/5 Trach asp:  Collected 12/5 MRSA PCR: neg  Thank you for allowing pharmacy to be a part of this patient's care.  Gretta Arab PharmD, BCPS Clinical pharmacist phone 7am- 5pm: [phone] 03/16/2019 9:36 AM

## 2019-03-16 NOTE — Progress Notes (Addendum)
RN attempted x2 to call report to Rebecca Bruce, [place] informed RN to wait for call back. Transport currently here to take patient to [facility]. VSS, patient resting comfortably in bed, on no sedation at this time.  [place]ke called report to Maudie Mercury at [facility].

## 2019-03-16 NOTE — Progress Notes (Signed)
Reason for consult: Encephalopathy  Subjective:    ROS: negative except above  Examination  Vital signs in last 24 hours: Temp:  [97.8 F (36.6 C)-98.6 F (37 C)] 98.6 F (37 C) (12/05 0800) Pulse Rate:  [58-97] 66 (12/05 0858) Resp:  [0-26] 24 (12/05 0858) BP: (67-207)/(56-149) 111/62 (12/05 0858) SpO2:  [93 %-100 %] 100 % (12/05 0858) FiO2 (%):  [40 %-100 %] 40 % (12/05 0858) Weight:  [88.6 kg] 88.6 kg (12/05 0500)  General: lying in bed, intuabted CVS: pulse-normal rate and rhythm RS: breathing with assistance of vent Extremities: normal   Neuro: intubated not on continuous sedation Mental Status: Patient does not respond to verbal stimuli.  Does not respond to deep sternal rub.   Cranial Nerves: II: Pupils 2- 3 mm bilaterally and sluggish III,IV,VI: Doll's sluggish, disconjugate gaze  V,VII: corneal reflex: present VIII: patient does not respond to verbal stimuli IX,X: gag reflex present  XI: trapezius strength unable to test bilaterally XII: tongue strength unable to test Motor: Does not withdraw in all 4 extremities, ? Mild extensor posturing. Significantly increased tone, lead pipe rigidity in all 4 extremities Sensory: Does not respond to noxious stimuli in any extremity. Deep Tendon Reflexes: not hyperreflexic Plantars: extensor bilaterally Cerebellar: Unable to perform  Basic Metabolic Panel: Recent Labs  Lab 03/12/19 0246 03/13/19 0100 03/14/19 0100 03/15/19 0047 03/15/19 1727 03/15/19 1850 03/16/19 0440  NA 139 142 143 141 141  --  141  K 4.0 4.0 3.3* 3.6 3.1*  --  3.3*  CL 104 109 109 106  --   --  107  CO2 23 22 22 24   --   --  26  GLUCOSE 119* 94 102* 102*  --   --  126*  BUN 13 16 20 20   --   --  21*  CREATININE 0.62 0.61 0.55 0.46  --   --  0.59  CALCIUM 8.4* 8.3* 8.1* 8.2*  --   --  8.0*  MG  --   --   --   --   --  2.1 2.1  PHOS  --   --   --   --   --  2.8 2.3*    CBC: Recent Labs  Lab 03/12/19 0246 03/13/19 0100 03/14/19 0100  03/15/19 0047 03/15/19 1727 03/16/19 0440  WBC 6.6 8.6 8.2 7.5  --  10.5  NEUTROABS 5.4 6.6 6.3 5.8  --  8.9*  HGB 15.2* 15.3* 14.9 14.9 13.9 13.6  HCT 46.3* 47.3* 47.0* 47.1* 41.0 43.5  MCV 80.2 81.6 82.9 82.8  --  83.3  PLT 255 297 300 313  --  322     Coagulation Studies: No results for input(s): LABPROT, INR in the last 72 hours.  Imaging Reviewed:     ASSESSMENT AND PLAN  58 y.o. female with PMHx of hypothyroidism, HTN, intellectual disability-lives in a group home for the past 20 years developed catatonia while admitted with COVID-19 infection and intubated for airway protection. Patient has significantly increased tone. MRI brain on 12/3 unremarkable. Patient has remained afebrile. EEG on 12/3 suggestive of triphasic waves, no epileptiform discharges seen. Patient is on paxil which has been discontinued. Klonapin was ordered howver not admistered as patient was not taking PO, no NG tube placed.  Impression: AMS with significantly increased muscle tone being on Paxil raises suspicion for Serotonin Syndrome, however no clonus, hypertheria on exam which would classical.  Other consideration include meningitis, NCSE.   Acute Metabolic Encephalopathy COVID -  19 infection   Recommendations Agree with treating for empiric meningitis  Repeat CK Continue Klonopin 2mg  TID  Would start sedation with propofol/versed to see if this can reduce tone.  Pending transfer to [facility] for consideration of LTM EEG.   CRITICAL CARE Performed by: Lanice Schwab Nil Xiong   Total critical care time: 40  minutes  Critical care time was exclusive of separately billable procedures and treating other patients.  Critical care was necessary to treat or prevent imminent or life-threatening deterioration.  Critical care was time spent personally by me on the following activities: development of treatment plan with patient and/or surrogate as well as nursing, discussions with consultants, evaluation of  patient's response to treatment, examination of patient, obtaining history from patient or surrogate, ordering and performing treatments and interventions, ordering and review of laboratory studies, ordering and review of radiographic studies, pulse oximetry and re-evaluation of patient's condition.   Karena Addison Margrit Minner [facility] Pager Number [phone] For questions after 7pm please refer to AMION to reach the Neurologist on call

## 2019-03-16 NOTE — Progress Notes (Signed)
Patient not getting full return volumes.  RT lavaged ETT and obtained several large mucus plugs.  Plugs were sent to lab for a culture. Patient's return volumes improved.

## 2019-03-16 NOTE — Progress Notes (Addendum)
Nutrition Follow up  DOCUMENTATION CODES:   Not applicable  INTERVENTION:   Tube feeding:  -Vital AF 1.2 @ 50 ml/hr via NGT  -30 ml Prostat BID  Provides: 1640 kcals, 120 grams protein, 973 ml free water. Meets 105% kcal needs and 100% of protein needs.   NUTRITION DIAGNOSIS:   Increased nutrient needs related to acute illness(COVID PNA) as evidenced by estimated needs.  Ongoing  GOAL:   Patient will meet greater than or equal to 90% of their needs   Addressed via TF  MONITOR:   Vent status, I & O's  REASON FOR ASSESSMENT:   (Cortrak request)    ASSESSMENT:   Pt with PMH of hypothyroidism, HTN, intellectual disability who lives in a group home. Pt admitted with AMS, poor oral intake, and COVID-19 PNA.   RD working remotely.  Transferred to [facility] for further neuro assessment. Propofol to be added? Not started per [facility]. Plug suctioned by RT this am. Tolerating Vital High Protein @ 40 ml/hr. RD to change formula to better meet needs. Remains on Cortrak list- service offered Tuesday 8am-4pm if needed. Utilize NGT.   Admission weight: 85 kg  Current weight: 88.6 kg   Patient remains intubated on ventilator support MV: 9.4 L/min Temp (24hrs), Avg:98.3 F (36.8 C), Min:98 F (36.7 C), Max:98.6 F (37 C)   I/O: +5,996 ml since admit UOP: 900 ml x 24 hrs     Drips: NS @ 50 ml/hr, abx Medications: decadron, colace, SS novolog, lactobacillus, 500 mg Vit C daily, 220 mg zinc sulfate daily  Labs: K 3.3 (L) Phosphorus 2.3 (L)   Diet Order:   Diet Order            Diet NPO time specified  Diet effective now              EDUCATION NEEDS:   No education needs have been identified at this time  Skin:  Skin Assessment: Skin Integrity Issues: Skin Integrity Issues:: Other (Comment) Other: MASD- perineum, groin  Last BM:  12/4 small  Height:   Ht Readings from Last 1 Encounters:  03/12/19 5\' 2"  (1.575 m)    Weight:   Wt Readings from Last 1  Encounters:  03/16/19 88.6 kg    Ideal Body Weight:  50 kg  BMI:  Body mass index is 35.73 kg/m.  Estimated Nutritional Needs:   Kcal:  1558 kcal  Protein:  110-130 grams  Fluid:  >/= 1.6 L.day  Mariana Single RD, LDN Clinical Nutrition Pager # [phone]

## 2019-03-17 ENCOUNTER — Inpatient Hospital Stay (HOSPITAL_COMMUNITY): Payer: Medicare Other

## 2019-03-17 DIAGNOSIS — G9341 Metabolic encephalopathy: Secondary | ICD-10-CM

## 2019-03-17 DIAGNOSIS — J9601 Acute respiratory failure with hypoxia: Secondary | ICD-10-CM

## 2019-03-17 LAB — GLUCOSE, CAPILLARY
Glucose-Capillary: 111 mg/dL — ABNORMAL HIGH (ref 70–99)
Glucose-Capillary: 130 mg/dL — ABNORMAL HIGH (ref 70–99)
Glucose-Capillary: 131 mg/dL — ABNORMAL HIGH (ref 70–99)
Glucose-Capillary: 153 mg/dL — ABNORMAL HIGH (ref 70–99)
Glucose-Capillary: 165 mg/dL — ABNORMAL HIGH (ref 70–99)
Glucose-Capillary: 166 mg/dL — ABNORMAL HIGH (ref 70–99)
Glucose-Capillary: 172 mg/dL — ABNORMAL HIGH (ref 70–99)

## 2019-03-17 LAB — TRIGLYCERIDES
Triglycerides: 51 mg/dL (ref <150)
Triglycerides: 42 mg/dL (ref <150)

## 2019-03-17 LAB — BASIC METABOLIC PANEL
Anion gap: 9 (ref 5–15)
BUN: 16 mg/dL (ref 6–20)
CO2: 21 mmol/L — ABNORMAL LOW (ref 22–32)
Calcium: 7.9 mg/dL — ABNORMAL LOW (ref 8.9–10.3)
Chloride: 111 mmol/L (ref 98–111)
Creatinine, Ser: 0.39 mg/dL — ABNORMAL LOW (ref 0.44–1.00)
GFR calc Af Amer: >60 mL/min (ref >60)
GFR calc non Af Amer: >60 mL/min (ref >60)
Glucose, Bld: 129 mg/dL — ABNORMAL HIGH (ref 70–99)
Potassium: 4 mmol/L (ref 3.5–5.1)
Sodium: 141 mmol/L (ref 135–145)

## 2019-03-17 LAB — HEPATIC FUNCTION PANEL
ALT: 48 U/L — ABNORMAL HIGH (ref 0–44)
AST: 51 U/L — ABNORMAL HIGH (ref 15–41)
Albumin: 2.3 g/dL — ABNORMAL LOW (ref 3.5–5.0)
Alkaline Phosphatase: 51 U/L (ref 38–126)
Bilirubin, Direct: 0.4 mg/dL — ABNORMAL HIGH (ref 0.0–0.2)
Indirect Bilirubin: 0.1 mg/dL — ABNORMAL LOW (ref 0.3–0.9)
Total Bilirubin: 0.5 mg/dL (ref 0.3–1.2)
Total Protein: 5.3 g/dL — ABNORMAL LOW (ref 6.5–8.1)

## 2019-03-17 LAB — CBC
HCT: 41.5 % (ref 36.0–46.0)
Hemoglobin: 13.1 g/dL (ref 12.0–15.0)
MCH: 26.4 pg (ref 26.0–34.0)
MCHC: 31.6 g/dL (ref 30.0–36.0)
MCV: 83.7 fL (ref 80.0–100.0)
Platelets: 250 10*3/uL (ref 150–400)
RBC: 4.96 MIL/uL (ref 3.87–5.11)
RDW: 14.6 % (ref 11.5–15.5)
WBC: 10.2 10*3/uL (ref 4.0–10.5)
nRBC: 0 % (ref 0.0–0.2)

## 2019-03-17 LAB — MAGNESIUM: Magnesium: 2.2 mg/dL (ref 1.7–2.4)

## 2019-03-17 LAB — PHOSPHORUS: Phosphorus: 2.6 mg/dL (ref 2.5–4.6)

## 2019-03-17 IMAGING — DX DG CHEST 1V PORT
1 series · 1 of 1 positions shown · non-contrast
Comparison: [DATE]

CLINICAL DATA: Respiratory failure.

EXAM:
PORTABLE CHEST 1 VIEW

[chest ap]
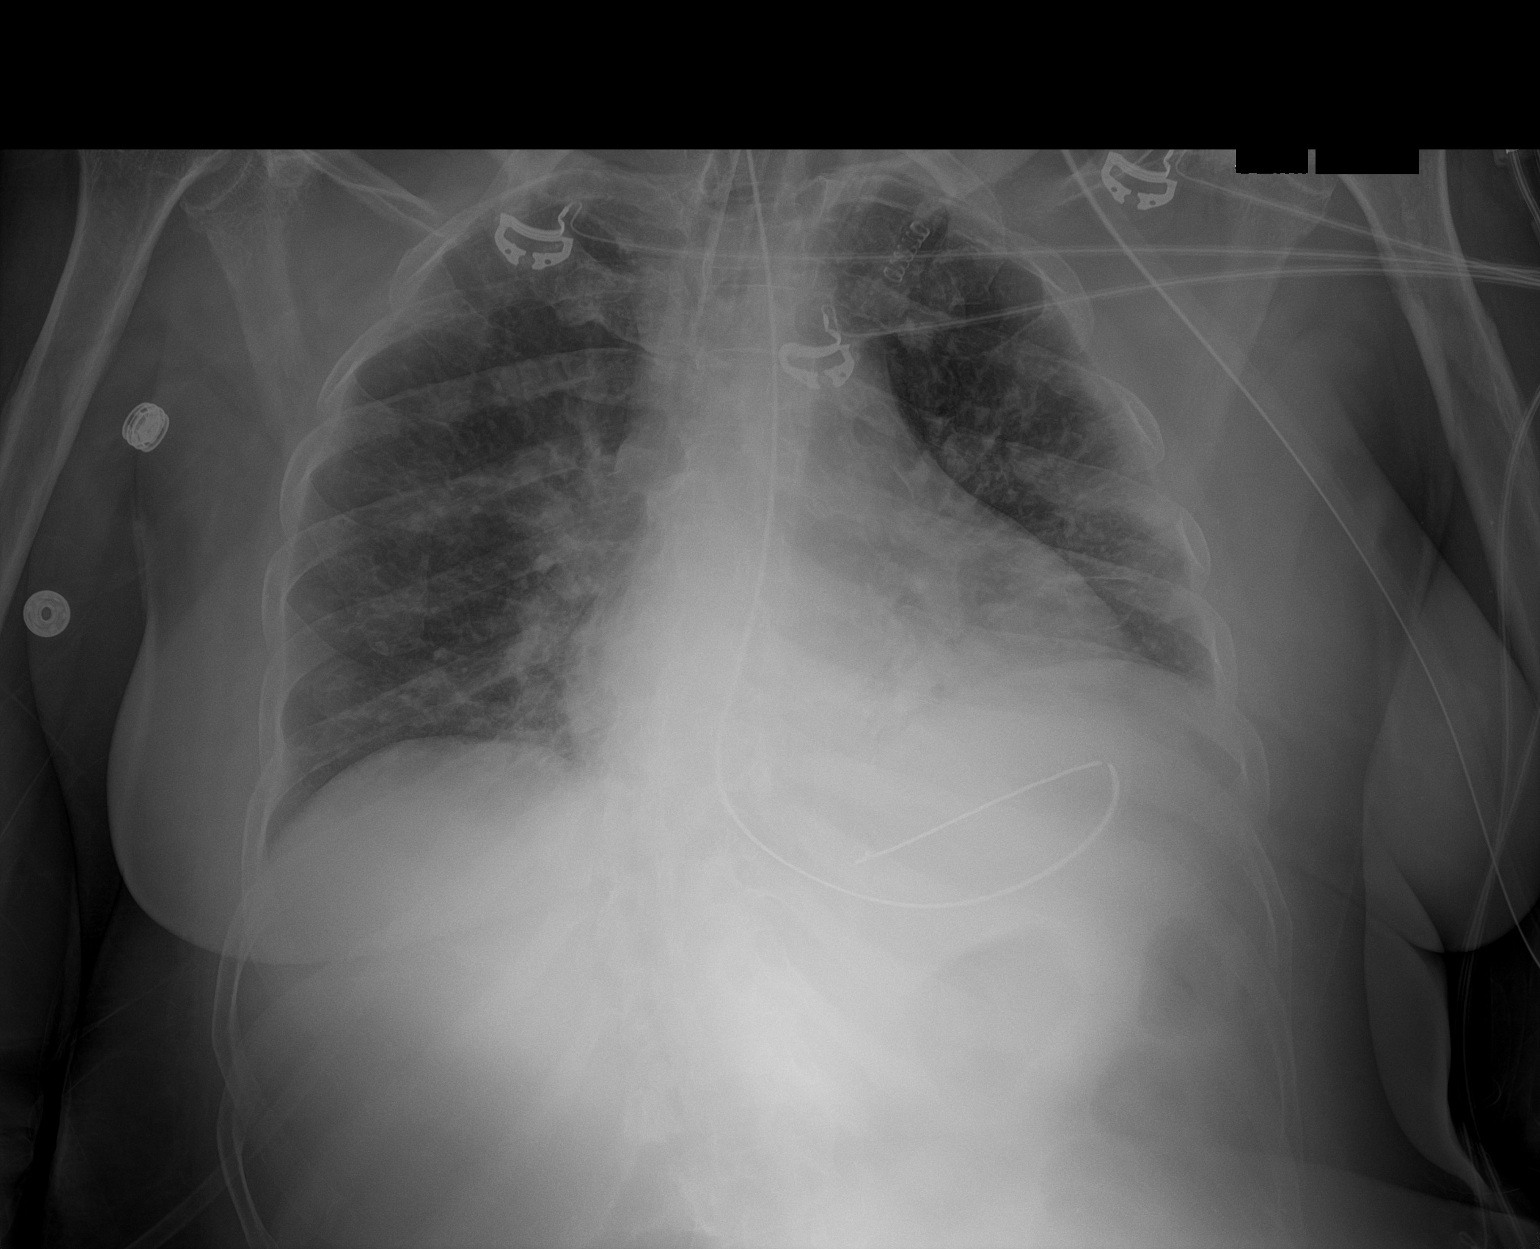

[1 of 1 positions shown; findings below may reference images not displayed]

FINDINGS: ET tube tip is above the carina. There is a nasogastric tube which
is coiled in the proximal stomach. Side port and tip are both below
the level of the GE junction.

Decreased lung volumes. Pulmonary vascular congestion. Retrocardiac
opacity is identified which may represent atelectasis or pneumonia.
IMPRESSION: 1. The nasogastric tube is coiled in the proximal stomach. The side
port and tip are both below the level of the GE junction.
2. Decreased lung volumes.

## 2019-03-17 MED ORDER — IPRATROPIUM-ALBUTEROL 0.5-2.5 (3) MG/3ML IN SOLN: 3.0000 mL | Freq: Four times a day (QID) | RESPIRATORY_TRACT | Status: DC | PRN

## 2019-03-17 MED ORDER — IPRATROPIUM-ALBUTEROL 0.5-2.5 (3) MG/3ML IN SOLN
3.0000 mL | Freq: Two times a day (BID) | RESPIRATORY_TRACT | Status: DC
  Administered 2019-03-17 – 2019-03-21 (×9): 3 mL via RESPIRATORY_TRACT
  Filled 2019-03-17 (×8): qty 3

## 2019-03-17 MED ORDER — PROPOFOL 1000 MG/100ML IV EMUL
5.0000 ug/kg/min | INTRAVENOUS | Status: DC
  Administered 2019-03-17 (×2): 20 ug/kg/min via INTRAVENOUS
  Administered 2019-03-18: 15 ug/kg/min via INTRAVENOUS
  Administered 2019-03-19: 10 ug/kg/min via INTRAVENOUS
  Administered 2019-03-19: 5 ug/kg/min via INTRAVENOUS
  Administered 2019-03-21 (×2): 10 ug/kg/min via INTRAVENOUS
  Filled 2019-03-17 (×6): qty 100
  Filled 2019-03-17: qty 200

## 2019-03-17 NOTE — Progress Notes (Signed)
Neurology Progress Note   S:// Intubated.  On sedation with fentanyl.   O:// Current vital signs: BP (!) 141/75   Pulse 60   Temp 97.7 F (36.5 C) (Oral)   Resp (!) 23   Ht '5\' 2"'$  (1.575 m)   Wt 88.4 kg   SpO2 100%   BMI 35.65 kg/m  Vital signs in last 24 hours: Temp:  [97.7 F (36.5 C)-98.4 F (36.9 C)] 97.7 F (36.5 C) (12/06 0800) Pulse Rate:  [52-62] 60 (12/06 0809) Resp:  [19-24] 23 (12/06 1300) BP: (102-147)/(56-78) 141/75 (12/06 1300) SpO2:  [97 %-100 %] 100 % (12/06 0800) FiO2 (%):  [40 %] 40 % (12/06 0809) Weight:  [88.4 kg] 88.4 kg (12/06 0500) General: Sedated with fentanyl, intubated HEENT: Normocephalic atraumatic Lungs: Clear to auscultation vented Cardiovascular: Regular rhythm Neurological exam Sedated intubated No spontaneous movements Cranial: Pupils equal round react light, extraocular movements difficult to assess, does not been to threat from either side, facial symmetry difficult ascertain. Motor/sensory exam: No withdrawal to noxious stimulation.  There is increased tone in all 4 extremities but it is much improved from my initial consultation Difficult elicit DTRs  Medications  Current Facility-Administered Medications:  .  0.9 %  sodium chloride infusion, , Intravenous, PRN, Jonetta Osgood, MD, Stopped at 03/12/19 1210 .  0.9 %  sodium chloride infusion, , Intravenous, Continuous, Sood, Vineet, MD, Last Rate: 50 mL/hr at 03/17/19 1200 .  acetaminophen (TYLENOL) tablet 650 mg, 650 mg, Per Tube, Q6H PRN, Chesley Mires, MD .  atorvastatin (LIPITOR) tablet 20 mg, 20 mg, Per Tube, q1800, Donnamae Jude, RPH, 20 mg at 03/16/19 1809 .  cefTRIAXone (ROCEPHIN) 2 g in sodium chloride 0.9 % 100 mL IVPB, 2 g, Intravenous, Q12H, Shade, Christine E, RPH, Last Rate: 200 mL/hr at 03/17/19 1305, 2 g at 03/17/19 1305 .  chlorhexidine gluconate (MEDLINE KIT) (PERIDEX) 0.12 % solution 15 mL, 15 mL, Mouth Rinse, BID, McQuaid, Douglas B, MD, 15 mL at 03/17/19  0800 .  Chlorhexidine Gluconate Cloth 2 % PADS 6 each, 6 each, Topical, Daily, Simonne Maffucci B, MD, 6 each at 03/16/19 1008 .  chlorpheniramine-HYDROcodone (TUSSIONEX) 10-8 MG/5ML suspension 5 mL, 5 mL, Oral, Q12H PRN, Peyton Bottoms, MD .  clonazePAM [facility]) tablet 2 mg, 2 mg, Per Tube, TID, Donnamae Jude, RPH, 2 mg at 03/17/19 0959 .  dexamethasone (DECADRON) injection 4 mg, 4 mg, Intravenous, Q24H, Ghimire, Henreitta Leber, MD, 4 mg at 03/17/19 1001 .  docusate (COLACE) 50 MG/5ML liquid 100 mg, 100 mg, Per Tube, Daily, Donnamae Jude, RPH, 100 mg at 03/17/19 1001 .  enoxaparin (LOVENOX) injection 40 mg, 40 mg, Subcutaneous, Q12H, Elgergawy, Silver Huguenin, MD, 40 mg at 03/17/19 1001 .  famotidine (PEPCID) 40 MG/5ML suspension 20 mg, 20 mg, Per Tube, BID, Elgergawy, Silver Huguenin, MD, 20 mg at 03/17/19 1000 .  feeding supplement (PRO-STAT SUGAR FREE 64) liquid 30 mL, 30 mL, Per Tube, BID, Simonne Maffucci B, MD, 30 mL at 03/17/19 0959 .  feeding supplement (VITAL AF 1.2 CAL) liquid 1,000 mL, 1,000 mL, Per Tube, Continuous, Elgergawy, Silver Huguenin, MD, Last Rate: 50 mL/hr at 03/17/19 0600 .  fentaNYL (SUBLIMAZE) bolus via infusion 50 mcg, 50 mcg, Intravenous, Q15 min PRN, Sood, Vineet, MD .  fentaNYL 2549mg in NS 2561m(1023mml) infusion-PREMIX, 50-200 mcg/hr, Intravenous, Continuous, SooChesley MiresD, Stopped at 03/16/19 2221 .  guaiFENesin-dextromethorphan (ROBITUSSIN DM) 100-10 MG/5ML syrup 10 mL, 10 mL, Oral, Q4H PRN, VerPeyton BottomsD, 10  mL at 03/17/19 1001 .  hydrALAZINE (APRESOLINE) injection 10 mg, 10 mg, Intravenous, Q6H PRN, Jonetta Osgood, MD, 10 mg at 03/13/19 1212 .  insulin aspart (novoLOG) injection 0-15 Units, 0-15 Units, Subcutaneous, Q4H, Chesley Mires, MD, 2 Units at 03/17/19 0519 .  ipratropium-albuterol (DUONEB) 0.5-2.5 (3) MG/3ML nebulizer solution 3 mL, 3 mL, Nebulization, BID, Sood, Vineet, MD .  ipratropium-albuterol (DUONEB) 0.5-2.5 (3) MG/3ML nebulizer solution 3 mL, 3 mL,  Nebulization, Q6H PRN, Sood, Vineet, MD .  lactobacillus (FLORANEX/LACTINEX) granules 1 g, 1 g, Per Tube, Q breakfast, Donnamae Jude, RPH, 1 g at 03/16/19 1103 .  levothyroxine (SYNTHROID, LEVOTHROID) injection 50 mcg, 50 mcg, Intravenous, Daily, Ghimire, Henreitta Leber, MD, 50 mcg at 03/17/19 1003 .  MEDLINE mouth rinse, 15 mL, Mouth Rinse, 10 times per day, Simonne Maffucci B, MD, 15 mL at 03/17/19 1310 .  metoprolol tartrate (LOPRESSOR) injection 5 mg, 5 mg, Intravenous, Q6H, Ghimire, Henreitta Leber, MD, 5 mg at 03/16/19 0548 .  metroNIDAZOLE (FLAGYL) IVPB 500 mg, 500 mg, Intravenous, Q8H, Ghimire, Henreitta Leber, MD, Stopped at 03/17/19 1014 .  midazolam (VERSED) injection 2 mg, 2 mg, Intravenous, Q2H PRN, Sood, Vineet, MD .  propofol (DIPRIVAN) 1000 MG/100ML infusion, 0-50 mcg/kg/min, Intravenous, Continuous, Sood, Vineet, MD, Last Rate: 5.32 mL/hr at 03/17/19 1200, 10 mcg/kg/min at 03/17/19 1200 .  sodium chloride flush (NS) 0.9 % injection 10-40 mL, 10-40 mL, Intracatheter, Q12H, Ghimire, Henreitta Leber, MD, 10 mL at 03/17/19 1002 .  sodium chloride flush (NS) 0.9 % injection 10-40 mL, 10-40 mL, Intracatheter, PRN, Ghimire, Henreitta Leber, MD .  vancomycin (VANCOCIN) IVPB 1000 mg/200 mL premix, 1,000 mg, Intravenous, Q12H, Randa Spike, RPH, Stopped at 03/17/19 [phone]  vitamin C (ASCORBIC ACID) tablet 500 mg, 500 mg, Per Tube, Daily, Donnamae Jude, RPH, 500 mg at 03/17/19 1001 .  zinc sulfate capsule 220 mg, 220 mg, Per Tube, Daily, Donnamae Jude, RPH, 220 mg at 03/17/19 0959 Labs CBC    Component Value Date/Time   WBC 10.2 03/17/2019 0458   RBC 4.96 03/17/2019 0458   HGB 13.1 03/17/2019 0458   HGB 13.5 01/09/2015 0809   HCT 41.5 03/17/2019 0458   HCT 40.7 01/09/2015 0809   PLT 250 03/17/2019 0458   PLT 333 01/09/2015 0809   MCV 83.7 03/17/2019 0458   MCV 81 01/09/2015 0809   MCH 26.4 03/17/2019 0458   MCHC 31.6 03/17/2019 0458   RDW 14.6 03/17/2019 0458   RDW 14.2 01/09/2015 0809   LYMPHSABS 0.9  03/16/2019 0440   MONOABS 0.7 03/16/2019 0440   EOSABS 0.0 03/16/2019 0440   BASOSABS 0.0 03/16/2019 0440    CMP     Component Value Date/Time   NA 141 03/17/2019 0458   NA 144 02/03/2016   K 4.0 03/17/2019 0458   CL 111 03/17/2019 0458   CO2 21 (L) 03/17/2019 0458   GLUCOSE 129 (H) 03/17/2019 0458   BUN 16 03/17/2019 0458   BUN 11 02/03/2016   CREATININE 0.39 (L) 03/17/2019 0458   CREATININE 1.00 07/13/2018 1023   CALCIUM 7.9 (L) 03/17/2019 0458   PROT 5.7 (L) 03/16/2019 0440   PROT 7.0 01/09/2015 0809   ALBUMIN 2.5 (L) 03/16/2019 0440   ALBUMIN 4.0 01/09/2015 0809   AST 32 03/16/2019 0440   ALT 34 03/16/2019 0440   ALKPHOS 53 03/16/2019 0440   BILITOT 0.6 03/16/2019 0440   BILITOT 0.7 01/09/2015 0809   GFRNONAA >60 03/17/2019 0458   GFRNONAA 63 07/13/2018  [place] 03/17/2019 0458   GFRAA 73 07/13/2018 1023    Imaging I have reviewed images in epic and the results pertinent to this consultation are: MRI brain no acute changes  EEG with continuous slowing, triphasics  Assessment: 58 year old woman past history of hypothyroidism, hypertension intellectual disability group home resident, developed catatonia while admitted with COVID-19 infection at [facility].  She was intubated for airway protection after she started receiving benzodiazepines for the treatment of what was presumed to be catatonia.  In spite of being intubated, she continues to have increased tone, while she is sedated with fentanyl and is receiving Klonopin 2 mg 3 times daily via OG tube. She was only on one antidepressant prior to presentation and had not been taking it for 2 or 3 days when the symptoms started so it is unlikely that this is serotonin syndrome.  She does not have hyperreflexia, hyperthermia or spontaneous clonus on exam.  She is not on neuroleptics for this to be NMS Other considerations were meningitis and nonconvulsive status epilepticus-she has been on  antibiotics for a while in there is not much in terms of high yield for a spinal tap for looking for infectious process at this time. Nonconvulsive status-unlikely due to normal EEG. Out-of-the-box other considerations include stiff person syndrome.  Impression: -Altered mental status with significantly increased tone-?  Serotonin syndrome but typical findings not present. -Other differentials include catatonia, stiff person syndrome. -CNS infection-covered with antibiotics for meningitis now for a few days.  Recommendations: -Continue empiric meningitic coverage for total of 10 days -Continue Klonopin 2 mg 3 times daily -Total CK on 03/13/2019 was 362.  Recheck CK. -Start propofol instead of fentanyl to see if that helps with the tone.  Already on benzos-Klonopin 2 mg 3 times daily. -If continues to be stiff, consider LP for oligoclonal bands and send out anti-GAD antibodies -Neurology will follow. Plan d/w Dr. Chase Caller  -- Amie Portland, MD [facility] Pager: [phone] If 7pm to 7am, please call on call as listed on AMION.  CRITICAL CARE ATTESTATION Performed by: Amie Portland, MD Total critical care time: 40 minutes Critical care time was exclusive of separately billable procedures and treating other patients and/or supervising APPs/Residents/Students Critical care was necessary to treat or prevent imminent or life-threatening deterioration due to toxic metabolic encephalopathy, hypertonia This patient is critically ill and at significant risk for neurological worsening and/or death and care requires constant monitoring. Critical care was time spent personally by me on the following activities: development of treatment plan with patient and/or surrogate as well as nursing, discussions with consultants, evaluation of patient's response to treatment, examination of patient, obtaining history from patient or surrogate, ordering and performing treatments and interventions,  ordering and review of laboratory studies, ordering and review of radiographic studies, pulse oximetry, re-evaluation of patient's condition, participation in multidisciplinary rounds and medical decision making of high complexity in the care of this patient.

## 2019-03-17 NOTE — Progress Notes (Signed)
NAME:  Rebecca Bruce, MRN:  [id number], DOB:  04-23-1960, LOS: 6 ADMISSION DATE:  03/11/2019, CONSULTATION DATE:  12/4 REFERRING MD:  Sloan Leiter, CHIEF COMPLAINT:  Inability to protect airway   Brief History    58 y/o female brought to [facility] on 11/28 in the setting of altered mental status from her group home.  She was found to have COVID pneumonia, treated with decadron and remdesivir then sent to [facility] on 11/30.  Here has had progressive increase in acute encephalopathy and has become less and less responsive.  Today PCCM was asked to see her because she reached the point that she was aspirating and not responding to pharyngeal/tracheal suctioning.  By report her exam findings have waxed and waned over the last three days but have not generally moved in the right direction.  Neurology saw her on 12/2 and apparently at that time she followed some simple commands, but she was non-verbal at the time with significant rigidity noted in her upper extremities.   At baseline lives in a group home, can bathe herself and have a conversation.  Has baseline anxiety and depression.    Past Medical History  Thyroid disease Hypertension GERD  Significant Hospital Events   11/28 admission [facility] 11/30 transfer to [facility] 12/04 intubation for inability to protect airway 12/05 add diprivan, fentanyl gtt - > Suctioned large plug by RT >> Peak pressure improved after this.  No respiratory effort with SBT.  Consults:  Neurology  Procedures:  12/4 ETT >   Significant Diagnostic Tests:  11/28 CT head > mucosal thickening in ethmoid air cells, study otherwise unremarkable  12/03 MRI Brain > normal MRI brain  12/03 EEG > mod to severe diffuse encephalopathy, non-specific etiology but could be due to toxic metabolic causes  Micro Data:  11/27 urine culture > multiple species 11/28 blood > negative 11/28 SARS COV 2 > positive 12/04 sputum >   Antimicrobials:  11/28 Vanc > 11/30, restart 12/2>  11/28  mero >  12/2 11/29 remdesivir > 12/3 12/02 ceftriaxone >  12/02 vancomycin >  12/04 flagyl >   Interim history/subjective:    12/6 - now at [facility] due to neuro issues. On fent gtt with po konopin. Neuro concerned about persistent increase in muscular tone despite sedation gtt. Neuro does not think this is serotonin syndrome (dc anti depresssant prior to admit and no clonus). No evidence of NMS or non convulsive status. Consideration for stiff person syndrome +   Objective   Blood pressure (!) 141/75, pulse 60, temperature 97.7 F (36.5 C), temperature source Oral, resp. rate (!) 23, height 5\' 2"  (1.575 m), weight 88.4 kg, SpO2 100 %.    Vent Mode: PRVC FiO2 (%):  [40 %] 40 % Set Rate:  [24 bmp] 24 bmp Vt Set:  [400 mL] 400 mL PEEP:  [5 cmH20] 5 cmH20 Plateau Pressure:  [14 cmH20-18 cmH20] 18 cmH20   Intake/Output Summary (Last 24 hours) at 03/17/2019 1339 Last data filed at 03/17/2019 1200 Gross per 24 hour  Intake 3645.97 ml  Output 1150 ml  Net 2495.97 ml   Filed Weights   03/12/19 1201 03/16/19 0500 03/17/19 0500  Weight: 85 kg 88.6 kg 88.4 kg    Examination: General Appearance:  Looks criticall ill OBESE - + Head:  Normocephalic, without obvious abnormality, atraumatic Eyes:  PERRL - yes, conjunctiva/corneas - muddy     Ears:  Normal external ear canals, both ears Nose:  G tube -  no Throat:  ETT TUBE - yes , OG tube - yes Neck:  Supple,  No enlargement/tenderness/nodules Lungs: Clear to auscultation bilaterally, Ventilator   Synchrony - yes Heart:  S1 and S2 normal, no murmur, CVP - x.  Pressors - no Abdomen:  Soft, no masses, no organomegaly Genitalia / Rectal:  Not done Extremities:  Extremities- intact Skin:  ntact in exposed areas . Sacral area - not examined Neurologic:   -> RASS - -4      Resolved Hospital Problem list     Assessment & Plan:   Acute hypoxic respiratory failure from COVID pneumonia. - completed remdesivir    03/17/2019 - > does not meet criteria for SBT/Extubation in setting of Acute Respiratory Failure due to sedation and muscle tone  Plan - day 6/10 decadron - PRVC - goal SpO2 88 to 95% - f/u CXR - scheduled BDs - continue zinc, vit C  Acute metabolic encephalopathy. Severe Catatonic reaction of uncertain etiology. Hx of intellectual disability. - MRI, EEG unrevealing for cause  03/17/2019 - unchanged. CT 362 on 12/2  plan - d/w neuro - change to diprivan (track lactate and cpk; also on lipitor) - prn versed - arrange for transfer to [facility] for further neuro monitoring - f/u CPK - hold outpt paxil - klonopin 2 mg tid - might have to reduce - day 4 of ABx  Hypothyroidism. - continue synthroid  Hx of HTN, HLD. - goal SBP < 160 - continue lipitor  Steroid induced hyperglycemia. - SSI   Best practice:  Diet: tube feeding DVT prophylaxis: lovenox per protocol GI prophylaxis: famotidine Mobility: bed rest Code Status: full Disposition: ICU  Family: PEr Marney Doctor - the sister - ok to talk to her. Updated.     ATTESTATION & SIGNATURE   The patient Rebecca Bruce is critically ill with multiple organ systems failure and requires high complexity decision making for assessment and support, frequent evaluation and titration of therapies, application of advanced monitoring technologies and extensive interpretation of multiple databases.   Critical Care Time devoted to patient care services described in this note is  30  Minutes. This time reflects time of care of this signee Dr Brand Males. This critical care time does not reflect procedure time, or teaching time or supervisory time of PA/NP/Med student/Med Resident etc but could involve care discussion time     Dr. Brand Males, M.D., [facility].C.P Pulmonary and Critical Care Medicine Staff Physician [facility] Pager: [phone], If no answer or between  15:00h -  7:00h: call [phone]  03/17/2019 1:39 PM    LABS    PULMONARY Recent Labs  Lab 03/15/19 1727  PHART 7.390  PCO2ART 42.5  PO2ART 247.0*  HCO3 25.7  TCO2 27  O2SAT 100.0    CBC Recent Labs  Lab 03/15/19 0047 03/15/19 1727 03/16/19 0440 03/17/19 0458  HGB 14.9 13.9 13.6 13.1  HCT 47.1* 41.0 43.5 41.5  WBC 7.5  --  10.5 10.2  PLT 313  --  322 250    COAGULATION No results for input(s): INR in the last 168 hours.  CARDIAC  No results for input(s): TROPONINI in the last 168 hours. No results for input(s): PROBNP in the last 168 hours.   CHEMISTRY Recent Labs  Lab 03/13/19 0100 03/14/19 0100 03/15/19 0047 03/15/19 1727 03/15/19 1850 03/16/19 0440 03/17/19 0458  NA 142 143 141 141  --  141 141  K  4.0 3.3* 3.6 3.1*  --  3.3* 4.0  CL 109 109 106  --   --  107 111  CO2 22 22 24   --   --  26 21*  GLUCOSE 94 102* 102*  --   --  126* 129*  BUN 16 20 20   --   --  21* 16  CREATININE 0.61 0.55 0.46  --   --  0.59 0.39*  CALCIUM 8.3* 8.1* 8.2*  --   --  8.0* 7.9*  MG  --   --   --   --  2.1 2.1 2.2  PHOS  --   --   --   --  2.8 2.3* 2.6   Estimated Creatinine Clearance: 80.1 mL/min (A) (by C-G formula based on SCr of 0.39 mg/dL (L)).   LIVER Recent Labs  Lab 03/12/19 0246 03/13/19 0100 03/14/19 0100 03/15/19 0047 03/16/19 0440  AST 63* 62* 56* 39 32  ALT 37 41 44 42 34  ALKPHOS 64 64 60 59 53  BILITOT 1.0 1.2 1.1 1.0 0.6  PROT 7.2 6.8 6.5 6.6 5.7*  ALBUMIN 3.2* 3.1* 3.0* 3.0* 2.5*     INFECTIOUS Recent Labs  Lab 03/11/19 0449  PROCALCITON <0.10     ENDOCRINE CBG (last 3)  Recent Labs    03/17/19 0015 03/17/19 0515 03/17/19 0957  GLUCAP 131* 130* 111*         IMAGING x48h  - image(s) personally visualized  -   highlighted in bold Dg Chest Port 1 View  Result Date: 03/17/2019 CLINICAL DATA:  Respiratory failure. EXAM: PORTABLE CHEST 1 VIEW COMPARISON:  03/15/2019 FINDINGS: ET tube tip is above the carina. There is a  nasogastric tube which is coiled in the proximal stomach. Side port and tip are both below the level of the GE junction. Decreased lung volumes. Pulmonary vascular congestion. Retrocardiac opacity is identified which may represent atelectasis or pneumonia. IMPRESSION: 1. The nasogastric tube is coiled in the proximal stomach. The side port and tip are both below the level of the GE junction. 2. Decreased lung volumes. Electronically Signed   By: Kerby Moors M.D.   On: 03/17/2019 10:06   Dg Chest Port 1 View  Result Date: 03/15/2019 CLINICAL DATA:  58 year old female status post intubation. EXAM: PORTABLE CHEST 1 VIEW COMPARISON:  Chest radiograph dated 03/10/2019. FINDINGS: Endotracheal tube with tip close to the carina tilting towards the right mainstem bronchus. Recommend retraction by approximately 4 cm. Enteric tube extends below the diaphragm with tip in the gastric fundus. No focal consolidation, pleural effusion, or pneumothorax. Stable cardiac silhouette. No acute osseous pathology. IMPRESSION: Endotracheal tube with tip close to the carina tilting towards the right mainstem bronchus. Recommend retraction by 4 cm. These results were called by telephone at the time of interpretation on 03/15/2019 at 5:14 pm to provider Dr Domenic Moras who verbally acknowledged these results. Electronically Signed   By: Anner Crete M.D.   On: 03/15/2019 17:31

## 2019-03-18 ENCOUNTER — Inpatient Hospital Stay (HOSPITAL_COMMUNITY): Payer: Medicare Other

## 2019-03-18 LAB — CBC WITH DIFFERENTIAL/PLATELET
Abs Immature Granulocytes: 0.08 10*3/uL — ABNORMAL HIGH (ref 0.00–0.07)
Basophils Absolute: 0 10*3/uL (ref 0.0–0.1)
Basophils Relative: 0 %
Eosinophils Absolute: 0 10*3/uL (ref 0.0–0.5)
Eosinophils Relative: 0 %
HCT: 38.8 % (ref 36.0–46.0)
Hemoglobin: 12.5 g/dL (ref 12.0–15.0)
Immature Granulocytes: 1 %
Lymphocytes Relative: 10 %
Lymphs Abs: 0.9 10*3/uL (ref 0.7–4.0)
MCH: 26.6 pg (ref 26.0–34.0)
MCHC: 32.2 g/dL (ref 30.0–36.0)
MCV: 82.6 fL (ref 80.0–100.0)
Monocytes Absolute: 0.9 10*3/uL (ref 0.1–1.0)
Monocytes Relative: 10 %
Neutro Abs: 6.7 10*3/uL (ref 1.7–7.7)
Neutrophils Relative %: 79 %
Platelets: 246 10*3/uL (ref 150–400)
RBC: 4.7 MIL/uL (ref 3.87–5.11)
RDW: 14.5 % (ref 11.5–15.5)
WBC: 8.5 10*3/uL (ref 4.0–10.5)
nRBC: 0 % (ref 0.0–0.2)

## 2019-03-18 LAB — BASIC METABOLIC PANEL
Anion gap: 10 (ref 5–15)
BUN: 12 mg/dL (ref 6–20)
CO2: 24 mmol/L (ref 22–32)
Calcium: 7.8 mg/dL — ABNORMAL LOW (ref 8.9–10.3)
Chloride: 107 mmol/L (ref 98–111)
Creatinine, Ser: 0.43 mg/dL — ABNORMAL LOW (ref 0.44–1.00)
GFR calc Af Amer: >60 mL/min (ref >60)
GFR calc non Af Amer: >60 mL/min (ref >60)
Glucose, Bld: 124 mg/dL — ABNORMAL HIGH (ref 70–99)
Potassium: 4.1 mmol/L (ref 3.5–5.1)
Sodium: 141 mmol/L (ref 135–145)

## 2019-03-18 LAB — TRIGLYCERIDES: Triglycerides: 35 mg/dL (ref <150)

## 2019-03-18 LAB — GLUCOSE, CAPILLARY
Glucose-Capillary: 156 mg/dL — ABNORMAL HIGH (ref 70–99)
Glucose-Capillary: 131 mg/dL — ABNORMAL HIGH (ref 70–99)
Glucose-Capillary: 125 mg/dL — ABNORMAL HIGH (ref 70–99)
Glucose-Capillary: 112 mg/dL — ABNORMAL HIGH (ref 70–99)
Glucose-Capillary: 103 mg/dL — ABNORMAL HIGH (ref 70–99)
Glucose-Capillary: 143 mg/dL — ABNORMAL HIGH (ref 70–99)
Glucose-Capillary: 131 mg/dL — ABNORMAL HIGH (ref 70–99)

## 2019-03-18 LAB — PHOSPHORUS: Phosphorus: 2.3 mg/dL — ABNORMAL LOW (ref 2.5–4.6)

## 2019-03-18 LAB — CK TOTAL AND CKMB (NOT AT ARMC)
CK, MB: 3.3 ng/mL (ref 0.5–5.0)
Relative Index: INVALID (ref 0.0–2.5)
Total CK: 65 U/L (ref 38–234)

## 2019-03-18 LAB — MAGNESIUM: Magnesium: 2 mg/dL (ref 1.7–2.4)

## 2019-03-18 LAB — LACTIC ACID, PLASMA: Lactic Acid, Venous: 1.9 mmol/L (ref 0.5–1.9)

## 2019-03-18 IMAGING — DX DG CHEST 1V PORT
1 series · 1 of 1 positions shown · non-contrast
Comparison: [DATE].  [DATE].

CLINICAL DATA: Intubation.

EXAM:
PORTABLE CHEST 1 VIEW

[chest]
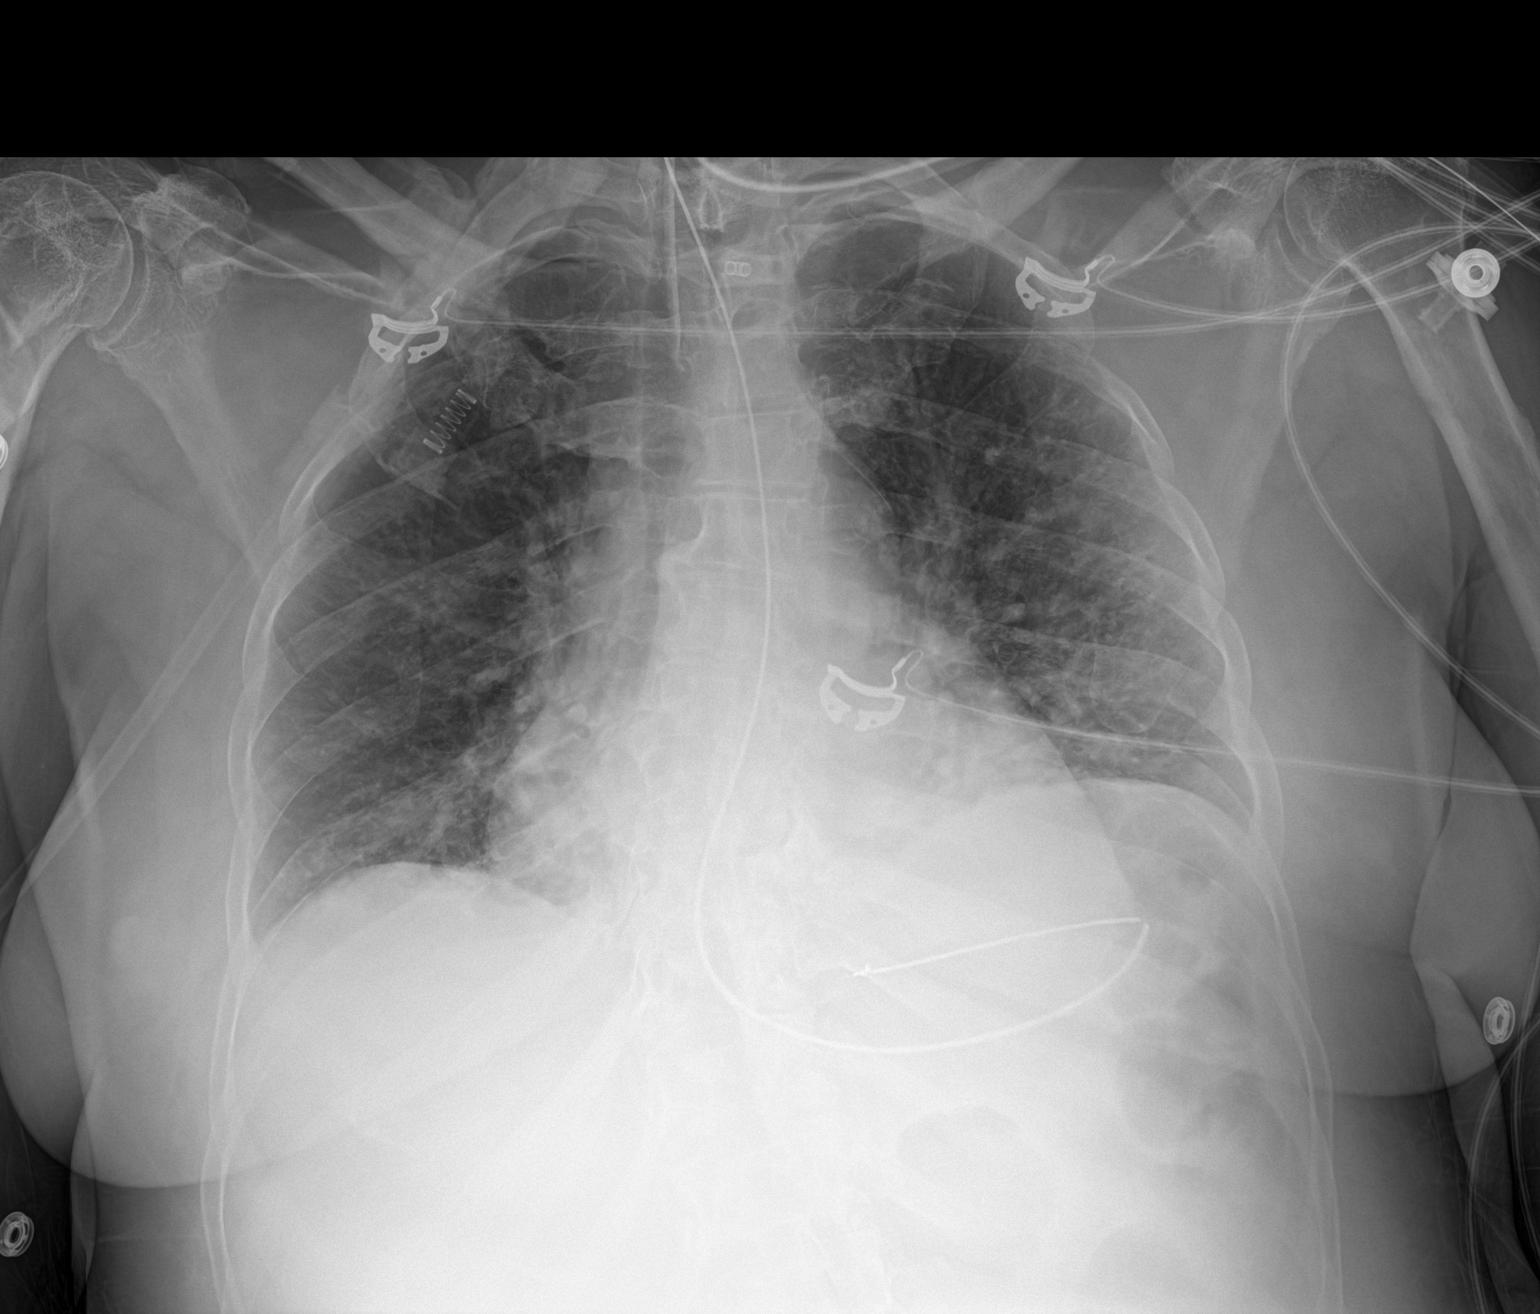

[1 of 1 positions shown; findings below may reference images not displayed]

FINDINGS: Endotracheal tube and NG tube in stable position. Heart size stable.
Low lung volumes. Unchanged mild left mid lung and bibasilar
atelectasis/infiltrates. No pleural effusion or pneumothorax.
Degenerative changes and scoliosis thoracic spine.
IMPRESSION: 1.  Lines and tubes stable position.

2. Lung volumes. Unchanged mild left mid lung and bibasilar
atelectasis/infiltrates.

## 2019-03-18 MED ORDER — CHLORHEXIDINE GLUCONATE 0.12 % MT SOLN
OROMUCOSAL | Status: AC
  Filled 2019-03-18: qty 15

## 2019-03-18 NOTE — Progress Notes (Signed)
NAME:  Rebecca Bruce, MRN:  [id number], DOB:  1961-01-17, LOS: 7 ADMISSION DATE:  03/11/2019, CONSULTATION DATE:  12/4 REFERRING MD:  Sloan Leiter, CHIEF COMPLAINT:  Inability to protect airway   Brief History    58 y/o female brought to [facility] on 11/28 in the setting of altered mental status from her group home.  She was found to have COVID pneumonia, treated with decadron and remdesivir then sent to [facility] on 11/30.  Here has had progressive increase in acute encephalopathy and has become less and less responsive.  Today PCCM was asked to see her because she reached the point that she was aspirating and not responding to pharyngeal/tracheal suctioning.  By report her exam findings have waxed and waned over the last three days but have not generally moved in the right direction.  Neurology saw her on 12/2 and apparently at that time she followed some simple commands, but she was non-verbal at the time with significant rigidity noted in her upper extremities.   At baseline lives in a group home, can bathe herself and have a conversation.  Has baseline anxiety and depression.    Past Medical History  Thyroid disease Hypertension GERD  Significant Hospital Events   11/28 admission [facility] 11/30 transfer to [facility] 12/04 intubation for inability to protect airway 12/05 add diprivan, fentanyl gtt - > Suctioned large plug by RT >> Peak pressure improved after this.  No respiratory effort with SBT.  Consults:  Neurology  Procedures:  12/4 ETT >   Significant Diagnostic Tests:  11/28 CT head > mucosal thickening in ethmoid air cells, study otherwise unremarkable  12/03 MRI Brain > normal MRI brain  12/03 EEG > mod to severe diffuse encephalopathy, non-specific etiology but could be due to toxic metabolic causes  Micro Data:  11/27 urine culture > multiple species 11/28 blood > negative 11/28 SARS COV 2 > positive 12/04 sputum >   Antimicrobials:  11/28 Vanc > 11/30, restart 12/2>  11/28  mero >  12/2 11/29 remdesivir > 12/3 12/02 ceftriaxone >  12/02 vancomycin >  12/04 flagyl >   Interim history/subjective:  Remains unresponsive on vent.  Objective   Blood pressure (!) 188/99, pulse 75, temperature 98.5 F (36.9 C), temperature source Oral, resp. rate (!) 25, height 5\' 2"  (1.575 m), weight 89.5 kg, SpO2 100 %.    Vent Mode: PRVC FiO2 (%):  [40 %] 40 % Set Rate:  [24 bmp] 24 bmp Vt Set:  [400 mL] 400 mL PEEP:  [5 cmH20] 5 cmH20 Plateau Pressure:  [11 cmH20-18 cmH20] 18 cmH20   Intake/Output Summary (Last 24 hours) at 03/18/2019 1240 Last data filed at 03/18/2019 1209 Gross per 24 hour  Intake 3504.54 ml  Output 1775 ml  Net 1729.54 ml   Filed Weights   03/16/19 0500 03/17/19 0500 03/18/19 0454  Weight: 88.6 kg 88.4 kg 89.5 kg    Examination: GEN: middle aged woman in NAD HEENT: ETT in place, minimal secretions CV: RRR, ext warm PULM: Clear, no accessory muscle use GI: Soft, +BS EXT: Trace anasarca NEURO: Opens eyes to pain, no motor response to pain, increased muscle tone noted PSYCH:cannot assess SKIN: No rashes  Chemistries look okay Lactate benign CBC benign  Resolved Hospital Problem list     Assessment & Plan:   # Acute hypoxic respiratory failure from COVID pneumonia.- resolved but mental status makes extubation tricky # Persistent encephalopathy with catatonia- EEG, MRI benign, neurology following # Underlying MR- resides in group home,  able to perform most ADLs on own # Hypothyroidism- TSH WNL - completed remdesivir - benzodiazepines, possible IVIG at some point, and need for LP per neurology - doing fine on PS, strong cough, probably could extubate but want to clarify re-intubation status with family  Best practice:  Diet: tube feeding DVT prophylaxis: lovenox per protocol GI prophylaxis: famotidine Mobility: bed rest Code Status: full Disposition: ICU  Family: updated sister at time of evaluation  35 minutes critical care  time

## 2019-03-18 NOTE — Plan of Care (Signed)
  Problem: Respiratory: Goal: Will maintain a patent airway Outcome: Progressing   Problem: Nutrition: Goal: Adequate nutrition will be maintained Outcome: Progressing Note: Pt is tolerating TF well    Problem: Elimination: Goal: Will not experience complications related to bowel motility Outcome: Progressing Goal: Will not experience complications related to urinary retention Outcome: Progressing   Problem: Pain Managment: Goal: General experience of comfort will improve Outcome: Progressing   Problem: Activity: Goal: Risk for activity intolerance will decrease Outcome: Not Progressing Note: Pt is unresponsive and bedbound

## 2019-03-18 NOTE — Progress Notes (Addendum)
NEUROLOGY PROGRESS NOTE  Subjective: Patient currently nonresponsive other than to pain - blinks her eyes  Exam: Vitals:   03/18/19 0853 03/18/19 0900  BP:  (!) 157/96  Pulse:  76  Resp:  12  Temp: 97.8 F (36.6 C)   SpO2:  100%    ROS-unable to obtain secondary to patient being severely encephalopathic and also on propofol  Physical Exam  Constitutional: Appears well-developed and well-nourished.  Psych: Nonresponsive other than to pain Eyes: Positive scleral injection Head: Normocephalic.  Cardiovascular: Normal rate and regular rhythm.  Respiratory: Effort normal, non-labored breathing GI: Soft.   distension.  Skin: WDI   Neuro:  Mental Status: Patient currently intubated and only responds to noxious stimuli in lower extremities.  To noxious stimuli patient does slightly wince her eyes. Cranial Nerves: II: No blink to threat III,IV, VI: Doll's response intact, bilaterally pupils equal, round, sluggishly reactive to light and accommodation, corneal reflexes intact.  Patient has a persistent upward gaze V,VII: Intubated Motor: No withdrawal from noxious stimuli in upper or lower extremities Significantly increased tone in bilateral lower extremities.  Increased tone in bilateral upper extremities Sensory: Moves eyes with slight clenching to noxious stimuli Deep Tendon Reflexes: 1+ bilateral upper extremities, unable to elicit reflexes in lower extremities Plantars: Mute bilaterally   Medications:  Scheduled: . atorvastatin  20 mg Per Tube q1800  . chlorhexidine gluconate (MEDLINE KIT)  15 mL Mouth Rinse BID  . Chlorhexidine Gluconate Cloth  6 each Topical Daily  . clonazePAM  2 mg Per Tube TID  . dexamethasone  4 mg Intravenous Q24H  . docusate  100 mg Per Tube Daily  . enoxaparin (LOVENOX) injection  40 mg Subcutaneous Q12H  . famotidine  20 mg Per Tube BID  . feeding supplement (PRO-STAT SUGAR FREE 64)  30 mL Per Tube BID  . insulin aspart  0-15 Units  Subcutaneous Q4H  . ipratropium-albuterol  3 mL Nebulization BID  . lactobacillus  1 g Per Tube Q breakfast  . levothyroxine  50 mcg Intravenous Daily  . mouth rinse  15 mL Mouth Rinse 10 times per day  . metoprolol tartrate  5 mg Intravenous Q6H  . sodium chloride flush  10-40 mL Intracatheter Q12H  . vitamin C  500 mg Per Tube Daily  . zinc sulfate  220 mg Per Tube Daily   Continuous: . sodium chloride Stopped (03/12/19 1210)  . sodium chloride 50 mL/hr at 03/18/19 0600  . cefTRIAXone (ROCEPHIN)  IV Stopped (03/17/19 2252)  . feeding supplement (VITAL AF 1.2 CAL) 50 mL/hr at 03/18/19 0622  . fentaNYL infusion INTRAVENOUS Stopped (03/16/19 2221)  . metronidazole Stopped (03/18/19 0158)  . propofol (DIPRIVAN) infusion Stopped (03/18/19 0943)  . vancomycin 200 mL/hr at 03/18/19 0600   VZD:GLOVFI chloride, acetaminophen, chlorpheniramine-HYDROcodone, fentaNYL, guaiFENesin-dextromethorphan, hydrALAZINE, ipratropium-albuterol, midazolam, sodium chloride flush  Pertinent Labs/Diagnostics: -CT head: Unremarkable -MRI brain: Normal for age MRI appearance of the brain.  -EEG 12/3: Consistent with a moderate to severe diffuse encephalopathy, in the setting of generalized triphasic waves.  The findings could be secondary to toxic-metabolic causes. No definite epileptiform discharges noted  -Total CK 65 down from 362 five days ago -C-reactive protein 2.5, down from 11.2 seven days ago   Etta Quill PA-C [facility] [phone]  Assessment:  58 year old female with past medical history of hypothyroidism, hypertension, developed catatonia while admitted with Covid 19 infection at [facility].  Patient was intubated for airway protection after she started receiving benzodiazepines for treatment  of what was presumed to be catatonia.   1. Despite being sedated with propofol and on Klonopin 2 mg TID, she continues to have increased tone. 2. This is felt unlikely to be due to  serotonin syndrome.  She does not have hyperreflexia, hyperthermia or spontaneous clonus on exam.   3. NMS also unlikely as she is also not on any neuroleptics. Also not withdrawing from a dopaminergic agent.  4. EEG on 12/3 showed no epileptiform activity, but was consistent with an encephalopathy given generalized triphasic waves. Nonconvulsive status unlikely given the above.  5. On today's exam, the patient continues to be very sedated/encephalopathic, only responding to noxious stimuli with vertical gaze but does have corneal reflexes, dolls reflex and pupillary reflexes.   6. Patient is on CNS coverage for possible meningitis. 7. Catatonia continues to be the most likely syndromic diagnosis. Regarding etiology, Covid encephalopathy is relatively high on the DDx. Stiff Person Syndrome is also on the DDx, but is unlikely given its rarity and more likely etiology above.  Recommendations: -Continue Klonopin 2 mg TID --Continue empiric meningitic coverage for total of 10 days --If continues to be stiff on Tuesday, consider LP for oligoclonal bands and send out anti-GAD antibodies --Repeat EEG (ordered)   Electronically signed: Dr. Kerney Elbe 03/18/2019, 9:59 AM

## 2019-03-19 ENCOUNTER — Inpatient Hospital Stay (HOSPITAL_COMMUNITY): Payer: Medicare Other

## 2019-03-19 DIAGNOSIS — G9349 Other encephalopathy: Secondary | ICD-10-CM

## 2019-03-19 LAB — CBC WITH DIFFERENTIAL/PLATELET
Abs Immature Granulocytes: 0.18 10*3/uL — ABNORMAL HIGH (ref 0.00–0.07)
Basophils Absolute: 0 10*3/uL (ref 0.0–0.1)
Basophils Relative: 0 %
Eosinophils Absolute: 0.1 10*3/uL (ref 0.0–0.5)
Eosinophils Relative: 1 %
HCT: 42.7 % (ref 36.0–46.0)
Hemoglobin: 13.8 g/dL (ref 12.0–15.0)
Immature Granulocytes: 2 %
Lymphocytes Relative: 16 %
Lymphs Abs: 1.5 10*3/uL (ref 0.7–4.0)
MCH: 27.3 pg (ref 26.0–34.0)
MCHC: 32.3 g/dL (ref 30.0–36.0)
MCV: 84.4 fL (ref 80.0–100.0)
Monocytes Absolute: 1.5 10*3/uL — ABNORMAL HIGH (ref 0.1–1.0)
Monocytes Relative: 15 %
Neutro Abs: 6.5 10*3/uL (ref 1.7–7.7)
Neutrophils Relative %: 66 %
Platelets: 270 10*3/uL (ref 150–400)
RBC: 5.06 MIL/uL (ref 3.87–5.11)
RDW: 15.2 % (ref 11.5–15.5)
WBC: 9.8 10*3/uL (ref 4.0–10.5)
nRBC: 0 % (ref 0.0–0.2)

## 2019-03-19 LAB — GLUCOSE, CAPILLARY
Glucose-Capillary: 108 mg/dL — ABNORMAL HIGH (ref 70–99)
Glucose-Capillary: 119 mg/dL — ABNORMAL HIGH (ref 70–99)
Glucose-Capillary: 120 mg/dL — ABNORMAL HIGH (ref 70–99)
Glucose-Capillary: 126 mg/dL — ABNORMAL HIGH (ref 70–99)
Glucose-Capillary: 127 mg/dL — ABNORMAL HIGH (ref 70–99)
Glucose-Capillary: 133 mg/dL — ABNORMAL HIGH (ref 70–99)
Glucose-Capillary: 134 mg/dL — ABNORMAL HIGH (ref 70–99)

## 2019-03-19 LAB — POCT I-STAT 7, (LYTES, BLD GAS, ICA,H+H)
Bicarbonate: 25.8 mmol/L (ref 20.0–28.0)
Calcium, Ion: 1.17 mmol/L (ref 1.15–1.40)
HCT: 48 % — ABNORMAL HIGH (ref 36.0–46.0)
Hemoglobin: 16.3 g/dL — ABNORMAL HIGH (ref 12.0–15.0)
O2 Saturation: 97 %
Patient temperature: 98.6
Potassium: 3.6 mmol/L (ref 3.5–5.1)
Sodium: 140 mmol/L (ref 135–145)
TCO2: 27 mmol/L (ref 22–32)
pCO2 arterial: 45.7 mmHg (ref 32.0–48.0)
pH, Arterial: 7.36 (ref 7.350–7.450)
pO2, Arterial: 92 mmHg (ref 83.0–108.0)

## 2019-03-19 LAB — CULTURE, RESPIRATORY W GRAM STAIN: Culture: NORMAL

## 2019-03-19 LAB — TRIGLYCERIDES: Triglycerides: 19 mg/dL (ref <150)

## 2019-03-19 LAB — PHOSPHORUS: Phosphorus: 3.9 mg/dL (ref 2.5–4.6)

## 2019-03-19 LAB — MAGNESIUM: Magnesium: 2.1 mg/dL (ref 1.7–2.4)

## 2019-03-19 NOTE — Progress Notes (Addendum)
Subjective: Intubated. Off sedation.   Objective: Current vital signs: BP 140/81 (BP Location: Left Leg)   Pulse 64   Temp 97.7 F (36.5 C) (Oral)   Resp (!) 24   Ht _0  (1.575 m)   Wt 89.2 kg   SpO2 100%   BMI 35.97 kg/m  Vital signs in last 24 hours: Temp:  [97.7 F (36.5 C)-98.7 F (37.1 C)] 97.7 F (36.5 C) (12/08 0430) Pulse Rate:  [54-90] 64 (12/08 0800) Resp:  [12-25] 24 (12/08 0800) BP: (124-188)/(67-99) 140/81 (12/08 0800) SpO2:  [100 %] 100 % (12/08 0800) FiO2 (%):  [40 %] 40 % (12/08 0800) Weight:  [89.2 kg] 89.2 kg (12/08 0422)  Intake/Output from previous day: 12/07 0701 - 12/08 0700 In: 3130.9 [I.V.:1336.1; NG/GT:1200; IV Piggyback:594.8] Out: 2680 [Urine:2555; Stool:125] Intake/Output this shift: Total I/O In: 56.8 [I.V.:56.8] Out: 60 [Urine:60] Nutritional status:  Diet Order            Diet NPO time specified  Diet effective now             HEENT: [place]/AT Lungs: Intubated Ext: Diffuse edema  Neurologic Exam: Ment: Eyes will open partially to noxious stimuli. Does not attend to any sensory stimuli. Does not follow commands. No attempts to communicate. Eyes deviated upwards. Will not gaze towards or away from visual stimuli.  CN: Pupils are equal. Inconsistently blinks to threat. Eyes deviated upwards. Weak doll's eye reflex noted. Face flaccidly symmetric.  Motor/Sensory: Increased extensor tone BUE, with flaccid flexor tone at elbows. No asymmetry. No movement to noxious.  Increased extensor tone at hips and left knee. No movement to noxious stimuli.  Reflexes: Brisk low amplitude reflexes bilateral biceps and patellae. Toes tonically upgoing.   Lab Results: Results for orders placed or performed during the hospital encounter of 03/11/19 (from the past 48 hour(s))  Glucose, capillary     Status: Abnormal   Collection Time: 03/17/19  9:57 AM  Result Value Ref Range   Glucose-Capillary 111 (H) 70 - 99 mg/dL  Glucose, capillary     Status:  Abnormal   Collection Time: 03/17/19  2:07 PM  Result Value Ref Range   Glucose-Capillary 165 (H) 70 - 99 mg/dL  Triglycerides     Status: None   Collection Time: 03/17/19  2:15 PM  Result Value Ref Range   Triglycerides 42 <150 mg/dL    Comment: Performed at [facility] [address], [place], [place] [address]  Hepatic function panel     Status: Abnormal   Collection Time: 03/17/19  2:15 PM  Result Value Ref Range   Total Protein 5.3 (L) 6.5 - 8.1 g/dL   Albumin 2.3 (L) 3.5 - 5.0 g/dL   AST 51 (H) 15 - 41 U/L   ALT 48 (H) 0 - 44 U/L   Alkaline Phosphatase 51 38 - 126 U/L   Total Bilirubin 0.5 0.3 - 1.2 mg/dL   Bilirubin, Direct 0.4 (H) 0.0 - 0.2 mg/dL   Indirect Bilirubin 0.1 (L) 0.3 - 0.9 mg/dL    Comment: Performed at [facility] [address] [place], [place] [address]  Glucose, capillary     Status: Abnormal   Collection Time: 03/17/19  4:04 PM  Result Value Ref Range   Glucose-Capillary 166 (H) 70 - 99 mg/dL  Glucose, capillary     Status: Abnormal   Collection Time: 03/17/19  7:41 PM  Result Value Ref Range   Glucose-Capillary 172 (H) 70 - 99  mg/dL  Glucose, capillary     Status: Abnormal   Collection Time: 03/17/19 11:16 PM  Result Value Ref Range   Glucose-Capillary 153 (H) 70 - 99 mg/dL  BMET once  in am     Status: Abnormal   Collection Time: 03/17/19 11:54 PM  Result Value Ref Range   Sodium 141 135 - 145 mmol/L   Potassium 4.1 3.5 - 5.1 mmol/L   Chloride 107 98 - 111 mmol/L   CO2 24 22 - 32 mmol/L   Glucose, Bld 124 (H) 70 - 99 mg/dL   BUN 12 6 - 20 mg/dL   Creatinine, Ser 0.43 (L) 0.44 - 1.00 mg/dL   Calcium 7.8 (L) 8.9 - 10.3 mg/dL   GFR calc non Af Amer >60 >60 mL/min   GFR calc Af Amer >60 >60 mL/min   Anion gap 10 5 - 15    Comment: Performed at [facility] [address], [place], [place] [address]  Lactic acid, plasma     Status: None   Collection Time: 03/17/19 11:54 PM  Result Value Ref Range   Lactic Acid, Venous  1.9 0.5 - 1.9 mmol/L    Comment: Performed at [facility] [address] [place], [place] [address]  Triglycerides     Status: None   Collection Time: 03/17/19 11:54 PM  Result Value Ref Range   Triglycerides 35 <150 mg/dL    Comment: Performed at [facility] [address], [place], [place] [address]  CBC with Differential     Status: Abnormal   Collection Time: 03/17/19 11:54 PM  Result Value Ref Range   WBC 8.5 4.0 - 10.5 K/uL   RBC 4.70 3.87 - 5.11 MIL/uL   Hemoglobin 12.5 12.0 - 15.0 g/dL   HCT 38.8 36.0 - 46.0 %   MCV 82.6 80.0 - 100.0 fL   MCH 26.6 26.0 - 34.0 pg   MCHC 32.2 30.0 - 36.0 g/dL   RDW 14.5 11.5 - 15.5 %   Platelets 246 150 - 400 K/uL   nRBC 0.0 0.0 - 0.2 %   Neutrophils Relative % 79 %   Neutro Abs 6.7 1.7 - 7.7 K/uL   Lymphocytes Relative 10 %   Lymphs Abs 0.9 0.7 - 4.0 K/uL   Monocytes Relative 10 %   Monocytes Absolute 0.9 0.1 - 1.0 K/uL   Eosinophils Relative 0 %   Eosinophils Absolute 0.0 0.0 - 0.5 K/uL   Basophils Relative 0 %   Basophils Absolute 0.0 0.0 - 0.1 K/uL   Immature Granulocytes 1 %   Abs Immature Granulocytes 0.08 (H) 0.00 - 0.07 K/uL    Comment: Performed at [facility] [address] [place], [place] [address]  Magnesium     Status: None   Collection Time: 03/17/19 11:54 PM  Result Value Ref Range   Magnesium 2.0 1.7 - 2.4 mg/dL    Comment: Performed at [facility], [place] [address] [place], [place] [address]  Phosphorus     Status: Abnormal   Collection Time: 03/17/19 11:54 PM  Result Value Ref Range   Phosphorus 2.3 (L) 2.5 - 4.6 mg/dL    Comment: Performed at [facility] [address], [place],  [address]  CK total and CKMB (cardiac)not at [facility]     Status: None   Collection Time: 03/17/19 11:54 PM  Result Value Ref Range   Total CK 65 38 - 234 U/L  CK, MB 3.3 0.5 - 5.0 ng/mL   Relative Index RELATIVE INDEX IS INVALID 0.0 - 2.5    Comment: WHEN CK < 100 U/L        Performed at  [facility] [address], [place], [place] [address]   Glucose, capillary     Status: Abnormal   Collection Time: 03/18/19  3:41 AM  Result Value Ref Range   Glucose-Capillary 131 (H) 70 - 99 mg/dL  Glucose, capillary     Status: Abnormal   Collection Time: 03/18/19  7:29 AM  Result Value Ref Range   Glucose-Capillary 125 (H) 70 - 99 mg/dL  Glucose, capillary     Status: Abnormal   Collection Time: 03/18/19  8:51 AM  Result Value Ref Range   Glucose-Capillary 112 (H) 70 - 99 mg/dL  Glucose, capillary     Status: Abnormal   Collection Time: 03/18/19 12:00 PM  Result Value Ref Range   Glucose-Capillary 103 (H) 70 - 99 mg/dL  Glucose, capillary     Status: Abnormal   Collection Time: 03/18/19  3:51 PM  Result Value Ref Range   Glucose-Capillary 143 (H) 70 - 99 mg/dL  Glucose, capillary     Status: Abnormal   Collection Time: 03/18/19  9:30 PM  Result Value Ref Range   Glucose-Capillary 131 (H) 70 - 99 mg/dL  Glucose, capillary     Status: Abnormal   Collection Time: 03/19/19 12:28 AM  Result Value Ref Range   Glucose-Capillary 120 (H) 70 - 99 mg/dL  Glucose, capillary     Status: Abnormal   Collection Time: 03/19/19  3:36 AM  Result Value Ref Range   Glucose-Capillary 119 (H) 70 - 99 mg/dL  Magnesium     Status: None   Collection Time: 03/19/19  7:18 AM  Result Value Ref Range   Magnesium 2.1 1.7 - 2.4 mg/dL    Comment: Performed at [facility], [place] [address] [place], [place] [address]  Phosphorus     Status: None   Collection Time: 03/19/19  7:18 AM  Result Value Ref Range   Phosphorus 3.9 2.5 - 4.6 mg/dL    Comment: Performed at [facility] [name], [place], [place] [address]  Triglycerides     Status: None   Collection Time: 03/19/19  7:18 AM  Result Value Ref Range   Triglycerides 19 <150 mg/dL    Comment: Performed at [facility] [address], [place], [place] [address]  Glucose, capillary     Status: Abnormal    Collection Time: 03/19/19  7:43 AM  Result Value Ref Range   Glucose-Capillary 108 (H) 70 - 99 mg/dL    Recent Results (from the past 240 hour(s))  SARS CORONAVIRUS 2 (TAT 6-24 HRS) Nasopharyngeal Nasopharyngeal Swab     Status: Abnormal   Collection Time: 03/09/19  1:24 PM   Specimen: Nasopharyngeal Swab  Result Value Ref Range Status   SARS Coronavirus 2 POSITIVE (A) NEGATIVE Final    Comment: RESULT CALLED TO, READ BACK BY AND VERIFIED WITH: RN MAC BURNS AT 0059 ON 03/10/2019 BY MESSAN HOUEGNIFIO (NOTE) SARS-CoV-2 target nucleic acids are DETECTED. The SARS-CoV-2 RNA is generally detectable in upper and lower respiratory specimens during the acute phase of infection. Positive results are indicative of the presence of SARS-CoV-2 RNA. Clinical correlation with patient history and other diagnostic information is  necessary to determine patient infection status. Positive results do not rule out bacterial infection  or co-infection with other viruses.  The expected result is Negative. Fact Sheet for Patients: SugarRoll.be Fact Sheet for Healthcare Providers: https://www.woods-mathews.com/ This test is not yet approved or cleared by the [place] FDA and  has been authorized for detection and/or diagnosis of SARS-CoV-2 by FDA under an Emergency Use Authorization (EUA). This EUA will remain  in effect (meaning this test  can be used) for the duration of the COVID-19 declaration under Section 564(b)(1) of the Act, 21 U.S.C. section 360bbb-3(b)(1), unless the authorization is terminated or revoked sooner. Performed at [facility], [place] [address] [place], [place] [address]   Culture, blood (x 2)     Status: None   Collection Time: 03/09/19  3:17 PM   Specimen: BLOOD  Result Value Ref Range Status   Specimen Description BLOOD LEFT ANTECUBITAL  Final   Special Requests   Final    BOTTLES DRAWN AEROBIC AND ANAEROBIC Blood Culture  adequate volume   Culture   Final    NO GROWTH 5 DAYS Performed at [facility], [address] [place], [place] [address]    Report Status 03/14/2019 FINAL  Final  MRSA PCR Screening     Status: None   Collection Time: 03/16/19  3:00 AM   Specimen: Nasopharyngeal  Result Value Ref Range Status   MRSA by PCR NEGATIVE NEGATIVE Final    Comment:        The GeneXpert MRSA Assay (FDA approved for NASAL specimens only), is one component of a comprehensive MRSA colonization surveillance program. It is not intended to diagnose MRSA infection nor to guide or monitor treatment for MRSA infections. Performed at [facility], [place] [address], [place], [place] [address]   Culture, respiratory (non-expectorated)     Status: None (Preliminary result)   Collection Time: 03/16/19  8:53 AM   Specimen: Tracheal Aspirate; Respiratory  Result Value Ref Range Status   Specimen Description   Final    TRACHEAL ASPIRATE Performed at [facility] [address], [place], [place] [address]    Special Requests   Final    NONE Performed at [facility], [place] [address] [place], [place] [address]    Gram Stain   Final    ABUNDANT WBC PRESENT, PREDOMINANTLY PMN MODERATE SQUAMOUS EPITHELIAL CELLS PRESENT FEW GRAM POSITIVE COCCI FEW YEAST    Culture   Final    FEW Consistent with normal respiratory flora. Performed at [facility], [place] [address], [place], [place] [address]    Report Status PENDING  Incomplete    Lipid Panel Recent Labs    03/19/19 0718  TRIG 19    Studies/Results: Dg Chest Port 1 View  Result Date: 03/18/2019 CLINICAL DATA:  Intubation. EXAM: PORTABLE CHEST 1 VIEW COMPARISON:  03/17/2019.  03/15/2019. FINDINGS: Endotracheal tube and NG tube in stable position. Heart size stable. Low lung volumes. Unchanged mild left mid lung and bibasilar atelectasis/infiltrates. No pleural effusion or pneumothorax.  Degenerative changes and scoliosis thoracic spine. IMPRESSION: 1.  Lines and tubes stable position. 2. Lung volumes. Unchanged mild left mid lung and bibasilar atelectasis/infiltrates. Electronically Signed   By: Marcello Moores  Register   On: 03/18/2019 07:37    Medications:  Scheduled: . atorvastatin  20 mg Per Tube q1800  . chlorhexidine gluconate (MEDLINE KIT)  15 mL Mouth Rinse BID  . Chlorhexidine Gluconate Cloth  6 each Topical Daily  . clonazePAM  2 mg Per Tube TID  . dexamethasone  4 mg Intravenous Q24H  . docusate  100 mg Per Tube Daily  . enoxaparin (LOVENOX) injection  40 mg Subcutaneous Q12H  . famotidine  20 mg Per Tube BID  . feeding supplement (PRO-STAT SUGAR FREE 64)  30 mL Per Tube BID  . insulin aspart  0-15 Units Subcutaneous Q4H  . ipratropium-albuterol  3 mL Nebulization BID  . lactobacillus  1 g Per Tube Q breakfast  . levothyroxine  50 mcg Intravenous Daily  . mouth rinse  15 mL Mouth Rinse 10 times per day  . metoprolol tartrate  5 mg Intravenous Q6H  . sodium chloride flush  10-40 mL Intracatheter Q12H  . vitamin C  500 mg Per Tube Daily  . zinc sulfate  220 mg Per Tube Daily   Continuous: . sodium chloride Stopped (03/12/19 1210)  . sodium chloride 50 mL/hr at 03/19/19 0800  . feeding supplement (VITAL AF 1.2 CAL) 1,000 mL (03/18/19 1209)  . fentaNYL infusion INTRAVENOUS Stopped (03/19/19 0739)  . metronidazole 500 mg (03/19/19 0834)  . propofol (DIPRIVAN) infusion Stopped (03/19/19 0740)     Assessment:  58 year old female with past medical history of hypothyroidism, hypertension, developed catatonia while admitted with Covid 19 infection at [facility].  Patient was intubated for airway protection after she started receiving benzodiazepines for treatment of what was presumed to be catatonia. Per caregiver from her group home, at baseline she is communicative, very energetic and talkative person, ambulates on her own. 1. Off propofol at time of AM exam  today (12/8). Continues on Klonopin 2 mg TID. Exam findings localize as severe diffuse cerebral dysfunction. She continues to have increased tone - on today's exam (12/8), extensor tone increased at elbows, hips and knees. MRI on 12/2 showed no abnormality; images personally reviewed, with no abnormal enhancement, no subtle cortical signal changes and no restricted diffusion. Additionally, there is no atrophy or significant white matter disease.   2. DDx: Presentation unlikely to be due to serotonin syndrome.  She does not have hyperreflexia, hyperthermia or spontaneous clonus on exam. NMS also unlikely as she is also not on any neuroleptics. Also not withdrawing from a dopaminergic agent.  3. EEG on 12/3 showed no epileptiform activity, but was consistent with an encephalopathy given generalized triphasic waves. Nonconvulsive status unlikely given the above. Will repeat EEG today (12/8) 4. Patient has been on CNS coverage for possible meningitis. On IV ceftriaxone. Has completed 10 day course of vancomycin. 5. Catatonia continues to be the most likely syndromic diagnosis. 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 Stiff Person Syndrome is also on the DDx, but is unlikely given its rarity and more likely etiology above.   Recommendations: - Continue Klonopin 2 mg TID -- Continue empiric meningitic coverage for total of 10 days -- To evaluate the possibility of Stiff Person Syndrome, would obtain LP for oligoclonal bands andsend outanti-GAD antibodies. Also should obtain cell count with differential, protein, glucose, gram stain, bacterial and fungal cultures. Due to her rigidity, bedside LP is not reasonably likely to succeed. Will need fluoro-guided LP. Will need to be taken off Lovenox for 24 hours prior to LP. Will need to be on SCDs during this time period.  --Repeat EEG today  40 minutes spent in the neurological evaluation and management of this critically ill patient. Time  spent included image review.   Addendum: EEG report: This study is suggestive of moderate to severe diffuse encephalopathy, nonspecific etiology. No seizures or epileptiform discharges were seen  throughout the recording.   LOS: 8 days   _0  signed: Dr. Kerney Elbe 03/19/2019  8:59 AM

## 2019-03-19 NOTE — Plan of Care (Signed)
  Problem: Respiratory: Goal: Will maintain a patent airway Outcome: Progressing   Problem: Clinical Measurements: Goal: Respiratory complications will improve Outcome: Progressing Note: Pt weaned on ventilator all day 12/7. Pts need for ventilator is more for unresponsiveness than respiratory distress.   Problem: Nutrition: Goal: Adequate nutrition will be maintained Outcome: Progressing Note: Pt tolerating TF well.   Problem: Elimination: Goal: Will not experience complications related to bowel motility Outcome: Progressing Note: Last BM 12/7 Goal: Will not experience complications related to urinary retention Outcome: Progressing   Problem: Pain Managment: Goal: General experience of comfort will improve Outcome: Progressing   Problem: Skin Integrity: Goal: Risk for impaired skin integrity will decrease Outcome: Progressing   Problem: Activity: Goal: Risk for activity intolerance will decrease Outcome: Not Progressing Note: Pt minimally responsive and bedbound

## 2019-03-19 NOTE — Consult Note (Signed)
[facility] CM Inpatient Consult   03/19/2019  Rebecca Bruce June 03, 1960 [id number]  Patient screened for long length of stay and listed as a readmission in  [facility] in the [employer] ACO. Review of patient's medical record reveals patient is for ongoing assessment and medical work up needs.  Primary Care Provider is  Alba Cory, MD at [facility], this is a [facility] Embedded Practice and will provide the transition of care follow up.  Briefly reviewed progress notes for LLOS and barriers patient remains intubated and needs for Goals of Care decisions.  Patient has a legal guardian and from a group home.   Plan: Continue to follow progress and disposition to assess for post hospital care management needs and with inpatient [facility] team when and if appropriate.   Please place a [facility] Care Management consult as appropriate and for questions contact:   Charlesetta Shanks, RN BSN CCM [facility]  [phone] business mobile phone Toll free office [phone]  Fax number: [phone] [email or web address]

## 2019-03-19 NOTE — Procedures (Signed)
Patient Name: Rebecca Bruce  MRN: [id number]  Epilepsy Attending: Lora Havens  Referring Physician/Provider: Dr. Kerney Elbe Date: 03/19/2019 Duration: 24.24 minutes  Patient history: 58 year old Covid positive female with altered mental status.  EEG to evaluate for seizures.  Level of alertness: Awake/lethargic  AEDs during EEG study: Clonazepam, propofol  Technical aspects: This EEG study was done with scalp electrodes positioned according to the 10-20 International system of electrode placement. Electrical activity was acquired at a sampling rate of 500Hz  and reviewed with a high frequency filter of 70Hz  and a low frequency filter of 1Hz . EEG data were recorded continuously and digitally stored.   Description: EEG shows continuous generalized 2 to 3 Hz low voltage delta slowing.  No clear posterior dominant rhythm was seen.  Hyperventilation and photic stimulation were not performed.  Abnormality -Continuous slow, generalized  IMPRESSION: This study is suggestive of moderate to severe diffuse encephalopathy, nonspecific etiology. No seizures or epileptiform discharges were seen throughout the recording.  Mahrukh Seguin Barbra Sarks

## 2019-03-19 NOTE — Progress Notes (Signed)
Cortrak Tube Team Note:  Consult received to place a Cortrak feeding tube.   Due to high volume will be unable to place Cortrak feeding tube in this patient today. Patient has NGT to right nare with tube feeds infusing and patient is tolerating. Discussed with RN about not being able to place Cortrak feeding tube in patient today. Next date of service is Friday 12/11.   Jacklynn Barnacle, MS, RD, LDN Office: [phone] Pager: [phone] After Hours/Weekend Pager: [phone]

## 2019-03-19 NOTE — Progress Notes (Signed)
RT NOTES: Placed patient on pressure support 10/5. VTs less than 180. Placed patient back on full support at this time.

## 2019-03-19 NOTE — Progress Notes (Signed)
Dietary/Cortrak RN called this RN to notify that they will not be able to place Cortak today. Pt will remain on their list and they will place tube Friday 12/11.

## 2019-03-19 NOTE — Progress Notes (Signed)
EEG completed, results pending. 

## 2019-03-19 NOTE — Progress Notes (Addendum)
NAME:  Rebecca Bruce, MRN:  [id number], DOB:  Aug 16, 1960, LOS: 8 ADMISSION DATE:  03/11/2019, CONSULTATION DATE:  12/4 REFERRING MD:  Jerral Ralph, CHIEF COMPLAINT:  Inability to protect airway   Brief History    58 y/o female brought to [facility] on 11/28 in the setting of altered mental status from her group home.  She was found to have COVID pneumonia, treated with decadron and remdesivir then sent to [facility] on 11/30.  Here has had progressive increase in acute encephalopathy and has become less and less responsive.  Today PCCM was asked to see her because she reached the point that she was aspirating and not responding to pharyngeal/tracheal suctioning.  By report her exam findings have waxed and waned over the last three days but have not generally moved in the right direction.  Neurology saw her on 12/2 and apparently at that time she followed some simple commands, but she was non-verbal at the time with significant rigidity noted in her upper extremities.   At baseline lives in a group home, can bathe herself and have a conversation.  Has baseline anxiety and depression.    Past Medical History  Thyroid disease Hypertension GERD  Significant Hospital Events   11/28 admission [facility] 11/30 transfer to [facility] 12/04 intubation for inability to protect airway 12/05 add diprivan, fentanyl gtt - > Suctioned large plug by RT >> Peak pressure improved after this.  No respiratory effort with SBT.  Consults:  Neurology  Procedures:  12/4 ETT >   Significant Diagnostic Tests:  11/28 CT head > mucosal thickening in ethmoid air cells, study otherwise unremarkable  12/03 MRI Brain > normal MRI brain  12/03 EEG > mod to severe diffuse encephalopathy, non-specific etiology but could be due to toxic metabolic causes  Micro Data:  40/98 urine culture > multiple species 11/28 blood > negative 11/28 SARS COV 2 > positive 12/04 sputum > consistent with normal flora  Antimicrobials:  11/28 Vanc >  11/30, restart 12/2>  11/28 mero >  12/2 11/29 remdesivir > 12/3 12/02 ceftriaxone >  12/02 vancomycin >  12/04 flagyl >   Interim history/subjective:  Sedated with propofol and fentanyl poorly responsive on ventilator  Objective   Blood pressure 140/81, pulse 64, temperature 97.7 F (36.5 C), temperature source Oral, resp. rate (!) 24, height 5\' 2"  (1.575 m), weight 89.2 kg, SpO2 100 %.    Vent Mode: PRVC FiO2 (%):  [40 %] 40 % Set Rate:  [24 bmp] 24 bmp Vt Set:  [400 mL] 400 mL PEEP:  [5 cmH20] 5 cmH20 Pressure Support:  [10 cmH20] 10 cmH20 Plateau Pressure:  [13 cmH20-18 cmH20] 18 cmH20   Intake/Output Summary (Last 24 hours) at 03/19/2019 1191 Last data filed at 03/19/2019 0800 Gross per 24 hour  Intake 3087.78 ml  Output 2515 ml  Net 572.78 ml   Filed Weights   03/17/19 0500 03/18/19 0454 03/19/19 0422  Weight: 88.4 kg 89.5 kg 89.2 kg    Examination: General: Nonresponsive female currently on propofol and fentanyl drip HEENT: Endotracheal tube in place Neuro: Follows commands opens eyes to noxious stimuli CV: Heart sounds are regular PULM: Diminished throughout Vent pressure regulated volume control FIO2 40% PEEP 5 RATE 24 Not overbreathing ventilator VT 400  GI: soft, bsx4 active  GU: Amber urine Extremities: 2+ edema Skin: no rashes or lesions   Resolved Hospital Problem list     Assessment & Plan:    Acute hypoxic respiratory failure from COVID pneumonia.- resolved  but mental status makes extubation tricky Wean per protocol Currently on propofol drip.  Stop sedation Extubation will be difficult Gastric tube in place airborne precautions Completed remdesivir Going from discussion concerning reintubation if fails extubation   Persistent encephalopathy with catatonia- EEG, MRI benign, neurology following Appreciate neurology involvement Wean sedation Questionable need for LP   Underlying MR- resides in group home, able to perform most ADLs on  own Suspect she will not be able to return to nursing home and posterior previous level Evaluate daily  Hypothyroidism-  TSH WNL   Best practice:  Diet: tube feeding DVT prophylaxis: lovenox per protocol GI prophylaxis: famotidine Mobility: bed rest Code Status: full Disposition: ICU  Family: Updated family member Steward Drone at length 03/19/2019  App cct 35 min  Brett Canales Saya Mccoll ACNP Acute Care Nurse Practitioner [facility] Please consult [place] 03/19/2019, 8:40 AM

## 2019-03-20 ENCOUNTER — Inpatient Hospital Stay (HOSPITAL_COMMUNITY): Payer: Medicare Other

## 2019-03-20 LAB — GLUCOSE, CAPILLARY
Glucose-Capillary: 118 mg/dL — ABNORMAL HIGH (ref 70–99)
Glucose-Capillary: 90 mg/dL (ref 70–99)
Glucose-Capillary: 146 mg/dL — ABNORMAL HIGH (ref 70–99)
Glucose-Capillary: 160 mg/dL — ABNORMAL HIGH (ref 70–99)
Glucose-Capillary: 112 mg/dL — ABNORMAL HIGH (ref 70–99)
Glucose-Capillary: 116 mg/dL — ABNORMAL HIGH (ref 70–99)

## 2019-03-20 LAB — CSF CELL COUNT WITH DIFFERENTIAL
RBC Count, CSF: 850 /mm3 — ABNORMAL HIGH
Tube #: 1
WBC, CSF: 1 /mm3 (ref 0–5)

## 2019-03-20 LAB — BASIC METABOLIC PANEL
Anion gap: 9 (ref 5–15)
BUN: 17 mg/dL (ref 6–20)
CO2: 28 mmol/L (ref 22–32)
Calcium: 8.3 mg/dL — ABNORMAL LOW (ref 8.9–10.3)
Chloride: 104 mmol/L (ref 98–111)
Creatinine, Ser: 0.42 mg/dL — ABNORMAL LOW (ref 0.44–1.00)
GFR calc Af Amer: >60 mL/min (ref >60)
GFR calc non Af Amer: >60 mL/min (ref >60)
Glucose, Bld: 111 mg/dL — ABNORMAL HIGH (ref 70–99)
Potassium: 3.9 mmol/L (ref 3.5–5.1)
Sodium: 141 mmol/L (ref 135–145)

## 2019-03-20 LAB — CBC WITH DIFFERENTIAL/PLATELET
Abs Immature Granulocytes: 0.14 10*3/uL — ABNORMAL HIGH (ref 0.00–0.07)
Basophils Absolute: 0 10*3/uL (ref 0.0–0.1)
Basophils Relative: 0 %
Eosinophils Absolute: 0 10*3/uL (ref 0.0–0.5)
Eosinophils Relative: 0 %
HCT: 37.1 % (ref 36.0–46.0)
Hemoglobin: 11.8 g/dL — ABNORMAL LOW (ref 12.0–15.0)
Immature Granulocytes: 1 %
Lymphocytes Relative: 12 %
Lymphs Abs: 1.3 10*3/uL (ref 0.7–4.0)
MCH: 26.4 pg (ref 26.0–34.0)
MCHC: 31.8 g/dL (ref 30.0–36.0)
MCV: 83 fL (ref 80.0–100.0)
Monocytes Absolute: 1.3 10*3/uL — ABNORMAL HIGH (ref 0.1–1.0)
Monocytes Relative: 12 %
Neutro Abs: 8.3 10*3/uL — ABNORMAL HIGH (ref 1.7–7.7)
Neutrophils Relative %: 75 %
Platelets: 270 10*3/uL (ref 150–400)
RBC: 4.47 MIL/uL (ref 3.87–5.11)
RDW: 14.9 % (ref 11.5–15.5)
WBC: 11.1 10*3/uL — ABNORMAL HIGH (ref 4.0–10.5)
nRBC: 0 % (ref 0.0–0.2)

## 2019-03-20 LAB — GLUCOSE, CSF: Glucose, CSF: 74 mg/dL — ABNORMAL HIGH (ref 40–70)

## 2019-03-20 LAB — PHOSPHORUS: Phosphorus: 3.7 mg/dL (ref 2.5–4.6)

## 2019-03-20 LAB — TRIGLYCERIDES
Triglycerides: 43 mg/dL (ref <150)
Triglycerides: 53 mg/dL (ref <150)

## 2019-03-20 LAB — MAGNESIUM: Magnesium: 2 mg/dL (ref 1.7–2.4)

## 2019-03-20 LAB — PROTEIN, CSF: Total  Protein, CSF: 76 mg/dL — ABNORMAL HIGH (ref 15–45)

## 2019-03-20 IMAGING — DX DG ABD PORTABLE 1V
1 series · 1 of 1 positions shown · non-contrast
Comparison: None.

CLINICAL DATA: NG tube placement.

EXAM:
PORTABLE ABDOMEN - 1 VIEW

[abdomen]
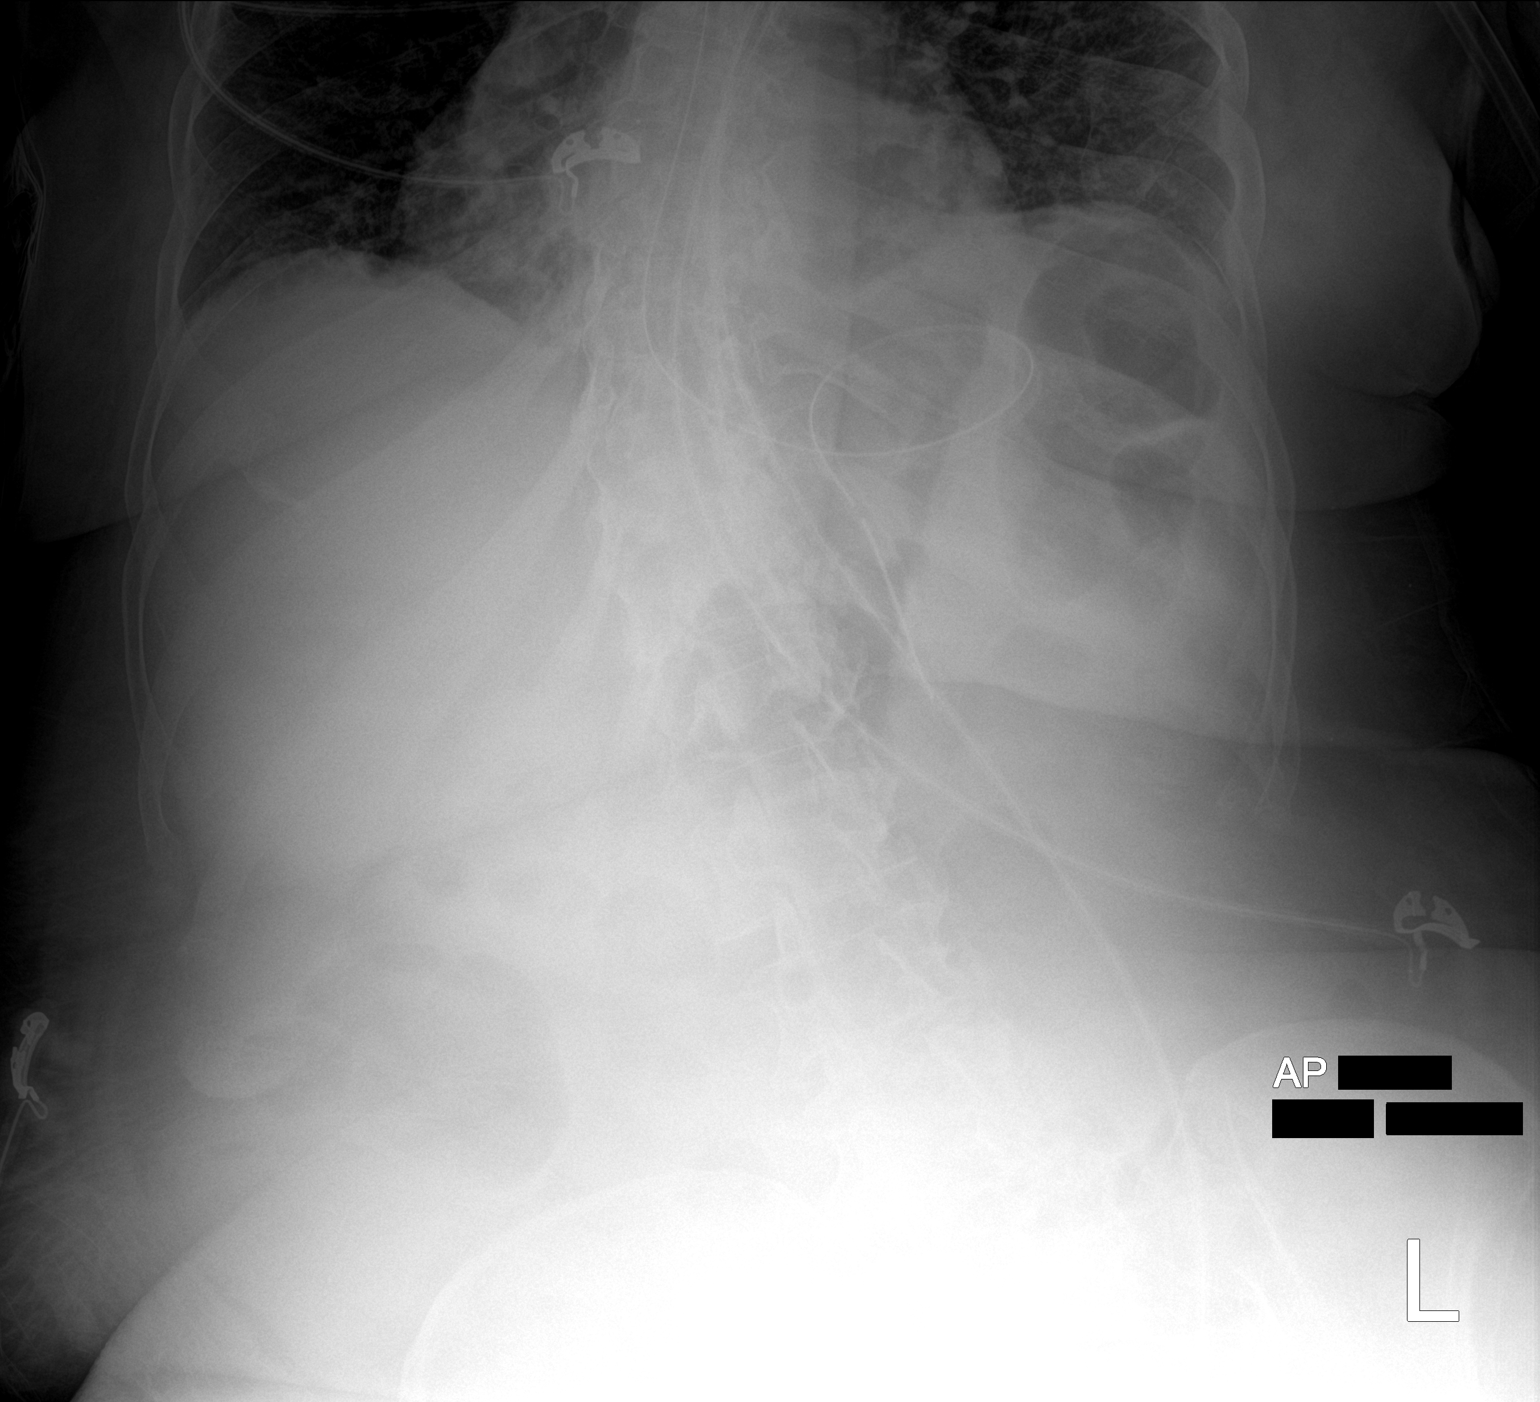

[1 of 1 positions shown; findings below may reference images not displayed]

FINDINGS: An enteric tube forms a loop in the region of the gastric fundus,
and its tip is in the region of the gastric body. No dilated loops
of bowel are seen to suggest obstruction. There is moderate
thoracolumbar dextroscoliosis. Bibasilar lung opacities were more
fully evaluated on today's earlier chest radiograph.
IMPRESSION: Enteric tube in the stomach.

## 2019-03-20 IMAGING — DX DG CHEST 1V PORT
1 series · 1 of 1 positions shown · non-contrast
Comparison: Radiograph [DATE]

CLINICAL DATA: Respiratory failure.

EXAM:
PORTABLE CHEST 1 VIEW

[chest]
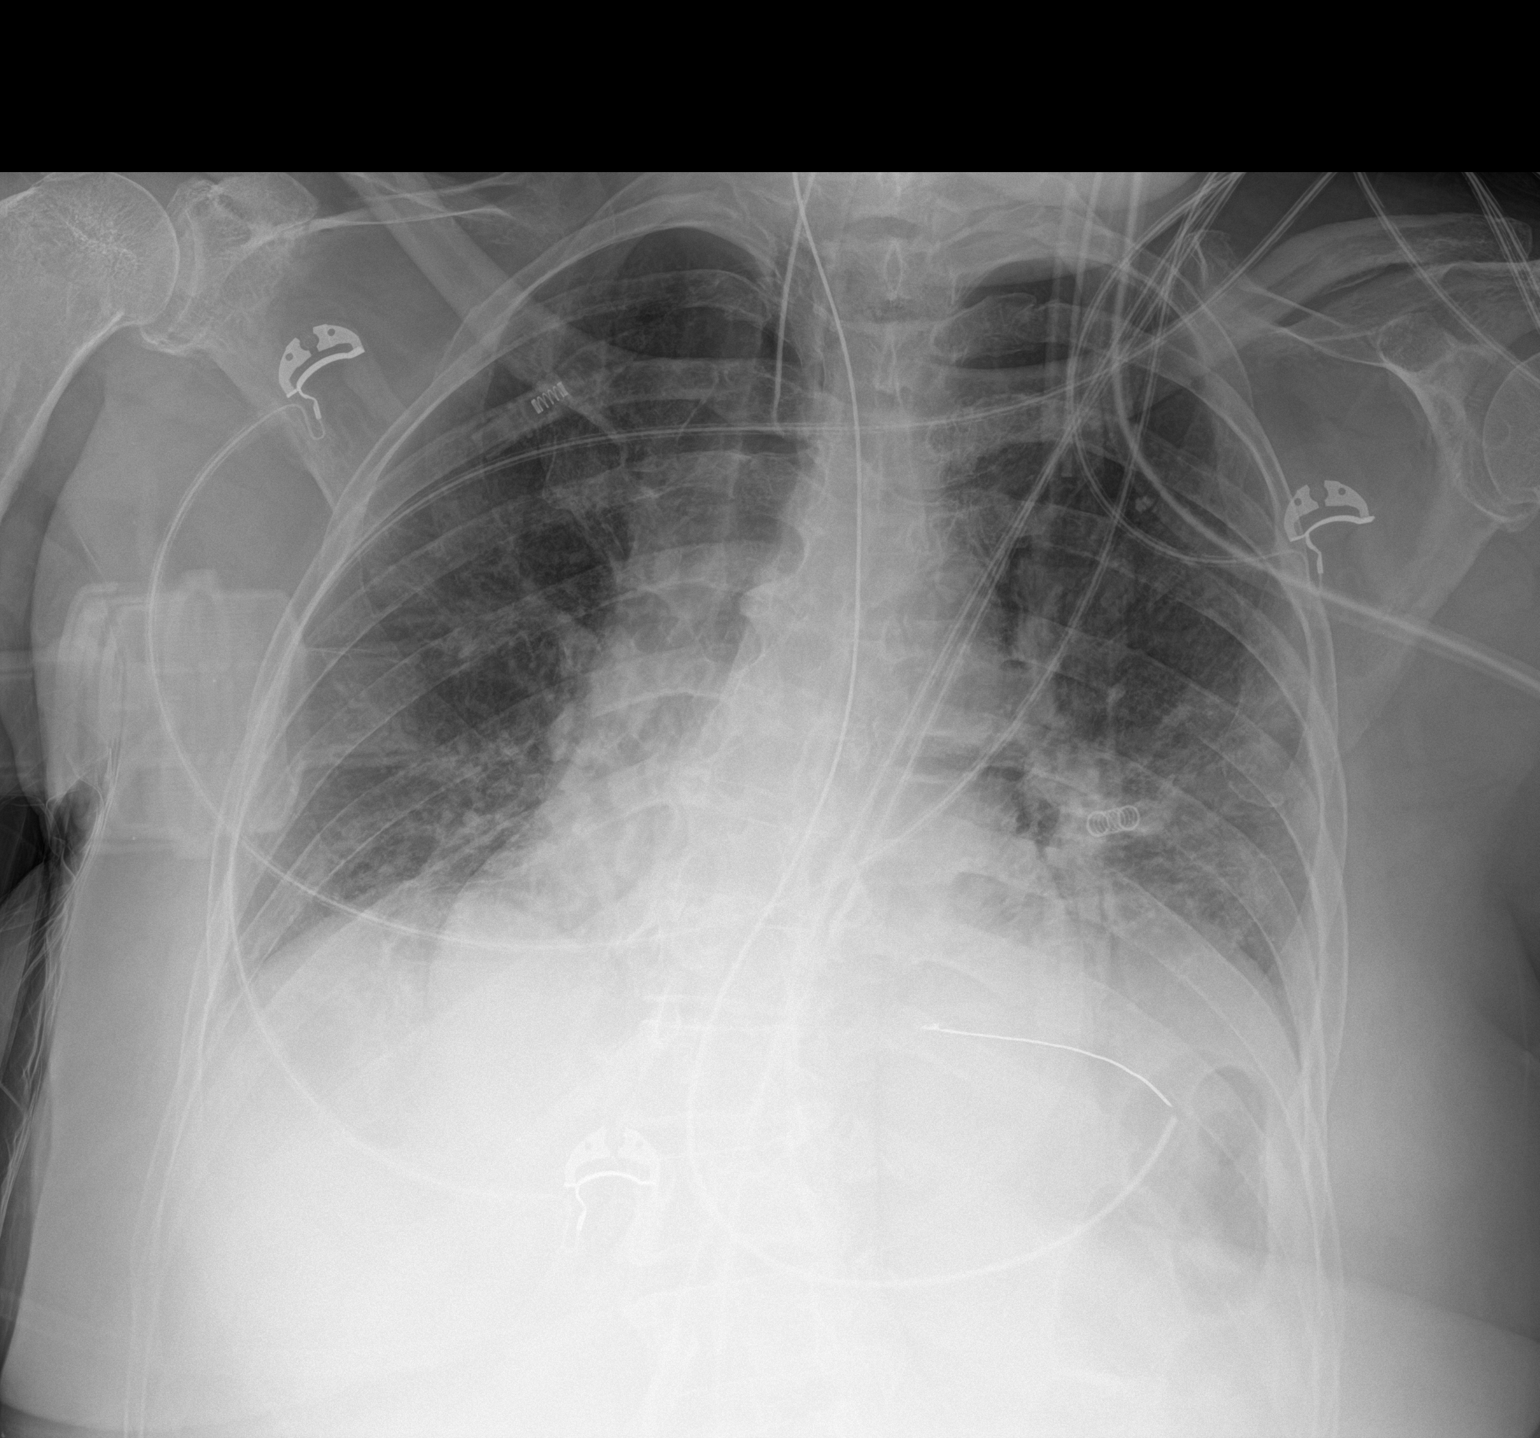

[1 of 1 positions shown; findings below may reference images not displayed]

FINDINGS: Endotracheal tube tip at the thoracic inlet. Enteric tube tip and
side-port below the diaphragm. Mild cardiomegaly with unchanged
mediastinal contours. Worsening opacity in the left mid lower lung
zone. Slight worsening opacity infrahilar right lung. No
pneumothorax.
IMPRESSION: 1. Worsening opacities in the left mid lower lung zone and
infrahilar right lung.
2. Endotracheal tube tip at the thoracic inlet. Enteric tube tip and
side-port below the diaphragm.

## 2019-03-20 IMAGING — RF DG SPINAL PUNCT LUMBAR DIAG WITH FL CT GUIDANCE
2 series · 3 of 3 positions shown · non-contrast
Comparison: none

CLINICAL DATA: Coma. COVID (+)

[Series 1: fluoro_iodine 2fps_bw · 0.17mm/px · 2 of 2 frames shown]
[frame 1/2]
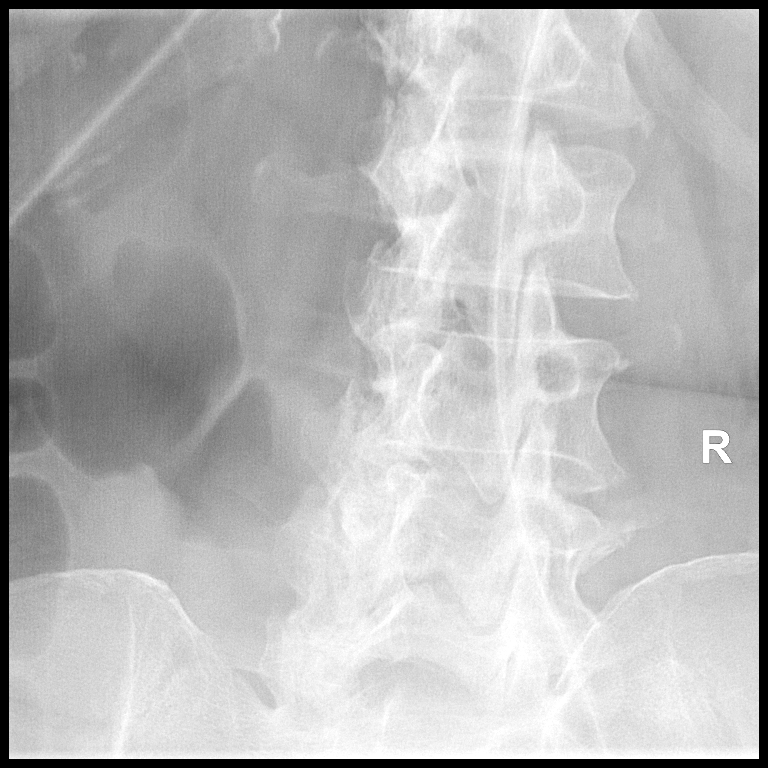
[frame 2/2]
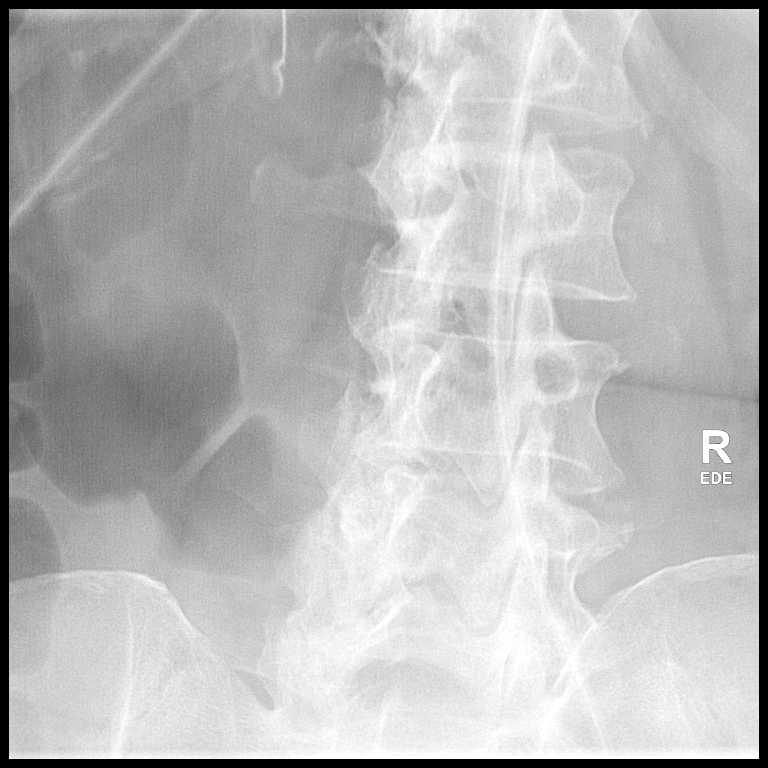

[Series 2: cp_standard · 0.17mm/px · 1 of 1 slices shown]
[im 1/1]
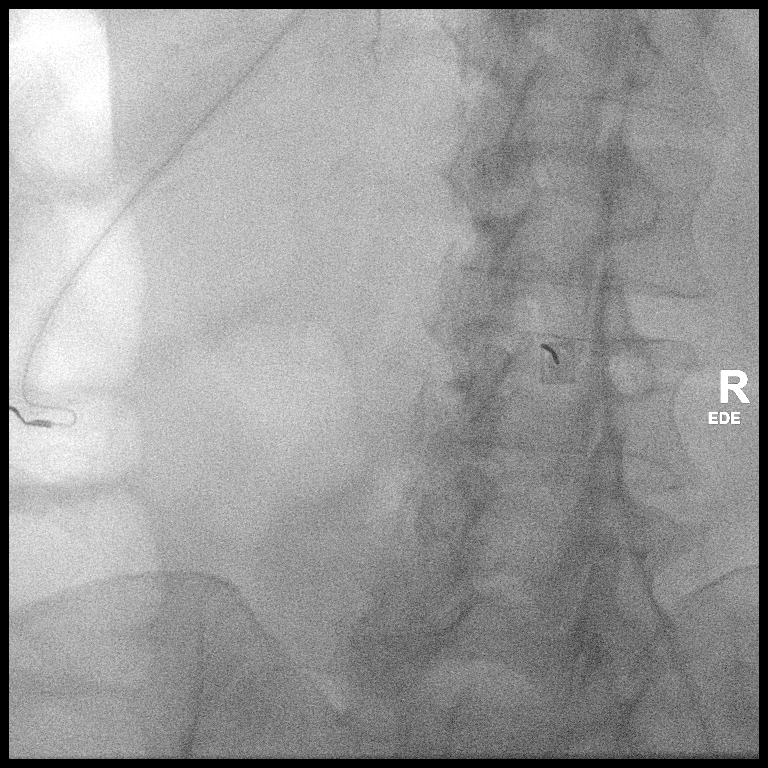

[3 of 3 positions shown; findings below may reference images not displayed]

EXAM:
DIAGNOSTIC LUMBAR PUNCTURE UNDER FLUOROSCOPIC GUIDANCE

FLUOROSCOPY TIME:  Fluoroscopy Time:  0.5 minutes

Radiation Exposure Index (if provided by the fluoroscopic device):
18.30 mGy

Number of Acquired Spot Images: 1

PROCEDURE:
Informed consent was obtained from the patient's sister via
telephone prior to the procedure, including potential complications
of headache, allergy, and pain. With the patient prone, the lower
back was prepped with Betadine. 1% Lidocaine was used for local
anesthesia. Lumbar puncture was performed at the L2-L3 level using a
20 gauge needle with return of clear CSF. 10.5 ml of CSF was
obtained for laboratory studies. The patient tolerated the procedure
well and there were no apparent complications.
IMPRESSION: Successful L2-L3 fluoroscopic-guided lumbar puncture.

10.5 mL of CSF was obtained for laboratory studies.

No immediate postprocedure complication.

## 2019-03-20 MED ORDER — LIDOCAINE HCL (PF) 1 % IJ SOLN
5.0000 mL | Freq: Once | INTRAMUSCULAR | Status: AC
  Administered 2019-03-20: 5 mL via INTRADERMAL

## 2019-03-20 NOTE — Procedures (Signed)
Lumbar puncture  Consent obtained from [facility] below Mar[facility] Doctor Sister [phone]   Patient positioned on side and area around L4/5 cleaned, draped, and numbed with 1% lidocaine.  Attempted LP x 4 without success. Will ask radiology to do under fluoro guidance.

## 2019-03-20 NOTE — Progress Notes (Signed)
NAME:  Rebecca Bruce, MRN:  [id number], DOB:  07-17-1960, LOS: 9 ADMISSION DATE:  03/11/2019, CONSULTATION DATE:  12/4 REFERRING MD:  Sloan Leiter, CHIEF COMPLAINT:  Inability to protect airway   Brief History    58 y/o female brought to [facility] on 11/28 in the setting of altered mental status from her group home.  She was found to have COVID pneumonia, treated with decadron and remdesivir then sent to [facility] on 11/30.  Here has had progressive increase in acute encephalopathy and has become less and less responsive.  Today PCCM was asked to see her because she reached the point that she was aspirating and not responding to pharyngeal/tracheal suctioning.  By report her exam findings have waxed and waned over the last three days but have not generally moved in the right direction.  Neurology saw her on 12/2 and apparently at that time she followed some simple commands, but she was non-verbal at the time with significant rigidity noted in her upper extremities.   At baseline lives in a group home, can bathe herself and have a conversation.  Has baseline anxiety and depression.    Past Medical History  Thyroid disease Hypertension GERD  Significant Hospital Events   11/28 admission [facility] 11/30 transfer to [facility] 12/04 intubation for inability to protect airway 12/05 add diprivan, fentanyl gtt - > Suctioned large plug by RT >> Peak pressure improved after this.  No respiratory effort with SBT.  Consults:  Neurology  Procedures:  12/4 ETT >   Significant Diagnostic Tests:  11/28 CT head > mucosal thickening in ethmoid air cells, study otherwise unremarkable  12/03 MRI Brain > normal MRI brain  12/03 EEG > mod to severe diffuse encephalopathy, non-specific etiology but could be due to toxic metabolic causes  Micro Data:  11/27 urine culture > multiple species 11/28 blood > negative 11/28 SARS COV 2 > positive 12/04 sputum > consistent with normal flora  Antimicrobials:  11/28 Vanc >  11/30, restart 12/2>  11/28 mero >  12/2 11/29 remdesivir > 12/3 12/02 ceftriaxone > discontinued 12/02 vancomycin > off 12/04 flagyl > off  Interim history/subjective:  Sedated with propofol and fentanyl poorly responsive on ventilator  Objective   Blood pressure 133/74, pulse 61, temperature 98 F (36.7 C), temperature source Oral, resp. rate (!) 24, height 5\' 2"  (1.575 m), weight 86.8 kg, SpO2 100 %.    Vent Mode: PRVC FiO2 (%):  [40 %] 40 % Set Rate:  [24 bmp] 24 bmp Vt Set:  [400 mL] 400 mL PEEP:  [5 cmH20] 5 cmH20 Plateau Pressure:  [11 cmH20-19 cmH20] 18 cmH20   Intake/Output Summary (Last 24 hours) at 03/20/2019 0930 Last data filed at 03/20/2019 0900 Gross per 24 hour  Intake 2610.05 ml  Output 2450 ml  Net 160.05 ml   Filed Weights   03/18/19 0454 03/19/19 0422 03/20/19 0324  Weight: 89.5 kg 89.2 kg 86.8 kg    Examination: General: 58 year old female HEENT: Endotracheal tube gastric tube in place Neuro: Currently sedated on mechanical ventilatory support heart CV: Heart sounds are distant PULM: Coarse rhonchi Vent PRVC mode FIO2 40% PEEP 5 RATE 24 without overbreathing VT 400    GI: soft, bsx4 active  Extremities: warm/dry, 2+ edema  Skin: no rashes or lesions    Resolved Hospital Problem list     Assessment & Plan:    Acute hypoxic respiratory failure from COVID pneumonia.- resolved but mental status makes extubation tricky Wean per protocol Does not meet  the criteria for spontaneous breathing trial Minimize sedation currently on propofol drip Currently on airborne precautions Completed remdesivir Decadron x10 days   Persistent encephalopathy with catatonia- EEG, MRI benign, neurology following Appreciate neurology involvement Wean sedation as tolerated LP per interventional radiology as ordered by neurology     Underlying MR- resides in group home, able to perform most ADLs on own Doubt she will return to skilled nursing facility  Evaluate daily  Hypothyroidism-  Continue Synthroid   Best practice:  Diet: tube feeding DVT prophylaxis: lovenox per protocol GI prophylaxis: famotidine Mobility: bed rest Code Status: full Disposition: ICU  Family: Updated family member [name] at length 03/19/2019  App cct 70 min  Richardson Landry  ACNP Acute Care Nurse Practitioner [place] Please consult [place] 03/20/2019, 9:30 AM

## 2019-03-20 NOTE — Procedures (Signed)
Successful L2-L3 fluoroscopic-guided lumbar puncture.  10.5 mL of CSF was obtained for laboratory studies.  No immediate postprocedure complication.

## 2019-03-21 ENCOUNTER — Inpatient Hospital Stay (HOSPITAL_COMMUNITY): Payer: Medicare Other

## 2019-03-21 ENCOUNTER — Other Ambulatory Visit: Payer: Self-pay | Admitting: Internal Medicine

## 2019-03-21 DIAGNOSIS — U071 COVID-19: Secondary | ICD-10-CM

## 2019-03-21 DIAGNOSIS — R403 Persistent vegetative state: Secondary | ICD-10-CM

## 2019-03-21 LAB — CBC WITH DIFFERENTIAL/PLATELET
Abs Immature Granulocytes: 0.11 10*3/uL — ABNORMAL HIGH (ref 0.00–0.07)
Basophils Absolute: 0 10*3/uL (ref 0.0–0.1)
Basophils Relative: 0 %
Eosinophils Absolute: 0 10*3/uL (ref 0.0–0.5)
Eosinophils Relative: 0 %
HCT: 40.5 % (ref 36.0–46.0)
Hemoglobin: 12.6 g/dL (ref 12.0–15.0)
Immature Granulocytes: 1 %
Lymphocytes Relative: 17 %
Lymphs Abs: 1.8 10*3/uL (ref 0.7–4.0)
MCH: 26.4 pg (ref 26.0–34.0)
MCHC: 31.1 g/dL (ref 30.0–36.0)
MCV: 84.9 fL (ref 80.0–100.0)
Monocytes Absolute: 1.4 10*3/uL — ABNORMAL HIGH (ref 0.1–1.0)
Monocytes Relative: 13 %
Neutro Abs: 7.2 10*3/uL (ref 1.7–7.7)
Neutrophils Relative %: 69 %
Platelets: 250 10*3/uL (ref 150–400)
RBC: 4.77 MIL/uL (ref 3.87–5.11)
RDW: 15.5 % (ref 11.5–15.5)
WBC: 10.5 10*3/uL (ref 4.0–10.5)
nRBC: 0 % (ref 0.0–0.2)

## 2019-03-21 LAB — TRIGLYCERIDES: Triglycerides: 49 mg/dL (ref <150)

## 2019-03-21 LAB — BASIC METABOLIC PANEL
Anion gap: 6 (ref 5–15)
BUN: 19 mg/dL (ref 6–20)
CO2: 30 mmol/L (ref 22–32)
Calcium: 8.4 mg/dL — ABNORMAL LOW (ref 8.9–10.3)
Chloride: 103 mmol/L (ref 98–111)
Creatinine, Ser: 0.44 mg/dL (ref 0.44–1.00)
GFR calc Af Amer: >60 mL/min (ref >60)
GFR calc non Af Amer: >60 mL/min (ref >60)
Glucose, Bld: 112 mg/dL — ABNORMAL HIGH (ref 70–99)
Potassium: 5.3 mmol/L — ABNORMAL HIGH (ref 3.5–5.1)
Sodium: 139 mmol/L (ref 135–145)

## 2019-03-21 LAB — MAGNESIUM: Magnesium: 2.3 mg/dL (ref 1.7–2.4)

## 2019-03-21 LAB — GLUCOSE, CAPILLARY
Glucose-Capillary: 96 mg/dL (ref 70–99)
Glucose-Capillary: 116 mg/dL — ABNORMAL HIGH (ref 70–99)
Glucose-Capillary: 148 mg/dL — ABNORMAL HIGH (ref 70–99)
Glucose-Capillary: 155 mg/dL — ABNORMAL HIGH (ref 70–99)
Glucose-Capillary: 145 mg/dL — ABNORMAL HIGH (ref 70–99)

## 2019-03-21 LAB — PHOSPHORUS: Phosphorus: 4.5 mg/dL (ref 2.5–4.6)

## 2019-03-21 IMAGING — DX DG CHEST 1V PORT
1 series · 1 of 1 positions shown · non-contrast
Comparison: [DATE]

CLINICAL DATA: Respiratory failure.

EXAM:
PORTABLE CHEST 1 VIEW

[chest]
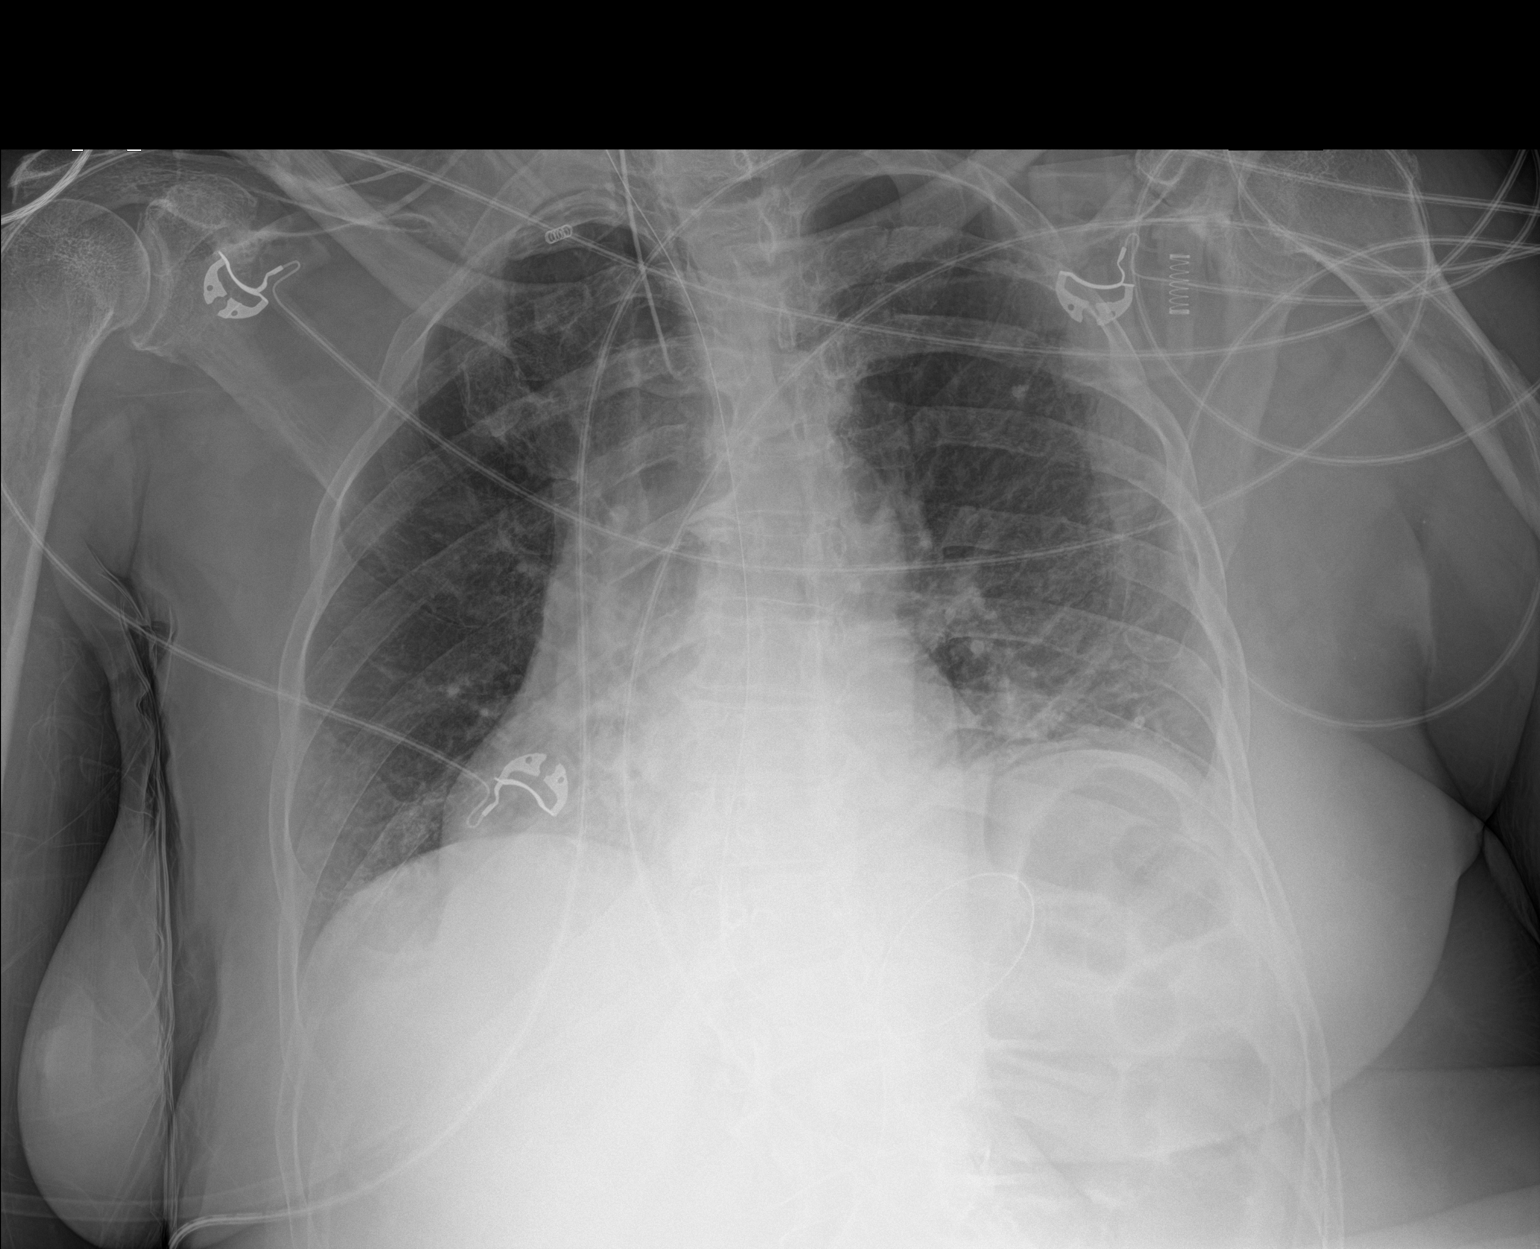

[1 of 1 positions shown; findings below may reference images not displayed]

FINDINGS: Patient is rotated to the right. Endotracheal tube has tip 5.2 cm
above the carina. Nasogastric tube is looped once over the stomach
as tip is not clearly visualized.

Lungs are adequately inflated with minimal left base opacification
unchanged which may be due to atelectasis or infection. No definite
effusion. Calcified granuloma over the left upper lobe.
Cardiomediastinal silhouette and remainder of the exam is unchanged.
IMPRESSION: Minimal stable left base opacification likely atelectasis, although
infection is possible.

Tubes and lines as described.

## 2019-03-21 MED ORDER — CLONAZEPAM 1 MG PO TABS: 1.0000 mg | ORAL_TABLET | Freq: Three times a day (TID) | ORAL | Status: DC

## 2019-03-21 MED ORDER — CLONAZEPAM 0.5 MG PO TABS
0.5000 mg | ORAL_TABLET | Freq: Three times a day (TID) | ORAL | Status: DC
  Administered 2019-03-21 (×3): 0.5 mg
  Filled 2019-03-21 (×3): qty 1

## 2019-03-21 NOTE — Progress Notes (Signed)
RT note: patient placed on CPAP/PSV of 12/5 at 0800.  Currently tolerating well.  Will continue to monitor.

## 2019-03-21 NOTE — Progress Notes (Signed)
Nutrition Follow-up  RD working remotely.  DOCUMENTATION CODES:   Obesity unspecified  INTERVENTION:   - Switch NG tube to Cortrak tomorrow, 12/11  Tube feeding: - Continue Vital AF 1.2 @ 50 ml/hr via NG tube - Pro-stat 30 ml BID per tube  Tube feeding regimen provides 1640 kcal, 120 grams of protein, and 973 ml of H2O.   NUTRITION DIAGNOSIS:   Increased nutrient needs related to acute illness (COVID PNA) as evidenced by estimated needs.  Ongoing, being addressed via TF  GOAL:   Patient will meet greater than or equal to 90% of their needs  Met via TF  MONITOR:   Vent status, Labs, Weight trends, TF tolerance, I & O's  REASON FOR ASSESSMENT:   (Cortrak request)    ASSESSMENT:   Pt with PMH of hypothyroidism, HTN, intellectual disability who lives in a group home. Pt admitted with AMS, poor oral intake, and COVID-19 PNA.  12/09 - s/p LP  Per CCM note, "currently on pressure support ventilation but decreased level consciousness precludes extubation unless one-way."  Pt with NG tube in place, TF infusing. Noted plan to switch to Cortrak tomorrow, 12/11. Cortrak team consult ordered.  EDW: 85 kg (admit weight)  Per RN edema assessment, pt with moderate pitting generalized edema and moderate pitting edema to BUE and BLE.  Current TF: Vital AF 1.2 @ 50 ml/hr, Pro-stat 30 ml BID  Patient is currently intubated on ventilator support MV: 7 L/min Temp (24hrs), Avg:97.9 F (36.6 C), Min:97.6 F (36.4 C), Max:98.2 F (36.8 C)  Drips: Fentanyl: OFF this AM NS: 50 ml/hr  Medications reviewed and include: colace, Pepcid, SSI, lactobacillus, vitamin C, zinc sulfate  Labs reviewed: potassium 5.3 CBG's: 96-160 x 24 hours  UOP: 2925 ml x 24 hours I/O's: +10.0 L since admit  NUTRITION - FOCUSED PHYSICAL EXAM:  Unable to complete at this time. RD working remotely.  Diet Order:   Diet Order    None      EDUCATION NEEDS:   No education needs have been  identified at this time  Skin:  Skin Assessment: Skin Integrity Issues: Other: MASD to perineum, groin  Last BM:  03/20/19 rectal tube  Height:   Ht Readings from Last 1 Encounters:  03/12/19 '5\' 2"'$  (1.575 m)    Weight:   Wt Readings from Last 1 Encounters:  03/21/19 88.3 kg    Ideal Body Weight:  50 kg  BMI:  Body mass index is 35.6 kg/m.  Estimated Nutritional Needs:   Kcal:  1558 kcal  Protein:  110-130 grams  Fluid:  >/= 1.6 L.day    Gaynell Face, MS, RD, LDN Inpatient Clinical Dietitian Pager: [phone] Weekend/After Hours: [phone]

## 2019-03-21 NOTE — H&P (Signed)
HISTORY AND PHYSICAL       PATIENT DETAILS Name: Rebecca Bruce Age: 58 y.o. Sex: female Date of Birth: 1960-11-22 Admit Date: 03/11/2019 [id number], [name], MD   Patient coming from: Transfer from [place]:  Altered mental status  HPI: Patient is a 58 y.o. female with PMHx of hypothyroidism, HTN, intellectual disability-lives in a group home for the past 20 years (normally fairly independent and talks)-brought in to [facility] on 11/28 for altered mental status, poor oral intake-further evaluation revealed COVID-19 pneumonia.  Patient was subsequently admitted to the hospitalist service at [facility] on steroids and remdesivir.  Patient was thought to have encephalopathy secondary to Covid 19-CT head x2 was negative at [facility].  She was subsequently transferred to [facility] on 11/30 for further ongoing care.   REVIEW OF SYSTEMS:  Unable to be performed given mental status   ALLERGIES:  No Known Allergies  PAST MEDICAL HISTORY: Past Medical History:  Diagnosis Date  . GERD (gastroesophageal reflux disease)   . Hypertension   . Thyroid disease     PAST SURGICAL HISTORY: Past Surgical History:  Procedure Laterality Date  . DENTAL SURGERY      MEDICATIONS AT HOME: Prior to Admission medications   Medication Sig Start Date End Date Taking? Authorizing Provider  ascorbic acid (VITAMIN C) 500 MG/5ML syrup Take 5 mLs (500 mg total) by mouth daily. 03/11/19   Max Sane, MD  atorvastatin (LIPITOR) 20 MG tablet TAKE 1 TABLET BY MOUTH AT BEDTIME FOR CHOLESTEROL Patient taking differently: Take 20 mg by mouth at bedtime.  11/01/18   Steele Sizer, MD  dexamethasone (DECADRON) 10 MG/ML injection Inject 0.6 mLs (6 mg total) into the vein daily. 03/11/19   Max Sane, MD  gabapentin (NEURONTIN) 300 MG capsule Take 1 capsule (300 mg total) by mouth at bedtime. 02/05/19   Steele Sizer, MD  levothyroxine (SYNTHROID) 100 MCG tablet Take 1 tablet (100 mcg  total) by mouth daily before breakfast. 02/05/19   Ancil Boozer, [name], MD  LORazepam (ATIVAN) 0.5 MG tablet Take 0.5 mg by mouth daily as needed for anxiety. Can take an extra pill 30 minutes after the first dose if needed    Agapito Games, MD  meropenem 1 g in sodium chloride 0.9 % 100 mL Inject 1 g into the vein every 8 (eight) hours. 03/11/19   Max Sane, MD  mupirocin ointment (BACTROBAN) 2 % Apply 1 application topically 2 (two) times daily as needed (for itchy bumps).  12/20/16   [provider]  PARoxetine (PAXIL) 30 MG tablet TAKE 1 TABLET BY MOUTH AT BEDTIME FOR DEPRESSION Patient taking differently: Take 30 mg by mouth at bedtime.  10/03/18   Steele Sizer, MD  saccharomyces boulardii (FLORASTOR) 250 MG capsule Take 250 mg by mouth daily.    [provider]  triamcinolone cream (KENALOG) 0.1 % Apply 1 application topically 2 (two) times daily. For two weeks and stop, if no improvement return for follow up Patient taking differently: Apply 1 application topically 2 (two) times daily as needed (for itchy bumps).  03/13/17   Steele Sizer, MD  triamterene-hydrochlorothiazide ([id number]) 37.5-25 MG tablet TAKE 1/2 TABLET BY MOUTH EVERY DAY Patient taking differently: Take 0.5 tablets by mouth daily.  11/01/18   Steele Sizer, MD  vancomycin 1,750 mg in sodium chloride 0.9 % 500 mL Inject 1,750 mg into the vein daily. 03/11/19   Max Sane, MD  VIACTIV CALCIUM PLUS D 650-12.5-40 MG-MCG CHEW  CHEW AND SWALLOW ONE PIECE TWICE DAILY. (SUPPLEMENT) CARAMEL Patient taking differently: Chew 1 Dose by mouth 2 (two) times daily.  11/01/18   Steele Sizer, MD  zinc sulfate 220 (50 Zn) MG capsule Take 1 capsule (220 mg total) by mouth daily. 03/12/19   Max Sane, MD    FAMILY HISTORY: Family History  Problem Relation Age of Onset  . Heart disease Mother   . Breast cancer Neg Hx      SOCIAL HISTORY:  reports that she has never smoked. She has never used smokeless tobacco.  She reports that she does not drink alcohol or use drugs.  PHYSICAL EXAM: Blood pressure 133/85, pulse 80, temperature 98.2 F (36.8 C), temperature source Oral, resp. rate 11, height 5\' 2"  (1.575 m), weight 88.3 kg, SpO2 100 %.  General appearance : Not in any distress but confused HEENT: Atraumatic and Normocephalic Neck: supple, no JVD.  Resp:Good air entry bilaterally, no added sounds  CVS: S1 S2 regular, no murmurs.  GI: Bowel sounds present, Non tender and not distended with no gaurding, rigidity or rebound. Extremities: B/L Lower Ext shows no edema, both legs are warm to touch Neurology: Difficult exam-but appears to be moving all 4 extremities. Musculoskeletal:gait appears to be normal.No digital cyanosis Skin:No Rash, warm and dry Wounds:N/A  LABS ON ADMISSION:  I have personally reviewed following labs and imaging studies  CBC: Recent Labs  Lab 03/16/19 0440 03/17/19 0458 03/17/19 2354 03/19/19 1027 03/20/19 0132 03/21/19 0613  WBC 10.5 10.2 8.5 9.8 11.1* 10.5  NEUTROABS 8.9*  --  6.7 6.5 8.3* 7.2  HGB 13.6 13.1 12.5 13.8 11.8* 12.6  HCT 43.5 41.5 38.8 42.7 37.1 40.5  MCV 83.3 83.7 82.6 84.4 83.0 84.9  PLT 322 250 246 270 270 [id number]    Basic Metabolic Panel: Recent Labs  Lab 03/16/19 0440 03/17/19 0458 03/17/19 2354 03/19/19 0718 03/20/19 0132 03/21/19 0613  NA 141 141 141  --  141 139  K 3.3* 4.0 4.1  --  3.9 5.3*  CL 107 111 107  --  104 103  CO2 26 21* 24  --  28 30  GLUCOSE 126* 129* 124*  --  111* 112*  BUN 21* 16 12  --  17 19  CREATININE 0.59 0.39* 0.43*  --  0.42* 0.44  CALCIUM 8.0* 7.9* 7.8*  --  8.3* 8.4*  MG 2.1 2.2 2.0 2.1 2.0 2.3  PHOS 2.3* 2.6 2.3* 3.9 3.7 4.5    GFR: Estimated Creatinine Clearance: 80.1 mL/min (by C-G formula based on SCr of 0.44 mg/dL).  Liver Function Tests: Recent Labs  Lab 03/15/19 0047 03/16/19 0440 03/17/19 1415  AST 39 32 51*  ALT 42 34 48*  ALKPHOS 59 53 51  BILITOT 1.0 0.6 0.5  PROT 6.6 5.7* 5.3*    ALBUMIN 3.0* 2.5* 2.3*   No results for input(s): LIPASE, AMYLASE in the last 168 hours. No results for input(s): AMMONIA in the last 168 hours.  Coagulation Profile: No results for input(s): INR, PROTIME in the last 168 hours.  Cardiac Enzymes: Recent Labs  Lab 03/17/19 2354  CKTOTAL 65  CKMB 3.3    BNP (last 3 results) No results for input(s): PROBNP in the last 8760 hours.  HbA1C: No results for input(s): HGBA1C in the last 72 hours.  CBG: Recent Labs  Lab 03/20/19 2030 03/20/19 2302 03/21/19 0319 03/21/19 0755 03/21/19 1230  GLUCAP 112* 116* 96 116* 148*    Lipid Profile: Recent Labs  03/20/19 1259 03/21/19 0613  TRIG 53 49    Thyroid Function Tests: No results for input(s): TSH, T4TOTAL, FREET4, T3FREE, THYROIDAB in the last 72 hours.  Anemia Panel: No results for input(s): VITAMINB12, FOLATE, FERRITIN, TIBC, IRON, RETICCTPCT in the last 72 hours.  Urine analysis:    Component Value Date/Time   COLORURINE YELLOW (A) 03/08/2019 1433   APPEARANCEUR CLOUDY (A) 03/08/2019 1433   LABSPEC 1.017 03/08/2019 1433   PHURINE 7.0 03/08/2019 1433   GLUCOSEU NEGATIVE 03/08/2019 1433   HGBUR SMALL (A) 03/08/2019 1433   BILIRUBINUR NEGATIVE 03/08/2019 1433   BILIRUBINUR neg 09/25/2018 1447   KETONESUR NEGATIVE 03/08/2019 1433   PROTEINUR 100 (A) 03/08/2019 1433   UROBILINOGEN 0.2 09/25/2018 1447   NITRITE NEGATIVE 03/08/2019 1433   LEUKOCYTESUR LARGE (A) 03/08/2019 1433    Sepsis Labs: Lactic Acid, Venous    Component Value Date/Time   LATICACIDVEN 1.9 03/17/2019 2354     Microbiology: Recent Results (from the past 240 hour(s))  MRSA PCR Screening     Status: None   Collection Time: 03/16/19  3:00 AM   Specimen: Nasopharyngeal  Result Value Ref Range Status   MRSA by PCR NEGATIVE NEGATIVE Final    Comment:        The GeneXpert MRSA Assay (FDA approved for NASAL specimens only), is one component of a comprehensive MRSA  colonization surveillance program. It is not intended to diagnose MRSA infection nor to guide or monitor treatment for MRSA infections. Performed at [facility], [place] [address], [place], [place] [address]   Culture, respiratory (non-expectorated)     Status: None   Collection Time: 03/16/19  8:53 AM   Specimen: Tracheal Aspirate; Respiratory  Result Value Ref Range Status   Specimen Description   Final    TRACHEAL ASPIRATE Performed at [place] [address], [place], [place] [address]    Special Requests   Final    NONE Performed at [facility], [place] [address], [place], [place] [address]    Gram Stain   Final    ABUNDANT WBC PRESENT, PREDOMINANTLY PMN MODERATE SQUAMOUS EPITHELIAL CELLS PRESENT FEW GRAM POSITIVE COCCI FEW YEAST    Culture   Final    FEW Consistent with normal respiratory flora. Performed at [facility], [address], [place], [place] [address]    Report Status 03/19/2019 FINAL  Final  CSF culture     Status: None (Preliminary result)   Collection Time: 03/20/19  3:11 PM   Specimen: PATH Cytology CSF; Cerebrospinal Fluid  Result Value Ref Range Status   Specimen Description CSF  Final   Special Requests NONE  Final   Gram Stain   Final    WBC PRESENT,BOTH PMN AND MONONUCLEAR NO ORGANISMS SEEN CYTOSPIN SMEAR    Culture   Final    NO GROWTH < 24 HOURS Performed at [facility], [place] [address] [place], [place] [address]    Report Status PENDING  Incomplete      RADIOLOGIC STUDIES ON ADMISSION: EEG  Result Date: 03/19/2019 Lora Havens, MD     03/19/2019  2:48 PM Patient Name: Rebecca Bruce MRN: [id number] Epilepsy Attending: Lora Havens Referring Physician/Provider: Dr. Kerney Elbe Date: 03/19/2019 Duration: 24.24 minutes Patient history: 58 year old Covid positive female with altered mental status.  EEG to evaluate for seizures. Level of alertness:  Awake/lethargic AEDs during EEG study: Clonazepam, propofol Technical aspects: This EEG study was done with  scalp electrodes positioned according to the 10-20 International system of electrode placement. Electrical activity was acquired at a sampling rate of 500Hz  and reviewed with a high frequency filter of 70Hz  and a low frequency filter of 1Hz . EEG data were recorded continuously and digitally stored. Description: EEG shows continuous generalized 2 to 3 Hz low voltage delta slowing.  No clear posterior dominant rhythm was seen.  Hyperventilation and photic stimulation were not performed. Abnormality -Continuous slow, generalized IMPRESSION: This study is suggestive of moderate to severe diffuse encephalopathy, nonspecific etiology. No seizures or epileptiform discharges were seen throughout the recording. Lora Havens   AM DG Chest Port 1 View  Result Date: 03/21/2019 CLINICAL DATA:  Respiratory failure. EXAM: PORTABLE CHEST 1 VIEW COMPARISON:  03/20/2019 FINDINGS: Patient is rotated to the right. Endotracheal tube has tip 5.2 cm above the carina. Nasogastric tube is looped once over the stomach as tip is not clearly visualized. Lungs are adequately inflated with minimal left base opacification unchanged which may be due to atelectasis or infection. No definite effusion. Calcified granuloma over the left upper lobe. Cardiomediastinal silhouette and remainder of the exam is unchanged. IMPRESSION: Minimal stable left base opacification likely atelectasis, although infection is possible. Tubes and lines as described. Electronically Signed   By: Marin Olp M.D.   On: 03/21/2019 07:30   AM DG Chest Port 1 View  Result Date: 03/20/2019 CLINICAL DATA:  Respiratory failure. EXAM: PORTABLE CHEST 1 VIEW COMPARISON:  Radiograph 03/18/2019 FINDINGS: Endotracheal tube tip at the thoracic inlet. Enteric tube tip and side-port below the diaphragm. Mild cardiomegaly with unchanged mediastinal contours. Worsening  opacity in the left mid lower lung zone. Slight worsening opacity infrahilar right lung. No pneumothorax. IMPRESSION: 1. Worsening opacities in the left mid lower lung zone and infrahilar right lung. 2. Endotracheal tube tip at the thoracic inlet. Enteric tube tip and side-port below the diaphragm. Electronically Signed   By: Keith Rake M.D.   On: 03/20/2019 06:16   DG Abd Portable 1V  Result Date: 03/20/2019 CLINICAL DATA:  NG tube placement. EXAM: PORTABLE ABDOMEN - 1 VIEW COMPARISON:  None. FINDINGS: An enteric tube forms a loop in the region of the gastric fundus, and its tip is in the region of the gastric body. No dilated loops of bowel are seen to suggest obstruction. There is moderate thoracolumbar dextroscoliosis. Bibasilar lung opacities were more fully evaluated on today's earlier chest radiograph. IMPRESSION: Enteric tube in the stomach. Electronically Signed   By: Logan Bores M.D.   On: 03/20/2019 11:13   DG FL GUIDED LUMBAR PUNCTURE  Result Date: 03/20/2019 CLINICAL DATA:  Coma. COVID (+) EXAM: DIAGNOSTIC LUMBAR PUNCTURE UNDER FLUOROSCOPIC GUIDANCE FLUOROSCOPY TIME:  Fluoroscopy Time:  0.5 minutes Radiation Exposure Index (if provided by the fluoroscopic device): 18.30 mGy Number of Acquired Spot Images: 1 PROCEDURE: Informed consent was obtained from the patient's sister via telephone prior to the procedure, including potential complications of headache, allergy, and pain. With the patient prone, the lower back was prepped with Betadine. 1% Lidocaine was used for local anesthesia. Lumbar puncture was performed at the L2-L3 level using a 20 gauge needle with return of clear CSF. 10.5 ml of CSF was obtained for laboratory studies. The patient tolerated the procedure well and there were no apparent complications. IMPRESSION: Successful L2-L3 fluoroscopic-guided lumbar puncture. 10.5 mL of CSF was obtained for laboratory studies. No immediate postprocedure complication. Electronically  Signed   By: Kellie Simmering DO   On: 03/20/2019 14:50  I have personally reviewed images of chest xray+ for pneumonia.   ASSESSMENT AND PLAN: COVID-19 pneumonia: Does not appear to be hypoxic-plans are to continue steroids, remdesivir-follow clinical course and track inflammatory markers.  Acute metabolic encephalopathy superimposed on intellectual disability: Very confused-unable to obtain any further history.  Seems to be moving all 4 extremities.  CT head x2 - at [facility] ED.  Per documentation-MRI was attempted but not successful.  Plan is to treat underlying Covid-to see if she improves-if not she will require further work-up with neurology evaluation.  Sepsis: Suspect secondary to COVID-19-present on admission.  Hypothyroidism: Continue levothyroxine  Further plan will depend as patient's clinical course evolves and further radiologic and laboratory data become available. Patient will be monitored closely.  CONSULTS: None   DVT Prophylaxis: Prophylactic Lovenox  Code Status: Full Code  Disposition Plan: Probably back to group home when she improves  Admission status:  Inpatient going to tele  The medical decision making on this patient was of high complexity and the patient is at high risk for clinical deterioration, therefore this is a level 3 visit.   Total time spent  55 minutes.Greater than 50% of this time was spent in counseling, explanation of diagnosis, planning of further management, and coordination of care.     Oren Binet [facility] Pager [phone]  If 7PM-7AM, please contact night-coverage  Please page via [email or web address]  Go to [email or web address] and use [place]'s universal password to access. If you do not have the password, please contact the hospital operator.  Locate the [facility] provider you are looking for under [facility] and page to a number that you can be directly reached. If you still have difficulty reaching the provider, please  page the [facility] (Director on Call) for the Hospitalists listed on amion for assistance.  03/21/2019, 2:22 PM

## 2019-03-21 NOTE — Progress Notes (Signed)
Subjective: Sedation stopped approximately 1 hour ago.  Objective: Current vital signs: BP 125/81   Pulse 71   Temp 98.1 F (36.7 C) (Axillary)   Resp 10   Ht _0  (1.575 m)   Wt 88.3 kg   SpO2 100%   BMI 35.60 kg/m  Vital signs in last 24 hours: Temp:  [97.6 F (36.4 C)-98.2 F (36.8 C)] 98.1 F (36.7 C) (12/10 0400) Pulse Rate:  [53-76] 71 (12/10 0830) Resp:  [10-24] 10 (12/10 0830) BP: (110-153)/(66-109) 125/81 (12/10 0830) SpO2:  [100 %] 100 % (12/10 0830) FiO2 (%):  [40 %] 40 % (12/10 0802) Weight:  [88.3 kg] 88.3 kg (12/10 0322)  Intake/Output from previous day: 12/09 0701 - 12/10 0700 In: 2794.2 [I.V.:1424.2; NG/GT:1370] Out: 2925 [Urine:2925] Intake/Output this shift: Total I/O In: 103 [I.V.:53; NG/GT:50] Out: 350 [Urine:350] Nutritional status:  Diet Order    None     HEENT: [place]/AT Lungs: Intubated  Ext: Warm and well perfused  Neurologic Exam: Ment: Eyes open spontaneously. Blinks intermittently, with higher blink rate than normal. Does not attend to any sensory stimuli. Does not follow commands. No attempts to communicate. Eyes conjugately deviated upwards. Will not gaze towards or away from visual stimuli.  CN: Pupils are equal. No blink to threat. Eyes deviated upwards. Weak doll's eye reflex noted. Blinks to eyelash stimulation. Face flaccidly symmetric with no grimace to brow ridge pressure.  Motor/Sensory: Increased extensor tone BUE, with flaccid flexor tone at elbows. Increased extensor tone BLE. No asymmetry. No withdrawal to noxious stimuli x 4.  Reflexes: Brisk low amplitude reflexes bilateral biceps and patellae. Toes tonically upgoing  Lab Results: Results for orders placed or performed during the hospital encounter of 03/11/19 (from the past 48 hour(s))  CBC with Differential/Platelet     Status: Abnormal   Collection Time: 03/19/19 10:27 AM  Result Value Ref Range   WBC 9.8 4.0 - 10.5 K/uL   RBC 5.06 3.87 - 5.11 MIL/uL   Hemoglobin  13.8 12.0 - 15.0 g/dL   HCT 42.7 36.0 - 46.0 %   MCV 84.4 80.0 - 100.0 fL   MCH 27.3 26.0 - 34.0 pg   MCHC 32.3 30.0 - 36.0 g/dL   RDW 15.2 11.5 - 15.5 %   Platelets 270 150 - 400 K/uL   nRBC 0.0 0.0 - 0.2 %   Neutrophils Relative % 66 %   Neutro Abs 6.5 1.7 - 7.7 K/uL   Lymphocytes Relative 16 %   Lymphs Abs 1.5 0.7 - 4.0 K/uL   Monocytes Relative 15 %   Monocytes Absolute 1.5 (H) 0.1 - 1.0 K/uL   Eosinophils Relative 1 %   Eosinophils Absolute 0.1 0.0 - 0.5 K/uL   Basophils Relative 0 %   Basophils Absolute 0.0 0.0 - 0.1 K/uL   Immature Granulocytes 2 %   Abs Immature Granulocytes 0.18 (H) 0.00 - 0.07 K/uL    Comment: Performed at [facility], [address], [place], [place] [address]  Glucose, capillary     Status: Abnormal   Collection Time: 03/19/19 11:40 AM  Result Value Ref Range   Glucose-Capillary 126 (H) 70 - 99 mg/dL  Glucose, capillary     Status: Abnormal   Collection Time: 03/19/19  4:08 PM  Result Value Ref Range   Glucose-Capillary 133 (H) 70 - 99 mg/dL  Glucose, capillary     Status: Abnormal   Collection Time: 03/19/19  8:13 PM  Result Value Ref Range   Glucose-Capillary  134 (H) 70 - 99 mg/dL  Glucose, capillary     Status: Abnormal   Collection Time: 03/19/19 11:30 PM  Result Value Ref Range   Glucose-Capillary 127 (H) 70 - 99 mg/dL  Triglycerides     Status: None   Collection Time: 03/20/19  1:32 AM  Result Value Ref Range   Triglycerides 43 <150 mg/dL    Comment: Performed at [facility] [address] [place], [place] [address]  CBC with Differential     Status: Abnormal   Collection Time: 03/20/19  1:32 AM  Result Value Ref Range   WBC 11.1 (H) 4.0 - 10.5 K/uL   RBC 4.47 3.87 - 5.11 MIL/uL   Hemoglobin 11.8 (L) 12.0 - 15.0 g/dL   HCT 37.1 36.0 - 46.0 %   MCV 83.0 80.0 - 100.0 fL   MCH 26.4 26.0 - 34.0 pg   MCHC 31.8 30.0 - 36.0 g/dL   RDW 14.9 11.5 - 15.5 %   Platelets 270 150 - 400 K/uL   nRBC 0.0 0.0 - 0.2 %    Neutrophils Relative % 75 %   Neutro Abs 8.3 (H) 1.7 - 7.7 K/uL   Lymphocytes Relative 12 %   Lymphs Abs 1.3 0.7 - 4.0 K/uL   Monocytes Relative 12 %   Monocytes Absolute 1.3 (H) 0.1 - 1.0 K/uL   Eosinophils Relative 0 %   Eosinophils Absolute 0.0 0.0 - 0.5 K/uL   Basophils Relative 0 %   Basophils Absolute 0.0 0.0 - 0.1 K/uL   Immature Granulocytes 1 %   Abs Immature Granulocytes 0.14 (H) 0.00 - 0.07 K/uL    Comment: Performed at [facility], [address] [place], [place] [address]  Magnesium     Status: None   Collection Time: 03/20/19  1:32 AM  Result Value Ref Range   Magnesium 2.0 1.7 - 2.4 mg/dL    Comment: Performed at [facility] [address], [place], [place] [address]  Phosphorus     Status: None   Collection Time: 03/20/19  1:32 AM  Result Value Ref Range   Phosphorus 3.7 2.5 - 4.6 mg/dL    Comment: Performed at [facility] [address], [place], [place] [address]  AM Basic metabolic panel     Status: Abnormal   Collection Time: 03/20/19  1:32 AM  Result Value Ref Range   Sodium 141 135 - 145 mmol/L   Potassium 3.9 3.5 - 5.1 mmol/L   Chloride 104 98 - 111 mmol/L   CO2 28 22 - 32 mmol/L   Glucose, Bld 111 (H) 70 - 99 mg/dL   BUN 17 6 - 20 mg/dL   Creatinine, Ser 0.42 (L) 0.44 - 1.00 mg/dL   Calcium 8.3 (L) 8.9 - 10.3 mg/dL   GFR calc non Af Amer >60 >60 mL/min   GFR calc Af Amer >60 >60 mL/min   Anion gap 9 5 - 15    Comment: Performed at [facility], [place] [address] [place], [place] [address]  Glucose, capillary     Status: Abnormal   Collection Time: 03/20/19  3:21 AM  Result Value Ref Range   Glucose-Capillary 118 (H) 70 - 99 mg/dL  Glucose, capillary     Status: None   Collection Time: 03/20/19  8:04 AM  Result Value Ref Range   Glucose-Capillary 90 70 - 99 mg/dL  Glucose, capillary     Status: Abnormal   Collection  Time: 03/20/19 12:37 PM  Result Value Ref Range   Glucose-Capillary 146 (H) 70 - 99 mg/dL   Triglycerides     Status: None   Collection Time: 03/20/19 12:59 PM  Result Value Ref Range   Triglycerides 53 <150 mg/dL    Comment: Performed at [facility] [address], [place], [place] [address]  Glucose, CSF     Status: Abnormal   Collection Time: 03/20/19  3:11 PM  Result Value Ref Range   Glucose, CSF 74 (H) 40 - 70 mg/dL    Comment: Performed at [facility] [address] [place], [place] [address]  Protein, CSF     Status: Abnormal   Collection Time: 03/20/19  3:11 PM  Result Value Ref Range   Total  Protein, CSF 76 (H) 15 - 45 mg/dL    Comment: Performed at [facility] [address], [place], [place] [address]  CSF cell count with differential     Status: Abnormal   Collection Time: 03/20/19  3:11 PM  Result Value Ref Range   Tube # 1    Color, CSF COLORLESS COLORLESS   Appearance, CSF CLEAR CLEAR   Supernatant NOT INDICATED    RBC Count, CSF 850 (H) 0 /cu mm   WBC, CSF 1 0 - 5 /cu mm   Other Cells, CSF TOO FEW TO COUNT, SMEAR AVAILABLE FOR REVIEW     Comment: FEW NEUTROPHILS,FEW LYMPHOCYTES AND RARE MONOCYTES NOTED Performed at [facility] [address] [place], [place] [address]   CSF culture     Status: None (Preliminary result)   Collection Time: 03/20/19  3:11 PM   Specimen: PATH Cytology CSF; Cerebrospinal Fluid  Result Value Ref Range   Specimen Description CSF    Special Requests NONE    Gram Stain      WBC PRESENT,BOTH PMN AND MONONUCLEAR NO ORGANISMS SEEN CYTOSPIN SMEAR Performed at [facility], [place] [address], [place], [place] [address]    Culture PENDING    Report Status PENDING   Glucose, capillary     Status: Abnormal   Collection Time: 03/20/19  4:13 PM  Result Value Ref Range   Glucose-Capillary 160 (H) 70 - 99 mg/dL  Glucose, capillary     Status: Abnormal   Collection Time: 03/20/19  8:30 PM  Result Value Ref Range   Glucose-Capillary 112 (H) 70 - 99 mg/dL  Glucose, capillary     Status:  Abnormal   Collection Time: 03/20/19 11:02 PM  Result Value Ref Range   Glucose-Capillary 116 (H) 70 - 99 mg/dL  Glucose, capillary     Status: None   Collection Time: 03/21/19  3:19 AM  Result Value Ref Range   Glucose-Capillary 96 70 - 99 mg/dL  Triglycerides     Status: None   Collection Time: 03/21/19  6:13 AM  Result Value Ref Range   Triglycerides 49 <150 mg/dL    Comment: SLIGHT HEMOLYSIS Performed at [facility], [place] [address], [place],  [address]   CBC with Differential     Status: Abnormal   Collection Time: 03/21/19  6:13 AM  Result Value Ref Range   WBC 10.5 4.0 - 10.5 K/uL   RBC 4.77 3.87 - 5.11 MIL/uL   Hemoglobin 12.6 12.0 - 15.0 g/dL   HCT 40.5 36.0 - 46.0 %   MCV 84.9 80.0 - 100.0 fL   MCH 26.4 26.0 - 34.0 pg  MCHC 31.1 30.0 - 36.0 g/dL   RDW 15.5 11.5 - 15.5 %   Platelets 250 150 - 400 K/uL   nRBC 0.0 0.0 - 0.2 %   Neutrophils Relative % 69 %   Neutro Abs 7.2 1.7 - 7.7 K/uL   Lymphocytes Relative 17 %   Lymphs Abs 1.8 0.7 - 4.0 K/uL   Monocytes Relative 13 %   Monocytes Absolute 1.4 (H) 0.1 - 1.0 K/uL   Eosinophils Relative 0 %   Eosinophils Absolute 0.0 0.0 - 0.5 K/uL   Basophils Relative 0 %   Basophils Absolute 0.0 0.0 - 0.1 K/uL   Immature Granulocytes 1 %   Abs Immature Granulocytes 0.11 (H) 0.00 - 0.07 K/uL    Comment: Performed at [facility] [address], [place], [place] [address]  Magnesium     Status: None   Collection Time: 03/21/19  6:13 AM  Result Value Ref Range   Magnesium 2.3 1.7 - 2.4 mg/dL    Comment: Performed at [facility] [address], [place], [place] [address]  Phosphorus     Status: None   Collection Time: 03/21/19  6:13 AM  Result Value Ref Range   Phosphorus 4.5 2.5 - 4.6 mg/dL    Comment: Performed at [facility] [address], [place], [place] [address]  AM Basic metabolic panel     Status: Abnormal   Collection Time: 03/21/19  6:13 AM  Result Value Ref Range   Sodium 139  135 - 145 mmol/L   Potassium 5.3 (H) 3.5 - 5.1 mmol/L    Comment: SLIGHT HEMOLYSIS   Chloride 103 98 - 111 mmol/L   CO2 30 22 - 32 mmol/L   Glucose, Bld 112 (H) 70 - 99 mg/dL   BUN 19 6 - 20 mg/dL   Creatinine, Ser 0.44 0.44 - 1.00 mg/dL   Calcium 8.4 (L) 8.9 - 10.3 mg/dL   GFR calc non Af Amer >60 >60 mL/min   GFR calc Af Amer >60 >60 mL/min   Anion gap 6 5 - 15    Comment: Performed at [facility], [place] [address] [place], [place] [address]  Glucose, capillary     Status: Abnormal   Collection Time: 03/21/19  7:55 AM  Result Value Ref Range   Glucose-Capillary 116 (H) 70 - 99 mg/dL    Recent Results (from the past 240 hour(s))  MRSA PCR Screening     Status: None   Collection Time: 03/16/19  3:00 AM   Specimen: Nasopharyngeal  Result Value Ref Range Status   MRSA by PCR NEGATIVE NEGATIVE Final    Comment:        The GeneXpert MRSA Assay (FDA approved for NASAL specimens only), is one component of a comprehensive MRSA colonization surveillance program. It is not intended to diagnose MRSA infection nor to guide or monitor treatment for MRSA infections. Performed at [facility], [place] [address] [place], [place] [address]   Culture, respiratory (non-expectorated)     Status: None   Collection Time: 03/16/19  8:53 AM   Specimen: Tracheal Aspirate; Respiratory  Result Value Ref Range Status   Specimen Description   Final    TRACHEAL ASPIRATE Performed at [facility] [address], [place], [place] [address]    Special Requests   Final    NONE Performed at [facility], [place] [address], [place], [place] [address]    Gram  Stain   Final    ABUNDANT WBC PRESENT, PREDOMINANTLY PMN MODERATE SQUAMOUS EPITHELIAL CELLS PRESENT FEW GRAM POSITIVE COCCI FEW YEAST    Culture   Final    FEW Consistent with normal respiratory flora. Performed at [facility], [place] [address] [place], [place] [address]     Report Status 03/19/2019 FINAL  Final  CSF culture     Status: None (Preliminary result)   Collection Time: 03/20/19  3:11 PM   Specimen: PATH Cytology CSF; Cerebrospinal Fluid  Result Value Ref Range Status   Specimen Description CSF  Final   Special Requests NONE  Final   Gram Stain   Final    WBC PRESENT,BOTH PMN AND MONONUCLEAR NO ORGANISMS SEEN CYTOSPIN SMEAR Performed at [facility], [place] [address] [place], [place] [address]    Culture PENDING  Incomplete   Report Status PENDING  Incomplete    Lipid Panel Recent Labs    03/21/19 2993  TRIG 49    Studies/Results: EEG  Result Date: 03/19/2019 Lora Havens, MD     03/19/2019  2:48 PM Patient Name: Rebecca Bruce MRN: [id number] Epilepsy Attending: Lora Havens Referring Physician/Provider: Dr. Kerney Elbe Date: 03/19/2019 Duration: 24.24 minutes Patient history: 58 year old Covid positive female with altered mental status.  EEG to evaluate for seizures. Level of alertness: Awake/lethargic AEDs during EEG study: Clonazepam, propofol Technical aspects: This EEG study was done with scalp electrodes positioned according to the 10-20 International system of electrode placement. Electrical activity was acquired at a sampling rate of _0  and reviewed with a high frequency filter of _1  and a low frequency filter of _2 . EEG data were recorded continuously and digitally stored. Description: EEG shows continuous generalized 2 to 3 Hz low voltage delta slowing.  No clear posterior dominant rhythm was seen.  Hyperventilation and photic stimulation were not performed. Abnormality -Continuous slow, generalized IMPRESSION: This study is suggestive of moderate to severe diffuse encephalopathy, nonspecific etiology. No seizures or epileptiform discharges were seen throughout the recording. Lora Havens   AM DG Chest Port 1 View  Result Date: 03/21/2019 CLINICAL DATA:  Respiratory failure. EXAM: PORTABLE CHEST 1 VIEW  COMPARISON:  03/20/2019 FINDINGS: Patient is rotated to the right. Endotracheal tube has tip 5.2 cm above the carina. Nasogastric tube is looped once over the stomach as tip is not clearly visualized. Lungs are adequately inflated with minimal left base opacification unchanged which may be due to atelectasis or infection. No definite effusion. Calcified granuloma over the left upper lobe. Cardiomediastinal silhouette and remainder of the exam is unchanged. IMPRESSION: Minimal stable left base opacification likely atelectasis, although infection is possible. Tubes and lines as described. Electronically Signed   By: Marin Olp M.D.   On: 03/21/2019 07:30   AM DG Chest Port 1 View  Result Date: 03/20/2019 CLINICAL DATA:  Respiratory failure. EXAM: PORTABLE CHEST 1 VIEW COMPARISON:  Radiograph 03/18/2019 FINDINGS: Endotracheal tube tip at the thoracic inlet. Enteric tube tip and side-port below the diaphragm. Mild cardiomegaly with unchanged mediastinal contours. Worsening opacity in the left mid lower lung zone. Slight worsening opacity infrahilar right lung. No pneumothorax. IMPRESSION: 1. Worsening opacities in the left mid lower lung zone and infrahilar right lung. 2. Endotracheal tube tip at the thoracic inlet. Enteric tube tip and side-port below the diaphragm. Electronically Signed   By: Keith Rake M.D.   On: 03/20/2019 06:16   DG Abd Portable 1V  Result Date: 03/20/2019 CLINICAL DATA:  NG tube placement. EXAM: PORTABLE ABDOMEN - 1 VIEW COMPARISON:  None. FINDINGS: An enteric tube forms a loop in the region of the gastric fundus, and its tip is in the region of the gastric body. No dilated loops of bowel are seen to suggest obstruction. There is moderate thoracolumbar dextroscoliosis. Bibasilar lung opacities were more fully evaluated on today's earlier chest radiograph. IMPRESSION: Enteric tube in the stomach. Electronically Signed   By: Logan Bores M.D.   On: 03/20/2019 11:13   DG FL GUIDED  LUMBAR PUNCTURE  Result Date: 03/20/2019 CLINICAL DATA:  Coma. COVID (+) EXAM: DIAGNOSTIC LUMBAR PUNCTURE UNDER FLUOROSCOPIC GUIDANCE FLUOROSCOPY TIME:  Fluoroscopy Time:  0.5 minutes Radiation Exposure Index (if provided by the fluoroscopic device): 18.30 mGy Number of Acquired Spot Images: 1 PROCEDURE: Informed consent was obtained from the patient's sister via telephone prior to the procedure, including potential complications of headache, allergy, and pain. With the patient prone, the lower back was prepped with Betadine. 1% Lidocaine was used for local anesthesia. Lumbar puncture was performed at the L2-L3 level using a 20 gauge needle with return of clear CSF. 10.5 ml of CSF was obtained for laboratory studies. The patient tolerated the procedure well and there were no apparent complications. IMPRESSION: Successful L2-L3 fluoroscopic-guided lumbar puncture. 10.5 mL of CSF was obtained for laboratory studies. No immediate postprocedure complication. Electronically Signed   By: Kellie Simmering DO   On: 03/20/2019 14:50    Medications:  Scheduled: . atorvastatin  20 mg Per Tube q1800  . chlorhexidine gluconate (MEDLINE KIT)  15 mL Mouth Rinse BID  . Chlorhexidine Gluconate Cloth  6 each Topical Daily  . clonazePAM  0.5 mg Per Tube TID  . dexamethasone  4 mg Intravenous Q24H  . docusate  100 mg Per Tube Daily  . enoxaparin (LOVENOX) injection  40 mg Subcutaneous Q12H  . famotidine  20 mg Per Tube BID  . feeding supplement (PRO-STAT SUGAR FREE 64)  30 mL Per Tube BID  . insulin aspart  0-15 Units Subcutaneous Q4H  . ipratropium-albuterol  3 mL Nebulization BID  . lactobacillus  1 g Per Tube Q breakfast  . levothyroxine  50 mcg Intravenous Daily  . mouth rinse  15 mL Mouth Rinse 10 times per day  . sodium chloride flush  10-40 mL Intracatheter Q12H  . vitamin C  500 mg Per Tube Daily  . zinc sulfate  220 mg Per Tube Daily   Continuous: . sodium chloride Stopped (03/12/19 1210)  . sodium  chloride 50 mL/hr at 03/21/19 0800  . feeding supplement (VITAL AF 1.2 CAL) 1,000 mL (03/20/19 1241)  . 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 Patient was intubated for airway protection after she started receiving benzodiazepines for treatment of what was presumed to be catatonia.Per caregiver from her group home, at baseline she is communicative, very energetic and talkative person, ambulates on her own. 1.Off sedation at time of AM exam today (12/10). Exam findings localize as severe diffuse cerebral dysfunction. She continues to have increased tone - on today's exam (12/10), extensor tone increased at elbows, hips and knees. MRI on 12/2 showed no abnormality; images personally reviewed, with no abnormal enhancement, no subtle cortical signal changes and no restricted diffusion. Additionally, there is no atrophy or significant white matter disease.   2. Patient has been on CNScoverage for possiblemeningitis.  On IV ceftriaxone. Has completed 10 day course of vancomycin. Although CSF WBC not elevated, this is expected as she has completed her course of ABX treatment for presumed meningitis.  4. WBC was normal in CSF from LP performed yesterday. Has moderately elevated CSF protein is abnormally elevated at 76. 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  5.EEGon 12/3showedno epileptiform activity, but was consistent with an encephalopathy givengeneralized triphasic waves.Nonconvulsive status unlikely given the above. Will repeat EEG today (12/8) 6. 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 Stiff Person Syndrome is also on the DDx, but is unlikely given  its rarity and more likely etiology being Covid encephalopathy. Elevated CSF protein is compatible with but not diagnostic of a resolving viral encephalitis.   Recommendations: - To evaluate the possibility of Stiff Person Syndrome, anti-GAD antibodies are pending. Oligoclonal bands also pending.  --Repeat EEG yesterday was suggestive of a moderate to severe diffuse encephalopathy, nonspecific to etiology.No seizures or epileptiform discharges were seen throughout the recording.  35 minutes spent in the neurological evaluation and management of this critically ill patient. Time spent included image review.   35 minutes spent in the neurological evaluation and management of this critically ill patient.    LOS: 10 days   _0  signed: Dr. Kerney Elbe 03/21/2019  9:13 AM

## 2019-03-21 NOTE — Progress Notes (Addendum)
NAME:  Rebecca Bruce, MRN:  [id number], DOB:  [birth year], LOS: 10 ADMISSION DATE:  03/11/2019, CONSULTATION DATE:  12/4 REFERRING MD:  Jerral Ralph, CHIEF COMPLAINT:  Inability to protect airway   Brief History    58 y/o female brought to [facility] on 11/28 in the setting of altered mental status from her group home.  She was found to have COVID pneumonia, treated with decadron and remdesivir then sent to [facility] on 11/30.  Here has had progressive increase in acute encephalopathy and has become less and less responsive.  Today PCCM was asked to see her because she reached the point that she was aspirating and not responding to pharyngeal/tracheal suctioning.  By report her exam findings have waxed and waned over the last three days but have not generally moved in the right direction.  Neurology saw her on 12/2 and apparently at that time she followed some simple commands, but she was non-verbal at the time with significant rigidity noted in her upper extremities.   At baseline lives in a group home, can bathe herself and have a conversation.  Has baseline anxiety and depression.    Past Medical History  Thyroid disease Hypertension GERD  Significant Hospital Events   11/28 admission [facility] 11/30 transfer to [facility] 12/04 intubation for inability to protect airway 12/05 add diprivan, fentanyl gtt - > Suctioned large plug by RT >> Peak pressure improved after this.  No respiratory effort with SBT.  Consults:  Neurology  Procedures:  12/4 ETT >   Significant Diagnostic Tests:  11/28 CT head > mucosal thickening in ethmoid air cells, study otherwise unremarkable  12/03 MRI Brain > normal MRI brain  12/03 EEG > mod to severe diffuse encephalopathy, non-specific etiology but could be due to toxic metabolic causes  Micro Data:  71/06 urine culture > multiple species 11/28 blood > negative 11/28 SARS COV 2 > positive 12/04 sputum > consistent with normal flora  Antimicrobials:  11/28 Vanc >  11/30, restart 12/2>  11/28 mero >  12/2 11/29 remdesivir > 12/3 12/02 ceftriaxone > discontinued 12/02 vancomycin > off 12/04 flagyl > off  Interim history/subjective:  We will decrease Klonopin to 0.5 mg every 8 hours.  Discontinue propofol and use fentanyl and as needed's for ventilatory anxiety management.  Await results from LP performed on 03/20/1999  Objective   Blood pressure (!) 135/94, pulse 75, temperature 98.1 F (36.7 C), temperature source Axillary, resp. rate 17, height 5\' 2"  (1.575 m), weight 88.3 kg, SpO2 100 %.    Vent Mode: PSV;CPAP FiO2 (%):  [40 %] 40 % Set Rate:  [24 bmp] 24 bmp Vt Set:  [400 mL] 400 mL PEEP:  [5 cmH20] 5 cmH20 Pressure Support:  [12 cmH20] 12 cmH20 Plateau Pressure:  [18 cmH20-19 cmH20] 18 cmH20   Intake/Output Summary (Last 24 hours) at 03/21/2019 0847 Last data filed at 03/21/2019 0800 Gross per 24 hour  Intake 2730.71 ml  Output 3075 ml  Net -344.29 ml   Filed Weights   03/19/19 0422 03/20/19 0324 03/21/19 0322  Weight: 89.2 kg 86.8 kg 88.3 kg    Examination: General: 58 year old female who sedated on propofol fentanyl HEENT: Endotracheal tube gastric tube in place Neuro: Does not follow commands.  We will stop propofol decrease Klonopin 0.5 mg every 8 hourly CV: Heart sounds are regular sinus bradycardia 50 we will discontinue metoprolol PULM: Distant throughout Vent pressure support 12 FIO2 40% PEEP RATE 18 VT spontaneous tidal volumes 380 No significant change in  chest x-ray 03/21/2019 GI: soft, bsx4 active  GU: Amber Extremities: warm/dry, 2+ edema  Skin: no rashes or lesions    Resolved Hospital Problem list     Assessment & Plan:    Acute hypoxic respiratory failure from COVID pneumonia.- resolved but mental status makes extubation tricky Currently on pressure support ventilation but decreased level consciousness precludes extubation unless one-way Wean as tolerated Minimize sedation, decrease Klonopin to 0.5  mg every 8 stop propofol Currently on airborne precautions Add stop date to Decadron     Persistent encephalopathy with catatonia- EEG, MRI benign, neurology following Currently on fentanyl propofol Hold sedation Continue to evaluate neurological status Discontinue propofol Decrease Klonopin Await results of LP performed on 03/20/1999    Underlying MR- resides in group home, able to perform most ADLs on own She will be able to return to [facility] [employer] [facility] Continue to evaluate daily Minimize sedation  Hypothyroidism-  Currently on Synthroid   Best practice:  Diet: tube feeding DVT prophylaxis: lovenox per protocol GI prophylaxis: famotidine Mobility: bed rest Code Status: full Disposition: ICU  Family: Updated family member been updated at length on 03/21/2019.  She is very realistic but the rest of family wants everything done up to and including tracheostomy and PEG placement.  App cct 35 min  Brett Canales Geo Slone ACNP Acute Care Nurse Practitioner [facility] Pul[facility]/Critical Care Please consult Amion 03/21/2019, 8:47 AM

## 2019-03-22 LAB — BASIC METABOLIC PANEL
Anion gap: 7 (ref 5–15)
BUN: 20 mg/dL (ref 6–20)
CO2: 30 mmol/L (ref 22–32)
Calcium: 8.3 mg/dL — ABNORMAL LOW (ref 8.9–10.3)
Chloride: 104 mmol/L (ref 98–111)
Creatinine, Ser: 0.48 mg/dL (ref 0.44–1.00)
GFR calc Af Amer: >60 mL/min (ref >60)
GFR calc non Af Amer: >60 mL/min (ref >60)
Glucose, Bld: 125 mg/dL — ABNORMAL HIGH (ref 70–99)
Potassium: 4.2 mmol/L (ref 3.5–5.1)
Sodium: 141 mmol/L (ref 135–145)

## 2019-03-22 LAB — OLIGOCLONAL BANDS, CSF + SERM

## 2019-03-22 LAB — GLUCOSE, CAPILLARY
Glucose-Capillary: 102 mg/dL — ABNORMAL HIGH (ref 70–99)
Glucose-Capillary: 117 mg/dL — ABNORMAL HIGH (ref 70–99)
Glucose-Capillary: 117 mg/dL — ABNORMAL HIGH (ref 70–99)
Glucose-Capillary: 121 mg/dL — ABNORMAL HIGH (ref 70–99)
Glucose-Capillary: 126 mg/dL — ABNORMAL HIGH (ref 70–99)
Glucose-Capillary: 128 mg/dL — ABNORMAL HIGH (ref 70–99)
Glucose-Capillary: 132 mg/dL — ABNORMAL HIGH (ref 70–99)

## 2019-03-22 LAB — TRIGLYCERIDES: Triglycerides: 44 mg/dL (ref <150)

## 2019-03-22 LAB — MAGNESIUM: Magnesium: 2.1 mg/dL (ref 1.7–2.4)

## 2019-03-22 LAB — PHOSPHORUS: Phosphorus: 4 mg/dL (ref 2.5–4.6)

## 2019-03-22 MED ORDER — PROPRANOLOL HCL 10 MG PO TABS
10.0000 mg | ORAL_TABLET | Freq: Two times a day (BID) | ORAL | Status: DC
  Administered 2019-03-22 – 2019-03-25 (×8): 10 mg
  Filled 2019-03-22 (×10): qty 1

## 2019-03-22 NOTE — Progress Notes (Signed)
NAME:  Rebecca Bruce, MRN:  [id number], DOB:  30-Dec-1960, LOS: 11 ADMISSION DATE:  03/11/2019, CONSULTATION DATE:  12/4 REFERRING MD:  Jerral Ralph, CHIEF COMPLAINT:  Inability to protect airway   Brief History    58 y/o female brought to [facility] on 11/28 in the setting of altered mental status from her group home.  She was found to have COVID pneumonia, treated with decadron and remdesivir then sent to [facility] on 11/30.  Here has had progressive increase in acute encephalopathy and has become less and less responsive.  Today PCCM was asked to see her because she reached the point that she was aspirating and not responding to pharyngeal/tracheal suctioning.  By report her exam findings have waxed and waned over the last three days but have not generally moved in the right direction.  Neurology saw her on 12/2 and apparently at that time she followed some simple commands, but she was non-verbal at the time with significant rigidity noted in her upper extremities.   At baseline lives in a group home, can bathe herself and have a conversation.  Has baseline anxiety and depression.    Past Medical History  Thyroid disease Hypertension GERD  Significant Hospital Events   11/28 admission [facility] 11/30 transfer to [facility] 12/04 intubation for inability to protect airway 12/05 add diprivan, fentanyl gtt - > Suctioned large plug by RT >> Peak pressure improved after this.  No respiratory effort with SBT.  Consults:  Neurology  Procedures:  12/4 ETT >   Significant Diagnostic Tests:  11/28 CT head > mucosal thickening in ethmoid air cells, study otherwise unremarkable  12/03 MRI Brain > normal MRI brain  12/03 EEG > mod to severe diffuse encephalopathy, non-specific etiology but could be due to toxic metabolic causes  Micro Data:  40/98 urine culture > multiple species 11/28 blood > negative 11/28 SARS COV 2 > positive 12/04 sputum > consistent with normal flora  Antimicrobials:  11/28 Vanc >  11/30, restart 12/2>  11/28 mero >  12/2 11/29 remdesivir > 12/3 12/02 ceftriaxone > discontinued 12/02 vancomycin > off 12/04 flagyl > off  Interim history/subjective:  Sedation has been decreased but she was put back on fentanyl drip overnight for tachycardia.  Unable to tolerate spontaneous breathing trial. Objective   Blood pressure 105/63, pulse (!) 59, temperature 97.9 F (36.6 C), temperature source Oral, resp. rate (!) 24, height 5\' 2"  (1.575 m), weight 88.8 kg, SpO2 100 %.    Vent Mode: PRVC FiO2 (%):  [40 %] 40 % Set Rate:  [24 bmp] 24 bmp Vt Set:  [400 mL] 400 mL PEEP:  [5 cmH20] 5 cmH20 Pressure Support:  [12 cmH20] 12 cmH20 Plateau Pressure:  [18 cmH20] 18 cmH20   Intake/Output Summary (Last 24 hours) at 03/22/2019 0928 Last data filed at 03/22/2019 1191 Gross per 24 hour  Intake 2550.35 ml  Output 2725 ml  Net -174.65 ml   Filed Weights   03/20/19 0324 03/21/19 0322 03/22/19 0500  Weight: 86.8 kg 88.3 kg 88.8 kg    Examination: General: 58 year old female who is heavily sedated HEENT: Endotracheal tube is in place Neuro: Only opens eyes to noxious stimuli CV: Heart sounds regular PULM: Diminished in the bases Vent pressure regulated volume control FIO2 40% PEEP 5 RATE 24 VT 400  GI: soft, bsx4 active  GU: Foley Extremities: warm/dry,  edema  Skin: no rashes or lesions     Resolved Hospital Problem list     Assessment & Plan:  Acute hypoxic respiratory failure from COVID pneumonia.- resolved but mental status makes extubation tricky Unable to tolerate spontaneous breathing trial 03/22/2019 Minimize sedation Stop fentanyl drip Decrease respiratory rate to 20 on 03/22/2019     Persistent encephalopathy with catatonia- EEG, MRI benign, neurology following We will discontinue fentanyl at this time Hold sedation Monitor neurological status Await LP findings    Underlying MR- resides in group home, able to perform most ADLs on own She  will not be able to return to group home Minimize sedation Hypothyroidism-  Currently on Synthroid   Best practice:  Diet: tube feeding DVT prophylaxis: lovenox per protocol GI prophylaxis: famotidine Mobility: bed rest Code Status: full Disposition: ICU  Family: Updated family member been updated at length on 03/21/2019.  She is very realistic but the rest of family wants everything done up to and including tracheostomy and PEG placement.  03/22/2019 we will broach the subject of LTAC to family.  App cct 35 min  Rebecca Bruce ACNP Acute Care Nurse Practitioner Rebecca Bruce Pulmonary/Critical Care Please consult Amion 03/22/2019, 9:28 AM

## 2019-03-22 NOTE — Progress Notes (Signed)
RT note: attempted SBT on patient this AM however patient went apneic and backup ventilation alarmed.  Placed patient back on full support ventilator settings.  Tolerating well at this time.  Will continue to monitor.

## 2019-03-22 NOTE — Progress Notes (Signed)
Cortrak Tube Team Note:  Consult received to place a Cortrak feeding tube.   Due to high volume will be unable to place Cortrak feeding tube in this patient today. Patient has NGT to right nare with tube feeds infusing and patient is tolerating. Discussed with RN about not being able to place Cortrak feeding tube in patient today. Next date of service is Friday 12/15.   Mariana Single RD, LDN Clinical Nutrition Pager # [phone]

## 2019-03-23 LAB — CSF CULTURE W GRAM STAIN: Culture: NO GROWTH

## 2019-03-23 LAB — POCT I-STAT 7, (LYTES, BLD GAS, ICA,H+H)
Acid-Base Excess: 10 mmol/L — ABNORMAL HIGH (ref 0.0–2.0)
Bicarbonate: 34.1 mmol/L — ABNORMAL HIGH (ref 20.0–28.0)
Calcium, Ion: 1.18 mmol/L (ref 1.15–1.40)
HCT: 36 % (ref 36.0–46.0)
Hemoglobin: 12.2 g/dL (ref 12.0–15.0)
O2 Saturation: 99 %
Potassium: 3.8 mmol/L (ref 3.5–5.1)
Sodium: 137 mmol/L (ref 135–145)
TCO2: 35 mmol/L — ABNORMAL HIGH (ref 22–32)
pCO2 arterial: 42.2 mmHg (ref 32.0–48.0)
pH, Arterial: 7.515 — ABNORMAL HIGH (ref 7.350–7.450)
pO2, Arterial: 122 mmHg — ABNORMAL HIGH (ref 83.0–108.0)

## 2019-03-23 LAB — PHOSPHORUS: Phosphorus: 3.8 mg/dL (ref 2.5–4.6)

## 2019-03-23 LAB — GLUCOSE, CAPILLARY
Glucose-Capillary: 121 mg/dL — ABNORMAL HIGH (ref 70–99)
Glucose-Capillary: 100 mg/dL — ABNORMAL HIGH (ref 70–99)
Glucose-Capillary: 113 mg/dL — ABNORMAL HIGH (ref 70–99)
Glucose-Capillary: 145 mg/dL — ABNORMAL HIGH (ref 70–99)
Glucose-Capillary: 105 mg/dL — ABNORMAL HIGH (ref 70–99)

## 2019-03-23 LAB — BASIC METABOLIC PANEL
Anion gap: 9 (ref 5–15)
BUN: 21 mg/dL — ABNORMAL HIGH (ref 6–20)
CO2: 29 mmol/L (ref 22–32)
Calcium: 8.4 mg/dL — ABNORMAL LOW (ref 8.9–10.3)
Chloride: 101 mmol/L (ref 98–111)
Creatinine, Ser: 0.45 mg/dL (ref 0.44–1.00)
GFR calc Af Amer: >60 mL/min (ref >60)
GFR calc non Af Amer: >60 mL/min (ref >60)
Glucose, Bld: 98 mg/dL (ref 70–99)
Potassium: 3.9 mmol/L (ref 3.5–5.1)
Sodium: 139 mmol/L (ref 135–145)

## 2019-03-23 LAB — TRIGLYCERIDES
Triglycerides: 62 mg/dL (ref <150)
Triglycerides: 67 mg/dL (ref <150)

## 2019-03-23 LAB — MAGNESIUM: Magnesium: 2.1 mg/dL (ref 1.7–2.4)

## 2019-03-23 NOTE — Progress Notes (Addendum)
Subjective: Patient in bed, intubated, not sedated. Eyes open. Exam has improved significantly since 12/10 when neuro last saw patient.   Objective: Current vital signs: BP (!) 127/53   Pulse 72   Temp 98.6 F (37 C) (Axillary)   Resp 16   Ht 5' 2" (1.575 m)   Wt 88.8 kg   SpO2 99%   BMI 35.81 kg/m  Vital signs in last 24 hours: Temp:  [98.6 F (37 C)-98.9 F (37.2 C)] 98.6 F (37 C) (12/12 0800) Pulse Rate:  [64-88] 72 (12/12 1000) Resp:  [16-29] 16 (12/12 1000) BP: (102-166)/(53-102) 127/53 (12/12 1000) SpO2:  [98 %-100 %] 99 % (12/12 1039) FiO2 (%):  [40 %] 40 % (12/12 1039)  Intake/Output from previous day: 12/11 0701 - 12/12 0700 In: 2708 [I.V.:1218; NG/GT:1490] Out: 800 [Urine:800] Intake/Output this shift: Total I/O In: -  Out: 1325 [Urine:1250; Stool:75] Nutritional status:  Diet Order    None     HEENT: [place]/AT  NGT present Lungs: Intubated  Ext: Warm and well perfused  Neurologic Exam: Ment: Eyes open spontaneously. Blinks intermittently, with higher blink rate than normal. Does not respond to verbal stimuli. Does not follow commands. No attempts to communicate. Does not track CN: PERRL,  No blink to threat. Eyes deviated upwards. doll's eye reflex noted. . Face appears symmetric  In presence of ETT.  Motor/Sensory: patient grimaces to noxious stimuli  In all 4 extremities. With slight attempts at withdrawal in BLE. Toes  upgoing  Lab Results: Results for orders placed or performed during the hospital encounter of 03/11/19 (from the past 48 hour(s))  Glucose, capillary     Status: Abnormal   Collection Time: 03/21/19 12:30 PM  Result Value Ref Range   Glucose-Capillary 148 (H) 70 - 99 mg/dL  Glucose, capillary     Status: Abnormal   Collection Time: 03/21/19  3:21 PM  Result Value Ref Range   Glucose-Capillary 155 (H) 70 - 99 mg/dL  Glucose, capillary     Status: Abnormal   Collection Time: 03/21/19  7:39 PM  Result Value Ref Range    Glucose-Capillary 145 (H) 70 - 99 mg/dL  Glucose, capillary     Status: Abnormal   Collection Time: 03/22/19 12:42 AM  Result Value Ref Range   Glucose-Capillary 117 (H) 70 - 99 mg/dL  Triglycerides     Status: None   Collection Time: 03/22/19  2:54 AM  Result Value Ref Range   Triglycerides 44 <150 mg/dL    Comment: Performed at [facility] [address] [place], [place] [address]  AM Basic metabolic panel     Status: Abnormal   Collection Time: 03/22/19  2:54 AM  Result Value Ref Range   Sodium 141 135 - 145 mmol/L   Potassium 4.2 3.5 - 5.1 mmol/L    Comment: DELTA CHECK NOTED   Chloride 104 98 - 111 mmol/L   CO2 30 22 - 32 mmol/L   Glucose, Bld 125 (H) 70 - 99 mg/dL   BUN 20 6 - 20 mg/dL   Creatinine, Ser 0.48 0.44 - 1.00 mg/dL   Calcium 8.3 (L) 8.9 - 10.3 mg/dL   GFR calc non Af Amer >60 >60 mL/min   GFR calc Af Amer >60 >60 mL/min   Anion gap 7 5 - 15    Comment: Performed at [facility], [place] [address], [place], [place] [address]  AM Magnesium     Status: None   Collection Time: 03/22/19  2:54 AM  Result Value Ref Range   Magnesium 2.1 1.7 - 2.4 mg/dL    Comment: Performed at [facility], [place] [address], [place], [place] [address]  AM Phosphorus     Status: None   Collection Time: 03/22/19  2:54 AM  Result Value Ref Range   Phosphorus 4.0 2.5 - 4.6 mg/dL    Comment: Performed at [facility] [address], [place], [place] [address]  Glucose, capillary     Status: Abnormal   Collection Time: 03/22/19  3:51 AM  Result Value Ref Range   Glucose-Capillary 121 (H) 70 - 99 mg/dL  Glucose, capillary     Status: Abnormal   Collection Time: 03/22/19  8:17 AM  Result Value Ref Range   Glucose-Capillary 117 (H) 70 - 99 mg/dL  Glucose, capillary     Status: Abnormal   Collection Time: 03/22/19 11:24 AM  Result Value Ref Range   Glucose-Capillary 126 (H) 70 - 99 mg/dL  Glucose, capillary     Status: Abnormal   Collection Time: 03/22/19  3:05  PM  Result Value Ref Range   Glucose-Capillary 132 (H) 70 - 99 mg/dL  Glucose, capillary     Status: Abnormal   Collection Time: 03/22/19  8:42 PM  Result Value Ref Range   Glucose-Capillary 128 (H) 70 - 99 mg/dL  Glucose, capillary     Status: Abnormal   Collection Time: 03/22/19 11:23 PM  Result Value Ref Range   Glucose-Capillary 102 (H) 70 - 99 mg/dL  Glucose, capillary     Status: Abnormal   Collection Time: 03/23/19  4:28 AM  Result Value Ref Range   Glucose-Capillary 121 (H) 70 - 99 mg/dL  AM Basic metabolic panel     Status: Abnormal   Collection Time: 03/23/19  6:02 AM  Result Value Ref Range   Sodium 139 135 - 145 mmol/L   Potassium 3.9 3.5 - 5.1 mmol/L   Chloride 101 98 - 111 mmol/L   CO2 29 22 - 32 mmol/L   Glucose, Bld 98 70 - 99 mg/dL   BUN 21 (H) 6 - 20 mg/dL   Creatinine, Ser 0.45 0.44 - 1.00 mg/dL   Calcium 8.4 (L) 8.9 - 10.3 mg/dL   GFR calc non Af Amer >60 >60 mL/min   GFR calc Af Amer >60 >60 mL/min   Anion gap 9 5 - 15    Comment: Performed at [facility], [place] [address] [place], [place] [address]  AM Magnesium     Status: None   Collection Time: 03/23/19  6:02 AM  Result Value Ref Range   Magnesium 2.1 1.7 - 2.4 mg/dL    Comment: Performed at [facility] [address] [place], [place] [address]  AM Phosphorus     Status: None   Collection Time: 03/23/19  6:02 AM  Result Value Ref Range   Phosphorus 3.8 2.5 - 4.6 mg/dL    Comment: Performed at [facility] [address], [place], [place] [address]  Triglycerides     Status: None   Collection Time: 03/23/19  6:02 AM  Result Value Ref Range   Triglycerides 62 <150 mg/dL    Comment: Performed at [facility] [address] [place], [place] [address]  Glucose, capillary     Status: Abnormal   Collection Time: 03/23/19  8:23 AM  Result Value Ref Range   Glucose-Capillary 100 (H) 70 - 99 mg/dL  I-STAT 7, (LYTES, BLD GAS, ICA, H+H)     Status: Abnormal   Collection  Time: 03/23/19  9:16 AM  Result Value Ref Range   pH, Arterial 7.515 (H) 7.350 - 7.450   pCO2 arterial 42.2 32.0 - 48.0 mmHg   pO2, Arterial 122.0 (H) 83.0 - 108.0 mmHg   Bicarbonate 34.1 (H) 20.0 - 28.0 mmol/L   TCO2 35 (H) 22 - 32 mmol/L   O2 Saturation 99.0 %   Acid-Base Excess 10.0 (H) 0.0 - 2.0 mmol/L   Sodium 137 135 - 145 mmol/L   Potassium 3.8 3.5 - 5.1 mmol/L   Calcium, Ion 1.18 1.15 - 1.40 mmol/L   HCT 36.0 36.0 - 46.0 %   Hemoglobin 12.2 12.0 - 15.0 g/dL   Patient temperature HIDE    Collection site RADIAL, ALLEN'S TEST ACCEPTABLE    Drawn by RT    Sample type ARTERIAL     Recent Results (from the past 240 hour(s))  MRSA PCR Screening     Status: None   Collection Time: 03/16/19  3:00 AM   Specimen: Nasopharyngeal  Result Value Ref Range Status   MRSA by PCR NEGATIVE NEGATIVE Final    Comment:        The GeneXpert MRSA Assay (FDA approved for NASAL specimens only), is one component of a comprehensive MRSA colonization surveillance program. It is not intended to diagnose MRSA infection nor to guide or monitor treatment for MRSA infections. Performed at [facility], [place] [address] [place], [place] [address]   Culture, respiratory (non-expectorated)     Status: None   Collection Time: 03/16/19  8:53 AM   Specimen: Tracheal Aspirate; Respiratory  Result Value Ref Range Status   Specimen Description   Final    TRACHEAL ASPIRATE Performed at [facility] [address], [place], [place] [address]    Special Requests   Final    NONE Performed at [facility], [place] [address] [place], [place] [address]    Gram Stain   Final    ABUNDANT WBC PRESENT, PREDOMINANTLY PMN MODERATE SQUAMOUS EPITHELIAL CELLS PRESENT FEW GRAM POSITIVE COCCI FEW YEAST    Culture   Final    FEW Consistent with normal respiratory flora. Performed at [facility], [place] [address] [place], [place] [address]    Report  Status 03/19/2019 FINAL  Final  CSF culture     Status: None (Preliminary result)   Collection Time: 03/20/19  3:11 PM   Specimen: PATH Cytology CSF; Cerebrospinal Fluid  Result Value Ref Range Status   Specimen Description CSF  Final   Special Requests NONE  Final   Gram Stain   Final    WBC PRESENT,BOTH PMN AND MONONUCLEAR NO ORGANISMS SEEN CYTOSPIN SMEAR    Culture   Final    NO GROWTH 3 DAYS Performed at [facility], [address], [place], [place] [address]    Report Status PENDING  Incomplete    Lipid Panel Recent Labs    03/23/19 0602  TRIG 62    Studies/Results: No results found.  Medications:  Scheduled: . atorvastatin  20 mg Per Tube q1800  . chlorhexidine gluconate (MEDLINE KIT)  15 mL Mouth Rinse BID  . Chlorhexidine Gluconate Cloth  6 each Topical Daily  . docusate  100 mg Per Tube Daily  . enoxaparin (LOVENOX) injection  40 mg Subcutaneous Q12H  . famotidine  20 mg Per Tube BID  . feeding  supplement (PRO-STAT SUGAR FREE 64)  30 mL Per Tube BID  . insulin aspart  0-15 Units Subcutaneous Q4H  . lactobacillus  1 g Per Tube Q breakfast  . levothyroxine  50 mcg Intravenous Daily  . mouth rinse  15 mL Mouth Rinse 10 times per day  . propranolol  10 mg Per Tube BID  . sodium chloride flush  10-40 mL Intracatheter Q12H  . vitamin C  500 mg Per Tube Daily  . zinc sulfate  220 mg Per Tube Daily   Continuous: . sodium chloride Stopped (03/12/19 1210)  . sodium chloride 50 mL/hr at 03/22/19 2156  . feeding supplement (VITAL AF 1.2 CAL) 1,000 mL (03/22/19 1426)   Assessment: 58 year old female with past medical history of hypothyroidism, hypertension, developed catatonia while admitted with Covid 19 infection at [facility]. Patient was intubated for airway protection after she started receiving benzodiazepines for treatment of what was presumed to be catatonia.Per caregiver from her group home, at baseline she is communicative, very energetic  and talkative person, ambulates on her own. 1.No sedation at time of exam today, 12/12. There is no response to verbal stimuli. Patient intubated not sedated. PERRL. No attempts to track and no blink to threat. Grimaces to noxious in all 4 extremities.with slight withdrawal if BLE, which is an improvement relative to prior exam. MRI on 12/2 showed no abnormality; images personally reviewed, with no abnormal enhancement, no subtle cortical signal changes and no restricted diffusion. Additionally, there is no atrophy or significant white matter disease.   2. Patient has been on CNScoverage for possiblemeningitis. On IV ceftriaxone. Has completed 10 day course of vancomycin. Although CSF WBC not elevated, lack of abnormal white count is expected, as she has completed her course of ABX treatment for presumed meningitis.  3. WBC was normal in CSF from LP performed on 12/9. Had moderately elevated CSF protein at 76. No Oligoclonal bands observed in CSF. 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  4.EEGon 12/3showedno epileptiform activity, but was consistent with an encephalopathy givengeneralized triphasic waves.Nonconvulsive status unlikely given the above. Will repeat EEG today (12/8) 5. 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 Elevated CSF protein is compatible with but not diagnostic of a resolving viral encephalitis.   Recommendations: - To evaluate the possibility of Stiff Person Syndrome, anti-GAD antibodies are pending -- Management of Covid-19 infection per ICU team -- Neuro will continue to follow.  Laurey Morale, MSN, NP-C [facility] [phone]  Electronically signed: Dr. Kerney Elbe  LOS: 12 days

## 2019-03-23 NOTE — Progress Notes (Signed)
Called and updated sister regarding move to [facility]. Told her at some point we will have to decide trach/PEG vs. Hospice if we continue on present clinical course.  Erskine Emery MD

## 2019-03-23 NOTE — Progress Notes (Signed)
NAME:  Rebecca Bruce, MRN:  [id number], DOB:  [birth year], LOS: 12 ADMISSION DATE:  03/11/2019, CONSULTATION DATE:  12/4 REFERRING MD:  Sloan Leiter, CHIEF COMPLAINT:  Inability to protect airway   Brief History    58 y/o female brought to [facility] on 11/28 in the setting of altered mental status from her group home.  She was found to have COVID pneumonia, treated with decadron and remdesivir then sent to [facility] on 11/30.  Here has had progressive increase in acute encephalopathy and has become less and less responsive.  Today PCCM was asked to see her because she reached the point that she was aspirating and not responding to pharyngeal/tracheal suctioning.  By report her exam findings have waxed and waned over the last three days but have not generally moved in the right direction.  Neurology saw her on 12/2 and apparently at that time she followed some simple commands, but she was non-verbal at the time with significant rigidity noted in her upper extremities.   At baseline lives in a group home, can bathe herself and have a conversation.  Has baseline anxiety and depression.    Past Medical History  Thyroid disease Hypertension GERD  Significant Hospital Events   11/28 admission [facility] 11/30 transfer to [facility] 12/04 intubation for inability to protect airway 12/05 add diprivan, fentanyl gtt - > Suctioned large plug by RT >> Peak pressure improved after this.  No respiratory effort with SBT.  Consults:  Neurology  Procedures:  12/4 ETT >   Significant Diagnostic Tests:  11/28 CT head > mucosal thickening in ethmoid air cells, study otherwise unremarkable  12/03 MRI Brain > normal MRI brain  12/03 EEG > mod to severe diffuse encephalopathy, non-specific etiology but could be due to toxic metabolic causes  Micro Data:  11/27 urine culture > multiple species 11/28 blood > negative 11/28 SARS COV 2 > positive 12/04 sputum > consistent with normal flora  Antimicrobials:  11/28 Vanc >  11/30, restart 12/2>  11/28 mero >  12/2 11/29 remdesivir > 12/3 12/02 ceftriaxone > discontinued 12/02 vancomycin > off 12/04 flagyl > off  Interim history/subjective:  Off all sedation x 24h, still apneic on SBT.  Objective   Blood pressure 118/74, pulse 67, temperature 98.9 F (37.2 C), temperature source Oral, resp. rate (!) 24, height 5\' 2"  (1.575 m), weight 88.8 kg, SpO2 99 %.    Vent Mode: PRVC FiO2 (%):  [40 %] 40 % Set Rate:  [24 bmp] 24 bmp Vt Set:  [400 mL] 400 mL PEEP:  [5 cmH20] 5 cmH20 Plateau Pressure:  [16 cmH20-22 cmH20] 17 cmH20   Intake/Output Summary (Last 24 hours) at 03/23/2019 0856 Last data filed at 03/23/2019 0700 Gross per 24 hour  Intake 2500.39 ml  Output 800 ml  Net 1700.39 ml   Filed Weights   03/20/19 0324 03/21/19 0322 03/22/19 0500  Weight: 86.8 kg 88.3 kg 88.8 kg    Examination: GEN: middle aged woman on VENT HEENT: ETT in place, minimal secretions CV: RRR, ext warm PULM: Clear, no accessory muscle use GI: Soft, +BS EXT: No edmea NEURO: upward gaze preference but will with repeated prompting track slightly, pupils equal and reactive, not triggering vent, good cough/gag, no motor response to pain, increased tone in RUE but not LUE, legs have flaccid tone PSYCH: RASS 0 SKIN: No rashes    Resolved Hospital Problem list     Assessment & Plan:   # Acute hypoxic respiratory failure from COVID pneumonia #  Persistent severe metabolic encephalopathy- MRI, EEG unrevealing.  Some catatonic features.  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  Seems a bit more awake today. # Underlying MR- resides in group home, able to perform most ADLs on own # Hypothyroidism- TSH okay, on synthroig   - F/u anti GAD65 antibody  - No sedation unless physician orders - Continue vent support, check ABG to see  if resting alkalosis contributing to apnea - Probably needs trach but given increased alertness and conflicting family GoC, will give a little more time  Best practice:  Diet: tube feeding DVT prophylaxis: lovenox per protocol GI prophylaxis: famotidine Mobility: bed rest Code Status: full Disposition: ICU, [place] if there are beds as neuro workup here is complete Family: Will reach out later today  34 minutes critical care time Erskine Emery MD PCCM

## 2019-03-24 ENCOUNTER — Inpatient Hospital Stay (HOSPITAL_COMMUNITY): Payer: Medicare Other

## 2019-03-24 DIAGNOSIS — Z9911 Dependence on respirator [ventilator] status: Secondary | ICD-10-CM

## 2019-03-24 LAB — POCT I-STAT 7, (LYTES, BLD GAS, ICA,H+H)
Acid-Base Excess: 9 mmol/L — ABNORMAL HIGH (ref 0.0–2.0)
Bicarbonate: 32.8 mmol/L — ABNORMAL HIGH (ref 20.0–28.0)
Calcium, Ion: 1.2 mmol/L (ref 1.15–1.40)
HCT: 37 % (ref 36.0–46.0)
Hemoglobin: 12.6 g/dL (ref 12.0–15.0)
O2 Saturation: 100 %
Patient temperature: 98.5
Potassium: 3.7 mmol/L (ref 3.5–5.1)
Sodium: 135 mmol/L (ref 135–145)
TCO2: 34 mmol/L — ABNORMAL HIGH (ref 22–32)
pCO2 arterial: 42.1 mmHg (ref 32.0–48.0)
pH, Arterial: 7.499 — ABNORMAL HIGH (ref 7.350–7.450)
pO2, Arterial: 155 mmHg — ABNORMAL HIGH (ref 83.0–108.0)

## 2019-03-24 LAB — GLUCOSE, CAPILLARY
Glucose-Capillary: 114 mg/dL — ABNORMAL HIGH (ref 70–99)
Glucose-Capillary: 114 mg/dL — ABNORMAL HIGH (ref 70–99)
Glucose-Capillary: 114 mg/dL — ABNORMAL HIGH (ref 70–99)
Glucose-Capillary: 127 mg/dL — ABNORMAL HIGH (ref 70–99)
Glucose-Capillary: 129 mg/dL — ABNORMAL HIGH (ref 70–99)
Glucose-Capillary: 130 mg/dL — ABNORMAL HIGH (ref 70–99)
Glucose-Capillary: 135 mg/dL — ABNORMAL HIGH (ref 70–99)
Glucose-Capillary: 135 mg/dL — ABNORMAL HIGH (ref 70–99)

## 2019-03-24 LAB — TRIGLYCERIDES: Triglycerides: 61 mg/dL (ref <150)

## 2019-03-24 IMAGING — DX DG CHEST 1V PORT
1 series · 1 of 1 positions shown · non-contrast
Comparison: [DATE].

CLINICAL DATA: Acute respiratory failure with hypoxia.

EXAM:
PORTABLE CHEST 1 VIEW

[chest ap]
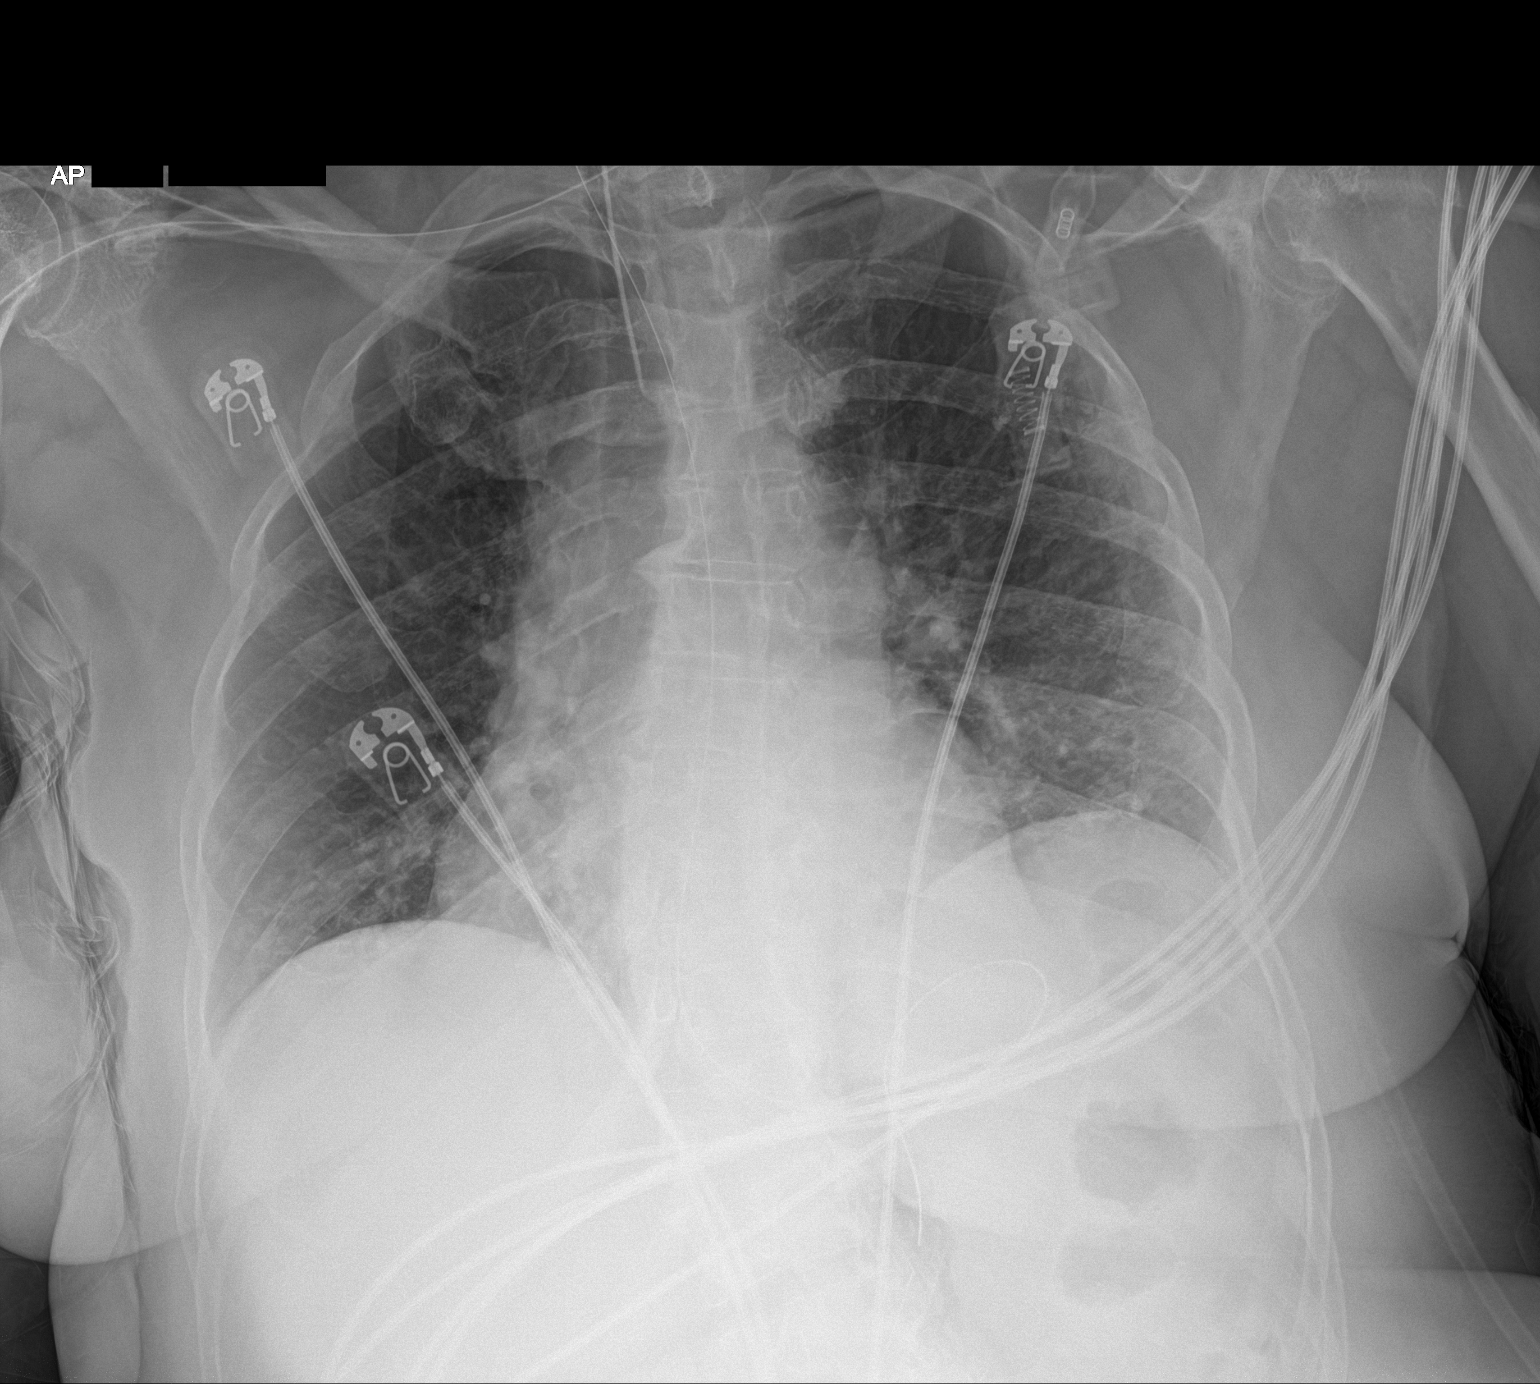

[1 of 1 positions shown; findings below may reference images not displayed]

FINDINGS: Stable cardiomediastinal silhouette. Endotracheal and nasogastric
tubes are unchanged in position. No pneumothorax or pleural effusion
is noted. Right lung is clear. Mild left basilar atelectasis or
infiltrate is noted. Bony thorax is unremarkable.
IMPRESSION: Stable support apparatus. Stable left basilar opacity as described
above.

## 2019-03-24 MED ORDER — ENOXAPARIN SODIUM 60 MG/0.6ML ~~LOC~~ SOLN
45.0000 mg | Freq: Two times a day (BID) | SUBCUTANEOUS | Status: DC
  Administered 2019-03-24 – 2019-03-27 (×7): 45 mg via SUBCUTANEOUS
  Filled 2019-03-24 (×7): qty 0.6

## 2019-03-24 NOTE — Progress Notes (Signed)
[place] Progress Note Patient Name: Rebecca Bruce DOB: July 20, 1960 MRN: [id number]   Date of Service  03/24/2019  HPI/Events of Note  COVID+ patient, ventilated on PRVC with target TV 400, PEEP 5, FiO2 0.30 who transferred to [facility] overnight for ongoing management.  I was unable to camera into room due to camera being offline at this time, however I spoke with the RN and reviewed the patient's condition. They are hemodynamically stable with SBP in 140s-150s, saturating 100%, NSR with HR 73 bpm. Tube feeds running at 50cc/hr and mIVF with NS @ 50cc/hr. They are in no distress, however they also do not respond to stimuli even though they are off sedation. Per notes, this is consistent with their behavior the past few days at least.  eICU Interventions  Continue current plan of care. No changes at this time.     Intervention Category Evaluation Type: New Patient Evaluation  Charlott Rakes 03/24/2019, 6:33 AM

## 2019-03-24 NOTE — Progress Notes (Signed)
Pt picked up by [facility] to transfer to [facility].

## 2019-03-24 NOTE — Progress Notes (Addendum)
NAME:  Rebecca Bruce, MRN:  [id number], DOB:  1960-04-15, LOS: 52 ADMISSION DATE:  03/11/2019, CONSULTATION DATE:  12/4 REFERRING MD:  Rebecca Bruce, CHIEF COMPLAINT:  Inability to protect airway   Brief History   58 y/o female brought to [facility] on 11/28 in the setting of altered mental status from her group home.  She was found to have COVID pneumonia, treated with decadron and remdesivir then sent to [facility] on 11/30.  Here has had progressive increase in acute encephalopathy and has become less and less responsive.  12/4 PCCM asked to see her because she reached the point that she was aspirating and not responding to pharyngeal/tracheal suctioning.  By report her exam findings have waxed and waned over the last three days but have not generally moved in the right direction.  Neurology saw her on 12/2 and apparently at that time she followed some simple commands, but she was non-verbal at the time with significant rigidity noted in her upper extremities.   At baseline lives in a group home, can bathe herself and have a conversation.  Has baseline anxiety and depression.  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   Past Medical History  Thyroid disease Hypertension GERD Anxiety  Depression Rebecca Bruce Events   11/28 Admission Rebecca [facility] 11/30 Transfer to Rebecca Bruce 12/04 Intubation for inability to protect airway 12/05 Diprivan, fentanyl gtt. Suctioned large plug by RT >> Peak pressure improved.  Tol SBT 12/12 Tx to [facility]. Sedation decreased but put back on fentanyl drip overnight for tachycardia.  Failed SBT 12/13 PSV wean, mental status barrier to extubation  Consults:  Neurology  Procedures:  12/4 ETT >>  Significant Diagnostic Tests:  11/28 CT head > mucosal thickening in ethmoid air cells, study otherwise unremarkable  12/03 MRI Brain > normal MRI brain  12/03 EEG > mod to severe diffuse  encephalopathy, non-specific etiology but could be due to toxic metabolic causes  Micro Data:  11/27 urine culture > multiple species 11/28 blood > negative 11/28 SARS COV 2 > positive 12/04 sputum > normal flora  Antimicrobials:  11/28 Vanc > 11/30, restart 12/2 > 12/7 11/28 mero >  12/2 11/29 remdesivir > 12/3 12/02 ceftriaxone > 12/7 12/04 flagyl > off  Interim history/subjective:  RT reports pt weaning on PSV. No sedation.  Afebrile / WBC wnl I/O- 3.6L UOP, net neg 1.4L in 24h  Objective   Blood pressure (!) 88/59, pulse 71, temperature 98.6 F (37 C), temperature source Oral, resp. rate 20, height 5\' 2"  (1.575 m), weight 88.9 kg, SpO2 94 %.    Vent Mode: PSV;CPAP FiO2 (%):  [30 %-40 %] 30 % Set Rate:  [8 bmp] 8 bmp Vt Set:  [400 mL] 400 mL PEEP:  [5 cmH20] 5 cmH20 Pressure Support:  [5 cmH20-10 cmH20] 5 cmH20 Plateau Pressure:  [10 cmH20-13 cmH20] 12 cmH20   Intake/Output Summary (Last 24 hours) at 03/24/2019 1211 Last data filed at 03/24/2019 1100 Gross per 24 hour  Intake 2062.73 ml  Output 2895 ml  Net -832.27 ml   Filed Weights   03/21/19 0322 03/22/19 0500 03/24/19 0449  Weight: 88.3 kg 88.8 kg 88.9 kg    Examination: General: adult female lying in bed on vent in NAD HEENT: MM pink/moist, ETT, eyes open, pupils =/reactive Neuro: Awake, no follow commands, does not track  CV: s1s2 RRR, no m/r/g PULM:  Non-labored  on PSV, lungs bilaterally clear  GI: soft, bsx4 active  Extremities: warm/dry, no edema  Skin: no rashes or lesions  Resolved Bruce Problem list     Assessment & Plan:   Acute hypoxic respiratory failure from COVID pneumonia Failure from COVID resolved but mental status remains barrier to extubation -Daily PSV as tolerated -reduce baseline rate to 16, allow pt to set own rate -minimize sedating medications as able  -could consider trial extubation and if reintubated > proceed straight to trach   Persistent encephalopathy with  catatonia EEG, MRI benign  -Appreciate Neurology evaluation -monitor for neuro changes  -CSF culture negative, await anti-GAD65 antibody  Underlying MR Resides in group home, able to perform most ADLs on own -not likely to be able to return to group home post discharge  -minimize sedation   Hypothyroidism -continue synthroid   Best practice:  Diet: tube feeding DVT prophylaxis: lovenox per protocol GI prophylaxis: famotidine Mobility: bed rest Code Status: full Disposition: ICU  Family: Sister-in-law Rebecca Bruce) updated via phone 12/13 on patient status. Family indicates she is "frequently afraid and will sit staring off into space and not answer".  Rebecca Bruce indicates we should try to tell her that "Rebecca Bruce is coming to get her and she needs to get up".    CC Time: 35 minutes  Noe Gens, MSN, NP-C  Pulmonary & Critical Care 03/24/2019, 1:13 PM   Please see [email or web address] for pager details.    Pulmonary critical care attending:  58 year old female admitted for COVID-19 pneumonia.  Patient developed persistent encephalopathy and catatonic type state.  Underwent evaluation by neurology.  Question of possible Covid related encephalitis versus stiff person syndrome, anti-GDA antibodies pending.  BP 139/74   Pulse 69   Temp 98.6 F (37 C) (Oral)   Resp 20   Ht 5\' 2"  (1.575 m)   Wt 88.9 kg   SpO2 97%   BMI 35.85 kg/m   General: Relatively unresponsive will blink randomly not following any commands intubated on mechanical ventilation critically ill Heart: Regular rate rhythm, S1-S2 Lungs: Bilateral ventilated breath sounds Abdomen: Soft nontender Extremities: No withdrawal to pain, no edema Neuro: Blinks randomly, will not follow commands, no withdrawal to pain does have cough and gag.  Labs: Reviewed Chest x-ray: Lines and tubes repeat place after transport. The patient's images have been independently reviewed by me.    Assessment: Acute hypoxemic respiratory failure  requiring intubation mechanical ventilation secondary to COVID-19 pneumonia, pulmonary parenchyma improved on chest imaging.  Oxygenation also improved.  Patient able to tolerate SBT. Persistent state of encephalopathy, unclear etiology, question related to Covid 19, anti-GDA antibodies pending, I wonder if we could be dealing with catatonia related to hypoactive ICU delirium   Plan: Patient may need prolonged mechanical support pending family wishes.  Awaiting anti-GDA antibodies sent on 03/21/2019.  Unclear if this is all related to COVID-19 or not Neurologic work-up so far has been unrevealing  This patient is critically ill with multiple organ system failure; which, requires frequent high complexity decision making, assessment, support, evaluation, and titration of therapies. This was completed through the application of advanced monitoring technologies and extensive interpretation of multiple databases. During this encounter critical care time was devoted to patient care Bruce described in this note for 32 minutes.  [place] [facility] 03/24/2019 3:15 PM

## 2019-03-25 LAB — BASIC METABOLIC PANEL
Anion gap: 7 (ref 5–15)
BUN: 19 mg/dL (ref 6–20)
CO2: 28 mmol/L (ref 22–32)
Calcium: 8.3 mg/dL — ABNORMAL LOW (ref 8.9–10.3)
Chloride: 101 mmol/L (ref 98–111)
Creatinine, Ser: 0.4 mg/dL — ABNORMAL LOW (ref 0.44–1.00)
GFR calc Af Amer: >60 mL/min (ref >60)
GFR calc non Af Amer: >60 mL/min (ref >60)
Glucose, Bld: 131 mg/dL — ABNORMAL HIGH (ref 70–99)
Potassium: 3.8 mmol/L (ref 3.5–5.1)
Sodium: 136 mmol/L (ref 135–145)

## 2019-03-25 LAB — CBC
HCT: 36.6 % (ref 36.0–46.0)
Hemoglobin: 11.6 g/dL — ABNORMAL LOW (ref 12.0–15.0)
MCH: 26.5 pg (ref 26.0–34.0)
MCHC: 31.7 g/dL (ref 30.0–36.0)
MCV: 83.8 fL (ref 80.0–100.0)
Platelets: 252 10*3/uL (ref 150–400)
RBC: 4.37 MIL/uL (ref 3.87–5.11)
RDW: 15.7 % — ABNORMAL HIGH (ref 11.5–15.5)
WBC: 11.8 10*3/uL — ABNORMAL HIGH (ref 4.0–10.5)
nRBC: 0 % (ref 0.0–0.2)

## 2019-03-25 LAB — GLUCOSE, CAPILLARY
Glucose-Capillary: 104 mg/dL — ABNORMAL HIGH (ref 70–99)
Glucose-Capillary: 112 mg/dL — ABNORMAL HIGH (ref 70–99)
Glucose-Capillary: 135 mg/dL — ABNORMAL HIGH (ref 70–99)
Glucose-Capillary: 138 mg/dL — ABNORMAL HIGH (ref 70–99)
Glucose-Capillary: 149 mg/dL — ABNORMAL HIGH (ref 70–99)
Glucose-Capillary: 192 mg/dL — ABNORMAL HIGH (ref 70–99)

## 2019-03-25 MED ORDER — PHENYLEPHRINE HCL-NACL 10-0.9 MG/250ML-% IV SOLN
0.0000 ug/min | INTRAVENOUS | Status: DC
  Administered 2019-03-26: 185 ug/min via INTRAVENOUS
  Filled 2019-03-25 (×2): qty 250

## 2019-03-25 MED ORDER — PHENYLEPHRINE HCL-NACL 10-0.9 MG/250ML-% IV SOLN
INTRAVENOUS | Status: AC
  Administered 2019-03-25: 50 ug/min via INTRAVENOUS
  Filled 2019-03-25: qty 250

## 2019-03-25 MED ORDER — SODIUM CHLORIDE 0.9 % IV SOLN
Freq: Once | INTRAVENOUS | Status: AC
  Administered 2019-03-25: via INTRAVENOUS

## 2019-03-25 MED ORDER — ALBUMIN HUMAN 25 % IV SOLN
25.0000 g | Freq: Once | INTRAVENOUS | Status: AC
  Administered 2019-03-25: 25 g via INTRAVENOUS
  Filled 2019-03-25: qty 100

## 2019-03-25 MED ORDER — SODIUM CHLORIDE 0.9 % IV BOLUS
250.0000 mL | Freq: Once | INTRAVENOUS | Status: AC
  Administered 2019-03-25: 250 mL via INTRAVENOUS

## 2019-03-25 NOTE — Progress Notes (Signed)
_  NAME:  Rebecca Bruce, MRN:  [id number], DOB:  [birth year], LOS: 19 ADMISSION DATE:  03/11/2019, CONSULTATION DATE:  12/4 REFERRING MD:  Sloan Leiter, CHIEF COMPLAINT:  Inability to protect airway   Brief History   58 y/o female admitted on 11/28 in setting of confusion, had COVID pneumonia, developed catatonia, was intubated on 12/14.    Past Medical History  Thyroid disease Hypertension GERD Anxiety  Depression [place] [facility] Events   11/28 Admission [facility] 11/30 Transfer to [facility] 12/04 Intubation for inability to protect airway 12/05 Diprivan, fentanyl gtt. Suctioned large plug by RT >> Peak pressure improved.  Tol SBT 12/12 Tx to [place]. Sedation decreased but put back on fentanyl drip overnight for tachycardia.  Failed SBT 12/13 PSV wean, mental status barrier to extubation 12/13 weaned all day on 5/5  Consults:  Neurology  Procedures:  12/4 ETT >>  Significant Diagnostic Tests:  11/28 CT head > mucosal thickening in ethmoid air cells, study otherwise unremarkable  12/03 MRI Brain > normal MRI brain  12/03 EEG > mod to severe diffuse encephalopathy, non-specific etiology but could be due to toxic metabolic causes  Micro Data:  11/27 urine culture > multiple species 11/28 blood > negative 11/28 SARS COV 2 > positive 12/04 sputum > normal flora  Antimicrobials:  11/28 Vanc > 11/30, restart 12/2 > 12/7 11/28 mero >  12/2 11/29 remdesivir > 12/3 12/02 ceftriaxone > 12/7 12/04 flagyl > off    Interim history/subjective:  Weaned all day yesterday Grimace to noxious stimuli Will track  Objective   Blood pressure (!) 113/53, pulse 90, temperature 99.1 F (37.3 C), temperature source Oral, resp. rate (!) 21, height 5\' 2"  (1.575 m), weight 87.9 kg, SpO2 99 %.    Vent Mode: PSV;CPAP FiO2 (%):  [30 %] 30 % PEEP:  [5 cmH20] 5 cmH20 Pressure Support:  [5 cmH20] 5 cmH20 Plateau Pressure:  [19 cmH20] 19 cmH20   Intake/Output Summary (Last  24 hours) at 03/25/2019 0945 Last data filed at 03/25/2019 0800 Gross per 24 hour  Intake 2593.33 ml  Output 2550 ml  Net 43.33 ml   Filed Weights   03/22/19 0500 03/24/19 0449 03/25/19 0223  Weight: 88.8 kg 88.9 kg 87.9 kg    Examination:  General:  In bed on vent HENT: NCAT ETT in place PULM: CTA B, vent supported breathing CV: RRR, no mgr GI: BS+, soft, nontender MSK: normal bulk and tone Neuro: awake on vent   Resolved Hospital Problem list     Assessment & Plan:  Catatonia is most likely explanation Underlying mental retardation > grimace to noxious stimuli > continue to hold sedation  > monitor carefully in ICU  Acute respiratory failure with hypoxemia due to COVID 19 pneumonia: oxygenation, cough, and vent mechanics favor extubation Extubate NPO  Aspiration precautions  Hypothyroidism synthroid   Best practice:  Diet: tube feeding Pain/Anxiety/Delirium protocol (if indicated): RASS goal 0, hold sedation VAP protocol (if indicated): d/c  DVT prophylaxis: lovenox GI prophylaxis: d/c Glucose control: SSI Mobility: bed rest Code Status: full Family Communication: will update today Disposition: remain in ICU  Labs   CBC: Recent Labs  Lab 03/19/19 1027 03/20/19 0132 03/21/19 0613 03/23/19 0916 03/24/19 0600 03/25/19 0340  WBC 9.8 11.1* 10.5  --   --  11.8*  NEUTROABS 6.5 8.3* 7.2  --   --   --   HGB 13.8 11.8* 12.6 12.2 12.6 11.6*  HCT 42.7 37.1 40.5 36.0 37.0  36.6  MCV 84.4 83.0 84.9  --   --  83.8  PLT 270 270 250  --   --  [id number]    Basic Metabolic Panel: Recent Labs  Lab 03/19/19 0718 03/20/19 0132 03/21/19 0613 03/22/19 0254 03/23/19 0602 03/23/19 0916 03/24/19 0600 03/25/19 0340  NA  --  141 139 141 139 137 135 136  K  --  3.9 5.3* 4.2 3.9 3.8 3.7 3.8  CL  --  104 103 104 101  --   --  101  CO2  --  28 30 30 29   --   --  28  GLUCOSE  --  111* 112* 125* 98  --   --  131*  BUN  --  17 19 20  21*  --   --  19  CREATININE  --   0.42* 0.44 0.48 0.45  --   --  0.40*  CALCIUM  --  8.3* 8.4* 8.3* 8.4*  --   --  8.3*  MG 2.1 2.0 2.3 2.1 2.1  --   --   --   PHOS 3.9 3.7 4.5 4.0 3.8  --   --   --    GFR: Estimated Creatinine Clearance: 79.9 mL/min (A) (by C-G formula based on SCr of 0.4 mg/dL (L)). Recent Labs  Lab 03/19/19 1027 03/20/19 0132 03/21/19 0613 03/25/19 0340  WBC 9.8 11.1* 10.5 11.8*    Liver Function Tests: No results for input(s): AST, ALT, ALKPHOS, BILITOT, PROT, ALBUMIN in the last 168 hours. No results for input(s): LIPASE, AMYLASE in the last 168 hours. No results for input(s): AMMONIA in the last 168 hours.  ABG    Component Value Date/Time   PHART 7.499 (H) 03/24/2019 0600   PCO2ART 42.1 03/24/2019 0600   PO2ART 155.0 (H) 03/24/2019 0600   HCO3 32.8 (H) 03/24/2019 0600   TCO2 34 (H) 03/24/2019 0600   O2SAT 100.0 03/24/2019 0600     Coagulation Profile: No results for input(s): INR, PROTIME in the last 168 hours.  Cardiac Enzymes: No results for input(s): CKTOTAL, CKMB, CKMBINDEX, TROPONINI in the last 168 hours.  HbA1C: Hemoglobin A1C  Date/Time Value Ref Range Status  02/03/2016 12:00 AM 6.3  Final   Hgb A1c MFr Bld  Date/Time Value Ref Range Status  03/09/2019 01:24 PM 6.1 (H) 4.8 - 5.6 % Final    Comment:    (NOTE) Pre diabetes:          5.7%-6.4% Diabetes:              >6.4% Glycemic control for   <7.0% adults with diabetes   07/13/2018 10:23 AM 6.0 (H) <5.7 % of total Hgb Final    Comment:    For someone without known diabetes, a hemoglobin  A1c value between 5.7% and 6.4% is consistent with prediabetes and should be confirmed with a  follow-up test. . For someone with known diabetes, a value <7% indicates that their diabetes is well controlled. A1c targets should be individualized based on duration of diabetes, age, comorbid conditions, and other considerations. . This assay result is consistent with an increased risk of diabetes. . Currently, no  consensus exists regarding use of hemoglobin A1c for diagnosis of diabetes for children. .     CBG: Recent Labs  Lab 03/24/19 1203 03/24/19 1638 03/24/19 1953 03/24/19 2336 03/25/19 0358  GLUCAP 129* 135* 114* 135* 138*     Critical care time: 31 minutes    [name] Awkward, MD [place] [facility]  Pager: [phone] Cell: [phone] If no response, call [phone]

## 2019-03-25 NOTE — Progress Notes (Signed)
DR Lucile Shutters ON CAMERA VIEWING PATIENT AND GV THIS NURSE TO START NEO ORDER, AND HE ALSO ORDERED ALBUMIN REQUESTED STAT FROM PHARMACY, AND ALSO TO GIVE ANOTHER NS BOLUS 250 GIVEN SEE MAR

## 2019-03-25 NOTE — Progress Notes (Signed)
NAME:  Rebecca Bruce, MRN:  [id number], DOB:  1961/01/21, LOS: 67 ADMISSION DATE:  03/11/2019, CONSULTATION DATE:  12/4 REFERRING MD:  Sloan Leiter, CHIEF COMPLAINT:  Inability to protect airway   Brief History   58 y/o female brought to [facility] on 11/28 in the setting of altered mental status from her group home.  She was found to have COVID pneumonia, treated with decadron and remdesivir then sent to [facility] on 11/30.  Here has had progressive increase in acute encephalopathy and has become less and less responsive.  12/4 PCCM asked to see her because she reached the point that she was aspirating and not responding to pharyngeal/tracheal suctioning.  By report her exam findings have waxed and waned over the last three days but have not generally moved in the right direction.  Neurology saw her on 12/2 and apparently at that time she followed some simple commands, but she was non-verbal at the time with significant rigidity noted in her upper extremities.   At baseline lives in a group home, can bathe herself and have a conversation.  Has baseline anxiety and depression.  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   Past Medical History  Thyroid disease Hypertension GERD Anxiety  Depression [facility] Events   11/28 Admission [facility] 11/30 Transfer to [facility] 12/04 Intubation for inability to protect airway 12/05 Diprivan, fentanyl gtt. Suctioned large plug by RT >> Peak pressure improved.  Tol SBT 12/12 Tx to [place]. Sedation decreased but put back on fentanyl drip overnight for tachycardia.  Failed SBT 12/13 PSV wean, mental status barrier to extubation 12/14: Extubated.  During the evening hours patient hypotensive.  This was after receiving propranolol placed on Neo-Synephrine and administered albumin Consults:  Neurology  Procedures:  12/4 ETT >>12/15  Significant Diagnostic Tests:  11/28 CT head  > mucosal thickening in ethmoid air cells, study otherwise unremarkable  12/03 MRI Brain > normal MRI brain  12/03 EEG > mod to severe diffuse encephalopathy, non-specific etiology but could be due to toxic metabolic causes  Micro Data:  11/27 urine culture > multiple species 11/28 blood > negative 11/28 SARS COV 2 > positive 12/04 sputum > normal flora  Antimicrobials:  11/28 Vanc > 11/30, restart 12/2 > 12/7 11/28 mero >  12/2 11/29 remdesivir > 12/3 12/02 ceftriaxone > 12/7 12/04 flagyl > off  Interim history/subjective:  Looks comfortable.  Down to 2 liters   Objective   Blood pressure (Abnormal) 113/53, pulse 90, temperature 99.1 F (37.3 C), temperature source Oral, resp. rate (Abnormal) 21, height 5\' 2"  (1.575 m), weight 87.9 kg, SpO2 99 %.    Vent Mode: PSV;CPAP FiO2 (%):  [30 %] 30 % PEEP:  [5 cmH20] 5 cmH20 Pressure Support:  [5 cmH20] 5 cmH20 Plateau Pressure:  [19 cmH20] 19 cmH20   Intake/Output Summary (Last 24 hours) at 03/25/2019 0934 Last data filed at 03/25/2019 0800 Gross per 24 hour  Intake 2593.33 ml  Output 2550 ml  Net 43.33 ml   Filed Weights   03/22/19 0500 03/24/19 0449 03/25/19 0223  Weight: 88.8 kg 88.9 kg 87.9 kg    Examination: General lying in bed. No distress currently still requiring oral sxn HENT feeding tube in place MMM Pulm scattered rhonchi Card RRR no MRG abd soft not tender Ext dependent edema pulses palp Neuro eyes open but  still not responding.  [facility]  Problem list     Assessment & Plan:   Acute hypoxic respiratory failure from COVID pneumonia Successfully extubated on 12/14: Remains aspiration risk Plan Supplemental oxygen N.p.o. status Mobilization as tolerated  Persistent encephalopathy with catatonia, has history of underlying mental retardation  EEG, MRI benign  -Appreciate Neurology evaluation Plan Awaiting anti-- GAD 65 antibody Continuing supportive care   Hypotension -Etiology not  entirely clear,Had been on Inderal, not currently meeting SIRS criteria Plan Continue Neo-Synephrine for mean arterial pressure goal greater than 65 Repeat CBC, had mild leukocytosis before if continuing to rise will repeat chest x-ray and blood culture Ck cortisol  Hypothyroidism Plan Continue Synthroid  Best practice:  Diet: tube feeding DVT prophylaxis: lovenox per protocol GI prophylaxis: famotidine Mobility:OOB Code Status: full Disposition: ICU  Family: [name]) updated via phone 12/13 on patient status. Family indicates she is "frequently afraid and will sit staring off into space and not answer".  [name] indicates we should try to tell her that "[name] is coming to get her and she needs to get up".    Remains high risk for re-intubation   Erick Colace ACNP-BC [place] Pager # [phone] OR # [phone] if no answer

## 2019-03-25 NOTE — Progress Notes (Signed)
NOTIFIED CRITICAL CARE LINE SPOKE WITH JULIA , PER MD IN A CODE, PT BP DROPPED 72/41 MAP 52 66/38 MAP 48, SCHEDULED PROPANOLOL 10MG  WAS JUST GIVEN FEW MIN AGO VIA TUBE, BUT BP PREVIOUS WAS HIGH PREVIOUS SEE VITAL SIGNS , 178/96 167/95 149/99, PER JULIA TO GIVE 250 BOLUS OF NS, GIVEN AND TO TEXT HOSPITALIST DAVID TO COME TO SEE PATIENT, TEXT SENT VIA AMION TO DAVID ON CALL FOR CVG, WAITING FOR RESPONSE AND JULIA REMAINS ON CAMERA OBSERVING PATIENT.

## 2019-03-25 NOTE — Progress Notes (Signed)
SLP Cancellation Note  Patient Details Name: Rebecca Bruce MRN: [id number] DOB: [birth year]   Cancelled treatment:       Reason Eval/Treat Not Completed: Patient's level of consciousness. Pt extubated this am.  Communicated with RN.  Pt not ready for swallow re-assessment.  Will f/u next date.  [name] L. Tivis Ringer, MA CCC/SLP Acute Rehabilitation Services Office number [phone]     Juan Quam Laurice 03/25/2019, 3:17 PM

## 2019-03-25 NOTE — Procedures (Signed)
Extubation Procedure Note  Patient Details:   Name: Rebecca Bruce DOB: Nov 07, 1960 MRN: [id number]   Airway Documentation:    Vent end date: 03/25/19 Vent end time: 1020   Evaluation  O2 sats: stable throughout Complications: No apparent complications Patient did tolerate procedure well. Bilateral Breath Sounds: Diminished, Rhonchi   Yes.  Pt extubated per physician order. Pt with positive cuff leak. Prior to extubation pt suctioned orally and via ETT with moderate amount of secretions. Pt extubated to 4L nasal cannula. MD aware pt non verbal after extubatoin with gurgling on secretions noted. RT and RN able to suction with yankauer. Pt placed on 4L nasal cannula with humidity for comfort. RT will continue to monitor.    Sharla Kidney 03/25/2019, 12:57 PM

## 2019-03-25 NOTE — Progress Notes (Signed)
Pt left on wean mode d/t desynchrony when switched back to full support.  Pt seems comfortable and pulling great volumes for her.  RT will continue to monitor.

## 2019-03-26 LAB — BASIC METABOLIC PANEL
Anion gap: 11 (ref 5–15)
BUN: 26 mg/dL — ABNORMAL HIGH (ref 6–20)
CO2: 21 mmol/L — ABNORMAL LOW (ref 22–32)
Calcium: 7.8 mg/dL — ABNORMAL LOW (ref 8.9–10.3)
Chloride: 105 mmol/L (ref 98–111)
Creatinine, Ser: 0.87 mg/dL (ref 0.44–1.00)
GFR calc Af Amer: >60 mL/min (ref >60)
GFR calc non Af Amer: >60 mL/min (ref >60)
Glucose, Bld: 112 mg/dL — ABNORMAL HIGH (ref 70–99)
Potassium: 5.1 mmol/L (ref 3.5–5.1)
Sodium: 137 mmol/L (ref 135–145)

## 2019-03-26 LAB — GLUCOSE, CAPILLARY
Glucose-Capillary: 116 mg/dL — ABNORMAL HIGH (ref 70–99)
Glucose-Capillary: 95 mg/dL (ref 70–99)
Glucose-Capillary: 105 mg/dL — ABNORMAL HIGH (ref 70–99)
Glucose-Capillary: 118 mg/dL — ABNORMAL HIGH (ref 70–99)
Glucose-Capillary: 131 mg/dL — ABNORMAL HIGH (ref 70–99)
Glucose-Capillary: 163 mg/dL — ABNORMAL HIGH (ref 70–99)

## 2019-03-26 MED ORDER — LACTATED RINGERS IV BOLUS
1000.0000 mL | Freq: Once | INTRAVENOUS | Status: AC
  Administered 2019-03-26: 1000 mL via INTRAVENOUS

## 2019-03-26 MED ORDER — ACETAMINOPHEN 10 MG/ML IV SOLN
1000.0000 mg | Freq: Once | INTRAVENOUS | Status: AC
  Filled 2019-03-26: qty 100

## 2019-03-26 MED ORDER — HYDROCORTISONE NA SUCCINATE PF 100 MG IJ SOLR
50.0000 mg | Freq: Four times a day (QID) | INTRAMUSCULAR | Status: DC
  Administered 2019-03-26 – 2019-03-30 (×15): 50 mg via INTRAVENOUS
  Filled 2019-03-26 (×15): qty 2

## 2019-03-26 MED ORDER — PHENYLEPHRINE CONCENTRATED 100MG/250ML (0.4 MG/ML) INFUSION SIMPLE
0.0000 ug/min | INTRAVENOUS | Status: DC
  Administered 2019-03-26: 180 ug/min via INTRAVENOUS
  Administered 2019-03-26 (×2): 300 ug/min via INTRAVENOUS
  Administered 2019-03-27: 250 ug/min via INTRAVENOUS
  Filled 2019-03-26 (×5): qty 250

## 2019-03-26 MED ORDER — VITAL AF 1.2 CAL PO LIQD
1000.0000 mL | ORAL | Status: DC
  Administered 2019-03-26 – 2019-03-28 (×2): 1000 mL

## 2019-03-26 NOTE — Progress Notes (Signed)
Nutrition Follow-up  RD working remotely.  DOCUMENTATION CODES:   Obesity unspecified  INTERVENTION:    Increase Vital AF 1.2 to 70 ml/hr via NG tube  D/C Pro-stat  Provides 2016 kcal, 126 gm protein, 1362 ml free water daily  NUTRITION DIAGNOSIS:   Increased nutrient needs related to acute illness (COVID PNA) as evidenced by estimated needs.  Ongoing, being addressed via TF  GOAL:   Patient will meet greater than or equal to 90% of their needs  Met via TF  MONITOR:   Vent status, Labs, Weight trends, TF tolerance, I & O's  ASSESSMENT:   Pt with PMH of hypothyroidism, HTN, intellectual disability who lives in a group home. Pt admitted with AMS, poor oral intake, and COVID-19 PNA.  Patient extubated 12/14. Remains NPO, not ready for swallow evaluation. NG tube in place. Pressors started today. High risk for re-intubation.  EDW: 85 kg (admit weight)  Per RN edema assessment, pt with non pitting edema to BUE and BLE, as well as mild pitting generalized edema.  Current TF: Vital AF 1.2 @ 50 ml/hr, Pro-stat 30 ml BID Tolerating TF without difficulty  Medications reviewed and include: colace, novolog, vitamin C, zinc sulfate, phenylephrine.   Labs reviewed. CBG's: [id number]  I/O's: +11.6 L since admit  NUTRITION - FOCUSED PHYSICAL EXAM:  Unable to complete at this time. RD working remotely.  Diet Order:   Diet Order            Diet NPO time specified  Diet effective now              EDUCATION NEEDS:   No education needs have been identified at this time  Skin:  Reviewed RN assessment  Last BM:  12/15  Height:   Ht Readings from Last 1 Encounters:  03/12/19 '5\' 2"'$  (1.575 m)    Weight:   Wt Readings from Last 1 Encounters:  03/25/19 87.9 kg    Ideal Body Weight:  50 kg  BMI:  Body mass index is 35.44 kg/m.  Estimated Nutritional Needs:   Kcal:  1725-2150  Protein:  100-125 gm  Fluid:  >/= 1.8 L    Molli Barrows, RD,  LDN, [place] Pager [phone] After Hours Pager [phone]

## 2019-03-26 NOTE — Progress Notes (Signed)
[facility] Progress Note Patient Name: Rebecca Bruce DOB: Nov 12, 1960 MRN: [id number]   Date of Service  03/26/2019  HPI/Events of Note  Needs something for pain ahead of inserting arterial line, needs order for PRN nasotracheal suction.  eICU Interventions  Iv Tylenol 1000 mg iv x 1 ordered, NT suction order entered.        Kerry Kass Makinzee Durley 03/26/2019, 9:51 PM

## 2019-03-26 NOTE — Progress Notes (Signed)
OT Cancellation Note  Patient Details Name: Chrysta Izzard MRN: [id number] DOB: 03-Mar-1961   Cancelled Treatment:    Reason Eval/Treat Not Completed: Patient not medically ready(lox BP; Neo started.)  Couper [place],HILLARY 03/26/2019, 9:08 AM

## 2019-03-26 NOTE — Progress Notes (Signed)
SLP Cancellation Note  Patient Details Name: Rebecca Bruce MRN: [id number] DOB: Mar 03, 1961   Cancelled eval:      Pt not medically ready/responsive for swallow assessment.  Will continue efforts. Has NG for nutrition.  Rebecca Bruce L. Tivis Ringer, [place] CCC/SLP Acute Rehabilitation Services Office number [phone]                                                                                            Juan Quam Laurice 03/26/2019, 2:56 PM

## 2019-03-26 NOTE — Progress Notes (Addendum)
[facility] PCCM Evening Rounds  Neosynephrine requirements up again Will wake to stimulation like ealier today Pulses intact radially Extremities cool but not mottled K slightly elevated> adrenal insufficiency?  Will order 1L LR bolus Arterial line Lactic acid Cortisol level hydrocortisone  Close monitoring in ICU  [name] Awkward, MD [place] PCCM Pager: [phone] Cell: [phone] If no response, call [phone]

## 2019-03-26 NOTE — Progress Notes (Signed)
PT Cancellation Note  Patient Details Name: Rebecca Bruce MRN: [id number] DOB: 1960-09-04   Cancelled Treatment:    Reason Eval/Treat Not Completed: Patient not medically ready. Will follow-up for PT re-evaluation.  Mabeline Caras, PT, DPT Acute Rehabilitation Services  Pager [phone] Office [place] 03/26/2019, 10:00 AM

## 2019-03-27 ENCOUNTER — Inpatient Hospital Stay (HOSPITAL_COMMUNITY): Payer: Medicare Other

## 2019-03-27 LAB — GLUCOSE, CAPILLARY
Glucose-Capillary: 161 mg/dL — ABNORMAL HIGH (ref 70–99)
Glucose-Capillary: 192 mg/dL — ABNORMAL HIGH (ref 70–99)
Glucose-Capillary: 198 mg/dL — ABNORMAL HIGH (ref 70–99)
Glucose-Capillary: 135 mg/dL — ABNORMAL HIGH (ref 70–99)
Glucose-Capillary: 157 mg/dL — ABNORMAL HIGH (ref 70–99)

## 2019-03-27 LAB — MISC LABCORP TEST (SEND OUT): Labcorp test code: 9985

## 2019-03-27 LAB — CORTISOL: Cortisol, Plasma: 65.6 ug/dL

## 2019-03-27 IMAGING — DX DG ABD PORTABLE 1V
1 series · 1 of 1 positions shown · non-contrast
Comparison: None.

CLINICAL DATA: Check gastric catheter placement

EXAM:
PORTABLE ABDOMEN - 1 VIEW

[abdomen kub]
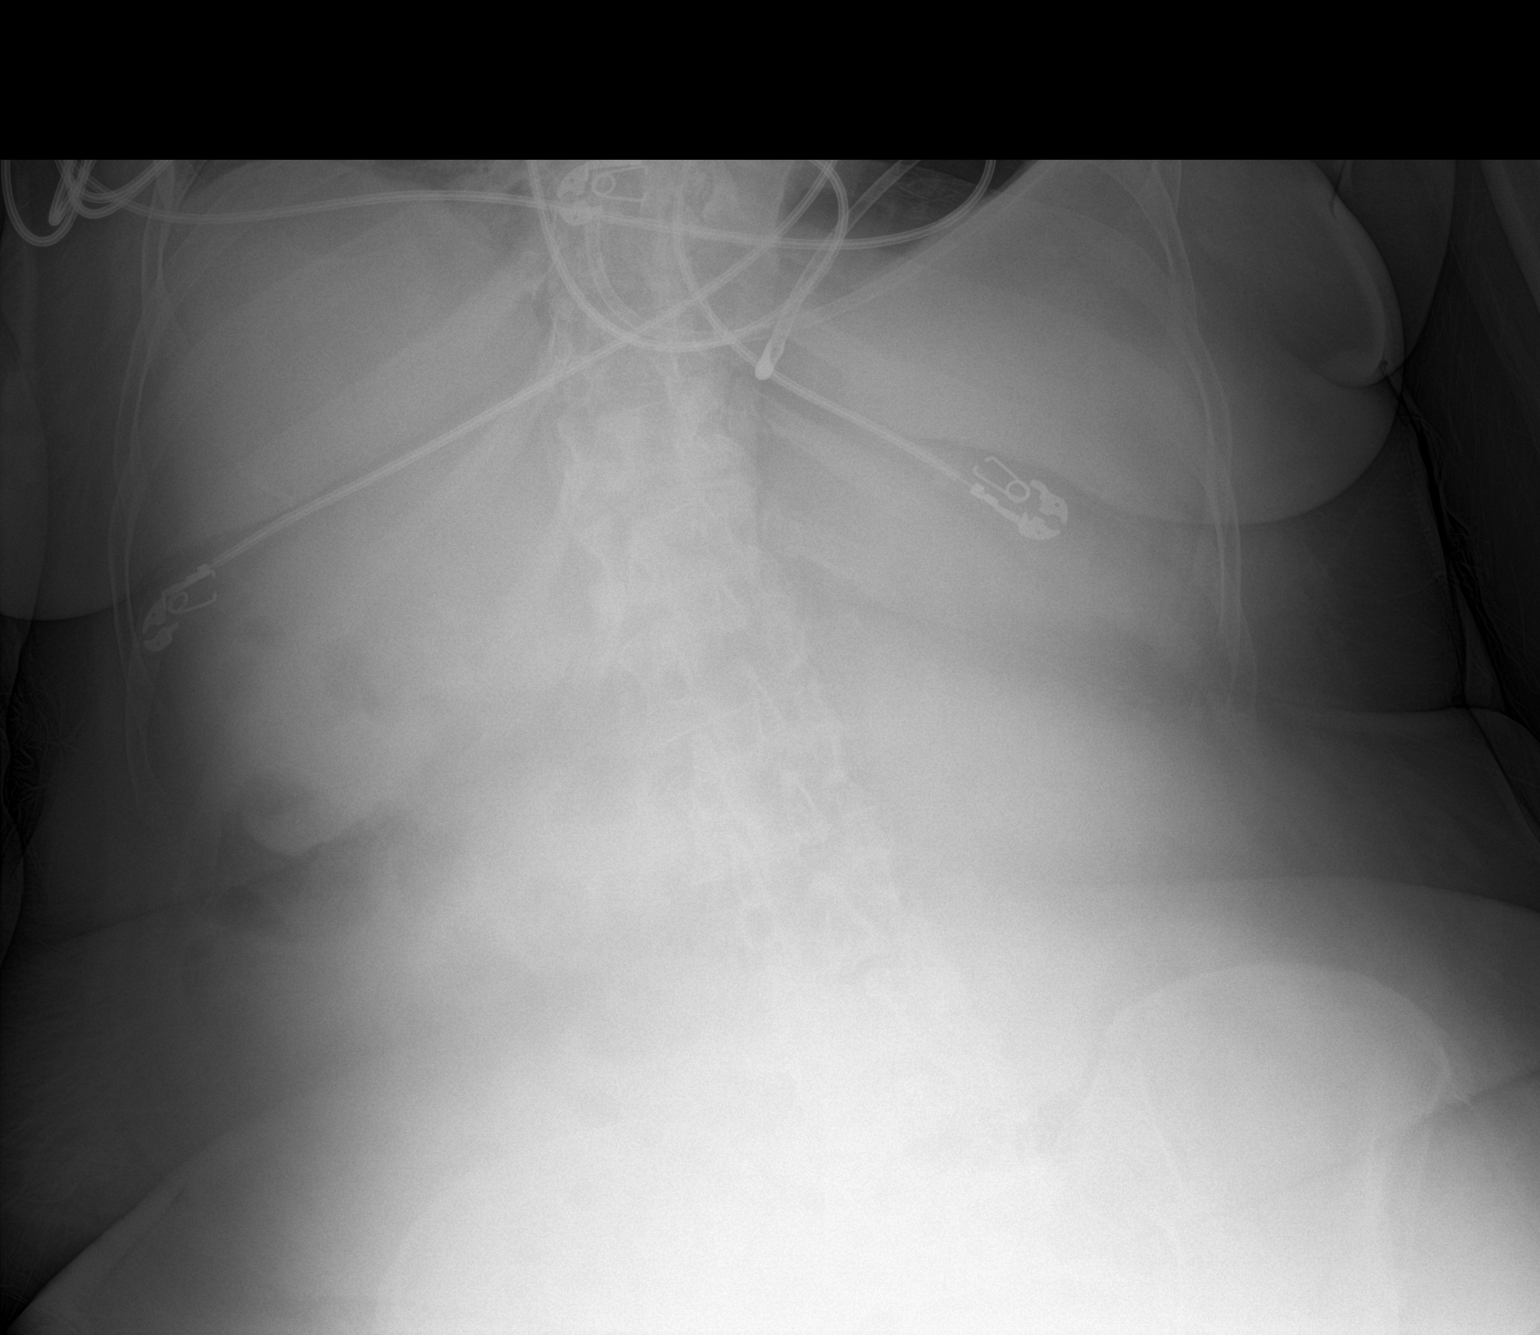

[1 of 1 positions shown; findings below may reference images not displayed]

FINDINGS: Feeding catheter is noted coiled within the gastric bubble. No
obstructive changes are seen. No acute bony abnormality is noted.
IMPRESSION: Feeding catheter coiled within the stomach.

## 2019-03-27 NOTE — Evaluation (Signed)
Physical Therapy re- Evaluation Patient Details Name: Rebecca Bruce MRN: [id number] DOB: 23-Jul-1960 Today's Date: 03/27/2019   History of Present Illness  Pt is a 58 y.o. female admitted from group home to [facility] on 03/09/19 with AMS, poor oral intake; (+) COVID-19. Pt with progressive increase in acute encephalopathy . 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 Elevated CSF protein is compatible with but not diagnostic of a resolving viral encephalitis.  onia. Brain MRI 12/3 without acute abnormality. ETT 12/4-12/14. PMH includes intellectual disability, HTN.  Clinical Impression  The patient is awake, does appear to look at therapists, not focusing as eyes dart around, does not appear to be tracking.  Patient's tone/posturing is much less than previous encounter where legs were severely extendiing. Today, able  To perform PROM of the legs, able to flex hips and knees.. Both ankles tending to plantar flex with more difficulty getting PROM into dorsiflexion. Continue  Mobility, attempt bedside sitting next visit if able . Patient left in bed chair position with pillows under UE's. Patient on RA with SPO2 in 90's throughout.     Follow Up Recommendations SNF    Equipment Recommendations  Wheelchair cushion (measurements PT);Wheelchair (measurements PT)    Recommendations for Other Services       Precautions / Restrictions Precautions Precautions: Fall;Other (comment)      Mobility  Bed Mobility Overal bed mobility: Needs Assistance Bed Mobility: Rolling Rolling: +2 for physical assistance;+2 for safety/equipment;Total assist         General bed mobility comments: placed on upright bed chair position and worked on upper trunk rotation to facilitate  head turning to the left. Increased tone of shoulders at times. Placed in bed chair position with patient maintaining head near midline.  Transfers                    Ambulation/Gait                Stairs            Wheelchair Mobility    Modified Rankin (Stroke Patients Only)       Balance     Sitting balance-Leahy Scale: Zero                                       Pertinent Vitals/Pain Faces Pain Scale: Hurts little more Pain Location: With PROM, with noxious stimuli Pain Descriptors / Indicators: Grimacing Pain Intervention(s): Monitored during session    Home Living Family/patient expects to be discharged to:: Skilled nursing facility                      Prior Function Level of Independence: Independent         Comments: Pt walked without an AD; able to bath and dress herself; pt loves to vacuum; sweep; clean adn dance; has a boyfriend named Psychologist, occupational        Extremity/Trunk Assessment   Upper Extremity Assessment Upper Extremity Assessment: Defer to OT evaluation    Lower Extremity Assessment RLE Deficits / Details: no volitional movement, upgoing toes and withdrawal to DPS. > extensor one is not present, able to  Passively flex hip and knee when supine, Adductors are more relaxed. LLE Deficits / Details: same as right    Cervical / Trunk Assessment Cervical / Trunk Exceptions: head tends to rotate and lateral to the left with increased tone, passively placed pressure to left side of head and able to slowly stretch so the head was in more neutral and remained  in the position, ? actively turned to left slightly volitionally.  Communication   Communication: (did  appear to have a head nod x 2)  Cognition Arousal/Alertness: Awake/alert Behavior During Therapy: Flat affect Overall Cognitive Status: Impaired/Different from baseline                                 General  Comments: does not follow commands, turns eyes and head a little when therapists calls name or moves around patient.      General Comments General comments (skin integrity, edema, etc.): listing to the right when [facility] elevated/bed chair position    Exercises General Exercises - Lower Extremity Ankle Circles/Pumps: PROM;Both;10 reps Heel Slides: PROM;Both;5 reps;Supine Hip ABduction/ADduction: PROM;Both;5 reps;Supine   Assessment/Plan    PT Assessment Patient needs continued PT services  PT Problem List Decreased mobility;Decreased safety awareness;Impaired tone;Decreased range of motion;Decreased coordination;Decreased knowledge of precautions;Decreased activity tolerance;Decreased cognition;Decreased balance;Decreased knowledge of use of DME       PT Treatment Interventions Therapeutic activities;Cognitive remediation;Therapeutic exercise;Patient/family education;Balance training;Neuromuscular re-education;Functional mobility training    PT Goals (Current goals can be found in the Care Plan section)  Acute Rehab PT Goals Patient Stated Goal: pt unable PT Goal Formulation: Patient unable to participate in goal setting Time For Goal Achievement: 04/10/19 Potential to Achieve Goals: Fair    Frequency Min 2X/week   Barriers to discharge        Co-evaluation PT/OT/SLP Co-Evaluation/Treatment: Yes Reason for Co-Treatment: Complexity of the patient's impairments (multi-system involvement);For patient/therapist safety PT goals addressed during session: Mobility/safety with mobility OT goals addressed during session: ADL's and self-care       AM-PAC PT "6 Clicks" Mobility  Outcome Measure Help needed turning from your back to your side while in a flat bed without using bedrails?: Total Help needed moving from lying on your back to sitting on the side of a flat bed without using bedrails?: Total Help needed moving to and from a bed to a chair (including a wheelchair)?: Total Help  needed standing up from a chair using your arms (e.g., wheelchair or bedside chair)?: Total Help needed to walk in hospital room?: Total Help needed climbing 3-5 steps with a railing? : Total 6 Click Score: 6    End of Session   Activity Tolerance: Patient tolerated treatment well Patient left: in bed;with nursing/sitter in room Nurse Communication: Mobility status;Need for lift equipment PT Visit Diagnosis: Other symptoms and signs involving the nervous system [id number])    Time: 1202-1230 PT Time Calculation (min) (ACUTE ONLY): 28 min   Charges:   PT Evaluation $PT Re-evaluation: 1 Re-eval          [place] Pager [phone] Office [phone]   [name] 03/27/2019, 2:27 PM

## 2019-03-27 NOTE — Progress Notes (Signed)
NAME:  Rebecca Bruce, MRN:  [id number], DOB:  [birth year], LOS: 51 ADMISSION DATE:  03/11/2019, CONSULTATION DATE:  12/4 REFERRING MD:  Sloan Leiter, CHIEF COMPLAINT:  Inability to protect airway   Brief History   58 y/o female brought to [facility] on 11/28 in the setting of altered mental status from her group home.  She was found to have COVID pneumonia, treated with decadron and remdesivir then sent to [facility] on 11/30.  Here has had progressive increase in acute encephalopathy and has become less and less responsive.  12/4 PCCM asked to see her because she reached the point that she was aspirating and not responding to pharyngeal/tracheal suctioning.  By report her exam findings have waxed and waned over the last three days but have not generally moved in the right direction.  Neurology saw her on 12/2 and apparently at that time she followed some simple commands, but she was non-verbal at the time with significant rigidity noted in her upper extremities.   At baseline lives in a group home, can bathe herself and have a conversation.  Has baseline anxiety and depression.  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   Past Medical History  Thyroid disease Hypertension GERD Anxiety  Depression [place] [facility] Events   11/28 Admission [facility] 11/30 Transfer to [facility] 12/04 Intubation for inability to protect airway 12/05 Diprivan, fentanyl gtt. Suctioned large plug by RT >> Peak pressure improved.  Tol SBT 12/12 Tx to [place]. Sedation decreased but put back on fentanyl drip overnight for tachycardia.  Failed SBT 12/13 PSV wean, mental status barrier to extubation 12/14: Extubated.  During the evening hours patient hypotensive.  This was after receiving propranolol placed on Neo-Synephrine and administered albumin 12/15: phenylephrine doses increasing.  12/16: pressor demands down after volume, stress steroids, and  art line placement.   Consults:  Neurology  Procedures:  12/4 ETT >>12/15  Significant Diagnostic Tests:  11/28 CT head > mucosal thickening in ethmoid air cells, study otherwise unremarkable  12/03 MRI Brain > normal MRI brain  12/03 EEG > mod to severe diffuse encephalopathy, non-specific etiology but could be due to toxic metabolic causes  Micro Data:  11/27 urine culture > multiple species 11/28 blood > negative 11/28 SARS COV 2 > positive 12/04 sputum > normal flora  Antimicrobials:  11/28 Vanc > 11/30, restart 12/2 > 12/7 11/28 mero >  12/2 11/29 remdesivir > 12/3 12/02 ceftriaxone > 12/7 12/04 flagyl > off  Interim history/subjective:  Remains on 2L [place] Seems comfortable Phenylephrine weaning quickly this morning after the additions of stress steroids and IVF bolus last night. BP cuff mostly accurate with new art line.   Objective   Blood pressure 140/61, pulse 77, temperature 99.2 F (37.3 C), temperature source Axillary, resp. rate (!) 24, height 5\' 2"  (1.575 m), weight 87.9 kg, SpO2 97 %.        Intake/Output Summary (Last 24 hours) at 03/27/2019 1148 Last data filed at 03/27/2019 1000 Gross per 24 hour  Intake 1288.7 ml  Output 865 ml  Net 423.7 ml   Filed Weights   03/22/19 0500 03/24/19 0449 03/25/19 0223  Weight: 88.8 kg 88.9 kg 87.9 kg    Examination: General:  Obese female in NAD Neuro:  Seems alert to voice, but no verbal response and does not follow commands.  HEENT:  [place]/AT, No JVD noted, PERRL Cardiovascular:  RRR, no MRG Lungs:  Clear bilateral breath sounds Abdomen:  Soft, non-distended, non-tender Musculoskeletal:  No acute deformity Skin:  Intact, MMM   Resolved Hospital Problem list     Assessment & Plan:   Acute hypoxic respiratory failure from COVID pneumonia: Successfully extubated on 12/14, but remains an aspiration risk Plan Supplemental oxygen N.p.o. status Mobilization as tolerated  Persistent encephalopathy with  catatonia, has history of underlying mental retardation EEG, MRI benign. Has been evaluated by neurology. Only workup remaining is for stiff person sydrome and anti-GAD antibodies are pending as below.  Plan Awaiting anti-- GAD 65 antibody Oligoclonal bands also pending Continuing supportive care  Minimize sedation PT  Hypotension -Etiology not entirely clear,Had been on Inderal, not currently meeting SIRS criteria Plan Continue Neo-Synephrine for mean arterial pressure goal greater than 65 Continue steroids  Hypothyroidism Plan Continue Synthroid  Best practice:  Diet: tube feeding DVT prophylaxis: lovenox per protocol GI prophylaxis: famotidine Mobility:OOB Code Status: full Disposition: ICU  Family: Rebecca Bruce) updated via phone 12/16 on patient status.   Family indicates she is "frequently afraid and will sit staring off into space and not answer".  Hassan Bruce indicates we should try to tell her that "[name] is coming to get her and she needs to get up".    Remains high risk for re-intubation    Georgann Housekeeper, AGACNP-BC [place] for personal pager PCCM on call pager [phone]  03/27/2019 11:56 AM

## 2019-03-27 NOTE — Progress Notes (Signed)
Occupational Therapy Re-Evaluation Patient Details Name: Rebecca Bruce MRN: [id number] DOB: 05-28-1960 Today's Date: 03/27/2019    History of Present Illness Pt is a 58 y.o. female admitted from group home to [facility] on 03/09/19 with AMS, poor oral intake; (+) COVID-19. Pt with progressive increase in acute encephalopathy . 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 Elevated CSF protein is compatible with but not diagnostic of a resolving viral encephalitis.  onia. Brain MRI 12/3 without acute abnormality. ETT 12/4-12/14. PMH includes intellectual disability, HTN.   Clinical Impression   Pt able to tolerate bed chair position this date, noting initial right lateral lean. Pt's head turned to the left upon arrival, unable to follow cues to turn head to the right. With cues and slow stretching, pt progressed to looking to the right and occasionally tracking therapist when provided max verbal and/or visual cues. Pt unable to follow one step instructions. Pt continues to lack coordination and ROM in BUEs, noting tone and/or resistance when completing shoulder PROM. Pt's SpO2 maintained in 90s throughout, with no signs/symptoms of distress. Pt continues to demonstrate decreased cognition, ROM, GMC, FMC, strength, endurance, balance, sitting/standing tolerance, and activity tolerance impacting ability to complete self-care and functional transfer tasks. Recommend skilled OT services to address above deficits in order to promote function and prevent further decline. Recommend SNF placement for additional rehab following hospital discharge.     Follow Up Recommendations  SNF;Supervision/Assistance - 24 hour    Equipment Recommendations  None recommended by OT     Recommendations for Other Services       Precautions / Restrictions Precautions Precautions: Fall Restrictions Weight Bearing Restrictions: No      Mobility Bed Mobility Overal bed mobility: Needs Assistance Bed Mobility: Rolling Rolling: +2 for physical assistance;+2 for safety/equipment;Total assist         General bed mobility comments: Pt placed in chair position in bed. Worked on visual scanning, UE ROM, and trunk rotation.  Transfers Overall transfer level: (Not attempted this date due to safety concerns)               General transfer comment: (Not attempted this date due to safety concerns)    Balance Overall balance assessment: (EOB noted attempted this date due to safety concerns)   Sitting balance-Leahy Scale: Zero                                     ADL either performed or assessed with clinical judgement   ADL Overall ADL's : Needs assistance/impaired Eating/Feeding: NPO   Grooming: Total assistance;Bed level   Upper Body Bathing: Bed level;Total assistance   Lower Body Bathing: Total assistance;Bed level   Upper Body Dressing : Total assistance;Bed level   Lower Body Dressing: Total assistance;Bed level   Toilet Transfer: Total assistance(bed level)   Toileting- Clothing Manipulation and Hygiene: Total assistance;Bed level       Functional mobility during ADLs: (Not attempted this date. Pt unable to follow commands)       Vision         Perception     Praxis      Pertinent Vitals/Pain Pain Assessment: Faces Faces Pain Scale: Hurts little more Pain Location: With PROM,  with noxious stimuli Pain Descriptors / Indicators: Grimacing Pain Intervention(s): Monitored during session     Hand Dominance     Extremity/Trunk Assessment Upper Extremity Assessment Upper Extremity Assessment: Generalized weakness;RUE deficits/detail;LUE deficits/detail RUE Deficits / Details: No AROM. Resistance and/or tone noted  during shld PROM. Elbow, wrist, and digit PROM WFL. RUE Coordination: decreased fine motor;decreased gross motor LUE Deficits / Details: No AROM. Resistance and/or tone noted during shld PROM. Elbow, wrist, and digit PROM WFL LUE Coordination: decreased fine motor;decreased gross motor   Lower Extremity Assessment Lower Extremity Assessment: Defer to PT evaluation RLE Deficits / Details: no volitional movement, upgoing toes and withdrawal to DPS. > extensor one is not present, able to Passively flex hip and knee when supine, Adductors are more relaxed. LLE Deficits / Details: same as right   Cervical / Trunk Assessment Cervical / Trunk Assessment: Other exceptions(Head turned towards left majority of session) Cervical / Trunk Exceptions: Head turned towards left majority of session. PT able to slowly stretch neck. Pt progressing to turning and looking towards right. Head in more of a neutral alignment at end of session.   Communication Communication Communication: Other (comment)(Pt nonverbal during session)   Cognition Arousal/Alertness: Awake/alert Behavior During Therapy: Flat affect Overall Cognitive Status: Impaired/Different from baseline                                 General Comments: Pt unable to follow commands. Will visually track therapist occasionally   General Comments  Vitals stable throughout. Pt placed in chair position. EOB transfer not attempted this date due to safety concerns. Pt unable to follow one step instructions.    Exercises Exercises: General Upper Extremity(General BUE PROM to encourage AROM.) General Exercises - Lower Extremity Ankle Circles/Pumps: PROM;Both;10 reps Heel Slides: PROM;Both;5 reps;Supine Hip ABduction/ADduction: PROM;Both;5 reps;Supine   Shoulder Instructions      Home Living Family/patient expects to be discharged to:: Skilled nursing facility                                        Prior  Functioning/Environment Level of Independence: Independent        Comments: Pt walked without an AD; able to bath and dress herself; pt loves to vacuum; sweep; clean adn dance; has a boyfriend named Vicente Serene        OT Problem List: Decreased strength;Decreased range of motion;Decreased activity tolerance;Impaired balance (sitting and/or standing);Impaired vision/perception;Decreased coordination;Decreased cognition;Decreased safety awareness;Decreased knowledge of use of DME or AE;Cardiopulmonary status limiting activity;Impaired sensation;Impaired tone;Impaired UE functional use      OT Treatment/Interventions: Self-care/ADL training;Therapeutic exercise;Neuromuscular education;Energy conservation;DME and/or AE instruction;Splinting;Therapeutic activities;Cognitive remediation/compensation;Visual/perceptual remediation/compensation;Patient/family education;Balance training    OT Goals(Current goals can be found in the care plan section) Acute Rehab OT Goals Patient Stated Goal: Pt unable to state Time For Goal Achievement: 04/10/19 Potential to Achieve Goals: Fair ADL Goals Additional ADL Goal #3: Pt to tolerate sitting edge of bed 5 min with mod assist x 1, in preparation for ADLs.  OT Frequency: Min 2X/week   Barriers to D/C:            Co-evaluation PT/OT/SLP Co-Evaluation/Treatment: Yes Reason for Co-Treatment: Complexity of the patient's impairments (multi-system involvement);For patient/therapist safety PT goals addressed during session: Mobility/safety with mobility OT goals addressed during session: ADL's and self-care;Strengthening/ROM      AM-PAC  OT "6 Clicks" Daily Activity     Outcome Measure Help from another person eating meals?: Total Help from another person taking care of personal grooming?: Total Help from another person toileting, which includes using toliet, bedpan, or urinal?: Total Help from another person bathing (including washing, rinsing, drying)?:  Total Help from another person to put on and taking off regular upper body clothing?: Total Help from another person to put on and taking off regular lower body clothing?: Total 6 Click Score: 6   End of Session Nurse Communication: Mobility status  Activity Tolerance: Patient tolerated treatment well Patient left: in bed;with nursing/sitter in room  OT Visit Diagnosis: Unsteadiness on feet (R26.81);Other abnormalities of gait and mobility (R26.89);Muscle weakness (generalized) (M62.81);Hemiplegia and hemiparesis Hemiplegia - Right/Left: (Bilateral) Hemiplegia - caused by: Unspecified                Time: 1203-1229 OT Time Calculation (min): 26 min Charges:  OT General Charges $OT Visit: 1 Visit OT Evaluation $OT Re-eval: 1 Re-eval  Mauri Brooklyn OTR/L [phone]   Mauri Brooklyn 03/27/2019, 3:22 PM

## 2019-03-27 NOTE — Progress Notes (Signed)
Attending:    Subjective: Started on vasopressors yesterday Arterial line placed Given IV fluids Started on stress dose steroids  Objective: Vitals:   03/27/19 0700 03/27/19 0730 03/27/19 0800 03/27/19 0830  BP:   140/61   Pulse: 73 65 74 77  Resp: (!) 21 (!) 28 (!) 25 (!) 24  Temp:   99.2 F (37.3 C)   TempSrc:   Axillary   SpO2: 99% 98% 97% 97%  Weight:      Height:          Intake/Output Summary (Last 24 hours) at 03/27/2019 1145 Last data filed at 03/27/2019 1000 Gross per 24 hour  Intake 1288.7 ml  Output 865 ml  Net 423.7 ml    General:  In bed on vent HENT: NCAT ETT in place PULM: CTA B, vent supported breathing CV: RRR, no mgr GI: BS+, soft, nontender MSK: normal bulk and tone Neuro: awake, tracks with eyes, nods head appropriately    CBC    Component Value Date/Time   WBC 11.8 (H) 03/25/2019 0340   RBC 4.37 03/25/2019 0340   HGB 11.6 (L) 03/25/2019 0340   HGB 13.5 01/09/2015 0809   HCT 36.6 03/25/2019 0340   HCT 40.7 01/09/2015 0809   PLT 252 03/25/2019 0340   PLT 333 01/09/2015 0809   MCV 83.8 03/25/2019 0340   MCV 81 01/09/2015 0809   MCH 26.5 03/25/2019 0340   MCHC 31.7 03/25/2019 0340   RDW 15.7 (H) 03/25/2019 0340   RDW 14.2 01/09/2015 0809   LYMPHSABS 1.8 03/21/2019 0613   MONOABS 1.4 (H) 03/21/2019 0613   EOSABS 0.0 03/21/2019 0613   BASOSABS 0.0 03/21/2019 0613    BMET    Component Value Date/Time   NA 137 03/26/2019 0705   NA 144 02/03/2016 0000   K 5.1 03/26/2019 0705   CL 105 03/26/2019 0705   CO2 21 (L) 03/26/2019 0705   GLUCOSE 112 (H) 03/26/2019 0705   BUN 26 (H) 03/26/2019 0705   BUN 11 02/03/2016 0000   CREATININE 0.87 03/26/2019 0705   CREATININE 1.00 07/13/2018 1023   CALCIUM 7.8 (L) 03/26/2019 0705   GFRNONAA >60 03/26/2019 0705   GFRNONAA 63 07/13/2018 1023   GFRAA >60 03/26/2019 0705   GFRAA 73 07/13/2018 1023      Impression/Plan:  Shock, unclear etiology, adrenal insufficiency? Hypovolemia?  Continue tube feeding, hydrocortisone, wean off neosynephrine for SBP > 90, monitor UOP and level of consciousness (currently acceptable)  AKI: monitor UOP, repeat BMET today  Catatonia: improved mental status slowly compared to last week but still minimally responsive, continue to hold sedation, frequent orientation  Rest per NP note  To [facility] tomorrow  My cc time 31 minutes  [name] Awkward, MD [place] [facility] Pager: [phone] Cell: [phone] After 3pm or if no response, call [phone]

## 2019-03-27 NOTE — Procedures (Addendum)
Cortrak  Person Inserting Tube:  Maylon Peppers C, RD Tube Type:  Cortrak - 43 inches Tube Location:  Left nare Initial Placement:  Stomach Secured by: Bridle Technique Used to Measure Tube Placement:  Documented cm marking at nare/ corner of mouth Cortrak Secured At:  73 cm    Cortrak Tube Team Note:  Consult received to place a Cortrak feeding tube.   X-ray is required, abdominal x-ray has been ordered by the Cortrak team. Please confirm tube placement before using the Cortrak tube. -- Per xray tube ready for use.  If the tube becomes dislodged please keep the tube and contact the Cortrak team at [email or web address] (password [email or web address]) for replacement.  If after hours and replacement cannot be delayed, place a NG tube and confirm placement with an abdominal x-ray.    [place], [place], [place] Pager [phone] After Hours Pager

## 2019-03-27 NOTE — Evaluation (Signed)
Clinical/Bedside Swallow Evaluation Patient Details  Name: Rebecca Bruce MRN: [id number] Date of Birth: 29-Oct-1960  Today's Date: 03/27/2019 Time: SLP Start Time (ACUTE ONLY): 1225 SLP Stop Time (ACUTE ONLY): 1235 SLP Time Calculation (min) (ACUTE ONLY): 10 min  Past Medical History:  Past Medical History:  Diagnosis Date  . GERD (gastroesophageal reflux disease)   . Hypertension   . Thyroid disease    Past Surgical History:  Past Surgical History:  Procedure Laterality Date  . DENTAL SURGERY     HPI:  58 y.o. female with PMHx of hypothyroidism, HTN, intellectual disability-lives in a group home for the past 20 years (normally fairly independent and talks)-brought in to [facility] on 11/28 for altered mental status, poor oral intake-further evaluation revealed COVID-19 pneumonia.  Encephalopathic, leading to clinical swallow evaluation 12/01 - noted that pt visually tracked people in room; did not initiate movement or recognize/manipulate POs.  Pt was intubated 12/4-14.  Dx include severe catatonic reaction of uncertain etiology, ARF with hypoxemia.   Assessment / Plan / Recommendation Clinical Impression  Pt seen for brief assessment after OT/PT worked with her and moved her into chair position.  Pt alert, looking around the room and occasionally at examiner but not tracking from side-to-side nor following any commands. Pt did nod head occasionally in response to questions about comfort.  There were no spontaneous vocalizations during session.  Provided oral care -pt with some active lip movement; no spontaneous/reflexive swallow was elicited.  No sign of recognition of approaching spoon nor active effort to retrieve ice from spoon was observed.  Evaluation discontinued.  Continue NPO. SLP will follow for improvements in MS, readiness for intervention.  Pt had cortrak placed today. SLP Visit Diagnosis: Dysphagia, unspecified (R13.10)         Diet Recommendation   NPO  Medication  Administration: Via alternative means    Other  Recommendations Oral Care Recommendations: Oral care QID   Follow up Recommendations Skilled Nursing facility      Frequency and Duration min 2x/week  3 weeks       Prognosis Prognosis for Safe Diet Advancement: Fair Barriers to Reach Goals: Cognitive deficits      Swallow Study   General HPI: 58 y.o. female with PMHx of hypothyroidism, HTN, intellectual disability-lives in a group home for the past 20 years (normally fairly independent and talks)-brought in to [facility] on 11/28 for altered mental status, poor oral intake-further evaluation revealed COVID-19 pneumonia.  Encephalopathic, leading to clinical swallow evaluation 12/01 - noted that pt visually tracked people in room; did not initiate movement or recognize/manipulate POs.  Pt was intubated 12/4-14.  Dx include severe catatonic reaction of uncertain etiology, ARF with hypoxemia. Type of Study: Bedside Swallow Evaluation Previous Swallow Assessment: see HPI Diet Prior to this Study: NPO;NG Tube Temperature Spikes Noted: No Respiratory Status: Nasal cannula History of Recent Intubation: Yes Length of Intubations (days): 10 days Date extubated: 03/25/19 Behavior/Cognition: Alert;Doesn't follow directions Oral Cavity Assessment: Within Functional Limits Oral Care Completed by SLP: Yes Oral Cavity - Dentition: Adequate natural dentition Self-Feeding Abilities: Total assist Patient Positioning: Upright in bed Baseline Vocal Quality: Not observed Volitional Cough: Cognitively unable to elicit Volitional Swallow: Unable to elicit    Oral/Motor/Sensory Function Overall Oral Motor/Sensory Function: Other (comment)(symmetry at baseline; no f/c)   Ice Chips Ice chips: Impaired Presentation: Spoon Oral Phase Impairments: Poor awareness of bolus   Thin Liquid Thin Liquid: Not tested    Nectar Thick Nectar Thick Liquid: Not tested  Honey Thick Honey Thick Liquid: Not tested   Puree  Puree: Not tested   Solid    Shawntel Farnworth L. Samson Frederic, MA CCC/SLP Acute Rehabilitation Services Office number [phone]   Solid: Not tested      Blenda Mounts Laurice 03/27/2019,1:49 PM

## 2019-03-28 ENCOUNTER — Inpatient Hospital Stay (HOSPITAL_COMMUNITY): Payer: Medicare Other

## 2019-03-28 ENCOUNTER — Encounter (HOSPITAL_COMMUNITY): Payer: Self-pay | Admitting: General Surgery

## 2019-03-28 DIAGNOSIS — N179 Acute kidney failure, unspecified: Secondary | ICD-10-CM

## 2019-03-28 DIAGNOSIS — K7200 Acute and subacute hepatic failure without coma: Secondary | ICD-10-CM

## 2019-03-28 DIAGNOSIS — L8990 Pressure ulcer of unspecified site, unspecified stage: Secondary | ICD-10-CM | POA: Insufficient documentation

## 2019-03-28 LAB — MAGNESIUM: Magnesium: 2.3 mg/dL (ref 1.7–2.4)

## 2019-03-28 LAB — BASIC METABOLIC PANEL
Anion gap: 11 (ref 5–15)
BUN: 73 mg/dL — ABNORMAL HIGH (ref 6–20)
CO2: 21 mmol/L — ABNORMAL LOW (ref 22–32)
Calcium: 7.5 mg/dL — ABNORMAL LOW (ref 8.9–10.3)
Chloride: 108 mmol/L (ref 98–111)
Creatinine, Ser: 1.41 mg/dL — ABNORMAL HIGH (ref 0.44–1.00)
GFR calc Af Amer: 48 mL/min — ABNORMAL LOW (ref >60)
GFR calc non Af Amer: 41 mL/min — ABNORMAL LOW (ref >60)
Glucose, Bld: 244 mg/dL — ABNORMAL HIGH (ref 70–99)
Potassium: 3.4 mmol/L — ABNORMAL LOW (ref 3.5–5.1)
Sodium: 140 mmol/L (ref 135–145)

## 2019-03-28 LAB — CBC
HCT: 12.2 % — ABNORMAL LOW (ref 36.0–46.0)
HCT: 21.6 % — ABNORMAL LOW (ref 36.0–46.0)
Hemoglobin: 3.9 g/dL — CL (ref 12.0–15.0)
Hemoglobin: 7.4 g/dL — ABNORMAL LOW (ref 12.0–15.0)
MCH: 27.1 pg (ref 26.0–34.0)
MCH: 29.5 pg (ref 26.0–34.0)
MCHC: 32 g/dL (ref 30.0–36.0)
MCHC: 34.3 g/dL (ref 30.0–36.0)
MCV: 84.7 fL (ref 80.0–100.0)
MCV: 86.1 fL (ref 80.0–100.0)
Platelets: 152 10*3/uL (ref 150–400)
Platelets: 144 10*3/uL — ABNORMAL LOW (ref 150–400)
RBC: 1.44 MIL/uL — ABNORMAL LOW (ref 3.87–5.11)
RBC: 2.51 MIL/uL — ABNORMAL LOW (ref 3.87–5.11)
RDW: 17.2 % — ABNORMAL HIGH (ref 11.5–15.5)
RDW: 14.8 % (ref 11.5–15.5)
WBC: 15.5 10*3/uL — ABNORMAL HIGH (ref 4.0–10.5)
WBC: 20.4 10*3/uL — ABNORMAL HIGH (ref 4.0–10.5)
nRBC: 2.1 % — ABNORMAL HIGH (ref 0.0–0.2)
nRBC: 4.5 % — ABNORMAL HIGH (ref 0.0–0.2)

## 2019-03-28 LAB — GLUCOSE, CAPILLARY
Glucose-Capillary: 185 mg/dL — ABNORMAL HIGH (ref 70–99)
Glucose-Capillary: 211 mg/dL — ABNORMAL HIGH (ref 70–99)
Glucose-Capillary: 206 mg/dL — ABNORMAL HIGH (ref 70–99)
Glucose-Capillary: 180 mg/dL — ABNORMAL HIGH (ref 70–99)
Glucose-Capillary: 146 mg/dL — ABNORMAL HIGH (ref 70–99)
Glucose-Capillary: 117 mg/dL — ABNORMAL HIGH (ref 70–99)

## 2019-03-28 LAB — PROTIME-INR
INR: 1.3 — ABNORMAL HIGH (ref 0.8–1.2)
Prothrombin Time: 15.7 seconds — ABNORMAL HIGH (ref 11.4–15.2)

## 2019-03-28 LAB — PREPARE RBC (CROSSMATCH)

## 2019-03-28 LAB — HEPATIC FUNCTION PANEL
ALT: 1684 U/L — ABNORMAL HIGH (ref 0–44)
AST: 588 U/L — ABNORMAL HIGH (ref 15–41)
Albumin: 2.2 g/dL — ABNORMAL LOW (ref 3.5–5.0)
Alkaline Phosphatase: 84 U/L (ref 38–126)
Bilirubin, Direct: 0.2 mg/dL (ref 0.0–0.2)
Indirect Bilirubin: 0.6 mg/dL (ref 0.3–0.9)
Total Bilirubin: 0.8 mg/dL (ref 0.3–1.2)
Total Protein: 5.1 g/dL — ABNORMAL LOW (ref 6.5–8.1)

## 2019-03-28 LAB — ABO/RH: ABO/RH(D): O POS

## 2019-03-28 IMAGING — CT CT ABD-PELV W/O CM
1 of 3 series · 13 of 32 positions shown, 17 images · non-contrast
Comparison: [DATE] chest radiograph.

CLINICAL DATA: Inpatient.  Given 19 pneumonia.  Dyspnea.  Anemia.

EXAM:
CT CHEST, ABDOMEN AND PELVIS WITHOUT CONTRAST
TECHNIQUE: Multidetector CT imaging of the chest, abdomen and pelvis was
performed following the standard protocol without IV contrast.

[Series 3: (person_name) thins · axial · 0.87mm/px · z∈[+697,+1263]mm · 13 of 916 slices shown, 17 images]
[im 71/916  soft-tissue]
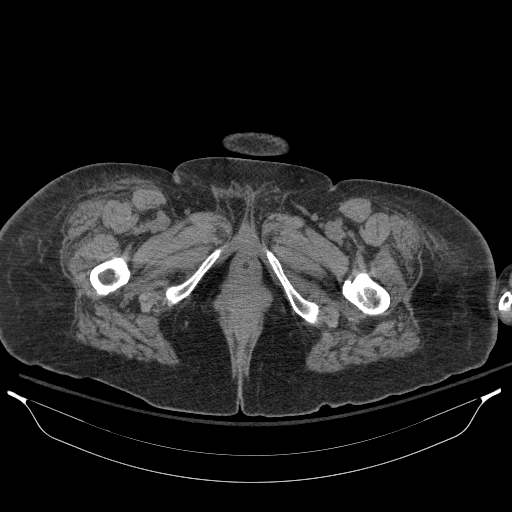
[im 71/916  bone]
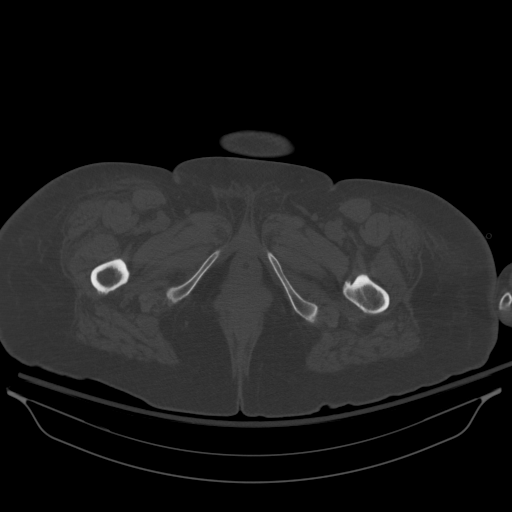
[im 141/916  soft-tissue]
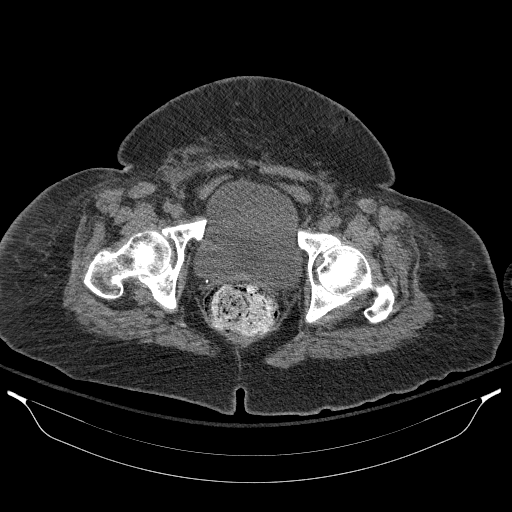
[im 212/916  soft-tissue]
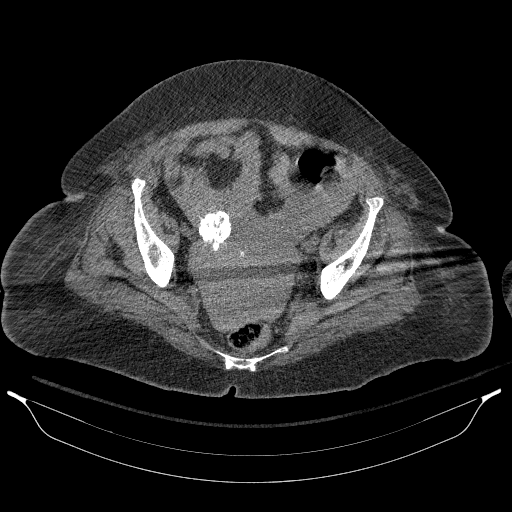
[im 282/916  soft-tissue]
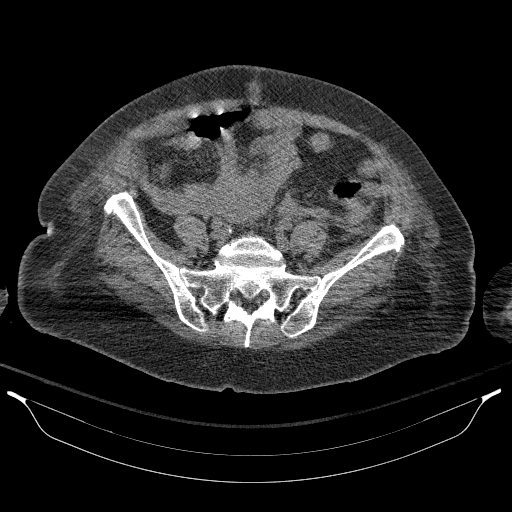
[im 388/916  soft-tissue]
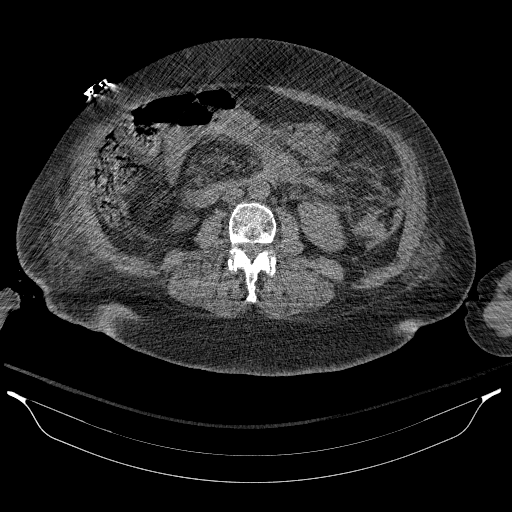
[im 458/916  soft-tissue]
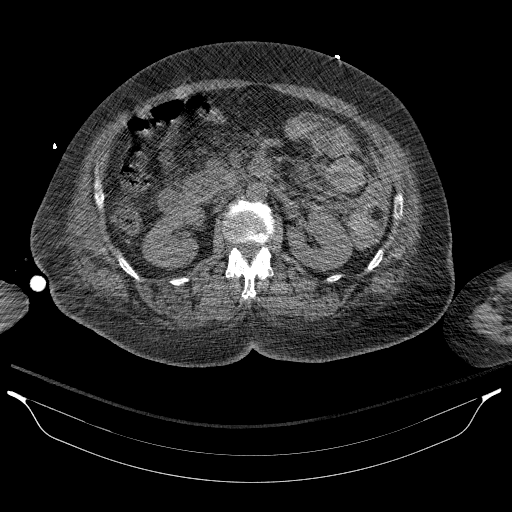
[im 528/916  soft-tissue]
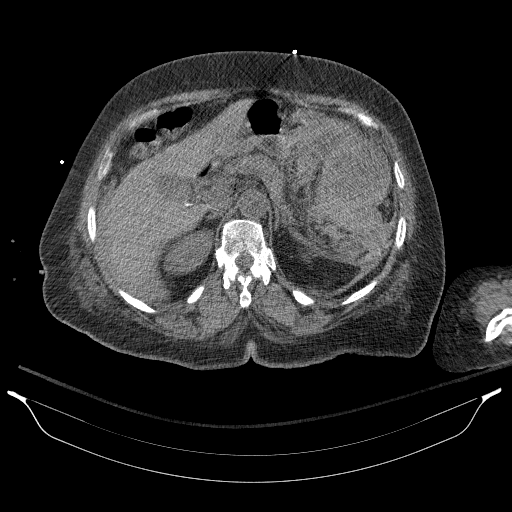
[im 634/916  soft-tissue]
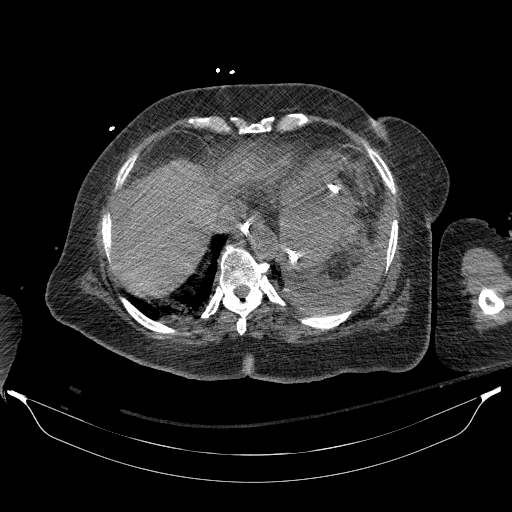
[im 704/916  soft-tissue]
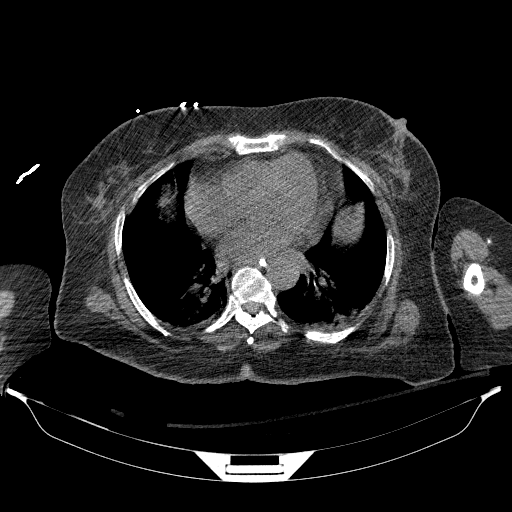
[im 704/916  bone]
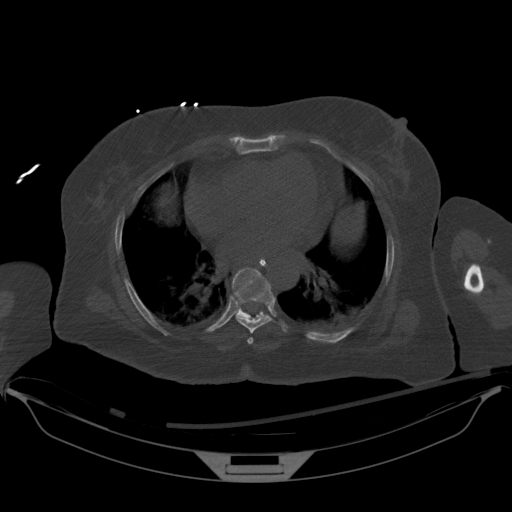
[im 775/916  soft-tissue]
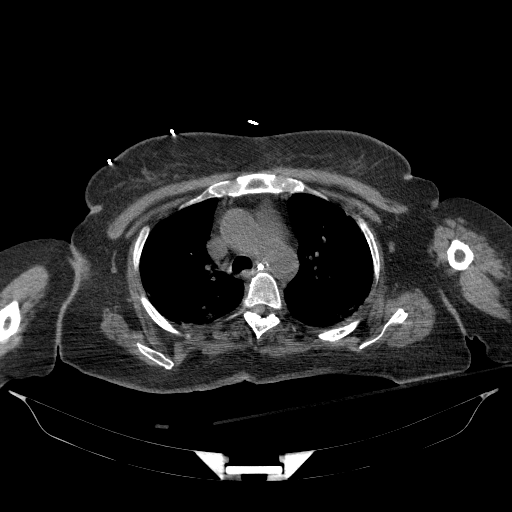
[im 775/916  lung]
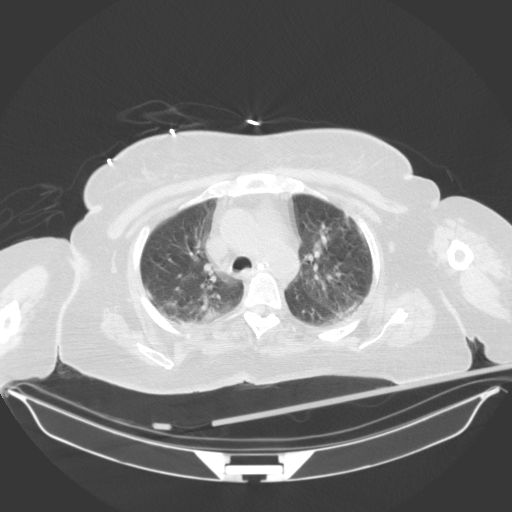
[im 810/916  lung]
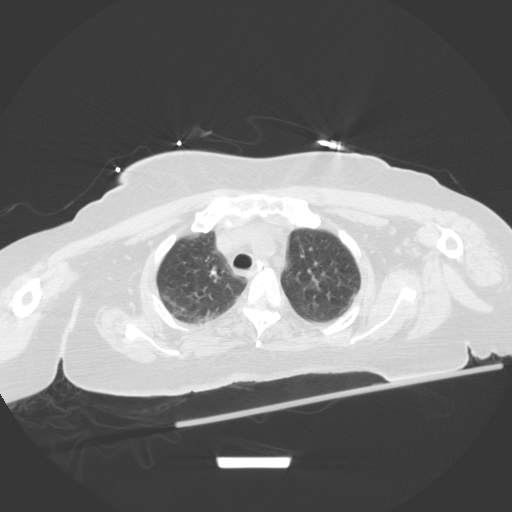
[im 845/916  soft-tissue]
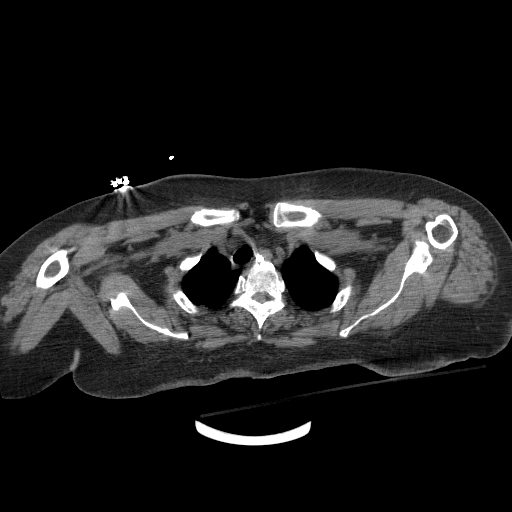
[im 845/916  lung]
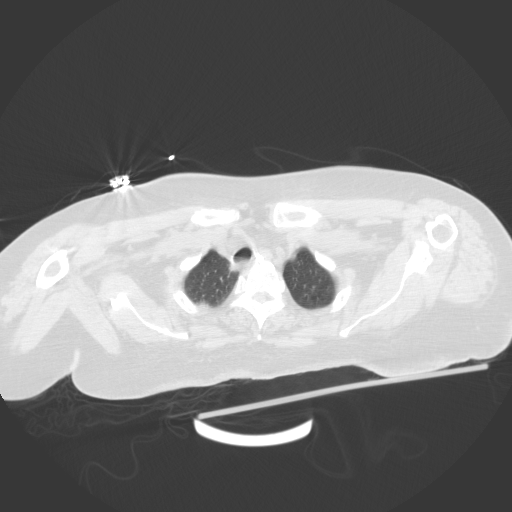
[im 880/916  lung]
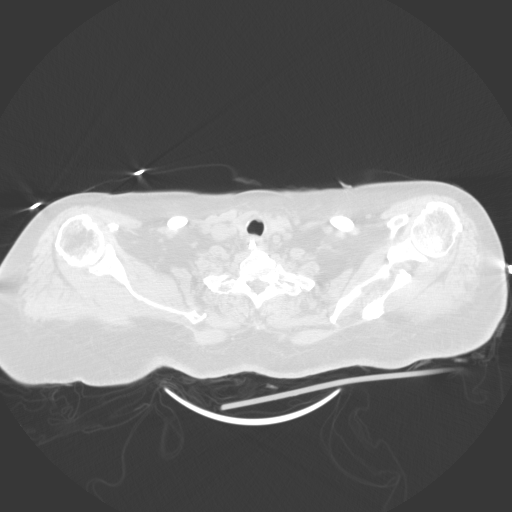

[13 of 32 positions shown; findings below may reference images not displayed]

FINDINGS: CT CHEST FINDINGS

Motion degraded scan limits assessment.

Cardiovascular: Top-normal heart size. No significant pericardial
effusion/thickening. Left anterior descending coronary
atherosclerosis. Mildly atherosclerotic nonaneurysmal thoracic
aorta. Normal caliber pulmonary arteries.

Mediastinum/Nodes: No discrete thyroid nodules. Enteric tube
terminates in body of the stomach. No pathologically enlarged
axillary, mediastinal or hilar lymph nodes, noting limited
sensitivity for the detection of hilar adenopathy on this
noncontrast study.

Lungs/Pleura: No pneumothorax. No pleural effusion. Moderate patchy
consolidation and ground-glass opacity throughout both lungs, with a
ground-glass opacities most prominent in the upper lobes and with
the consolidation most prominent in the dependent lower lobes.
Posterior left upper lobe 6 mm pulmonary nodule (series 4/image 19).
No lung masses or additional significant pulmonary nodules on this
motion degraded scan.

Musculoskeletal: No aggressive appearing focal osseous lesions. Mild
thoracic spondylosis.

CT ABDOMEN PELVIS FINDINGS

Hepatobiliary: Normal liver with no liver mass. Cholelithiasis. No
gallbladder wall thickening or pericholecystic fluid. No biliary
ductal dilatation.

Pancreas: Normal, with no mass or duct dilation.

Spleen: Normal size. No mass.

Adrenals/Urinary Tract: Normal adrenals. No renal stones. No
hydronephrosis. Simple 2.5 cm medial upper left renal cyst. No
additional contour deforming renal lesions. Foley catheter
terminates in the bladder. Tiny foci of nondependent bladder gas
compatible with instrumentation. Otherwise normal bladder.

Stomach/Bowel: Enteric tube loops in the gastric fundus with the tip
in the body of the stomach. Stomach is nondistended and is
unremarkable. Normal caliber small bowel with no small bowel wall
thickening. Candidate normal appendix in the right lower quadrant.
There is a large hemorrhagic 13.9 x 8.7 cm left upper quadrant mass
(series 2/image 52) centered in the splenic flexure of the colon
with heterogeneous hyperdensity and surrounding extensive fat
stranding. Otherwise unremarkable nondistended large bowel.

Vascular/Lymphatic: Mildly atherosclerotic nonaneurysmal abdominal
aorta. No pathologically enlarged lymph nodes in the abdomen or
pelvis.

Reproductive: There is a coarse 3.7 cm right pelvic calcification
(series 2/image 100), presumably a degenerated calcified fibroid,
poorly delineated on this noncontrast scan. No discrete adnexal
masses.

Other: No pneumoperitoneum. Moderate volume hemoperitoneum, most
prominent in the pelvis. No focal fluid collections. Mild body wall
anasarca.

Musculoskeletal: No aggressive appearing focal osseous lesions.
IMPRESSION: 1. Large 13.9 x 8.7 cm hemorrhagic left upper quadrant mass centered
in the splenic flexure of the colon with moderate volume
hemoperitoneum, most prominent in the pelvis. Acute hemorrhage
arising from an underlying colonic neoplasm not excluded.
2. No free air.  No findings of bowel obstruction.
3. Moderate patchy ground-glass opacity and consolidation in both
lungs, with an appearance compatible with [YE] pneumonia.
4. Left upper lobe 6 mm solid pulmonary nodule. Non-contrast chest
CT at 6-12 months is recommended. If the nodule is stable at time of
repeat CT, then future CT at 18-24 months (from today's scan) is
considered optional for low-risk patients, but is recommended for
high-risk patients. This recommendation follows the consensus
statement: Guidelines for Management of Incidental Pulmonary Nodules
Detected on CT Images:From the [HOSPITAL] [YE]; published
online before print (10.1148/radiol.[PHONE_NUMBER]).
5. One vessel coronary atherosclerosis.
6. Cholelithiasis.
7. Enteric tube terminates in the body of the stomach.
8.  Aortic Atherosclerosis ([YE]-[YE]).

Critical Value/emergent results were called by telephone at the time
POPOV, who verbally acknowledged these results.

## 2019-03-28 IMAGING — CT CT HEAD W/O CM
2 of 6 series · 14 of 30 positions shown, 17 images · non-contrast
Comparison: [DATE]

CLINICAL DATA: Encephalopathy

EXAM:
CT HEAD WITHOUT CONTRAST
TECHNIQUE: Contiguous axial images were obtained from the base of the skull
through the vertex without intravenous contrast.

[Series 3: head w/o thins · axial · non-contrast · 0.43mm/px · z∈[+1375,+1495]mm · 12 of 120 slices shown, 15 images]
[im 10/120  brain]
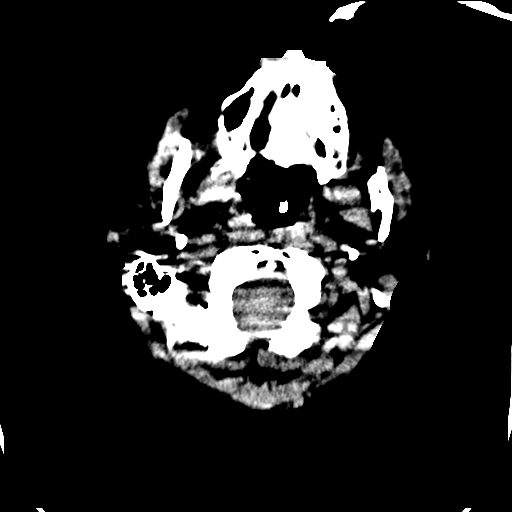
[im 10/120  bone]
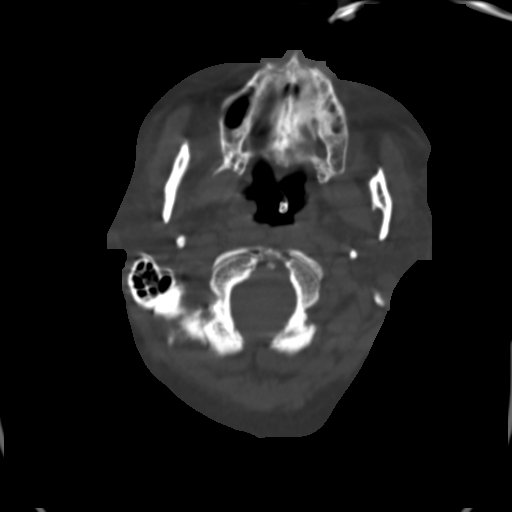
[im 19/120  brain]
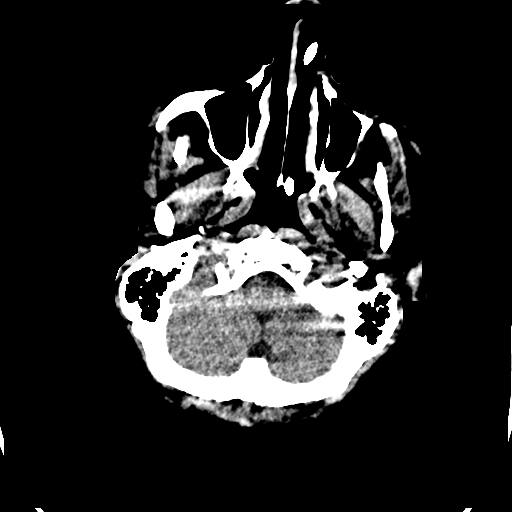
[im 28/120  brain]
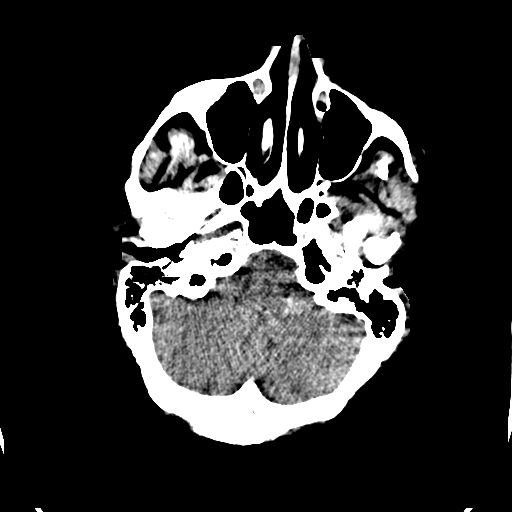
[im 37/120  brain]
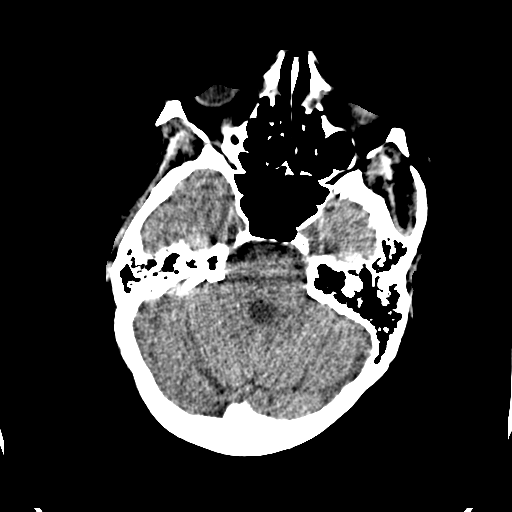
[im 46/120  brain]
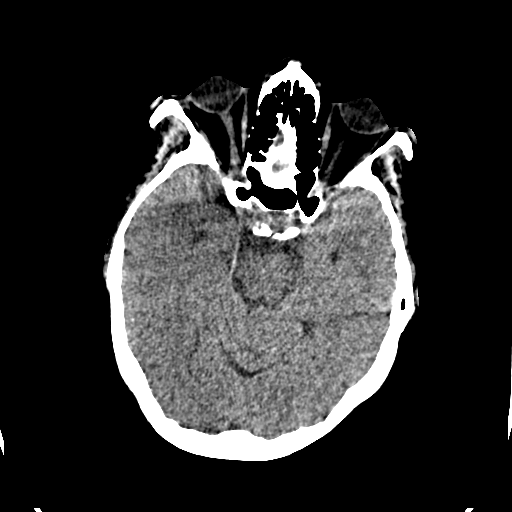
[im 46/120  bone]
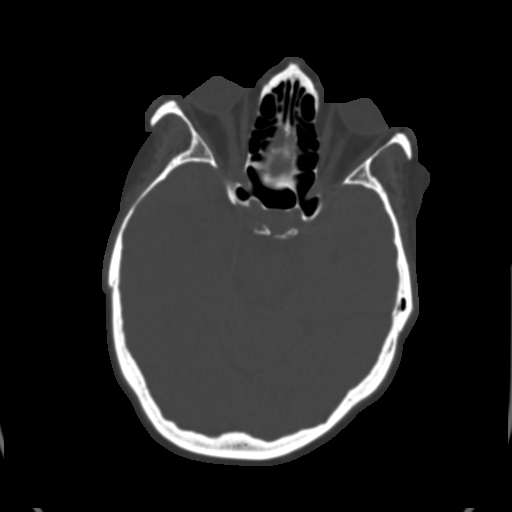
[im 55/120  brain]
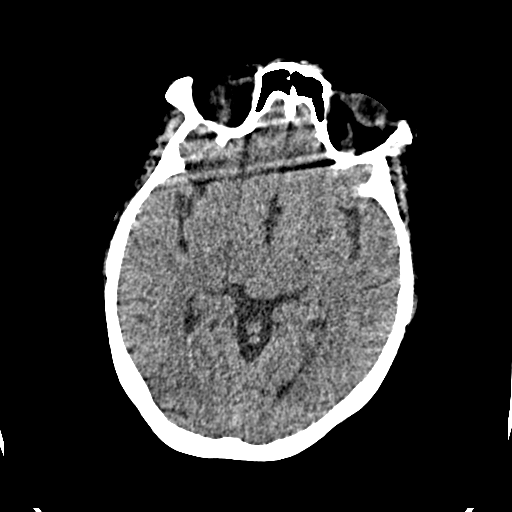
[im 65/120  brain]
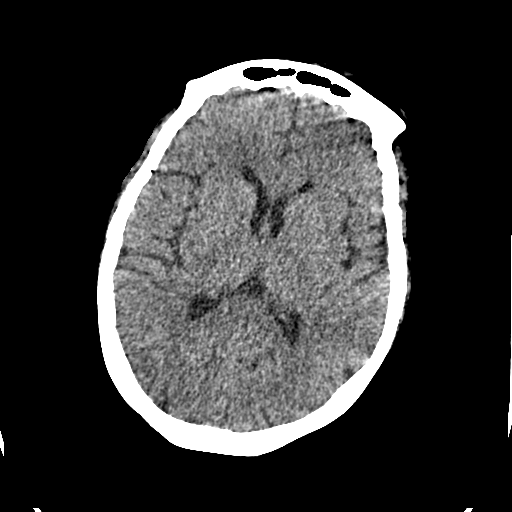
[im 74/120  brain]
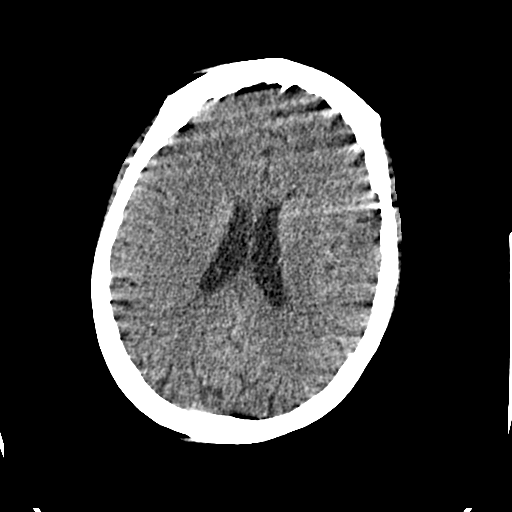
[im 83/120  brain]
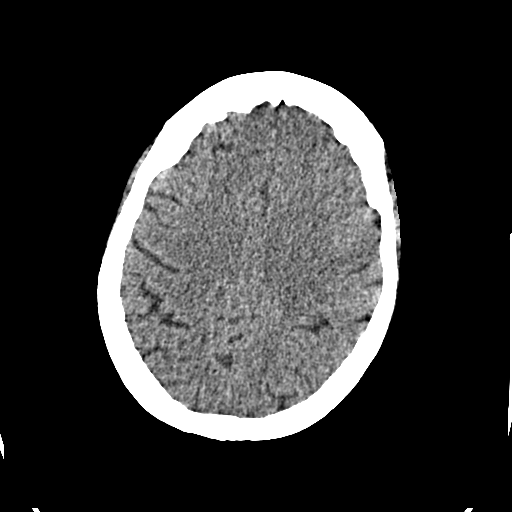
[im 83/120  bone]
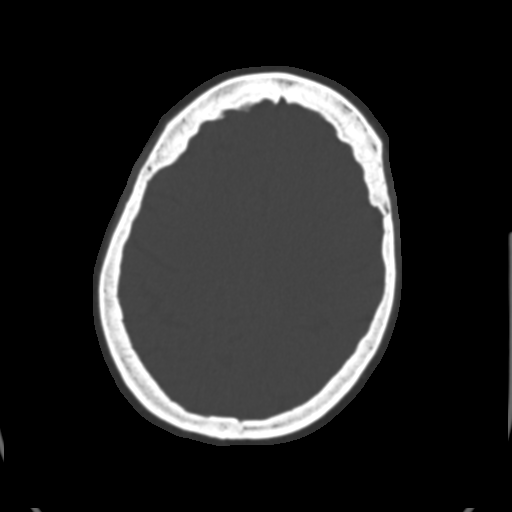
[im 92/120  brain]
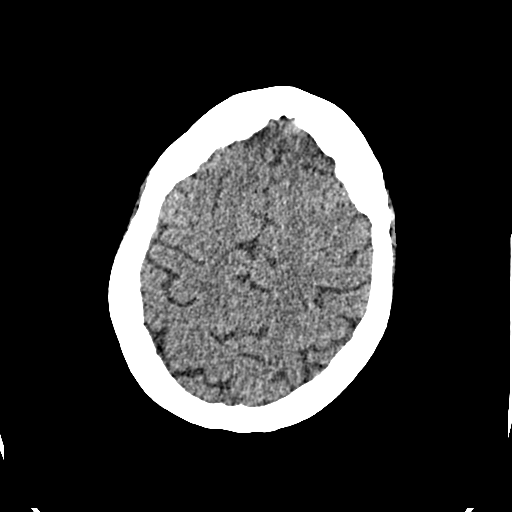
[im 101/120  brain]
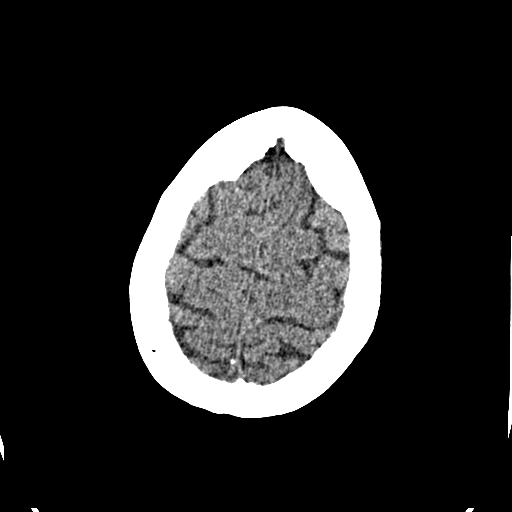
[im 110/120  brain]
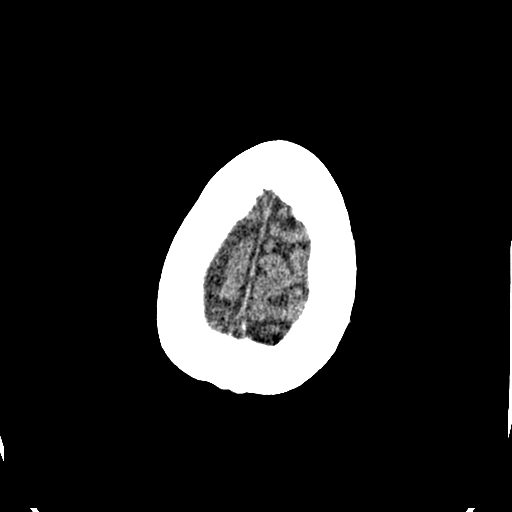

[Series 6: routine head bone · axial · 0.43mm/px · z∈[+1409,+1457]mm · 2 of 30 slices shown]
[im 10/30  bone]
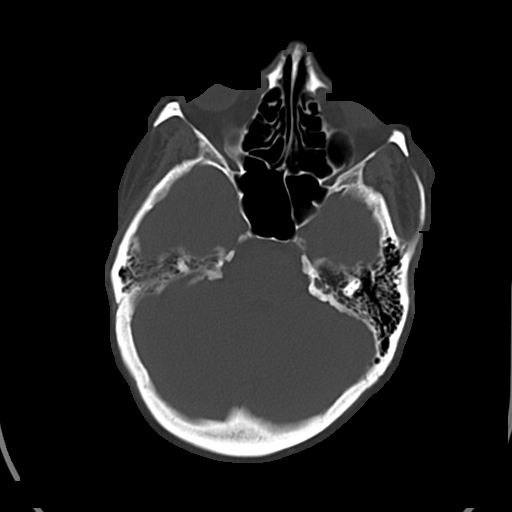
[im 20/30  bone]
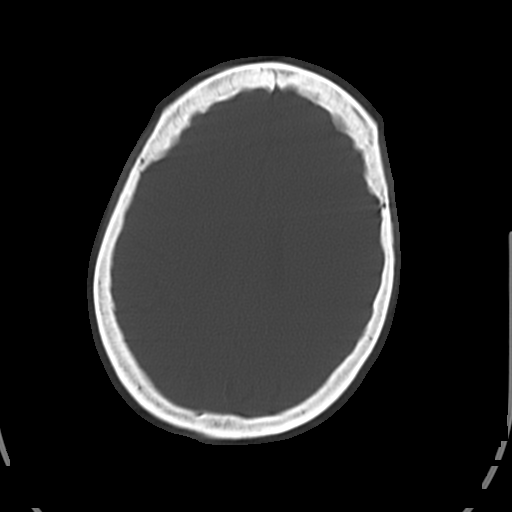

[14 of 30 positions shown; findings below may reference images not displayed]

FINDINGS: Brain: Study limited by motion artifact. No signs of intracranial
hemorrhage, mass, mass effect, midline shift or hydrocephalus.

Vascular: No hyperdense vessel or unexpected calcification.

Skull: Normal. Negative for fracture or focal lesion.

Sinuses/Orbits: No acute finding.

Other: None.
IMPRESSION: Study limited by motion artifact. No acute intracranial abnormality.

## 2019-03-28 IMAGING — CT CT CHEST W/O CM
1 of 3 series · 13 of 32 positions shown, 17 images · non-contrast
Comparison: [DATE] chest radiograph.

CLINICAL DATA: Inpatient.  Given 19 pneumonia.  Dyspnea.  Anemia.

EXAM:
CT CHEST, ABDOMEN AND PELVIS WITHOUT CONTRAST
TECHNIQUE: Multidetector CT imaging of the chest, abdomen and pelvis was
performed following the standard protocol without IV contrast.

[Series 3: (person_name) thins · axial · 0.87mm/px · z∈[+697,+1263]mm · 13 of 916 slices shown, 17 images]
[im 71/916  soft-tissue]
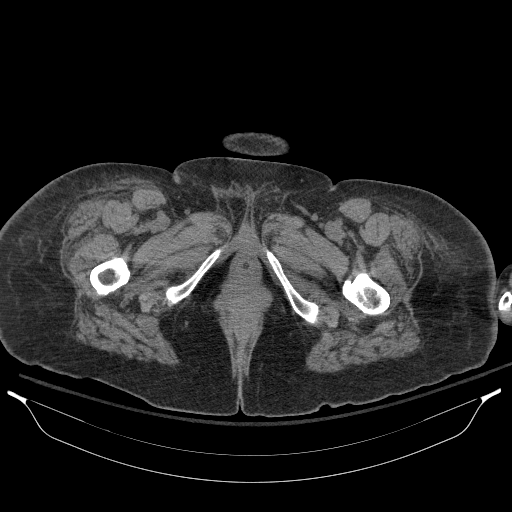
[im 71/916  bone]
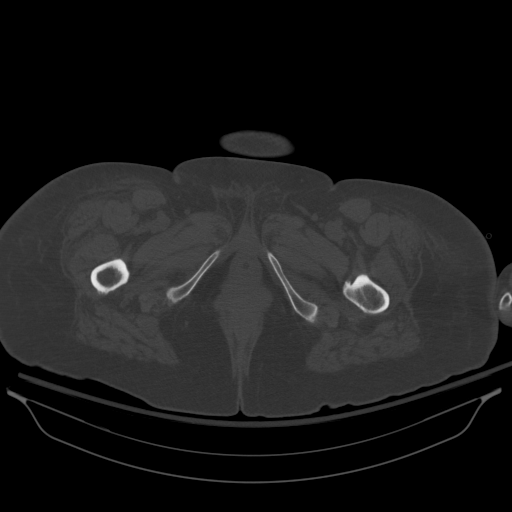
[im 141/916  soft-tissue]
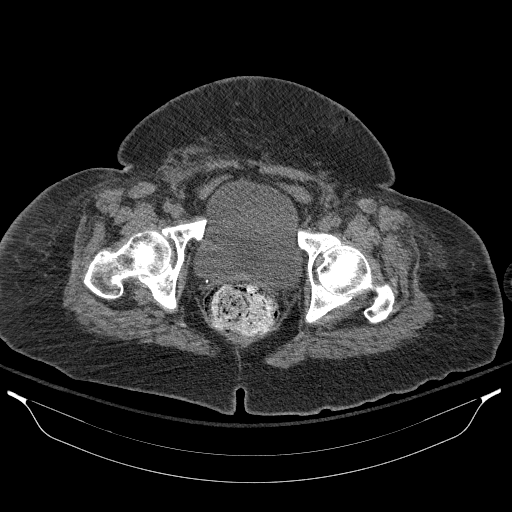
[im 212/916  soft-tissue]
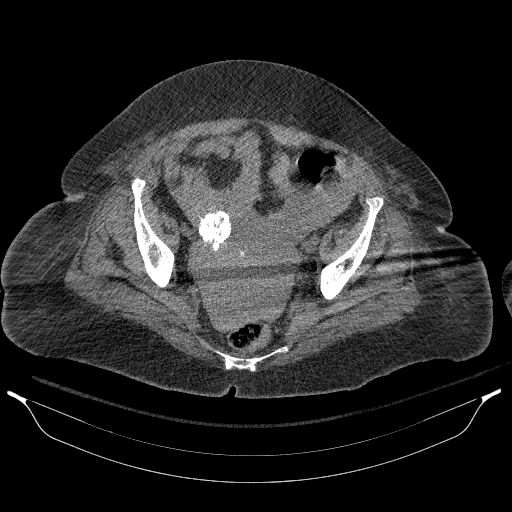
[im 282/916  soft-tissue]
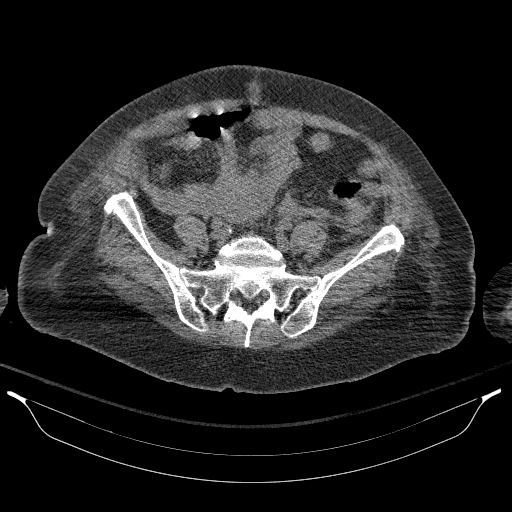
[im 388/916  soft-tissue]
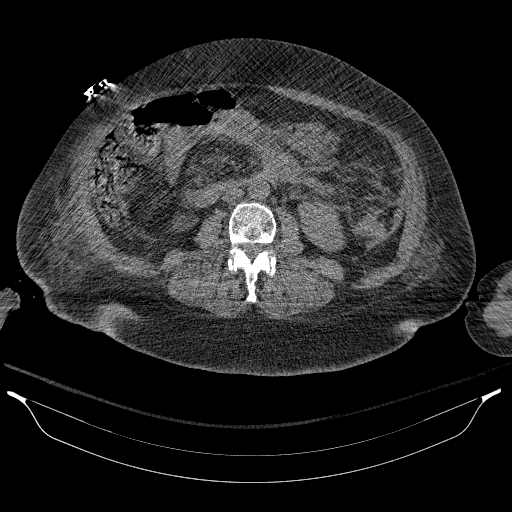
[im 458/916  soft-tissue]
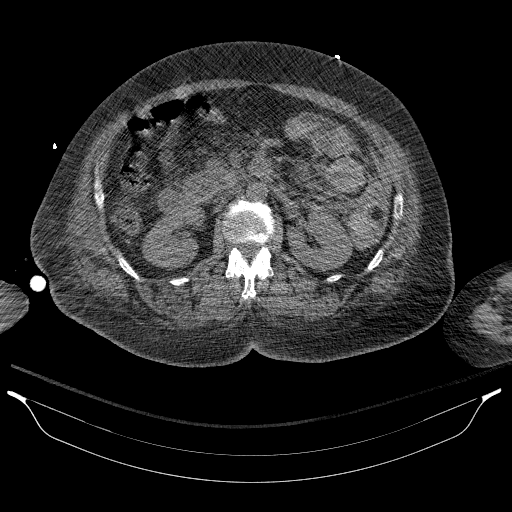
[im 528/916  soft-tissue]
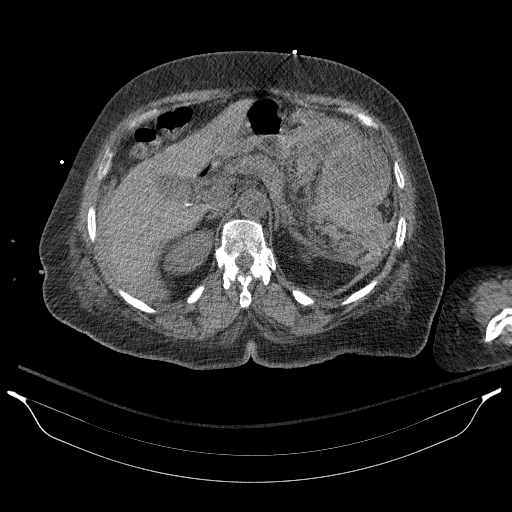
[im 634/916  soft-tissue]
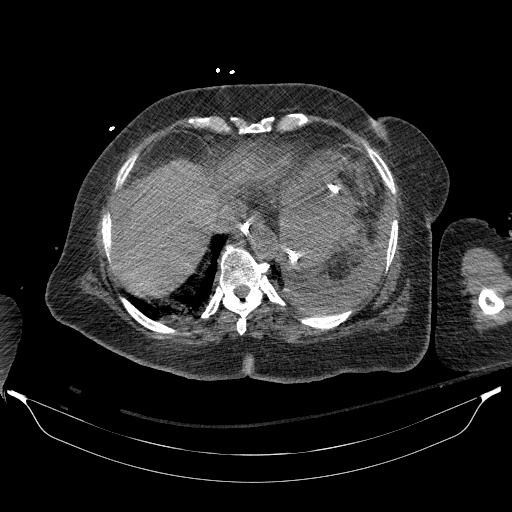
[im 704/916  soft-tissue]
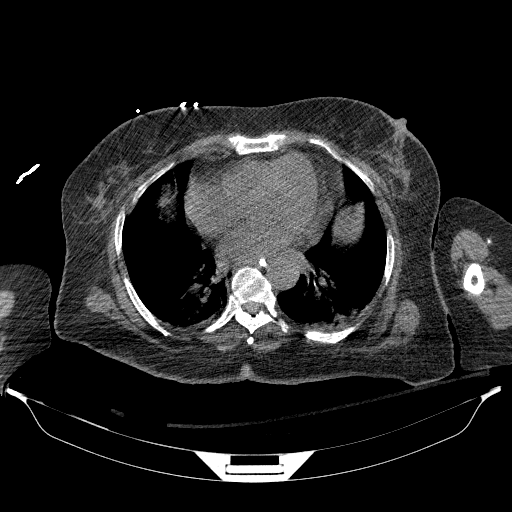
[im 704/916  bone]
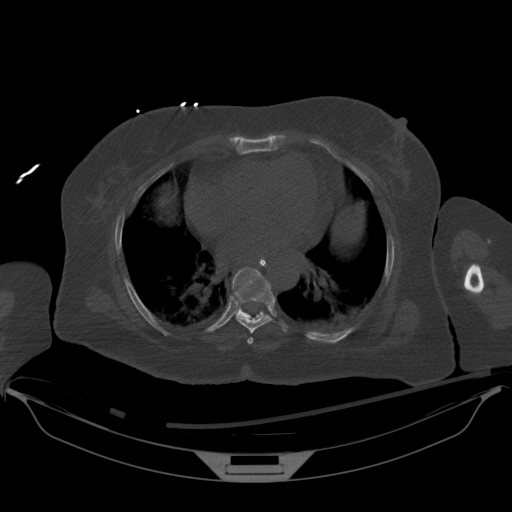
[im 775/916  soft-tissue]
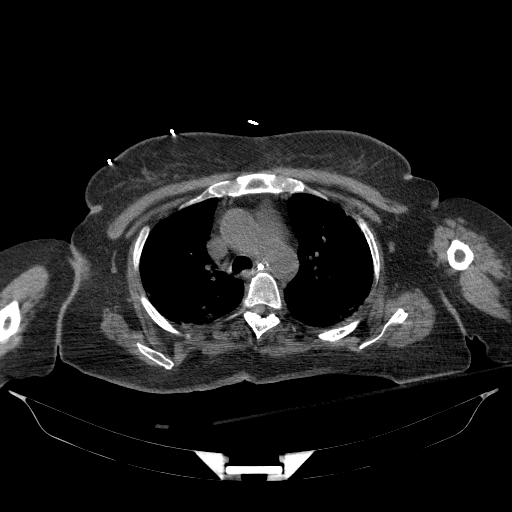
[im 775/916  lung]
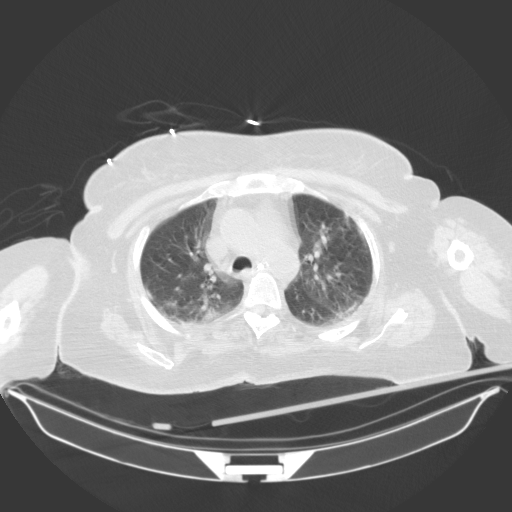
[im 810/916  lung]
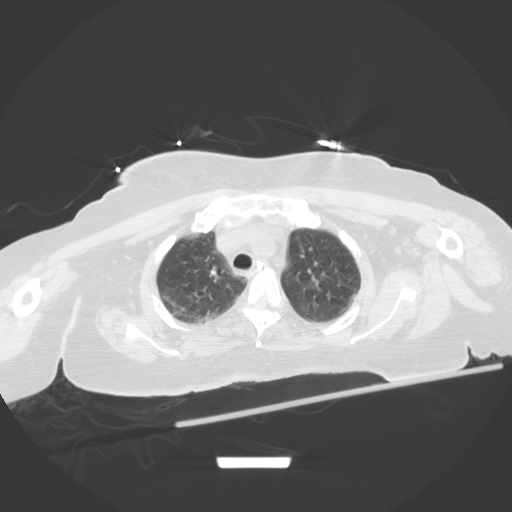
[im 845/916  soft-tissue]
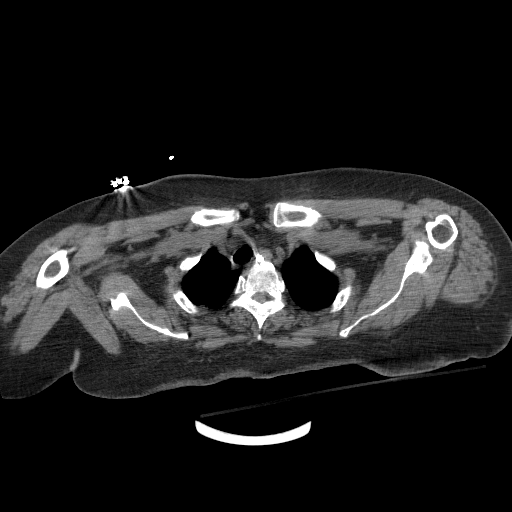
[im 845/916  lung]
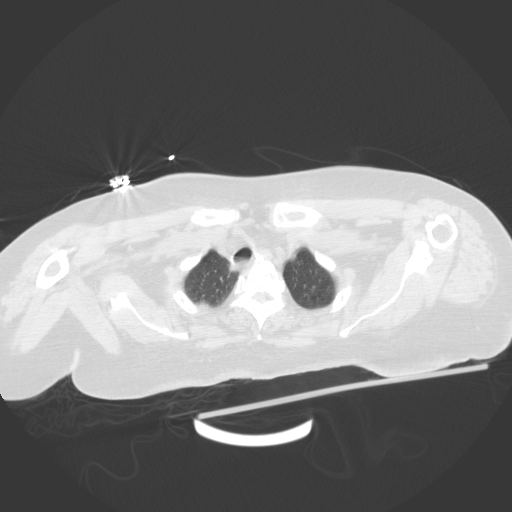
[im 880/916  lung]
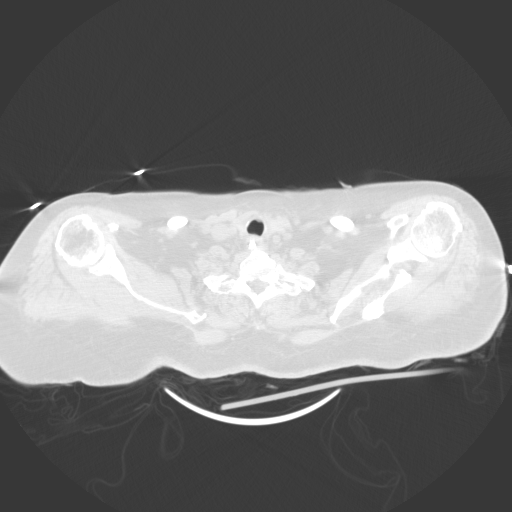

[13 of 32 positions shown; findings below may reference images not displayed]

FINDINGS: CT CHEST FINDINGS

Motion degraded scan limits assessment.

Cardiovascular: Top-normal heart size. No significant pericardial
effusion/thickening. Left anterior descending coronary
atherosclerosis. Mildly atherosclerotic nonaneurysmal thoracic
aorta. Normal caliber pulmonary arteries.

Mediastinum/Nodes: No discrete thyroid nodules. Enteric tube
terminates in body of the stomach. No pathologically enlarged
axillary, mediastinal or hilar lymph nodes, noting limited
sensitivity for the detection of hilar adenopathy on this
noncontrast study.

Lungs/Pleura: No pneumothorax. No pleural effusion. Moderate patchy
consolidation and ground-glass opacity throughout both lungs, with a
ground-glass opacities most prominent in the upper lobes and with
the consolidation most prominent in the dependent lower lobes.
Posterior left upper lobe 6 mm pulmonary nodule (series 4/image 19).
No lung masses or additional significant pulmonary nodules on this
motion degraded scan.

Musculoskeletal: No aggressive appearing focal osseous lesions. Mild
thoracic spondylosis.

CT ABDOMEN PELVIS FINDINGS

Hepatobiliary: Normal liver with no liver mass. Cholelithiasis. No
gallbladder wall thickening or pericholecystic fluid. No biliary
ductal dilatation.

Pancreas: Normal, with no mass or duct dilation.

Spleen: Normal size. No mass.

Adrenals/Urinary Tract: Normal adrenals. No renal stones. No
hydronephrosis. Simple 2.5 cm medial upper left renal cyst. No
additional contour deforming renal lesions. Foley catheter
terminates in the bladder. Tiny foci of nondependent bladder gas
compatible with instrumentation. Otherwise normal bladder.

Stomach/Bowel: Enteric tube loops in the gastric fundus with the tip
in the body of the stomach. Stomach is nondistended and is
unremarkable. Normal caliber small bowel with no small bowel wall
thickening. Candidate normal appendix in the right lower quadrant.
There is a large hemorrhagic 13.9 x 8.7 cm left upper quadrant mass
(series 2/image 52) centered in the splenic flexure of the colon
with heterogeneous hyperdensity and surrounding extensive fat
stranding. Otherwise unremarkable nondistended large bowel.

Vascular/Lymphatic: Mildly atherosclerotic nonaneurysmal abdominal
aorta. No pathologically enlarged lymph nodes in the abdomen or
pelvis.

Reproductive: There is a coarse 3.7 cm right pelvic calcification
(series 2/image 100), presumably a degenerated calcified fibroid,
poorly delineated on this noncontrast scan. No discrete adnexal
masses.

Other: No pneumoperitoneum. Moderate volume hemoperitoneum, most
prominent in the pelvis. No focal fluid collections. Mild body wall
anasarca.

Musculoskeletal: No aggressive appearing focal osseous lesions.
IMPRESSION: 1. Large 13.9 x 8.7 cm hemorrhagic left upper quadrant mass centered
in the splenic flexure of the colon with moderate volume
hemoperitoneum, most prominent in the pelvis. Acute hemorrhage
arising from an underlying colonic neoplasm not excluded.
2. No free air.  No findings of bowel obstruction.
3. Moderate patchy ground-glass opacity and consolidation in both
lungs, with an appearance compatible with [YE] pneumonia.
4. Left upper lobe 6 mm solid pulmonary nodule. Non-contrast chest
CT at 6-12 months is recommended. If the nodule is stable at time of
repeat CT, then future CT at 18-24 months (from today's scan) is
considered optional for low-risk patients, but is recommended for
high-risk patients. This recommendation follows the consensus
statement: Guidelines for Management of Incidental Pulmonary Nodules
Detected on CT Images:From the [HOSPITAL] [YE]; published
online before print (10.1148/radiol.[PHONE_NUMBER]).
5. One vessel coronary atherosclerosis.
6. Cholelithiasis.
7. Enteric tube terminates in the body of the stomach.
8.  Aortic Atherosclerosis ([YE]-[YE]).

Critical Value/emergent results were called by telephone at the time
POPOV, who verbally acknowledged these results.

## 2019-03-28 MED ORDER — SODIUM CHLORIDE 0.9% IV SOLUTION: Freq: Once | INTRAVENOUS | Status: AC

## 2019-03-28 MED ORDER — LIP MEDEX EX OINT
1.0000 "application " | TOPICAL_OINTMENT | CUTANEOUS | Status: DC | PRN
  Filled 2019-03-28: qty 7

## 2019-03-28 NOTE — Progress Notes (Addendum)
PROGRESS NOTE  Rebecca Bruce [id number] DOB: 1961/01/29 DOA: 03/11/2019 PCP: Steele Sizer, MD   LOS: 17 days   Brief Narrative / Interim history: Patient is a33 y.o.femalewith PMHx ofhypothyroidism, HTN, intellectual disability-lives in a group home for the past 20 years (normally fairly independent and talks)-brought in to [facility] on 11/28 for altered mental status, poor oral intake-further evaluation revealed COVID-19 pneumonia. Patient was subsequently admitted to the hospitalist service at [facility] on steroids and remdesivir.  Patient was thought to have encephalopathy secondary to Covid 19-CT head x2 was negative at [facility].  She was subsequently transferred to [facility] on 11/30 for further ongoing care.  Hospital course was complicated by significant catatonia for which neurology was consulted, and at 1 point patient was unable to protect her airway and required to be intubated.  She also developed shock of unknown etiology on 12/15-12/16 requiring pressors, now resolved, and she was transferred to [facility] on 12/17.  Significant events 11/28 Admission [facility] 11/30 Transfer to [facility] 12/04 Intubation for inability to protect airway 12/05 Diprivan, fentanyl gtt. Suctioned large plug by RT >> Peak pressure improved.  Tol SBT 12/12 Tx to [place]. Sedation decreased but put back on fentanyl drip overnight for tachycardia.  Failed SBT 12/13 PSV wean, mental status barrier to extubation 12/14: Extubated.  During the evening hours patient hypotensive.  This was after receiving propranolol placed on Neo-Synephrine and administered albumin 12/15: phenylephrine doses increasing.  12/16: pressor demands down after volume, stress steroids, and art line placement.   12/17: Transfer to [place].  New onset profound anemia with hemoglobin 3.4  Subjective / 24h Interval events: Poorly responsive, does not follow commands, resists when I try to open her eyes but does not interact with me  Assessment &  Plan:  Principal Problem Acute anemia due to hemorrhagic shock and intra-abdominal bleed -patient's last hemoglobin was 11.6 on 12/14, stable since admission, and recheck this morning showed a hemoglobin of 3.4 -There is no evidence of external bleeding, no blood in stool or urine -Transfused 2 units of packed red blood cells stat, recheck hemoglobin afterwards   Active Problems Hemorrhagic shock -Initially shock without clear etiology, empiric work-up showed cortisol appropriate at 65.6 12/15, TSH 3.2 11/28 -Patient required vasopressors 12/15-12/16, she was placed on stress of steroids, and now she is off pressors and normotensive however her hemoglobin was found to be down to 3.5, stat CT scan of the abdomen and pelvis showed a large hemorrhagic left upper quadrant mass with moderate volume hemoperitoneum most prominent in the pelvis.  Suspect source for acute hemorrhage -Discussed with general surgery, clinically patient seems to have stopped bleeding.  They feel like she is a very high risk surgical candidate with very high mortality, in addition there is no clear source for her bleed. -I have discussed with interventional radiology as well, an angiogram at this point without her actively bleeding would not identify the source  Current plan is for the patient to be watched closely in stepdown status, if she is hypotensive again or hemoglobin drops and there is any concern for recurrent bleeding she will need a stat CT angio of the abdomen pelvis and a stat IR and surgical consult to better address potential source of bleed.  Given lack of resources at [facility] will transfer patient to [facility]  Discussed with the patient's sister goals of care, and she remains full code and full scope of treatment and surgery /IR embolization if needed  AKI -Likely due to  shock, blood pressure now is normalized, watch renal function.  Avoid nephrotoxic medications  Elevated LFTs -Likely  shock liver, watch LFTs -Discontinue statin   Acute Hypoxic Respiratory Failure due to Covid-19 Viral Illness -This is resolved, she is on room air, extubated.  CT scan of the abdomen pelvis today shows residual patchy groundglass opacities and consolidation in both lungs compatible with COVID-19 pneumonia  Persistent encephalopathy with catatonia, underlying mental retardation living in a group home  -neurology consulted, s/p LP. Treated with benzodiazepines, also antibiotics for meningitis coverage 12/2-12/7, LP negative for bacterial meningitis with 1 WBC, cultures negative. -MRI brain normal on 12/3 -awaiting GAD 65 Ab, however I do not see this lab pending, perhaps send out test (?) -oligoclonal bands negative -minimize sedation  -I wonder whether she does indeed have a malignancy as the CT suggests an her encephalopathy may have represented a paraneoplastic syndrome  Hypothyroidism -continue synthroid, TSH normal   HLD -Hold atorvastatin due to elevated LFTs  Peripheral neuropathy -gabapentin on hold    Scheduled Meds: . atorvastatin  20 mg Per Tube q1800  . Chlorhexidine Gluconate Cloth  6 each Topical Daily  . docusate  100 mg Per Tube Daily  . enoxaparin (LOVENOX) injection  45 mg Subcutaneous Q12H  . hydrocortisone sodium succinate  50 mg Intravenous Q6H  . insulin aspart  0-15 Units Subcutaneous Q4H  . levothyroxine  50 mcg Intravenous Daily  . sodium chloride flush  10-40 mL Intracatheter Q12H  . vitamin C  500 mg Per Tube Daily  . zinc sulfate  220 mg Per Tube Daily   Continuous Infusions: . sodium chloride Stopped (03/12/19 1210)  . sodium chloride Stopped (03/25/19 2347)  . feeding supplement (VITAL AF 1.2 CAL) 70 mL/hr at 03/28/19 0600  . phenylephrine (NEO-SYNEPHRINE) Adult infusion Stopped (03/27/19 1318)   PRN Meds:.sodium chloride, acetaminophen, hydrALAZINE, ipratropium-albuterol, sodium chloride flush  DVT prophylaxis: Lovenox Code Status: Full  code Family Communication: Discussed with the sister over the phone Disposition Plan: Transfer to [facility]  Consultants:  Neurology  PCCM  Procedures:  12/03 EEG > mod to severe diffuse encephalopathy, non-specific etiology but could be due to toxic metabolic causes  Microbiology: 11/27 urine culture > multiple species 11/28 blood > negative 11/28 SARS COV 2 > positive 12/04 sputum > normal flora  Antimicrobials: 11/28 Vanc > 11/30, restart 12/2 > 12/7 11/28 mero >  12/2 11/29 remdesivir > 12/3 12/02 ceftriaxone > 12/7 12/04 flagyl > off    Objective: Vitals:   03/28/19 0300 03/28/19 0400 03/28/19 0500 03/28/19 0600  BP:  (!) 119/55    Pulse: 91 (!) 30 88 94  Resp: (!) 30 (!) 28 (!) 31 (!) 25  Temp:  98.7 F (37.1 C)    TempSrc:  Oral    SpO2: 99% 96% 98% 97%  Weight:      Height:        Intake/Output Summary (Last 24 hours) at 03/28/2019 0610 Last data filed at 03/28/2019 0600 Gross per 24 hour  Intake 1857.91 ml  Output 655 ml  Net 1202.91 ml   Filed Weights   03/22/19 0500 03/24/19 0449 03/25/19 0223  Weight: 88.8 kg 88.9 kg 87.9 kg    Examination:  Constitutional: Not responding, opens eyes intermittently Eyes: no scleral icterus ENMT: Mucous membranes are moist.  Neck: normal, supple Respiratory: clear to auscultation bilaterally, no wheezing, no crackles. Normal respiratory effort. No accessory muscle use.  Cardiovascular: Regular rate and rhythm, no murmurs / rubs /  gallops. No LE edema. Good peripheral pulses Abdomen: Soft, does appear tender as she is grimacing at times on deep palpation, bowel sounds positive Musculoskeletal: no clubbing / cyanosis.  Skin: no rashes Neurologic: Unable to assess   Data Reviewed: I have independently reviewed following labs and imaging studies   CBC: Recent Labs  Lab 03/21/19 0613 03/23/19 0916 03/24/19 0600 03/25/19 0340  WBC 10.5  --   --  11.8*  NEUTROABS 7.2  --   --   --   HGB 12.6 12.2 12.6  11.6*  HCT 40.5 36.0 37.0 36.6  MCV 84.9  --   --  83.8  PLT 250  --   --  [id number]   Basic Metabolic Panel: Recent Labs  Lab 03/21/19 0613 03/22/19 0254 03/23/19 0602 03/23/19 0916 03/24/19 0600 03/25/19 0340 03/26/19 0705  NA 139 141 139 137 135 136 137  K 5.3* 4.2 3.9 3.8 3.7 3.8 5.1  CL 103 104 101  --   --  101 105  CO2 30 30 29   --   --  28 21*  GLUCOSE 112* 125* 98  --   --  131* 112*  BUN 19 20 21*  --   --  19 26*  CREATININE 0.44 0.48 0.45  --   --  0.40* 0.87  CALCIUM 8.4* 8.3* 8.4*  --   --  8.3* 7.8*  MG 2.3 2.1 2.1  --   --   --   --   PHOS 4.5 4.0 3.8  --   --   --   --    GFR: Estimated Creatinine Clearance: 73.4 mL/min (by C-G formula based on SCr of 0.87 mg/dL). Liver Function Tests: No results for input(s): AST, ALT, ALKPHOS, BILITOT, PROT, ALBUMIN in the last 168 hours. No results for input(s): LIPASE, AMYLASE in the last 168 hours. No results for input(s): AMMONIA in the last 168 hours. Coagulation Profile: No results for input(s): INR, PROTIME in the last 168 hours. Cardiac Enzymes: No results for input(s): CKTOTAL, CKMB, CKMBINDEX, TROPONINI in the last 168 hours. BNP (last 3 results) No results for input(s): PROBNP in the last 8760 hours. HbA1C: No results for input(s): HGBA1C in the last 72 hours. CBG: Recent Labs  Lab 03/27/19 1115 03/27/19 1524 03/27/19 1934 03/28/19 0056 03/28/19 0447  GLUCAP 198* 135* 157* 185* 211*   Lipid Profile: No results for input(s): CHOL, HDL, LDLCALC, TRIG, CHOLHDL, LDLDIRECT in the last 72 hours. Thyroid Function Tests: No results for input(s): TSH, T4TOTAL, FREET4, T3FREE, THYROIDAB in the last 72 hours. Anemia Panel: No results for input(s): VITAMINB12, FOLATE, FERRITIN, TIBC, IRON, RETICCTPCT in the last 72 hours. Urine analysis:    Component Value Date/Time   COLORURINE YELLOW (A) 03/08/2019 1433   APPEARANCEUR CLOUDY (A) 03/08/2019 1433   LABSPEC 1.017 03/08/2019 1433   PHURINE 7.0 03/08/2019 1433    GLUCOSEU NEGATIVE 03/08/2019 1433   HGBUR SMALL (A) 03/08/2019 1433   BILIRUBINUR NEGATIVE 03/08/2019 1433   BILIRUBINUR neg 09/25/2018 1447   KETONESUR NEGATIVE 03/08/2019 1433   PROTEINUR 100 (A) 03/08/2019 1433   UROBILINOGEN 0.2 09/25/2018 1447   NITRITE NEGATIVE 03/08/2019 1433   LEUKOCYTESUR LARGE (A) 03/08/2019 1433   Sepsis Labs: Invalid input(s): PROCALCITONIN, LACTICIDVEN  Recent Results (from the past 240 hour(s))  CSF culture     Status: None   Collection Time: 03/20/19  3:11 PM   Specimen: PATH Cytology CSF; Cerebrospinal Fluid  Result Value Ref Range Status   Specimen Description  CSF  Final   Special Requests NONE  Final   Gram Stain   Final    WBC PRESENT,BOTH PMN AND MONONUCLEAR NO ORGANISMS SEEN CYTOSPIN SMEAR    Culture   Final    NO GROWTH 3 DAYS Performed at [facility], [address], [place], [place] [address]    Report Status 03/23/2019 FINAL  Final      Radiology Studies: DG Abd Portable 1V  Result Date: 03/27/2019 CLINICAL DATA:  Check gastric catheter placement EXAM: PORTABLE ABDOMEN - 1 VIEW COMPARISON:  None. FINDINGS: Feeding catheter is noted coiled within the gastric bubble. No obstructive changes are seen. No acute bony abnormality is noted. IMPRESSION: Feeding catheter coiled within the stomach. Electronically Signed   By: Inez Catalina M.D.   On: 03/27/2019 12:27    The patient is critically ill with multiple organ system failure, shock liver, acute kidney injury, hemorrhagic shock and requires high complexity decision making for assessment and support, frequent evaluation and titration of therapies, advanced monitoring, review of radiographic studies and interpretation of complex data.    Critical Care Time devoted to patient care services, exclusive of separately billable procedures, described in this note is  45 minutes.    Marzetta Board, MD, PhD [facility]  Contact via  [email or web address]  [place] P:  [phone] F: [phone]

## 2019-03-28 NOTE — Progress Notes (Signed)
NAME:  Rebecca Bruce, MRN:  [id number], DOB:  Jun 11, 1960, LOS: 38 ADMISSION DATE:  03/11/2019, CONSULTATION DATE:  12/4 REFERRING MD:  Sloan Leiter, CHIEF COMPLAINT:  Inability to protect airway   Brief History   58 y/o female brought to [facility] on 11/28 in the setting of altered mental status from her group home.  She was found to have COVID pneumonia, treated with decadron and remdesivir then sent to [facility] on 11/30.  Here has had progressive increase in acute encephalopathy and has become less and less responsive.  12/4 PCCM asked to see her because she reached the point that she was aspirating and not responding to pharyngeal/tracheal suctioning.  By report her exam findings have waxed and waned over the last three days but have not generally moved in the right direction.  Neurology saw her on 12/2 and apparently at that time she followed some simple commands, but she was non-verbal at the time with significant rigidity noted in her upper extremities.   At baseline lives in a group home, can bathe herself and have a conversation.  Has baseline anxiety and depression.  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   Past Medical History  Thyroid disease Hypertension GERD Anxiety  Depression [place] [facility] Events   11/28 Admission [facility] 11/30 Transfer to [facility] 12/04 Intubation for inability to protect airway 12/05 Diprivan, fentanyl gtt. Suctioned large plug by RT >> Peak pressure improved.  Tol SBT 12/12 Tx to [place]. Sedation decreased but put back on fentanyl drip overnight for tachycardia.  Failed SBT 12/13 PSV wean, mental status barrier to extubation 12/14: Extubated.  During the evening hours patient hypotensive.  This was after receiving propranolol placed on Neo-Synephrine and administered albumin 12/15: phenylephrine doses increasing.  12/16: pressor demands down after volume, stress steroids, and  art line placement.   Consults:  Neurology  Procedures:  12/4 ETT >>12/15  Significant Diagnostic Tests:  11/28 CT head > mucosal thickening in ethmoid air cells, study otherwise unremarkable  12/03 MRI Brain > normal MRI brain  12/03 EEG > mod to severe diffuse encephalopathy, non-specific etiology but could be due to toxic metabolic causes CT abdomen pelvis 12/17 large new hemoperitoneum in the left upper quadrant of the abdomen near the splenic flexure of the colon etiology not clear negative for free air or obstruction Micro Data:  11/27 urine culture > multiple species 11/28 blood > negative 11/28 SARS COV 2 > positive 12/04 sputum > normal flora  Antimicrobials:  11/28 Vanc > 11/30, restart 12/2 > 12/7 11/28 mero >  12/2 11/29 remdesivir > 12/3 12/02 ceftriaxone > 12/7 12/04 flagyl > off  Interim history/subjective:   Hemoglobin acutely dropped from 11.6 down to 3.9 now has hemoperitoneum Objective   Blood pressure (Abnormal) 139/93, pulse 87, temperature 97.7 F (36.5 C), temperature source Oral, resp. rate (Abnormal) 26, height 5\' 2"  (1.575 m), weight 87.9 kg, SpO2 96 %.        Intake/Output Summary (Last 24 hours) at 03/28/2019 1145 Last data filed at 03/28/2019 1100 Gross per 24 hour  Intake 1513.19 ml  Output 580 ml  Net 933.19 ml   Filed Weights   03/22/19 0500 03/24/19 0449 03/25/19 0223  Weight: 88.8 kg 88.9 kg 87.9 kg    Examination: General 58 year old black female remains unresponsive no acute distress HEENT normocephalic atraumatic mucous membranes moist nasogastric tube in place Pulmonary:  Clear diminished bases no accessory use remains on room air Cardiac: Regular rate and rhythm Abdomen: Soft nontender Extremities: Warm dry brisk cap refill Neuro: Remains unresponsive, will grimace, will force eyes closed when we try to open them GU clear yellow  Resolved Hospital Problem list   Hypotension   Assessment & Plan:   Acute blood loss  anemia w/ spontaneous hemoperitoneum.  Etiology is not entirely clear right now Plan Discontinue all anticoagulation Transfuse  Acute hypoxic respiratory failure from COVID pneumonia: Successfully extubated on 12/14, but remains an aspiration risk Plan Cont pulse ox  Aspiration precautions  Persistent encephalopathy with catatonia, has history of underlying mental retardation EEG, MRI benign. Has been evaluated by neurology. Only workup remaining is for stiff person sydrome and anti-GAD antibodies are pending as below.  Plan Supportive care  Relative adrenal dysfunction Plan Continue stress dose steroids  Hypothyroidism Plan Synthroid   Pulmonary nodule.  Left upper lobe 6 mm in size Plan/recommendation Needs follow-up CT scan in 6 to 12 months  Best practice:  Diet: tube feeding DVT prophylaxis: lovenox per protocol GI prophylaxis: famotidine Mobility:OOB Code Status: full Disposition: ICU  Family: [name]) updated via phone 12/16 on patient status.   Family indicates she is "frequently afraid and will sit staring off into space and not answer".  [name] indicates we should try to tell her that "[name] is coming to get her and she needs to get up".    Remains high risk for re-intubation  Looks about the same in spite of her new bleeding.  She is hemodynamically stable, there is no evidence of free air on CT imaging.  For now she will be transfused, and will watch her closely.  Critical care will remain on standby deferring additional interventions to the internal medicine service  Erick Colace ACNP-BC [place] Pager # [phone] OR # [phone] if no answer

## 2019-03-28 NOTE — Consult Note (Signed)
Rebecca Bruce 04-Oct-1960  QK:[id number].    Requesting MD: Dr. Marzetta Board Chief Complaint/Reason for Consult: intraperitoneal hemorrhage  HPI:  This is a 58 yo mentally delayed black female who lives in a group home with a history of HTN who is unable to provide any history secondary to acute encephalopathy.  Her history is obtained from the chart and medical team.  Apparently the patient got COVID and was admitted to [facility] for several days and ultimately transferred to [facility] secondary to worsening status.  She required intubation for a couple of weeks and was able to be extubated on 12/14.  She has a Cortrak in place but secondary to acute encephalopathy is non-verbal currently and does not follow any commands.  She was on some pressor support yesterday secondary to hypotension of unclear etiology.  Today her CBC revealed a drop in hgb from 11 to 3.4.  She got a scan of her abdomen which revealed a large mass-like finding that measures around 14x9cm felt to arise from the colon at splenic flexure with intraperitoneal hemorrhage.  She had a soft brown stool this morning with no evidence for GI bleeding.  She is currently stable, but given findings on her CT scan we have been asked to see her for further recommendations.    ROS: ROS: unable secondary to acute encephalopathy.  Family History  Problem Relation Age of Onset  . Heart disease Mother   . Breast cancer Neg Hx     Past Medical History:  Diagnosis Date  . GERD (gastroesophageal reflux disease)   . Hypertension   . Thyroid disease     Past Surgical History:  Procedure Laterality Date  . DENTAL SURGERY      Social History:  reports that she has never smoked. She has never used smokeless tobacco. She reports that she does not drink alcohol or use drugs.  Allergies: No Known Allergies  Medications Prior to Admission  Medication Sig Dispense Refill  . ascorbic acid (VITAMIN C) 500 MG/5ML syrup Take 5 mLs (500 mg  total) by mouth daily. 473 mL 12  . atorvastatin (LIPITOR) 20 MG tablet TAKE 1 TABLET BY MOUTH AT BEDTIME FOR CHOLESTEROL (Patient taking differently: Take 20 mg by mouth at bedtime. ) 30 tablet 5  . dexamethasone (DECADRON) 10 MG/ML injection Inject 0.6 mLs (6 mg total) into the vein daily. 1 mL 0  . gabapentin (NEURONTIN) 300 MG capsule Take 1 capsule (300 mg total) by mouth at bedtime. 30 capsule 2  . levothyroxine (SYNTHROID) 100 MCG tablet Take 1 tablet (100 mcg total) by mouth daily before breakfast. 30 tablet 2  . LORazepam (ATIVAN) 0.5 MG tablet Take 0.5 mg by mouth daily as needed for anxiety. Can take an extra pill 30 minutes after the first dose if needed    . meropenem 1 g in sodium chloride 0.9 % 100 mL Inject 1 g into the vein every 8 (eight) hours.    . mupirocin ointment (BACTROBAN) 2 % Apply 1 application topically 2 (two) times daily as needed (for itchy bumps).     Marland Kitchen PARoxetine (PAXIL) 30 MG tablet TAKE 1 TABLET BY MOUTH AT BEDTIME FOR DEPRESSION (Patient taking differently: Take 30 mg by mouth at bedtime. ) 30 tablet 5  . saccharomyces boulardii (FLORASTOR) 250 MG capsule Take 250 mg by mouth daily.    Marland Kitchen triamcinolone cream (KENALOG) 0.1 % Apply 1 application topically 2 (two) times daily. For two weeks and stop, if no  improvement return for follow up (Patient taking differently: Apply 1 application topically 2 (two) times daily as needed (for itchy bumps). ) 45 g 0  . triamterene-hydrochlorothiazide (MAXZIDE-25) 37.5-25 MG tablet TAKE 1/2 TABLET BY MOUTH EVERY DAY (Patient taking differently: Take 0.5 tablets by mouth daily. ) 15 tablet 11  . vancomycin 1,750 mg in sodium chloride 0.9 % 500 mL Inject 1,750 mg into the vein daily.    Marland Kitchen VIACTIV CALCIUM PLUS D 650-12.5-40 MG-MCG CHEW CHEW AND SWALLOW ONE PIECE TWICE DAILY. (SUPPLEMENT) CARAMEL (Patient taking differently: Chew 1 Dose by mouth 2 (two) times daily. ) 60 tablet 12  . zinc sulfate 220 (50 Zn) MG capsule Take 1 capsule  (220 mg total) by mouth daily.       Physical Exam: Blood pressure (!) 144/76, pulse 84, temperature 98.4 F (36.9 C), temperature source Axillary, resp. rate (!) 25, height 5\' 2"  (1.575 m), weight 87.9 kg, SpO2 97 %. General: non-verbal black female who is laying in bed in NAD HEENT: head is normocephalic, atraumatic.  Sclera is noninjected in left eye.  Patient will not let me open her right eye.  PERRL of left eye.  Ears and nose without any masses or lesions.  Mouth is pink and dry.  Cortrak present in nose Heart: regular, rate, and rhythm.  Normal s1,s2. No obvious murmurs, gallops, or rubs noted.  Palpable radial and pedal pulses bilaterally Lungs: CTAB but with upper airway noises heard in chest.  Respiratory effort nonlabored Abd: soft, NT, some fullness palpable in upper abodmen, some BS, no definite masses, hernias, or organomegaly noted MS: all 4 extremities are symmetrical with no cyanosis, clubbing, or edema. Skin: warm and dry with no masses, lesions, or rashes Psych: encephalopathic, unable to assess   Results for orders placed or performed during the hospital encounter of 03/11/19 (from the past 48 hour(s))  Glucose, capillary     Status: Abnormal   Collection Time: 03/26/19  3:48 PM  Result Value Ref Range   Glucose-Capillary 118 (H) 70 - 99 mg/dL  Cortisol     Status: None   Collection Time: 03/26/19  8:00 PM  Result Value Ref Range   Cortisol, Plasma 65.6 ug/dL    Comment: RESULTS CONFIRMED BY MANUAL DILUTION (NOTE) AM    6.7 - 22.6 ug/dL PM   <10.0       ug/dL Performed at [facility] [address], [place], [place] [address]   Glucose, capillary     Status: Abnormal   Collection Time: 03/26/19  8:51 PM  Result Value Ref Range   Glucose-Capillary 131 (H) 70 - 99 mg/dL  Glucose, capillary     Status: Abnormal   Collection Time: 03/26/19 11:17 PM  Result Value Ref Range   Glucose-Capillary 163 (H) 70 - 99 mg/dL  Glucose, capillary     Status:  Abnormal   Collection Time: 03/27/19  4:04 AM  Result Value Ref Range   Glucose-Capillary 161 (H) 70 - 99 mg/dL  Glucose, capillary     Status: Abnormal   Collection Time: 03/27/19  7:40 AM  Result Value Ref Range   Glucose-Capillary 192 (H) 70 - 99 mg/dL  Glucose, capillary     Status: Abnormal   Collection Time: 03/27/19 11:15 AM  Result Value Ref Range   Glucose-Capillary 198 (H) 70 - 99 mg/dL  Glucose, capillary     Status: Abnormal   Collection Time: 03/27/19  3:24 PM  Result Value Ref Range  Glucose-Capillary 135 (H) 70 - 99 mg/dL  Glucose, capillary     Status: Abnormal   Collection Time: 03/27/19  7:34 PM  Result Value Ref Range   Glucose-Capillary 157 (H) 70 - 99 mg/dL  Glucose, capillary     Status: Abnormal   Collection Time: 03/28/19 12:56 AM  Result Value Ref Range   Glucose-Capillary 185 (H) 70 - 99 mg/dL  Glucose, capillary     Status: Abnormal   Collection Time: 03/28/19  4:47 AM  Result Value Ref Range   Glucose-Capillary 211 (H) 70 - 99 mg/dL  Basic metabolic panel     Status: Abnormal   Collection Time: 03/28/19  6:55 AM  Result Value Ref Range   Sodium 140 135 - 145 mmol/L   Potassium 3.4 (L) 3.5 - 5.1 mmol/L    Comment: DELTA CHECK NOTED RESULTS VERIFIED VIA RECOLLECT    Chloride 108 98 - 111 mmol/L   CO2 21 (L) 22 - 32 mmol/L   Glucose, Bld 244 (H) 70 - 99 mg/dL   BUN 73 (H) 6 - 20 mg/dL   Creatinine, Ser 1.41 (H) 0.44 - 1.00 mg/dL   Calcium 7.5 (L) 8.9 - 10.3 mg/dL   GFR calc non Af Amer 41 (L) >60 mL/min   GFR calc Af Amer 48 (L) >60 mL/min   Anion gap 11 5 - 15    Comment: Performed at [facility], [place] [address] [place], [place] [address]  CBC     Status: Abnormal   Collection Time: 03/28/19  6:55 AM  Result Value Ref Range   WBC 15.5 (H) 4.0 - 10.5 K/uL   RBC 1.44 (L) 3.87 - 5.11 MIL/uL   Hemoglobin 3.9 (LL) 12.0 - 15.0 g/dL    Comment: REPEATED TO VERIFY RESULTS CONFIRMED VIA RECOLLECT. THIS CRITICAL RESULT HAS  VERIFIED AND BEEN CALLED TO WATKINS, T. RN BY NICOLE MCCOY ON 12 17 2020 AT 0800, AND HAS BEEN READ BACK. CRITICAL RESULT VERIFIED CRITICAL RESULT CALLED TO, READ BACK BY AND VERIFIED WITH: JOBE, H. RN @0812  ON 12.17.2020 BY NMCCOY CORRECTED ON 12/17 AT [id number]: PREVIOUSLY REPORTED AS 3.9 REPEATED TO VERIFY RESULTS CONFIRMED VIA RECOLLECT. THIS CRITICAL RESULT HAS VERIFIED AND BEEN CALLED TO WATKINS, T. RN BY NICOLE MCCOY ON 12 17 2020 AT 0800, AND HAS BEEN READ BACK. CRITICAL RESULT  VERIFIED    HCT 12.2 (L) 36.0 - 46.0 %   MCV 84.7 80.0 - 100.0 fL   MCH 27.1 26.0 - 34.0 pg   MCHC 32.0 30.0 - 36.0 g/dL   RDW 17.2 (H) 11.5 - 15.5 %   Platelets 152 150 - 400 K/uL   nRBC 2.1 (H) 0.0 - 0.2 %    Comment: Performed at [facility], [place] [address], [place], [place] [address]  Magnesium     Status: None   Collection Time: 03/28/19  6:55 AM  Result Value Ref Range   Magnesium 2.3 1.7 - 2.4 mg/dL    Comment: Performed at [facility], [place] [address] [place], [place] [address]  Glucose, capillary     Status: Abnormal   Collection Time: 03/28/19  8:05 AM  Result Value Ref Range   Glucose-Capillary 206 (H) 70 - 99 mg/dL  ABO/Rh     Status: None   Collection Time: 03/28/19  8:22 AM  Result Value Ref Range   ABO/RH(D)      O POS Performed at [facility], [address]  [place], [place] [address]   Type and screen [place]     Status: None (Preliminary result)   Collection Time: 03/28/19  8:25 AM  Result Value Ref Range   ABO/RH(D) O POS    Antibody Screen NEG    Sample Expiration 03/31/2019,2359    Unit Number [id number]    Blood Component Type RED CELLS,LR    Unit division 00    Status of Unit ISSUED    Transfusion Status OK TO TRANSFUSE    Crossmatch Result      Compatible Performed at [facility], [place] [address] [place], [place] [address]    Unit Number [id number]    Blood  Component Type RED CELLS,LR    Unit division 00    Status of Unit ISSUED    Transfusion Status OK TO TRANSFUSE    Crossmatch Result Compatible   Prepare RBC     Status: None   Collection Time: 03/28/19  8:25 AM  Result Value Ref Range   Order Confirmation      ORDER PROCESSED BY BLOOD BANK Performed at [facility], [place] [address] [place], [place] [address]   Hepatic function panel     Status: Abnormal   Collection Time: 03/28/19 10:30 AM  Result Value Ref Range   Total Protein 5.1 (L) 6.5 - 8.1 g/dL   Albumin 2.2 (L) 3.5 - 5.0 g/dL   AST 588 (H) 15 - 41 U/L   ALT 1,684 (H) 0 - 44 U/L   Alkaline Phosphatase 84 38 - 126 U/L   Total Bilirubin 0.8 0.3 - 1.2 mg/dL   Bilirubin, Direct 0.2 0.0 - 0.2 mg/dL   Indirect Bilirubin 0.6 0.3 - 0.9 mg/dL    Comment: Performed at [facility], [place] [address] [place], [place] [address]  Protime-INR     Status: Abnormal   Collection Time: 03/28/19 11:00 AM  Result Value Ref Range   Prothrombin Time 15.7 (H) 11.4 - 15.2 seconds   INR 1.3 (H) 0.8 - 1.2    Comment: (NOTE) INR goal varies based on device and disease states. Performed at [facility], [place] [address] [place],  [address]   Glucose, capillary     Status: Abnormal   Collection Time: 03/28/19 11:17 AM  Result Value Ref Range   Glucose-Capillary 180 (H) 70 - 99 mg/dL   CT ABDOMEN PELVIS WO CONTRAST  Result Date: 03/28/2019 CLINICAL DATA:  Inpatient.  Given 19 pneumonia.  Dyspnea.  Anemia. EXAM: CT CHEST, ABDOMEN AND PELVIS WITHOUT CONTRAST TECHNIQUE: Multidetector CT imaging of the chest, abdomen and pelvis was performed following the standard protocol without IV contrast. COMPARISON:  03/24/2019 chest radiograph. FINDINGS: CT CHEST FINDINGS Motion degraded scan limits assessment. Cardiovascular: Top-normal heart size. No significant pericardial effusion/thickening. Left anterior descending coronary atherosclerosis.  Mildly atherosclerotic nonaneurysmal thoracic aorta. Normal caliber pulmonary arteries. Mediastinum/Nodes: No discrete thyroid nodules. Enteric tube terminates in body of the stomach. No pathologically enlarged axillary, mediastinal or hilar lymph nodes, noting limited sensitivity for the detection of hilar adenopathy on this noncontrast study. Lungs/Pleura: No pneumothorax. No pleural effusion. Moderate patchy consolidation and ground-glass opacity throughout both lungs, with a ground-glass opacities most prominent in the upper lobes and with the consolidation most prominent in the dependent lower lobes. Posterior left upper lobe 6 mm pulmonary nodule (series 4/image 19). No lung masses or additional significant pulmonary nodules on this motion degraded scan. Musculoskeletal: No aggressive appearing focal osseous lesions.  Mild thoracic spondylosis. CT ABDOMEN PELVIS FINDINGS Hepatobiliary: Normal liver with no liver mass. Cholelithiasis. No gallbladder wall thickening or pericholecystic fluid. No biliary ductal dilatation. Pancreas: Normal, with no mass or duct dilation. Spleen: Normal size. No mass. Adrenals/Urinary Tract: Normal adrenals. No renal stones. No hydronephrosis. Simple 2.5 cm medial upper left renal cyst. No additional contour deforming renal lesions. Foley catheter terminates in the bladder. Tiny foci of nondependent bladder gas compatible with instrumentation. Otherwise normal bladder. Stomach/Bowel: Enteric tube loops in the gastric fundus with the tip in the body of the stomach. Stomach is nondistended and is unremarkable. Normal caliber small bowel with no small bowel wall thickening. Candidate normal appendix in the right lower quadrant. There is a large hemorrhagic 13.9 x 8.7 cm left upper quadrant mass (series 2/image 52) centered in the splenic flexure of the colon with heterogeneous hyperdensity and surrounding extensive fat stranding. Otherwise unremarkable nondistended large bowel.  Vascular/Lymphatic: Mildly atherosclerotic nonaneurysmal abdominal aorta. No pathologically enlarged lymph nodes in the abdomen or pelvis. Reproductive: There is a coarse 3.7 cm right pelvic calcification (series 2/image 100), presumably a degenerated calcified fibroid, poorly delineated on this noncontrast scan. No discrete adnexal masses. Other: No pneumoperitoneum. Moderate volume hemoperitoneum, most prominent in the pelvis. No focal fluid collections. Mild body wall anasarca. Musculoskeletal: No aggressive appearing focal osseous lesions. IMPRESSION: 1. Large 13.9 x 8.7 cm hemorrhagic left upper quadrant mass centered in the splenic flexure of the colon with moderate volume hemoperitoneum, most prominent in the pelvis. Acute hemorrhage arising from an underlying colonic neoplasm not excluded. 2. No free air.  No findings of bowel obstruction. 3. Moderate patchy ground-glass opacity and consolidation in both lungs, with an appearance compatible with COVID-19 pneumonia. 4. Left upper lobe 6 mm solid pulmonary nodule. Non-contrast chest CT at 6-12 months is recommended. If the nodule is stable at time of repeat CT, then future CT at 18-24 months (from today's scan) is considered optional for low-risk patients, but is recommended for high-risk patients. This recommendation follows the consensus statement: Guidelines for Management of Incidental Pulmonary Nodules Detected on CT Images:From the Fleischner Society 2017; published online before print (10.1148/radiol.IJ:2314499). 5. One vessel coronary atherosclerosis. 6. Cholelithiasis. 7. Enteric tube terminates in the body of the stomach. 8.  Aortic Atherosclerosis (ICD10-I70.0). Critical Value/emergent results were called by telephone at the time of interpretation on 03/28/2019 at 10:31 am to provider DR. BRENT MCQUAID, who verbally acknowledged these results. Electronically Signed   By: Ilona Sorrel M.D.   On: 03/28/2019 10:32   CT HEAD WO CONTRAST  Result  Date: 03/28/2019 CLINICAL DATA:  Encephalopathy EXAM: CT HEAD WITHOUT CONTRAST TECHNIQUE: Contiguous axial images were obtained from the base of the skull through the vertex without intravenous contrast. COMPARISON:  03/09/2019 FINDINGS: Brain: Study limited by motion artifact. No signs of intracranial hemorrhage, mass, mass effect, midline shift or hydrocephalus. Vascular: No hyperdense vessel or unexpected calcification. Skull: Normal. Negative for fracture or focal lesion. Sinuses/Orbits: No acute finding. Other: None. IMPRESSION: Study limited by motion artifact. No acute intracranial abnormality. Electronically Signed   By: Zetta Bills M.D.   On: 03/28/2019 10:01   CT CHEST WO CONTRAST  Result Date: 03/28/2019 CLINICAL DATA:  Inpatient.  Given 19 pneumonia.  Dyspnea.  Anemia. EXAM: CT CHEST, ABDOMEN AND PELVIS WITHOUT CONTRAST TECHNIQUE: Multidetector CT imaging of the chest, abdomen and pelvis was performed following the standard protocol without IV contrast. COMPARISON:  03/24/2019 chest radiograph. FINDINGS: CT CHEST FINDINGS Motion degraded scan limits assessment.  Cardiovascular: Top-normal heart size. No significant pericardial effusion/thickening. Left anterior descending coronary atherosclerosis. Mildly atherosclerotic nonaneurysmal thoracic aorta. Normal caliber pulmonary arteries. Mediastinum/Nodes: No discrete thyroid nodules. Enteric tube terminates in body of the stomach. No pathologically enlarged axillary, mediastinal or hilar lymph nodes, noting limited sensitivity for the detection of hilar adenopathy on this noncontrast study. Lungs/Pleura: No pneumothorax. No pleural effusion. Moderate patchy consolidation and ground-glass opacity throughout both lungs, with a ground-glass opacities most prominent in the upper lobes and with the consolidation most prominent in the dependent lower lobes. Posterior left upper lobe 6 mm pulmonary nodule (series 4/image 19). No lung masses or additional  significant pulmonary nodules on this motion degraded scan. Musculoskeletal: No aggressive appearing focal osseous lesions. Mild thoracic spondylosis. CT ABDOMEN PELVIS FINDINGS Hepatobiliary: Normal liver with no liver mass. Cholelithiasis. No gallbladder wall thickening or pericholecystic fluid. No biliary ductal dilatation. Pancreas: Normal, with no mass or duct dilation. Spleen: Normal size. No mass. Adrenals/Urinary Tract: Normal adrenals. No renal stones. No hydronephrosis. Simple 2.5 cm medial upper left renal cyst. No additional contour deforming renal lesions. Foley catheter terminates in the bladder. Tiny foci of nondependent bladder gas compatible with instrumentation. Otherwise normal bladder. Stomach/Bowel: Enteric tube loops in the gastric fundus with the tip in the body of the stomach. Stomach is nondistended and is unremarkable. Normal caliber small bowel with no small bowel wall thickening. Candidate normal appendix in the right lower quadrant. There is a large hemorrhagic 13.9 x 8.7 cm left upper quadrant mass (series 2/image 52) centered in the splenic flexure of the colon with heterogeneous hyperdensity and surrounding extensive fat stranding. Otherwise unremarkable nondistended large bowel. Vascular/Lymphatic: Mildly atherosclerotic nonaneurysmal abdominal aorta. No pathologically enlarged lymph nodes in the abdomen or pelvis. Reproductive: There is a coarse 3.7 cm right pelvic calcification (series 2/image 100), presumably a degenerated calcified fibroid, poorly delineated on this noncontrast scan. No discrete adnexal masses. Other: No pneumoperitoneum. Moderate volume hemoperitoneum, most prominent in the pelvis. No focal fluid collections. Mild body wall anasarca. Musculoskeletal: No aggressive appearing focal osseous lesions. IMPRESSION: 1. Large 13.9 x 8.7 cm hemorrhagic left upper quadrant mass centered in the splenic flexure of the colon with moderate volume hemoperitoneum, most prominent  in the pelvis. Acute hemorrhage arising from an underlying colonic neoplasm not excluded. 2. No free air.  No findings of bowel obstruction. 3. Moderate patchy ground-glass opacity and consolidation in both lungs, with an appearance compatible with COVID-19 pneumonia. 4. Left upper lobe 6 mm solid pulmonary nodule. Non-contrast chest CT at 6-12 months is recommended. If the nodule is stable at time of repeat CT, then future CT at 18-24 months (from today's scan) is considered optional for low-risk patients, but is recommended for high-risk patients. This recommendation follows the consensus statement: Guidelines for Management of Incidental Pulmonary Nodules Detected on CT Images:From the Fleischner Society 2017; published online before print (10.1148/radiol.IJ:2314499). 5. One vessel coronary atherosclerosis. 6. Cholelithiasis. 7. Enteric tube terminates in the body of the stomach. 8.  Aortic Atherosclerosis (ICD10-I70.0). Critical Value/emergent results were called by telephone at the time of interpretation on 03/28/2019 at 10:31 am to provider DR. BRENT MCQUAID, who verbally acknowledged these results. Electronically Signed   By: Ilona Sorrel M.D.   On: 03/28/2019 10:32   DG Abd Portable 1V  Result Date: 03/27/2019 CLINICAL DATA:  Check gastric catheter placement EXAM: PORTABLE ABDOMEN - 1 VIEW COMPARISON:  None. FINDINGS: Feeding catheter is noted coiled within the gastric bubble. No obstructive changes are seen. No acute  bony abnormality is noted. IMPRESSION: Feeding catheter coiled within the stomach. Electronically Signed   By: Inez Catalina M.D.   On: 03/27/2019 12:27      Assessment/Plan H/O HTN COVID - per medicine, just extubated on 12/14 after intubation for a couple of weeks Acute encephalopathy with catatonia - cortrak in place for feeding.  Continue to hold TFs currently incase she rebleeds and needs surgical intervention. AKI - likely secondary to shock, per medicine ABL anemia -  secondary to below, transfuse as needed.  INR normal  Hemorrhagic shock secondary to intraperitoneal process The patient has been found to have something in the LUQ causing intraperitoneal hemorrhage.  Colonic mass could not be ruled out; however, it is unlikely to be a mass given she did not have any intramural bleeding.  This could be arising from her spleen or nearby vessels with the "mass" being a hematoma.  Either way, she is currently stable, off of pressors, with normal vitals signs.  She is receiving 2 units of pRBCs.  I do not think she is actively bleeding at this time.  If she appears to rebleed, she will need a CTA A/P to determine etiology of this bleed.  She then may require surgical exploration vs possible need for IR embolization pending etiology.  Because the family would like aggressive care in this patient and [facility] does not have acute surgical capabilities, she will be transferred to [facility] so treatment can be done in a succinct fashion if she were to quickly decompensate.  I have discussed this case with Dr. Redmond Pulling, my current attending at [facility], as well as Dr Donne Hazel who is at [facility] currently.  Both have reviewed the patient's data and imaging and agree with this plan.  The patient is still critically ill and not at all out of the woods from her underlying COVID, on steroids etc, and would likely not do well with an operation.  She is high risk for ending up back on the ventilator with postoperative complications.  Dr. Cruzita Lederer has spoken to the patient's brother and at this time he would like aggressive care.  If the patient were to need an acute operation, we would obviously call him to discuss further risks and complications as well.  We will continue to follow this patient with you currently.    FEN - hold TFs right now VTE - hold due to recent hemorrhage ID - none currently needed  Henreitta Cea, [facility] 03/28/2019, 1:58 PM Please see Amion for pager number  during day hours 7:00am-4:30pm

## 2019-03-28 NOTE — Progress Notes (Signed)
Patients sister [name] called and updated that the patient will be going to [facility] for treatment. Family also updated that we had given patient 2 units of blood emergently.  Patients sister denies further questions.

## 2019-03-29 ENCOUNTER — Telehealth (HOSPITAL_COMMUNITY): Payer: Self-pay | Admitting: *Deleted

## 2019-03-29 ENCOUNTER — Inpatient Hospital Stay (HOSPITAL_COMMUNITY): Payer: Medicare Other

## 2019-03-29 DIAGNOSIS — D62 Acute posthemorrhagic anemia: Secondary | ICD-10-CM

## 2019-03-29 DIAGNOSIS — R578 Other shock: Secondary | ICD-10-CM

## 2019-03-29 HISTORY — PX: IR ANGIOGRAM VISCERAL SELECTIVE: IMG657

## 2019-03-29 HISTORY — PX: IR ANGIOGRAM SELECTIVE EACH ADDITIONAL VESSEL: IMG667

## 2019-03-29 HISTORY — PX: IR US GUIDE VASC ACCESS RIGHT: IMG2390

## 2019-03-29 LAB — CBC
HCT: 19.9 % — ABNORMAL LOW (ref 36.0–46.0)
HCT: 17.5 % — ABNORMAL LOW (ref 36.0–46.0)
HCT: 29.2 % — ABNORMAL LOW (ref 36.0–46.0)
Hemoglobin: 6.9 g/dL — CL (ref 12.0–15.0)
Hemoglobin: 6.1 g/dL — CL (ref 12.0–15.0)
Hemoglobin: 10.4 g/dL — ABNORMAL LOW (ref 12.0–15.0)
MCH: 29.4 pg (ref 26.0–34.0)
MCH: 30.2 pg (ref 26.0–34.0)
MCH: 31 pg (ref 26.0–34.0)
MCHC: 34.7 g/dL (ref 30.0–36.0)
MCHC: 34.9 g/dL (ref 30.0–36.0)
MCHC: 35.6 g/dL (ref 30.0–36.0)
MCV: 84.7 fL (ref 80.0–100.0)
MCV: 86.6 fL (ref 80.0–100.0)
MCV: 86.9 fL (ref 80.0–100.0)
Platelets: 159 10*3/uL (ref 150–400)
Platelets: 138 10*3/uL — ABNORMAL LOW (ref 150–400)
Platelets: 84 10*3/uL — ABNORMAL LOW (ref 150–400)
RBC: 2.35 MIL/uL — ABNORMAL LOW (ref 3.87–5.11)
RBC: 2.02 MIL/uL — ABNORMAL LOW (ref 3.87–5.11)
RBC: 3.36 MIL/uL — ABNORMAL LOW (ref 3.87–5.11)
RDW: 15.4 % (ref 11.5–15.5)
RDW: 15.4 % (ref 11.5–15.5)
RDW: 14.3 % (ref 11.5–15.5)
WBC: 27.1 10*3/uL — ABNORMAL HIGH (ref 4.0–10.5)
WBC: 27.8 10*3/uL — ABNORMAL HIGH (ref 4.0–10.5)
WBC: 31 10*3/uL — ABNORMAL HIGH (ref 4.0–10.5)
nRBC: 7.4 % — ABNORMAL HIGH (ref 0.0–0.2)
nRBC: 10.6 % — ABNORMAL HIGH (ref 0.0–0.2)
nRBC: 17 % — ABNORMAL HIGH (ref 0.0–0.2)

## 2019-03-29 LAB — COMPREHENSIVE METABOLIC PANEL
ALT: 1030 U/L — ABNORMAL HIGH (ref 0–44)
AST: 212 U/L — ABNORMAL HIGH (ref 15–41)
Albumin: 2.2 g/dL — ABNORMAL LOW (ref 3.5–5.0)
Alkaline Phosphatase: 68 U/L (ref 38–126)
Anion gap: 15 (ref 5–15)
BUN: 79 mg/dL — ABNORMAL HIGH (ref 6–20)
CO2: 17 mmol/L — ABNORMAL LOW (ref 22–32)
Calcium: 7.7 mg/dL — ABNORMAL LOW (ref 8.9–10.3)
Chloride: 111 mmol/L (ref 98–111)
Creatinine, Ser: 1.47 mg/dL — ABNORMAL HIGH (ref 0.44–1.00)
GFR calc Af Amer: 45 mL/min — ABNORMAL LOW (ref >60)
GFR calc non Af Amer: 39 mL/min — ABNORMAL LOW (ref >60)
Glucose, Bld: 274 mg/dL — ABNORMAL HIGH (ref 70–99)
Potassium: 4.1 mmol/L (ref 3.5–5.1)
Sodium: 143 mmol/L (ref 135–145)
Total Bilirubin: 1.1 mg/dL (ref 0.3–1.2)
Total Protein: 4.8 g/dL — ABNORMAL LOW (ref 6.5–8.1)

## 2019-03-29 LAB — GLUCOSE, CAPILLARY
Glucose-Capillary: 112 mg/dL — ABNORMAL HIGH (ref 70–99)
Glucose-Capillary: 122 mg/dL — ABNORMAL HIGH (ref 70–99)
Glucose-Capillary: 134 mg/dL — ABNORMAL HIGH (ref 70–99)
Glucose-Capillary: 140 mg/dL — ABNORMAL HIGH (ref 70–99)
Glucose-Capillary: 231 mg/dL — ABNORMAL HIGH (ref 70–99)
Glucose-Capillary: 96 mg/dL (ref 70–99)

## 2019-03-29 LAB — PREPARE RBC (CROSSMATCH)

## 2019-03-29 LAB — HAPTOGLOBIN: Haptoglobin: 174 mg/dL (ref 33–346)

## 2019-03-29 IMAGING — XA IR ANGIO/ADD [PERSON_NAME]
10 of 16 series · 11 of 24 positions shown · IV contrast (IODINE)
Comparison: none

INDICATION: 58-year-old female with active COVID pneumonia and multifocal
intra-abdominal hemorrhage is of uncertain etiology. CTA
demonstrates some irregularity of the arteries concerning for COVID
related vasculitis. She presents for urgent angiogram and attempted
embolization.

[Series 13: body 4 care · 1 of 28 frames shown (1 of 10)]
[frame 5/28]
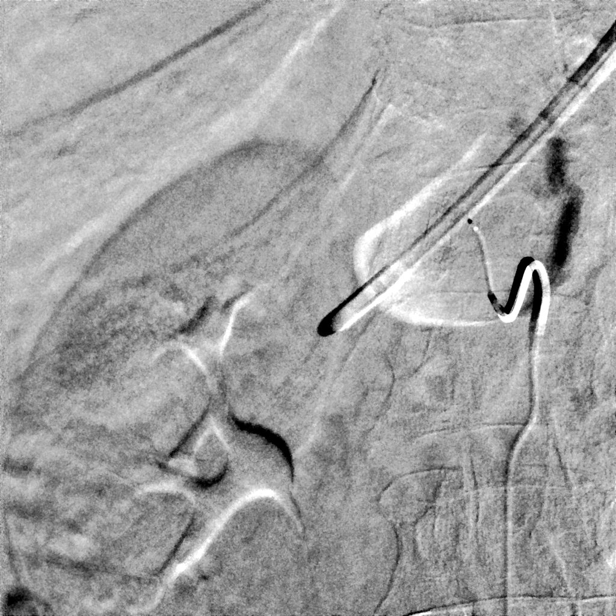

[Series 14: body 4 care · 6 acquisitions, 1 frame shown (2 of 10)]
[im 1/6]
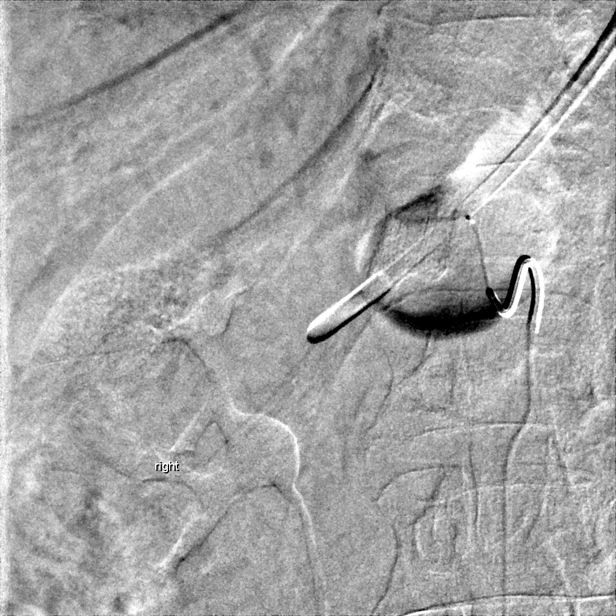

[Series 16: body 4 care · 8 acquisitions, 1 frame shown (3 of 10)]
[im 1/8]
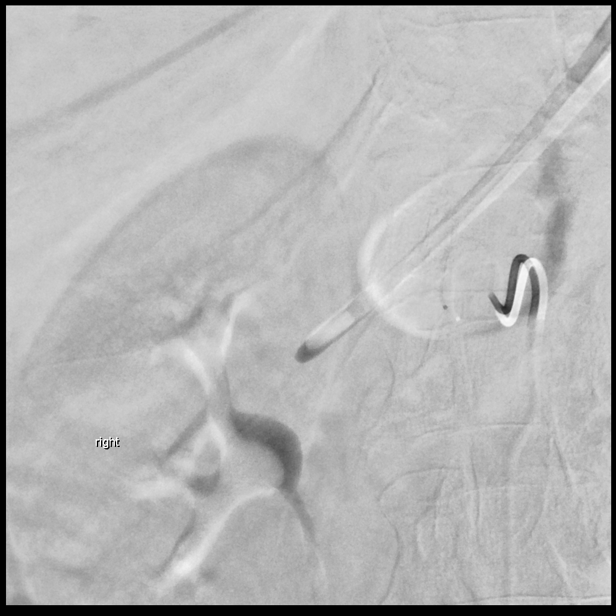

[Series 17: body 4 care · 12 acquisitions, 1 frame shown (4 of 10)]
[im 1/12]
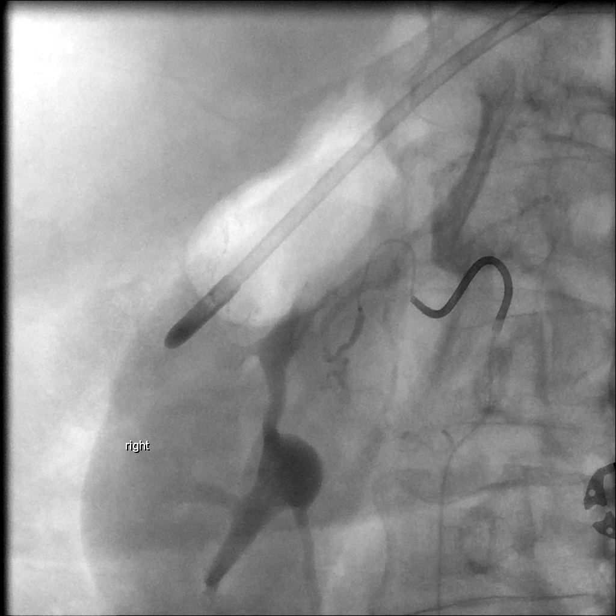

[Series 19: body 4 care · 6 acquisitions, 1 frame shown (5 of 10)]
[im 1/6]
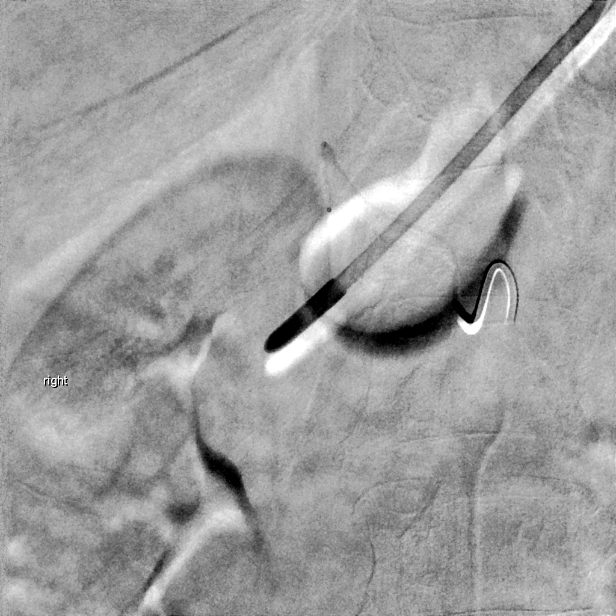

[Series 21: body 4 care · 13 acquisitions, 1 frame shown (6 of 10)]
[im 1/13]
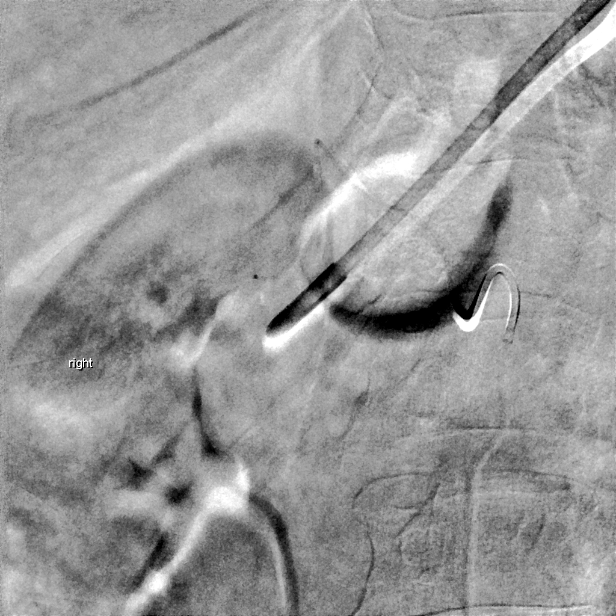

[Series 22: body 4 care · 1 of 58 frames shown (7 of 10)]
[frame 47/58]
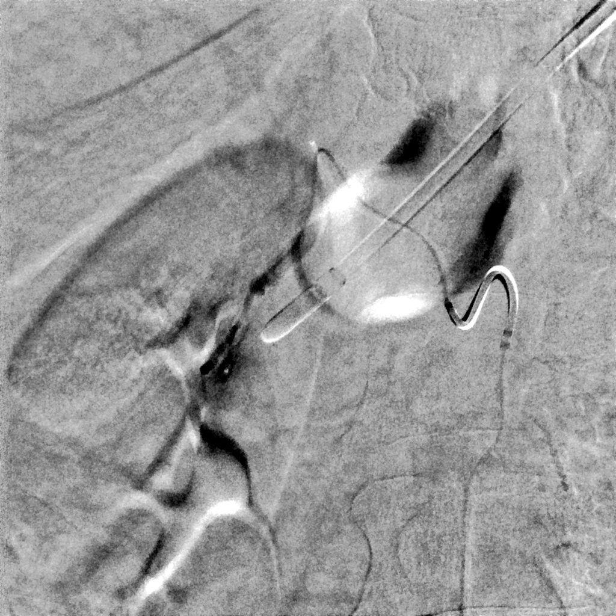

[Series 23: body 4 care · 40 acquisitions, 2 frames shown (8 of 10)]
[im 1/40]
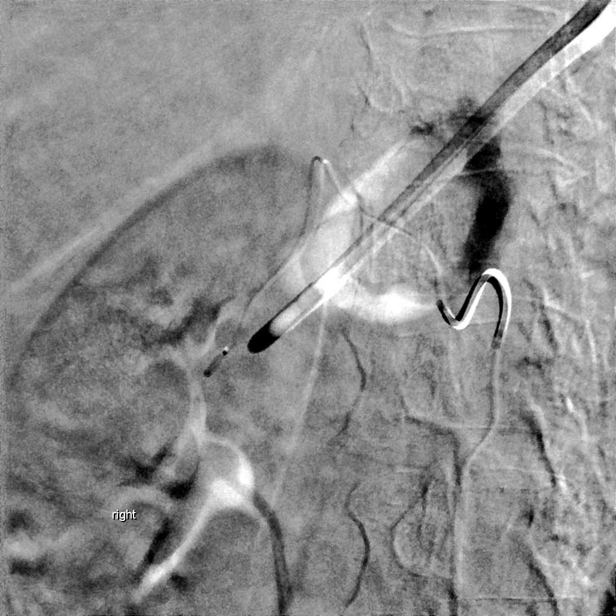
[im 40/40]
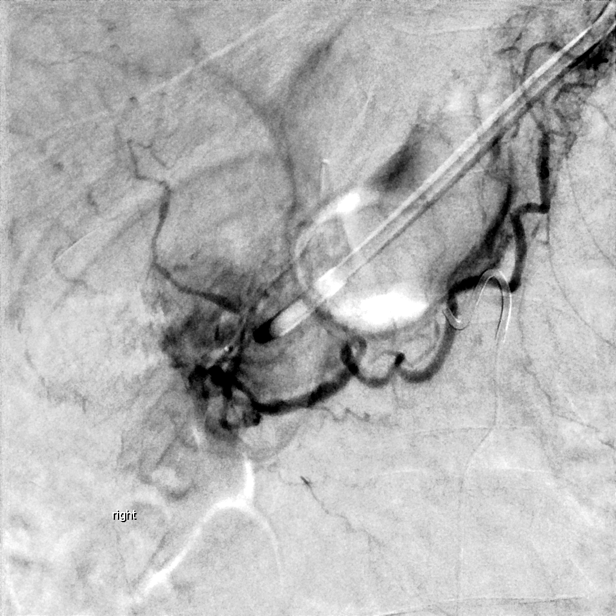

[Series 23: body 4 care · 1 of 40 slices shown (9 of 10)]
[im 30/40]
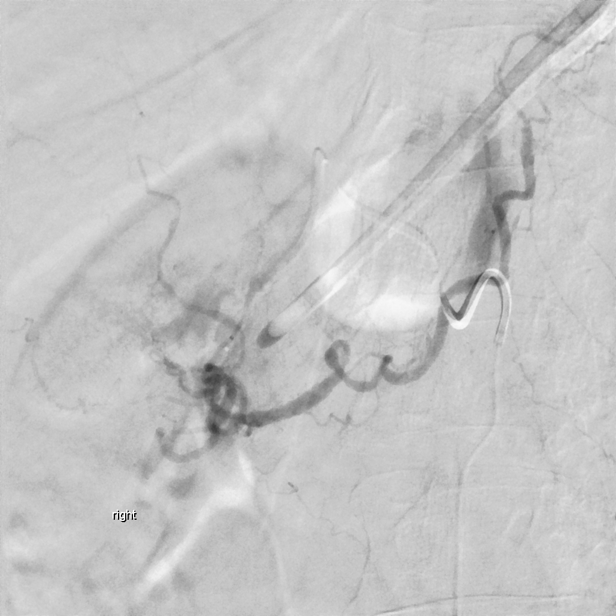

[Series 24: body 4 care · 9 acquisitions, 1 frame shown (10 of 10)]
[im 1/9]
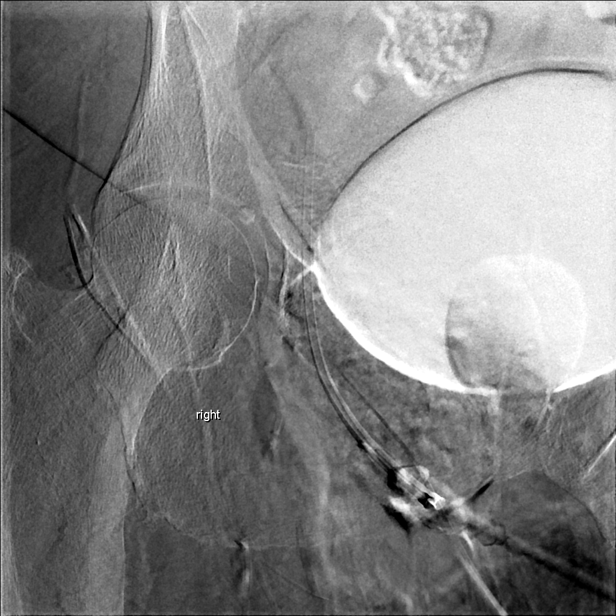

[11 of 24 positions shown; findings below may reference images not displayed]

EXAM:
SELECTIVE VISCERAL ARTERIOGRAPHY; ADDITIONAL ARTERIOGRAPHY; IR
ULTRASOUND GUIDANCE VASC ACCESS RIGHT

1. Ultrasound-guided arterial access
2. Catheterization of the superior mesenteric artery with
arteriogram
3. Catheterization of the celiac artery with arteriogram
4. Catheterization of the dorsal pancreatic artery with arteriogram
5. Catheterization of the common hepatic artery with arteriogram
6. Catheterization of the gastroduodenal artery with arteriogram
7. Catheterization of the right gastroepiploic artery with
arteriogram

MEDICATIONS:
None

ANESTHESIA/SEDATION:
Moderate (conscious) sedation was employed during this procedure. A
total of Versed 2 mg and Fentanyl 100 mcg was administered
intravenously.

Moderate Sedation Time: 69 minutes. The patient's level of
consciousness and vital signs were monitored continuously by
radiology nursing throughout the procedure under my direct
supervision.

CONTRAST:  75mL OMNIPAQUE IOHEXOL 300 MG/ML SOLN, 75mL OMNIPAQUE
IOHEXOL 300 MG/ML SOLN, 50mL OMNIPAQUE IOHEXOL 300 MG/ML SOLN

FLUOROSCOPY TIME:  Fluoroscopy Time: 24 minutes 18 seconds (3,084
mGy).

COMPLICATIONS:
None immediate.



The right common femoral artery was interrogated with ultrasound and
found to be widely patent. An image was obtained and stored for the
medical record. Local anesthesia was attained by infiltration with
1% lidocaine. A small dermatotomy was made. Under real-time
sonographic guidance, the vessel was punctured with a 21 gauge
micropuncture needle. Using standard technique, the initial micro
needle was exchanged over a 0.018 micro wire for a transitional 4
French micro sheath. The micro sheath was then exchanged over a
0.035 wire for a 5 French vascular sheath.

A C2 cobra catheter was advanced over a Bentson wire into the
abdominal aorta. The catheter was used to select the superior
mesenteric artery. The findings are highly abnormal. There is
significant hypertrophy and tortuosity of the pancreaticoduodenal
arcade. Multiple of the vessels demonstrate an irregular beaded
appearance with areas of focal narrowing and fusiform dilation. Flow
passes directly through the pancreaticoduodenal arcade into the
gastroduodenal artery and then into the gastric and hepatic vascular
beds. This suggests flow limiting stenosis or occlusion at the
origin of the celiac artery. The middle colic artery is highly
abnormal. The artery is nearly occluded proximally and spasmed to a
thread like caliber followed by focal dilation up to 2 mm.

Extensive attempts were then made to catheterize the celiac artery
first using a C2 cobra catheter then using a Sos Omni selective
catheter. Ultimately, a Mickelson catheter was successfully engaged
into the origin of the celiac artery. The origin is stenotic and
nearly occlusive by the presence of the 5 French catheter.
Arteriography was again performed demonstrating diffusely abnormal
arterial structures. The left gastric artery is stenotic and
irregular proximally before becoming aneurysmally dilated. The
common hepatic and proper hepatic artery is aneurysmal. No antegrade
flow identified into the gastroduodenal or dorsal pancreatic
arteries which were easily visualized on the SMA injection. This
suggests that the direction of flow is from the SMA into the celiac
territory.

An attempt was made to position the Mickelson catheter deeper into
the celiac artery, however the vessels are extremely fragile and
manipulation resulted in a focal non flow limiting dissection. The
catheter was brought back and perched just within the origin. A
renegade STC microcatheter was carefully advanced over a Fathom 16
wire. The microcatheter was advanced into the dorsal pancreatic
artery. Arteriography was performed. Injection into the dorsal
pancreatic artery was challenging given that inflow was coming from
the SMA artery through the pancreaticoduodenal arcade. No evidence
of focal hemorrhage or targetable segment for embolization.

The microcatheter was brought back into the celiac artery and next
advanced into the common hepatic artery. Arteriography was
performed. This demonstrates focal mild fusiform aneurysmal
dilatation of the hepatic artery. The origin of the gastroduodenal
artery could not be visualized secondary to inflow from the
pancreaticoduodenal arcade. Using landmarks, the microcatheter was
successfully advanced into the vessel. Arteriography was performed.
The vessel is mildly fusiform dilated. The microcatheter was further
advanced beyond the gastroduodenal artery and into the next arterial
bed. Arteriography was performed confirming that the catheter is
within the gastroepiploic artery. Again, the artery is diffusely
abnormal with multiple areas of focal narrowing and mild fusiform
dilation. Numerous additional unnamed branch vessels are also
present all of which appear abnormal.

Given that there is no evidence of active extravasation or
hemorrhage from any of the interrogated arteries and that the
underlying arterial problem appears to be a diffuse visceral
vasculitis, there is no target for prophylactic embolization. As
such, no further intervention will be performed.

The catheters were removed. Hemostasis was attained with the
assistance of a Cordis Exoseal extra arterial vascular plug.
IMPRESSION: 1. Diffuse vasculitis involving the small and medium arteries of the
visceral arterial tree. Affected arteries include the middle colic
artery, the entire pancreaticoduodenal arcade, the celiac artery,
the left gastric artery, the hepatic artery, the gastroduodenal
artery, the right gastroepiploic artery and the dorsal pancreatic
artery.
2. There is no evidence of active hemorrhage at this time.
3. Given the diffuse nature of the process, there is no focal area
amenable to a meaningful prophylactic embolization. The entire
visceral arterial tree is currently inflamed, irregular and at risk
for spontaneous hemorrhage.

PLAN:
*Unfortunately, endovascular intervention, and very likely surgical
intervention would not be of meaningful benefit.
*Recommend continuing to trend H and H and transfuse as needed in
addition to supportive care.

## 2019-03-29 IMAGING — XA IR ANGIO/ADD [PERSON_NAME]
11 of 24 series · 11 of 24 positions shown · IV contrast (IODINE)
Comparison: none

INDICATION: 58-year-old female with active COVID pneumonia and multifocal
intra-abdominal hemorrhage is of uncertain etiology. CTA
demonstrates some irregularity of the arteries concerning for COVID
related vasculitis. She presents for urgent angiogram and attempted
embolization.

[Series 2: body 4 care · 21 acquisitions, 1 frame shown (1 of 11)]
[im 1/21]
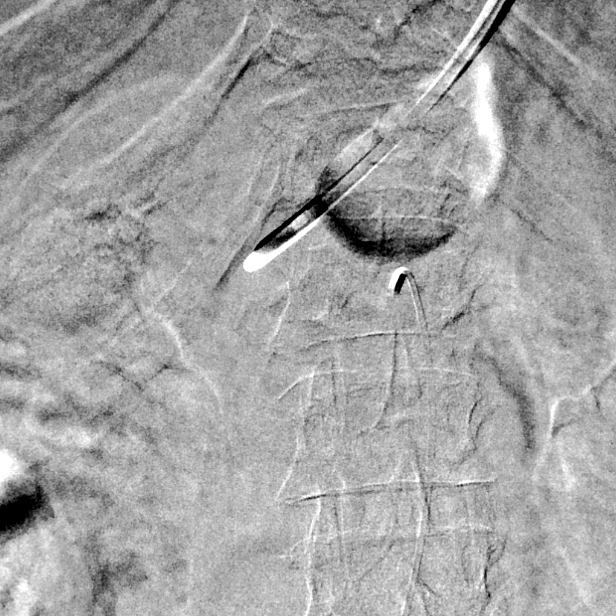

[Series 4: body 4 care · 8 acquisitions, 1 frame shown (2 of 11)]
[im 1/8]
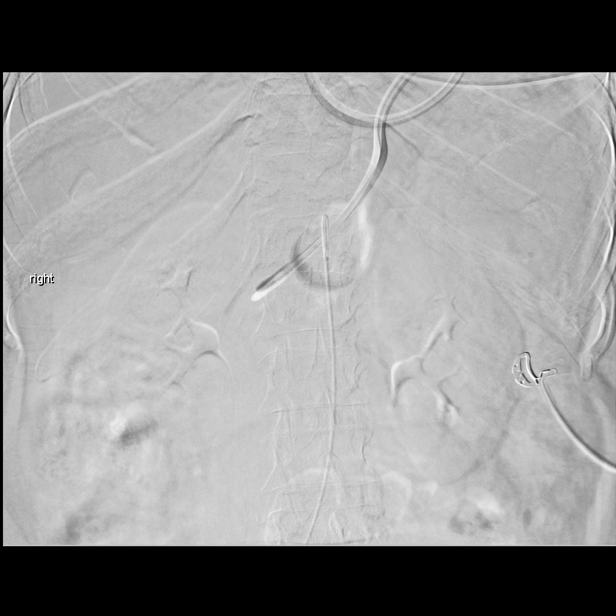

[Series 6: body 4 care · 2 acquisitions, 1 frame shown (3 of 11)]
[im 1/2]
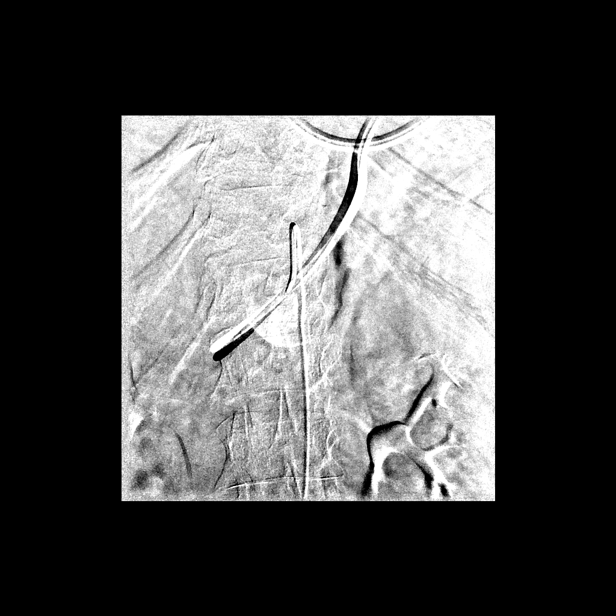

[Series 8: body 4 care · 13 acquisitions, 1 frame shown (4 of 11)]
[im 1/13]
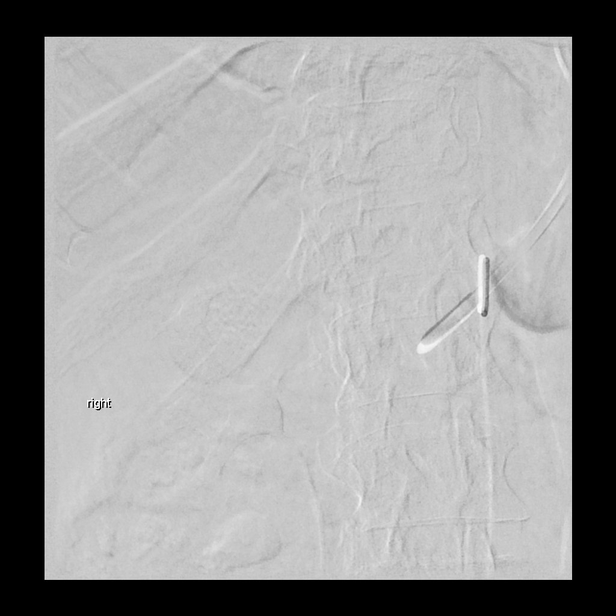

[Series 10: body 4 care · 5 acquisitions, 1 frame shown (5 of 11)]
[im 1/5]
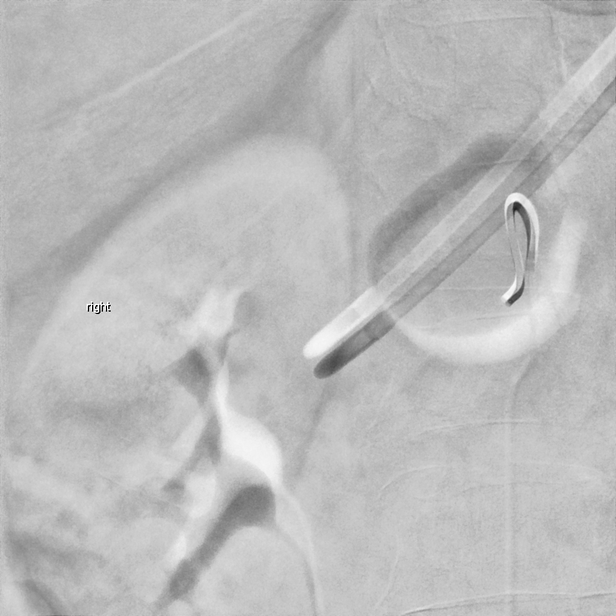

[Series 13: body 4 care · 1 of 28 frames shown (6 of 11)]
[frame 15/28]
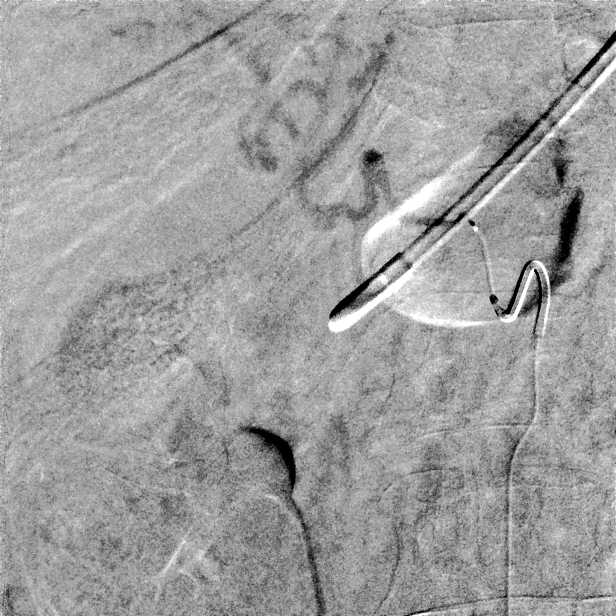

[Series 15: body 4 care · 18 acquisitions, 1 frame shown (7 of 11)]
[im 1/18]
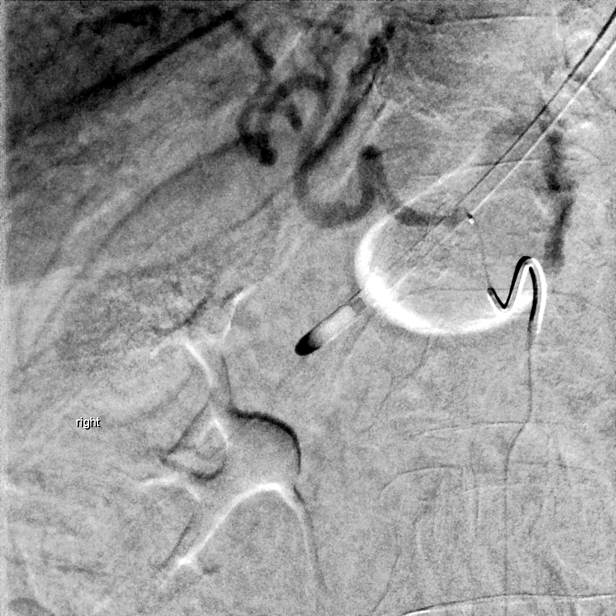

[Series 17: body 4 care · 12 acquisitions, 1 frame shown (8 of 11)]
[im 1/12]
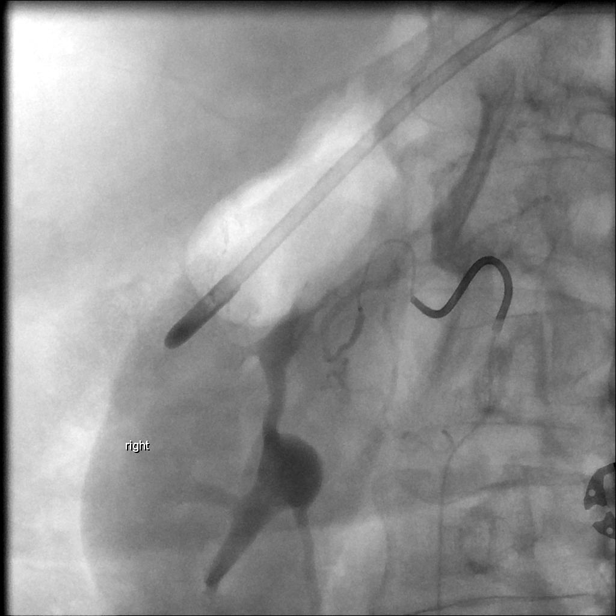

[Series 19: body 4 care · 6 acquisitions, 1 frame shown (9 of 11)]
[im 1/6]
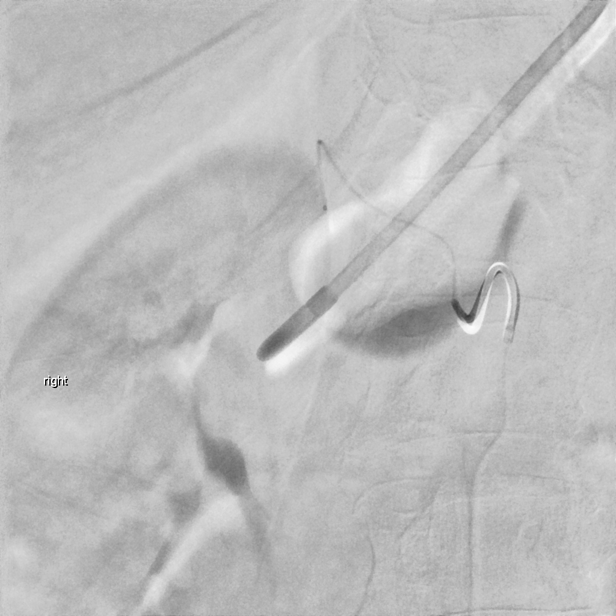

[Series 21: body 4 care · 13 acquisitions, 1 frame shown (10 of 11)]
[im 1/13]
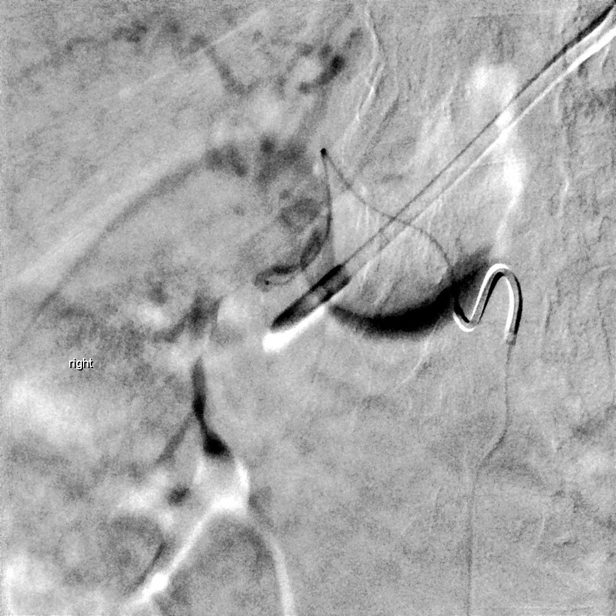

[Series 23: body 4 care · 40 acquisitions, 1 frame shown (11 of 11)]
[im 1/40]
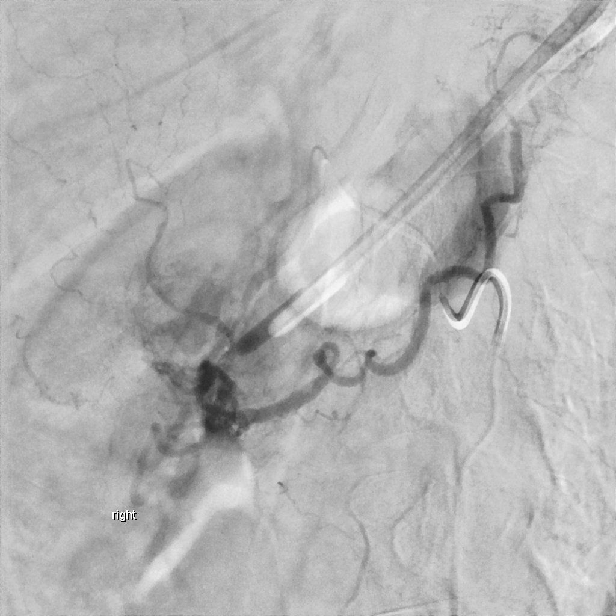

[11 of 24 positions shown; findings below may reference images not displayed]

EXAM:
SELECTIVE VISCERAL ARTERIOGRAPHY; ADDITIONAL ARTERIOGRAPHY; IR
ULTRASOUND GUIDANCE VASC ACCESS RIGHT

1. Ultrasound-guided arterial access
2. Catheterization of the superior mesenteric artery with
arteriogram
3. Catheterization of the celiac artery with arteriogram
4. Catheterization of the dorsal pancreatic artery with arteriogram
5. Catheterization of the common hepatic artery with arteriogram
6. Catheterization of the gastroduodenal artery with arteriogram
7. Catheterization of the right gastroepiploic artery with
arteriogram

MEDICATIONS:
None

ANESTHESIA/SEDATION:
Moderate (conscious) sedation was employed during this procedure. A
total of Versed 2 mg and Fentanyl 100 mcg was administered
intravenously.

Moderate Sedation Time: 69 minutes. The patient's level of
consciousness and vital signs were monitored continuously by
radiology nursing throughout the procedure under my direct
supervision.

CONTRAST:  75mL OMNIPAQUE IOHEXOL 300 MG/ML SOLN, 75mL OMNIPAQUE
IOHEXOL 300 MG/ML SOLN, 50mL OMNIPAQUE IOHEXOL 300 MG/ML SOLN

FLUOROSCOPY TIME:  Fluoroscopy Time: 24 minutes 18 seconds (3,084
mGy).

COMPLICATIONS:
None immediate.



The right common femoral artery was interrogated with ultrasound and
found to be widely patent. An image was obtained and stored for the
medical record. Local anesthesia was attained by infiltration with
1% lidocaine. A small dermatotomy was made. Under real-time
sonographic guidance, the vessel was punctured with a 21 gauge
micropuncture needle. Using standard technique, the initial micro
needle was exchanged over a 0.018 micro wire for a transitional 4
French micro sheath. The micro sheath was then exchanged over a
0.035 wire for a 5 French vascular sheath.

A C2 cobra catheter was advanced over a Bentson wire into the
abdominal aorta. The catheter was used to select the superior
mesenteric artery. The findings are highly abnormal. There is
significant hypertrophy and tortuosity of the pancreaticoduodenal
arcade. Multiple of the vessels demonstrate an irregular beaded
appearance with areas of focal narrowing and fusiform dilation. Flow
passes directly through the pancreaticoduodenal arcade into the
gastroduodenal artery and then into the gastric and hepatic vascular
beds. This suggests flow limiting stenosis or occlusion at the
origin of the celiac artery. The middle colic artery is highly
abnormal. The artery is nearly occluded proximally and spasmed to a
thread like caliber followed by focal dilation up to 2 mm.

Extensive attempts were then made to catheterize the celiac artery
first using a C2 cobra catheter then using a Sos Omni selective
catheter. Ultimately, a Mickelson catheter was successfully engaged
into the origin of the celiac artery. The origin is stenotic and
nearly occlusive by the presence of the 5 French catheter.
Arteriography was again performed demonstrating diffusely abnormal
arterial structures. The left gastric artery is stenotic and
irregular proximally before becoming aneurysmally dilated. The
common hepatic and proper hepatic artery is aneurysmal. No antegrade
flow identified into the gastroduodenal or dorsal pancreatic
arteries which were easily visualized on the SMA injection. This
suggests that the direction of flow is from the SMA into the celiac
territory.

An attempt was made to position the Mickelson catheter deeper into
the celiac artery, however the vessels are extremely fragile and
manipulation resulted in a focal non flow limiting dissection. The
catheter was brought back and perched just within the origin. A
renegade STC microcatheter was carefully advanced over a Fathom 16
wire. The microcatheter was advanced into the dorsal pancreatic
artery. Arteriography was performed. Injection into the dorsal
pancreatic artery was challenging given that inflow was coming from
the SMA artery through the pancreaticoduodenal arcade. No evidence
of focal hemorrhage or targetable segment for embolization.

The microcatheter was brought back into the celiac artery and next
advanced into the common hepatic artery. Arteriography was
performed. This demonstrates focal mild fusiform aneurysmal
dilatation of the hepatic artery. The origin of the gastroduodenal
artery could not be visualized secondary to inflow from the
pancreaticoduodenal arcade. Using landmarks, the microcatheter was
successfully advanced into the vessel. Arteriography was performed.
The vessel is mildly fusiform dilated. The microcatheter was further
advanced beyond the gastroduodenal artery and into the next arterial
bed. Arteriography was performed confirming that the catheter is
within the gastroepiploic artery. Again, the artery is diffusely
abnormal with multiple areas of focal narrowing and mild fusiform
dilation. Numerous additional unnamed branch vessels are also
present all of which appear abnormal.

Given that there is no evidence of active extravasation or
hemorrhage from any of the interrogated arteries and that the
underlying arterial problem appears to be a diffuse visceral
vasculitis, there is no target for prophylactic embolization. As
such, no further intervention will be performed.

The catheters were removed. Hemostasis was attained with the
assistance of a Cordis Exoseal extra arterial vascular plug.
IMPRESSION: 1. Diffuse vasculitis involving the small and medium arteries of the
visceral arterial tree. Affected arteries include the middle colic
artery, the entire pancreaticoduodenal arcade, the celiac artery,
the left gastric artery, the hepatic artery, the gastroduodenal
artery, the right gastroepiploic artery and the dorsal pancreatic
artery.
2. There is no evidence of active hemorrhage at this time.
3. Given the diffuse nature of the process, there is no focal area
amenable to a meaningful prophylactic embolization. The entire
visceral arterial tree is currently inflamed, irregular and at risk
for spontaneous hemorrhage.

PLAN:
*Unfortunately, endovascular intervention, and very likely surgical
intervention would not be of meaningful benefit.
*Recommend continuing to trend H and H and transfuse as needed in
addition to supportive care.

## 2019-03-29 IMAGING — XA IR US GUIDE VASC ACCESS RIGHT
11 of 24 series · 11 of 24 positions shown · IV contrast (IODINE)
Comparison: none

INDICATION: 58-year-old female with active COVID pneumonia and multifocal
intra-abdominal hemorrhage is of uncertain etiology. CTA
demonstrates some irregularity of the arteries concerning for COVID
related vasculitis. She presents for urgent angiogram and attempted
embolization.

[Series 2: body 4 care · 21 acquisitions, 1 frame shown (1 of 11)]
[im 1/21]
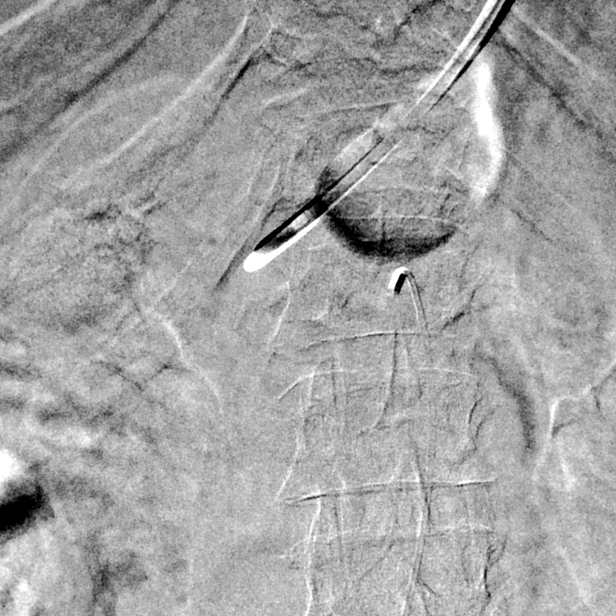

[Series 4: body 4 care · 8 acquisitions, 1 frame shown (2 of 11)]
[im 1/8]
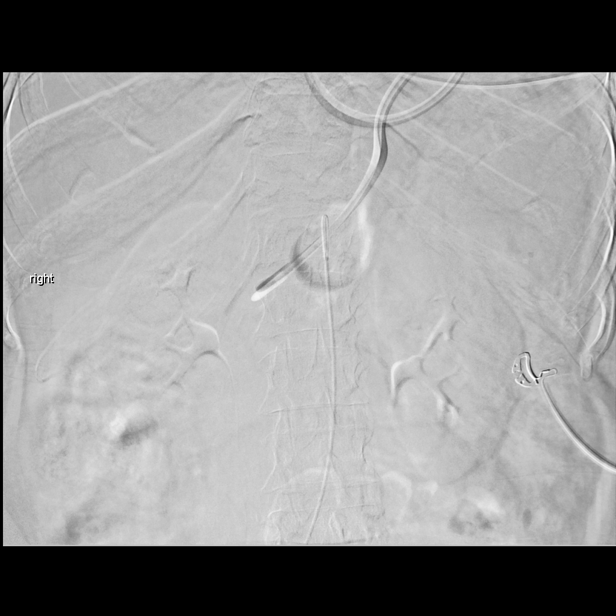

[Series 6: body 4 care · 2 acquisitions, 1 frame shown (3 of 11)]
[im 1/2]
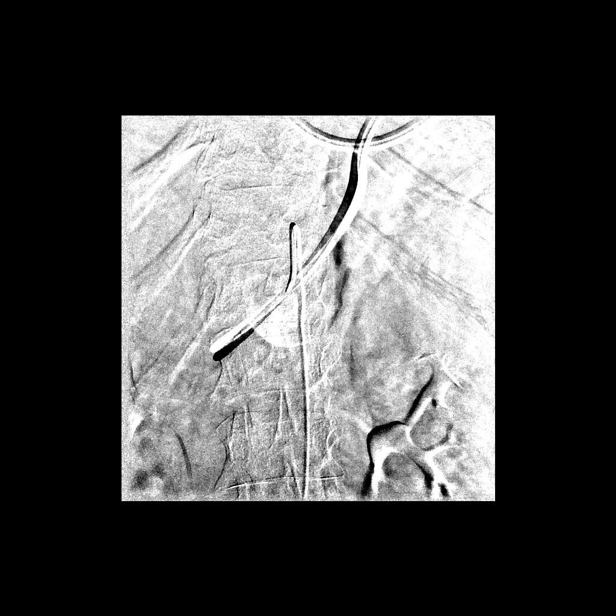

[Series 8: body 4 care · 13 acquisitions, 1 frame shown (4 of 11)]
[im 1/13]
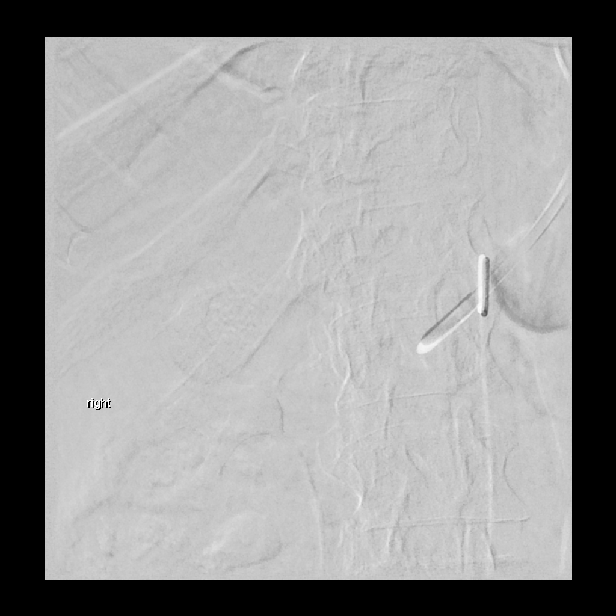

[Series 10: body 4 care · 5 acquisitions, 1 frame shown (5 of 11)]
[im 1/5]
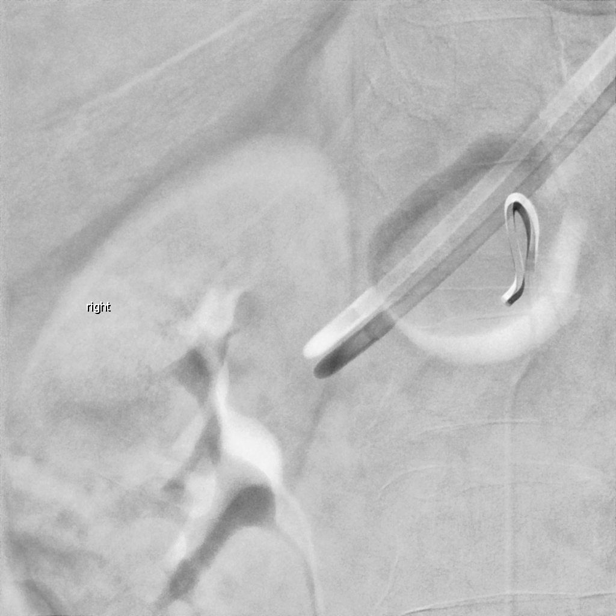

[Series 13: body 4 care · 1 of 28 frames shown (6 of 11)]
[frame 15/28]
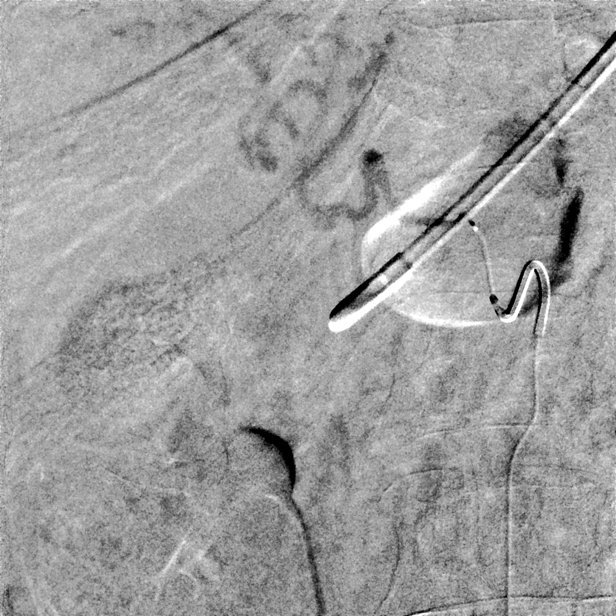

[Series 15: body 4 care · 18 acquisitions, 1 frame shown (7 of 11)]
[im 1/18]
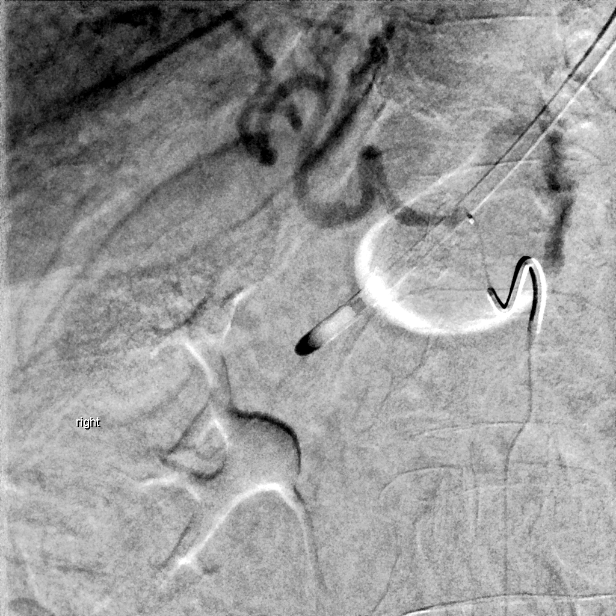

[Series 17: body 4 care · 12 acquisitions, 1 frame shown (8 of 11)]
[im 1/12]
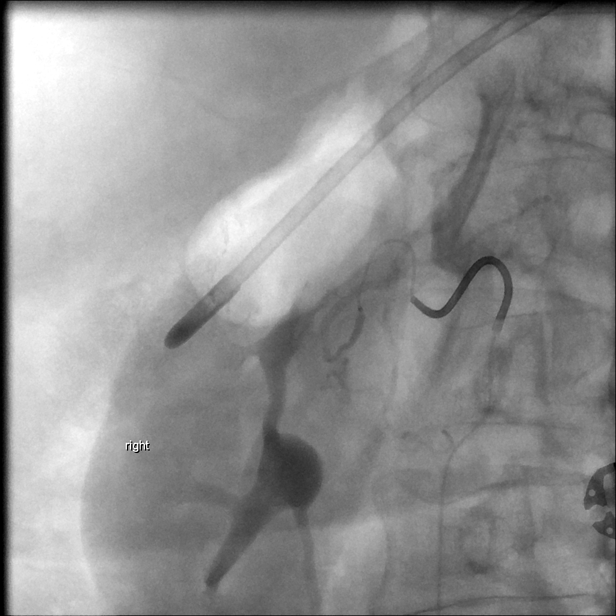

[Series 19: body 4 care · 6 acquisitions, 1 frame shown (9 of 11)]
[im 1/6]
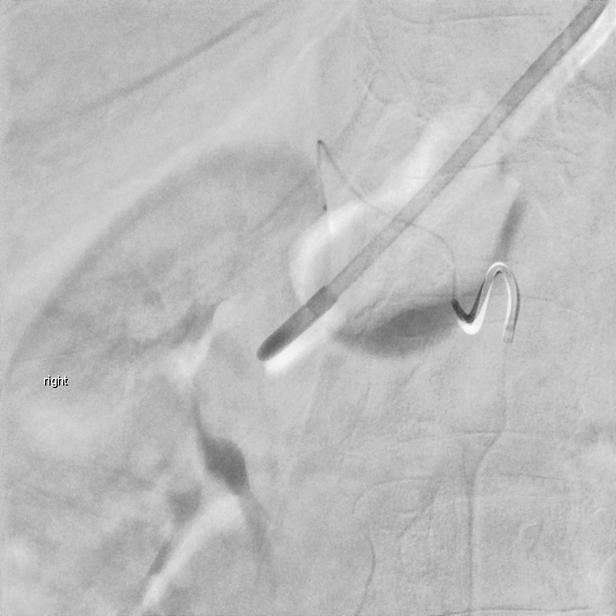

[Series 21: body 4 care · 13 acquisitions, 1 frame shown (10 of 11)]
[im 1/13]
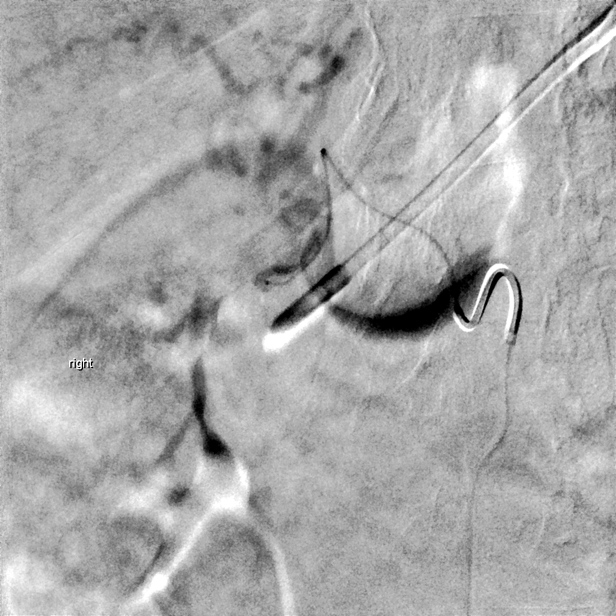

[Series 23: body 4 care · 40 acquisitions, 1 frame shown (11 of 11)]
[im 1/40]
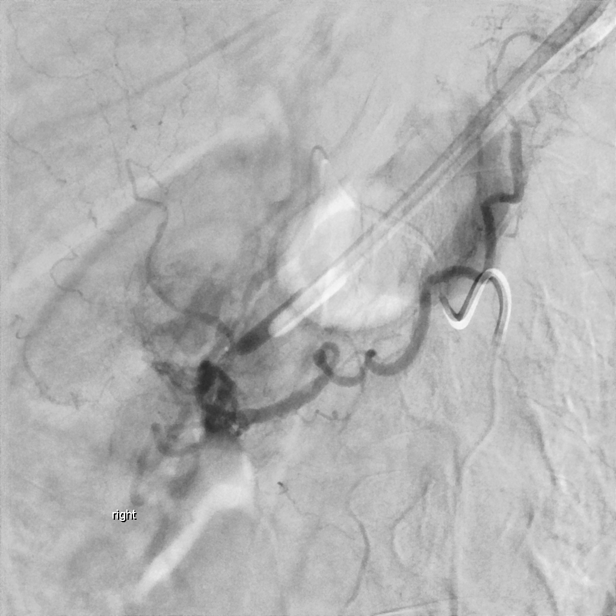

[11 of 24 positions shown; findings below may reference images not displayed]

EXAM:
SELECTIVE VISCERAL ARTERIOGRAPHY; ADDITIONAL ARTERIOGRAPHY; IR
ULTRASOUND GUIDANCE VASC ACCESS RIGHT

1. Ultrasound-guided arterial access
2. Catheterization of the superior mesenteric artery with
arteriogram
3. Catheterization of the celiac artery with arteriogram
4. Catheterization of the dorsal pancreatic artery with arteriogram
5. Catheterization of the common hepatic artery with arteriogram
6. Catheterization of the gastroduodenal artery with arteriogram
7. Catheterization of the right gastroepiploic artery with
arteriogram

MEDICATIONS:
None

ANESTHESIA/SEDATION:
Moderate (conscious) sedation was employed during this procedure. A
total of Versed 2 mg and Fentanyl 100 mcg was administered
intravenously.

Moderate Sedation Time: 69 minutes. The patient's level of
consciousness and vital signs were monitored continuously by
radiology nursing throughout the procedure under my direct
supervision.

CONTRAST:  75mL OMNIPAQUE IOHEXOL 300 MG/ML SOLN, 75mL OMNIPAQUE
IOHEXOL 300 MG/ML SOLN, 50mL OMNIPAQUE IOHEXOL 300 MG/ML SOLN

FLUOROSCOPY TIME:  Fluoroscopy Time: 24 minutes 18 seconds (3,084
mGy).

COMPLICATIONS:
None immediate.



The right common femoral artery was interrogated with ultrasound and
found to be widely patent. An image was obtained and stored for the
medical record. Local anesthesia was attained by infiltration with
1% lidocaine. A small dermatotomy was made. Under real-time
sonographic guidance, the vessel was punctured with a 21 gauge
micropuncture needle. Using standard technique, the initial micro
needle was exchanged over a 0.018 micro wire for a transitional 4
French micro sheath. The micro sheath was then exchanged over a
0.035 wire for a 5 French vascular sheath.

A C2 cobra catheter was advanced over a Bentson wire into the
abdominal aorta. The catheter was used to select the superior
mesenteric artery. The findings are highly abnormal. There is
significant hypertrophy and tortuosity of the pancreaticoduodenal
arcade. Multiple of the vessels demonstrate an irregular beaded
appearance with areas of focal narrowing and fusiform dilation. Flow
passes directly through the pancreaticoduodenal arcade into the
gastroduodenal artery and then into the gastric and hepatic vascular
beds. This suggests flow limiting stenosis or occlusion at the
origin of the celiac artery. The middle colic artery is highly
abnormal. The artery is nearly occluded proximally and spasmed to a
thread like caliber followed by focal dilation up to 2 mm.

Extensive attempts were then made to catheterize the celiac artery
first using a C2 cobra catheter then using a Sos Omni selective
catheter. Ultimately, a Mickelson catheter was successfully engaged
into the origin of the celiac artery. The origin is stenotic and
nearly occlusive by the presence of the 5 French catheter.
Arteriography was again performed demonstrating diffusely abnormal
arterial structures. The left gastric artery is stenotic and
irregular proximally before becoming aneurysmally dilated. The
common hepatic and proper hepatic artery is aneurysmal. No antegrade
flow identified into the gastroduodenal or dorsal pancreatic
arteries which were easily visualized on the SMA injection. This
suggests that the direction of flow is from the SMA into the celiac
territory.

An attempt was made to position the Mickelson catheter deeper into
the celiac artery, however the vessels are extremely fragile and
manipulation resulted in a focal non flow limiting dissection. The
catheter was brought back and perched just within the origin. A
renegade STC microcatheter was carefully advanced over a Fathom 16
wire. The microcatheter was advanced into the dorsal pancreatic
artery. Arteriography was performed. Injection into the dorsal
pancreatic artery was challenging given that inflow was coming from
the SMA artery through the pancreaticoduodenal arcade. No evidence
of focal hemorrhage or targetable segment for embolization.

The microcatheter was brought back into the celiac artery and next
advanced into the common hepatic artery. Arteriography was
performed. This demonstrates focal mild fusiform aneurysmal
dilatation of the hepatic artery. The origin of the gastroduodenal
artery could not be visualized secondary to inflow from the
pancreaticoduodenal arcade. Using landmarks, the microcatheter was
successfully advanced into the vessel. Arteriography was performed.
The vessel is mildly fusiform dilated. The microcatheter was further
advanced beyond the gastroduodenal artery and into the next arterial
bed. Arteriography was performed confirming that the catheter is
within the gastroepiploic artery. Again, the artery is diffusely
abnormal with multiple areas of focal narrowing and mild fusiform
dilation. Numerous additional unnamed branch vessels are also
present all of which appear abnormal.

Given that there is no evidence of active extravasation or
hemorrhage from any of the interrogated arteries and that the
underlying arterial problem appears to be a diffuse visceral
vasculitis, there is no target for prophylactic embolization. As
such, no further intervention will be performed.

The catheters were removed. Hemostasis was attained with the
assistance of a Cordis Exoseal extra arterial vascular plug.
IMPRESSION: 1. Diffuse vasculitis involving the small and medium arteries of the
visceral arterial tree. Affected arteries include the middle colic
artery, the entire pancreaticoduodenal arcade, the celiac artery,
the left gastric artery, the hepatic artery, the gastroduodenal
artery, the right gastroepiploic artery and the dorsal pancreatic
artery.
2. There is no evidence of active hemorrhage at this time.
3. Given the diffuse nature of the process, there is no focal area
amenable to a meaningful prophylactic embolization. The entire
visceral arterial tree is currently inflamed, irregular and at risk
for spontaneous hemorrhage.

PLAN:
*Unfortunately, endovascular intervention, and very likely surgical
intervention would not be of meaningful benefit.
*Recommend continuing to trend H and H and transfuse as needed in
addition to supportive care.

## 2019-03-29 IMAGING — XA IR ANGIO/VISCERAL SELECTIVE EA VESSEL WO/W FLUSH
11 of 24 series · 11 of 24 positions shown · IV contrast (IODINE)
Comparison: none

INDICATION: 58-year-old female with active COVID pneumonia and multifocal
intra-abdominal hemorrhage is of uncertain etiology. CTA
demonstrates some irregularity of the arteries concerning for COVID
related vasculitis. She presents for urgent angiogram and attempted
embolization.

[Series 1: ir angio/visceral selective ea vessel wo/w flush · 1 of 1 slices shown]
[im 1/1]
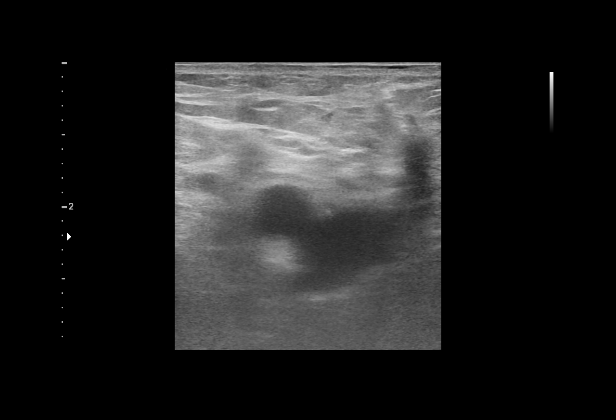

[Series 3: body 4 care · 7 acquisitions, 1 frame shown (1 of 10)]
[im 1/7]
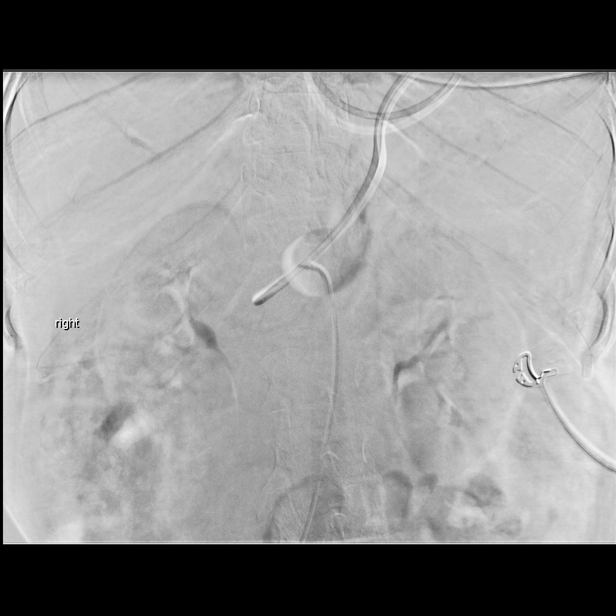

[Series 5: body 4 care · 6 acquisitions, 1 frame shown (2 of 10)]
[im 1/6]
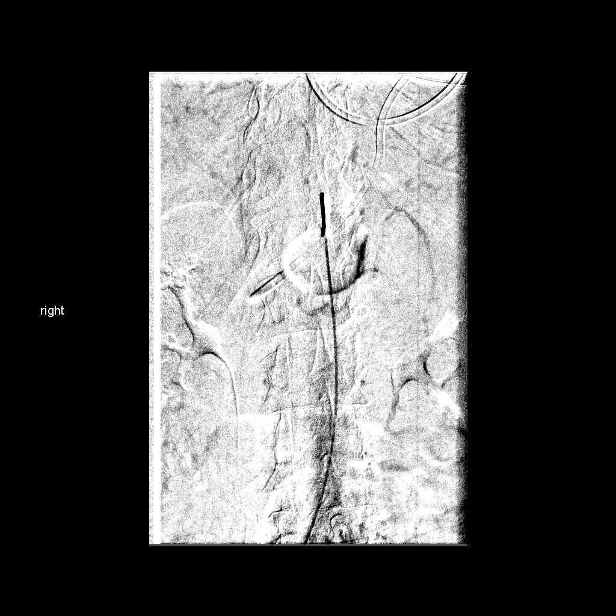

[Series 7: body 4 care · 11 acquisitions, 1 frame shown (3 of 10)]
[im 1/11]
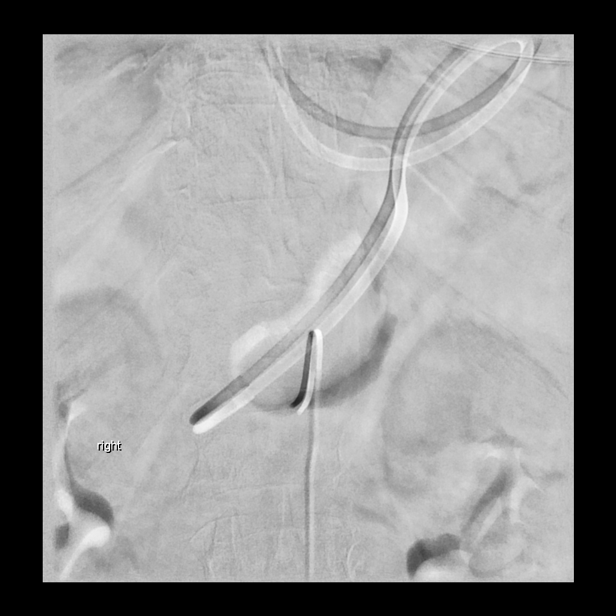

[Series 9: body 4 care · 14 acquisitions, 1 frame shown (4 of 10)]
[im 1/14]
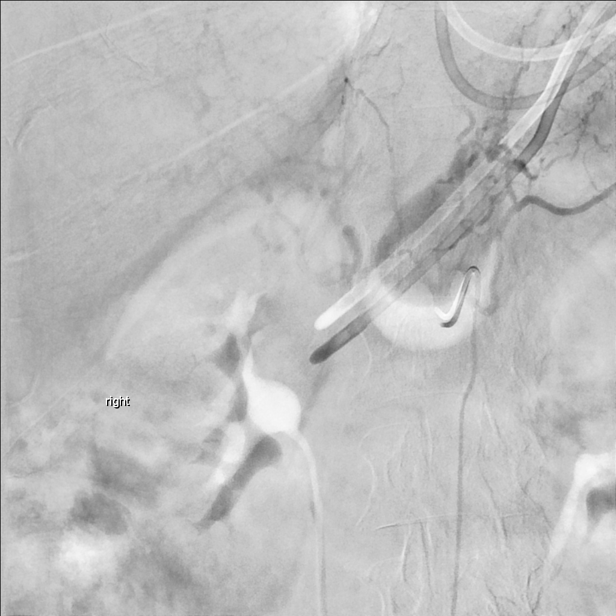

[Series 12: body 4 care · 5 acquisitions, 1 frame shown (5 of 10)]
[im 1/5]
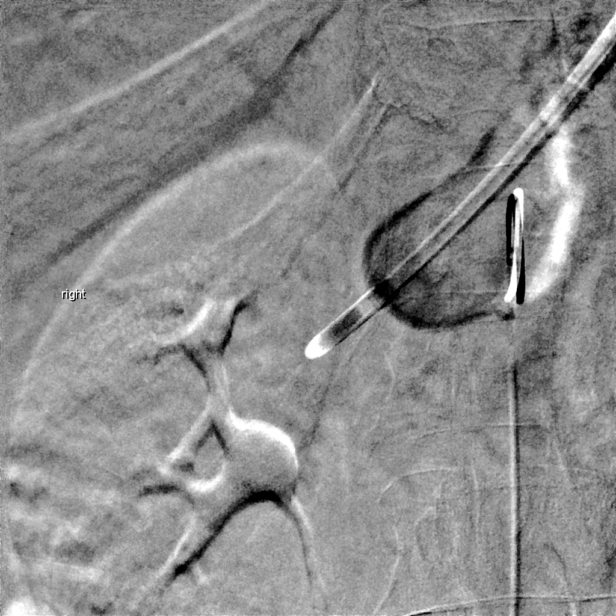

[Series 14: body 4 care · 6 acquisitions, 1 frame shown (6 of 10)]
[im 1/6]
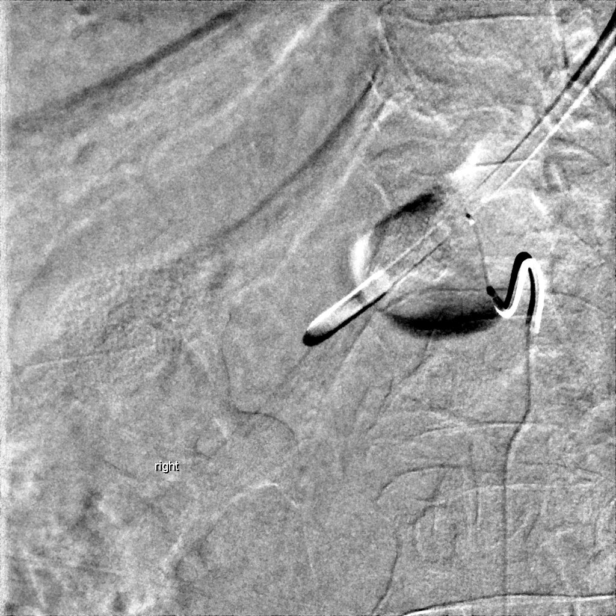

[Series 16: body 4 care · 8 acquisitions, 1 frame shown (7 of 10)]
[im 1/8]
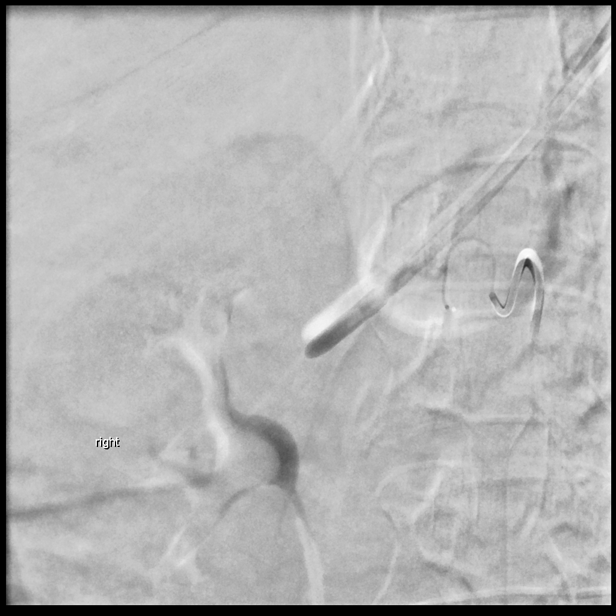

[Series 18: body 4 care · 10 acquisitions, 1 frame shown (8 of 10)]
[im 1/10]
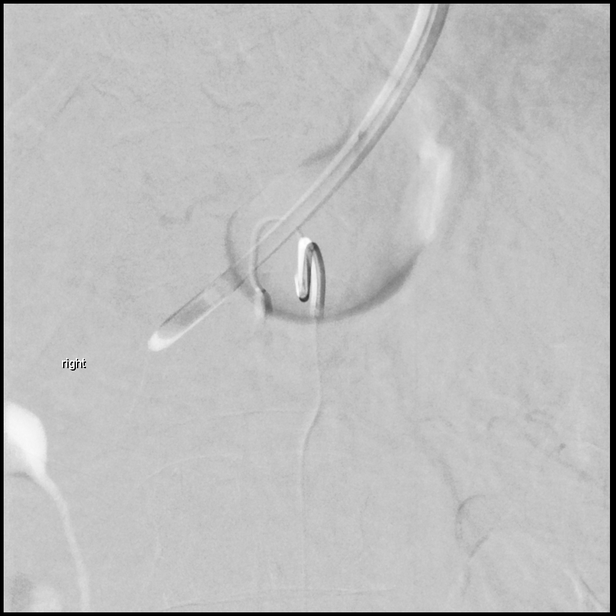

[Series 20: body 4 care · 20 acquisitions, 1 frame shown (9 of 10)]
[im 1/20]
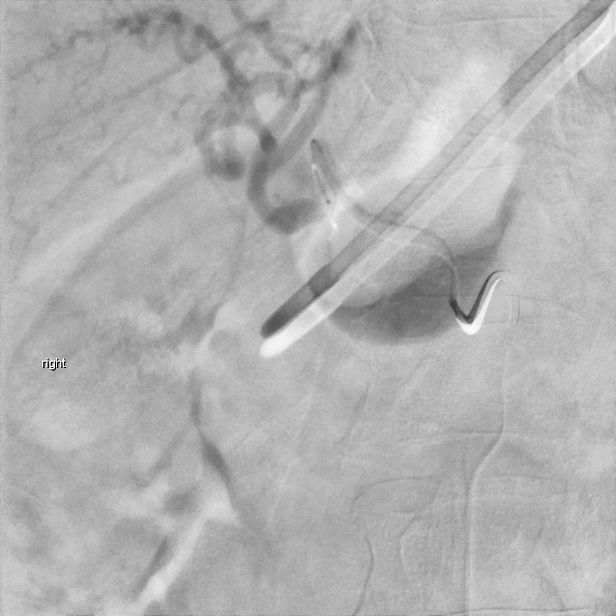

[Series 22: body 4 care · 1 of 58 frames shown (10 of 10)]
[frame 47/58]
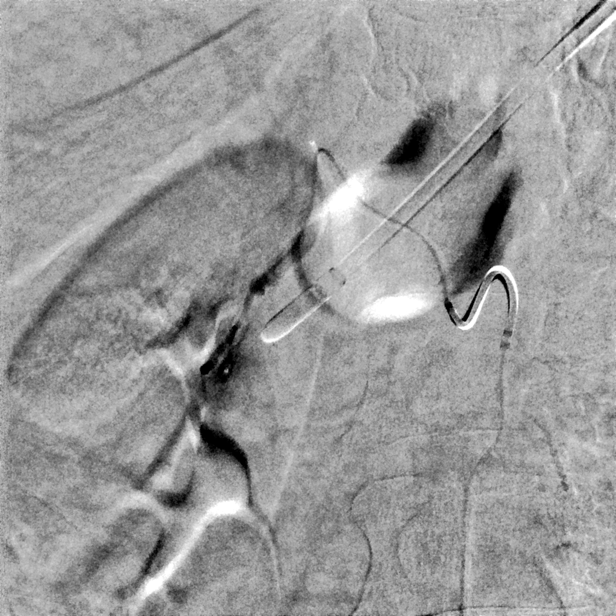

[11 of 24 positions shown; findings below may reference images not displayed]

EXAM:
SELECTIVE VISCERAL ARTERIOGRAPHY; ADDITIONAL ARTERIOGRAPHY; IR
ULTRASOUND GUIDANCE VASC ACCESS RIGHT

1. Ultrasound-guided arterial access
2. Catheterization of the superior mesenteric artery with
arteriogram
3. Catheterization of the celiac artery with arteriogram
4. Catheterization of the dorsal pancreatic artery with arteriogram
5. Catheterization of the common hepatic artery with arteriogram
6. Catheterization of the gastroduodenal artery with arteriogram
7. Catheterization of the right gastroepiploic artery with
arteriogram

MEDICATIONS:
None

ANESTHESIA/SEDATION:
Moderate (conscious) sedation was employed during this procedure. A
total of Versed 2 mg and Fentanyl 100 mcg was administered
intravenously.

Moderate Sedation Time: 69 minutes. The patient's level of
consciousness and vital signs were monitored continuously by
radiology nursing throughout the procedure under my direct
supervision.

CONTRAST:  75mL OMNIPAQUE IOHEXOL 300 MG/ML SOLN, 75mL OMNIPAQUE
IOHEXOL 300 MG/ML SOLN, 50mL OMNIPAQUE IOHEXOL 300 MG/ML SOLN

FLUOROSCOPY TIME:  Fluoroscopy Time: 24 minutes 18 seconds (3,084
mGy).

COMPLICATIONS:
None immediate.



The right common femoral artery was interrogated with ultrasound and
found to be widely patent. An image was obtained and stored for the
medical record. Local anesthesia was attained by infiltration with
1% lidocaine. A small dermatotomy was made. Under real-time
sonographic guidance, the vessel was punctured with a 21 gauge
micropuncture needle. Using standard technique, the initial micro
needle was exchanged over a 0.018 micro wire for a transitional 4
French micro sheath. The micro sheath was then exchanged over a
0.035 wire for a 5 French vascular sheath.

A C2 cobra catheter was advanced over a Bentson wire into the
abdominal aorta. The catheter was used to select the superior
mesenteric artery. The findings are highly abnormal. There is
significant hypertrophy and tortuosity of the pancreaticoduodenal
arcade. Multiple of the vessels demonstrate an irregular beaded
appearance with areas of focal narrowing and fusiform dilation. Flow
passes directly through the pancreaticoduodenal arcade into the
gastroduodenal artery and then into the gastric and hepatic vascular
beds. This suggests flow limiting stenosis or occlusion at the
origin of the celiac artery. The middle colic artery is highly
abnormal. The artery is nearly occluded proximally and spasmed to a
thread like caliber followed by focal dilation up to 2 mm.

Extensive attempts were then made to catheterize the celiac artery
first using a C2 cobra catheter then using a Sos Omni selective
catheter. Ultimately, a Mickelson catheter was successfully engaged
into the origin of the celiac artery. The origin is stenotic and
nearly occlusive by the presence of the 5 French catheter.
Arteriography was again performed demonstrating diffusely abnormal
arterial structures. The left gastric artery is stenotic and
irregular proximally before becoming aneurysmally dilated. The
common hepatic and proper hepatic artery is aneurysmal. No antegrade
flow identified into the gastroduodenal or dorsal pancreatic
arteries which were easily visualized on the SMA injection. This
suggests that the direction of flow is from the SMA into the celiac
territory.

An attempt was made to position the Mickelson catheter deeper into
the celiac artery, however the vessels are extremely fragile and
manipulation resulted in a focal non flow limiting dissection. The
catheter was brought back and perched just within the origin. A
renegade STC microcatheter was carefully advanced over a Fathom 16
wire. The microcatheter was advanced into the dorsal pancreatic
artery. Arteriography was performed. Injection into the dorsal
pancreatic artery was challenging given that inflow was coming from
the SMA artery through the pancreaticoduodenal arcade. No evidence
of focal hemorrhage or targetable segment for embolization.

The microcatheter was brought back into the celiac artery and next
advanced into the common hepatic artery. Arteriography was
performed. This demonstrates focal mild fusiform aneurysmal
dilatation of the hepatic artery. The origin of the gastroduodenal
artery could not be visualized secondary to inflow from the
pancreaticoduodenal arcade. Using landmarks, the microcatheter was
successfully advanced into the vessel. Arteriography was performed.
The vessel is mildly fusiform dilated. The microcatheter was further
advanced beyond the gastroduodenal artery and into the next arterial
bed. Arteriography was performed confirming that the catheter is
within the gastroepiploic artery. Again, the artery is diffusely
abnormal with multiple areas of focal narrowing and mild fusiform
dilation. Numerous additional unnamed branch vessels are also
present all of which appear abnormal.

Given that there is no evidence of active extravasation or
hemorrhage from any of the interrogated arteries and that the
underlying arterial problem appears to be a diffuse visceral
vasculitis, there is no target for prophylactic embolization. As
such, no further intervention will be performed.

The catheters were removed. Hemostasis was attained with the
assistance of a Cordis Exoseal extra arterial vascular plug.
IMPRESSION: 1. Diffuse vasculitis involving the small and medium arteries of the
visceral arterial tree. Affected arteries include the middle colic
artery, the entire pancreaticoduodenal arcade, the celiac artery,
the left gastric artery, the hepatic artery, the gastroduodenal
artery, the right gastroepiploic artery and the dorsal pancreatic
artery.
2. There is no evidence of active hemorrhage at this time.
3. Given the diffuse nature of the process, there is no focal area
amenable to a meaningful prophylactic embolization. The entire
visceral arterial tree is currently inflamed, irregular and at risk
for spontaneous hemorrhage.

PLAN:
*Unfortunately, endovascular intervention, and very likely surgical
intervention would not be of meaningful benefit.
*Recommend continuing to trend H and H and transfuse as needed in
addition to supportive care.

## 2019-03-29 IMAGING — XA IR ANGIO/ADD [PERSON_NAME]
11 of 24 series · 11 of 24 positions shown · IV contrast (IODINE)
Comparison: none

INDICATION: 58-year-old female with active COVID pneumonia and multifocal
intra-abdominal hemorrhage is of uncertain etiology. CTA
demonstrates some irregularity of the arteries concerning for COVID
related vasculitis. She presents for urgent angiogram and attempted
embolization.

[Series 2: body 4 care · 21 acquisitions, 1 frame shown (1 of 11)]
[im 1/21]
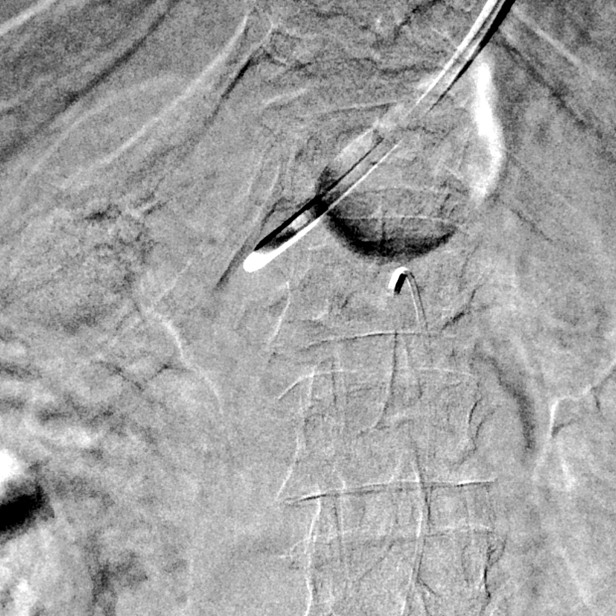

[Series 4: body 4 care · 8 acquisitions, 1 frame shown (2 of 11)]
[im 1/8]
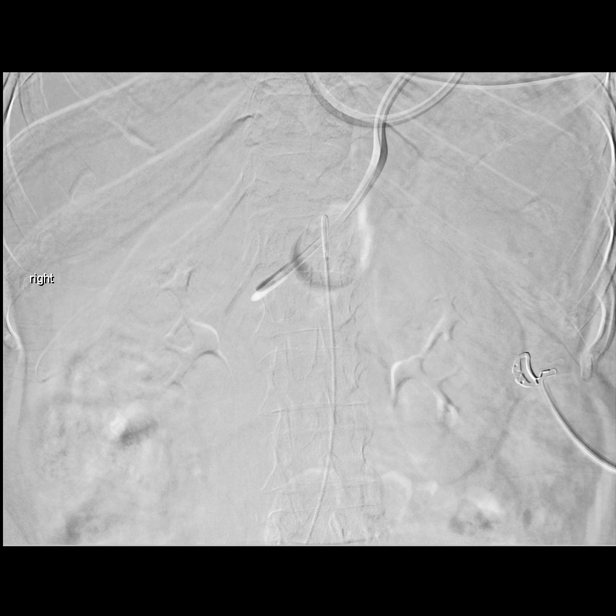

[Series 6: body 4 care · 2 acquisitions, 1 frame shown (3 of 11)]
[im 1/2]
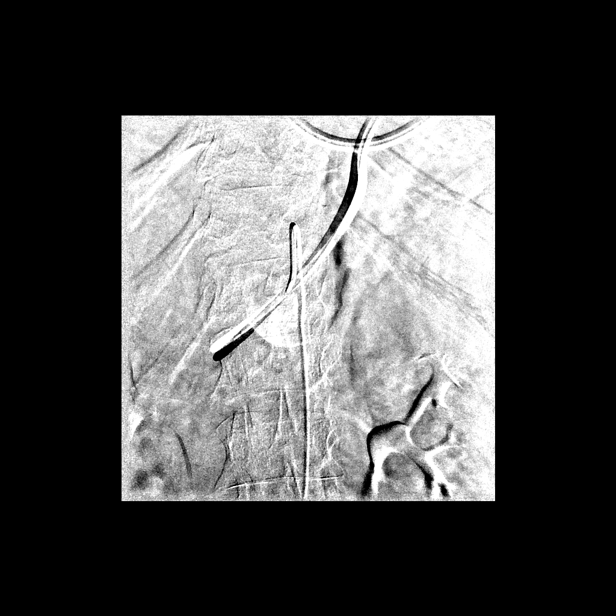

[Series 8: body 4 care · 13 acquisitions, 1 frame shown (4 of 11)]
[im 1/13]
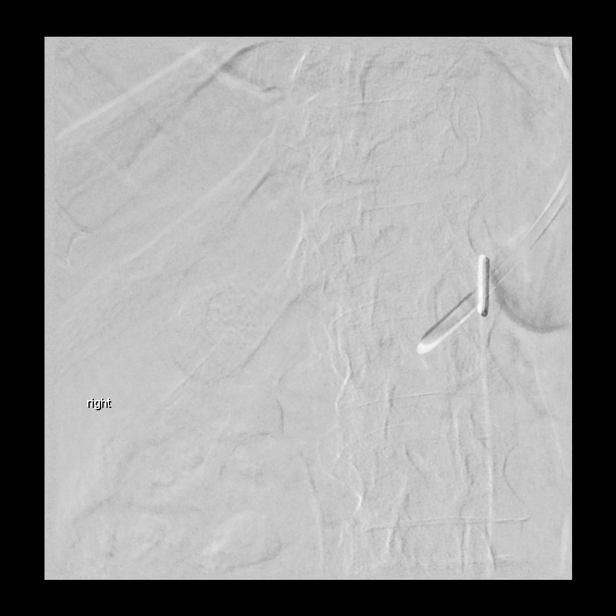

[Series 10: body 4 care · 5 acquisitions, 1 frame shown (5 of 11)]
[im 1/5]
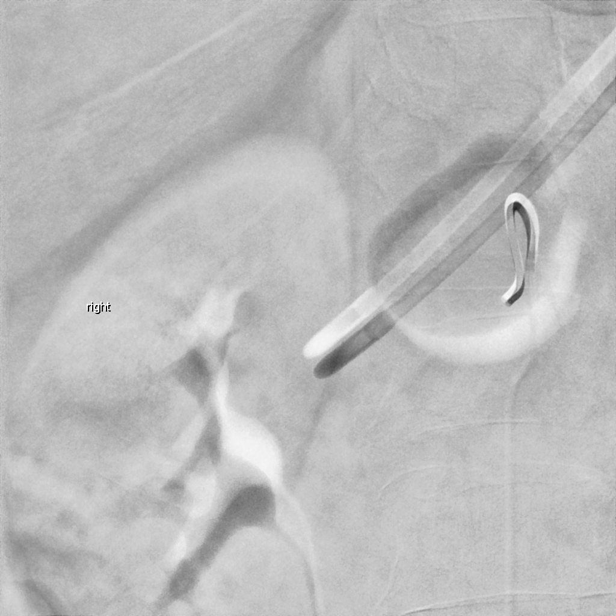

[Series 13: body 4 care · 1 of 28 frames shown (6 of 11)]
[frame 15/28]
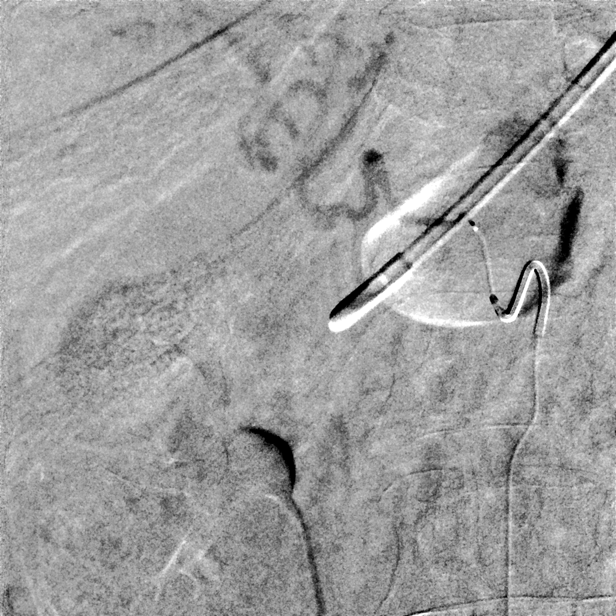

[Series 15: body 4 care · 18 acquisitions, 1 frame shown (7 of 11)]
[im 1/18]
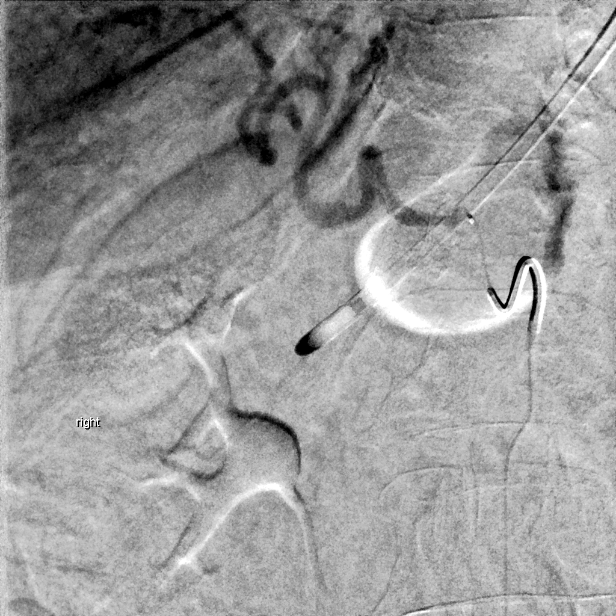

[Series 17: body 4 care · 12 acquisitions, 1 frame shown (8 of 11)]
[im 1/12]
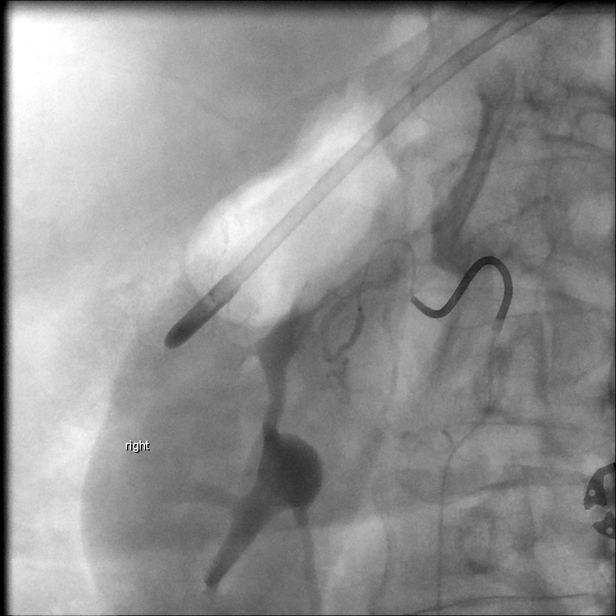

[Series 19: body 4 care · 6 acquisitions, 1 frame shown (9 of 11)]
[im 1/6]
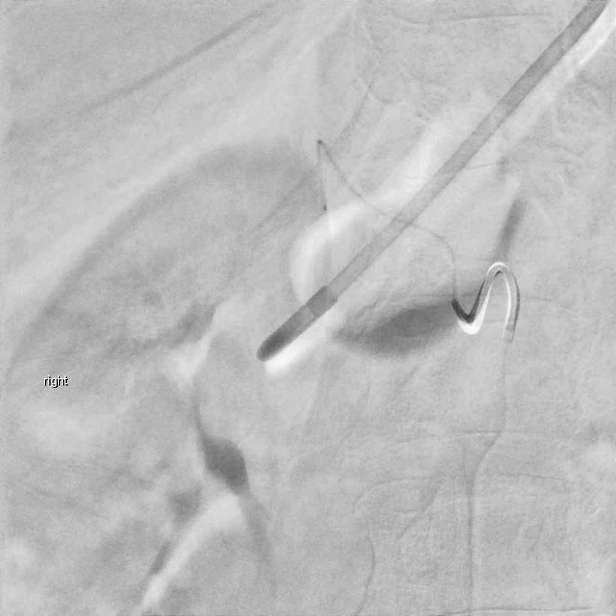

[Series 21: body 4 care · 13 acquisitions, 1 frame shown (10 of 11)]
[im 1/13]
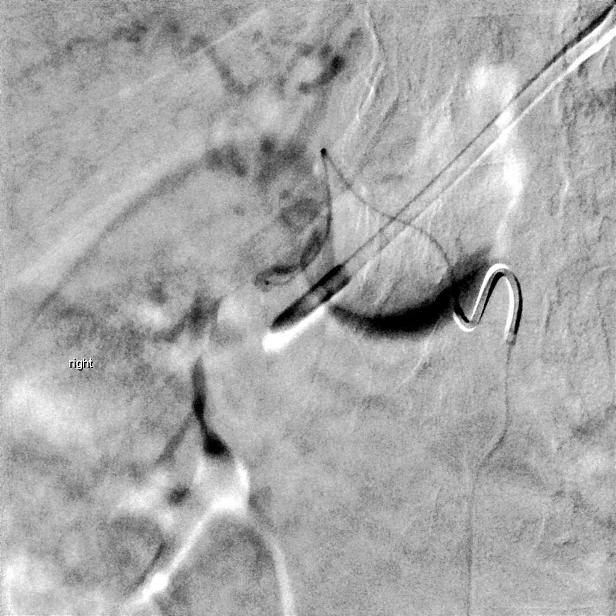

[Series 23: body 4 care · 40 acquisitions, 1 frame shown (11 of 11)]
[im 1/40]
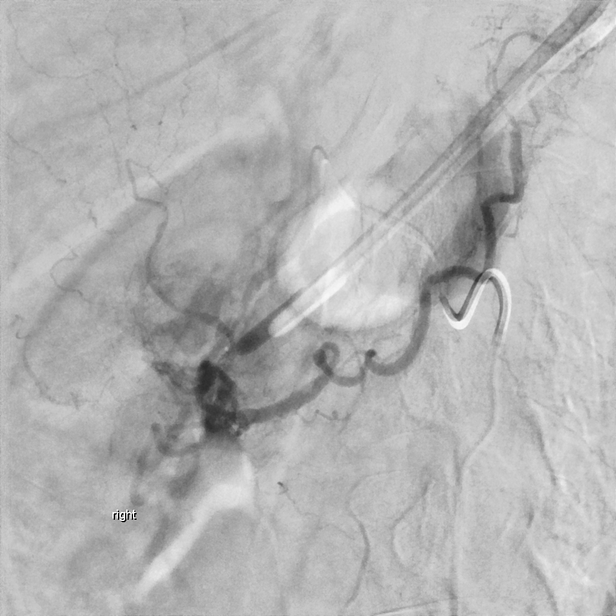

[11 of 24 positions shown; findings below may reference images not displayed]

EXAM:
SELECTIVE VISCERAL ARTERIOGRAPHY; ADDITIONAL ARTERIOGRAPHY; IR
ULTRASOUND GUIDANCE VASC ACCESS RIGHT

1. Ultrasound-guided arterial access
2. Catheterization of the superior mesenteric artery with
arteriogram
3. Catheterization of the celiac artery with arteriogram
4. Catheterization of the dorsal pancreatic artery with arteriogram
5. Catheterization of the common hepatic artery with arteriogram
6. Catheterization of the gastroduodenal artery with arteriogram
7. Catheterization of the right gastroepiploic artery with
arteriogram

MEDICATIONS:
None

ANESTHESIA/SEDATION:
Moderate (conscious) sedation was employed during this procedure. A
total of Versed 2 mg and Fentanyl 100 mcg was administered
intravenously.

Moderate Sedation Time: 69 minutes. The patient's level of
consciousness and vital signs were monitored continuously by
radiology nursing throughout the procedure under my direct
supervision.

CONTRAST:  75mL OMNIPAQUE IOHEXOL 300 MG/ML SOLN, 75mL OMNIPAQUE
IOHEXOL 300 MG/ML SOLN, 50mL OMNIPAQUE IOHEXOL 300 MG/ML SOLN

FLUOROSCOPY TIME:  Fluoroscopy Time: 24 minutes 18 seconds (3,084
mGy).

COMPLICATIONS:
None immediate.



The right common femoral artery was interrogated with ultrasound and
found to be widely patent. An image was obtained and stored for the
medical record. Local anesthesia was attained by infiltration with
1% lidocaine. A small dermatotomy was made. Under real-time
sonographic guidance, the vessel was punctured with a 21 gauge
micropuncture needle. Using standard technique, the initial micro
needle was exchanged over a 0.018 micro wire for a transitional 4
French micro sheath. The micro sheath was then exchanged over a
0.035 wire for a 5 French vascular sheath.

A C2 cobra catheter was advanced over a Bentson wire into the
abdominal aorta. The catheter was used to select the superior
mesenteric artery. The findings are highly abnormal. There is
significant hypertrophy and tortuosity of the pancreaticoduodenal
arcade. Multiple of the vessels demonstrate an irregular beaded
appearance with areas of focal narrowing and fusiform dilation. Flow
passes directly through the pancreaticoduodenal arcade into the
gastroduodenal artery and then into the gastric and hepatic vascular
beds. This suggests flow limiting stenosis or occlusion at the
origin of the celiac artery. The middle colic artery is highly
abnormal. The artery is nearly occluded proximally and spasmed to a
thread like caliber followed by focal dilation up to 2 mm.

Extensive attempts were then made to catheterize the celiac artery
first using a C2 cobra catheter then using a Sos Omni selective
catheter. Ultimately, a Mickelson catheter was successfully engaged
into the origin of the celiac artery. The origin is stenotic and
nearly occlusive by the presence of the 5 French catheter.
Arteriography was again performed demonstrating diffusely abnormal
arterial structures. The left gastric artery is stenotic and
irregular proximally before becoming aneurysmally dilated. The
common hepatic and proper hepatic artery is aneurysmal. No antegrade
flow identified into the gastroduodenal or dorsal pancreatic
arteries which were easily visualized on the SMA injection. This
suggests that the direction of flow is from the SMA into the celiac
territory.

An attempt was made to position the Mickelson catheter deeper into
the celiac artery, however the vessels are extremely fragile and
manipulation resulted in a focal non flow limiting dissection. The
catheter was brought back and perched just within the origin. A
renegade STC microcatheter was carefully advanced over a Fathom 16
wire. The microcatheter was advanced into the dorsal pancreatic
artery. Arteriography was performed. Injection into the dorsal
pancreatic artery was challenging given that inflow was coming from
the SMA artery through the pancreaticoduodenal arcade. No evidence
of focal hemorrhage or targetable segment for embolization.

The microcatheter was brought back into the celiac artery and next
advanced into the common hepatic artery. Arteriography was
performed. This demonstrates focal mild fusiform aneurysmal
dilatation of the hepatic artery. The origin of the gastroduodenal
artery could not be visualized secondary to inflow from the
pancreaticoduodenal arcade. Using landmarks, the microcatheter was
successfully advanced into the vessel. Arteriography was performed.
The vessel is mildly fusiform dilated. The microcatheter was further
advanced beyond the gastroduodenal artery and into the next arterial
bed. Arteriography was performed confirming that the catheter is
within the gastroepiploic artery. Again, the artery is diffusely
abnormal with multiple areas of focal narrowing and mild fusiform
dilation. Numerous additional unnamed branch vessels are also
present all of which appear abnormal.

Given that there is no evidence of active extravasation or
hemorrhage from any of the interrogated arteries and that the
underlying arterial problem appears to be a diffuse visceral
vasculitis, there is no target for prophylactic embolization. As
such, no further intervention will be performed.

The catheters were removed. Hemostasis was attained with the
assistance of a Cordis Exoseal extra arterial vascular plug.
IMPRESSION: 1. Diffuse vasculitis involving the small and medium arteries of the
visceral arterial tree. Affected arteries include the middle colic
artery, the entire pancreaticoduodenal arcade, the celiac artery,
the left gastric artery, the hepatic artery, the gastroduodenal
artery, the right gastroepiploic artery and the dorsal pancreatic
artery.
2. There is no evidence of active hemorrhage at this time.
3. Given the diffuse nature of the process, there is no focal area
amenable to a meaningful prophylactic embolization. The entire
visceral arterial tree is currently inflamed, irregular and at risk
for spontaneous hemorrhage.

PLAN:
*Unfortunately, endovascular intervention, and very likely surgical
intervention would not be of meaningful benefit.
*Recommend continuing to trend H and H and transfuse as needed in
addition to supportive care.

## 2019-03-29 IMAGING — DX DG CHEST 1V PORT
1 series · 1 of 1 positions shown · non-contrast
Comparison: [DATE].

CLINICAL DATA: Central line placement.

EXAM:
PORTABLE CHEST 1 VIEW

[chest ap]
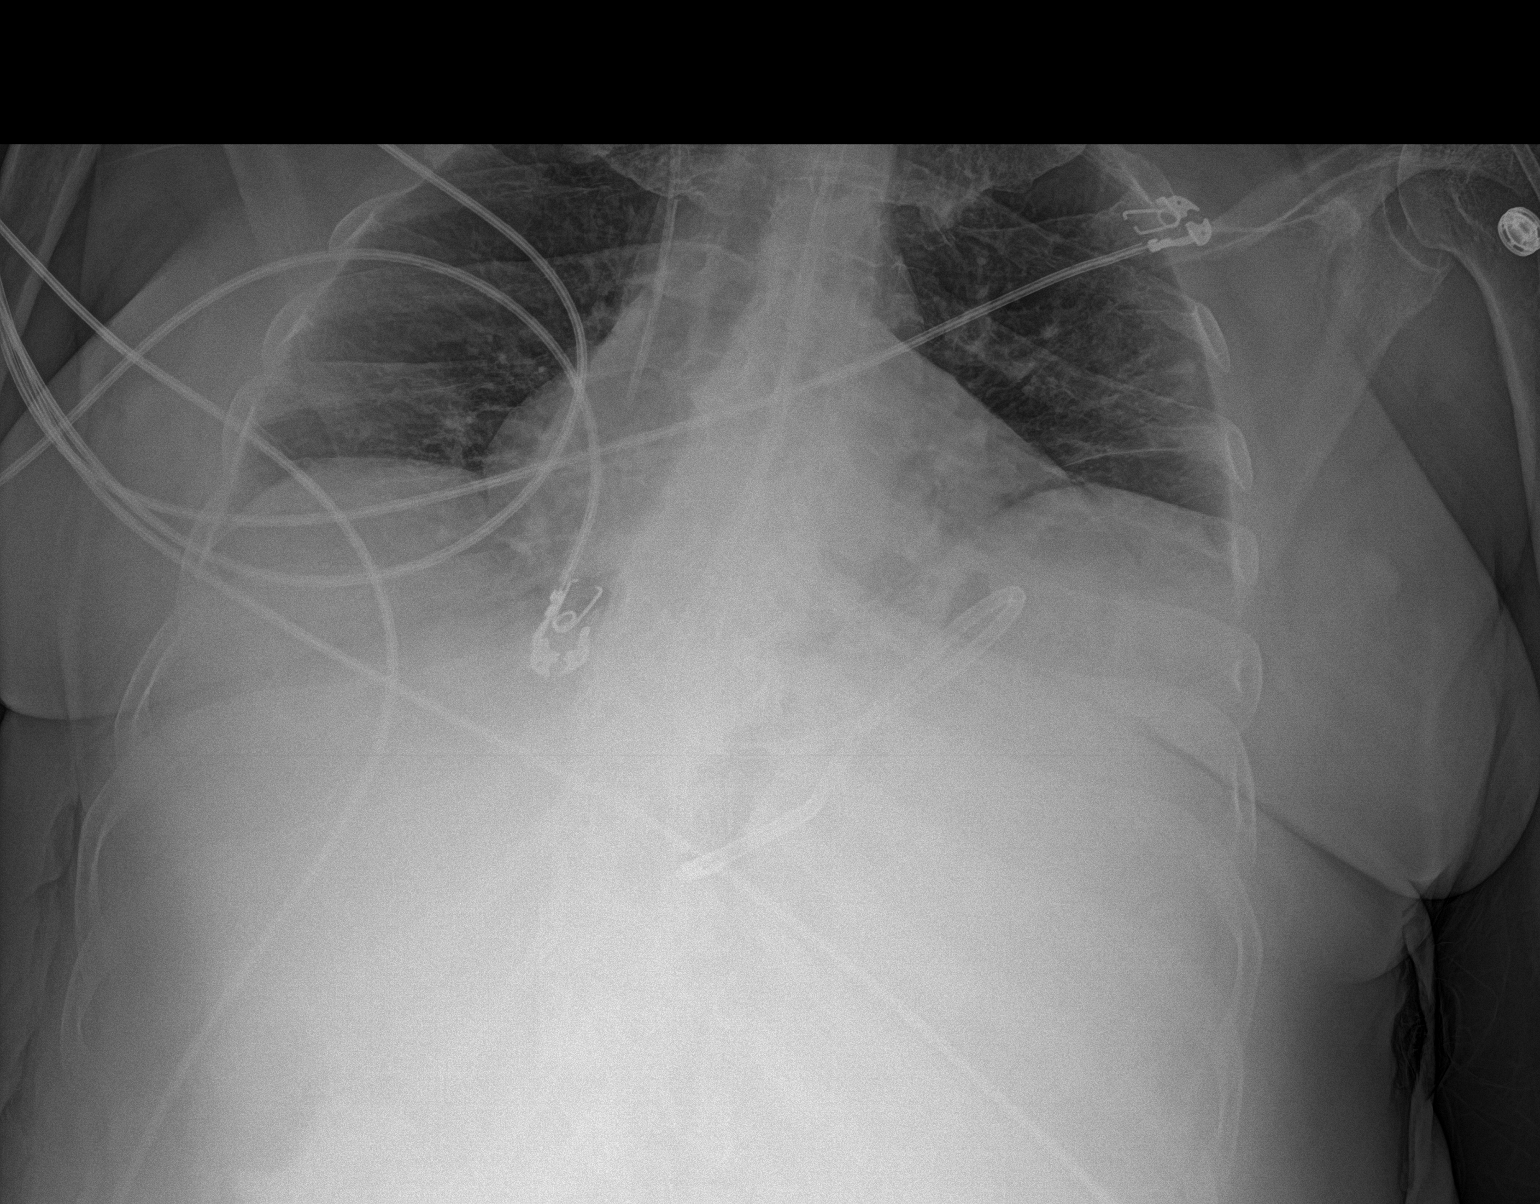

[1 of 1 positions shown; findings below may reference images not displayed]

FINDINGS: Stable cardiomediastinal silhouette. Feeding tube is seen with
distal tip in expected position of stomach. Right internal jugular
catheter is noted with distal tip in expected position of the SVC.
Hypoinflation of the lungs is noted. No pneumothorax or pleural
effusion is noted. Lungs are clear. Bony thorax is unremarkable.
IMPRESSION: Right internal jugular catheter is noted with tip in expected
position of the SVC. Distal tip of feeding tube is seen within the
stomach. Hypoinflation of the lungs is noted.

## 2019-03-29 IMAGING — XA IR ANGIO/ADD [PERSON_NAME]
11 of 24 series · 11 of 24 positions shown · IV contrast (IODINE)
Comparison: none

INDICATION: 58-year-old female with active COVID pneumonia and multifocal
intra-abdominal hemorrhage is of uncertain etiology. CTA
demonstrates some irregularity of the arteries concerning for COVID
related vasculitis. She presents for urgent angiogram and attempted
embolization.

[Series 2: body 4 care · 21 acquisitions, 1 frame shown (1 of 11)]
[im 1/21]
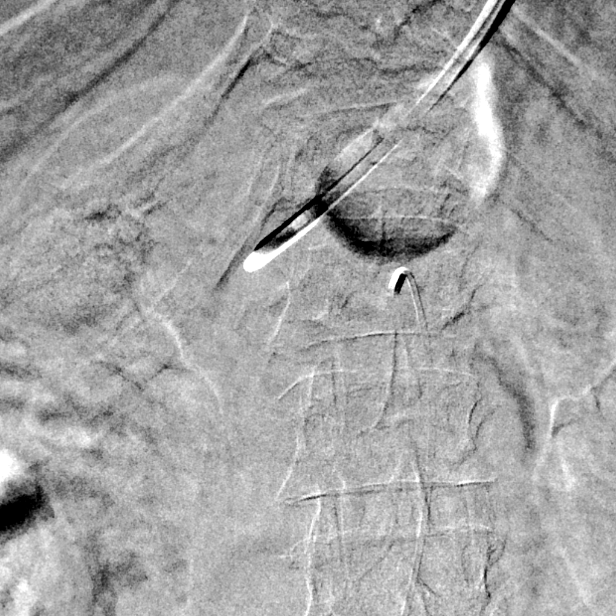

[Series 4: body 4 care · 8 acquisitions, 1 frame shown (2 of 11)]
[im 1/8]
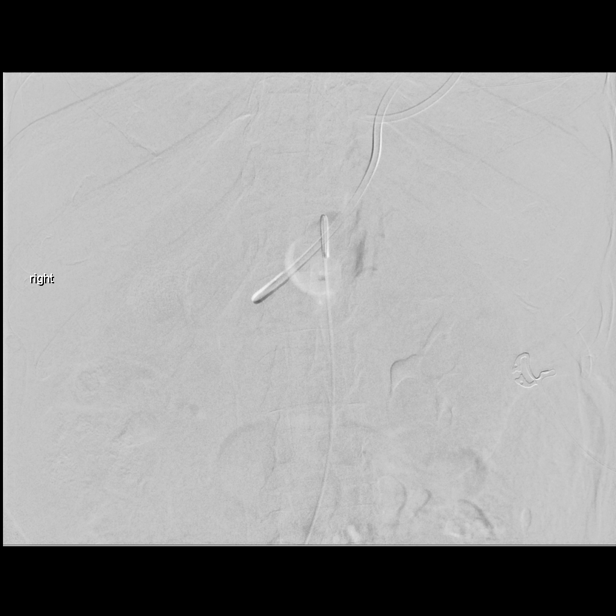

[Series 6: body 4 care · 2 acquisitions, 1 frame shown (3 of 11)]
[im 1/2]
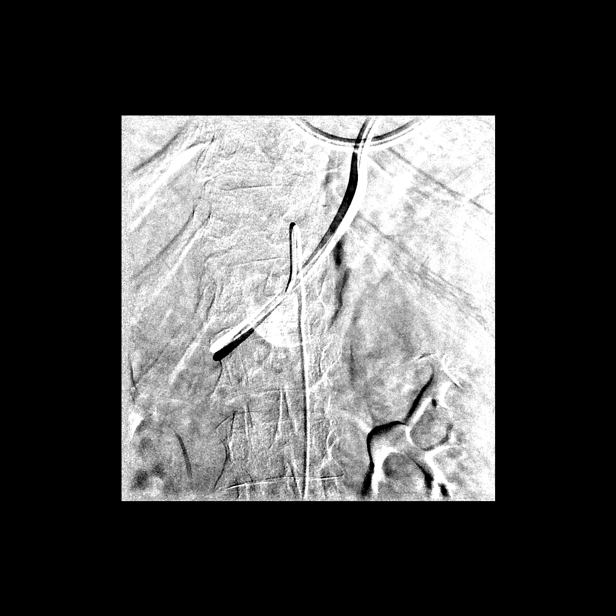

[Series 8: body 4 care · 13 acquisitions, 1 frame shown (4 of 11)]
[im 1/13]
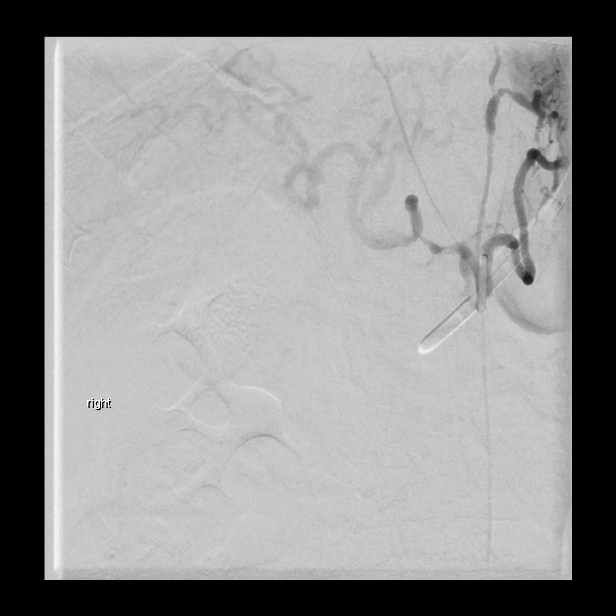

[Series 10: body 4 care · 5 acquisitions, 1 frame shown (5 of 11)]
[im 1/5]
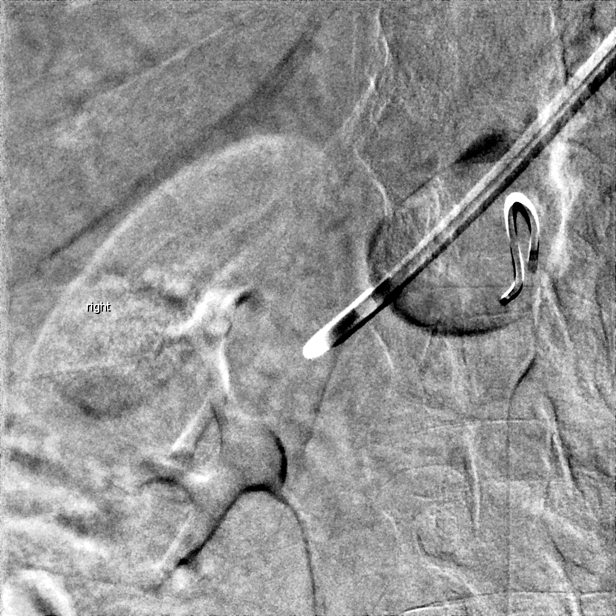

[Series 13: body 4 care · 1 of 28 frames shown (6 of 11)]
[frame 15/28]
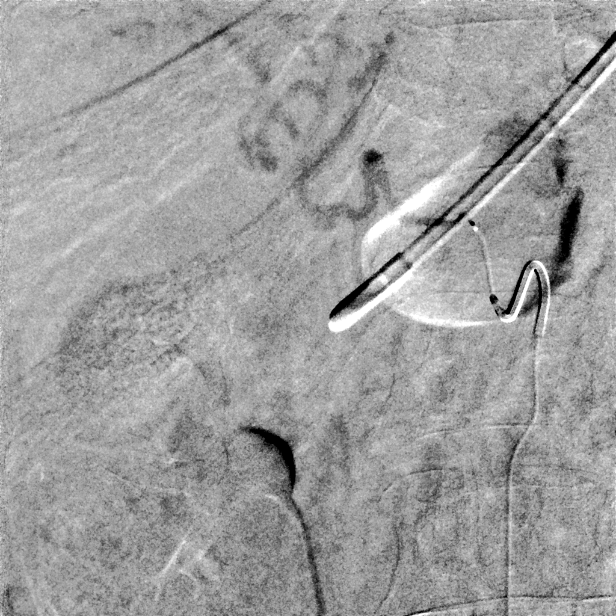

[Series 15: body 4 care · 18 acquisitions, 1 frame shown (7 of 11)]
[im 1/18]
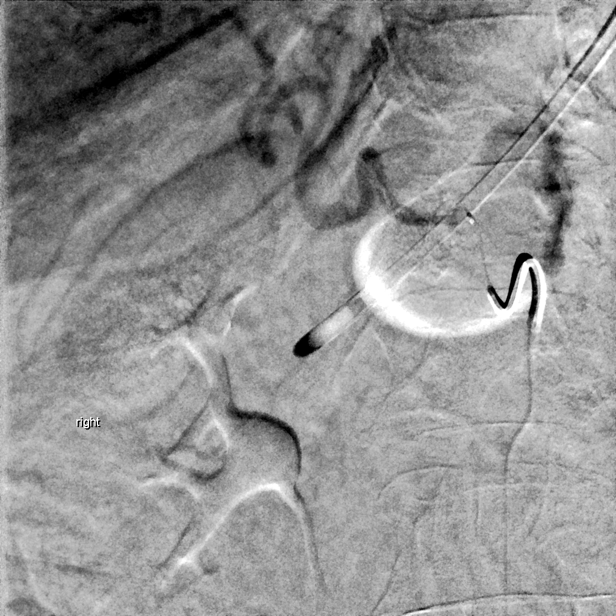

[Series 17: body 4 care · 12 acquisitions, 1 frame shown (8 of 11)]
[im 1/12]
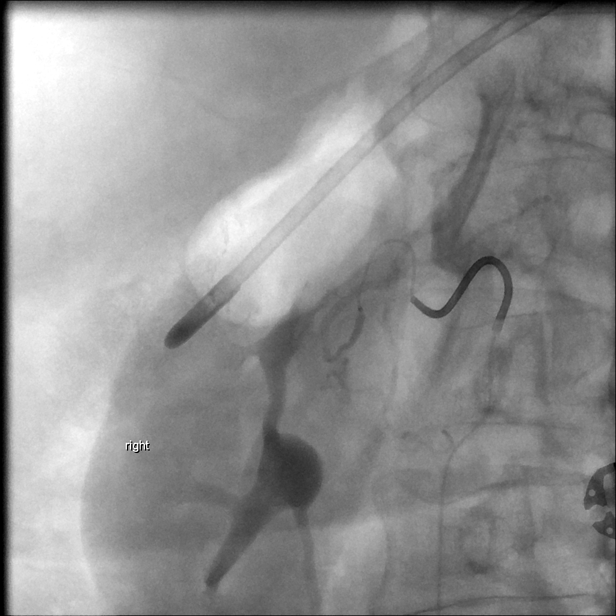

[Series 19: body 4 care · 6 acquisitions, 1 frame shown (9 of 11)]
[im 1/6]
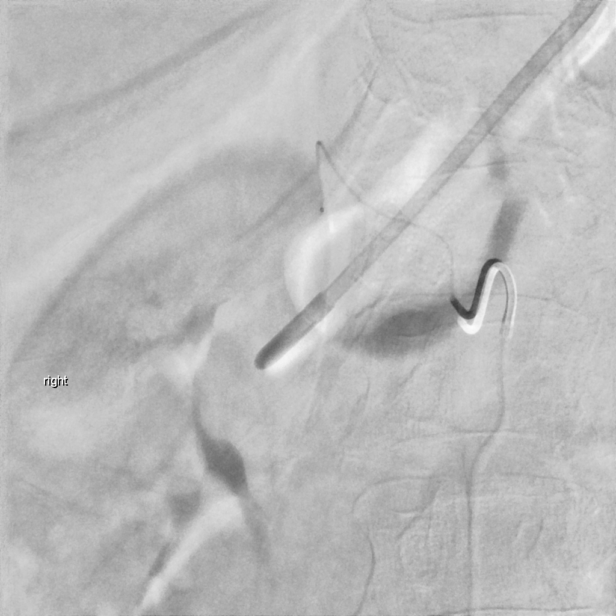

[Series 21: body 4 care · 13 acquisitions, 1 frame shown (10 of 11)]
[im 1/13  full-range]
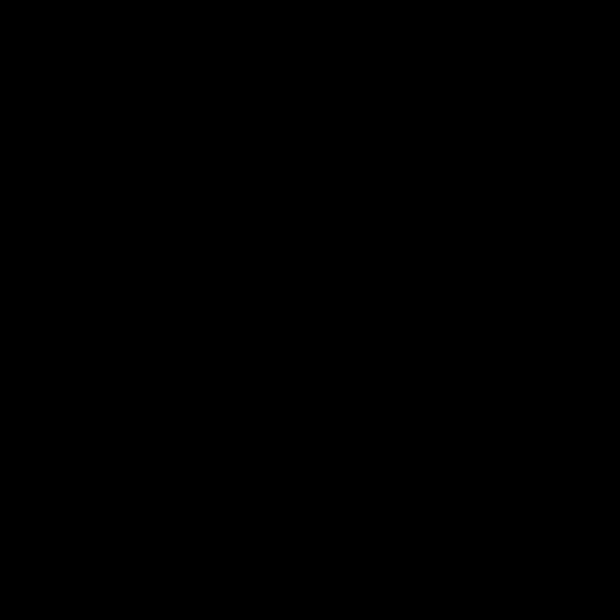

[Series 23: body 4 care · 40 acquisitions, 1 frame shown (11 of 11)]
[im 1/40  full-range]
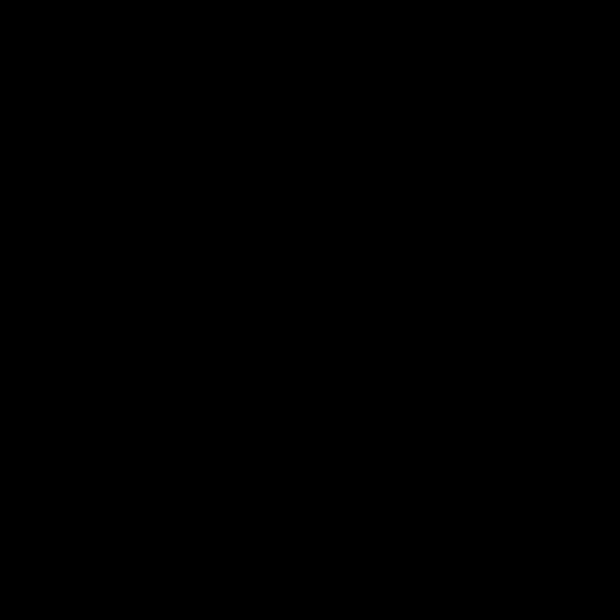

[11 of 24 positions shown; findings below may reference images not displayed]

EXAM:
SELECTIVE VISCERAL ARTERIOGRAPHY; ADDITIONAL ARTERIOGRAPHY; IR
ULTRASOUND GUIDANCE VASC ACCESS RIGHT

1. Ultrasound-guided arterial access
2. Catheterization of the superior mesenteric artery with
arteriogram
3. Catheterization of the celiac artery with arteriogram
4. Catheterization of the dorsal pancreatic artery with arteriogram
5. Catheterization of the common hepatic artery with arteriogram
6. Catheterization of the gastroduodenal artery with arteriogram
7. Catheterization of the right gastroepiploic artery with
arteriogram

MEDICATIONS:
None

ANESTHESIA/SEDATION:
Moderate (conscious) sedation was employed during this procedure. A
total of Versed 2 mg and Fentanyl 100 mcg was administered
intravenously.

Moderate Sedation Time: 69 minutes. The patient's level of
consciousness and vital signs were monitored continuously by
radiology nursing throughout the procedure under my direct
supervision.

CONTRAST:  75mL OMNIPAQUE IOHEXOL 300 MG/ML SOLN, 75mL OMNIPAQUE
IOHEXOL 300 MG/ML SOLN, 50mL OMNIPAQUE IOHEXOL 300 MG/ML SOLN

FLUOROSCOPY TIME:  Fluoroscopy Time: 24 minutes 18 seconds (3,084
mGy).

COMPLICATIONS:
None immediate.



The right common femoral artery was interrogated with ultrasound and
found to be widely patent. An image was obtained and stored for the
medical record. Local anesthesia was attained by infiltration with
1% lidocaine. A small dermatotomy was made. Under real-time
sonographic guidance, the vessel was punctured with a 21 gauge
micropuncture needle. Using standard technique, the initial micro
needle was exchanged over a 0.018 micro wire for a transitional 4
French micro sheath. The micro sheath was then exchanged over a
0.035 wire for a 5 French vascular sheath.

A C2 cobra catheter was advanced over a Bentson wire into the
abdominal aorta. The catheter was used to select the superior
mesenteric artery. The findings are highly abnormal. There is
significant hypertrophy and tortuosity of the pancreaticoduodenal
arcade. Multiple of the vessels demonstrate an irregular beaded
appearance with areas of focal narrowing and fusiform dilation. Flow
passes directly through the pancreaticoduodenal arcade into the
gastroduodenal artery and then into the gastric and hepatic vascular
beds. This suggests flow limiting stenosis or occlusion at the
origin of the celiac artery. The middle colic artery is highly
abnormal. The artery is nearly occluded proximally and spasmed to a
thread like caliber followed by focal dilation up to 2 mm.

Extensive attempts were then made to catheterize the celiac artery
first using a C2 cobra catheter then using a Sos Omni selective
catheter. Ultimately, a Mickelson catheter was successfully engaged
into the origin of the celiac artery. The origin is stenotic and
nearly occlusive by the presence of the 5 French catheter.
Arteriography was again performed demonstrating diffusely abnormal
arterial structures. The left gastric artery is stenotic and
irregular proximally before becoming aneurysmally dilated. The
common hepatic and proper hepatic artery is aneurysmal. No antegrade
flow identified into the gastroduodenal or dorsal pancreatic
arteries which were easily visualized on the SMA injection. This
suggests that the direction of flow is from the SMA into the celiac
territory.

An attempt was made to position the Mickelson catheter deeper into
the celiac artery, however the vessels are extremely fragile and
manipulation resulted in a focal non flow limiting dissection. The
catheter was brought back and perched just within the origin. A
renegade STC microcatheter was carefully advanced over a Fathom 16
wire. The microcatheter was advanced into the dorsal pancreatic
artery. Arteriography was performed. Injection into the dorsal
pancreatic artery was challenging given that inflow was coming from
the SMA artery through the pancreaticoduodenal arcade. No evidence
of focal hemorrhage or targetable segment for embolization.

The microcatheter was brought back into the celiac artery and next
advanced into the common hepatic artery. Arteriography was
performed. This demonstrates focal mild fusiform aneurysmal
dilatation of the hepatic artery. The origin of the gastroduodenal
artery could not be visualized secondary to inflow from the
pancreaticoduodenal arcade. Using landmarks, the microcatheter was
successfully advanced into the vessel. Arteriography was performed.
The vessel is mildly fusiform dilated. The microcatheter was further
advanced beyond the gastroduodenal artery and into the next arterial
bed. Arteriography was performed confirming that the catheter is
within the gastroepiploic artery. Again, the artery is diffusely
abnormal with multiple areas of focal narrowing and mild fusiform
dilation. Numerous additional unnamed branch vessels are also
present all of which appear abnormal.

Given that there is no evidence of active extravasation or
hemorrhage from any of the interrogated arteries and that the
underlying arterial problem appears to be a diffuse visceral
vasculitis, there is no target for prophylactic embolization. As
such, no further intervention will be performed.

The catheters were removed. Hemostasis was attained with the
assistance of a Cordis Exoseal extra arterial vascular plug.
IMPRESSION: 1. Diffuse vasculitis involving the small and medium arteries of the
visceral arterial tree. Affected arteries include the middle colic
artery, the entire pancreaticoduodenal arcade, the celiac artery,
the left gastric artery, the hepatic artery, the gastroduodenal
artery, the right gastroepiploic artery and the dorsal pancreatic
artery.
2. There is no evidence of active hemorrhage at this time.
3. Given the diffuse nature of the process, there is no focal area
amenable to a meaningful prophylactic embolization. The entire
visceral arterial tree is currently inflamed, irregular and at risk
for spontaneous hemorrhage.

PLAN:
*Unfortunately, endovascular intervention, and very likely surgical
intervention would not be of meaningful benefit.
*Recommend continuing to trend H and H and transfuse as needed in
addition to supportive care.

## 2019-03-29 IMAGING — XA IR ANGIO/VISCERAL SELECTIVE EA VESSEL WO/W FLUSH
1 series · 11 of 12 positions shown · IV contrast (IODINE)
Comparison: none

INDICATION: 58-year-old female with active COVID pneumonia and multifocal
intra-abdominal hemorrhage is of uncertain etiology. CTA
demonstrates some irregularity of the arteries concerning for COVID
related vasculitis. She presents for urgent angiogram and attempted
embolization.

[Series 24: body 4 care · 9 acquisitions, 11 frames shown]
[im 1/9]
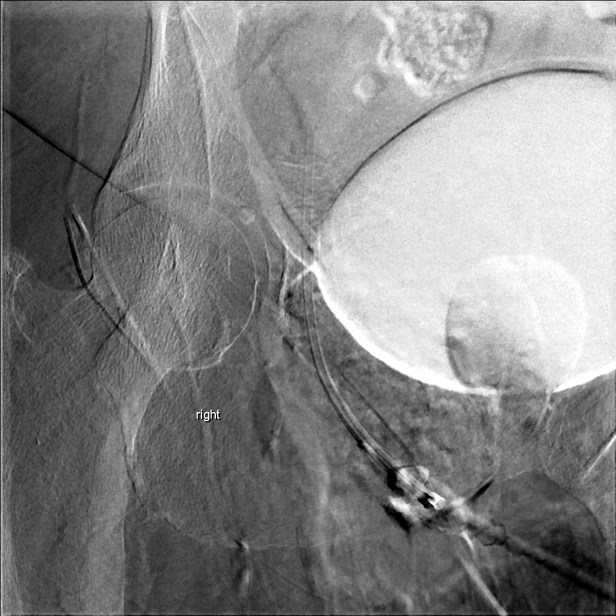
[im 1/9]
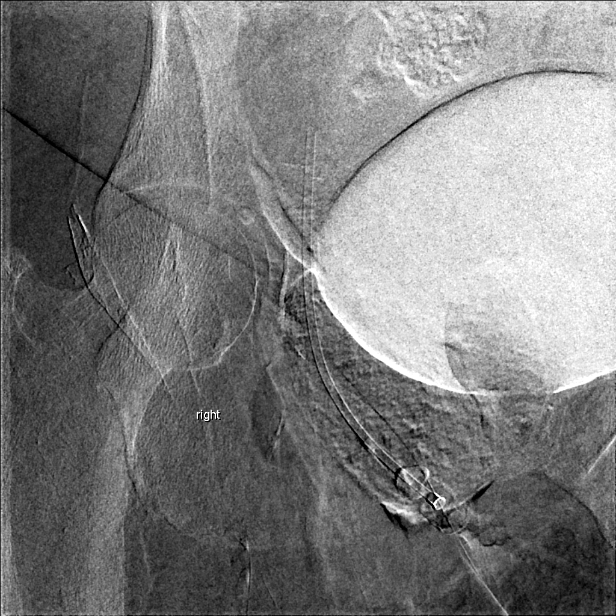
[im 1/9]
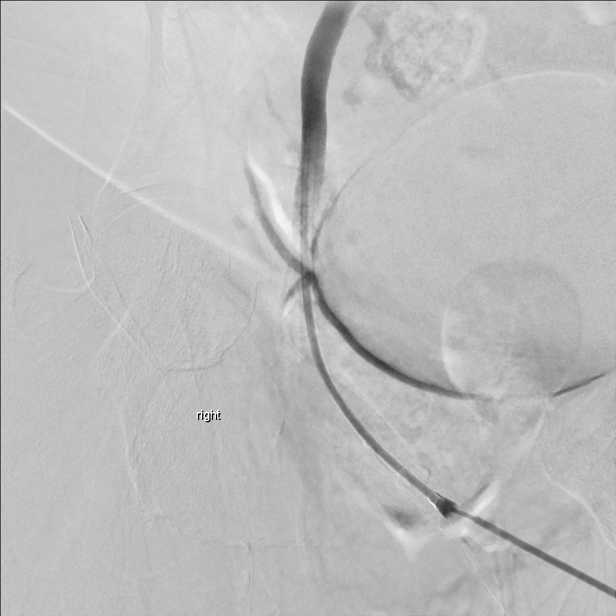
[im 1/9]
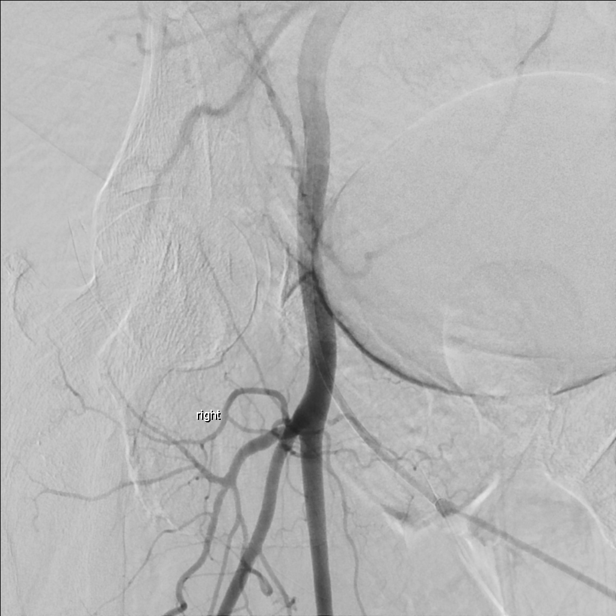
[im 2/9]
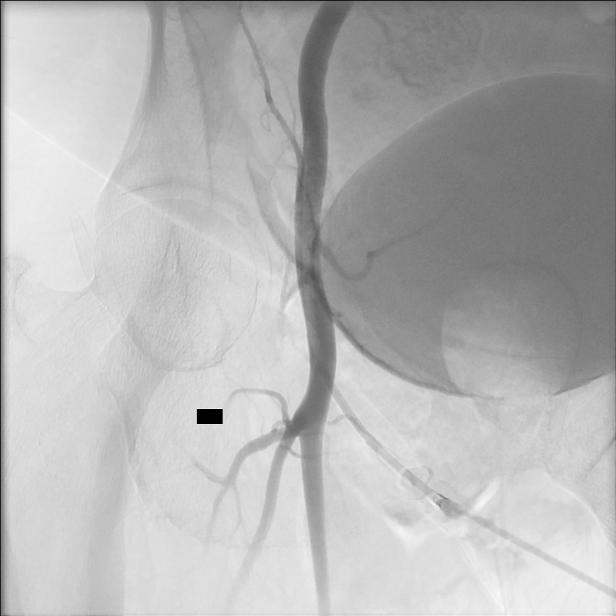
[im 4/9]
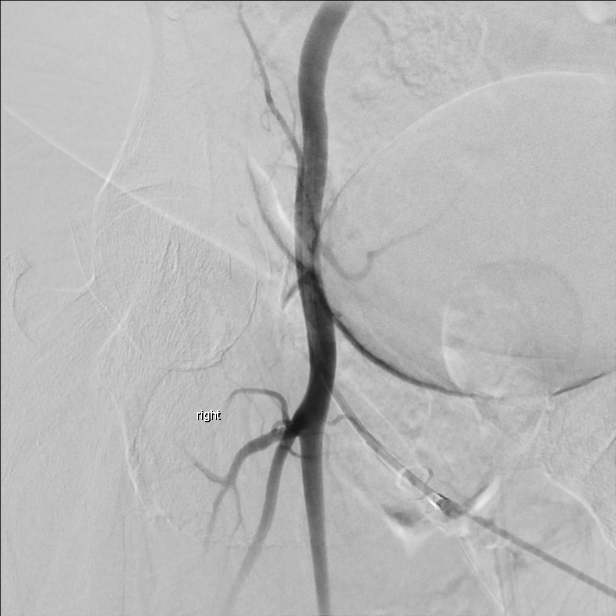
[im 5/9]
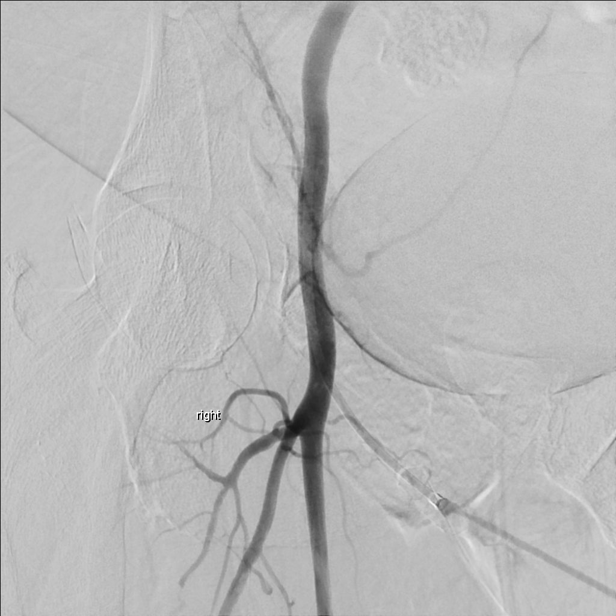
[im 6/9]
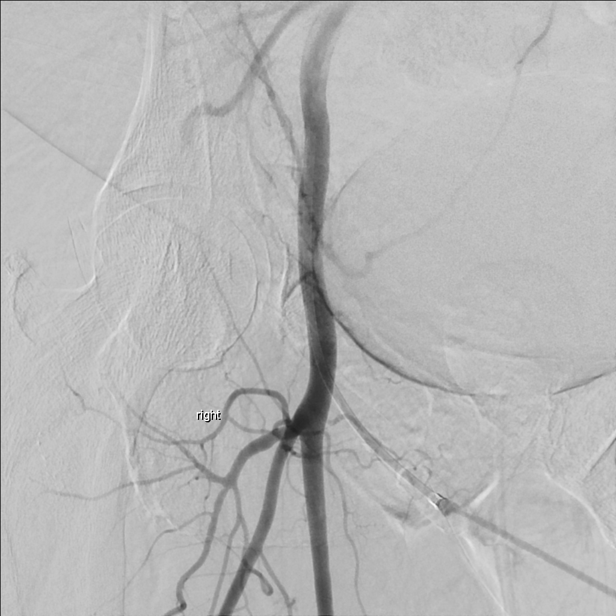
[im 7/9]
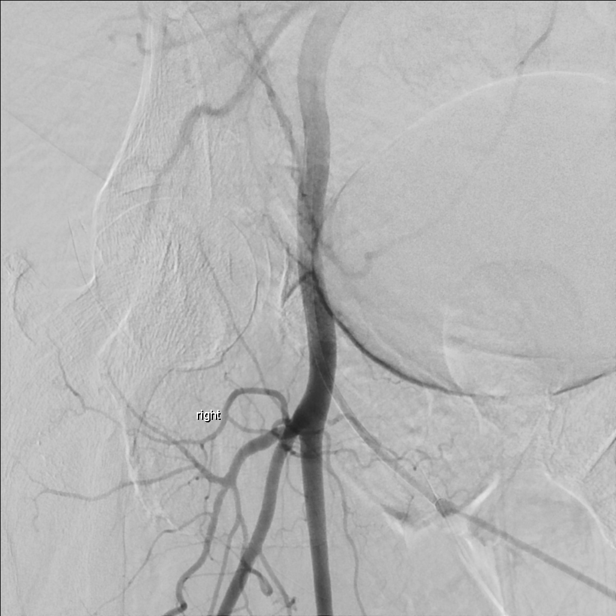
[im 8/9]
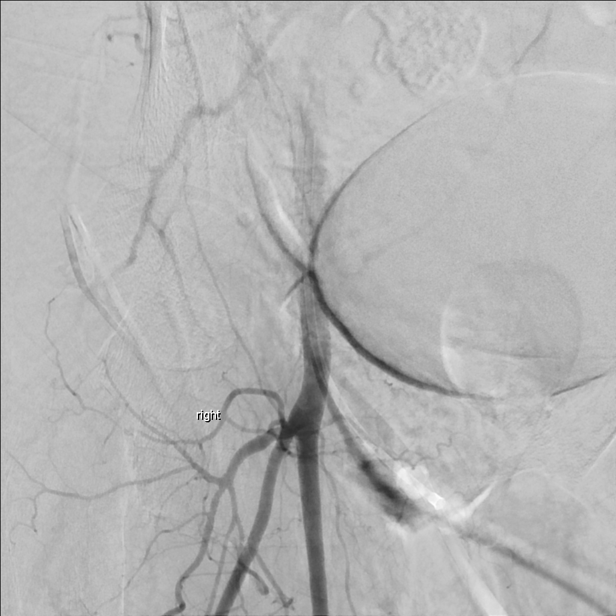
[im 9/9]
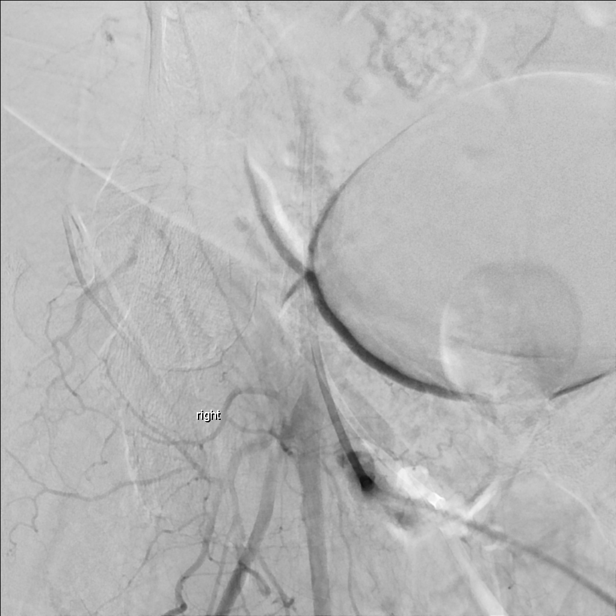

[11 of 12 positions shown; findings below may reference images not displayed]

EXAM:
SELECTIVE VISCERAL ARTERIOGRAPHY; ADDITIONAL ARTERIOGRAPHY; IR
ULTRASOUND GUIDANCE VASC ACCESS RIGHT

1. Ultrasound-guided arterial access
2. Catheterization of the superior mesenteric artery with
arteriogram
3. Catheterization of the celiac artery with arteriogram
4. Catheterization of the dorsal pancreatic artery with arteriogram
5. Catheterization of the common hepatic artery with arteriogram
6. Catheterization of the gastroduodenal artery with arteriogram
7. Catheterization of the right gastroepiploic artery with
arteriogram

MEDICATIONS:
None

ANESTHESIA/SEDATION:
Moderate (conscious) sedation was employed during this procedure. A
total of Versed 2 mg and Fentanyl 100 mcg was administered
intravenously.

Moderate Sedation Time: 69 minutes. The patient's level of
consciousness and vital signs were monitored continuously by
radiology nursing throughout the procedure under my direct
supervision.

CONTRAST:  75mL OMNIPAQUE IOHEXOL 300 MG/ML SOLN, 75mL OMNIPAQUE
IOHEXOL 300 MG/ML SOLN, 50mL OMNIPAQUE IOHEXOL 300 MG/ML SOLN

FLUOROSCOPY TIME:  Fluoroscopy Time: 24 minutes 18 seconds (3,084
mGy).

COMPLICATIONS:
None immediate.



The right common femoral artery was interrogated with ultrasound and
found to be widely patent. An image was obtained and stored for the
medical record. Local anesthesia was attained by infiltration with
1% lidocaine. A small dermatotomy was made. Under real-time
sonographic guidance, the vessel was punctured with a 21 gauge
micropuncture needle. Using standard technique, the initial micro
needle was exchanged over a 0.018 micro wire for a transitional 4
French micro sheath. The micro sheath was then exchanged over a
0.035 wire for a 5 French vascular sheath.

A C2 cobra catheter was advanced over a Bentson wire into the
abdominal aorta. The catheter was used to select the superior
mesenteric artery. The findings are highly abnormal. There is
significant hypertrophy and tortuosity of the pancreaticoduodenal
arcade. Multiple of the vessels demonstrate an irregular beaded
appearance with areas of focal narrowing and fusiform dilation. Flow
passes directly through the pancreaticoduodenal arcade into the
gastroduodenal artery and then into the gastric and hepatic vascular
beds. This suggests flow limiting stenosis or occlusion at the
origin of the celiac artery. The middle colic artery is highly
abnormal. The artery is nearly occluded proximally and spasmed to a
thread like caliber followed by focal dilation up to 2 mm.

Extensive attempts were then made to catheterize the celiac artery
first using a C2 cobra catheter then using a Sos Omni selective
catheter. Ultimately, a Mickelson catheter was successfully engaged
into the origin of the celiac artery. The origin is stenotic and
nearly occlusive by the presence of the 5 French catheter.
Arteriography was again performed demonstrating diffusely abnormal
arterial structures. The left gastric artery is stenotic and
irregular proximally before becoming aneurysmally dilated. The
common hepatic and proper hepatic artery is aneurysmal. No antegrade
flow identified into the gastroduodenal or dorsal pancreatic
arteries which were easily visualized on the SMA injection. This
suggests that the direction of flow is from the SMA into the celiac
territory.

An attempt was made to position the Mickelson catheter deeper into
the celiac artery, however the vessels are extremely fragile and
manipulation resulted in a focal non flow limiting dissection. The
catheter was brought back and perched just within the origin. A
renegade STC microcatheter was carefully advanced over a Fathom 16
wire. The microcatheter was advanced into the dorsal pancreatic
artery. Arteriography was performed. Injection into the dorsal
pancreatic artery was challenging given that inflow was coming from
the SMA artery through the pancreaticoduodenal arcade. No evidence
of focal hemorrhage or targetable segment for embolization.

The microcatheter was brought back into the celiac artery and next
advanced into the common hepatic artery. Arteriography was
performed. This demonstrates focal mild fusiform aneurysmal
dilatation of the hepatic artery. The origin of the gastroduodenal
artery could not be visualized secondary to inflow from the
pancreaticoduodenal arcade. Using landmarks, the microcatheter was
successfully advanced into the vessel. Arteriography was performed.
The vessel is mildly fusiform dilated. The microcatheter was further
advanced beyond the gastroduodenal artery and into the next arterial
bed. Arteriography was performed confirming that the catheter is
within the gastroepiploic artery. Again, the artery is diffusely
abnormal with multiple areas of focal narrowing and mild fusiform
dilation. Numerous additional unnamed branch vessels are also
present all of which appear abnormal.

Given that there is no evidence of active extravasation or
hemorrhage from any of the interrogated arteries and that the
underlying arterial problem appears to be a diffuse visceral
vasculitis, there is no target for prophylactic embolization. As
such, no further intervention will be performed.

The catheters were removed. Hemostasis was attained with the
assistance of a Cordis Exoseal extra arterial vascular plug.
IMPRESSION: 1. Diffuse vasculitis involving the small and medium arteries of the
visceral arterial tree. Affected arteries include the middle colic
artery, the entire pancreaticoduodenal arcade, the celiac artery,
the left gastric artery, the hepatic artery, the gastroduodenal
artery, the right gastroepiploic artery and the dorsal pancreatic
artery.
2. There is no evidence of active hemorrhage at this time.
3. Given the diffuse nature of the process, there is no focal area
amenable to a meaningful prophylactic embolization. The entire
visceral arterial tree is currently inflamed, irregular and at risk
for spontaneous hemorrhage.

PLAN:
*Unfortunately, endovascular intervention, and very likely surgical
intervention would not be of meaningful benefit.
*Recommend continuing to trend H and H and transfuse as needed in
addition to supportive care.

## 2019-03-29 IMAGING — CT CT CTA ABD/PEL W/CM AND/OR W/O CM
1 of 5 series · 12 of 32 positions shown, 16 images · IV contrast (OMNIPAQUE)
Comparison: [DATE]

CLINICAL DATA: 58-year-old female with a history abdominal
hemorrhage

EXAM:
CTA ABDOMEN AND PELVIS WITHOUT AND WITH CONTRAST
TECHNIQUE: Multidetector CT imaging of the abdomen and pelvis was performed
using the standard protocol during bolus administration of
intravenous contrast. Multiplanar reconstructed images and MIPs were
obtained and reviewed to evaluate the vascular anatomy.
CONTRAST:  100mL OMNIPAQUE IOHEXOL 350 MG/ML SOLN

[Series 5: (person_name) thins · axial · 0.86mm/px · z∈[-911,-455]mm · 12 of 744 slices shown, 16 images]
[im 62/744  soft-tissue]
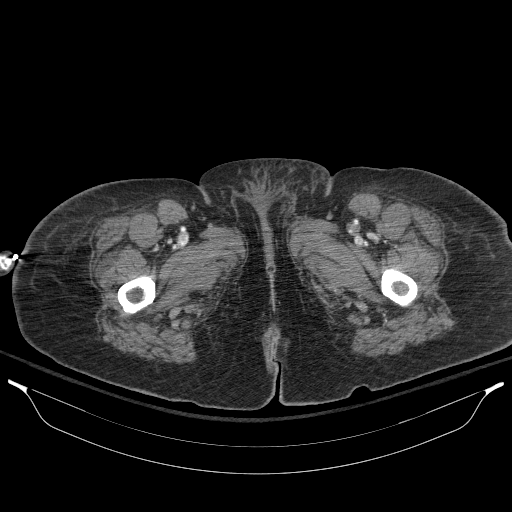
[im 62/744  bone]
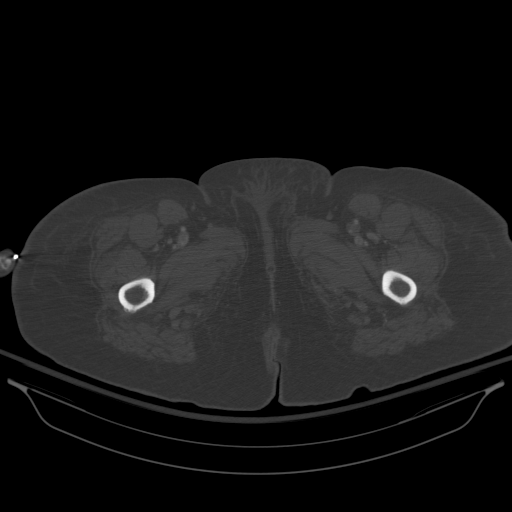
[im 124/744  soft-tissue]
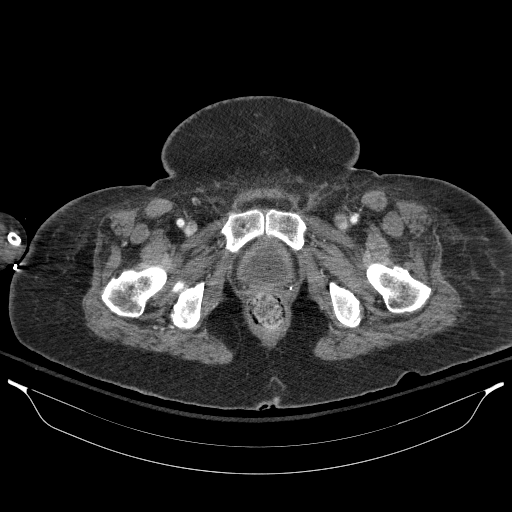
[im 186/744  soft-tissue]
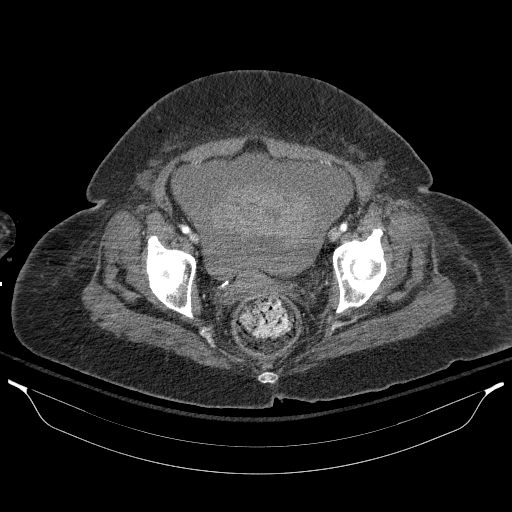
[im 279/744  soft-tissue]
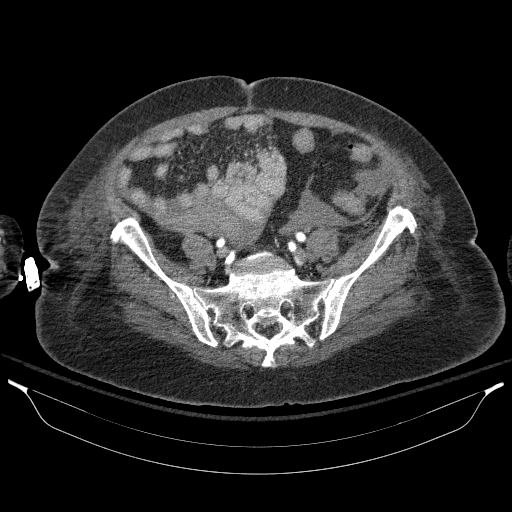
[im 341/744  soft-tissue]
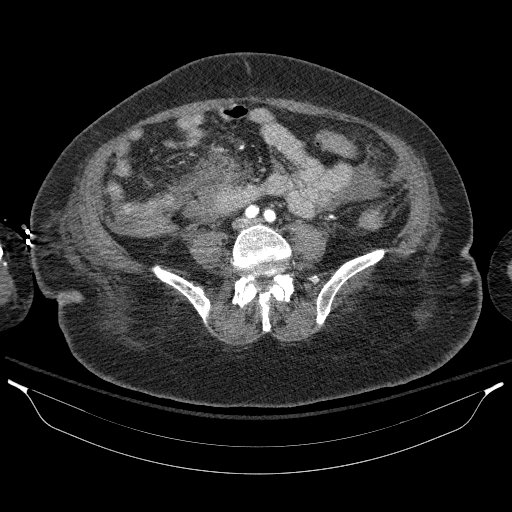
[im 403/744  soft-tissue]
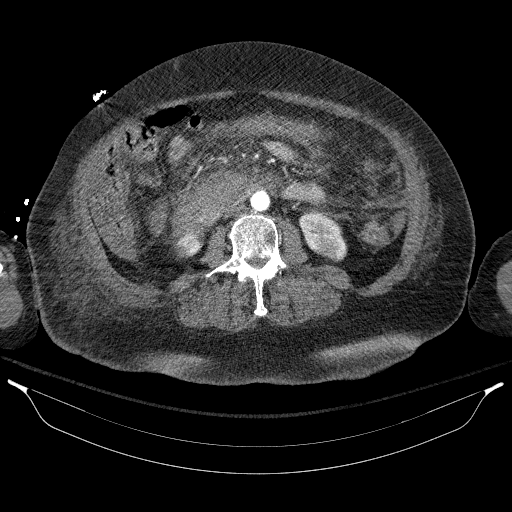
[im 465/744  soft-tissue]
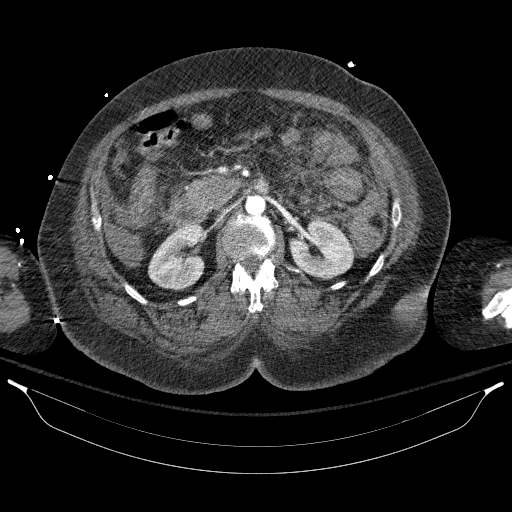
[im 558/744  soft-tissue]
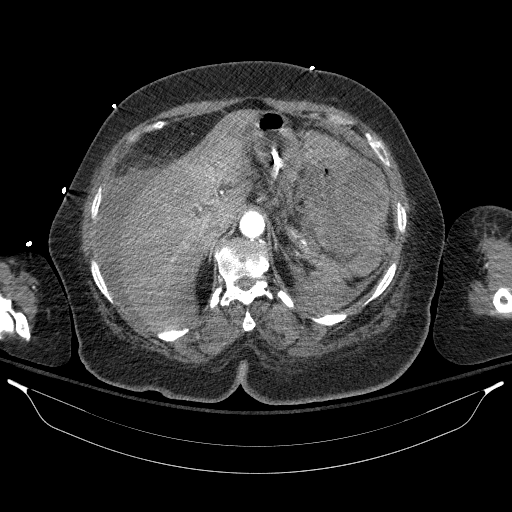
[im 620/744  soft-tissue]
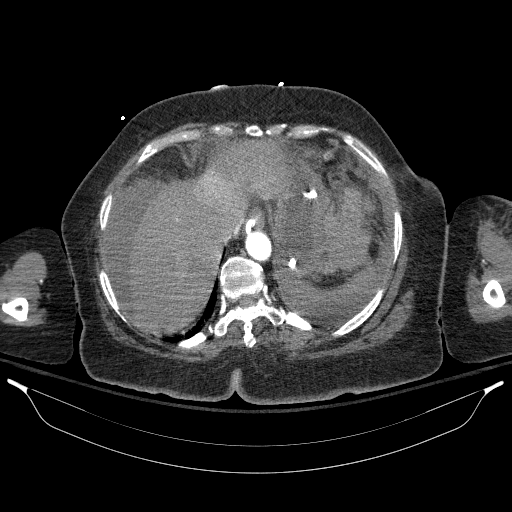
[im 620/744  lung]
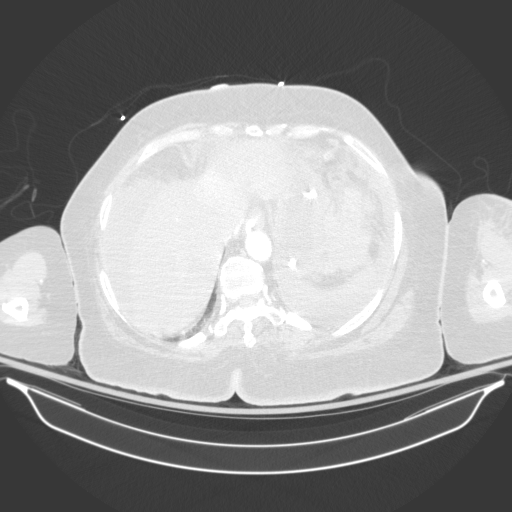
[im 620/744  bone]
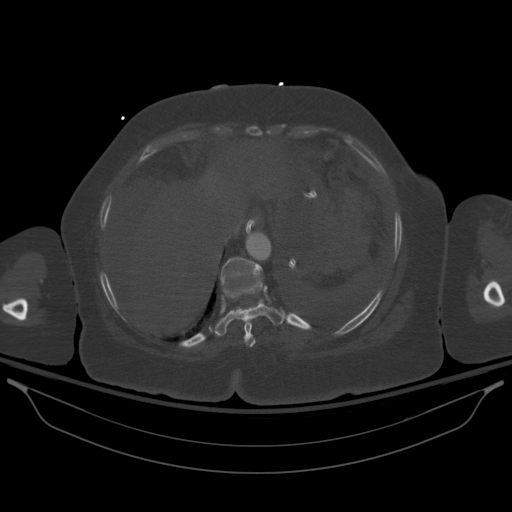
[im 651/744  lung]
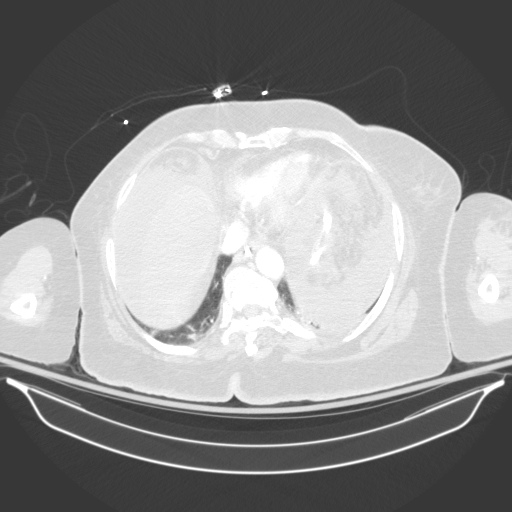
[im 682/744  soft-tissue]
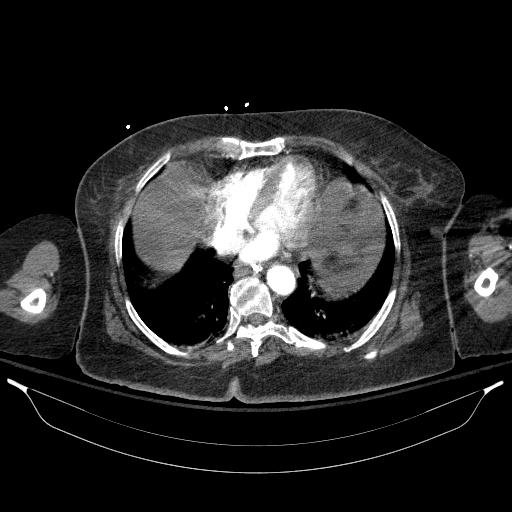
[im 682/744  lung]
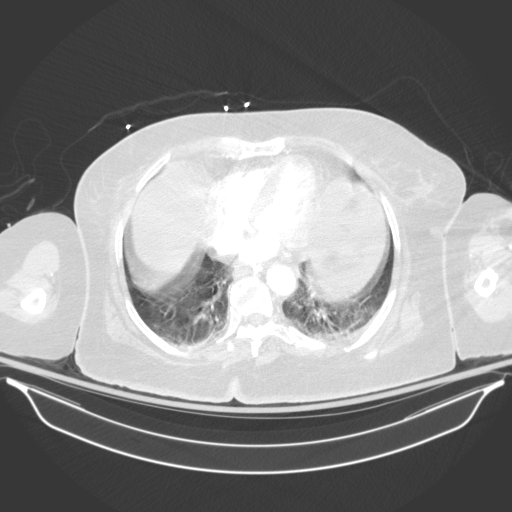
[im 713/744  lung]
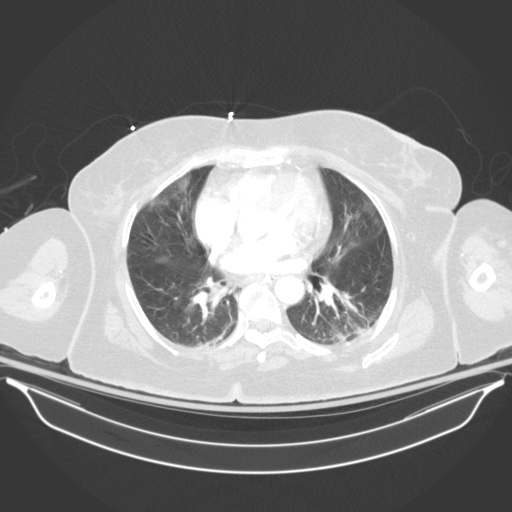

[12 of 32 positions shown; findings below may reference images not displayed]

FINDINGS: VASCULAR

Aorta: Unremarkable course, caliber, contour of the abdominal aorta.
No dissection, aneurysm, or periaortic fluid.

Celiac: No significant atherosclerotic changes at the celiac artery
origin. Small caliber celiac artery. Small caliber left gastric
artery splenic artery hepatic artery.

The arterial phase series 4 demonstrates linear hyperdensity in the
distribution the GDA/pancreaticoduodenal arteries on image 44. This
does persist in the delayed phase images, potentially representing
extravasation. All of the arteries in the distribution of the GDA
are small caliber compatible with vasospasm.

The splenic artery is small caliber compatible with vaso spasm.

SMA: No significant atherosclerotic changes at the SMA origin. SMA
branches are small caliber.

Renals: No significant atherosclerotic changes at the origin renal
arteries which remain patent.

IMA: Inferior mesenteric artery is patent.

Right lower extremity:

Unremarkable course, caliber, and contour of the right iliac system.
No aneurysm, dissection, or occlusion. Hypogastric artery is patent.
Common femoral artery patent. Proximal SFA and profunda femoris
patent.

Left lower extremity:

Unremarkable course, caliber, and contour of the left iliac system.
No aneurysm, dissection, or occlusion. Hypogastric artery is patent.
Common femoral artery patent. Proximal SFA and profunda femoris
patent.

Veins: Unremarkable appearance of the venous system.

Review of the MIP images confirms the above findings.

NON-VASCULAR

Lower chest: Atelectasis at the lung bases.

Hepatobiliary: Unremarkable appearance of the liver. Hyperdense
material layered in the dependent aspect of the gallbladder. No
pericholecystic inflammatory changes.

Pancreas: Pancreatic tail body unremarkable. Evaluation the
pancreatic head somewhat limited by the presence of
hematoma/stranding.

Spleen: The spleen diameter within normal limits, best seen on the
delayed images adjacent to the diaphragm.

Adrenals/Urinary Tract: Unremarkable appearance of the adrenal
glands.

Right:

No hydronephrosis. Symmetric perfusion to the left. No
nephrolithiasis. Unremarkable course of the right ureter. Hypodense
lesion on the lateral cortex of the right kidney incompletely
characterized.

Left:

No hydronephrosis. Symmetric perfusion to the right. No
nephrolithiasis. Unremarkable course of the left ureter. Hypodense
cystic lesions associated with the left kidney at the anterior
cortex and the medial cortex, most likely cysts.

Balloon retention urinary catheter.

Stomach/Bowel: Enteric feeding tube terminates within the stomach.
Small bowel decompressed, with enhancement maintained of the small
bowel wall. Appendix is not visualized, however, no inflammatory
changes are present adjacent to the cecum to indicate an
appendicitis. No significant stool burden. Colonic diverticula. The
splenic flexure is not visualized given the presence of hematoma.
The colon wall at this site is not well evaluated.

Lymphatic: No adenopathy.

Mesenteric: Redemonstration left upper quadrant hemorrhage, centered
in the mesenteric fat, inseparable from the splenic flexure. The
greatest component of the hemorrhage measures 9 point 4 cm x 5.8 cm,
relatively similar in configuration to the comparison. Hemorrhage
extends inferiorly into the fat of the left-sided mesentery.

Redemonstration of pelvic hematoma. Relatively hyperdense
tissue/hematoma overlying the uterus. Trace volume of free fluid
within the small bowel loops. Free fluid within the right upper
quadrant adjacent to the liver, measuring 25 Hounsfield units.

Redemonstration of intermediate density tissue/fluid at the
pancreatic head, inseparable from the first portion the duodenum.
Hyperdense linear streak at the inferior aspect, image 44 of series
4, not present on the delayed images.

Reproductive: Fibroid uterus.

Other: No hernia.

Musculoskeletal: Degenerative changes of the spine. No acute
displaced fracture
IMPRESSION: Multifocal mesenteric hemorrhage again demonstrated, with free
intraperitoneal hemorrhage, including the mesenteric fat of the left
upper quadrant adjacent to the splenic flexure, as well hemorrhage
centered at the pancreatic head.

Questionable extravasation of contrast in the region the pancreatic
head hematoma, potentially arising from branches of the GDA and/or
pancreaticoduodenal arcade.

No extravasation of contrast identified within the left upper
quadrant, with no source identified in the left upper quadrant.

Evidence of diffuse vasospasm.

Given the presence of hematoma and edema/stranding near the
pancreatic head, acute pancreatitis cannot be excluded.

Preliminary results were discussed by telephone at the time of
interpretation on [DATE] at [DATE] to PA MANIJEH,

## 2019-03-29 MED ORDER — LACTATED RINGERS IV BOLUS: 500.0000 mL | Freq: Once | INTRAVENOUS | Status: DC

## 2019-03-29 MED ORDER — SODIUM CHLORIDE 0.9% IV SOLUTION: Freq: Once | INTRAVENOUS | Status: DC

## 2019-03-29 MED ORDER — LIDOCAINE HCL 1 % IJ SOLN
INTRAMUSCULAR | Status: AC
  Filled 2019-03-29: qty 20

## 2019-03-29 MED ORDER — SODIUM CHLORIDE 0.9% IV SOLUTION: Freq: Once | INTRAVENOUS | Status: AC

## 2019-03-29 MED ORDER — MIDAZOLAM HCL 2 MG/2ML IJ SOLN
INTRAMUSCULAR | Status: AC
  Filled 2019-03-29: qty 2

## 2019-03-29 MED ORDER — IOHEXOL 300 MG/ML  SOLN
150.0000 mL | Freq: Once | INTRAMUSCULAR | Status: AC | PRN
  Administered 2019-03-29: 75 mL via INTRA_ARTERIAL

## 2019-03-29 MED ORDER — FENTANYL CITRATE (PF) 100 MCG/2ML IJ SOLN
INTRAMUSCULAR | Status: AC | PRN
  Administered 2019-03-29 (×4): 25 ug via INTRAVENOUS

## 2019-03-29 MED ORDER — IOHEXOL 300 MG/ML  SOLN
150.0000 mL | Freq: Once | INTRAMUSCULAR | Status: AC | PRN
  Administered 2019-03-29: 50 mL via INTRA_ARTERIAL

## 2019-03-29 MED ORDER — LACTATED RINGERS IV SOLN: INTRAVENOUS | Status: DC

## 2019-03-29 MED ORDER — IOHEXOL 350 MG/ML SOLN
100.0000 mL | Freq: Once | INTRAVENOUS | Status: AC | PRN
  Administered 2019-03-29: 100 mL via INTRAVENOUS

## 2019-03-29 MED ORDER — MIDAZOLAM HCL 2 MG/2ML IJ SOLN
INTRAMUSCULAR | Status: AC | PRN
  Administered 2019-03-29 (×2): 0.5 mg via INTRAVENOUS
  Administered 2019-03-29: 1 mg via INTRAVENOUS

## 2019-03-29 MED ORDER — NOREPINEPHRINE 4 MG/250ML-% IV SOLN
0.0000 ug/min | INTRAVENOUS | Status: DC
  Filled 2019-03-29: qty 250

## 2019-03-29 MED ORDER — FENTANYL CITRATE (PF) 100 MCG/2ML IJ SOLN
INTRAMUSCULAR | Status: AC
  Filled 2019-03-29: qty 2

## 2019-03-29 NOTE — Progress Notes (Signed)
Pt ransfer to [facility] for IR procedure. Report given to [facility] ICU nurse and [facility] team. Belongings sent with pt. Attempt made to contact family to make aware of pt transfer but no answer. Will try again.

## 2019-03-29 NOTE — Progress Notes (Signed)
[facility]  Case discussed with Dr. Florene Glen and Marni Griffon today at length. Appears to be bleeding again, hypotensive. Agree with blood transfusion and CT angio ab/pelvis to see if bleeding source would be amenable to IR embolization.   We will place a CVL, agree with levophed, blood transfusion  General surgery aware Transfer to [facility] today  [name] Awkward, MD [place] [facility] Pager: [phone] Cell: [phone] If no response, call [phone]

## 2019-03-29 NOTE — Progress Notes (Addendum)
PROGRESS NOTE    Rebecca Bruce  JWJ:[id number] DOB: 08/04/1960 DOA: 03/11/2019 PCP: Alba Cory, MD   Brief Narrative:  Patient is a58 y.o.femalewith PMHx ofhypothyroidism, HTN, intellectual disability-lives in [facility] for the past 20 years (normally fairly independent and talks)-brought in to [facility] on 11/28 for altered mental status, poor oral intake-further evaluation revealed COVID-19 pneumonia. Patient was subsequently admitted to the hospitalist service at [facility] on steroids and remdesivir. Patient was thought to have encephalopathy secondary to Covid 19-CT head x2 was negative at [facility]. She was subsequently transferred to [facility] on 11/30 for further ongoing care.  Hospital course was complicated by significant catatonia for which neurology was consulted, and at 1 point patient was unable to protect her airway and required to be intubated.  She also developed shock of unknown etiology on 12/15-12/16 requiring pressors, now resolved, and she was transferred to [facility] on 12/17.  Significant events 11/28 Admission [facility] 11/30 Transfer to [facility] 12/04 Intubation for inability to protect airway 12/05 Diprivan, fentanyl gtt. Suctioned large plug by RT >>Peak pressure improved. Tol SBT 12/12 Tx to GVC. Sedation decreased but put back on fentanyl drip overnight for tachycardia. Failed SBT 12/13 PSV wean, mental status barrier to extubation 12/14: Extubated. During the evening hours patient hypotensive. This was after receiving propranolol placed on Neo-Synephrine and administered albumin 12/15: phenylephrine doses increasing.  12/16: pressor demands down after volume, stress steroids, and art line placement.   12/17: Transfer to [facility].  New onset profound anemia with hemoglobin 3.4 12/18: awaiting transfer to [facility].  S/p 3 units pRBC.  BP remains on the low side, but with appropriate MAP, will give another unit pRBC and follow closely.  Plan for additional 2 units pRBC.   PCCM to place central line.  Levophed ordered to start if MAP below 65.  Assessment & Plan:   Active Problems:   Acute metabolic encephalopathy   Encounter for intubation   Catatonia   Acute respiratory failure with hypoxemia (HCC)   COVID-19 virus detected   Pressure injury of skin  Addendum: [facility]  IR called to discuss presence of visceral vasculitis.  I requested IR discuss this with PCCM as they've now taken over service with patient at [facility].  Spoke with nurse on floor and requested type and screen and blood products be given as ordered (2 units pRBC, 2 FFP, and 1 unit platelets pending at this time per surgery).  Acute anemia due to hemorrhagic shock and intra-abdominal bleed -patient's last hemoglobin was 11.6 on 12/14 -S/p 3 units pRBC -will order additional 2 units pRBC as Hb inappropriately dropped on repeat -no evidence of external bleeding -follow coags, platelets, and hemoglobin post transfusion -CT abdomen/pelvis with 13.9 by 8.7 cm hemorrhagic LUQ mass in the splenic flexure with moderate volume hemoperitoneum - hemorrhage from underlying colonic neoplasm not excluded.  Hemorrhagic shock -Initially shock without clear etiology, empiric work-up showed cortisol appropriate at 65.6 12/15, TSH 3.2 11/28 -Patient required vasopressors 12/15-12/16, she was placed on stress of steroids, and now she is off pressors and normotensive - after rounding, SBP now in 90's, MAP is appropriate - may need to restart pressors, follow closely with additional pRBC.  - CT findings with hemorrhagic LUQ mass as noted above - Case was discussed with general surgery and IR. Will plan for transfer to [facility]  - Plan for CT abd/pelvis and IR consult.  Surgery following as well.  AKI -Likely due to shock, blood pressure now is  normalized, watch renal function.  Avoid nephrotoxic medications - labs pending today.  Elevated LFTs -Likely shock liver, watch LFTs - pending labs  today. -Discontinue statin  Acute Hypoxic Respiratory Failure due to Covid-19 Viral Illness -This is resolved, she is on room air, extubated.  CT scan of the abdomen pelvis today shows residual patchy groundglass opacities and consolidation in both lungs compatible with COVID-19 pneumonia  Persistent encephalopathy with catatonia, underlying mental retardation living in [facility]  -neurology consulted, s/p LP. Treated with benzodiazepines, also antibiotics for meningitis coverage 12/2-12/7, LP negative for bacterial meningitis with 1 WBC, cultures negative. 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 - consider repeat neurology c/s  Hypothyroidism -continue synthroid, TSH normal   HLD -Hold atorvastatin due to elevated LFTs  Peripheral neuropathy -gabapentin on hold   Leukocytosis: likely 2/2 steroids, follow, afebrile   DVT prophylaxis: SCD Code Status: full  Family Communication: none at bedside - discussed with sister Disposition Plan: pending further improvement  Consultants:   PCCM  Surgery  IR  Procedures:  12/03 EEG > mod to severe diffuse encephalopathy, non-specific etiology but could be due to toxic metabolic causes  95/62 cortrak   Antimicrobials:  Anti-infectives (From admission, onward)   Start     Dose/Rate Route Frequency Ordered Stop   03/15/19 1700  metroNIDAZOLE (FLAGYL) IVPB 500 mg  Status:  Discontinued     500 mg 100 mL/hr over 60 Minutes Intravenous Every 8 hours 03/15/19 1655 03/19/19 0935   03/14/19 0600  vancomycin (VANCOCIN) IVPB 1000 mg/200 mL premix     1,000 mg 200 mL/hr over 60 Minutes Intravenous Every 12 hours 03/13/19 1618 03/18/19 1909   03/13/19 2200  cefTRIAXone  (ROCEPHIN) 2 g in sodium chloride 0.9 % 100 mL IVPB     2 g 200 mL/hr over 30 Minutes Intravenous Every 12 hours 03/13/19 1618 03/18/19 2340   03/13/19 1630  vancomycin (VANCOCIN) 2,000 mg in sodium chloride 0.9 % 500 mL IVPB     2,000 mg 250 mL/hr over 120 Minutes Intravenous  Once 03/13/19 1618 03/14/19 0700   03/12/19 1800  vancomycin (VANCOCIN) 1,750 mg in sodium chloride 0.9 % 500 mL IVPB  Status:  Discontinued     1,750 mg 250 mL/hr over 120 Minutes Intravenous Every 24 hours 03/11/19 1938 03/12/19 0903   03/12/19 1000  remdesivir 100 mg in sodium chloride 0.9 % 250 mL IVPB     100 mg 500 mL/hr over 30 Minutes Intravenous Every 24 hours 03/11/19 1943 03/14/19 1055   03/11/19 2200  meropenem (MERREM) 1 g in sodium chloride 0.9 % 100 mL IVPB  Status:  Discontinued     1 g 200 mL/hr over 30 Minutes Intravenous Every 8 hours 03/11/19 1938 03/13/19 1604     Subjective: Tracks, but does not speak or meaningfully follow commands  Objective: Vitals:   03/29/19 0800 03/29/19 0830 03/29/19 0900 03/29/19 1000  BP: (!) 152/75 (!) 115/57 (!) 110/94 (!) 79/36  Pulse: 83 81 97 (!) 107  Resp: (!) 26 (!) 25 (!) 26 (!) 28  Temp:  98.1 F (36.7 C)    TempSrc:  Oral    SpO2: 100% 100% 97% 100%  Weight:      Height:  Intake/Output Summary (Last 24 hours) at 03/29/2019 1100 Last data filed at 03/29/2019 1000 Gross per 24 hour  Intake 1814.66 ml  Output 955 ml  Net 859.66 ml   Filed Weights   03/22/19 0500 03/24/19 0449 03/25/19 0223  Weight: 88.8 kg 88.9 kg 87.9 kg    Examination:  General exam: Appears calm and comfortable  Respiratory system: unlabored  Cardiovascular system: S1 & S2 heard, RRR.  Gastrointestinal system: Abdomen is nondistended, soft and nontender. Central nervous system: Tracks, does not follow commands or meaningfully interact.  Does not withdraw from painful stimuli. Extremities: no LEE Skin: No rashes, lesions or ulcers   Data Reviewed: I have  personally reviewed following labs and imaging studies  CBC: Recent Labs  Lab 03/24/19 0600 03/25/19 0340 03/28/19 0655 03/28/19 1547 03/29/19 0130  WBC  --  11.8* 15.5* 20.4* 27.1*  HGB 12.6 11.6* 3.9* 7.4* 6.9*  HCT 37.0 36.6 12.2* 21.6* 19.9*  MCV  --  83.8 84.7 86.1 84.7  PLT  --  252 152 144* 159   Basic Metabolic Panel: Recent Labs  Lab 03/23/19 0602 03/23/19 0916 03/24/19 0600 03/25/19 0340 03/26/19 0705 03/28/19 0655  NA 139 137 135 136 137 140  K 3.9 3.8 3.7 3.8 5.1 3.4*  CL 101  --   --  101 105 108  CO2 29  --   --  28 21* 21*  GLUCOSE 98  --   --  131* 112* 244*  BUN 21*  --   --  19 26* 73*  CREATININE 0.45  --   --  0.40* 0.87 1.41*  CALCIUM 8.4*  --   --  8.3* 7.8* 7.5*  MG 2.1  --   --   --   --  2.3  PHOS 3.8  --   --   --   --   --    GFR: Estimated Creatinine Clearance: 45.3 mL/min (Nakia Remmers) (by C-G formula based on SCr of 1.41 mg/dL (H)). Liver Function Tests: Recent Labs  Lab 03/28/19 1030  AST 588*  ALT 1,684*  ALKPHOS 84  BILITOT 0.8  PROT 5.1*  ALBUMIN 2.2*   No results for input(s): LIPASE, AMYLASE in the last 168 hours. No results for input(s): AMMONIA in the last 168 hours. Coagulation Profile: Recent Labs  Lab 03/28/19 1100  INR 1.3*   Cardiac Enzymes: No results for input(s): CKTOTAL, CKMB, CKMBINDEX, TROPONINI in the last 168 hours. BNP (last 3 results) No results for input(s): PROBNP in the last 8760 hours. HbA1C: No results for input(s): HGBA1C in the last 72 hours. CBG: Recent Labs  Lab 03/28/19 1544 03/28/19 1950 03/29/19 0002 03/29/19 0323 03/29/19 0744  GLUCAP 146* 117* 112* 122* 134*   Lipid Profile: No results for input(s): CHOL, HDL, LDLCALC, TRIG, CHOLHDL, LDLDIRECT in the last 72 hours. Thyroid Function Tests: No results for input(s): TSH, T4TOTAL, FREET4, T3FREE, THYROIDAB in the last 72 hours. Anemia Panel: No results for input(s): VITAMINB12, FOLATE, FERRITIN, TIBC, IRON, RETICCTPCT in the last 72  hours. Sepsis Labs: No results for input(s): PROCALCITON, LATICACIDVEN in the last 168 hours.  Recent Results (from the past 240 hour(s))  CSF culture     Status: None   Collection Time: 03/20/19  3:11 PM   Specimen: PATH Cytology CSF; Cerebrospinal Fluid  Result Value Ref Range Status   Specimen Description CSF  Final   Special Requests NONE  Final   Gram Stain   Final    WBC PRESENT,BOTH PMN  AND MONONUCLEAR NO ORGANISMS SEEN CYTOSPIN SMEAR    Culture   Final    NO GROWTH 3 DAYS Performed at [facility], [address] [place], [place] [address]    Report Status 03/23/2019 FINAL  Final         Radiology Studies: CT ABDOMEN PELVIS WO CONTRAST  Result Date: 03/28/2019 CLINICAL DATA:  Inpatient.  Given 19 pneumonia.  Dyspnea.  Anemia. EXAM: CT CHEST, ABDOMEN AND PELVIS WITHOUT CONTRAST TECHNIQUE: Multidetector CT imaging of the chest, abdomen and pelvis was performed following the standard protocol without IV contrast. COMPARISON:  03/24/2019 chest radiograph. FINDINGS: CT CHEST FINDINGS Motion degraded scan limits assessment. Cardiovascular: Top-normal heart size. No significant pericardial effusion/thickening. Left anterior descending coronary atherosclerosis. Mildly atherosclerotic nonaneurysmal thoracic aorta. Normal caliber pulmonary arteries. Mediastinum/Nodes: No discrete thyroid nodules. Enteric tube terminates in body of the stomach. No pathologically enlarged axillary, mediastinal or hilar lymph nodes, noting limited sensitivity for the detection of hilar adenopathy on this noncontrast study. Lungs/Pleura: No pneumothorax. No pleural effusion. Moderate patchy consolidation and ground-glass opacity throughout both lungs, with Harlym Gehling ground-glass opacities most prominent in the upper lobes and with the consolidation most prominent in the dependent lower lobes. Posterior left upper lobe 6 mm pulmonary nodule (series 4/image 19). No lung masses or additional significant  pulmonary nodules on this motion degraded scan. Musculoskeletal: No aggressive appearing focal osseous lesions. Mild thoracic spondylosis. CT ABDOMEN PELVIS FINDINGS Hepatobiliary: Normal liver with no liver mass. Cholelithiasis. No gallbladder wall thickening or pericholecystic fluid. No biliary ductal dilatation. Pancreas: Normal, with no mass or duct dilation. Spleen: Normal size. No mass. Adrenals/Urinary Tract: Normal adrenals. No renal stones. No hydronephrosis. Simple 2.5 cm medial upper left renal cyst. No additional contour deforming renal lesions. Foley catheter terminates in the bladder. Tiny foci of nondependent bladder gas compatible with instrumentation. Otherwise normal bladder. Stomach/Bowel: Enteric tube loops in the gastric fundus with the tip in the body of the stomach. Stomach is nondistended and is unremarkable. Normal caliber small bowel with no small bowel wall thickening. Candidate normal appendix in the right lower quadrant. There is Michon Kaczmarek large hemorrhagic 13.9 x 8.7 cm left upper quadrant mass (series 2/image 52) centered in the splenic flexure of the colon with heterogeneous hyperdensity and surrounding extensive fat stranding. Otherwise unremarkable nondistended large bowel. Vascular/Lymphatic: Mildly atherosclerotic nonaneurysmal abdominal aorta. No pathologically enlarged lymph nodes in the abdomen or pelvis. Reproductive: There is Letricia Krinsky coarse 3.7 cm right pelvic calcification (series 2/image 100), presumably Evanne Matsunaga degenerated calcified fibroid, poorly delineated on this noncontrast scan. No discrete adnexal masses. Other: No pneumoperitoneum. Moderate volume hemoperitoneum, most prominent in the pelvis. No focal fluid collections. Mild body wall anasarca. Musculoskeletal: No aggressive appearing focal osseous lesions. IMPRESSION: 1. Large 13.9 x 8.7 cm hemorrhagic left upper quadrant mass centered in the splenic flexure of the colon with moderate volume hemoperitoneum, most prominent in the  pelvis. Acute hemorrhage arising from an underlying colonic neoplasm not excluded. 2. No free air.  No findings of bowel obstruction. 3. Moderate patchy ground-glass opacity and consolidation in both lungs, with an appearance compatible with COVID-19 pneumonia. 4. Left upper lobe 6 mm solid pulmonary nodule. Non-contrast chest CT at 6-12 months is recommended. If the nodule is stable at time of repeat CT, then future CT at 18-24 months (from today's scan) is considered optional for low-risk patients, but is recommended for high-risk patients. This recommendation follows the consensus statement: Guidelines for Management of Incidental Pulmonary Nodules Detected on CT Images:From  the Fleischner Society 2017; published online before print (10.1148/[id number]). 5. One vessel coronary atherosclerosis. 6. Cholelithiasis. 7. Enteric tube terminates in the body of the stomach. 8.  Aortic Atherosclerosis (ICD10-I70.0). Critical Value/emergent results were called by telephone at the time of interpretation on 03/28/2019 at 10:31 am to provider DR. BRENT MCQUAID, who verbally acknowledged these results. Electronically Signed   By: Delbert Phenix M.D.   On: 03/28/2019 10:32   CT HEAD WO CONTRAST  Result Date: 03/28/2019 CLINICAL DATA:  Encephalopathy EXAM: CT HEAD WITHOUT CONTRAST TECHNIQUE: Contiguous axial images were obtained from the base of the skull through the vertex without intravenous contrast. COMPARISON:  03/09/2019 FINDINGS: Brain: Study limited by motion artifact. No signs of intracranial hemorrhage, mass, mass effect, midline shift or hydrocephalus. Vascular: No hyperdense vessel or unexpected calcification. Skull: Normal. Negative for fracture or focal lesion. Sinuses/Orbits: No acute finding. Other: None. IMPRESSION: Study limited by motion artifact. No acute intracranial abnormality. Electronically Signed   By: Donzetta Kohut M.D.   On: 03/28/2019 10:01   CT CHEST WO CONTRAST  Result Date:  03/28/2019 CLINICAL DATA:  Inpatient.  Given 19 pneumonia.  Dyspnea.  Anemia. EXAM: CT CHEST, ABDOMEN AND PELVIS WITHOUT CONTRAST TECHNIQUE: Multidetector CT imaging of the chest, abdomen and pelvis was performed following the standard protocol without IV contrast. COMPARISON:  03/24/2019 chest radiograph. FINDINGS: CT CHEST FINDINGS Motion degraded scan limits assessment. Cardiovascular: Top-normal heart size. No significant pericardial effusion/thickening. Left anterior descending coronary atherosclerosis. Mildly atherosclerotic nonaneurysmal thoracic aorta. Normal caliber pulmonary arteries. Mediastinum/Nodes: No discrete thyroid nodules. Enteric tube terminates in body of the stomach. No pathologically enlarged axillary, mediastinal or hilar lymph nodes, noting limited sensitivity for the detection of hilar adenopathy on this noncontrast study. Lungs/Pleura: No pneumothorax. No pleural effusion. Moderate patchy consolidation and ground-glass opacity throughout both lungs, with [place] ground-glass opacities most prominent in the upper lobes and with the consolidation most prominent in the dependent lower lobes. Posterior left upper lobe 6 mm pulmonary nodule (series 4/image 19). No lung masses or additional significant pulmonary nodules on this motion degraded scan. Musculoskeletal: No aggressive appearing focal osseous lesions. Mild thoracic spondylosis. CT ABDOMEN PELVIS FINDINGS Hepatobiliary: Normal liver with no liver mass. Cholelithiasis. No gallbladder wall thickening or pericholecystic fluid. No biliary ductal dilatation. Pancreas: Normal, with no mass or duct dilation. Spleen: Normal size. No mass. Adrenals/Urinary Tract: Normal adrenals. No renal stones. No hydronephrosis. Simple 2.5 cm medial upper left renal cyst. No additional contour deforming renal lesions. Foley catheter terminates in the bladder. Tiny foci of nondependent bladder gas compatible with instrumentation. Otherwise normal bladder.  Stomach/Bowel: Enteric tube loops in the gastric fundus with the tip in the body of the stomach. Stomach is nondistended and is unremarkable. Normal caliber small bowel with no small bowel wall thickening. Candidate normal appendix in the right lower quadrant. There is Cha Gomillion large hemorrhagic 13.9 x 8.7 cm left upper quadrant mass (series 2/image 52) centered in the splenic flexure of the colon with heterogeneous hyperdensity and surrounding extensive fat stranding. Otherwise unremarkable nondistended large bowel. Vascular/Lymphatic: Mildly atherosclerotic nonaneurysmal abdominal aorta. No pathologically enlarged lymph nodes in the abdomen or pelvis. Reproductive: There is Jony Ladnier coarse 3.7 cm right pelvic calcification (series 2/image 100), presumably Edrian Melucci degenerated calcified fibroid, poorly delineated on this noncontrast scan. No discrete adnexal masses. Other: No pneumoperitoneum. Moderate volume hemoperitoneum, most prominent in the pelvis. No focal fluid collections. Mild body wall anasarca. Musculoskeletal: No aggressive appearing focal osseous lesions. IMPRESSION: 1. Large 13.9 x 8.7 cm  hemorrhagic left upper quadrant mass centered in the splenic flexure of the colon with moderate volume hemoperitoneum, most prominent in the pelvis. Acute hemorrhage arising from an underlying colonic neoplasm not excluded. 2. No free air.  No findings of bowel obstruction. 3. Moderate patchy ground-glass opacity and consolidation in both lungs, with an appearance compatible with COVID-19 pneumonia. 4. Left upper lobe 6 mm solid pulmonary nodule. Non-contrast chest CT at 6-12 months is recommended. If the nodule is stable at time of repeat CT, then future CT at 18-24 months (from today's scan) is considered optional for low-risk patients, but is recommended for high-risk patients. This recommendation follows the consensus statement: Guidelines for Management of Incidental Pulmonary Nodules Detected on CT Images:From the Fleischner  Society 2017; published online before print (10.1148/[id number]). 5. One vessel coronary atherosclerosis. 6. Cholelithiasis. 7. Enteric tube terminates in the body of the stomach. 8.  Aortic Atherosclerosis (ICD10-I70.0). Critical Value/emergent results were called by telephone at the time of interpretation on 03/28/2019 at 10:31 am to provider DR. BRENT MCQUAID, who verbally acknowledged these results. Electronically Signed   By: Delbert Phenix M.D.   On: 03/28/2019 10:32   DG Abd Portable 1V  Result Date: 03/27/2019 CLINICAL DATA:  Check gastric catheter placement EXAM: PORTABLE ABDOMEN - 1 VIEW COMPARISON:  None. FINDINGS: Feeding catheter is noted coiled within the gastric bubble. No obstructive changes are seen. No acute bony abnormality is noted. IMPRESSION: Feeding catheter coiled within the stomach. Electronically Signed   By: Alcide Clever M.D.   On: 03/27/2019 12:27        Scheduled Meds:  sodium chloride   Intravenous Once   sodium chloride   Intravenous Once   Chlorhexidine Gluconate Cloth  6 each Topical Daily   docusate  100 mg Per Tube Daily   hydrocortisone sodium succinate  50 mg Intravenous Q6H   insulin aspart  0-15 Units Subcutaneous Q4H   levothyroxine  50 mcg Intravenous Daily   sodium chloride flush  10-40 mL Intracatheter Q12H   vitamin C  500 mg Per Tube Daily   zinc sulfate  220 mg Per Tube Daily   Continuous Infusions:  sodium chloride Stopped (03/12/19 1210)   feeding supplement (VITAL AF 1.2 CAL) Stopped (03/28/19 1000)   lactated ringers       LOS: 18 days    Time spent: 45 min critical care time due to acute blood loss anemia    Lacretia Nicks, MD [facility] Pager [email or web address]  If 7PM-7AM, please contact night-coverage [email or web address] Password [email or web address] 03/29/2019, 11:00 AM

## 2019-03-29 NOTE — Sedation Documentation (Signed)
Informed Rad about MD information acknowledeged

## 2019-03-29 NOTE — Progress Notes (Signed)
Discussed w/ Elwyn Reach, PA with CCM and Dr. Florene Glen with [facility] about blood products for this pt. Orders were placed at [facility] for PRBCx2, FFPx2, and platelets x1 prior to transfer to [facility] for IR procedure. Reordered as previously written per [facility] blood bank protocol so they could be administered.

## 2019-03-29 NOTE — Progress Notes (Signed)
Called and spoke to Larena Glassman, as her Brother, Katyra Meckes is POA but has hearing deficits, so she is POC.  I have discussed with her the patient's current situation as well as possible risks and complications if the patient were to need surgery.  She is going to discuss with her family and I told her we would update her once the CTA results were back with further plans for IR vs surgery vs comfort care (pending their conversation.)  Henreitta Cea 2:08 PM 03/29/2019

## 2019-03-29 NOTE — Progress Notes (Signed)
NAME:  Rebecca Bruce, MRN:  [id number], DOB:  [birth year], LOS: 27 ADMISSION DATE:  03/11/2019, CONSULTATION DATE:  12/4 REFERRING MD:  Sloan Leiter, CHIEF COMPLAINT:  Inability to protect airway   Brief History   58 y/o female brought to [facility] on 11/28 in the setting of altered mental status from her group home.  She was found to have COVID pneumonia, treated with decadron and remdesivir then sent to [facility] on 11/30.  Here has had progressive increase in acute encephalopathy and has become less and less responsive.  12/4 PCCM asked to see her because she reached the point that she was aspirating and not responding to pharyngeal/tracheal suctioning.  By report her exam findings have waxed and waned over the last three days but have not generally moved in the right direction.  Neurology saw her on 12/2 and apparently at that time she followed some simple commands, but she was non-verbal at the time with significant rigidity noted in her upper extremities.   At baseline lives in a group home, can bathe herself and have a conversation.  Has baseline anxiety and depression.  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   Past Medical History  Thyroid disease Hypertension GERD Anxiety  Depression [place] [facility] Events   11/28 Admission [facility] 11/30 Transfer to [facility] 12/04 Intubation for inability to protect airway 12/05 Diprivan, fentanyl gtt. Suctioned large plug by RT >> Peak pressure improved.  Tol SBT 12/12 Tx to [place]. Sedation decreased but put back on fentanyl drip overnight for tachycardia.  Failed SBT 12/13 PSV wean, mental status barrier to extubation 12/14: Extubated.  During the evening hours patient hypotensive.  This was after receiving propranolol placed on Neo-Synephrine and administered albumin 12/15: phenylephrine doses increasing.  12/16: pressor demands down after volume, stress steroids, and  art line placement.  12/17: Hemoglobin acutely dropped from 11.6 down to 3.9, received 2 units of blood, CT abdomen pelvis showing large new hemoperitoneum involving the left upper quadrant of the abdomen near the splenic flexure of the colon etiology was not clear.  All anticoagulation was discontinued. 12/18: Received another unit of blood overnight, hemoglobin had gone from 7.4 down to 6.9, with no bump following transfusion.  General surgery consulted.  Interventional radiology also consulted.  Moving to [facility] for either surgical air or interventional intervention for what appears to be active bleeding on CT abdomen angio Consults:  Neurology  Procedures:  12/4 ETT >>12/15  Significant Diagnostic Tests:  11/28 CT head > mucosal thickening in ethmoid air cells, study otherwise unremarkable  12/03 MRI Brain > normal MRI brain  12/03 EEG > mod to severe diffuse encephalopathy, non-specific etiology but could be due to toxic metabolic causes CT abdomen pelvis 12/17 large new hemoperitoneum in the left upper quadrant of the abdomen near the splenic flexure of the colon etiology not clear negative for free air or obstruction.  R Micro Data:  11/27 urine culture > multiple species 11/28 blood > negative 11/28 SARS COV 2 > positive 12/04 sputum > normal flora  Antimicrobials:  11/28 Vanc > 11/30, restart 12/2 > 12/7 11/28 mero >  12/2 11/29 remdesivir > 12/3 12/02 ceftriaxone > 12/7 12/04 flagyl > off  Interim history/subjective:   Hemoglobin acutely dropped from 11.6 down to 3.9 now has hemoperitoneum Objective   Blood pressure (Abnormal) 110/93, pulse 61, temperature (Pended) 97.7 F (36.5 C),  temperature source (Pended) Oral, resp. rate (Abnormal) 28, height 5\' 2"  (1.575 m), weight 87.9 kg, SpO2 100 %.        Intake/Output Summary (Last 24 hours) at 03/29/2019 1509 Last data filed at 03/29/2019 1400 Gross per 24 hour  Intake 2942.65 ml  Output 840 ml  Net 2102.65 ml    Filed Weights   03/22/19 0500 03/24/19 0449 03/25/19 0223  Weight: 88.8 kg 88.9 kg 87.9 kg    Examination: General: 58 year old female patient resting in bed currently on room air does not appear to be in acute distress HEENT normocephalic pale mucous membranes no JVD Pulmonary diminished throughout no accessory use Cardiac: Regular rate and rhythm Abdomen: Soft, no guarding or tenderness noted positive bowel sounds Extremities: Warm and dry Neuro: Remains unresponsive, will lift her head off the bed, groans intermittently. GU: Untreated yellow  Resolved Hospital Problem list   Hypotension  Acute hypoxic respiratory failure Assessment & Plan:   Hypovolemic shock with acute blood loss anemia w/ spontaneous hemoperitoneum.  Etiology is not entirely still. CT angiogram completed: Preliminarily suggests some active bleeding but source is not entirely clear Plan Needs to go to [facility], awaiting general surgery decision in regards to attempting interventional radiology first for embolization, concern here being not entirely clear target vessel versus exploratory laparotomy  Will increase maintenance IV fluids in addition to transfuse as indicated Central access placed Titrate norepinephrine for mean arterial pressure greater than 65  COVID pneumonia: Successfully extubated on 12/14, but remains an aspiration risk Plan Continue pulse ox Aspiration cautions  Persistent encephalopathy with catatonia, has history of underlying mental retardation EEG, MRI benign. Has been evaluated by neurology. Only workup remaining is for stiff person sydrome and anti-GAD antibodies are pending as below.  Plan Supportive care  Relative adrenal dysfunction Plan Stress dose steroids  Hypothyroidism Plan Synthroid  Pulmonary nodule.  Left upper lobe 6 mm in size Plan/recommendation CT 6 to 12 months  Best practice:  Diet: tube feeding DVT prophylaxis: lovenox per protocol GI  prophylaxis: famotidine Mobility:OOB Code Status: full Disposition: ICU  Family: [name]) updated via phone 12/16 on patient status.   Family indicates she is "frequently afraid and will sit staring off into space and not answer".  [name] indicates we should try to tell her that "[name] is coming to get her and she needs to get up".    Transfer to [place] care x32 minutes Erick Colace ACNP-BC [facility] Pager # [phone] OR # [phone] if no answer

## 2019-03-29 NOTE — Procedures (Signed)
Central Venous Catheter Insertion Procedure Note Rebecca Bruce [id number] 12-Feb-1961  Procedure: Insertion of Central Venous Catheter Indications: Assessment of intravascular volume, Drug and/or fluid administration and Frequent blood sampling  Procedure Details Consent: Unable to obtain consent because of emergent medical necessity. Time Out: Verified patient identification, verified procedure, site/side was marked, verified correct patient position, special equipment/implants available, medications/allergies/relevent history reviewed, required imaging and test results available.  Performed Real time [place] used to ID and cannulate vessel  Maximum sterile technique was used including antiseptics, cap, gloves, gown, hand hygiene, mask and sheet. Skin prep: Chlorhexidine; local anesthetic administered A antimicrobial bonded/coated triple lumen catheter was placed in the right internal jugular vein using the Seldinger technique.  Evaluation Blood flow good Complications: No apparent complications Patient did tolerate procedure well. Chest X-ray ordered to verify placement.  CXR: normal.  Clementeen Graham 03/29/2019, 3:20 PM

## 2019-03-29 NOTE — Progress Notes (Signed)
[place] Progress Note Patient Name: Rebecca Bruce DOB: 1960-09-26 MRN: [id number]   Date of Service  03/29/2019  HPI/Events of Note  Anemia - Hgb = 6.9.  eICU Interventions  Will transfuse 1 unit PRBC now.      Intervention Category Major Interventions: Other:  Lysle Dingwall 03/29/2019, 3:36 AM

## 2019-03-29 NOTE — Procedures (Signed)
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  Arteries are extremely fragile and abnormal in the small to medium size range.  There is no target for embolization given the diffuse nature of the process.   Complications: None  Estimated Blood Loss: None  Recommendations: - Bedrest x 4 hrs with right leg straight - Trend H&H and transfuse as needed - Supportive care  Signed,  Criselda Peaches, MD

## 2019-03-29 NOTE — Progress Notes (Signed)
PT Cancellation Note  Patient Details Name: Rebecca Bruce MRN: [id number] DOB: 1960/06/28   Cancelled Treatment:    Reason Eval/Treat Not Completed: Medical issues which prohibited therapy, to transfer to [facility] for close watching of intraabdominal hemorrhaging.    [name] 03/29/2019, 7:17 AM [place] Pager [phone] Office [phone]

## 2019-03-29 NOTE — Progress Notes (Signed)
PCCM progress note:  Pt evaluated after returning from IR who recommends supportive care at this time.  Pt awake, grimacing to pain, does not answer questions (has been intermittently catatonic), vitals stable.  Will continue to monitor h/h and finish transfusion previously ordered.  If worsening will need to discuss with family and surgery.

## 2019-03-29 NOTE — Progress Notes (Addendum)
Patient ID: Zeyda Hamed, female   DOB: 1961-01-10, 58 y.o.   MRN: [id number]       Subjective: Patient with no neurologic or mental change.  Still catatonic essentially.  Has become hypotensive throughout the morning with drops in her hgb.  Has received 1 unit of pRBCs and has 2 more ordered.    ROS: unable  Objective: Vital signs in last 24 hours: Temp:  [97.9 F (36.6 C)-99.3 F (37.4 C)] 98.1 F (36.7 C) (12/18 0830) Pulse Rate:  [66-107] 102 (12/18 1100) Resp:  [21-31] 31 (12/18 1100) BP: (67-153)/(33-94) 67/33 (12/18 1100) SpO2:  [95 %-100 %] 100 % (12/18 1100) Arterial Line BP: (91-166)/(69-102) 91/71 (12/18 1100) Last BM Date: 03/28/19  Intake/Output from previous day: 12/17 0701 - 12/18 0700 In: 1719.7 [Blood:1031.7; NG/GT:305] Out: 1060 [Urine:1060] Intake/Output this shift: Total I/O In: 200 [NG/GT:200] Out: 60 [Urine:60]  PE: Gen: catatonic Heart: mildly tachy Lungs: clear but with upper airway secretions Abd: more distended today than yesterday, hypoactive BS, unable to assess tenderness Psych: unable  Lab Results:  Recent Labs    03/29/19 0130 03/29/19 1015  WBC 27.1* 27.8*  HGB 6.9* 6.1*  HCT 19.9* 17.5*  PLT 159 138*   BMET Recent Labs    03/28/19 0655 03/29/19 1015  NA 140 143  K 3.4* 4.1  CL 108 111  CO2 21* 17*  GLUCOSE 244* 274*  BUN 73* 79*  CREATININE 1.41* 1.47*  CALCIUM 7.5* 7.7*   PT/INR Recent Labs    03/28/19 1100  LABPROT 15.7*  INR 1.3*   CMP     Component Value Date/Time   NA 143 03/29/2019 1015   NA 144 02/03/2016 0000   K 4.1 03/29/2019 1015   CL 111 03/29/2019 1015   CO2 17 (L) 03/29/2019 1015   GLUCOSE 274 (H) 03/29/2019 1015   BUN 79 (H) 03/29/2019 1015   BUN 11 02/03/2016 0000   CREATININE 1.47 (H) 03/29/2019 1015   CREATININE 1.00 07/13/2018 1023   CALCIUM 7.7 (L) 03/29/2019 1015   PROT 4.8 (L) 03/29/2019 1015   PROT 7.0 01/09/2015 0809   ALBUMIN 2.2 (L) 03/29/2019 1015   ALBUMIN 4.0  01/09/2015 0809   AST 212 (H) 03/29/2019 1015   ALT 1,030 (H) 03/29/2019 1015   ALKPHOS 68 03/29/2019 1015   BILITOT 1.1 03/29/2019 1015   BILITOT 0.7 01/09/2015 0809   GFRNONAA 39 (L) 03/29/2019 1015   GFRNONAA 63 07/13/2018 1023   GFRAA 45 (L) 03/29/2019 1015   GFRAA 73 07/13/2018 1023   Lipase  No results found for: LIPASE     Studies/Results: CT ABDOMEN PELVIS WO CONTRAST  Result Date: 03/28/2019 CLINICAL DATA:  Inpatient.  Given 19 pneumonia.  Dyspnea.  Anemia. EXAM: CT CHEST, ABDOMEN AND PELVIS WITHOUT CONTRAST TECHNIQUE: Multidetector CT imaging of the chest, abdomen and pelvis was performed following the standard protocol without IV contrast. COMPARISON:  03/24/2019 chest radiograph. FINDINGS: CT CHEST FINDINGS Motion degraded scan limits assessment. Cardiovascular: Top-normal heart size. No significant pericardial effusion/thickening. Left anterior descending coronary atherosclerosis. Mildly atherosclerotic nonaneurysmal thoracic aorta. Normal caliber pulmonary arteries. Mediastinum/Nodes: No discrete thyroid nodules. Enteric tube terminates in body of the stomach. No pathologically enlarged axillary, mediastinal or hilar lymph nodes, noting limited sensitivity for the detection of hilar adenopathy on this noncontrast study. Lungs/Pleura: No pneumothorax. No pleural effusion. Moderate patchy consolidation and ground-glass opacity throughout both lungs, with a ground-glass opacities most prominent in the upper lobes and with the consolidation most prominent in  the dependent lower lobes. Posterior left upper lobe 6 mm pulmonary nodule (series 4/image 19). No lung masses or additional significant pulmonary nodules on this motion degraded scan. Musculoskeletal: No aggressive appearing focal osseous lesions. Mild thoracic spondylosis. CT ABDOMEN PELVIS FINDINGS Hepatobiliary: Normal liver with no liver mass. Cholelithiasis. No gallbladder wall thickening or pericholecystic fluid. No biliary  ductal dilatation. Pancreas: Normal, with no mass or duct dilation. Spleen: Normal size. No mass. Adrenals/Urinary Tract: Normal adrenals. No renal stones. No hydronephrosis. Simple 2.5 cm medial upper left renal cyst. No additional contour deforming renal lesions. Foley catheter terminates in the bladder. Tiny foci of nondependent bladder gas compatible with instrumentation. Otherwise normal bladder. Stomach/Bowel: Enteric tube loops in the gastric fundus with the tip in the body of the stomach. Stomach is nondistended and is unremarkable. Normal caliber small bowel with no small bowel wall thickening. Candidate normal appendix in the right lower quadrant. There is a large hemorrhagic 13.9 x 8.7 cm left upper quadrant mass (series 2/image 52) centered in the splenic flexure of the colon with heterogeneous hyperdensity and surrounding extensive fat stranding. Otherwise unremarkable nondistended large bowel. Vascular/Lymphatic: Mildly atherosclerotic nonaneurysmal abdominal aorta. No pathologically enlarged lymph nodes in the abdomen or pelvis. Reproductive: There is a coarse 3.7 cm right pelvic calcification (series 2/image 100), presumably a degenerated calcified fibroid, poorly delineated on this noncontrast scan. No discrete adnexal masses. Other: No pneumoperitoneum. Moderate volume hemoperitoneum, most prominent in the pelvis. No focal fluid collections. Mild body wall anasarca. Musculoskeletal: No aggressive appearing focal osseous lesions. IMPRESSION: 1. Large 13.9 x 8.7 cm hemorrhagic left upper quadrant mass centered in the splenic flexure of the colon with moderate volume hemoperitoneum, most prominent in the pelvis. Acute hemorrhage arising from an underlying colonic neoplasm not excluded. 2. No free air.  No findings of bowel obstruction. 3. Moderate patchy ground-glass opacity and consolidation in both lungs, with an appearance compatible with COVID-19 pneumonia. 4. Left upper lobe 6 mm solid pulmonary  nodule. Non-contrast chest CT at 6-12 months is recommended. If the nodule is stable at time of repeat CT, then future CT at 18-24 months (from today's scan) is considered optional for low-risk patients, but is recommended for high-risk patients. This recommendation follows the consensus statement: Guidelines for Management of Incidental Pulmonary Nodules Detected on CT Images:From the Fleischner Society 2017; published online before print (10.1148/radiol.IJ:2314499). 5. One vessel coronary atherosclerosis. 6. Cholelithiasis. 7. Enteric tube terminates in the body of the stomach. 8.  Aortic Atherosclerosis (ICD10-I70.0). Critical Value/emergent results were called by telephone at the time of interpretation on 03/28/2019 at 10:31 am to provider DR. BRENT MCQUAID, who verbally acknowledged these results. Electronically Signed   By: Ilona Sorrel M.D.   On: 03/28/2019 10:32   CT HEAD WO CONTRAST  Result Date: 03/28/2019 CLINICAL DATA:  Encephalopathy EXAM: CT HEAD WITHOUT CONTRAST TECHNIQUE: Contiguous axial images were obtained from the base of the skull through the vertex without intravenous contrast. COMPARISON:  03/09/2019 FINDINGS: Brain: Study limited by motion artifact. No signs of intracranial hemorrhage, mass, mass effect, midline shift or hydrocephalus. Vascular: No hyperdense vessel or unexpected calcification. Skull: Normal. Negative for fracture or focal lesion. Sinuses/Orbits: No acute finding. Other: None. IMPRESSION: Study limited by motion artifact. No acute intracranial abnormality. Electronically Signed   By: Zetta Bills M.D.   On: 03/28/2019 10:01   CT CHEST WO CONTRAST  Result Date: 03/28/2019 CLINICAL DATA:  Inpatient.  Given 19 pneumonia.  Dyspnea.  Anemia. EXAM: CT CHEST, ABDOMEN AND PELVIS  WITHOUT CONTRAST TECHNIQUE: Multidetector CT imaging of the chest, abdomen and pelvis was performed following the standard protocol without IV contrast. COMPARISON:  03/24/2019 chest radiograph.  FINDINGS: CT CHEST FINDINGS Motion degraded scan limits assessment. Cardiovascular: Top-normal heart size. No significant pericardial effusion/thickening. Left anterior descending coronary atherosclerosis. Mildly atherosclerotic nonaneurysmal thoracic aorta. Normal caliber pulmonary arteries. Mediastinum/Nodes: No discrete thyroid nodules. Enteric tube terminates in body of the stomach. No pathologically enlarged axillary, mediastinal or hilar lymph nodes, noting limited sensitivity for the detection of hilar adenopathy on this noncontrast study. Lungs/Pleura: No pneumothorax. No pleural effusion. Moderate patchy consolidation and ground-glass opacity throughout both lungs, with a ground-glass opacities most prominent in the upper lobes and with the consolidation most prominent in the dependent lower lobes. Posterior left upper lobe 6 mm pulmonary nodule (series 4/image 19). No lung masses or additional significant pulmonary nodules on this motion degraded scan. Musculoskeletal: No aggressive appearing focal osseous lesions. Mild thoracic spondylosis. CT ABDOMEN PELVIS FINDINGS Hepatobiliary: Normal liver with no liver mass. Cholelithiasis. No gallbladder wall thickening or pericholecystic fluid. No biliary ductal dilatation. Pancreas: Normal, with no mass or duct dilation. Spleen: Normal size. No mass. Adrenals/Urinary Tract: Normal adrenals. No renal stones. No hydronephrosis. Simple 2.5 cm medial upper left renal cyst. No additional contour deforming renal lesions. Foley catheter terminates in the bladder. Tiny foci of nondependent bladder gas compatible with instrumentation. Otherwise normal bladder. Stomach/Bowel: Enteric tube loops in the gastric fundus with the tip in the body of the stomach. Stomach is nondistended and is unremarkable. Normal caliber small bowel with no small bowel wall thickening. Candidate normal appendix in the right lower quadrant. There is a large hemorrhagic 13.9 x 8.7 cm left upper  quadrant mass (series 2/image 52) centered in the splenic flexure of the colon with heterogeneous hyperdensity and surrounding extensive fat stranding. Otherwise unremarkable nondistended large bowel. Vascular/Lymphatic: Mildly atherosclerotic nonaneurysmal abdominal aorta. No pathologically enlarged lymph nodes in the abdomen or pelvis. Reproductive: There is a coarse 3.7 cm right pelvic calcification (series 2/image 100), presumably a degenerated calcified fibroid, poorly delineated on this noncontrast scan. No discrete adnexal masses. Other: No pneumoperitoneum. Moderate volume hemoperitoneum, most prominent in the pelvis. No focal fluid collections. Mild body wall anasarca. Musculoskeletal: No aggressive appearing focal osseous lesions. IMPRESSION: 1. Large 13.9 x 8.7 cm hemorrhagic left upper quadrant mass centered in the splenic flexure of the colon with moderate volume hemoperitoneum, most prominent in the pelvis. Acute hemorrhage arising from an underlying colonic neoplasm not excluded. 2. No free air.  No findings of bowel obstruction. 3. Moderate patchy ground-glass opacity and consolidation in both lungs, with an appearance compatible with COVID-19 pneumonia. 4. Left upper lobe 6 mm solid pulmonary nodule. Non-contrast chest CT at 6-12 months is recommended. If the nodule is stable at time of repeat CT, then future CT at 18-24 months (from today's scan) is considered optional for low-risk patients, but is recommended for high-risk patients. This recommendation follows the consensus statement: Guidelines for Management of Incidental Pulmonary Nodules Detected on CT Images:From the Fleischner Society 2017; published online before print (10.1148/radiol.IJ:2314499). 5. One vessel coronary atherosclerosis. 6. Cholelithiasis. 7. Enteric tube terminates in the body of the stomach. 8.  Aortic Atherosclerosis (ICD10-I70.0). Critical Value/emergent results were called by telephone at the time of interpretation on  03/28/2019 at 10:31 am to provider DR. BRENT MCQUAID, who verbally acknowledged these results. Electronically Signed   By: Ilona Sorrel M.D.   On: 03/28/2019 10:32    Anti-infectives: Anti-infectives (From admission,  onward)   Start     Dose/Rate Route Frequency Ordered Stop   03/15/19 1700  metroNIDAZOLE (FLAGYL) IVPB 500 mg  Status:  Discontinued     500 mg 100 mL/hr over 60 Minutes Intravenous Every 8 hours 03/15/19 1655 03/19/19 0935   03/14/19 0600  vancomycin (VANCOCIN) IVPB 1000 mg/200 mL premix     1,000 mg 200 mL/hr over 60 Minutes Intravenous Every 12 hours 03/13/19 1618 03/18/19 1909   03/13/19 2200  cefTRIAXone (ROCEPHIN) 2 g in sodium chloride 0.9 % 100 mL IVPB     2 g 200 mL/hr over 30 Minutes Intravenous Every 12 hours 03/13/19 1618 03/18/19 2340   03/13/19 1630  vancomycin (VANCOCIN) 2,000 mg in sodium chloride 0.9 % 500 mL IVPB     2,000 mg 250 mL/hr over 120 Minutes Intravenous  Once 03/13/19 1618 03/14/19 0700   03/12/19 1800  vancomycin (VANCOCIN) 1,750 mg in sodium chloride 0.9 % 500 mL IVPB  Status:  Discontinued     1,750 mg 250 mL/hr over 120 Minutes Intravenous Every 24 hours 03/11/19 1938 03/12/19 0903   03/12/19 1000  remdesivir 100 mg in sodium chloride 0.9 % 250 mL IVPB     100 mg 500 mL/hr over 30 Minutes Intravenous Every 24 hours 03/11/19 1943 03/14/19 1055   03/11/19 2200  meropenem (MERREM) 1 g in sodium chloride 0.9 % 100 mL IVPB  Status:  Discontinued     1 g 200 mL/hr over 30 Minutes Intravenous Every 8 hours 03/11/19 1938 03/13/19 1604       Assessment/Plan H/O HTN COVID - per medicine, just extubated on 12/14 after intubation for a couple of weeks Acute encephalopathy with catatonia - cortrak in place for feeding.  Continue to hold TFs currently incase she rebleeds and needs surgical intervention. AKI - 1.47 ABL anemia - secondary to below, transfuse as needed.  INR normal Transaminitis   Hemorrhagic shock secondary to intraperitoneal  process -patient appears to be be bleeding again today as she is becoming more hypotensive and her hgb is dropping.  She is currently being resuscitated with fluids and blood products. -CTA just completed but awaiting results.  If positive for acute bleeding, will discuss with IR to see if anything to embolize, however, if not, then may require emergent laparotomy for exploration to control bleeding.  -A-line in the low 80s while down for CT.  Gave another 500cc bolus of fluid as we were still waiting on her blood to get here from [facility]. -blood available once we got back to the room.  She is currently getting her next 2 units right now.  CBC to follow transfusion to see where we are in the scheme of things.  -will call brother as well to discuss case with him to re-verify his desire for aggressive care as I think big abdominal surgery is very high risk for her with significant risks and complications including death.  FEN - NPO, hold TFs VTE - on hold ID - none currently   LOS: 18 days    Henreitta Cea , [facility] 03/29/2019, 12:42 PM Please see Amion for pager number during day hours 7:00am-4:30pm

## 2019-03-30 DIAGNOSIS — R578 Other shock: Secondary | ICD-10-CM

## 2019-03-30 LAB — CBC
HCT: 23.2 % — ABNORMAL LOW (ref 36.0–46.0)
HCT: 22.8 % — ABNORMAL LOW (ref 36.0–46.0)
HCT: 22.4 % — ABNORMAL LOW (ref 36.0–46.0)
HCT: 22.5 % — ABNORMAL LOW (ref 36.0–46.0)
Hemoglobin: 8 g/dL — ABNORMAL LOW (ref 12.0–15.0)
Hemoglobin: 8 g/dL — ABNORMAL LOW (ref 12.0–15.0)
Hemoglobin: 7.8 g/dL — ABNORMAL LOW (ref 12.0–15.0)
Hemoglobin: 7.4 g/dL — ABNORMAL LOW (ref 12.0–15.0)
MCH: 30.4 pg (ref 26.0–34.0)
MCH: 30.7 pg (ref 26.0–34.0)
MCH: 30.7 pg (ref 26.0–34.0)
MCH: 29.8 pg (ref 26.0–34.0)
MCHC: 34.5 g/dL (ref 30.0–36.0)
MCHC: 35.1 g/dL (ref 30.0–36.0)
MCHC: 34.8 g/dL (ref 30.0–36.0)
MCHC: 32.9 g/dL (ref 30.0–36.0)
MCV: 88.2 fL (ref 80.0–100.0)
MCV: 87.4 fL (ref 80.0–100.0)
MCV: 88.2 fL (ref 80.0–100.0)
MCV: 90.7 fL (ref 80.0–100.0)
Platelets: 104 10*3/uL — ABNORMAL LOW (ref 150–400)
Platelets: 99 10*3/uL — ABNORMAL LOW (ref 150–400)
Platelets: 100 10*3/uL — ABNORMAL LOW (ref 150–400)
Platelets: 96 10*3/uL — ABNORMAL LOW (ref 150–400)
RBC: 2.63 MIL/uL — ABNORMAL LOW (ref 3.87–5.11)
RBC: 2.61 MIL/uL — ABNORMAL LOW (ref 3.87–5.11)
RBC: 2.54 MIL/uL — ABNORMAL LOW (ref 3.87–5.11)
RBC: 2.48 MIL/uL — ABNORMAL LOW (ref 3.87–5.11)
RDW: 14.6 % (ref 11.5–15.5)
RDW: 14.6 % (ref 11.5–15.5)
RDW: 14.9 % (ref 11.5–15.5)
RDW: 15.5 % (ref 11.5–15.5)
WBC: 23.6 10*3/uL — ABNORMAL HIGH (ref 4.0–10.5)
WBC: 22.2 10*3/uL — ABNORMAL HIGH (ref 4.0–10.5)
WBC: 20.7 10*3/uL — ABNORMAL HIGH (ref 4.0–10.5)
WBC: 18.8 10*3/uL — ABNORMAL HIGH (ref 4.0–10.5)
nRBC: 17 % — ABNORMAL HIGH (ref 0.0–0.2)
nRBC: 11.7 % — ABNORMAL HIGH (ref 0.0–0.2)
nRBC: 9.1 % — ABNORMAL HIGH (ref 0.0–0.2)
nRBC: 9 % — ABNORMAL HIGH (ref 0.0–0.2)

## 2019-03-30 LAB — BPAM FFP
Blood Product Expiration Date: 202012232359
Blood Product Expiration Date: 202012232359
ISSUE DATE / TIME: 202012182131
ISSUE DATE / TIME: 202012182131
Unit Type and Rh: 5100
Unit Type and Rh: 5100

## 2019-03-30 LAB — PREPARE FRESH FROZEN PLASMA
Unit division: 0
Unit division: 0

## 2019-03-30 LAB — GLUCOSE, CAPILLARY
Glucose-Capillary: 107 mg/dL — ABNORMAL HIGH (ref 70–99)
Glucose-Capillary: 110 mg/dL — ABNORMAL HIGH (ref 70–99)
Glucose-Capillary: 112 mg/dL — ABNORMAL HIGH (ref 70–99)
Glucose-Capillary: 117 mg/dL — ABNORMAL HIGH (ref 70–99)
Glucose-Capillary: 118 mg/dL — ABNORMAL HIGH (ref 70–99)
Glucose-Capillary: 119 mg/dL — ABNORMAL HIGH (ref 70–99)
Glucose-Capillary: 139 mg/dL — ABNORMAL HIGH (ref 70–99)
Glucose-Capillary: 96 mg/dL (ref 70–99)

## 2019-03-30 LAB — ABO/RH: ABO/RH(D): O POS

## 2019-03-30 LAB — BASIC METABOLIC PANEL
Anion gap: 11 (ref 5–15)
BUN: 67 mg/dL — ABNORMAL HIGH (ref 6–20)
CO2: 23 mmol/L (ref 22–32)
Calcium: 8.3 mg/dL — ABNORMAL LOW (ref 8.9–10.3)
Chloride: 110 mmol/L (ref 98–111)
Creatinine, Ser: 1.35 mg/dL — ABNORMAL HIGH (ref 0.44–1.00)
GFR calc Af Amer: 50 mL/min — ABNORMAL LOW (ref >60)
GFR calc non Af Amer: 43 mL/min — ABNORMAL LOW (ref >60)
Glucose, Bld: 117 mg/dL — ABNORMAL HIGH (ref 70–99)
Potassium: 3.4 mmol/L — ABNORMAL LOW (ref 3.5–5.1)
Sodium: 144 mmol/L (ref 135–145)

## 2019-03-30 LAB — PREPARE PLATELET PHERESIS: Unit division: 0

## 2019-03-30 LAB — BPAM PLATELET PHERESIS
Blood Product Expiration Date: 202012212359
ISSUE DATE / TIME: 202012182136
Unit Type and Rh: 6200

## 2019-03-30 LAB — MAGNESIUM: Magnesium: 2.4 mg/dL (ref 1.7–2.4)

## 2019-03-30 MED ORDER — LEVOTHYROXINE SODIUM 100 MCG PO TABS
100.0000 ug | ORAL_TABLET | Freq: Every day | ORAL | Status: DC
  Administered 2019-03-31 – 2019-04-15 (×16): 100 ug
  Filled 2019-03-30 (×17): qty 1

## 2019-03-30 MED ORDER — SODIUM CHLORIDE 0.9 % IV SOLN
1500.0000 mg | Freq: Once | INTRAVENOUS | Status: DC
  Filled 2019-03-30: qty 30

## 2019-03-30 MED ORDER — WHITE PETROLATUM EX OINT
TOPICAL_OINTMENT | CUTANEOUS | Status: DC | PRN
  Filled 2019-03-30: qty 28.35

## 2019-03-30 MED ORDER — WHITE PETROLATUM EX OINT
TOPICAL_OINTMENT | CUTANEOUS | Status: AC
  Administered 2019-03-30: 0.2
  Filled 2019-03-30: qty 28.35

## 2019-03-30 MED ORDER — ACETAMINOPHEN 160 MG/5ML PO SOLN
650.0000 mg | Freq: Four times a day (QID) | ORAL | Status: DC | PRN
  Administered 2019-03-31 – 2019-04-09 (×10): 650 mg
  Filled 2019-03-30 (×11): qty 20.3

## 2019-03-30 MED ORDER — GUAIFENESIN 100 MG/5ML PO SOLN
5.0000 mL | Freq: Two times a day (BID) | ORAL | Status: DC
  Administered 2019-03-30 – 2019-04-14 (×31): 100 mg
  Filled 2019-03-30 (×3): qty 5
  Filled 2019-03-30: qty 10
  Filled 2019-03-30 (×8): qty 5
  Filled 2019-03-30: qty 10
  Filled 2019-03-30: qty 5
  Filled 2019-03-30 (×2): qty 10
  Filled 2019-03-30 (×7): qty 5
  Filled 2019-03-30: qty 10
  Filled 2019-03-30: qty 5
  Filled 2019-03-30: qty 10
  Filled 2019-03-30 (×8): qty 5

## 2019-03-30 MED ORDER — ACETAMINOPHEN 160 MG/5ML PO SOLN
650.0000 mg | Freq: Four times a day (QID) | ORAL | Status: DC | PRN
  Administered 2019-03-30: 650 mg via ORAL
  Filled 2019-03-30: qty 20.3

## 2019-03-30 MED ORDER — VITAL HIGH PROTEIN PO LIQD
1000.0000 mL | ORAL | Status: DC
  Administered 2019-03-30 – 2019-03-31 (×2): 1000 mL

## 2019-03-30 NOTE — Progress Notes (Signed)
Pt's hemoglobin resulted 10.4 prior to starting PRBCx2, discussed with Dr. Oletta Darter w/ [facility], PRBC order D/C at this time.

## 2019-03-30 NOTE — Progress Notes (Signed)
eLink Physician-Brief Progress Note Patient Name: Rebecca Bruce DOB: 04-09-1961 MRN: [id number]   Date of Service  03/30/2019  HPI/Events of Note  Patient only has CBC ordered for AM lab.   eICU Interventions  Will order: 1. BMP and Mg++ level at 5 AM.      Intervention Category Major Interventions: Other:  Tatsuo Musial Cornelia Copa 03/30/2019, 3:45 AM

## 2019-03-30 NOTE — Progress Notes (Signed)
Patient ID: Rebecca Bruce, female   DOB: 03-17-1961, 58 y.o.   MRN: [id number]       Subjective: Pt without significant interaction.  Transferred to [facility].  rec'd angiogram last night.  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    ROS: unable  Objective: Vital signs in last 24 hours: Temp:  [97.7 F (36.5 C)-99 F (37.2 C)] 98.4 F (36.9 C) (12/19 0727) Pulse Rate:  [30-152] 67 (12/19 0600) Resp:  [17-41] 19 (12/19 0600) BP: (54-190)/(33-147) 144/88 (12/19 0600) SpO2:  [93 %-100 %] 96 % (12/19 0600) Arterial Line BP: (83-153)/(69-92) 111/89 (12/19 0600) Last BM Date: 03/28/19  Intake/Output from previous day: 12/18 0701 - 12/19 0700 In: 5534.2 [I.V.:2500.6; Blood:1833.7; NG/GT:200; IV Piggyback:1000] Out: 725 [Urine:725] Intake/Output this shift: No intake/output data recorded.  PE: Gen: alert, tracks me with eyes, but not interactive.   Heart: mildly tachy Lungs: clear but with upper airway secretions Abd: soft, sl distended, does not appear tender.   Psych: unable  Lab Results:  Recent Labs    03/29/19 2042 03/30/19 0357  WBC 31.0* 23.6*  HGB 10.4* 8.0*  HCT 29.2* 23.2*  PLT 84* 104*   BMET Recent Labs    03/29/19 1015 03/30/19 0357  NA 143 144  K 4.1 3.4*  CL 111 110  CO2 17* 23  GLUCOSE 274* 117*  BUN 79* 67*  CREATININE 1.47* 1.35*  CALCIUM 7.7* 8.3*   PT/INR Recent Labs    03/28/19 1100  LABPROT 15.7*  INR 1.3*   CMP     Component Value Date/Time   NA 144 03/30/2019 0357   NA 144 02/03/2016 0000   K 3.4 (L) 03/30/2019 0357   CL 110 03/30/2019 0357   CO2 23 03/30/2019 0357   GLUCOSE 117 (H) 03/30/2019 0357   BUN 67 (H) 03/30/2019 0357   BUN 11 02/03/2016 0000   CREATININE 1.35 (H) 03/30/2019 0357   CREATININE 1.00 07/13/2018 1023   CALCIUM 8.3 (L) 03/30/2019 0357   PROT 4.8 (L) 03/29/2019 1015   PROT 7.0 01/09/2015 0809   ALBUMIN 2.2 (L) 03/29/2019 1015   ALBUMIN 4.0 01/09/2015 0809   AST 212 (H) 03/29/2019 1015   ALT 1,030 (H)  03/29/2019 1015   ALKPHOS 68 03/29/2019 1015   BILITOT 1.1 03/29/2019 1015   BILITOT 0.7 01/09/2015 0809   GFRNONAA 43 (L) 03/30/2019 0357   GFRNONAA 63 07/13/2018 1023   GFRAA 50 (L) 03/30/2019 0357   GFRAA 73 07/13/2018 1023   Lipase  No results found for: LIPASE     Studies/Results: CT ABDOMEN PELVIS WO CONTRAST  Result Date: 03/28/2019 CLINICAL DATA:  Inpatient.  Given 19 pneumonia.  Dyspnea.  Anemia. EXAM: CT CHEST, ABDOMEN AND PELVIS WITHOUT CONTRAST TECHNIQUE: Multidetector CT imaging of the chest, abdomen and pelvis was performed following the standard protocol without IV contrast. COMPARISON:  03/24/2019 chest radiograph. FINDINGS: CT CHEST FINDINGS Motion degraded scan limits assessment. Cardiovascular: Top-normal heart size. No significant pericardial effusion/thickening. Left anterior descending coronary atherosclerosis. Mildly atherosclerotic nonaneurysmal thoracic aorta. Normal caliber pulmonary arteries. Mediastinum/Nodes: No discrete thyroid nodules. Enteric tube terminates in body of the stomach. No pathologically enlarged axillary, mediastinal or hilar lymph nodes, noting limited sensitivity for the detection of hilar adenopathy on this noncontrast study. Lungs/Pleura: No pneumothorax. No pleural effusion. Moderate patchy consolidation and ground-glass opacity throughout both lungs, with a ground-glass opacities most prominent in the upper lobes and with the consolidation most prominent in the dependent lower lobes. Posterior left upper lobe 6 mm pulmonary  nodule (series 4/image 19). No lung masses or additional significant pulmonary nodules on this motion degraded scan. Musculoskeletal: No aggressive appearing focal osseous lesions. Mild thoracic spondylosis. CT ABDOMEN PELVIS FINDINGS Hepatobiliary: Normal liver with no liver mass. Cholelithiasis. No gallbladder wall thickening or pericholecystic fluid. No biliary ductal dilatation. Pancreas: Normal, with no mass or duct  dilation. Spleen: Normal size. No mass. Adrenals/Urinary Tract: Normal adrenals. No renal stones. No hydronephrosis. Simple 2.5 cm medial upper left renal cyst. No additional contour deforming renal lesions. Foley catheter terminates in the bladder. Tiny foci of nondependent bladder gas compatible with instrumentation. Otherwise normal bladder. Stomach/Bowel: Enteric tube loops in the gastric fundus with the tip in the body of the stomach. Stomach is nondistended and is unremarkable. Normal caliber small bowel with no small bowel wall thickening. Candidate normal appendix in the right lower quadrant. There is a large hemorrhagic 13.9 x 8.7 cm left upper quadrant mass (series 2/image 52) centered in the splenic flexure of the colon with heterogeneous hyperdensity and surrounding extensive fat stranding. Otherwise unremarkable nondistended large bowel. Vascular/Lymphatic: Mildly atherosclerotic nonaneurysmal abdominal aorta. No pathologically enlarged lymph nodes in the abdomen or pelvis. Reproductive: There is a coarse 3.7 cm right pelvic calcification (series 2/image 100), presumably a degenerated calcified fibroid, poorly delineated on this noncontrast scan. No discrete adnexal masses. Other: No pneumoperitoneum. Moderate volume hemoperitoneum, most prominent in the pelvis. No focal fluid collections. Mild body wall anasarca. Musculoskeletal: No aggressive appearing focal osseous lesions. IMPRESSION: 1. Large 13.9 x 8.7 cm hemorrhagic left upper quadrant mass centered in the splenic flexure of the colon with moderate volume hemoperitoneum, most prominent in the pelvis. Acute hemorrhage arising from an underlying colonic neoplasm not excluded. 2. No free air.  No findings of bowel obstruction. 3. Moderate patchy ground-glass opacity and consolidation in both lungs, with an appearance compatible with COVID-19 pneumonia. 4. Left upper lobe 6 mm solid pulmonary nodule. Non-contrast chest CT at 6-12 months is  recommended. If the nodule is stable at time of repeat CT, then future CT at 18-24 months (from today's scan) is considered optional for low-risk patients, but is recommended for high-risk patients. This recommendation follows the consensus statement: Guidelines for Management of Incidental Pulmonary Nodules Detected on CT Images:From the Fleischner Society 2017; published online before print (10.1148/radiol.SG:5268862). 5. One vessel coronary atherosclerosis. 6. Cholelithiasis. 7. Enteric tube terminates in the body of the stomach. 8.  Aortic Atherosclerosis (ICD10-I70.0). Critical Value/emergent results were called by telephone at the time of interpretation on 03/28/2019 at 10:31 am to provider DR. BRENT MCQUAID, who verbally acknowledged these results. Electronically Signed   By: Ilona Sorrel M.D.   On: 03/28/2019 10:32   CT HEAD WO CONTRAST  Result Date: 03/28/2019 CLINICAL DATA:  Encephalopathy EXAM: CT HEAD WITHOUT CONTRAST TECHNIQUE: Contiguous axial images were obtained from the base of the skull through the vertex without intravenous contrast. COMPARISON:  03/09/2019 FINDINGS: Brain: Study limited by motion artifact. No signs of intracranial hemorrhage, mass, mass effect, midline shift or hydrocephalus. Vascular: No hyperdense vessel or unexpected calcification. Skull: Normal. Negative for fracture or focal lesion. Sinuses/Orbits: No acute finding. Other: None. IMPRESSION: Study limited by motion artifact. No acute intracranial abnormality. Electronically Signed   By: Zetta Bills M.D.   On: 03/28/2019 10:01   CT CHEST WO CONTRAST  Result Date: 03/28/2019 CLINICAL DATA:  Inpatient.  Given 19 pneumonia.  Dyspnea.  Anemia. EXAM: CT CHEST, ABDOMEN AND PELVIS WITHOUT CONTRAST TECHNIQUE: Multidetector CT imaging of the chest, abdomen and  pelvis was performed following the standard protocol without IV contrast. COMPARISON:  03/24/2019 chest radiograph. FINDINGS: CT CHEST FINDINGS Motion degraded scan  limits assessment. Cardiovascular: Top-normal heart size. No significant pericardial effusion/thickening. Left anterior descending coronary atherosclerosis. Mildly atherosclerotic nonaneurysmal thoracic aorta. Normal caliber pulmonary arteries. Mediastinum/Nodes: No discrete thyroid nodules. Enteric tube terminates in body of the stomach. No pathologically enlarged axillary, mediastinal or hilar lymph nodes, noting limited sensitivity for the detection of hilar adenopathy on this noncontrast study. Lungs/Pleura: No pneumothorax. No pleural effusion. Moderate patchy consolidation and ground-glass opacity throughout both lungs, with a ground-glass opacities most prominent in the upper lobes and with the consolidation most prominent in the dependent lower lobes. Posterior left upper lobe 6 mm pulmonary nodule (series 4/image 19). No lung masses or additional significant pulmonary nodules on this motion degraded scan. Musculoskeletal: No aggressive appearing focal osseous lesions. Mild thoracic spondylosis. CT ABDOMEN PELVIS FINDINGS Hepatobiliary: Normal liver with no liver mass. Cholelithiasis. No gallbladder wall thickening or pericholecystic fluid. No biliary ductal dilatation. Pancreas: Normal, with no mass or duct dilation. Spleen: Normal size. No mass. Adrenals/Urinary Tract: Normal adrenals. No renal stones. No hydronephrosis. Simple 2.5 cm medial upper left renal cyst. No additional contour deforming renal lesions. Foley catheter terminates in the bladder. Tiny foci of nondependent bladder gas compatible with instrumentation. Otherwise normal bladder. Stomach/Bowel: Enteric tube loops in the gastric fundus with the tip in the body of the stomach. Stomach is nondistended and is unremarkable. Normal caliber small bowel with no small bowel wall thickening. Candidate normal appendix in the right lower quadrant. There is a large hemorrhagic 13.9 x 8.7 cm left upper quadrant mass (series 2/image 52) centered in the  splenic flexure of the colon with heterogeneous hyperdensity and surrounding extensive fat stranding. Otherwise unremarkable nondistended large bowel. Vascular/Lymphatic: Mildly atherosclerotic nonaneurysmal abdominal aorta. No pathologically enlarged lymph nodes in the abdomen or pelvis. Reproductive: There is a coarse 3.7 cm right pelvic calcification (series 2/image 100), presumably a degenerated calcified fibroid, poorly delineated on this noncontrast scan. No discrete adnexal masses. Other: No pneumoperitoneum. Moderate volume hemoperitoneum, most prominent in the pelvis. No focal fluid collections. Mild body wall anasarca. Musculoskeletal: No aggressive appearing focal osseous lesions. IMPRESSION: 1. Large 13.9 x 8.7 cm hemorrhagic left upper quadrant mass centered in the splenic flexure of the colon with moderate volume hemoperitoneum, most prominent in the pelvis. Acute hemorrhage arising from an underlying colonic neoplasm not excluded. 2. No free air.  No findings of bowel obstruction. 3. Moderate patchy ground-glass opacity and consolidation in both lungs, with an appearance compatible with COVID-19 pneumonia. 4. Left upper lobe 6 mm solid pulmonary nodule. Non-contrast chest CT at 6-12 months is recommended. If the nodule is stable at time of repeat CT, then future CT at 18-24 months (from today's scan) is considered optional for low-risk patients, but is recommended for high-risk patients. This recommendation follows the consensus statement: Guidelines for Management of Incidental Pulmonary Nodules Detected on CT Images:From the Fleischner Society 2017; published online before print (10.1148/radiol.IJ:2314499). 5. One vessel coronary atherosclerosis. 6. Cholelithiasis. 7. Enteric tube terminates in the body of the stomach. 8.  Aortic Atherosclerosis (ICD10-I70.0). Critical Value/emergent results were called by telephone at the time of interpretation on 03/28/2019 at 10:31 am to provider DR. BRENT  MCQUAID, who verbally acknowledged these results. Electronically Signed   By: Ilona Sorrel M.D.   On: 03/28/2019 10:32   DG CHEST PORT 1 VIEW  Result Date: 03/29/2019 CLINICAL DATA:  Central line placement. EXAM:  PORTABLE CHEST 1 VIEW COMPARISON:  March 24, 2019. FINDINGS: Stable cardiomediastinal silhouette. Feeding tube is seen with distal tip in expected position of stomach. Right internal jugular catheter is noted with distal tip in expected position of the SVC. Hypoinflation of the lungs is noted. No pneumothorax or pleural effusion is noted. Lungs are clear. Bony thorax is unremarkable. IMPRESSION: Right internal jugular catheter is noted with tip in expected position of the SVC. Distal tip of feeding tube is seen within the stomach. Hypoinflation of the lungs is noted. Electronically Signed   By: Marijo Conception M.D.   On: 03/29/2019 12:42   CT Angio Abd/Pel w/ and/or w/o  Result Date: 03/29/2019 CLINICAL DATA:  58 year old female with a history abdominal hemorrhage EXAM: CTA ABDOMEN AND PELVIS WITHOUT AND WITH CONTRAST TECHNIQUE: Multidetector CT imaging of the abdomen and pelvis was performed using the standard protocol during bolus administration of intravenous contrast. Multiplanar reconstructed images and MIPs were obtained and reviewed to evaluate the vascular anatomy. CONTRAST:  145mL OMNIPAQUE IOHEXOL 350 MG/ML SOLN COMPARISON:  03/28/2019 FINDINGS: VASCULAR Aorta: Unremarkable course, caliber, contour of the abdominal aorta. No dissection, aneurysm, or periaortic fluid. Celiac: No significant atherosclerotic changes at the celiac artery origin. Small caliber celiac artery. Small caliber left gastric artery splenic artery hepatic artery. The arterial phase series 4 demonstrates linear hyperdensity in the distribution the GDA/pancreaticoduodenal arteries on image 44. This does persist in the delayed phase images, potentially representing extravasation. All of the arteries in the  distribution of the GDA are small caliber compatible with vasospasm. The splenic artery is small caliber compatible with vaso spasm. SMA: No significant atherosclerotic changes at the SMA origin. SMA branches are small caliber. Renals: No significant atherosclerotic changes at the origin renal arteries which remain patent. IMA: Inferior mesenteric artery is patent. Right lower extremity: Unremarkable course, caliber, and contour of the right iliac system. No aneurysm, dissection, or occlusion. Hypogastric artery is patent. Common femoral artery patent. Proximal SFA and profunda femoris patent. Left lower extremity: Unremarkable course, caliber, and contour of the left iliac system. No aneurysm, dissection, or occlusion. Hypogastric artery is patent. Common femoral artery patent. Proximal SFA and profunda femoris patent. Veins: Unremarkable appearance of the venous system. Review of the MIP images confirms the above findings. NON-VASCULAR Lower chest: Atelectasis at the lung bases. Hepatobiliary: Unremarkable appearance of the liver. Hyperdense material layered in the dependent aspect of the gallbladder. No pericholecystic inflammatory changes. Pancreas: Pancreatic tail body unremarkable. Evaluation the pancreatic head somewhat limited by the presence of hematoma/stranding. Spleen: The spleen diameter within normal limits, best seen on the delayed images adjacent to the diaphragm. Adrenals/Urinary Tract: Unremarkable appearance of the adrenal glands. Right: No hydronephrosis. Symmetric perfusion to the left. No nephrolithiasis. Unremarkable course of the right ureter. Hypodense lesion on the lateral cortex of the right kidney incompletely characterized. Left: No hydronephrosis. Symmetric perfusion to the right. No nephrolithiasis. Unremarkable course of the left ureter. Hypodense cystic lesions associated with the left kidney at the anterior cortex and the medial cortex, most likely cysts. Balloon retention urinary  catheter. Stomach/Bowel: Enteric feeding tube terminates within the stomach. Small bowel decompressed, with enhancement maintained of the small bowel wall. Appendix is not visualized, however, no inflammatory changes are present adjacent to the cecum to indicate an appendicitis. No significant stool burden. Colonic diverticula. The splenic flexure is not visualized given the presence of hematoma. The colon wall at this site is not well evaluated. Lymphatic: No adenopathy. Mesenteric: Redemonstration left upper quadrant  hemorrhage, centered in the mesenteric fat, inseparable from the splenic flexure. The greatest component of the hemorrhage measures 9 point 4 cm x 5.8 cm, relatively similar in configuration to the comparison. Hemorrhage extends inferiorly into the fat of the left-sided mesentery. Redemonstration of pelvic hematoma. Relatively hyperdense tissue/hematoma overlying the uterus. Trace volume of free fluid within the small bowel loops. Free fluid within the right upper quadrant adjacent to the liver, measuring 25 Hounsfield units. Redemonstration of intermediate density tissue/fluid at the pancreatic head, inseparable from the first portion the duodenum. Hyperdense linear streak at the inferior aspect, image 44 of series 4, not present on the delayed images. Reproductive: Fibroid uterus. Other: No hernia. Musculoskeletal: Degenerative changes of the spine. No acute displaced fracture IMPRESSION: Multifocal mesenteric hemorrhage again demonstrated, with free intraperitoneal hemorrhage, including the mesenteric fat of the left upper quadrant adjacent to the splenic flexure, as well hemorrhage centered at the pancreatic head. Questionable extravasation of contrast in the region the pancreatic head hematoma, potentially arising from branches of the GDA and/or pancreaticoduodenal arcade. No extravasation of contrast identified within the left upper quadrant, with no source identified in the left upper quadrant.  Evidence of diffuse vasospasm. Given the presence of hematoma and edema/stranding near the pancreatic head, acute pancreatitis cannot be excluded. Preliminary results were discussed by telephone at the time of interpretation on 03/29/2019 at 3:35 pm to [place], Signed, Dulcy Fanny. Dellia Nims, RPVI [facility] Electronically Signed   By: Corrie Mckusick D.O.   On: 03/29/2019 15:37    Anti-infectives: Anti-infectives (From admission, onward)   Start     Dose/Rate Route Frequency Ordered Stop   03/15/19 1700  metroNIDAZOLE (FLAGYL) IVPB 500 mg  Status:  Discontinued     500 mg 100 mL/hr over 60 Minutes Intravenous Every 8 hours 03/15/19 1655 03/19/19 0935   03/14/19 0600  vancomycin (VANCOCIN) IVPB 1000 mg/200 mL premix     1,000 mg 200 mL/hr over 60 Minutes Intravenous Every 12 hours 03/13/19 1618 03/18/19 1909   03/13/19 2200  cefTRIAXone (ROCEPHIN) 2 g in sodium chloride 0.9 % 100 mL IVPB     2 g 200 mL/hr over 30 Minutes Intravenous Every 12 hours 03/13/19 1618 03/18/19 2340   03/13/19 1630  vancomycin (VANCOCIN) 2,000 mg in sodium chloride 0.9 % 500 mL IVPB     2,000 mg 250 mL/hr over 120 Minutes Intravenous  Once 03/13/19 1618 03/14/19 0700   03/12/19 1800  vancomycin (VANCOCIN) 1,750 mg in sodium chloride 0.9 % 500 mL IVPB  Status:  Discontinued     1,750 mg 250 mL/hr over 120 Minutes Intravenous Every 24 hours 03/11/19 1938 03/12/19 0903   03/12/19 1000  remdesivir 100 mg in sodium chloride 0.9 % 250 mL IVPB     100 mg 500 mL/hr over 30 Minutes Intravenous Every 24 hours 03/11/19 1943 03/14/19 1055   03/11/19 2200  meropenem (MERREM) 1 g in sodium chloride 0.9 % 100 mL IVPB  Status:  Discontinued     1 g 200 mL/hr over 30 Minutes Intravenous Every 8 hours 03/11/19 1938 03/13/19 1604       Assessment/Plan H/O HTN COVID - per medicine, just extubated on 12/14 after intubation for a couple of weeks Acute encephalopathy  with catatonia - cortrak in place for feeding.   AKI - improving.   ABL anemia - secondary to below, transfuse as needed.  INR normal Transaminitis   Hemorrhagic shock secondary to intraperitoneal process  Continues to drop HCT.   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  Cannot fix this process with operative intervention.  FEN - ok to resume tube feeds if deemed appropriate per CCM as she is not surgical candidate. VTE - on hold due to bleeding.   ID - none currently   LOS: 19 days   Milus Height, MD FACS Surgical Oncology, General Surgery, Trauma and [place] [facility], [place] for weekday/non holidays Check [email or web address] for coverage night/weekend/holidays  Do not use SecureChat as we are not always in the epic system to get the message.  Also, we may be off.  It is not reliable for patient care.

## 2019-03-30 NOTE — Progress Notes (Signed)
Reason for consult: Catatonia  Subjective: Patient extubated on 12/14.  Transferred to [facility] for emergent IR after acutely dropping hemoglobin from 11.6 down to 3.9 with CT abdomen pelvis showing large new hemoperitoneum.  A   ROS:  Unable to obtain due to poor mental status  Examination  Vital signs in last 24 hours: Temp:  [98.2 F (36.8 C)-99.5 F (37.5 C)] 99.5 F (37.5 C) (12/19 1627) Pulse Rate:  [34-89] 71 (12/19 1900) Resp:  [17-33] 17 (12/19 1900) BP: (118-166)/(68-94) 135/77 (12/19 1900) SpO2:  [93 %-100 %] 100 % (12/19 1900) Arterial Line BP: (111-153)/(72-125) 126/115 (12/19 1200)  General: lying in bed CVS: pulse-normal rate and rhythm RS: breathing comfortably Extremities: normal   Neuro: MS: Alert, follows only simple commands such as closing eyes, moans occasionally but is nonverbal CN: pupils equal and reactive, unable to assess extraocular movement but no forced gaze deviation, face symmetric, tongue midline Motor: Minimal spontaneous movement in all 4 extremities, withdraws against gravity in upper extremities to pain, increased tone/leadpipe rigidity in all 4 extremities. Reflexes: Absent bilateral biceps and patella   Basic Metabolic Panel: Recent Labs  Lab 03/25/19 0340 03/26/19 0705 03/28/19 0655 03/29/19 1015 03/30/19 0357  NA 136 137 140 143 144  K 3.8 5.1 3.4* 4.1 3.4*  CL 101 105 108 111 110  CO2 28 21* 21* 17* 23  GLUCOSE 131* 112* 244* 274* 117*  BUN 19 26* 73* 79* 67*  CREATININE 0.40* 0.87 1.41* 1.47* 1.35*  CALCIUM 8.3* 7.8* 7.5* 7.7* 8.3*  MG  --   --  2.3  --  2.4    CBC: Recent Labs  Lab 03/29/19 1015 03/29/19 2042 03/30/19 0357 03/30/19 1028 03/30/19 1547  WBC 27.8* 31.0* 23.6* 22.2* 20.7*  HGB 6.1* 10.4* 8.0* 8.0* 7.8*  HCT 17.5* 29.2* 23.2* 22.8* 22.4*  MCV 86.6 86.9 88.2 87.4 88.2  PLT 138* 84* 104* 99* 100*     Coagulation Studies: Recent Labs    03/28/19 1100  LABPROT 15.7*  INR 1.3*      ASSESSMENT AND PLAN  58 year old female with past medical history of mental retardation( living group nursing home but otherwise active and communicative), hypothyroidism, hypertension who developed catatonia while admitted for COVID-19 infection during [facility].  She was intubated for airway protection and continued to receive benzodiazepines for presumed catatonia.  Paxil was discontinued due to some concern for serotonin syndrome.  She was also treated with antibiotics for CNS coverage for possible meningitis.  Patient underwent EEG, MRI, LP for work-up which were all negative.  Anti Gad antibodies were sent to evaluate for stiff person syndrome.  Patient transferred to [facility] after having acute blood loss anemia secondary to hemoperitoneum, angiogram revealing Covid related vasculitis.  On reassessing patient today to see if her catatonia has improved, continues to be severely rigid throughout.  Catatonia/Muscle Rigidity  Work-up so far included  MRI brain w/wo contrast which was normal on 12/3 EEG showed severe diffuse encephalopathy 12/3 and 12/8 LP was unremarkable mildly elevated protein of 57, no oligoclonal bands.   Anti GAD antibodies also negative.  Recommendation Consider Repeat MRI brain, C spine to look for new infarcts Consider performing CTA head and neck to look for CNS vasculitis  Will discuss with CCM team tomorrow if patient stable for studies.   I spent 35 min in the care of this patient including reviewing chart, labs, studies and examination of this patient.   Rebecca Bruce [facility] [employer]/support/administrative officer Number  [phone] For questions after 7pm please refer to AMION to reach the Neurologist on call

## 2019-03-30 NOTE — Progress Notes (Signed)
NAME:  Rebecca Bruce, MRN:  [id number], DOB:  1961/01/07, LOS: 36 ADMISSION DATE:  03/11/2019, CONSULTATION DATE:  12/4 REFERRING MD:  Sloan Leiter, CHIEF COMPLAINT:  Inability to protect airway   Brief History   58 y/o female brought to [facility] on 11/28 in the setting of altered mental status from her group home.  She was found to have COVID pneumonia, treated with decadron and remdesivir then sent to [facility] on 11/30.  Here has had progressive increase in acute encephalopathy and has become less and less responsive.  12/4 PCCM asked to see her because she reached the point that she was aspirating and not responding to pharyngeal/tracheal suctioning.  By report her exam findings have waxed and waned over the last three days but have not generally moved in the right direction.  Neurology saw her on 12/2 and apparently at that time she followed some simple commands, but she was non-verbal at the time with significant rigidity noted in her upper extremities.   At baseline lives in a group home, can bathe herself and have a conversation.  Has baseline anxiety and depression.  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   She is transferred from [place] long 12/18 due to finding of hemoperitoneum for emergent angiogram  Past Medical History  Thyroid disease Hypertension GERD Anxiety  Depression [facility] Events   11/28 Admission [facility] 11/30 Transfer to [facility] 12/04 Intubation for inability to protect airway 12/05 Diprivan, fentanyl gtt. Suctioned large plug by RT >> Peak pressure improved.  Tol SBT 12/12 Tx to [place]. Sedation decreased but put back on fentanyl drip overnight for tachycardia.  Failed SBT 12/13 PSV wean, mental status barrier to extubation 12/14: Extubated.  During the evening hours patient hypotensive.  This was after receiving propranolol placed on Neo-Synephrine and administered  albumin 12/15: phenylephrine doses increasing.  12/16: pressor demands down after volume, stress steroids, and art line placement.  12/17: Hemoglobin acutely dropped from 11.6 down to 3.9, received 2 units of blood, CT abdomen pelvis showing large new hemoperitoneum involving the left upper quadrant of the abdomen near the splenic flexure of the colon etiology was not clear.  All anticoagulation was discontinued. 12/18: Received another unit of blood overnight, hemoglobin had gone from 7.4 down to 6.9, with no bump following transfusion.  General surgery consulted.  Interventional radiology also consulted.  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  R Micro Data:  11/27 urine culture > multiple species 11/28 blood > negative 11/28 SARS COV 2 > positive 12/04 sputum > normal flora  Antimicrobials:  11/28 Vanc > 11/30, restart 12/2 > 12/7 11/28 mero >  12/2 11/29 remdesivir > 12/3 12/02 ceftriaxone > 12/7 12/04 flagyl > off  Interim history/subjective:  Nonverbal Afebrile No obvious pain or distress On room air   Objective   Blood pressure Marland Kitchen)  166/91, pulse 80, temperature 99.1 F (37.3 C), temperature source Axillary, resp. rate (!) 25, height 5\' 2"  (1.575 m), weight 87.9 kg, SpO2 98 %.        Intake/Output Summary (Last 24 hours) at 03/30/2019 1301 Last data filed at 03/30/2019 1200 Gross per 24 hour  Intake 4734.22  ml  Output 905 ml  Net 3829.22 ml   Filed Weights   03/22/19 0500 03/24/19 0449 03/25/19 0223  Weight: 88.8 kg 88.9 kg 87.9 kg    Examination: General: Sitting up in bed, no distress HEENT normocephalic pale mucous membranes no JVD Pulmonary decreased breath sounds, no rhonchi Cardiac: Regular rate and rhythm Abdomen: Soft, mild distended, no guarding or tenderness noted positive bowel sounds Extremities: Warm and dry Neuro: Eyes open, nonverbal , continuous movements GU: Untreated yellow  Resolved Hospital Problem list   Hypotension  Acute hypoxic respiratory failure Assessment & Plan:   Hemorrhagic shock with acute blood loss anemia w/ spontaneous hemoperitoneum.   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 Has been evaluated by neurology. Only workup remaining is for stiff person sydrome and anti-GAD antibodies are pending  Plan Supportive care Avoid sedating medications  Relative adrenal dysfunction Plan Dc Stress dose steroids  Hypothyroidism Plan Synthroid  Pulmonary nodule.  Left upper lobe 6 mm in size Plan/recommendation CT 6 to 12 months  Best practice:  Diet: tube feeding DVT prophylaxis: SCDs GI prophylaxis: famotidine Mobility:OOB Code Status: full Disposition: ICU  Family: [name])   Family indicates she is "frequently afraid and will sit staring off into space and not answer".  [name] indicates we should try to tell her that "[name] is coming to get her and she needs to get up".     Transfer to telemetry and to [facility] 12/20  Kara Mead MD. Shade Flood. [facility]  If no response to pager , please call [phone]   03/30/2019

## 2019-03-31 ENCOUNTER — Inpatient Hospital Stay (HOSPITAL_COMMUNITY): Payer: Medicare Other

## 2019-03-31 DIAGNOSIS — J9602 Acute respiratory failure with hypercapnia: Secondary | ICD-10-CM

## 2019-03-31 LAB — COMPREHENSIVE METABOLIC PANEL
ALT: 469 U/L — ABNORMAL HIGH (ref 0–44)
AST: 76 U/L — ABNORMAL HIGH (ref 15–41)
Albumin: 2.6 g/dL — ABNORMAL LOW (ref 3.5–5.0)
Alkaline Phosphatase: 62 U/L (ref 38–126)
Anion gap: 11 (ref 5–15)
BUN: 55 mg/dL — ABNORMAL HIGH (ref 6–20)
CO2: 25 mmol/L (ref 22–32)
Calcium: 8.4 mg/dL — ABNORMAL LOW (ref 8.9–10.3)
Chloride: 113 mmol/L — ABNORMAL HIGH (ref 98–111)
Creatinine, Ser: 1.24 mg/dL — ABNORMAL HIGH (ref 0.44–1.00)
GFR calc Af Amer: 56 mL/min — ABNORMAL LOW (ref >60)
GFR calc non Af Amer: 48 mL/min — ABNORMAL LOW (ref >60)
Glucose, Bld: 125 mg/dL — ABNORMAL HIGH (ref 70–99)
Potassium: 3 mmol/L — ABNORMAL LOW (ref 3.5–5.1)
Sodium: 149 mmol/L — ABNORMAL HIGH (ref 135–145)
Total Bilirubin: 1.9 mg/dL — ABNORMAL HIGH (ref 0.3–1.2)
Total Protein: 5.1 g/dL — ABNORMAL LOW (ref 6.5–8.1)

## 2019-03-31 LAB — TYPE AND SCREEN
ABO/RH(D): O POS
Antibody Screen: NEGATIVE
Unit division: 0
Unit division: 0
Unit division: 0
Unit division: 0
Unit division: 0

## 2019-03-31 LAB — BPAM RBC
Blood Product Expiration Date: 202101142359
Blood Product Expiration Date: 202101152359
Blood Product Expiration Date: 202101152359
Blood Product Expiration Date: 202101182359
Blood Product Expiration Date: 202101192359
ISSUE DATE / TIME: 202012171005
ISSUE DATE / TIME: 202012171005
ISSUE DATE / TIME: 202012180458
ISSUE DATE / TIME: 202012181209
ISSUE DATE / TIME: 202012181209
Unit Type and Rh: 5100
Unit Type and Rh: 5100
Unit Type and Rh: 5100
Unit Type and Rh: 5100
Unit Type and Rh: 5100

## 2019-03-31 LAB — CBC
HCT: 21.7 % — ABNORMAL LOW (ref 36.0–46.0)
Hemoglobin: 7.3 g/dL — ABNORMAL LOW (ref 12.0–15.0)
MCH: 30.7 pg (ref 26.0–34.0)
MCHC: 33.6 g/dL (ref 30.0–36.0)
MCV: 91.2 fL (ref 80.0–100.0)
Platelets: 92 10*3/uL — ABNORMAL LOW (ref 150–400)
RBC: 2.38 MIL/uL — ABNORMAL LOW (ref 3.87–5.11)
RDW: 15.6 % — ABNORMAL HIGH (ref 11.5–15.5)
WBC: 16 10*3/uL — ABNORMAL HIGH (ref 4.0–10.5)
nRBC: 7.8 % — ABNORMAL HIGH (ref 0.0–0.2)

## 2019-03-31 LAB — GLUCOSE, CAPILLARY
Glucose-Capillary: 109 mg/dL — ABNORMAL HIGH (ref 70–99)
Glucose-Capillary: 117 mg/dL — ABNORMAL HIGH (ref 70–99)
Glucose-Capillary: 120 mg/dL — ABNORMAL HIGH (ref 70–99)
Glucose-Capillary: 125 mg/dL — ABNORMAL HIGH (ref 70–99)
Glucose-Capillary: 139 mg/dL — ABNORMAL HIGH (ref 70–99)
Glucose-Capillary: 97 mg/dL (ref 70–99)

## 2019-03-31 IMAGING — CT CT ANGIO NECK
1 of 8 series · 12 of 46 positions shown, 17 images · IV contrast (OMNI)
Comparison: Head CT [DATE] and MRI [DATE].

CLINICAL DATA: Catatonia.  [XW] infection.  Vasculitis.

EXAM:
CT ANGIOGRAPHY HEAD AND NECK
TECHNIQUE: Multidetector CT imaging of the head and neck was performed using
the standard protocol during bolus administration of intravenous
contrast. Multiplanar CT image reconstructions and MIPs were
obtained to evaluate the vascular anatomy. Carotid stenosis
measurements (when applicable) are obtained utilizing NASCET
criteria, using the distal internal carotid diameter as the
denominator.
CONTRAST:  75mL OMNIPAQUE IOHEXOL 350 MG/ML SOLN

[Series 12: thin · axial · 0.50mm/px · z∈[-315,-14]mm · 12 of 688 slices shown, 17 images]
[im 43/688  soft-tissue]
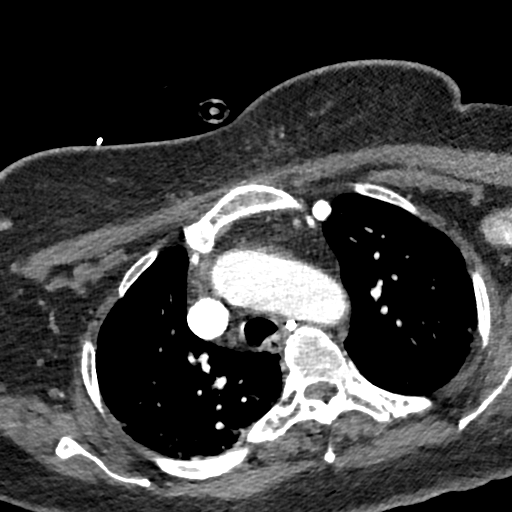
[im 43/688  bone]
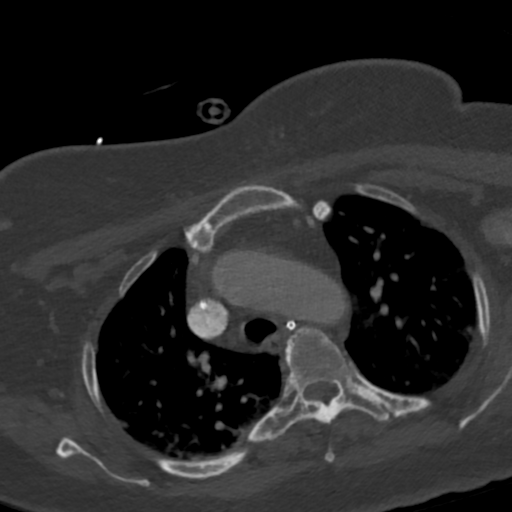
[im 129/688  soft-tissue]
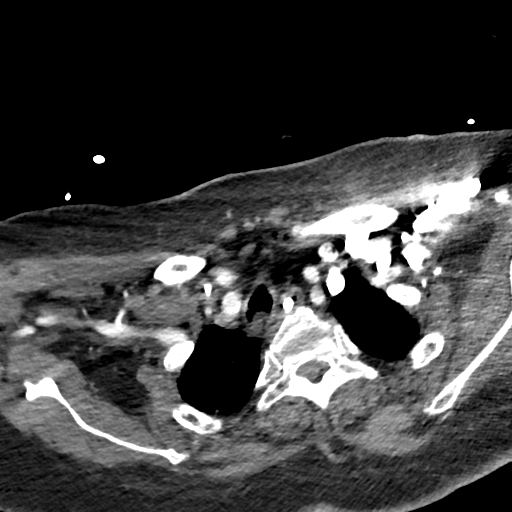
[im 172/688  soft-tissue]
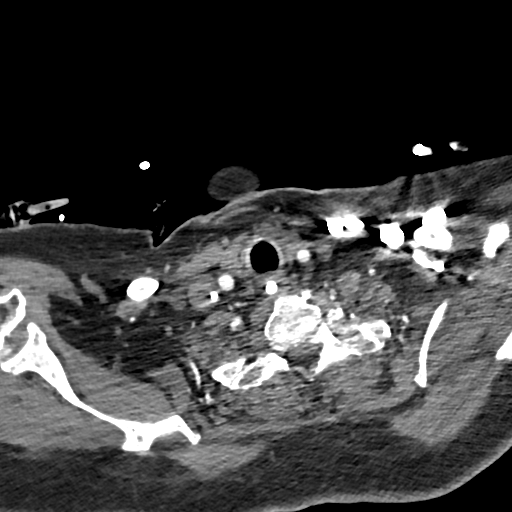
[im 215/688  soft-tissue]
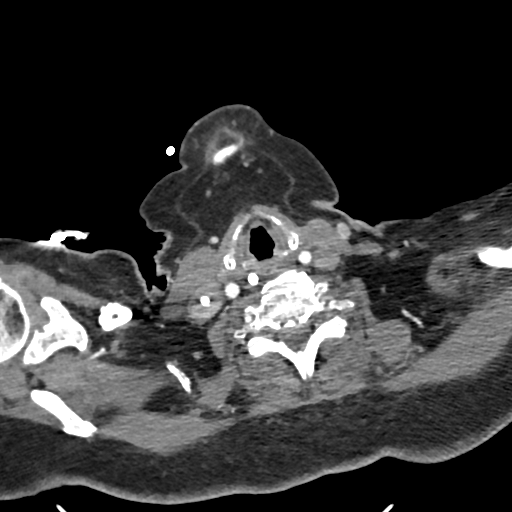
[im 301/688  soft-tissue]
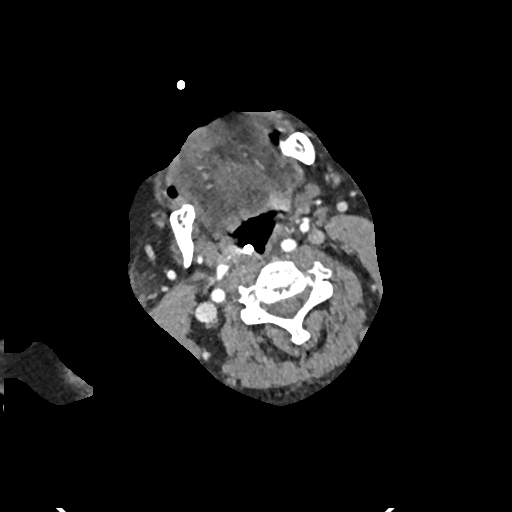
[im 344/688  soft-tissue]
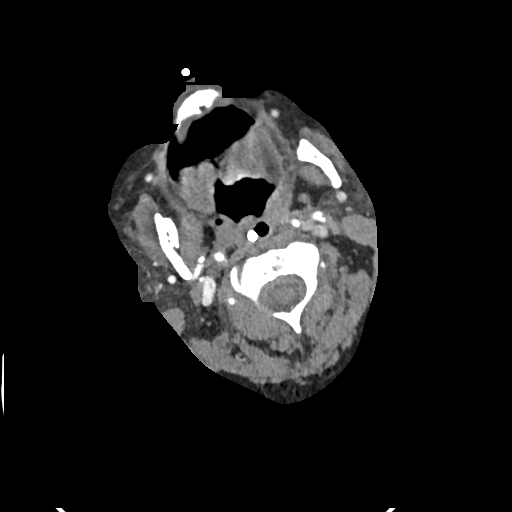
[im 387/688  soft-tissue]
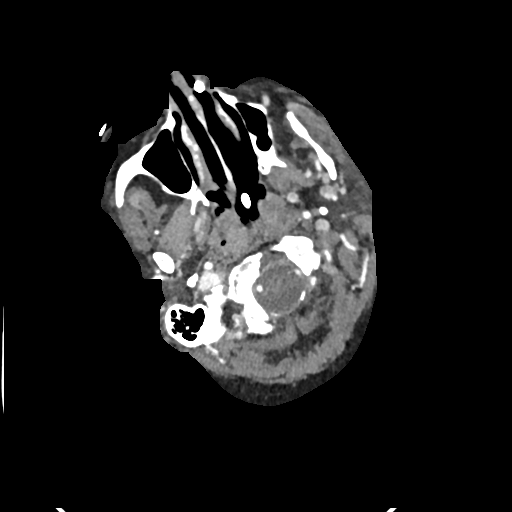
[im 473/688  soft-tissue]
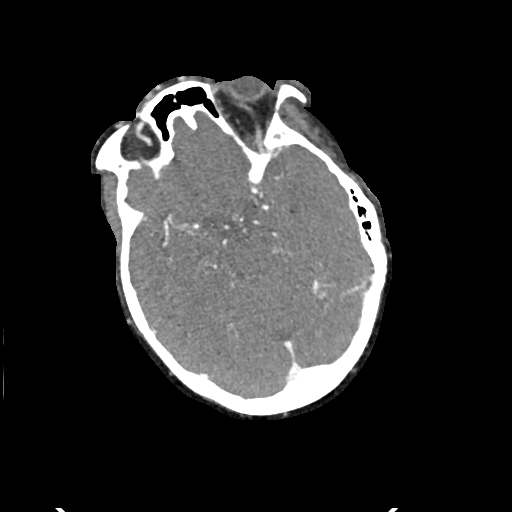
[im 516/688  soft-tissue]
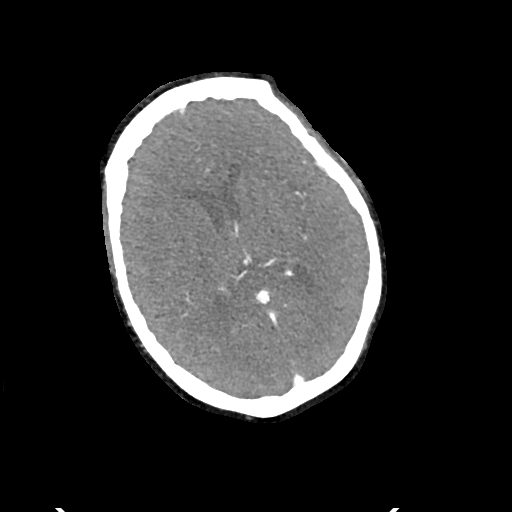
[im 516/688  lung]
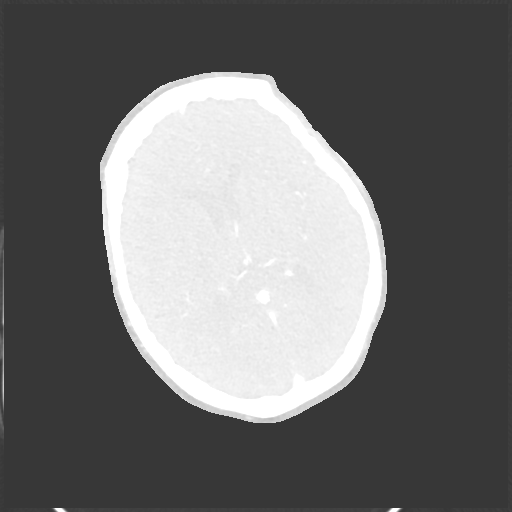
[im 516/688  bone]
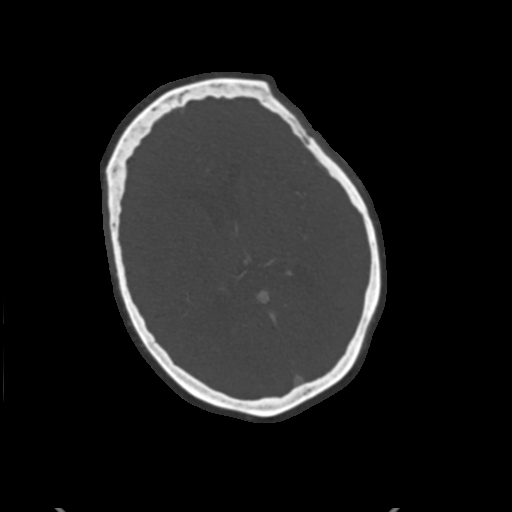
[im 559/688  soft-tissue]
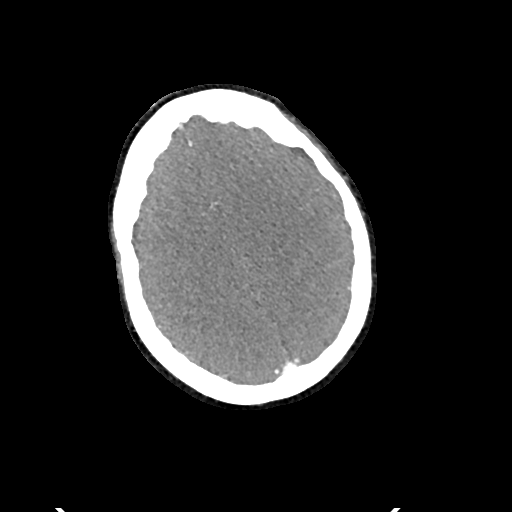
[im 559/688  lung]
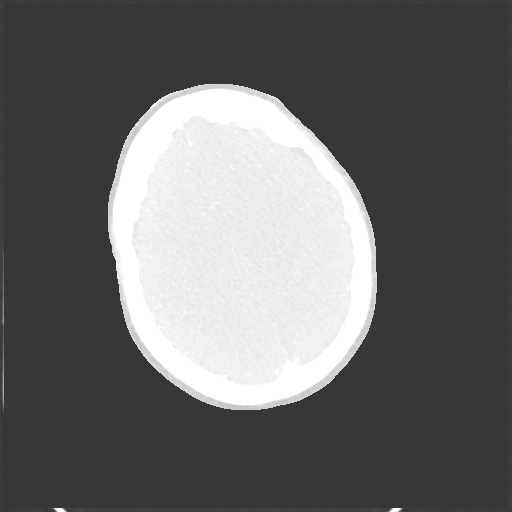
[im 602/688  lung]
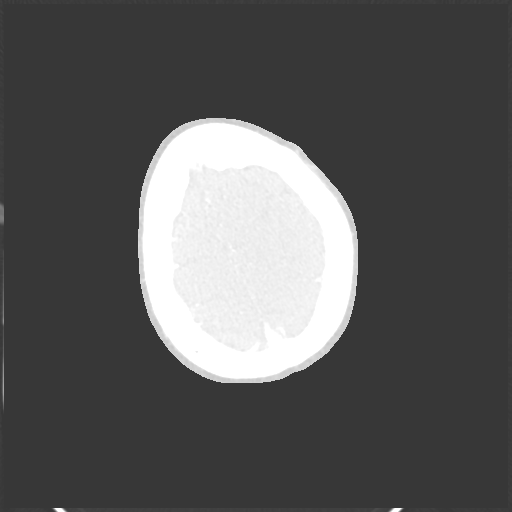
[im 645/688  soft-tissue]
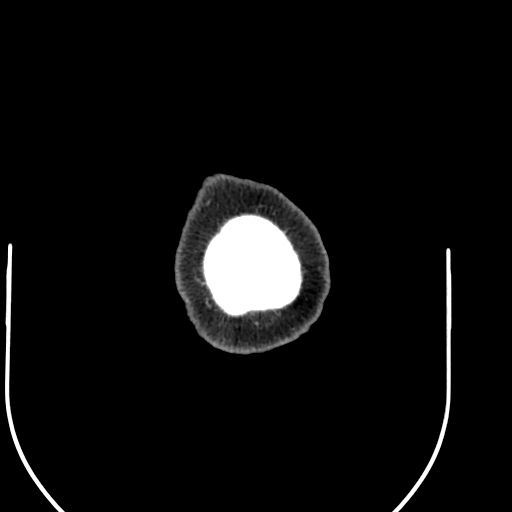
[im 645/688  lung]
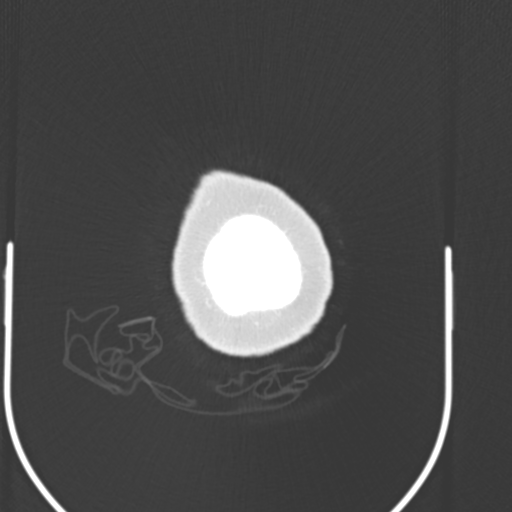

[12 of 46 positions shown; findings below may reference images not displayed]

FINDINGS: CT HEAD FINDINGS

The study is mildly motion degraded.

Brain: There is no evidence of acute infarct, intracranial
hemorrhage, mass, midline shift, or extra-axial fluid collection.
The ventricles and sulci are unchanged and within normal limits for
age.

Vascular: Reported below.

Skull: No fracture or focal osseous lesion.

Sinuses: Paranasal sinuses and mastoid air cells are clear.

Orbits: Unremarkable.

Review of the MIP images confirms the above findings

CTA NECK FINDINGS

The study is mildly motion degraded.

Aortic arch: Normal variant aortic arch branching pattern with
common origin of the brachiocephalic and left common carotid
arteries. Widely patent arch vessel origins.

Right carotid system: Patent without evidence of stenosis or
dissection. Mild irregularity of the mid and distal cervical ICA
attributed to motion.

Left carotid system: Patent without evidence of significant stenosis
or dissection. More notable irregularity/beaded appearance of the
mid cervical ICA which is at least partly due to motion artifact
although a component of underlying vasculopathy is also possible.

Vertebral arteries: The vertebral arteries are patent without
evidence of significant stenosis or dissection and with the right
being moderately to strongly dominant.

Skeleton: Moderately advanced cervical spondylosis.

Other neck: No evidence of cervical lymphadenopathy or mass.
Partially visualized nasoenteric tube.

Upper chest: Partially visualized right jugular venous catheter.
Mild peripheral opacities in both lung apices. Calcified nodule in
the left upper lobe.

Review of the MIP images confirms the above findings

CTA HEAD FINDINGS

Anterior circulation: The internal carotid arteries are widely
patent from skull base to carotid termini. ACAs and MCAs are patent
without evidence of proximal branch occlusion. The left A1 segment
is diminutive or absent, and there is a patent anterior
communicating artery. Diffuse irregularity of the small and
medium-sized vessels with distal branch vessel attenuation is likely
at least partly technical/artifactual, however this limits
assessment for vasculitis. No aneurysm is identified.

Posterior circulation: The intracranial vertebral arteries are
patent to the basilar with severe multifocal stenosis of the mid to
distal left V4 segment and milder narrowing on of the distal right
V4 segment. The basilar artery is patent and congenitally small
diffusely without evidence of a flow limiting stenosis. Both PCAs
are patent with diffuse irregularity but without evidence of a flow
limiting proximal stenosis. No aneurysm is identified.

Venous sinuses: Patent.

Anatomic variants: Fetal origin of the PCAs.

Review of the MIP images confirms the above findings
IMPRESSION: 1. Motion degraded examination without large vessel occlusion or
significant arterial stenosis in the neck.
2. Irregularity/beading of the left mid cervical ICA, likely in part
related to motion artifact although there may be underlying
fibromuscular dysplasia or other vasculopathy.
3. Severe V4 segment stenosis of the nondominant left vertebral
artery.
4. Diffuse irregularity and attenuation of the small and
medium-sized intracranial arteries which may be
technical/artifactual although an underlying vasculitis is not
excluded.
5. Unremarkable noncontrast CT appearance of the brain within
limitations of motion artifact.

## 2019-03-31 IMAGING — CT CT ANGIO HEAD
2 of 3 series · 15 of 35 positions shown · IV contrast (omnipaque)
Comparison: Head CT [DATE] and MRI [DATE].

CLINICAL DATA: Catatonia.  [XW] infection.  Vasculitis.

EXAM:
CT ANGIOGRAPHY HEAD AND NECK
TECHNIQUE: Multidetector CT imaging of the head and neck was performed using
the standard protocol during bolus administration of intravenous
contrast. Multiplanar CT image reconstructions and MIPs were
obtained to evaluate the vascular anatomy. Carotid stenosis
measurements (when applicable) are obtained utilizing NASCET
criteria, using the distal internal carotid diameter as the
denominator.
CONTRAST:  75mL OMNIPAQUE IOHEXOL 350 MG/ML SOLN

[Series 3: coronal · axial · 0.46mm/px · z∈[-129,+6]mm · 12 of 33 slices shown]
[im 3/33  soft-tissue]
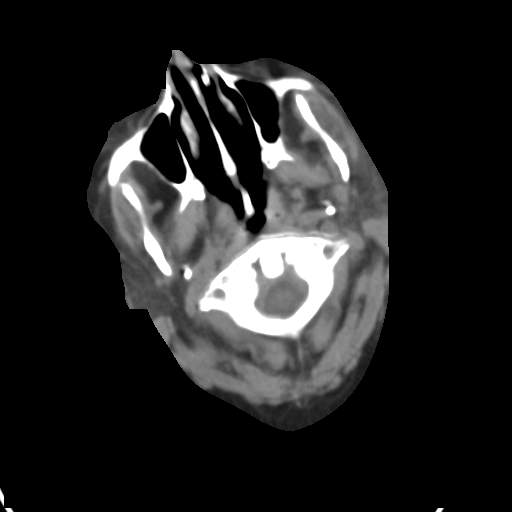
[im 5/33  bone]
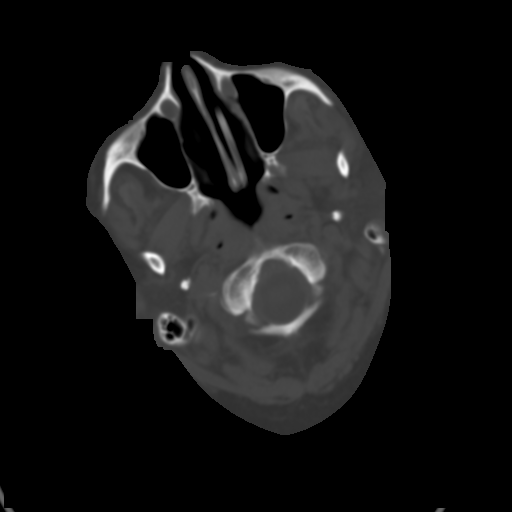
[im 8/33  soft-tissue]
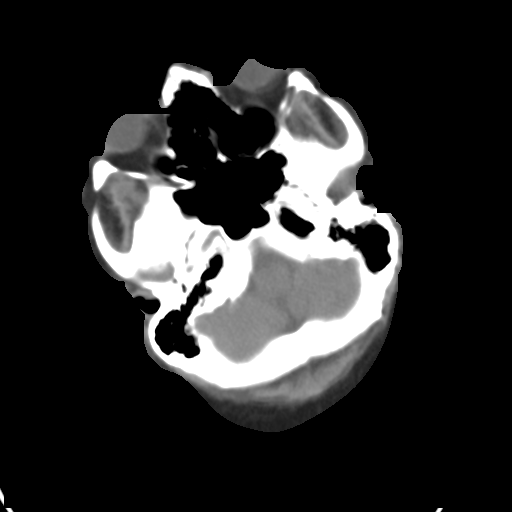
[im 10/33  bone]
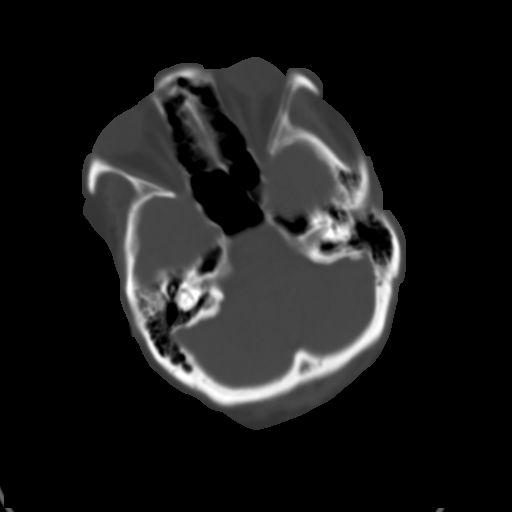
[im 13/33  soft-tissue]
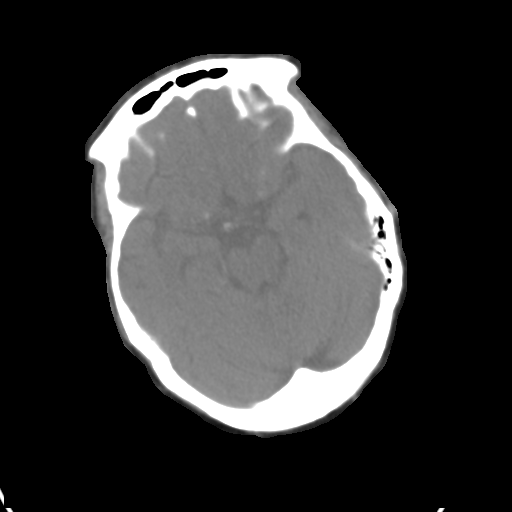
[im 15/33  bone]
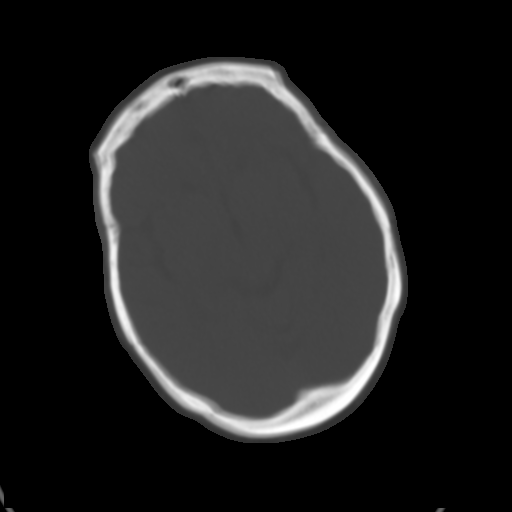
[im 18/33  soft-tissue]
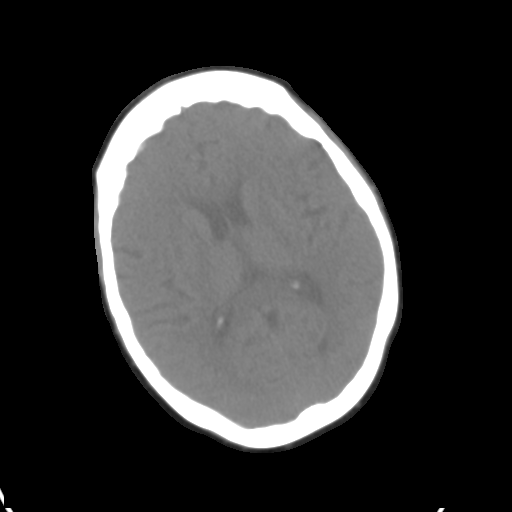
[im 20/33  bone]
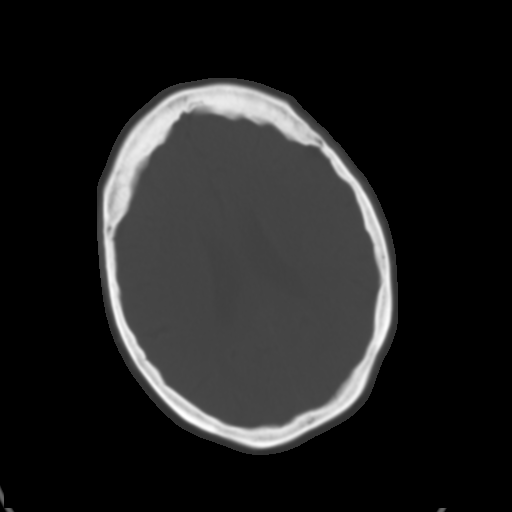
[im 23/33  soft-tissue]
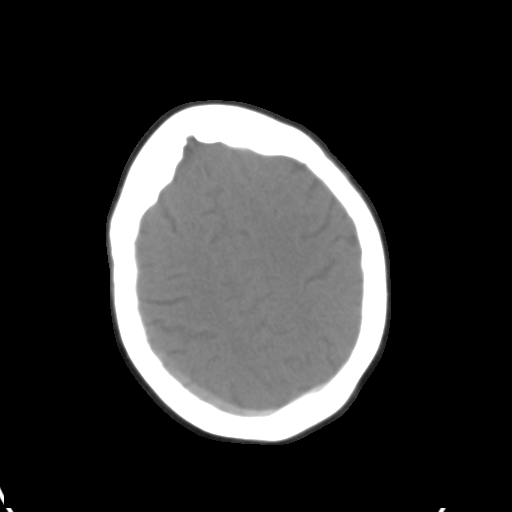
[im 25/33  bone]
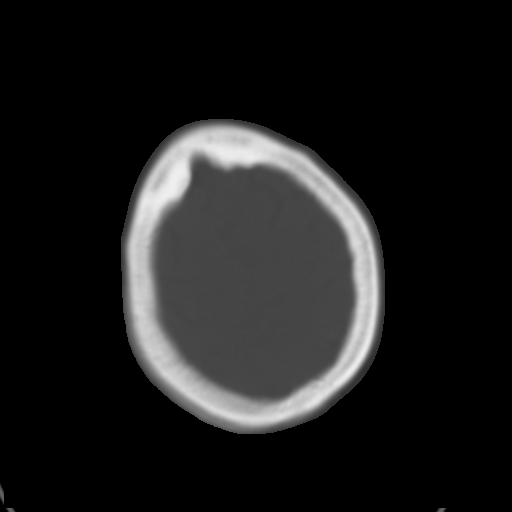
[im 28/33  soft-tissue]
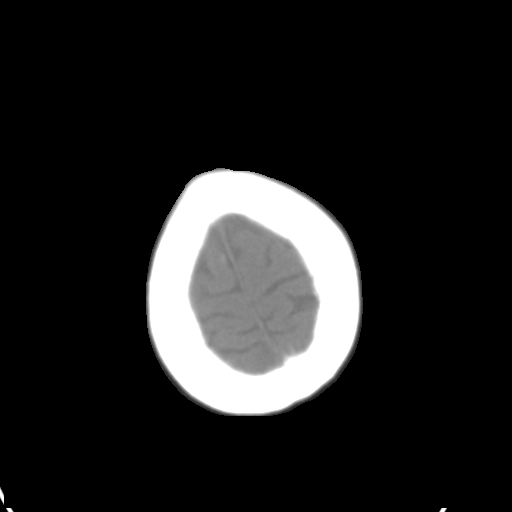
[im 30/33  bone]
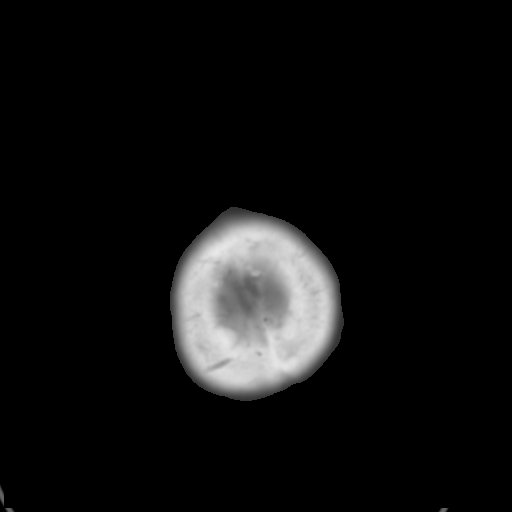

[Series 6: sagittal · sagittal · 0.32mm/px · 3 of 41 slices shown]
[im 16/41  soft-tissue]
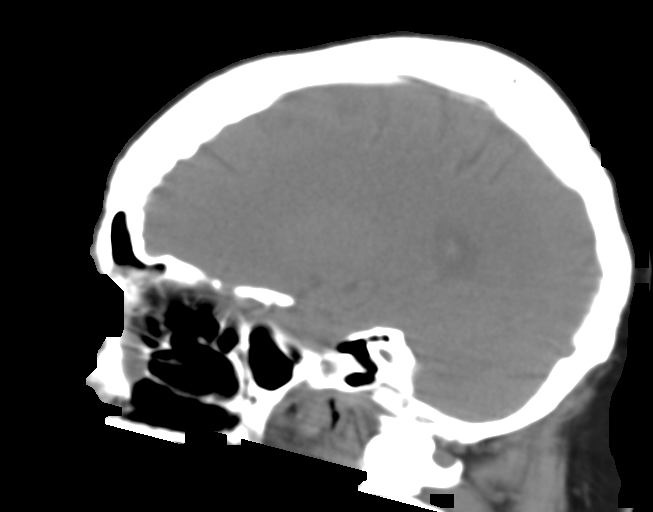
[im 21/41  soft-tissue]
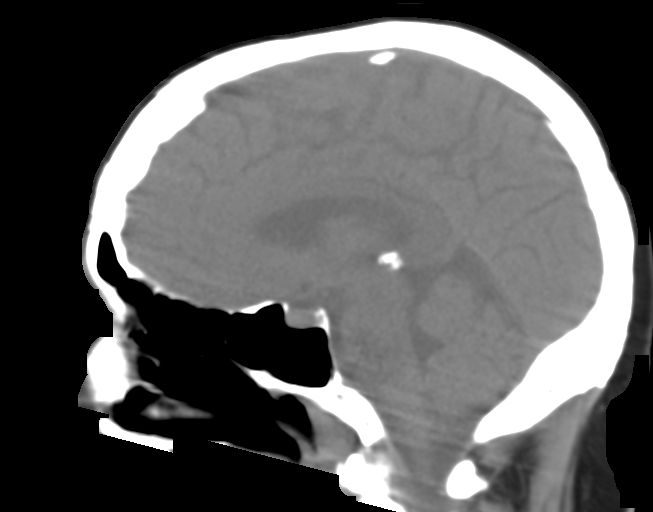
[im 26/41  soft-tissue]
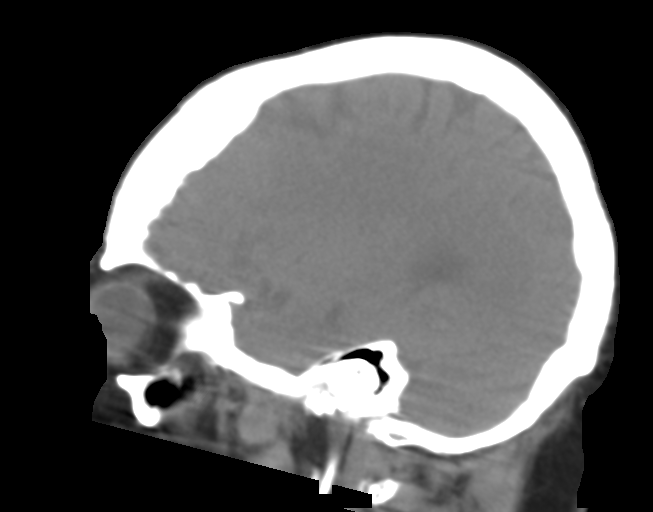

[15 of 35 positions shown; findings below may reference images not displayed]

FINDINGS: CT HEAD FINDINGS

The study is mildly motion degraded.

Brain: There is no evidence of acute infarct, intracranial
hemorrhage, mass, midline shift, or extra-axial fluid collection.
The ventricles and sulci are unchanged and within normal limits for
age.

Vascular: Reported below.

Skull: No fracture or focal osseous lesion.

Sinuses: Paranasal sinuses and mastoid air cells are clear.

Orbits: Unremarkable.

Review of the MIP images confirms the above findings

CTA NECK FINDINGS

The study is mildly motion degraded.

Aortic arch: Normal variant aortic arch branching pattern with
common origin of the brachiocephalic and left common carotid
arteries. Widely patent arch vessel origins.

Right carotid system: Patent without evidence of stenosis or
dissection. Mild irregularity of the mid and distal cervical ICA
attributed to motion.

Left carotid system: Patent without evidence of significant stenosis
or dissection. More notable irregularity/beaded appearance of the
mid cervical ICA which is at least partly due to motion artifact
although a component of underlying vasculopathy is also possible.

Vertebral arteries: The vertebral arteries are patent without
evidence of significant stenosis or dissection and with the right
being moderately to strongly dominant.

Skeleton: Moderately advanced cervical spondylosis.

Other neck: No evidence of cervical lymphadenopathy or mass.
Partially visualized nasoenteric tube.

Upper chest: Partially visualized right jugular venous catheter.
Mild peripheral opacities in both lung apices. Calcified nodule in
the left upper lobe.

Review of the MIP images confirms the above findings

CTA HEAD FINDINGS

Anterior circulation: The internal carotid arteries are widely
patent from skull base to carotid termini. ACAs and MCAs are patent
without evidence of proximal branch occlusion. The left A1 segment
is diminutive or absent, and there is a patent anterior
communicating artery. Diffuse irregularity of the small and
medium-sized vessels with distal branch vessel attenuation is likely
at least partly technical/artifactual, however this limits
assessment for vasculitis. No aneurysm is identified.

Posterior circulation: The intracranial vertebral arteries are
patent to the basilar with severe multifocal stenosis of the mid to
distal left V4 segment and milder narrowing on of the distal right
V4 segment. The basilar artery is patent and congenitally small
diffusely without evidence of a flow limiting stenosis. Both PCAs
are patent with diffuse irregularity but without evidence of a flow
limiting proximal stenosis. No aneurysm is identified.

Venous sinuses: Patent.

Anatomic variants: Fetal origin of the PCAs.

Review of the MIP images confirms the above findings
IMPRESSION: 1. Motion degraded examination without large vessel occlusion or
significant arterial stenosis in the neck.
2. Irregularity/beading of the left mid cervical ICA, likely in part
related to motion artifact although there may be underlying
fibromuscular dysplasia or other vasculopathy.
3. Severe V4 segment stenosis of the nondominant left vertebral
artery.
4. Diffuse irregularity and attenuation of the small and
medium-sized intracranial arteries which may be
technical/artifactual although an underlying vasculitis is not
excluded.
5. Unremarkable noncontrast CT appearance of the brain within
limitations of motion artifact.

## 2019-03-31 MED ORDER — LORAZEPAM 2 MG/ML IJ SOLN
1.0000 mg | Freq: Once | INTRAMUSCULAR | Status: AC
  Administered 2019-03-31: 1 mg via INTRAVENOUS
  Filled 2019-03-31: qty 1

## 2019-03-31 MED ORDER — IOHEXOL 350 MG/ML SOLN
75.0000 mL | Freq: Once | INTRAVENOUS | Status: AC | PRN
  Administered 2019-03-31: 75 mL via INTRAVENOUS

## 2019-03-31 MED ORDER — POTASSIUM CL IN DEXTROSE 5% 20 MEQ/L IV SOLN
20.0000 meq | INTRAVENOUS | Status: DC
  Administered 2019-03-31 – 2019-04-01 (×2): 20 meq via INTRAVENOUS
  Filled 2019-03-31 (×2): qty 1000

## 2019-03-31 NOTE — Progress Notes (Signed)
Paged Neurology provider to make aware patient will need medication to do CT, awaiting orders.

## 2019-03-31 NOTE — Progress Notes (Signed)
Paged neurology for medication to calm patient for CT, verbal order for Lorazepam 1 mg IV.

## 2019-03-31 NOTE — Progress Notes (Signed)
Provider paged to make aware no more occurrences left on prior CBC order,  Last results for Rebecca Bruce, Rebecca Bruce (MRN [id number])  Ref. Range 03/31/2019 04:17  Hemoglobin Latest Ref Range: 12.0 - 15.0 g/dL 7.3 (L)  HCT Latest Ref Range: 36.0 - 46.0 % 21.7 (L)  No orders at this time.

## 2019-03-31 NOTE — Progress Notes (Addendum)
NAME:  Rebecca Bruce, MRN:  [id number], DOB:  [birth year], LOS: 106 ADMISSION DATE:  03/11/2019, CONSULTATION DATE:  12/4 REFERRING MD:  Sloan Leiter, CHIEF COMPLAINT:  Inability to protect airway   Brief History   58 y/o blk fem Hypothyroid, htn, intellectual debility [grp home resident for 20 yr] brought to [facility] on 11/28 in the setting of altered mental status from her group home.  She was found to have COVID pneumonia, treated with decadron and remdesivir then sent to [facility] on 11/30 - progressive increase in acute encephalopathy and has become less and less responsive.   12/4 PCCM asked to see her because she reached the point that she was aspirating and not responding to pharyngeal/tracheal suctioning.   exam findings have waxed and waned over the last three days but have not generally moved in the right direction.   Neurology saw her on 12/2 and apparently at that time she followed some simple commands, but she was non-verbal at the time with significant rigidity noted in her upper extremities.   At baseline lives in a group home, can bathe herself and have a conversation.  Has baseline anxiety and depression.  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   She is transferred from [facility] 12/18 due to finding of hemoperitoneum for emergent angiogram  Past Medical History  Thyroid disease Hypertension GERD Anxiety  Depression [place] [facility] Events   11/28 Admission [facility] 11/30 Transfer to [facility] 12/04 Intubation for inability to protect airway 12/05 Diprivan, fentanyl gtt. Suctioned large plug by RT >> Peak pressure improved.  Tol SBT 12/12 Tx to [place]. Sedation decreased but put back on fentanyl drip overnight for tachycardia.  Failed SBT 12/13 PSV wean, mental status barrier to extubation 12/14: Extubated.  During the evening hours patient hypotensive.  This was after receiving propranolol  placed on Neo-Synephrine and administered albumin 12/15: phenylephrine doses increasing.  12/16: pressor demands down after volume, stress steroids, and art line placement.  12/17: Hemoglobin acutely dropped from 11.6 down to 3.9, received 2 units of blood, CT abdomen pelvis showing large new hemoperitoneum involving the left upper quadrant of the abdomen near the splenic flexure of the colon etiology was not clear.  All anticoagulation was discontinued. 12/18: Received another unit of blood overnight, hemoglobin had gone from 7.4 down to 6.9, with no bump following transfusion.  General surgery consulted.  Interventional radiology also consulted.  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  R Micro Data:  11/27 urine culture > multiple species 11/28 blood > negative 11/28 SARS COV 2 > positive 12/04 sputum > normal flora  Antimicrobials:  11/28 Vanc > 11/30, restart 12/2 > 12/7 11/28 mero >  12/2 11/29 remdesivir > 12/3 12/02 ceftriaxone > 12/7 12/04 flagyl > off  Interim history/subjective:  No verbale and cannot give ROS Arouses to harsh sound  and menance-but otherwsie falls back to sleep   Objective   Blood pressure (!) 142/73, pulse 66, temperature 98.9 F (37.2 C), temperature source Axillary, resp. rate (!) 23, height 5\' 2"  (1.575 m), weight 87.6 kg, SpO2 97 %.        Intake/Output Summary (Last 24 hours) at  03/31/2019 1608 Last data filed at 03/31/2019 1501 Gross per 24 hour  Intake 2355.15 ml  Output 725 ml  Net 1630.15 ml   Filed Weights   03/24/19 0449 03/25/19 0223 03/31/19 0424  Weight: 88.9 kg 87.9 kg 87.6 kg    Examination: General: Sitting up in bed, no distress HEENT normocephalic pale mucous membranes no JVD-slight deviaiton of mouth to the R side Cortrak in place RIJ in place Pulmonary decreased breath sounds, no rhonchi Cardiac: Regular rate and rhythm Abdomen: Soft, some distension--no grimace to deep pressure Extremities: Warm and dry Neuro: Eyes open, nonverbal , continuous movements GU: Untreated yellow  Resolved Hospital Problem list   Hypotension  Acute hypoxic respiratory failure Assessment & Plan:   Hemorrhagic shock with acute blood loss anemia w/ spontaneous hemoperitoneum.   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 Has been evaluated by neurology. Only workup remaining is for stiff person sydrome and anti-GAD antibodies are pending  Plan Supportive care--await formal read of CTA head and CT head Avoid sedating medications Will inform family of imaging changes if there are any  AKI hypokalemia Likely 2/2 ATN Plan As not eating and becoming slightly hypernatremic, give D5 and K 20 Rpt labs in am  Relative adrenal dysfunction Plan Dc Stress dose steroids  Hypothyroidism Plan Synthroid 100 mcg  Pulmonary nodule.  Left upper lobe 6 mm in size Plan/recommendation CT 6 to 12 months  Best practice:  Diet: tube feeding DVT prophylaxis: SCDs GI  prophylaxis: famotidine Mobility:OOB Code Status: full Disposition: ICU --SDU Family: none today   Verneita Griffes, MD [facility] 4:15 PM   03/31/2019

## 2019-04-01 ENCOUNTER — Inpatient Hospital Stay (HOSPITAL_COMMUNITY): Payer: Medicare Other

## 2019-04-01 LAB — CBC WITH DIFFERENTIAL/PLATELET
Abs Immature Granulocytes: 0.15 10*3/uL — ABNORMAL HIGH (ref 0.00–0.07)
Basophils Absolute: 0 10*3/uL (ref 0.0–0.1)
Basophils Relative: 0 %
Eosinophils Absolute: 0.5 10*3/uL (ref 0.0–0.5)
Eosinophils Relative: 4 %
HCT: 24.1 % — ABNORMAL LOW (ref 36.0–46.0)
Hemoglobin: 7.8 g/dL — ABNORMAL LOW (ref 12.0–15.0)
Immature Granulocytes: 1 %
Lymphocytes Relative: 8 %
Lymphs Abs: 1.1 10*3/uL (ref 0.7–4.0)
MCH: 30.2 pg (ref 26.0–34.0)
MCHC: 32.4 g/dL (ref 30.0–36.0)
MCV: 93.4 fL (ref 80.0–100.0)
Monocytes Absolute: 1 10*3/uL (ref 0.1–1.0)
Monocytes Relative: 7 %
Neutro Abs: 11.3 10*3/uL — ABNORMAL HIGH (ref 1.7–7.7)
Neutrophils Relative %: 80 %
Platelets: 94 10*3/uL — ABNORMAL LOW (ref 150–400)
RBC: 2.58 MIL/uL — ABNORMAL LOW (ref 3.87–5.11)
RDW: 17.2 % — ABNORMAL HIGH (ref 11.5–15.5)
WBC: 14 10*3/uL — ABNORMAL HIGH (ref 4.0–10.5)
nRBC: 2.8 % — ABNORMAL HIGH (ref 0.0–0.2)

## 2019-04-01 LAB — COMPREHENSIVE METABOLIC PANEL
ALT: 364 U/L — ABNORMAL HIGH (ref 0–44)
AST: 51 U/L — ABNORMAL HIGH (ref 15–41)
Albumin: 2.6 g/dL — ABNORMAL LOW (ref 3.5–5.0)
Alkaline Phosphatase: 59 U/L (ref 38–126)
Anion gap: 9 (ref 5–15)
BUN: 42 mg/dL — ABNORMAL HIGH (ref 6–20)
CO2: 25 mmol/L (ref 22–32)
Calcium: 8.3 mg/dL — ABNORMAL LOW (ref 8.9–10.3)
Chloride: 111 mmol/L (ref 98–111)
Creatinine, Ser: 1.16 mg/dL — ABNORMAL HIGH (ref 0.44–1.00)
GFR calc Af Amer: >60 mL/min (ref >60)
GFR calc non Af Amer: 52 mL/min — ABNORMAL LOW (ref >60)
Glucose, Bld: 133 mg/dL — ABNORMAL HIGH (ref 70–99)
Potassium: 2.9 mmol/L — ABNORMAL LOW (ref 3.5–5.1)
Sodium: 145 mmol/L (ref 135–145)
Total Bilirubin: 1.6 mg/dL — ABNORMAL HIGH (ref 0.3–1.2)
Total Protein: 4.9 g/dL — ABNORMAL LOW (ref 6.5–8.1)

## 2019-04-01 LAB — PATHOLOGIST SMEAR REVIEW

## 2019-04-01 LAB — GLUCOSE, CAPILLARY
Glucose-Capillary: 103 mg/dL — ABNORMAL HIGH (ref 70–99)
Glucose-Capillary: 124 mg/dL — ABNORMAL HIGH (ref 70–99)
Glucose-Capillary: 127 mg/dL — ABNORMAL HIGH (ref 70–99)
Glucose-Capillary: 155 mg/dL — ABNORMAL HIGH (ref 70–99)
Glucose-Capillary: 89 mg/dL (ref 70–99)
Glucose-Capillary: 93 mg/dL (ref 70–99)

## 2019-04-01 LAB — AMMONIA: Ammonia: 13 umol/L (ref 9–35)

## 2019-04-01 LAB — VITAMIN B12: Vitamin B-12: 1297 pg/mL — ABNORMAL HIGH (ref 180–914)

## 2019-04-01 IMAGING — MR MR HEAD W/O CM
9 of 10 series · 36 of 48 positions shown · non-contrast
Comparison: MRI head [DATE].  CT head [DATE]

CLINICAL DATA: Focal neuro deficit. [UL] positive. Rule out
stroke.

EXAM:
MRI HEAD WITHOUT CONTRAST
TECHNIQUE: Multiplanar, multiecho pulse sequences of the brain and surrounding
structures were obtained without intravenous contrast.

[Series 4: DWI · axial · 3.0mm · 1.09mm/px · z∈[-56,+85]mm · 8 of 96 slices shown (1 of 4)]
[im 1/96]
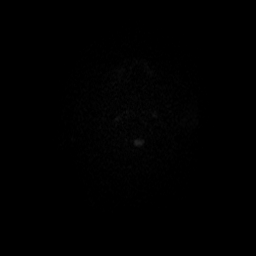
[im 11/96]
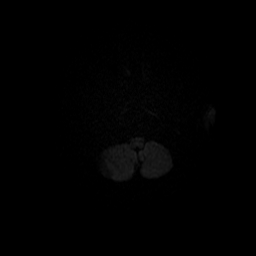
[im 32/96]
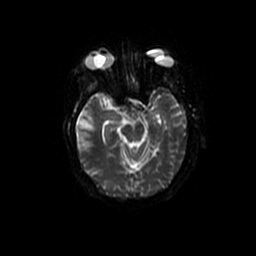
[im 43/96]
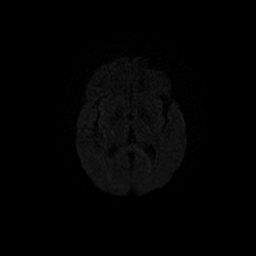
[im 53/96]
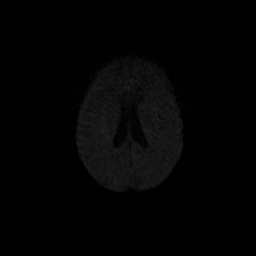
[im 64/96]
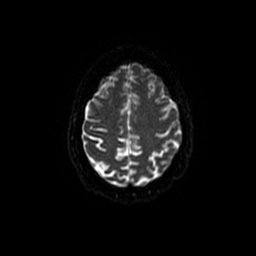
[im 85/96]
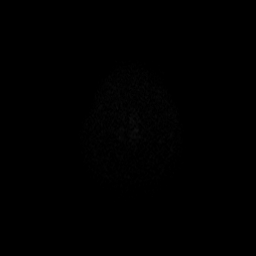
[im 96/96]
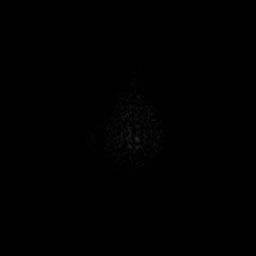

[Series 5: DWI · coronal · 5.0mm · 1.09mm/px · 7 of 74 slices shown (2 of 4)]
[im 1/74]
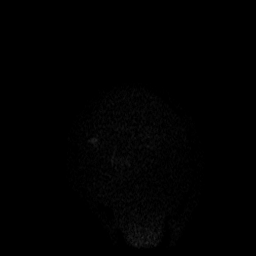
[im 13/74]
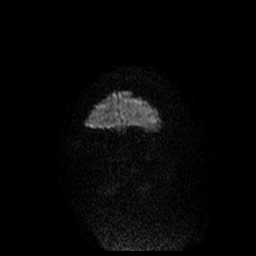
[im 25/74]
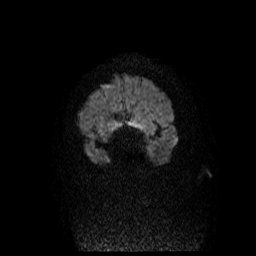
[im 37/74]
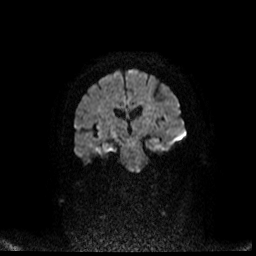
[im 49/74]
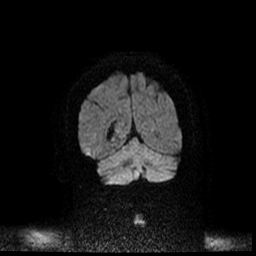
[im 61/74]
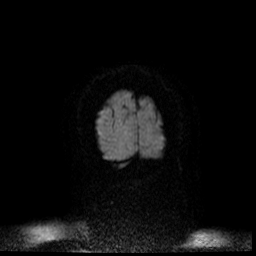
[im 74/74]
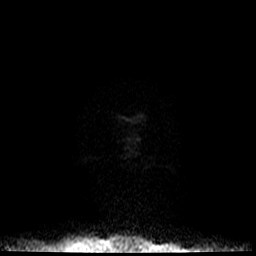

[Series 6: T1 · sagittal · 5.0mm · 0.47mm/px · 3 of 26 slices shown (1 of 2)]
[im 1/26]
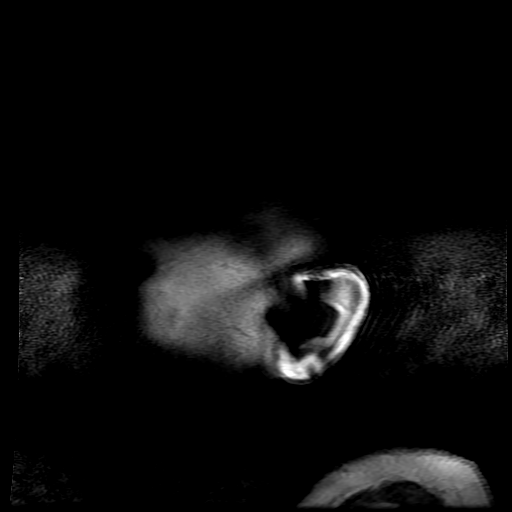
[im 13/26]
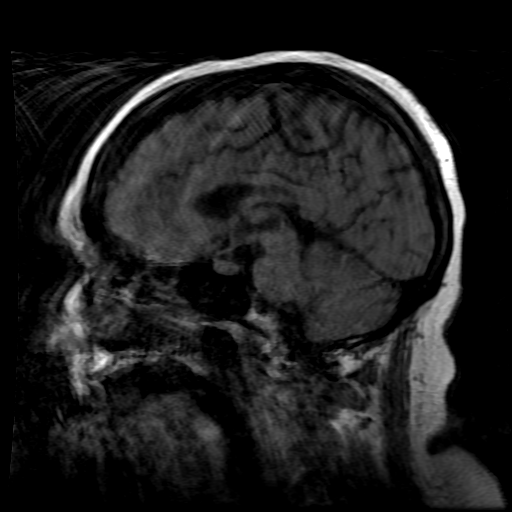
[im 26/26]
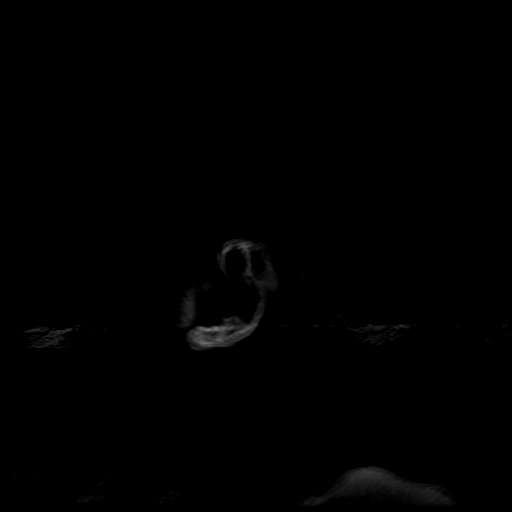

[Series 7: T2 · axial · 5.0mm · 0.43mm/px · z∈[-58,+86]mm · 2 of 25 slices shown (1 of 2)]
[im 1/25]
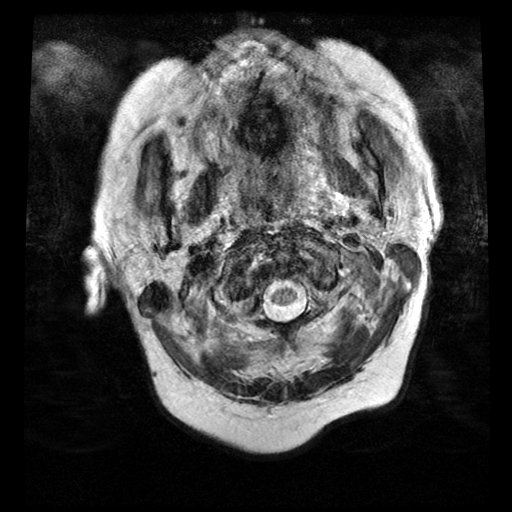
[im 25/25]
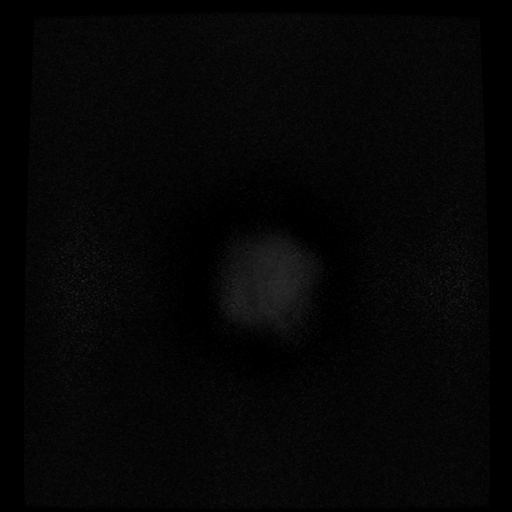

[Series 8: FLAIR · axial · 3.0mm · 0.43mm/px · z∈[-58,+86]mm · 2 of 25 slices shown]
[im 1/25]
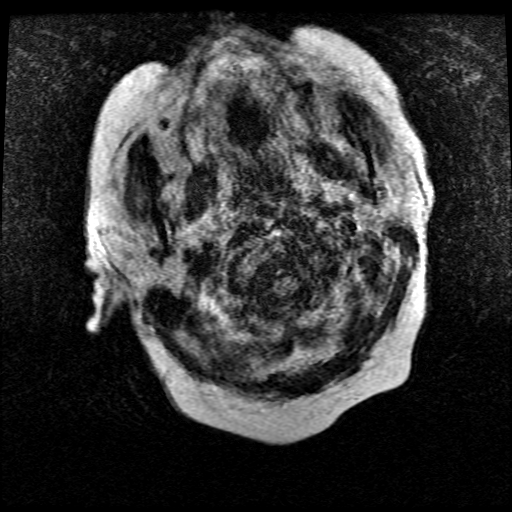
[im 25/25]
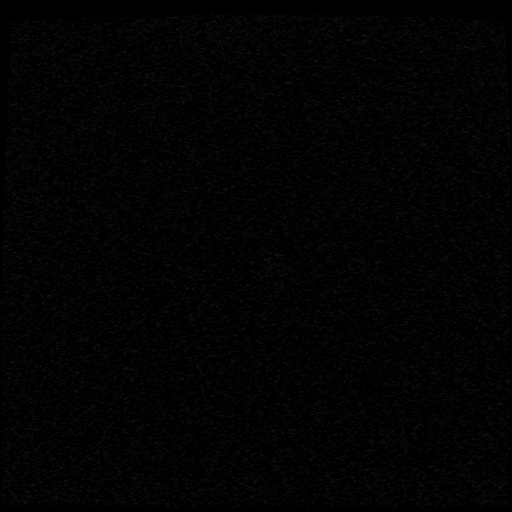

[Series 10: T1 · axial · 3.0mm · 0.47mm/px · z∈[-68,-52]mm · 2 of 100 slices shown (2 of 2)]
[im 1/100]
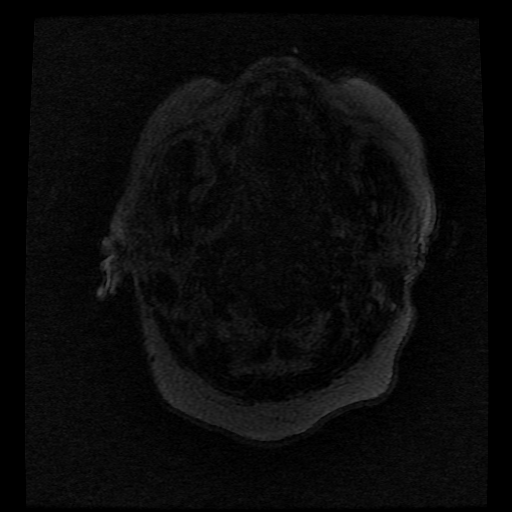
[im 12/100]
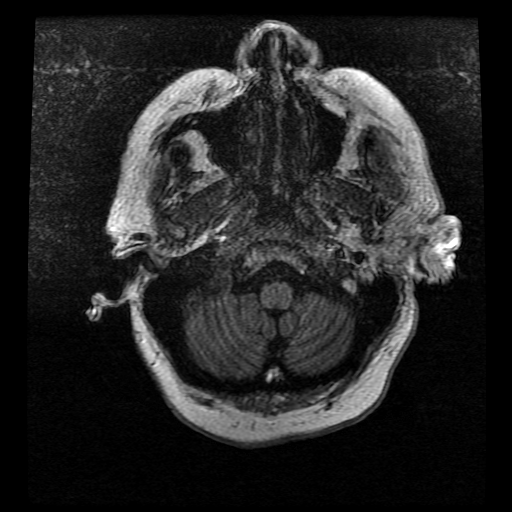

[Series 11: T2 · coronal · 5.0mm · 0.39mm/px · 3 of 28 slices shown (2 of 2)]
[im 1/28]
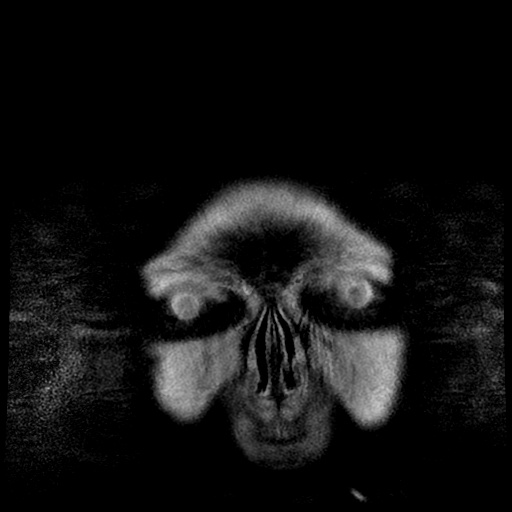
[im 14/28]
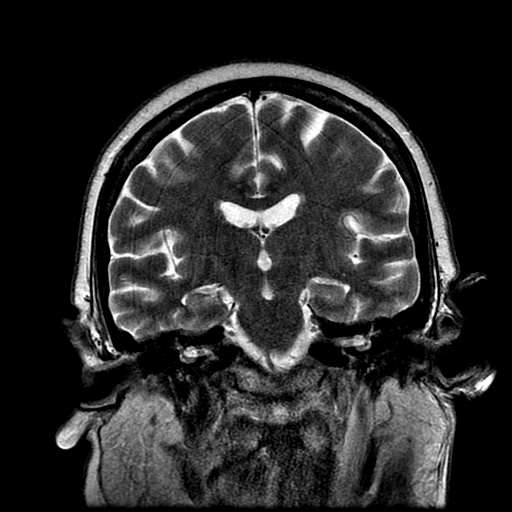
[im 28/28]
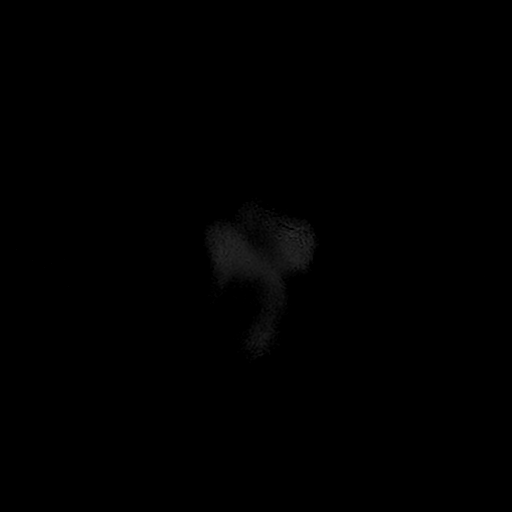

[Series 400: DWI · axial · 3.0mm · 1.09mm/px · z∈[-56,+85]mm · 5 of 48 slices shown (3 of 4)]
[im 1/48]
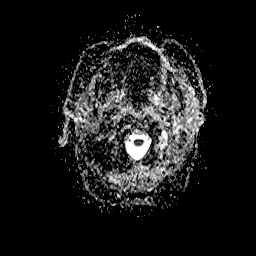
[im 12/48]
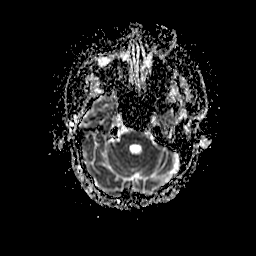
[im 24/48]
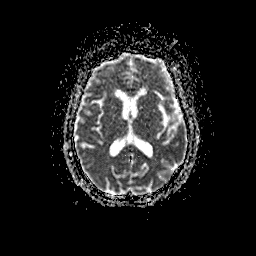
[im 36/48]
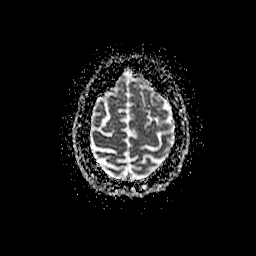
[im 48/48]
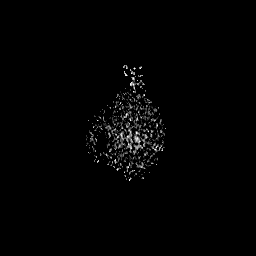

[Series 500: DWI · coronal · 5.0mm · 1.09mm/px · 4 of 37 slices shown (4 of 4)]
[im 1/37]
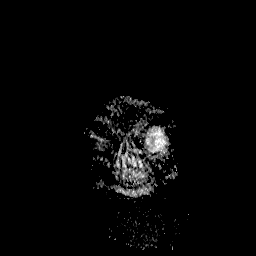
[im 13/37]
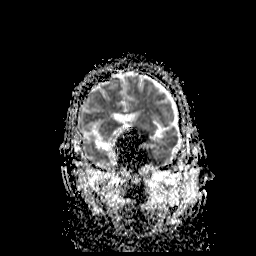
[im 25/37]
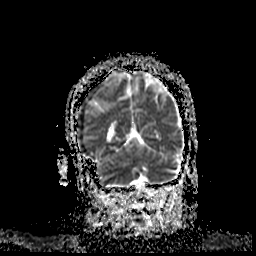
[im 37/37]
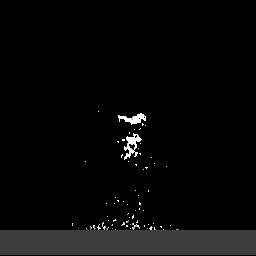

[36 of 48 positions shown; findings below may reference images not displayed]

FINDINGS: Brain: Image quality degraded by moderate motion.

Negative for acute infarct. Normal white matter. Negative for
hemorrhage or mass. Ventricle size normal.

Vascular: Normal arterial flow voids

Skull and upper cervical spine: Negative

Sinuses/Orbits: Negative

Other: None
IMPRESSION: Image quality degraded by moderate motion.

No acute abnormality.

## 2019-04-01 MED ORDER — LORAZEPAM 2 MG/ML IJ SOLN
0.5000 mg | Freq: Once | INTRAMUSCULAR | Status: AC
  Administered 2019-04-01: 0.5 mg via INTRAVENOUS
  Filled 2019-04-01: qty 1

## 2019-04-01 MED ORDER — PRO-STAT SUGAR FREE PO LIQD
30.0000 mL | Freq: Two times a day (BID) | ORAL | Status: DC
  Administered 2019-04-01 – 2019-04-06 (×11): 30 mL
  Filled 2019-04-01 (×11): qty 30

## 2019-04-01 MED ORDER — POTASSIUM CHLORIDE 2 MEQ/ML IV SOLN
INTRAVENOUS | Status: DC
  Filled 2019-04-01 (×2): qty 1000

## 2019-04-01 MED ORDER — OSMOLITE 1.5 CAL PO LIQD
1000.0000 mL | ORAL | Status: DC
  Administered 2019-04-01 – 2019-04-04 (×4): 1000 mL
  Filled 2019-04-01 (×9): qty 1000

## 2019-04-01 MED ORDER — THIAMINE HCL 100 MG PO TABS
100.0000 mg | ORAL_TABLET | Freq: Every day | ORAL | Status: DC
  Administered 2019-04-02 – 2019-04-07 (×6): 100 mg via ORAL
  Filled 2019-04-01 (×7): qty 1

## 2019-04-01 NOTE — Progress Notes (Signed)
Nutrition Follow-up  DOCUMENTATION CODES:   Obesity unspecified  INTERVENTION:   Follow for diet advancement; recommend continuing with Cortrak with supplemental nutrition even once diet advanced until pt meeting at least 60% of needs by mouth.   Tube Feeding: Osmolite 1.5 at 55 ml/hr Pro-Stat 30 mL BID Provides 113 g of protein, 2180 kcals and 1003 mL of free water Meets 100% estimated calorie and protein needs   NUTRITION DIAGNOSIS:   Increased nutrient needs related to acute illness(COVID PNA) as evidenced by estimated needs.  Being addressed via TF   GOAL:   Patient will meet greater than or equal to 90% of their needs  Progressing  MONITOR:   Vent status, Labs, Weight trends, TF tolerance, I & O's  REASON FOR ASSESSMENT:   (Cortrak request)    ASSESSMENT:   Pt with PMH of hypothyroidism, HTN, intellectual disability who lives in a group home. Pt admitted with AMS, poor oral intake, and COVID-19 PNA.  11/28 Admit  12/04 Intubated 12/14 Extubated 12/16 Cortrak placed-Gastric 12/17 CT abdomen with large new hemoperitoneum 12/18 Transferred to [facility] from [facility]  Pt more alert but not following commands. Agitated at time. Pt remains NPO, SLP to see for diet advancement  Vital High Protein at 20 ml/hr ordered on Saturday via Cortrak  Pt with newly documented stage II pressure injury  Labs: sodium 149 (H), Creatinine 1.24, BUN 55, potassium 3.0 (L) Meds: D5-NS with KCl at 100 ml/hr, thiamine, Vitamin C, zinc sulfate, ss novolog   Diet Order:   Diet Order    None      EDUCATION NEEDS:   No education needs have been identified at this time  Skin:  Skin Assessment: Reviewed RN Assessment Skin Integrity Issues:: Stage II Stage II: L buttock Other: MASD- perineum, groin  Last BM:  12/21  Height:   Ht Readings from Last 1 Encounters:  03/12/19 5\' 2"  (1.575 m)    Weight:   Wt Readings from Last 1 Encounters:  04/01/19 96.6 kg    Ideal  Body Weight:  50 kg  BMI:  Body mass index is 38.95 kg/m.  Estimated Nutritional Needs:   Kcal:  1725-2150  Protein:  100-125 gm  Fluid:  >/= 1.8 L   Cate Krrish Freund MS, RDN, LDN, CNSC [phone] Pager  [phone] Weekend/On-Call Pager

## 2019-04-01 NOTE — Progress Notes (Signed)
Pt received 0.5mg  Ativan IV during MRI due to increased agitation and screaming. Vss are stable. Continue to monitor the patient.

## 2019-04-01 NOTE — Progress Notes (Addendum)
NAME:  Rebecca Bruce, MRN:  [id number], DOB:  08-Apr-1961, LOS: 21 ADMISSION DATE:  03/11/2019, CONSULTATION DATE:  12/4 REFERRING MD:  Sloan Leiter, CHIEF COMPLAINT:  Inability to protect airway   Brief History   58 y/o blk fem Hypothyroid, htn, intellectual debility [grp home resident for 20 yr] brought to [facility] on 11/28 in the setting of altered mental status from her group home.  She was found to have COVID pneumonia, treated with decadron and remdesivir then sent to [facility] on 11/30 - progressive increase in acute encephalopathy and has become less and less responsive.   12/4 PCCM asked to see her because she reached the point that she was aspirating and not responding to pharyngeal/tracheal suctioning.   exam findings have waxed and waned over the last three days but have not generally moved in the right direction.   Neurology saw her on 12/2 and apparently at that time she followed some simple commands, but she was non-verbal at the time with significant rigidity noted in her upper extremities.   At baseline lives in a group home, can bathe herself and have a conversation.  Has baseline anxiety and depression.  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   She is transferred from [place] [facility] 12/18 due to finding of hemoperitoneum for emergent angiogram  Past Medical History  Thyroid disease Hypertension GERD Anxiety  Depression [place] [facility] Events   11/28 Admission [facility] 11/30 Transfer to [facility] 12/04 Intubation for inability to protect airway 12/05 Diprivan, fentanyl gtt. Suctioned large plug by RT >> Peak pressure improved.  Tol SBT 12/12 Tx to [place]. Sedation decreased but put back on fentanyl drip overnight for tachycardia.  Failed SBT 12/13 PSV wean, mental status barrier to extubation 12/14: Extubated.  During the evening hours patient hypotensive.  This was after receiving propranolol  placed on Neo-Synephrine and administered albumin 12/15: phenylephrine doses increasing.  12/16: pressor demands down after volume, stress steroids, and art line placement.  12/17: Hemoglobin acutely dropped from 11.6 down to 3.9, received 2 units of blood, CT abdomen pelvis showing large new hemoperitoneum involving the left upper quadrant of the abdomen near the splenic flexure of the colon etiology was not clear.  All anticoagulation was discontinued. 12/18: Received another unit of blood overnight, hemoglobin had gone from 7.4 down to 6.9, with no bump following transfusion.  General surgery consulted.  Interventional radiology also consulted.  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  R Micro Data:  11/27 urine culture > multiple species 11/28 blood > negative 11/28 SARS COV 2 > positive 12/04 sputum > normal flora  Antimicrobials:  11/28 Vanc > 11/30, restart 12/2 > 12/7 11/28 mero >  12/2 11/29 remdesivir > 12/3 12/02 ceftriaxone > 12/7 12/04 flagyl > off  Interim history/subjective:   much more alert but not really following commands yells  at me when I try to examine her Seems to be more with it She still remains agitated at times per nursing however  Objective   Blood pressure (!) 156/79, pulse 72, temperature 98.2 F (36.8 C), temperature source Oral, resp. rate (!) 24, height _0  (1.575 m), weight 96.6 kg, SpO2  100 %.        Intake/Output Summary (Last 24 hours) at 04/01/2019 1317 Last data filed at 04/01/2019 1100 Gross per 24 hour  Intake 2370.43 ml  Output 700 ml  Net 1670.43 ml   Filed Weights   03/25/19 0223 03/31/19 0424 04/01/19 0500  Weight: 87.9 kg 87.6 kg 96.6 kg    Examination: General: Sitting up in bed, eyes open and tracks HEENT normocephalic pale mucous membranes no JVD-mouth to the R side Cortrak in place RIJ in place Pulmonary decreased breath sounds, no rhonchi Cardiac: Regular rate and rhythm Abdomen: Soft, some distension--no grimace to deep pressure Extremities: Warm and dry Neuro: Eyes open, nonverbal , continuous movements GU: Untreated yellow  Resolved Hospital Problem list   Hypotension  Acute hypoxic respiratory failure Assessment & Plan:   Hemorrhagic shock with acute blood loss anemia w/ spontaneous hemoperitoneum.   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 Has been evaluated by neurology. Only workup remaining is for stiff person sydrome and anti-GAD antibodies are pending  Plan Supportive care--await formal read of CTA head and CT head Avoid sedating medications Await MRI brain as per neurology and further work-up although it seems like she is closer to baseline  AKI hypokalemia Likely 2/2 ATN Plan As not eating and becoming slightly hypernatremic, give D5 and K 60 and increase rate for 18 hours We will ask speech to see if she is much more awake today and maybe she can work with speech therapy to be placed on a  diet  Relative adrenal dysfunction Plan Dc Stress dose steroids  Hypothyroidism Plan Synthroid 100 mcg  Pulmonary nodule.  Left upper lobe 6 mm in size Plan/recommendation CT 6 to 12 months  Best practice:  Diet: tube feeding DVT prophylaxis: SCDs GI prophylaxis: famotidine Mobility:OOB Code Status: full Disposition: ICU --SDU Family: called sister, gave brief update--can usually awaken orient and make sense of her surroundings--does a lot at grp home--if u ask her what she lieks to eat likes Mac an and cheese and chicken and can verbalize this  Verneita Griffes, MD [facility] 1:17 PM   04/01/2019

## 2019-04-01 NOTE — Progress Notes (Signed)
Notified MD of patient's currently blood pressure being high. 189/87. Awaiting new orders.

## 2019-04-01 NOTE — Progress Notes (Signed)
Physical Therapy Treatment Patient Details Name: Rebecca Bruce MRN: [id number] DOB: 1960-10-12 Today's Date: 04/01/2019    History of Present Illness Pt is a 58 y.o. female admitted from group home to [facility] on 03/09/19 with AMS, poor oral intake; (+) COVID-19. Pt with progressive increase in acute encephalopathy . 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 Elevated CSF protein is compatible with but not diagnostic of a resolving viral encephalitis.  onia. Brain MRI 12/3 without acute abnormality. ETT 12/4-12/14. 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  PMH includes intellectual disability, HTN.    PT Comments    Pt with very, very slow progress. Resisting movement and frequently yelling out even when not moved or touched.    Follow Up Recommendations  SNF     Equipment Recommendations  Wheelchair cushion (measurements PT);Wheelchair (measurements PT)    Recommendations for Other Services       Precautions / Restrictions Precautions Precautions: Fall Restrictions Weight Bearing Restrictions: No    Mobility  Bed Mobility Overal bed mobility: Needs Assistance Bed Mobility: Supine to Sit;Sit to Supine     Supine to sit: +2 for physical assistance;Total assist;HOB elevated Sit to supine: +2 for physical assistance;Total assist   General bed mobility comments: Assist for all aspects. Pt not providing any assistance  Transfers                    Ambulation/Gait                 Stairs             Wheelchair Mobility    Modified Rankin (Stroke Patients Only)       Balance Overall balance assessment: Needs assistance Sitting-balance support: Bilateral  upper extremity supported;Feet unsupported Sitting balance-Leahy Scale: Zero Sitting balance - Comments: max assist to maintain sitting EOB x 4 minutes Postural control: Posterior lean                                  Cognition Arousal/Alertness: Awake/alert Behavior During Therapy: Flat affect Overall Cognitive Status: No family/caregiver present to determine baseline cognitive functioning                                 General Comments: Pt awake and alert. Pt frequently yelling/calling out. Difficult to understand what she says. At times may be in relation to pain but difficult to tell since she frequently yells without any touch or movement and then denies pain.      Exercises      General Comments General comments (skin integrity, edema, etc.): Attempted ex's but pt resistant to all movement.       Pertinent Vitals/Pain      Home Living                      Prior Function            PT Goals (current goals can now be found in the care plan section) Acute Rehab PT Goals Patient Stated Goal: Pt unable to state Progress towards PT goals: Progressing toward goals  Frequency    Min 2X/week      PT Plan Current plan remains appropriate    Co-evaluation              AM-PAC PT "6 Clicks" Mobility   Outcome Measure  Help needed turning from your back to your side while in a flat bed without using bedrails?: Total Help needed moving from lying on your back to sitting on the side of a flat bed without using bedrails?: Total Help needed moving to and from a bed to a chair (including a wheelchair)?: Total Help needed standing up from a chair using your arms (e.g., wheelchair or bedside chair)?: Total Help needed to walk in hospital room?: Total Help needed climbing 3-5 steps with a railing? : Total 6 Click Score: 6    End of Session   Activity Tolerance: Other (comment)(limited by resistance to movement) Patient left:  in bed;with call bell/phone within reach;with nursing/sitter in room Nurse Communication: Mobility status(nurse assisted with bed mobility) PT Visit Diagnosis: Other symptoms and signs involving the nervous system (R29.898)     Time: [id number] PT Time Calculation (min) (ACUTE ONLY): 18 min  Charges:  $Therapeutic Activity: 8-22 mins                     [place] Pager [phone] Office [phone]    Shary Decamp [facility] 04/01/2019, 4:00 PM

## 2019-04-01 NOTE — Progress Notes (Signed)
SLP Cancellation Note  Patient Details Name: Rebecca Bruce MRN: [id number] DOB: [birth year]   Cancelled treatment:        Attempted to see pt for tolerance/initiation of po's however she was in MRI. Will continue efforts tomorrow.    Houston Siren 04/01/2019, 4:22 PM

## 2019-04-01 NOTE — Progress Notes (Signed)
Subjective: continues to moan.   Exam: Vitals:   04/01/19 0730 04/01/19 0800  BP: (!) 173/93 (!) 167/98  Pulse: 86 87  Resp: 16 (!) 22  Temp:  98.1 F (36.7 C)  SpO2: 99% 100%   Gen: In bed, NAD Resp: non-labored breathing, no acute distress Abd: soft, nt  Neuro: MS: Awake, moans, but she does count fingers, with some perseveration. She tells me her name(though quite dysarthric). She does not follow appendicular commands.  CN:VFF, PERRL, EOMI Motor: she actively resists movement, difficult to tell if paratonia vs rigidity.  Sensory:she winces to pain in all extremities.   Pertinent Labs: WBC 14 Cr 1.16 Albumin 2.6  TSH from 11/28 3.24  Impression: 58 yo F with baseline MR and persistent encephalopathy in the setting of multiple medical comorbidities(malnutrition, hemoperitoneum, covid infection, prolonged ICU stay). The possibility of friable vessels from covid vasculitis was raised on her systemic angiogram and a small vessel vasculitis could certainly play a role in her current encephalopathy, but I do not yet think we have definite evidence of this. An MRI showing extensive progresssion of white matter disease could be helpful in making this diagnosis. She was on decadron as part of her covid treatment.   I think that it is also certainly possible that this simply represents a multifactorial encephalopathy in someone with a poor prodrome.   She has not had an ammonia checked since 11/28, and with persistently elevated LFTs, I will repeat this. Also, given her low albumin, I think that thiamine deficiency should be a consideration, will send level and start supplementation.   Recommendations: 1) ammonia, B1, B12,  2) MRI brain and cervical spine(ordered yesterday by Dr. Lorraine Lax) 3) Thiamine 100mg  daily.  4) will follow.   Roland Rack, MD [facility] [phone]  If 7pm- 7am, please page neurology on call as listed in [place].

## 2019-04-02 LAB — TYPE AND SCREEN
ABO/RH(D): O POS
Antibody Screen: NEGATIVE
Unit division: 0
Unit division: 0

## 2019-04-02 LAB — GLUCOSE, CAPILLARY
Glucose-Capillary: 130 mg/dL — ABNORMAL HIGH (ref 70–99)
Glucose-Capillary: 168 mg/dL — ABNORMAL HIGH (ref 70–99)
Glucose-Capillary: 125 mg/dL — ABNORMAL HIGH (ref 70–99)
Glucose-Capillary: 105 mg/dL — ABNORMAL HIGH (ref 70–99)
Glucose-Capillary: 137 mg/dL — ABNORMAL HIGH (ref 70–99)
Glucose-Capillary: 124 mg/dL — ABNORMAL HIGH (ref 70–99)

## 2019-04-02 LAB — BPAM RBC
Blood Product Expiration Date: 202101162359
Blood Product Expiration Date: 202101162359
ISSUE DATE / TIME: 202012182132
ISSUE DATE / TIME: 202012182132
Unit Type and Rh: 5100
Unit Type and Rh: 5100

## 2019-04-02 LAB — COMPREHENSIVE METABOLIC PANEL
ALT: 256 U/L — ABNORMAL HIGH (ref 0–44)
AST: 36 U/L (ref 15–41)
Albumin: 2.4 g/dL — ABNORMAL LOW (ref 3.5–5.0)
Alkaline Phosphatase: 79 U/L (ref 38–126)
Anion gap: 8 (ref 5–15)
BUN: 33 mg/dL — ABNORMAL HIGH (ref 6–20)
CO2: 25 mmol/L (ref 22–32)
Calcium: 8.3 mg/dL — ABNORMAL LOW (ref 8.9–10.3)
Chloride: 114 mmol/L — ABNORMAL HIGH (ref 98–111)
Creatinine, Ser: 0.96 mg/dL (ref 0.44–1.00)
GFR calc Af Amer: >60 mL/min (ref >60)
GFR calc non Af Amer: >60 mL/min (ref >60)
Glucose, Bld: 135 mg/dL — ABNORMAL HIGH (ref 70–99)
Potassium: 4 mmol/L (ref 3.5–5.1)
Sodium: 147 mmol/L — ABNORMAL HIGH (ref 135–145)
Total Bilirubin: 2 mg/dL — ABNORMAL HIGH (ref 0.3–1.2)
Total Protein: 5.1 g/dL — ABNORMAL LOW (ref 6.5–8.1)

## 2019-04-02 LAB — CBC WITH DIFFERENTIAL/PLATELET
Abs Immature Granulocytes: 0.09 10*3/uL — ABNORMAL HIGH (ref 0.00–0.07)
Basophils Absolute: 0 10*3/uL (ref 0.0–0.1)
Basophils Relative: 0 %
Eosinophils Absolute: 0.8 10*3/uL — ABNORMAL HIGH (ref 0.0–0.5)
Eosinophils Relative: 6 %
HCT: 26 % — ABNORMAL LOW (ref 36.0–46.0)
Hemoglobin: 8.4 g/dL — ABNORMAL LOW (ref 12.0–15.0)
Immature Granulocytes: 1 %
Lymphocytes Relative: 7 %
Lymphs Abs: 0.9 10*3/uL (ref 0.7–4.0)
MCH: 29.7 pg (ref 26.0–34.0)
MCHC: 32.3 g/dL (ref 30.0–36.0)
MCV: 91.9 fL (ref 80.0–100.0)
Monocytes Absolute: 0.8 10*3/uL (ref 0.1–1.0)
Monocytes Relative: 6 %
Neutro Abs: 10.1 10*3/uL — ABNORMAL HIGH (ref 1.7–7.7)
Neutrophils Relative %: 80 %
Platelets: 115 10*3/uL — ABNORMAL LOW (ref 150–400)
RBC: 2.83 MIL/uL — ABNORMAL LOW (ref 3.87–5.11)
RDW: 17.6 % — ABNORMAL HIGH (ref 11.5–15.5)
WBC: 12.6 10*3/uL — ABNORMAL HIGH (ref 4.0–10.5)
nRBC: 0.4 % — ABNORMAL HIGH (ref 0.0–0.2)

## 2019-04-02 NOTE — Progress Notes (Signed)
Occupational Therapy Treatment Patient Details Name: Rebecca Bruce MRN: [id number] DOB: 10/31/1960 Today's Date: 04/02/2019    History of present illness Pt is a 58 y.o. female admitted from group home to [facility] on 03/09/19 with AMS, poor oral intake; (+) COVID-19. Pt with progressive increase in acute encephalopathy . 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 Elevated CSF protein is compatible with but not diagnostic of a resolving viral encephalitis.  onia. Brain MRI 12/3 without acute abnormality. ETT 12/4-12/14. 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  PMH includes intellectual disability, HTN.   OT comments  Pt with intermittent agitation and yelling out, grimacing at times with ROM.  Pt did engage with music today and followed simple one step commands inconsistently with hand over hand and in functional context.   Follow Up Recommendations  SNF;Supervision/Assistance - 24 hour    Equipment Recommendations  None recommended by OT    Recommendations for Other Services      Precautions / Restrictions         Mobility Bed Mobility Overal bed mobility: Needs Assistance             General bed mobility comments: Pt very resistance to any movement at all - did place pt in partial bed chair position which pt did tolerate.  Transfers                      Balance                                           ADL either performed or assessed with clinical judgement   ADL Overall ADL's : Needs assistance/impaired Eating/Feeding: NPO   Grooming: Total assistance;Bed level;Wash/dry face;Wash/dry Nurse, mental health Details (indicate cue type and reason): With hand  over hand pt did smooth hand using L hand and cues x3                                     Vision       Perception     Praxis      Cognition Arousal/Alertness: Awake/alert Behavior During Therapy: Restless;Agitated(intermittently with attempted activity) Overall Cognitive Status: No family/caregiver present to determine baseline cognitive functioning Area of Impairment: Attention;Following commands;Awareness                   Current Attention Level: Focused   Following Commands: Follows one step commands inconsistently   Awareness: Intellectual   General Comments: Pt awake and alert. Able to engage pt verbally today as long as pt wasn't touched however speech is primarily unintelligble except for simple one words like "Yes" and "No".  Pt becomes agitated with touch, movement and yells out but does not always grimace.  Pt did respond to Christmas music today and at end of session was singing along to familiar song and clearly knew the words.        Exercises Other Exercises Other Exercises: Did attempt P/AAROM to UE's - pt initially resistant but did eventual allow BUE ROM for  elbow flexion/extension, finger  flexion/extension, limited abduction and shoulder flexion to LUE to "smooth hair" with max assist. Other Exercises: Completed AAROM for dorsi/plantar flexion BLE's - pt did intermittently yell out but tolerated. Other Exercises: Pt did spontaneously move BUE's at times   Shoulder Instructions       General Comments      Pertinent Vitals/ Pain       Pain Assessment: Faces Faces Pain Scale: Hurts little more Pain Location: with PROM to UE's Pain Descriptors / Indicators: Grimacing;Other (Comment)(yelling out, pulling away) Pain Intervention(s): Limited activity within patient's tolerance  Home Living                                          Prior Functioning/Environment              Frequency  Min 2X/week         Progress Toward Goals  OT Goals(current goals can now be found in the care plan section)  Progress towards OT goals: Progressing toward goals  Acute Rehab OT Goals Patient Stated Goal: Pt unable to state OT Goal Formulation: Patient unable to participate in goal setting Time For Goal Achievement: 04/10/19 Potential to Achieve Goals: [place] Discharge plan remains appropriate    Co-evaluation          OT goals addressed during session: Strengthening/ROM;Other (comment)(cognition/behavior)      AM-PAC OT "6 Clicks" Daily Activity     Outcome Measure   Help from another person eating meals?: Total Help from another person taking care of personal grooming?: Total Help from another person toileting, which includes using toliet, bedpan, or urinal?: Total Help from another person bathing (including washing, rinsing, drying)?: Total Help from another person to put on and taking off regular upper body clothing?: Total Help from another person to put on and taking off regular lower body clothing?: Total 6 Click Score: 6    End of Session    OT Visit Diagnosis: Unsteadiness on feet (R26.81);Other abnormalities of gait and mobility (R26.89);Muscle weakness (generalized) (M62.81);Hemiplegia and hemiparesis;Other symptoms and signs involving cognitive function;Pain Hemiplegia - Right/Left: (bilateral) Hemiplegia - caused by: Unspecified Pain - Right/Left: (all extremities with ROM)   Activity Tolerance Other (comment)(limited by cognition, behavior and agitation)   Patient Left in bed;with bed alarm set   Nurse Communication          Time: [id number] OT Time Calculation (min): 27 min  Charges: OT General Charges $OT Visit: 1 Visit OT Treatments $Self Care/Home Management : 23-37 mins    Quay Burow, OTR/L 04/02/2019, 3:00 PM

## 2019-04-02 NOTE — TOC Progression Note (Signed)
Transition of Care [facility]) - Progression Note    Patient Details  Name: Rebecca Bruce MRN: [id number] Date of Birth: 19-May-1960  Transition of Care [facility]) CM/SW [place], LCSW Phone Number: 04/02/2019, 2:50 PM  Clinical Narrative:    Patient transferred to 5W from [place]. Patient resides at [facility]. TOC team to continue to follow for discharge needs.    Expected Discharge Plan: Group Home Barriers to Discharge: Continued Medical Work up  Expected Discharge Plan and Services Expected Discharge Plan: Group Home   Discharge Planning Services: CM Consult Post Acute Care Choice: [place] arrangements for the past 2 months: Group Home                                       Social Determinants of Health (SDOH) Interventions    Readmission Risk Interventions No flowsheet data found.

## 2019-04-02 NOTE — Progress Notes (Addendum)
NAME:  Rebecca Bruce, MRN:  [id number], DOB:  [birth year], LOS: 30 ADMISSION DATE:  03/11/2019, CONSULTATION DATE:  12/4 REFERRING MD:  Sloan Leiter, CHIEF COMPLAINT:  Inability to protect airway   Brief History   58 y/o blk fem Hypothyroid, htn, intellectual debility [grp home resident for 20 yr] brought to [facility] on 11/28 in the setting of altered mental status from her group home.  She was found to have COVID pneumonia, treated with decadron and remdesivir then sent to [facility] on 11/30 - progressive increase in acute encephalopathy and has become less and less responsive.   12/4 PCCM asked to see her because she reached the point that she was aspirating and not responding to pharyngeal/tracheal suctioning.   exam findings have waxed and waned over the last three days but have not generally moved in the right direction.   Neurology saw her on 12/2 and apparently at that time she followed some simple commands, but she was non-verbal at the time with significant rigidity noted in her upper extremities.   At baseline lives in a group home, can bathe herself and have a conversation.  Has baseline anxiety and depression.  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   She is transferred from [place] [facility] 12/18 due to finding of hemoperitoneum for emergent angiogram  Past Medical History  Thyroid disease Hypertension GERD Anxiety  Depression [place] [facility] Events   11/28 Admission [facility] 11/30 Transfer to [facility] 12/04 Intubation for inability to protect airway 12/05 Diprivan, fentanyl gtt. Suctioned large plug by RT >> Peak pressure improved.  Tol SBT 12/12 Tx to [place]. Sedation decreased but put back on fentanyl drip overnight for tachycardia.  Failed SBT 12/13 PSV wean, mental status barrier to extubation 12/14: Extubated.  During the evening hours patient hypotensive.  This was after receiving propranolol  placed on Neo-Synephrine and administered albumin 12/15: phenylephrine doses increasing.  12/16: pressor demands down after volume, stress steroids, and art line placement.  12/17: Hemoglobin acutely dropped from 11.6 down to 3.9, received 2 units of blood, CT abdomen pelvis showing large new hemoperitoneum involving the left upper quadrant of the abdomen near the splenic flexure of the colon etiology was not clear.  All anticoagulation was discontinued. 12/18: Received another unit of blood overnight, hemoglobin had gone from 7.4 down to 6.9, with no bump following transfusion.  General surgery consulted.  Interventional radiology also consulted.  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  R Micro Data:  11/27 urine culture > multiple species 11/28 blood > negative 11/28 SARS COV 2 > positive 12/04 sputum > normal flora  Antimicrobials:  11/28 Vanc > 11/30, restart 12/2 > 12/7 11/28 mero >  12/2 11/29 remdesivir > 12/3 12/02 ceftriaxone > 12/7 12/04 flagyl > off  Interim history/subjective:  Coherent to some extent but starts shouting when I see  her No new issues it appears--no reported fever SLP wasn't able to see yesterday  Objective   Blood pressure (!) 163/79, pulse 88, temperature 98.4 F (36.9 C), temperature source Axillary, resp. rate (!) 26, height 5\' 2"  (1.575 m), weight 98.4 kg, SpO2 100 %.         Intake/Output Summary (Last 24 hours) at 04/02/2019 1157 Last data filed at 04/02/2019 [id number] Gross per 24 hour  Intake 1944.03 ml  Output 851 ml  Net 1093.03 ml   Filed Weights   03/31/19 0424 04/01/19 0500 04/02/19 0412  Weight: 87.6 kg 96.6 kg 98.4 kg    Examination: General: Sitting up in bed, eyes open and tracks HEENT normocephalic pale mucous membranes no JVD-mouth to the R side Cortrak in place RIJ in place Pulmonary decreased breath sounds, no rhonchi Cardiac: Regular rate and rhythm Abdomen: Soft, some distension--no grimace to deep pressure Extremities: Warm and dry Neuro: Eyes open, nonverbal , continuous movements GU: Untreated yellow  Resolved Hospital Problem list   Hypotension  Acute hypoxic respiratory failure Assessment & Plan:   Hemorrhagic shock with acute blood loss anemia w/ spontaneous hemoperitoneum.   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 Has been evaluated by neurology. Only workup remaining is for stiff person sydrome and anti-GAD antibodies are pending  Plan Supportive care--CT and CTA non revelatory--MRI has motion degradation D/w Neurology briefly and suspect encephalopathy was from illnesses superimposed on poor baseline  AKI hypokalemia Likely 2/2 ATN Plan Was not eating and was slightly hyponatremic-currently corrected-recheck but right now will stop d5 + K SLP to see  Relative adrenal dysfunction Plan Dc Stress dose  steroids  Hypothyroidism Plan Synthroid 100 mcg  Pulmonary nodule.  Left upper lobe 6 mm in size Plan/recommendation CT 6 to 12 months  Best practice:  Diet: tube feeding DVT prophylaxis: SCDs GI prophylaxis: famotidine Mobility:OOB Code Status: full Disposition: ICU --SDU--- expect can discharge over the next several days if we can discontinue feeding tube pending speech therapy evaluation-she seems closer to her baseline Family: called sister, gave brief update- 12.21  Verneita Griffes, MD [facility] 11:57 AM   04/02/2019

## 2019-04-02 NOTE — Progress Notes (Signed)
Patient received from the 3 Am. Vital signs are stable.patient alert and oriented x1. Iv in place. Skin assessment done with another nurse. Patient is on tube continuous tube feeding. Call bell in reach and bed in low position.

## 2019-04-02 NOTE — Progress Notes (Signed)
  Speech Language Pathology Treatment: Dysphagia  Patient Details Name: Rebecca Bruce MRN: [id number] DOB: 10/07/1960 Today's Date: 04/02/2019 Time: [id number] SLP Time Calculation (min) (ACUTE ONLY): 18 min  Assessment / Plan / Recommendation Clinical Impression  Rebecca Bruce awake partially reclined with large mucous bolus hanging from oral cavity. Pt reluctant to oral care but eventually allowed ST to remove and clean lips with extra time and coaxing. Noted tongue with candidia. She repeated "I want ice" and "want water" however each time spoon and cup brought to oral cavity she turned head saying "no". Hand over hand assist with cup and water was not effective. She allowed ice to be placed on anterior tongue without significant movement and it fell anteriorly and would not re attempt. Pt has a [place] for nutrition.  Pt will likely eat/drink on her terms given intellectual disability and may be difficult for Speech Pathologist to see pt during a time when she is accepting. Consider decreasing tube feed volume to promote hunger? RN's may need to attempt ice chips throughout the day to see if she will accept. SLP will follow up.    HPI HPI: 58 y.o. female with PMHx of hypothyroidism, HTN, intellectual disability-lives in a group home for the past 20 years (normally fairly independent and talks)-brought in to [facility] on 11/28 for altered mental status, poor oral intake-further evaluation revealed COVID-19 pneumonia.  Encephalopathic, leading to clinical swallow evaluation 12/01 - noted that pt visually tracked people in room; did not initiate movement or recognize/manipulate POs.  Pt was intubated 12/4-14.  Dx include severe catatonic reaction of uncertain etiology, ARF with hypoxemia.      SLP Plan          Recommendations  Diet recommendations: NPO;Other(comment)(versus allowing nurses to attempt) Medication Administration: Via alternative means                Oral Care Recommendations:  Oral care QID Follow up Recommendations: Skilled Nursing facility SLP Visit Diagnosis: Dysphagia, unspecified (R13.10)       GO                Rebecca Bruce 04/02/2019, 5:08 PM  Rebecca Bruce.Ed Risk analyst [phone] Office [phone]

## 2019-04-03 ENCOUNTER — Inpatient Hospital Stay (HOSPITAL_COMMUNITY): Payer: Medicare Other

## 2019-04-03 DIAGNOSIS — G959 Disease of spinal cord, unspecified: Secondary | ICD-10-CM

## 2019-04-03 DIAGNOSIS — R062 Wheezing: Secondary | ICD-10-CM

## 2019-04-03 LAB — CBC WITH DIFFERENTIAL/PLATELET
Abs Immature Granulocytes: 0.09 10*3/uL — ABNORMAL HIGH (ref 0.00–0.07)
Basophils Absolute: 0 10*3/uL (ref 0.0–0.1)
Basophils Relative: 0 %
Eosinophils Absolute: 0.8 10*3/uL — ABNORMAL HIGH (ref 0.0–0.5)
Eosinophils Relative: 6 %
HCT: 28.6 % — ABNORMAL LOW (ref 36.0–46.0)
Hemoglobin: 8.9 g/dL — ABNORMAL LOW (ref 12.0–15.0)
Immature Granulocytes: 1 %
Lymphocytes Relative: 7 %
Lymphs Abs: 0.9 10*3/uL (ref 0.7–4.0)
MCH: 29.3 pg (ref 26.0–34.0)
MCHC: 31.1 g/dL (ref 30.0–36.0)
MCV: 94.1 fL (ref 80.0–100.0)
Monocytes Absolute: 1 10*3/uL (ref 0.1–1.0)
Monocytes Relative: 7 %
Neutro Abs: 10.4 10*3/uL — ABNORMAL HIGH (ref 1.7–7.7)
Neutrophils Relative %: 79 %
Platelets: 151 10*3/uL (ref 150–400)
RBC: 3.04 MIL/uL — ABNORMAL LOW (ref 3.87–5.11)
RDW: 17.9 % — ABNORMAL HIGH (ref 11.5–15.5)
WBC: 13.1 10*3/uL — ABNORMAL HIGH (ref 4.0–10.5)
nRBC: 0.2 % (ref 0.0–0.2)

## 2019-04-03 LAB — GLUCOSE, CAPILLARY
Glucose-Capillary: 112 mg/dL — ABNORMAL HIGH (ref 70–99)
Glucose-Capillary: 122 mg/dL — ABNORMAL HIGH (ref 70–99)
Glucose-Capillary: 126 mg/dL — ABNORMAL HIGH (ref 70–99)
Glucose-Capillary: 126 mg/dL — ABNORMAL HIGH (ref 70–99)
Glucose-Capillary: 134 mg/dL — ABNORMAL HIGH (ref 70–99)
Glucose-Capillary: 88 mg/dL (ref 70–99)

## 2019-04-03 LAB — COMPREHENSIVE METABOLIC PANEL
ALT: 207 U/L — ABNORMAL HIGH (ref 0–44)
AST: 32 U/L (ref 15–41)
Albumin: 2.3 g/dL — ABNORMAL LOW (ref 3.5–5.0)
Alkaline Phosphatase: 76 U/L (ref 38–126)
Anion gap: 8 (ref 5–15)
BUN: 34 mg/dL — ABNORMAL HIGH (ref 6–20)
CO2: 27 mmol/L (ref 22–32)
Calcium: 8 mg/dL — ABNORMAL LOW (ref 8.9–10.3)
Chloride: 113 mmol/L — ABNORMAL HIGH (ref 98–111)
Creatinine, Ser: 0.98 mg/dL (ref 0.44–1.00)
GFR calc Af Amer: >60 mL/min (ref >60)
GFR calc non Af Amer: >60 mL/min (ref >60)
Glucose, Bld: 133 mg/dL — ABNORMAL HIGH (ref 70–99)
Potassium: 4.1 mmol/L (ref 3.5–5.1)
Sodium: 148 mmol/L — ABNORMAL HIGH (ref 135–145)
Total Bilirubin: 1.7 mg/dL — ABNORMAL HIGH (ref 0.3–1.2)
Total Protein: 5.1 g/dL — ABNORMAL LOW (ref 6.5–8.1)

## 2019-04-03 IMAGING — MR MR CERVICAL SPINE W/O CM
5 series · 48 of 48 positions shown · non-contrast
Comparison: CT angio head and neck [DATE]

CLINICAL DATA: Focal neuro deficit.  [NP] positive

EXAM:
MRI CERVICAL SPINE WITHOUT CONTRAST
TECHNIQUE: Multiplanar, multisequence MR imaging of the cervical spine was
performed. No intravenous contrast was administered.

[Series 2: T2 · sagittal · 3.0mm · 0.86mm/px · 6 of 14 slices shown (1 of 2)]
[im 1/14]
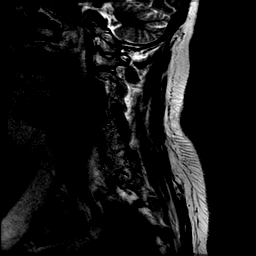
[im 3/14]
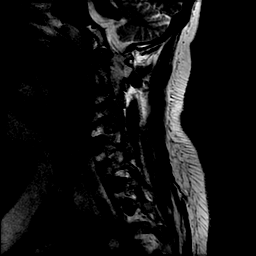
[im 6/14]
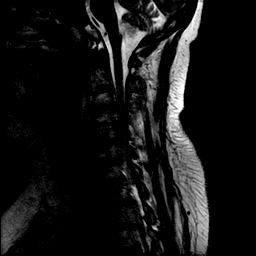
[im 8/14]
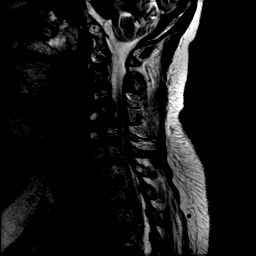
[im 11/14]
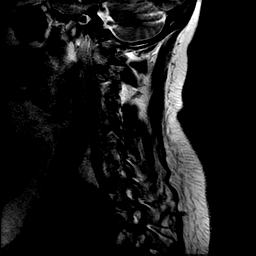
[im 14/14]
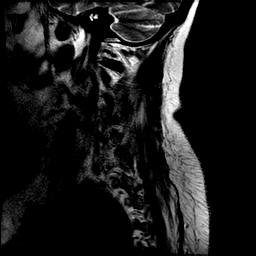

[Series 3: T1 · sagittal · 3.0mm · 0.43mm/px · 6 of 14 slices shown]
[im 1/14]
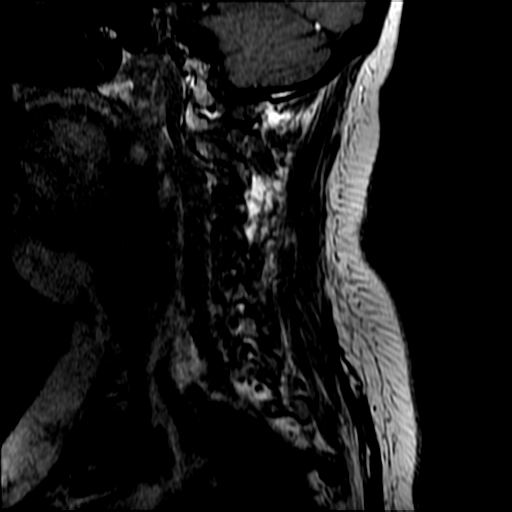
[im 3/14]
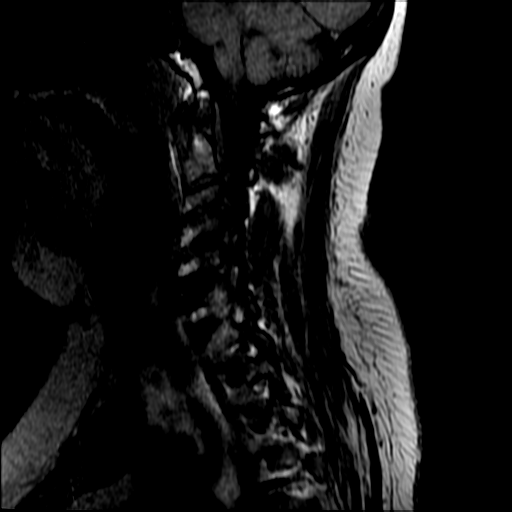
[im 6/14]
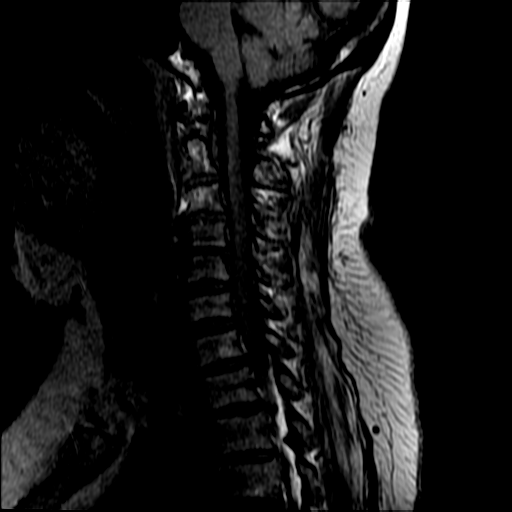
[im 8/14]
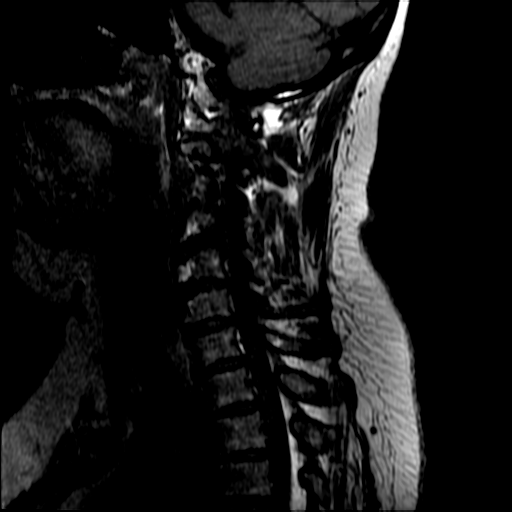
[im 11/14]
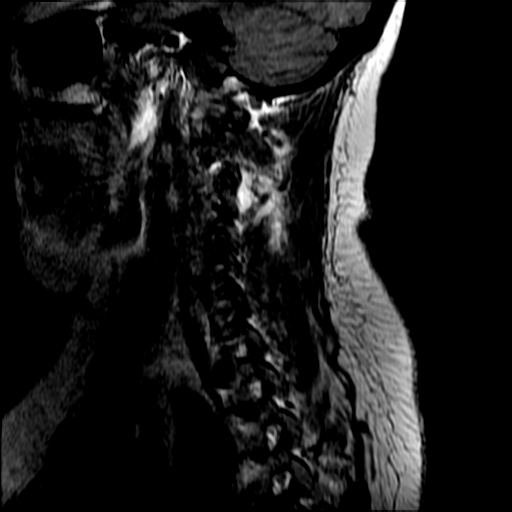
[im 14/14]
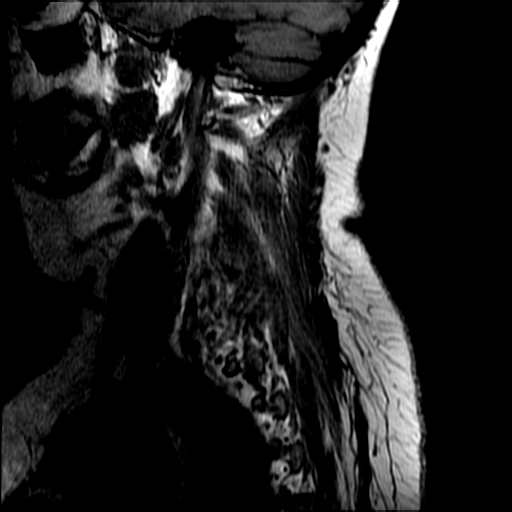

[Series 4: sag ir · sagittal · 3.0mm · 0.86mm/px · 6 of 14 slices shown]
[im 1/14]
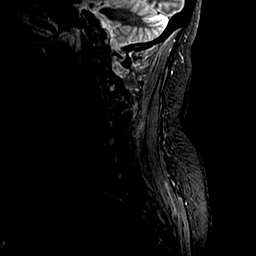
[im 3/14]
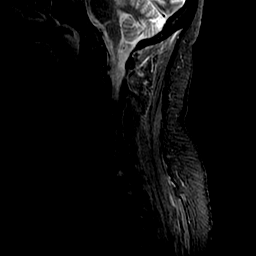
[im 6/14]
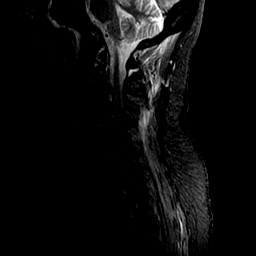
[im 8/14]
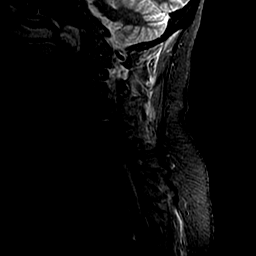
[im 11/14]
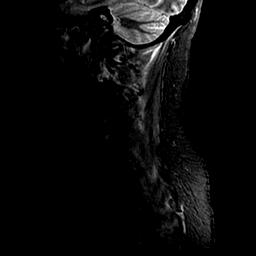
[im 14/14]
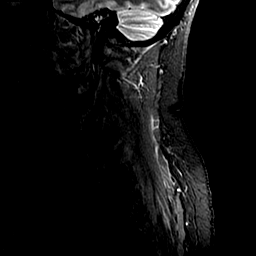

[Series 5: ax 2d merge · axial · 3.0mm · 0.70mm/px · z∈[-56,+66]mm · 15 of 37 slices shown]
[im 1/37]
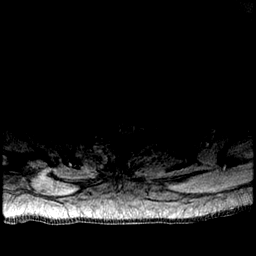
[im 3/37]
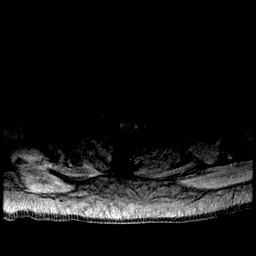
[im 6/37]
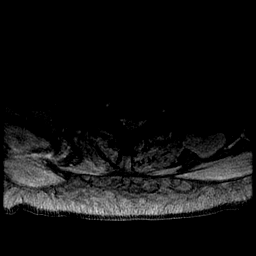
[im 8/37]
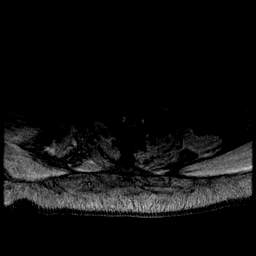
[im 11/37]
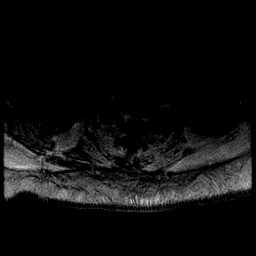
[im 13/37]
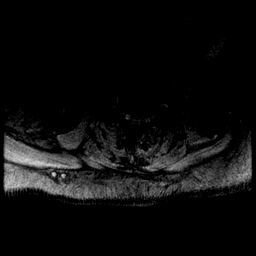
[im 16/37]
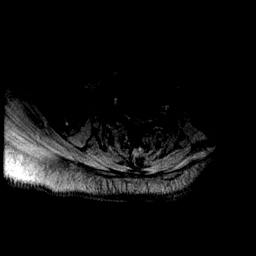
[im 19/37]
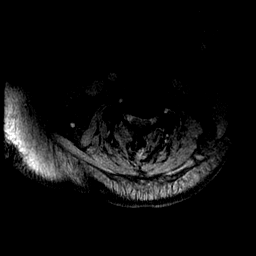
[im 21/37]
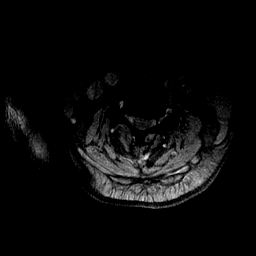
[im 24/37]
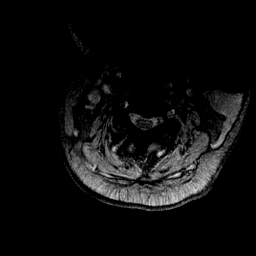
[im 26/37]
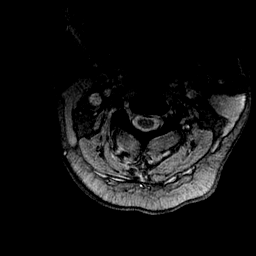
[im 29/37]
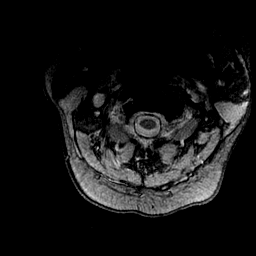
[im 31/37]
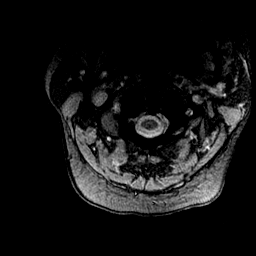
[im 34/37]
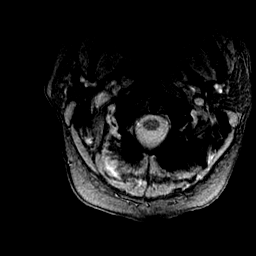
[im 37/37]
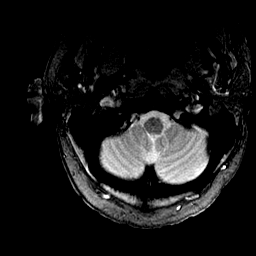

[Series 6: T2 · axial · 3.0mm · 0.70mm/px · z∈[-56,+66]mm · 15 of 37 slices shown (2 of 2)]
[im 1/37]
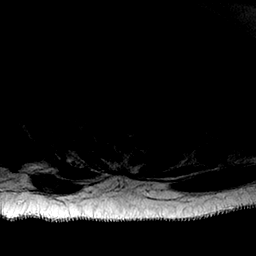
[im 3/37]
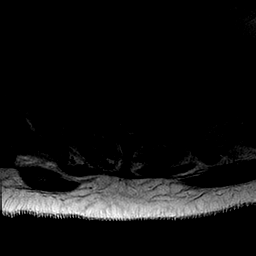
[im 6/37]
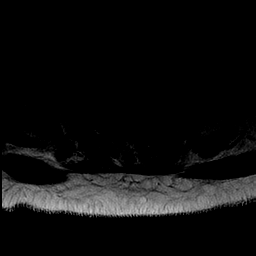
[im 8/37]
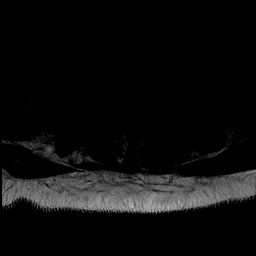
[im 11/37]
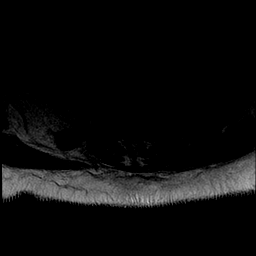
[im 13/37]
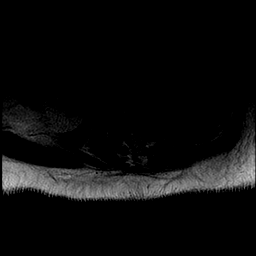
[im 16/37]
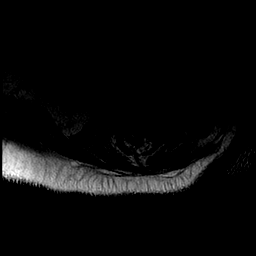
[im 19/37]
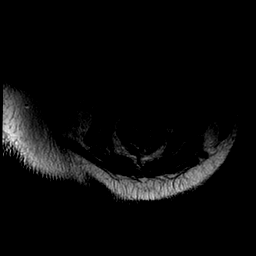
[im 21/37]
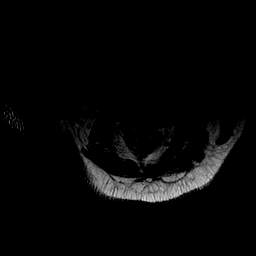
[im 24/37]
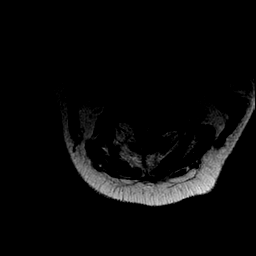
[im 26/37]
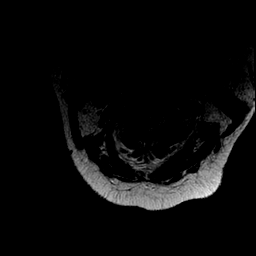
[im 29/37]
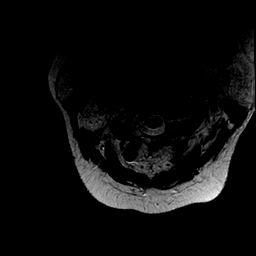
[im 31/37]
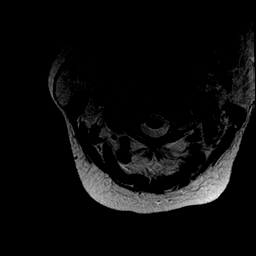
[im 34/37]
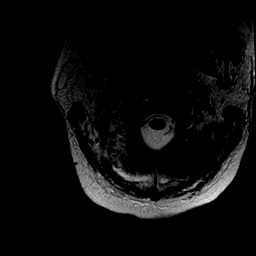
[im 37/37]
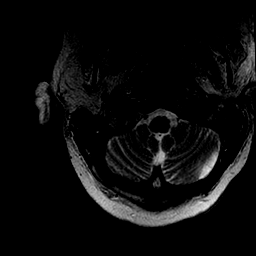

[48 of 48 positions shown; findings below may reference images not displayed]

FINDINGS: Alignment: Image quality degraded by moderate motion.

Normal alignment.

Vertebrae: Negative for fracture or mass

Cord: Limited cord evaluation due to motion.

Posterior Fossa, vertebral arteries, paraspinal tissues: Negative

Disc levels:

C2-3: Negative

C3-4: Disc degeneration and spurring causing mild spinal stenosis
and mild to moderate foraminal stenosis bilaterally.

C4-5: Disc degeneration and spondylosis. Cord flattening with
moderate spinal stenosis and moderate foraminal stenosis bilaterally

C5-6: Disc degeneration and spondylosis. Mild to moderate spinal
stenosis. Mild to moderate foraminal stenosis bilaterally.

C6-7: Disc degeneration and spondylosis. Mild spinal and foraminal
stenosis bilaterally

C7-T1: Mild disc and facet degeneration. Mild foraminal narrowing
bilaterally.
IMPRESSION: Image quality degraded by extensive motion

Multilevel spinal stenosis and foraminal stenosis as above. No acute
finding.

## 2019-04-03 MED ORDER — IPRATROPIUM-ALBUTEROL 20-100 MCG/ACT IN AERS
1.0000 | INHALATION_SPRAY | Freq: Four times a day (QID) | RESPIRATORY_TRACT | Status: DC
  Administered 2019-04-03 – 2019-04-06 (×6): 1 via RESPIRATORY_TRACT
  Filled 2019-04-03: qty 4

## 2019-04-03 MED ORDER — FREE WATER
100.0000 mL | Freq: Three times a day (TID) | Status: DC
  Administered 2019-04-03 – 2019-04-11 (×24): 100 mL

## 2019-04-03 MED ORDER — LORAZEPAM 2 MG/ML IJ SOLN
0.5000 mg | Freq: Once | INTRAMUSCULAR | Status: AC
  Administered 2019-04-03: 0.5 mg via INTRAVENOUS
  Filled 2019-04-03: qty 1

## 2019-04-03 NOTE — Progress Notes (Signed)
Physical Therapy Treatment Patient Details Name: Rebecca Bruce MRN: [id number] DOB: Sep 22, 1960 Today's Date: 04/03/2019    History of Present Illness Pt is a 58 y.o. female admitted from group home to [facility] on 03/09/19 with AMS, poor oral intake; (+) COVID-19. Pt with progressive increase in acute encephalopathy . 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 Elevated CSF protein is compatible with but not diagnostic of a resolving viral encephalitis.  onia. Brain MRI 12/3 without acute abnormality. ETT 12/4-12/14. 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  PMH includes intellectual disability, HTN.    PT Comments    Pt admitted with above diagnosis. Pt mostly non responsive today due to receiving Ativan this am prior to MRI.  Assisted nurisng in cleaning pt as she had vomited in bed prior to PT arrival. Did provide P/ROm.  Pt currently with functional limitations due to balance and endurance deficits. Pt will benefit from skilled PT to increase their independence and safety with mobility to allow discharge to the venue listed below.     Follow Up Recommendations  SNF     Equipment Recommendations  Wheelchair cushion (measurements PT);Wheelchair (measurements PT)    Recommendations for Other Services       Precautions / Restrictions Precautions Precautions: Fall Restrictions Weight Bearing Restrictions: No    Mobility  Bed Mobility Overal bed mobility: Needs Assistance Bed Mobility: Rolling Rolling: +2 for physical assistance;+2 for safety/equipment;Total assist         General bed mobility comments: Pt found with foam at mouth and had vomited in the bed.  Called nursing who came in and  told thisPT that pt had had Ativan earlier.  PT assisted nursing with cleaning pt and changing bed.  Pt did not respond to any commands.   Transfers                 General transfer comment: NA, Will need [place](Not attempted this date due to safety concerns)  Ambulation/Gait                 Stairs             Wheelchair Mobility    Modified Rankin (Stroke Patients Only)       Balance                                            Cognition Arousal/Alertness: Lethargic;Suspect due to medications(had ativan for an MRI) Behavior During Therapy: Flat affect Overall Cognitive Status: No family/caregiver present to determine baseline cognitive functioning Area of Impairment: Attention;Following commands;Awareness                                      Exercises General Exercises - Lower Extremity Ankle Circles/Pumps: PROM;Both;10 reps Heel Slides: PROM;Both;5 reps;Supine Hip ABduction/ADduction: PROM;Both;5 reps;Supine Other Exercises Other Exercises: Did attempt P/AAROM to UE's and LEs    General Comments        Pertinent Vitals/Pain Pain Assessment: No/denies pain    Home Living  Prior Function            PT Goals (current goals can now be found in the care plan section) Acute Rehab PT Goals Patient Stated Goal: Pt unable to state Progress towards PT goals: Not progressing toward goals - comment(Had Ativan prior to MRI earlier)    Frequency    Min 2X/week      PT Plan Current plan remains appropriate    Co-evaluation              AM-PAC PT "6 Clicks" Mobility   Outcome Measure  Help needed turning from your back to your side while in a flat bed without using bedrails?: Total Help needed moving from lying on your back to sitting on the side of a flat bed without using bedrails?: Total Help needed moving to and from a bed to a chair (including a wheelchair)?:  Total Help needed standing up from a chair using your arms (e.g., wheelchair or bedside chair)?: Total Help needed to walk in hospital room?: Total Help needed climbing 3-5 steps with a railing? : Total 6 Click Score: 6    End of Session   Activity Tolerance: Other (comment)(limited by resistance to movement) Patient left: in bed;with call bell/phone within reach;with nursing/sitter in room;with bed alarm set Nurse Communication: Mobility status(nurse assisted with bed mobility) PT Visit Diagnosis: Other symptoms and signs involving the nervous system (R29.898)     Time: 9629-5284 PT Time Calculation (min) (ACUTE ONLY): 22 min  Charges:  $Self Care/Home Management: 8-22                     Arthur Speagle W,PT Acute Rehabilitation Services Pager:  [phone]  Office:  [phone]     Berline Lopes 04/03/2019, 2:18 PM

## 2019-04-03 NOTE — Consult Note (Signed)
Reason for Consult: Encephalopathy, possible cervical myelopathy Referring Physician: Neurology  Rebecca Bruce is an 58 y.o. female.  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  Patient now extubated and minimally responsive.  Essentially nonverbal.  She will intermittently follow some simple commands.  Past Medical History:  Diagnosis Date  . GERD (gastroesophageal reflux disease)   . Hypertension   . Thyroid disease     Past Surgical History:  Procedure Laterality Date  . DENTAL SURGERY    . IR ANGIOGRAM SELECTIVE EACH ADDITIONAL VESSEL  03/29/2019  . IR ANGIOGRAM SELECTIVE EACH ADDITIONAL VESSEL  03/29/2019  . IR ANGIOGRAM SELECTIVE EACH ADDITIONAL VESSEL  03/29/2019  . IR ANGIOGRAM SELECTIVE EACH ADDITIONAL VESSEL  03/29/2019  . IR ANGIOGRAM VISCERAL SELECTIVE  03/29/2019  . IR ANGIOGRAM VISCERAL SELECTIVE  03/29/2019  . IR US GUIDE VASC ACCESS RIGHT  03/29/2019    Family History  Problem Relation Age of Onset  . Heart disease Mother   . Breast cancer Neg Hx     Social History:  reports that she has never smoked. She has never used smokeless tobacco. She reports that she does not drink alcohol or use drugs.  Allergies: No Known AllergiesI have reviewed the patient's current medications.  Medications:   Results for orders placed or performed during the hospital encounter of 03/11/19 (from the past 48 hour(s))  Ammonia     Status: None   Collection Time: 04/01/19  5:52 PM  Result Value Ref Range   Ammonia 13 9 - 35 umol/L    Comment: Performed at [facility], [place] [address], [place], [place] [address]  Vitamin B12     Status: Abnormal   Collection Time: 04/01/19  5:52 PM  Result Value Ref Range   Vitamin B-12 1,297 (H) 180 - 914 pg/mL    Comment: (NOTE) This assay is not validated for testing neonatal or myeloproliferative syndrome  specimens for Vitamin B12 levels. Performed at [facility], [place] [address], [place], [place] [address]   Glucose, capillary     Status: Abnormal   Collection Time: 04/01/19  7:47 PM  Result Value Ref Range   Glucose-Capillary 155 (H) 70 - 99 mg/dL  Glucose, capillary     Status: Abnormal   Collection Time: 04/01/19 11:51 PM  Result Value Ref Range   Glucose-Capillary 127 (H) 70 - 99 mg/dL  Glucose, capillary     Status: Abnormal   Collection Time: 04/02/19  1:09 AM  Result Value Ref Range   Glucose-Capillary 130 (H) 70 - 99 mg/dL   Comment 1 Document in Chart   Glucose, capillary     Status: Abnormal   Collection Time: 04/02/19  4:08 AM  Result Value Ref Range   Glucose-Capillary 168 (H) 70 - 99 mg/dL   Comment 1 Document in Chart   Glucose, capillary     Status: Abnormal   Collection Time: 04/02/19  8:01 AM  Result Value Ref Range   Glucose-Capillary 125 (H) 70 - 99 mg/dL  cdc12     Status: Abnormal   Collection Time: 04/02/19  8:17 AM  Result Value Ref Range   Sodium 147 (H) 135 - 145 mmol/L   Potassium 4.0 3.5 - 5.1 mmol/L   Chloride 114 (H) 98 - 111 mmol/L   CO2 25 22 - 32 mmol/L   Glucose, Bld 135 (H) 70 - 99 mg/dL   BUN 33 (H)  6 - 20 mg/dL   Creatinine, Ser 0.96 0.44 - 1.00 mg/dL   Calcium 8.3 (L) 8.9 - 10.3 mg/dL   Total Protein 5.1 (L) 6.5 - 8.1 g/dL   Albumin 2.4 (L) 3.5 - 5.0 g/dL   AST 36 15 - 41 U/L   ALT 256 (H) 0 - 44 U/L   Alkaline Phosphatase 79 38 - 126 U/L   Total Bilirubin 2.0 (H) 0.3 - 1.2 mg/dL   GFR calc non Af Amer >60 >60 mL/min   GFR calc Af Amer >60 >60 mL/min   Anion gap 8 5 - 15    Comment: Performed at [facility] [address], [place], [place] [address]  [id number]     Status: Abnormal   Collection Time: 04/02/19  8:17 AM  Result Value Ref Range   WBC 12.6 (H) 4.0 - 10.5 K/uL   RBC 2.83 (L) 3.87 - 5.11 MIL/uL   Hemoglobin 8.4 (L) 12.0 - 15.0 g/dL   HCT 26.0 (L) 36.0 - 46.0 %   MCV 91.9 80.0 - 100.0 fL   MCH 29.7 26.0 -  34.0 pg   MCHC 32.3 30.0 - 36.0 g/dL   RDW 17.6 (H) 11.5 - 15.5 %   Platelets 115 (L) 150 - 400 K/uL    Comment: REPEATED TO VERIFY Immature Platelet Fraction may be clinically indicated, consider ordering this additional test [id number] CONSISTENT WITH PREVIOUS RESULT    nRBC 0.4 (H) 0.0 - 0.2 %   Neutrophils Relative % 80 %   Neutro Abs 10.1 (H) 1.7 - 7.7 K/uL   Lymphocytes Relative 7 %   Lymphs Abs 0.9 0.7 - 4.0 K/uL   Monocytes Relative 6 %   Monocytes Absolute 0.8 0.1 - 1.0 K/uL   Eosinophils Relative 6 %   Eosinophils Absolute 0.8 (H) 0.0 - 0.5 K/uL   Basophils Relative 0 %   Basophils Absolute 0.0 0.0 - 0.1 K/uL   Immature Granulocytes 1 %   Abs Immature Granulocytes 0.09 (H) 0.00 - 0.07 K/uL    Comment: Performed at [facility] [address] [place], [place] [address]  Glucose, capillary     Status: Abnormal   Collection Time: 04/02/19 12:13 PM  Result Value Ref Range   Glucose-Capillary 105 (H) 70 - 99 mg/dL  Glucose, capillary     Status: Abnormal   Collection Time: 04/02/19  5:26 PM  Result Value Ref Range   Glucose-Capillary 137 (H) 70 - 99 mg/dL  Glucose, capillary     Status: Abnormal   Collection Time: 04/02/19  8:01 PM  Result Value Ref Range   Glucose-Capillary 124 (H) 70 - 99 mg/dL  Glucose, capillary     Status: Abnormal   Collection Time: 04/03/19 12:31 AM  Result Value Ref Range   Glucose-Capillary 122 (H) 70 - 99 mg/dL  Glucose, capillary     Status: Abnormal   Collection Time: 04/03/19  4:08 AM  Result Value Ref Range   Glucose-Capillary 126 (H) 70 - 99 mg/dL  cdc12     Status: Abnormal   Collection Time: 04/03/19  5:34 AM  Result Value Ref Range   Sodium 148 (H) 135 - 145 mmol/L   Potassium 4.1 3.5 - 5.1 mmol/L   Chloride 113 (H) 98 - 111 mmol/L   CO2 27 22 - 32 mmol/L   Glucose, Bld 133 (H) 70 - 99 mg/dL   BUN 34 (H) 6 - 20 mg/dL   Creatinine, Ser 0.98 0.44 -  1.00 mg/dL   Calcium 8.0 (L) 8.9 - 10.3 mg/dL   Total Protein 5.1 (L)  6.5 - 8.1 g/dL   Albumin 2.3 (L) 3.5 - 5.0 g/dL   AST 32 15 - 41 U/L   ALT 207 (H) 0 - 44 U/L   Alkaline Phosphatase 76 38 - 126 U/L   Total Bilirubin 1.7 (H) 0.3 - 1.2 mg/dL   GFR calc non Af Amer >60 >60 mL/min   GFR calc Af Amer >60 >60 mL/min   Anion gap 8 5 - 15    Comment: Performed at [facility] [address], [place], [place] [address]  3dcbc     Status: Abnormal   Collection Time: 04/03/19  5:34 AM  Result Value Ref Range   WBC 13.1 (H) 4.0 - 10.5 K/uL   RBC 3.04 (L) 3.87 - 5.11 MIL/uL   Hemoglobin 8.9 (L) 12.0 - 15.0 g/dL   HCT 28.6 (L) 36.0 - 46.0 %   MCV 94.1 80.0 - 100.0 fL   MCH 29.3 26.0 - 34.0 pg   MCHC 31.1 30.0 - 36.0 g/dL   RDW 17.9 (H) 11.5 - 15.5 %   Platelets 151 150 - 400 K/uL   nRBC 0.2 0.0 - 0.2 %   Neutrophils Relative % 79 %   Neutro Abs 10.4 (H) 1.7 - 7.7 K/uL   Lymphocytes Relative 7 %   Lymphs Abs 0.9 0.7 - 4.0 K/uL   Monocytes Relative 7 %   Monocytes Absolute 1.0 0.1 - 1.0 K/uL   Eosinophils Relative 6 %   Eosinophils Absolute 0.8 (H) 0.0 - 0.5 K/uL   Basophils Relative 0 %   Basophils Absolute 0.0 0.0 - 0.1 K/uL   Immature Granulocytes 1 %   Abs Immature Granulocytes 0.09 (H) 0.00 - 0.07 K/uL    Comment: Performed at [facility], [address], [place], [place] [address]  Glucose, capillary     Status: Abnormal   Collection Time: 04/03/19  7:47 AM  Result Value Ref Range   Glucose-Capillary 126 (H) 70 - 99 mg/dL  Glucose, capillary     Status: None   Collection Time: 04/03/19 12:26 PM  Result Value Ref Range   Glucose-Capillary 88 70 - 99 mg/dL    MR CERVICAL SPINE WO CONTRAST  Result Date: 04/03/2019 CLINICAL DATA:  Focal neuro deficit.  COVID-19 positive EXAM: MRI CERVICAL SPINE WITHOUT CONTRAST TECHNIQUE: Multiplanar, multisequence MR imaging of the cervical spine was performed. No intravenous contrast was administered. COMPARISON:  CT angio head and neck 03/31/2019 FINDINGS: Alignment: Image quality degraded by  moderate motion. Normal alignment. Vertebrae: Negative for fracture or mass Cord: Limited cord evaluation due to motion. Posterior Fossa, vertebral arteries, paraspinal tissues: Negative Disc levels: C2-3: Negative C3-4: Disc degeneration and spurring causing mild spinal stenosis and mild to moderate foraminal stenosis bilaterally. C4-5: Disc degeneration and spondylosis. Cord flattening with moderate spinal stenosis and moderate foraminal stenosis bilaterally C5-6: Disc degeneration and spondylosis. Mild to moderate spinal stenosis. Mild to moderate foraminal stenosis bilaterally. C6-7: Disc degeneration and spondylosis. Mild spinal and foraminal stenosis bilaterally C7-T1: Mild disc and facet degeneration. Mild foraminal narrowing bilaterally. IMPRESSION: Image quality degraded by extensive motion Multilevel spinal stenosis and foraminal stenosis as above. No acute finding. Electronically Signed   By: Franchot Gallo M.D.   On: 04/03/2019 11:12    Review of systems not obtained due to patient factors. Blood pressure (!) 165/80, pulse 95, temperature 98.7 F (37.1 C), temperature  source Axillary, resp. rate (!) 24, height 5\' 2"  (1.575 m), weight 101.4 kg, SpO2 97 %. Patient awakens to voice.  She appears aware.  She makes no effort to verbalize although she does moan to noxious stimuli.  Pupils are equal symmetric and reactive bilaterally.  Gaze is conjugate.  Grimaces symmetrically to noxious stimuli.  Examination of her extremities reveals significant rigidity in her upper extremities.  She withdraws and flexes somewhat but remains somewhat rigid throughout.  I cannot appreciate any significant attempts to grip or fine motor movements in her hands.  Lower extremities are also rigid.  Minimal withdrawal to noxious stimuli.  Patient not hyperreflexic.  No Hoffmann's responses in her hands.  Assessment/Plan: I reviewed the patient's MRI scan of her cervical spine.  This demonstrates evidence of significant  multilevel cervical disc degeneration with associated spondylosis and moderately severe multilevel spinal stenosis.  No evidence of any acute ongoing processes.  No evidence of signal abnormality within the cord although the study is somewhat motion degraded.  The patient certainly has evidence of significant cervical stenosis and very well may have some degree of cervical myelopathy however I believe that her chief underlying problem is significant encephalopathy probably secondary to her chronic illness and perfusion status.  At this point I do not see any benefit to consider any type of surgical decompression of her cervical spine.  I think that she is best served with prolonged convalescence in a skilled nursing facility.  Should her mental status recover to the point where she can be more fully assessed then I think we would like to see her back in the office for evaluation for possible spinal decompression.  Cooper Render Min Tunnell 04/03/2019, 4:27 PM

## 2019-04-03 NOTE — Progress Notes (Signed)
NEUROLOGY PROGRESS NOTE  Subjective: Continues to moan.  Exam: Vitals:   04/03/19 0000 04/03/19 0625  BP: 138/80 (!) 161/85  Pulse: 83 80  Resp: (!) 25 (!) 22  Temp:  100 F (37.8 C)  SpO2: 100% 100%    ROS Unable to obtain secondary to mental status   Physical Exam  General: NAD Head: Normocephalic.  Respiratory: Effort normal, non-labored breathing GI: Soft.  No distension. There is no tenderness.  Skin: WDI   Neuro:  Mental Status: Awake, winces to pain, moaning, follows commands after repeated prompting. Cranial Nerves: II: Blinks to threat bilaterally III,IV, VI: Patient clenching eyes extremely tight and would not let me examine V,VII: Winces to pain symmetrically  Motor: Increased tone in bilateral upper extremities, appears to have distal >proximal weakness, but cooperation is unclear  Sensory: Winces to noxious stimuli in all 4 extremities   Medications:  Scheduled: . Chlorhexidine Gluconate Cloth  6 each Topical Daily  . docusate  100 mg Per Tube Daily  . feeding supplement (PRO-STAT SUGAR FREE 64)  30 mL Per Tube BID  . guaiFENesin  5 mL Per Tube BID  . insulin aspart  0-15 Units Subcutaneous Q4H  . levothyroxine  100 mcg Per Tube Q0600  . sodium chloride flush  10-40 mL Intracatheter Q12H  . thiamine  100 mg Oral Daily  . vitamin C  500 mg Per Tube Daily  . zinc sulfate  220 mg Per Tube Daily   Continuous: . sodium chloride Stopped (03/12/19 1210)  . feeding supplement (OSMOLITE 1.5 CAL) 1,000 mL (04/03/19 0453)   [id number] chloride, acetaminophen (TYLENOL) oral liquid 160 mg/5 mL, ipratropium-albuterol, sodium chloride flush, white petrolatum  Pertinent Labs/Diagnostics: WBC 13.1 Platelets 151 Sodium 148 BUN 34 Creatinine 0.98 ALT 207 AST 32 B12 1297  12/03 EEG > mod to severe diffuse encephalopathy, non-specific etiology but could be due to toxic metabolic causes  MR BRAIN WO CONTRAST  Result Date: 04/01/2019 FINDINGS: Brain:  Image quality degraded by moderate motion. Negative for acute infarct. Normal white matter. Negative for hemorrhage or mass. Ventricle size normal. Vascular: Normal arterial flow voids Skull and upper cervical spine: Negative Sinuses/Orbits: Negative Other: None IMPRESSION: Image quality degraded by moderate motion. No acute abnormality. Electronically Signed   By: Franchot Gallo M.D.   On: 04/01/2019 16:00   Assessment:  As noted prior this is a 58 year old female with baseline MR and persistent encephalopathy in the setting of multiple medical comorbidities -(malnutrition, hemoperitoneum, Covid infection, prolonged ICU stay).  This point MRI does not show extensive progression of white matter disease.  As noted prior this very well could simply be represent multifactorial(including COVID) encephalopathy in someone with poor prodrome.  B12 is elevated at 1297.  Ammonia is within normal limits at 13 and thiamine is still pending.  Repeat ALT does show trending down from 469 down to 256.   Recommendations: -Awaiting MRI of cervical spine secondary to neck stiffness -Continue thiamine for possible deficiency related to malnutrition -Continue to treat infectious processes  Etta Quill PA-C [facility] [phone] 04/03/2019, 10:14 AM  I have seen the patient and reviewed the above note.  On my exam, she is following commands, though it takes some repeated prompting I feel that she "shuts down" and will stop if she becomes frustrated.  She does both push and pull proximally in bilateral arms with what appears to be a more distal weakness.  She has increased tone in bilateral arms.  She does not follow  commands in her lower extremities, flexion versus withdrawal bilateral legs.  Her MRI does show fairly severe cervical stenosis which could be playing a role in her weakness/increased tone, and I have asked for neurosurgical opinion.  There is no convincing evidence for CNS vasculitis at  this time.   At this point, I do not think I have many more avenues of evaluation, I think supportive care to allow her to recover from a Covid encephalopathy would be the next step.  Roland Rack, MD [facility] [phone]  If 7pm- 7am, please page neurology on call as listed in [place].

## 2019-04-03 NOTE — Progress Notes (Signed)
PROGRESS NOTE    Rebecca Bruce    Code Status: Full Code  [id number] DOB: September 13, 1960 DOA: 03/11/2019  PCP: Steele Sizer, MD    Hospital Summary  58 y/o female with history of hypothyroid, htn, intellectual debility [grp home resident for 20 yr] brought to [facility] on 11/28 in the setting of altered mental status from her group home.  She was found to have COVID pneumonia, treated with decadron and remdesivir then sent to [facility] on 11/30 - progressive increase in acute encephalopathy and has become less and less responsive.   12/4 PCCM asked to see her because she reached the point that she was aspirating and not responding to pharyngeal/tracheal suctioning.   exam findings have waxed and waned over the last three days but have not generally moved in the right direction.   Neurology saw her on 12/2 and apparently at that time she followed some simple commands, but she was non-verbal at the time with significant rigidity noted in her upper extremities.   At baseline lives in a group home, can bathe herself and have a conversation.  Has baseline anxiety and depression.  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   She is transferred from [facility] 12/18 due to finding of hemoperitoneum for emergent angiogram  Significant events: 11/28 Admission [facility] 11/30 Transfer to [facility] 12/04 Intubation for inability to protect airway 12/05 Diprivan, fentanyl gtt. Suctioned large plug by RT >> Peak pressure improved.  Tol SBT 12/12 Tx to [place]. Sedation decreased but put back on fentanyl drip overnight for tachycardia.  Failed SBT 12/13 PSV wean, mental status barrier to extubation 12/14: Extubated.  During the evening hours patient hypotensive.  This was after receiving propranolol placed on Neo-Synephrine and administered albumin 12/15: phenylephrine doses increasing.  12/16: pressor demands down after volume, stress steroids, and  art line placement.  12/17: Hemoglobin acutely dropped from 11.6 down to 3.9, received 2 units of blood, CT abdomen pelvis showing large new hemoperitoneum involving the left upper quadrant of the abdomen near the splenic flexure of the colon etiology was not clear.  All anticoagulation was discontinued. 12/18: Received another unit of blood overnight, hemoglobin had gone from 7.4 down to 6.9, with no bump following transfusion.  General surgery consulted.  Interventional radiology also consulted.  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  Hemoglobin stable at 8.9.   -Continue trickle feeds -Monitor hemoglobin and transfuse if <7.0  COVID-19 pneumonia extubated 12/14, currently in aspiration risk.  Intermittently on nasal cannula and room air.  Completed dexamethasone and remdesivir and recent treatment of Solu-Medrol -Aspiration and contact/airborne precautions  Acute encephalopathy of unknown etiology, likely exacerbated by COVID-19 with catatonia with history of mental retardation no evidence of CNS vasculitis at this time.  Has been evaluated by neurology -Neurology: Supportive care and allowed to recover from Covid encephalopathy  Severe multilevel cervical disc degeneration and stenosis noted on MRI cervical spine today.  Neurology consulted neurosurgery.  May have some degree of  cervical myelopathy but underlying problem is significant encephalopathy probably from chronic illness and perfusion status -Per neurosurgery: No surgical intervention at this time however if mental status improves/recovers  she could be seen outpatient for possible spinal decompression  Hypernatremia -Add free water flush to tube feeds  AKI resolved  Hypokalemia resolved  Wheeze -Combivent  Elevated LFTs likely from shock liver nearly resolved.  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  Briefly arousable to verbal stimuli but falls back asleep.  Unable to provide history  Objective   Vitals:   04/02/19 2003 04/03/19 0000 04/03/19 0625 04/03/19 1347  BP: (!) 105/92 138/80 (!) 161/85 (!) 165/80  Pulse: 78 83 80 95  Resp: (!) 23 (!) 25 (!) 22 (!) 24  Temp: 99.3 F (37.4 C)  100 F (37.8 C) 98.7 F (37.1 C)  TempSrc: Oral  Axillary Axillary  SpO2: 90% 100% 100% 97%  Weight:   101.4 kg   Height:        Intake/Output Summary (Last 24 hours) at 04/03/2019 1801 Last data filed at 04/03/2019 [id number] Gross per 24 hour  Intake 550 ml  Output 553 ml  Net -3 ml   Filed Weights   04/01/19 0500 04/02/19 0412 04/03/19 0625  Weight: 96.6 kg 98.4 kg 101.4 kg    Examination:  Physical Exam Vitals and nursing note reviewed.  Constitutional:      Comments: Somnolent  HENT:     Head:     Comments: NG tube    Mouth/Throat:      Mouth: Mucous membranes are dry.  Cardiovascular:     Rate and Rhythm: Normal rate and regular rhythm.  Pulmonary:     Effort: No respiratory distress.     Breath sounds: Wheezing present.  Abdominal:     General: Abdomen is flat.     Palpations: Abdomen is soft.  Musculoskeletal:     Right lower leg: No edema.     Left lower leg: No edema.  Skin:    General: Skin is warm.     Coloration: Skin is not jaundiced.  Neurological:     Mental Status: She is disoriented.     Data Reviewed: I have personally reviewed following labs and imaging studies  CBC: Recent Labs  Lab 03/30/19 2209 03/31/19 0417 04/01/19 0327 04/02/19 0817 04/03/19 0534  WBC 18.8* 16.0* 14.0* 12.6* 13.1*  NEUTROABS  --   --  11.3* 10.1* 10.4*  HGB 7.4* 7.3* 7.8* 8.4* 8.9*  HCT 22.5* 21.7* 24.1* 26.0* 28.6*  MCV 90.7 91.2 93.4 91.9 94.1  PLT 96* 92* 94* 115* [id number]   Basic Metabolic Panel: Recent Labs  Lab 03/28/19 0655 03/30/19 0357 03/31/19 0417 04/01/19 0327 04/02/19 0817 04/03/19 0534  NA 140 144 149* 145 147*  148*  K 3.4* 3.4* 3.0* 2.9* 4.0 4.1  CL 108 110 113* 111 114* 113*  CO2 21* 23 25 25 25 27   GLUCOSE 244* 117* 125* 133* 135* 133*  BUN 73* 67* 55* 42* 33* 34*  CREATININE 1.41* 1.35* 1.24* 1.16* 0.96 0.98  CALCIUM 7.5* 8.3* 8.4* 8.3* 8.3* 8.0*  MG 2.3 2.4  --   --   --   --    GFR: Estimated Creatinine Clearance: 70.6 mL/min (by C-G formula based on SCr of 0.98 mg/dL). Liver Function Tests: Recent Labs  Lab 03/29/19 1015 03/31/19 0417 04/01/19 0327 04/02/19 0817 04/03/19 0534  AST 212* 76* 51* 36 32  ALT 1,030* 469* 364* 256* 207*  ALKPHOS 68 62 59 79 76  BILITOT 1.1 1.9* 1.6* 2.0* 1.7*  PROT 4.8* 5.1* 4.9* 5.1* 5.1*  ALBUMIN 2.2* 2.6* 2.6* 2.4* 2.3*   No results for input(s): LIPASE, AMYLASE in the last 168 hours. Recent Labs  Lab 04/01/19 1752  AMMONIA 13   Coagulation Profile: Recent Labs  Lab 03/28/19 1100  INR 1.3*   Cardiac Enzymes: No results for  input(s): CKTOTAL, CKMB, CKMBINDEX, TROPONINI in the last 168 hours. BNP (last 3 results) No results for input(s): PROBNP in the last 8760 hours. HbA1C: No results for input(s): HGBA1C in the last 72 hours. CBG: Recent Labs  Lab 04/03/19 0031 04/03/19 0408 04/03/19 0747 04/03/19 1226 04/03/19 1701  GLUCAP 122* 126* 126* 88 134*   Lipid Profile: No results for input(s): CHOL, HDL, LDLCALC, TRIG, CHOLHDL, LDLDIRECT in the last 72 hours. Thyroid Function Tests: No results for input(s): TSH, T4TOTAL, FREET4, T3FREE, THYROIDAB in the last 72 hours. Anemia Panel: Recent Labs    04/01/19 1752  VITAMINB12 1,297*   Sepsis Labs: No results for input(s): PROCALCITON, LATICACIDVEN in the last 168 hours.  No results found for this or any previous visit (from the past 240 hour(s)).       Radiology Studies: MR CERVICAL SPINE WO CONTRAST  Result Date: 04/03/2019 CLINICAL DATA:  Focal neuro deficit.  COVID-19 positive EXAM: MRI CERVICAL SPINE WITHOUT CONTRAST TECHNIQUE: Multiplanar, multisequence MR imaging of the cervical spine was performed. No intravenous contrast was administered. COMPARISON:  CT angio head and neck 03/31/2019 FINDINGS: Alignment: Image quality degraded by moderate motion. Normal alignment. Vertebrae: Negative for fracture or mass Cord: Limited cord evaluation due to motion. Posterior Fossa, vertebral arteries, paraspinal tissues: Negative Disc levels: C2-3: Negative C3-4: Disc degeneration and spurring causing mild spinal stenosis and mild to moderate foraminal stenosis bilaterally. C4-5: Disc degeneration and spondylosis. Cord flattening with moderate spinal stenosis and moderate foraminal stenosis bilaterally C5-6: Disc degeneration and spondylosis. Mild to moderate spinal stenosis. Mild to moderate foraminal stenosis bilaterally. C6-7: Disc degeneration and spondylosis. Mild spinal and foraminal stenosis bilaterally C7-T1: Mild disc and facet degeneration. Mild foraminal  narrowing bilaterally. IMPRESSION: Image quality degraded by extensive motion Multilevel spinal stenosis and foraminal stenosis as above. No acute finding. Electronically Signed   By: Franchot Gallo M.D.   On: 04/03/2019 11:12        Scheduled Meds: . Chlorhexidine Gluconate Cloth  6 each Topical Daily  . docusate  100 mg Per Tube Daily  . feeding supplement (PRO-STAT SUGAR FREE 64)  30 mL Per Tube BID  . free water  100 mL Per Tube Q8H  . guaiFENesin  5 mL Per Tube BID  . insulin aspart  0-15 Units Subcutaneous Q4H  . Ipratropium-Albuterol  1 puff Inhalation Q6H  . levothyroxine  100 mcg Per Tube Q0600  . sodium chloride flush  10-40 mL Intracatheter Q12H  . thiamine  100 mg Oral Daily  . vitamin C  500 mg Per Tube Daily  . zinc sulfate  220 mg Per Tube Daily   Continuous Infusions: . sodium chloride Stopped (03/12/19 1210)  . feeding supplement (OSMOLITE 1.5 CAL) 1,000 mL (04/03/19 0453)     LOS: 23 days    Time spent: 30 minutes with over 50% of the time coordinating the patient's care    Harold Hedge, DO [facility] Pager [phone]  If 7PM-7AM, please contact night-coverage [email or web address] Password [email or web address] 04/03/2019, 6:01 PM

## 2019-04-04 LAB — COMPREHENSIVE METABOLIC PANEL
ALT: 168 U/L — ABNORMAL HIGH (ref 0–44)
AST: 32 U/L (ref 15–41)
Albumin: 2.4 g/dL — ABNORMAL LOW (ref 3.5–5.0)
Alkaline Phosphatase: 84 U/L (ref 38–126)
Anion gap: 10 (ref 5–15)
BUN: 33 mg/dL — ABNORMAL HIGH (ref 6–20)
CO2: 28 mmol/L (ref 22–32)
Calcium: 8.4 mg/dL — ABNORMAL LOW (ref 8.9–10.3)
Chloride: 107 mmol/L (ref 98–111)
Creatinine, Ser: 0.98 mg/dL (ref 0.44–1.00)
GFR calc Af Amer: >60 mL/min (ref >60)
GFR calc non Af Amer: >60 mL/min (ref >60)
Glucose, Bld: 135 mg/dL — ABNORMAL HIGH (ref 70–99)
Potassium: 3.9 mmol/L (ref 3.5–5.1)
Sodium: 145 mmol/L (ref 135–145)
Total Bilirubin: 1.1 mg/dL (ref 0.3–1.2)
Total Protein: 5.5 g/dL — ABNORMAL LOW (ref 6.5–8.1)

## 2019-04-04 LAB — CBC
HCT: 31.1 % — ABNORMAL LOW (ref 36.0–46.0)
Hemoglobin: 9.8 g/dL — ABNORMAL LOW (ref 12.0–15.0)
MCH: 29.5 pg (ref 26.0–34.0)
MCHC: 31.5 g/dL (ref 30.0–36.0)
MCV: 93.7 fL (ref 80.0–100.0)
Platelets: 197 10*3/uL (ref 150–400)
RBC: 3.32 MIL/uL — ABNORMAL LOW (ref 3.87–5.11)
RDW: 17.7 % — ABNORMAL HIGH (ref 11.5–15.5)
WBC: 13 10*3/uL — ABNORMAL HIGH (ref 4.0–10.5)
nRBC: 0 % (ref 0.0–0.2)

## 2019-04-04 LAB — GLUCOSE, CAPILLARY
Glucose-Capillary: 107 mg/dL — ABNORMAL HIGH (ref 70–99)
Glucose-Capillary: 113 mg/dL — ABNORMAL HIGH (ref 70–99)
Glucose-Capillary: 123 mg/dL — ABNORMAL HIGH (ref 70–99)
Glucose-Capillary: 124 mg/dL — ABNORMAL HIGH (ref 70–99)
Glucose-Capillary: 130 mg/dL — ABNORMAL HIGH (ref 70–99)
Glucose-Capillary: 132 mg/dL — ABNORMAL HIGH (ref 70–99)
Glucose-Capillary: 134 mg/dL — ABNORMAL HIGH (ref 70–99)

## 2019-04-04 MED ORDER — RESOURCE THICKENUP CLEAR PO POWD
ORAL | Status: DC | PRN
  Filled 2019-04-04: qty 125

## 2019-04-04 MED ORDER — ORAL CARE MOUTH RINSE
15.0000 mL | Freq: Two times a day (BID) | OROMUCOSAL | Status: DC
  Administered 2019-04-04 – 2019-04-19 (×17): 15 mL via OROMUCOSAL

## 2019-04-04 MED ORDER — CHLORHEXIDINE GLUCONATE 0.12 % MT SOLN
15.0000 mL | Freq: Two times a day (BID) | OROMUCOSAL | Status: DC
  Administered 2019-04-04 – 2019-04-19 (×29): 15 mL via OROMUCOSAL
  Filled 2019-04-04 (×29): qty 15

## 2019-04-04 NOTE — Progress Notes (Signed)
Subjective: She is doing better today  Exam: Vitals:   04/04/19 1157 04/04/19 1652  BP: (!) 151/84   Pulse:    Resp: 20 (!) 22  Temp: 98.7 F (37.1 C) 98.7 F (37.1 C)  SpO2: 98%    Gen: In bed, NAD Resp: non-labored breathing, no acute distress Abd: soft, nt  Neuro: MS: Awake, she is talking today, cursing me and telling me to get away.  She does not follow commands until I tell her that I will leave her alone if she sticks out her tongue to which she probably sticks out her tongue. CN: Blinks to threat bilaterally, PERRL, EOMI Motor: she actively resists movement, she does move all extremities at least some, but appears to have weakness.  Note is much increased tone today. Sensory:she winces to pain in all extremities.   Pertinent Labs: WBC 14 Cr 1.16 Albumin 2.6  TSH from 11/28 3.24  Impression: 58 yo F with baseline MR and persistent encephalopathy in the setting of multiple medical comorbidities(malnutrition, hemoperitoneum, covid infection, prolonged ICU stay). The possibility of friable vessels from covid vasculitis was raised on her systemic angiogram and a small vessel vasculitis could certainly play a role in her current encephalopathy, but I do not yet think we have definite evidence of this, and she already received Decadron.  I think that it is also certainly possible that this simply represents a multifactorial encephalopathy in someone with a poor prodrome, at this time I think that really our only option is to continue to give her supportive care and see how she does over the next few weeks to months.  I appreciate neurosurgical evaluation.  Though it is possible that her cervical stenosis coupled with prolonged immobility from intubation may have played some role in her current weakness, it is difficult to say this with certainty and I agree with Dr. Trenton Gammon with seeing how she does.  Recommendations: 1) if she were to have recurrent worsening mental status, I  would check an ABG to check for hypercarbia 2) neurology will be available on an as needed basis.  Roland Rack, MD [facility] [phone]  If 7pm- 7am, please page neurology on call as listed in [place].

## 2019-04-04 NOTE — Progress Notes (Signed)
Call patient's sister back and updated her [name] on patient status. Will continue to monitor patient.

## 2019-04-04 NOTE — Progress Notes (Signed)
PROGRESS NOTE    Rebecca Bruce    Code Status: Full Code  [id number] DOB: 1961-02-10 DOA: 03/11/2019  PCP: Steele Sizer, MD    Hospital Summary  58 y/o female with history of hypothyroid, htn, intellectual debility [grp home resident for 20 yr] brought to [facility] on 11/28 in the setting of altered mental status from her group home.  She was found to have COVID pneumonia, treated with decadron and remdesivir then sent to [facility] on 11/30 - progressive increase in acute encephalopathy and has become less and less responsive.   12/4 PCCM asked to see her because she reached the point that she was aspirating and not responding to pharyngeal/tracheal suctioning.   exam findings have waxed and waned over the last three days but have not generally moved in the right direction.   Neurology saw her on 12/2 and apparently at that time she followed some simple commands, but she was non-verbal at the time with significant rigidity noted in her upper extremities.   At baseline lives in a group home, can bathe herself and have a conversation.  Has baseline anxiety and depression.  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   She is transferred from [facility] 12/18 due to finding of hemoperitoneum for emergent angiogram  Significant events: 11/28 Admission [facility] 11/30 Transfer to [facility] 12/04 Intubation for inability to protect airway 12/05 Diprivan, fentanyl gtt. Suctioned large plug by RT >> Peak pressure improved.  Tol SBT 12/12 Tx to [place]. Sedation decreased but put back on fentanyl drip overnight for tachycardia.  Failed SBT 12/13 PSV wean, mental status barrier to extubation 12/14: Extubated.  During the evening hours patient hypotensive.  This was after receiving propranolol placed on Neo-Synephrine and administered albumin 12/15: phenylephrine doses increasing.  12/16: pressor demands down after volume, stress steroids, and  art line placement.  12/17: Hemoglobin acutely dropped from 11.6 down to 3.9, received 2 units of blood, CT abdomen pelvis showing large new hemoperitoneum involving the left upper quadrant of the abdomen near the splenic flexure of the colon etiology was not clear.  All anticoagulation was discontinued. 12/18: Received another unit of blood overnight, hemoglobin had gone from 7.4 down to 6.9, with no bump following transfusion.  General surgery consulted.  Interventional radiology also consulted.  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  Hemoglobin stable -Continue trickle feeds with free water flush pending SLP eval -Monitor hemoglobin and transfuse if <7.0  COVID-19 pneumonia extubated 12/14.  Intermittently on nasal cannula and room air.  Completed dexamethasone and remdesivir and recent treatment of Solu-Medrol -Aspiration and contact/airborne precautions -Follow-up with SLP  Acute encephalopathy of unknown etiology, likely exacerbated by COVID-19 with catatonia with history of mental retardation no evidence of CNS vasculitis at this time.  Has been evaluated by neurology: Supportive care and allowed to recover from Covid encephalopathy. significantly improved today and likely near baseline -appreciate SLP reevaluation of tube feeds as patient is likely medically stable for discharge to  SNF  Severe multilevel cervical disc degeneration and stenosis noted on MRI cervical spine today.  Neurology consulted neurosurgery.  May have some degree of cervical myelopathy but underlying problem is significant  encephalopathy probably from chronic illness and perfusion status -Per neurosurgery: No surgical intervention at this time however if mental status improves/recovers she could be seen outpatient for possible spinal decompression  Hypernatremia Resolved -Continue free water flush to tube feeds  AKI resolved  Hypokalemia resolved  Wheeze -Combivent  Elevated LFTs likely from shock liver nearly resolved.  Continue to monitor  Relative adrenal dysfunction completed stress dose steroids  Hypothyroidism -Continue Synthroid  Left upper lobe 6 mm pulmonary nodule -CT in 6 to 12 months   DVT prophylaxis: SCDs Diet: Tube feeds Family Communication: Called patient's sister with no response Disposition Plan: She is likely medically stable for discharge.  Barrier to discharge is patient's p.o. 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  Unable to provide significant history due to mental status  Objective   Vitals:   04/04/19 0034 04/04/19 0620 04/04/19 0800 04/04/19 1157  BP: (!) 169/82 (!) 158/99 (!) 156/84 (!) 151/84  Pulse: 81 80    Resp: (!) 22 (!) 25  20  Temp: 99.1 F (37.3 C) 99.6 F (37.6 C) 97.9 F (36.6 C) 98.7 F (37.1 C)  TempSrc: Oral Axillary Oral Oral  SpO2: 96% 100% 100% 98%  Weight:  93.2 kg    Height:        Intake/Output Summary (Last 24 hours) at 04/04/2019 1549 Last data filed at 04/04/2019 1030 Gross per 24 hour  Intake 1425.5 ml  Output 1500 ml  Net -74.5 ml   Filed Weights   04/02/19 0412 04/03/19 0625 04/04/19 0620  Weight:  98.4 kg 101.4 kg 93.2 kg    Examination:  Physical Exam Vitals and nursing note reviewed.  Constitutional:      Appearance: She is not ill-appearing.  HENT:     Nose:     Comments: NG tube    Mouth/Throat:     Mouth: Mucous membranes are dry.  Cardiovascular:     Comments: Difficult to auscultate with bedside disposable stethoscope Pulmonary:     Effort: No respiratory distress.     Breath sounds: No wheezing.  Musculoskeletal:        General: No swelling or tenderness.     Cervical back: Normal range of motion.  Neurological:     Mental Status: She is alert.     Comments: Appears at/near baseline mental retardation  Psychiatric:        Mood and Affect: Mood normal.     Data Reviewed: I have personally reviewed following labs and imaging studies  CBC: Recent Labs  Lab 03/31/19 0417 04/01/19 0327 04/02/19 0817 04/03/19 0534 04/04/19 0357  WBC 16.0* 14.0* 12.6* 13.1* 13.0*  NEUTROABS  --  11.3* 10.1* 10.4*  --   HGB 7.3* 7.8* 8.4* 8.9* 9.8*  HCT 21.7* 24.1* 26.0* 28.6* 31.1*  MCV 91.2 93.4 91.9 94.1 93.7  PLT 92* 94* 115* 151 [id number]   Basic Metabolic  Panel: Recent Labs  Lab 03/30/19 0357 03/31/19 0417 04/01/19 0327 04/02/19 0817 04/03/19 0534 04/04/19 0357  NA 144 149* 145 147* 148* 145  K 3.4* 3.0* 2.9* 4.0 4.1 3.9  CL 110 113* 111 114* 113* 107  CO2 23 25 25 25 27 28   GLUCOSE 117* 125* 133* 135* 133* 135*  BUN 67* 55* 42* 33* 34* 33*  CREATININE 1.35* 1.24* 1.16* 0.96 0.98 0.98  CALCIUM 8.3* 8.4* 8.3* 8.3* 8.0* 8.4*  MG 2.4  --   --   --   --   --    GFR: Estimated Creatinine Clearance: 67.3 mL/min (by C-G formula based on SCr of 0.98 mg/dL). Liver Function Tests: Recent Labs  Lab 03/31/19 0417 04/01/19 0327 04/02/19 0817 04/03/19 0534 04/04/19 0357  AST 76* 51* 36 32 32  ALT 469* 364* 256* 207* 168*  ALKPHOS 62 59 79 76 84  BILITOT 1.9* 1.6* 2.0* 1.7* 1.1  PROT 5.1* 4.9* 5.1* 5.1* 5.5*  ALBUMIN 2.6* 2.6* 2.4* 2.3* 2.4*   No results for  input(s): LIPASE, AMYLASE in the last 168 hours. Recent Labs  Lab 04/01/19 1752  AMMONIA 13   Coagulation Profile: No results for input(s): INR, PROTIME in the last 168 hours. Cardiac Enzymes: No results for input(s): CKTOTAL, CKMB, CKMBINDEX, TROPONINI in the last 168 hours. BNP (last 3 results) No results for input(s): PROBNP in the last 8760 hours. HbA1C: No results for input(s): HGBA1C in the last 72 hours. CBG: Recent Labs  Lab 04/03/19 1940 04/04/19 0027 04/04/19 0410 04/04/19 0745 04/04/19 1201  GLUCAP 112* 123* 132* 134* 107*   Lipid Profile: No results for input(s): CHOL, HDL, LDLCALC, TRIG, CHOLHDL, LDLDIRECT in the last 72 hours. Thyroid Function Tests: No results for input(s): TSH, T4TOTAL, FREET4, T3FREE, THYROIDAB in the last 72 hours. Anemia Panel: Recent Labs    04/01/19 1752  VITAMINB12 1,297*   Sepsis Labs: No results for input(s): PROCALCITON, LATICACIDVEN in the last 168 hours.  No results found for this or any previous visit (from the past 240 hour(s)).       Radiology Studies: MR CERVICAL SPINE WO CONTRAST  Result Date: 04/03/2019 CLINICAL DATA:  Focal neuro deficit.  COVID-19 positive EXAM: MRI CERVICAL SPINE WITHOUT CONTRAST TECHNIQUE: Multiplanar, multisequence MR imaging of the cervical spine was performed. No intravenous contrast was administered. COMPARISON:  CT angio head and neck 03/31/2019 FINDINGS: Alignment: Image quality degraded by moderate motion. Normal alignment. Vertebrae: Negative for fracture or mass Cord: Limited cord evaluation due to motion. Posterior Fossa, vertebral arteries, paraspinal tissues: Negative Disc levels: C2-3: Negative C3-4: Disc degeneration and spurring causing mild spinal stenosis and mild to moderate foraminal stenosis bilaterally. C4-5: Disc degeneration and spondylosis. Cord flattening with moderate spinal stenosis and moderate foraminal stenosis bilaterally C5-6: Disc degeneration and spondylosis. Mild to  moderate spinal stenosis. Mild to moderate foraminal stenosis bilaterally. C6-7: Disc degeneration and spondylosis. Mild spinal and foraminal stenosis bilaterally C7-T1: Mild disc and facet degeneration. Mild foraminal narrowing bilaterally. IMPRESSION: Image quality degraded by extensive motion Multilevel spinal stenosis and foraminal stenosis as above. No acute finding. Electronically Signed   By: Franchot Gallo M.D.   On: 04/03/2019 11:12        Scheduled Meds: . chlorhexidine  15 mL Mouth Rinse BID  . Chlorhexidine Gluconate Cloth  6 each Topical Daily  . docusate  100 mg Per Tube Daily  . feeding supplement (PRO-STAT SUGAR FREE 64)  30 mL Per Tube BID  . free water  100 mL Per Tube Q8H  . guaiFENesin  5 mL Per Tube BID  . insulin aspart  0-15 Units Subcutaneous Q4H  . Ipratropium-Albuterol  1 puff Inhalation Q6H  . levothyroxine  100 mcg Per Tube Q0600  . mouth rinse  15 mL Mouth Rinse q12n4p  . sodium chloride flush  10-40 mL Intracatheter Q12H  . thiamine  100 mg Oral Daily  . vitamin C  500 mg Per Tube Daily  . zinc sulfate  220 mg Per Tube Daily   Continuous Infusions: . sodium chloride Stopped (03/12/19 1210)  . feeding supplement (OSMOLITE 1.5 CAL) 1,000 mL (04/04/19 0317)     LOS: 24 days    Time spent: 25 minutes with over 50% of the time coordinating the patient's care    Harold Hedge, DO [facility] Pager [phone]  If 7PM-7AM, please contact night-coverage [email or web address] Password [facility] 04/04/2019, 3:49 PM

## 2019-04-04 NOTE — Plan of Care (Signed)
  Problem: Education: Goal: Knowledge of risk factors and measures for prevention of condition will improve Outcome: Not Progressing Note: Confusion   Problem: Coping: Goal: Psychosocial and spiritual needs will be supported Outcome: Progressing   Problem: Respiratory: Goal: Will maintain a patent airway Outcome: Progressing Goal: Complications related to the disease process, condition or treatment will be avoided or minimized Outcome: Progressing   Problem: Education: Goal: Knowledge of General Education information will improve Description: Including pain rating scale, medication(s)/side effects and non-pharmacologic comfort measures Outcome: Not Progressing Note: Confusion   Problem: Health Behavior/Discharge Planning: Goal: Ability to manage health-related needs will improve Outcome: Not Progressing Note: Confusion   Problem: Clinical Measurements: Goal: Ability to maintain clinical measurements within normal limits will improve Outcome: Progressing Goal: Will remain free from infection Outcome: Progressing Goal: Diagnostic test results will improve Outcome: Progressing Goal: Respiratory complications will improve Outcome: Progressing Goal: Cardiovascular complication will be avoided Outcome: Progressing   Problem: Activity: Goal: Risk for activity intolerance will decrease Outcome: Not Progressing Note: Immobility   Problem: Nutrition: Goal: Adequate nutrition will be maintained Outcome: Not Progressing Note: NPO/tube feeding/dysphagia   Problem: Coping: Goal: Level of anxiety will decrease Outcome: Not Progressing Note: Confusion   Problem: Elimination: Goal: Will not experience complications related to bowel motility Outcome: Progressing Goal: Will not experience complications related to urinary retention Outcome: Progressing   Problem: Pain Managment: Goal: General experience of comfort will improve Outcome: Progressing   Problem: Safety: Goal:  Ability to remain free from injury will improve Outcome: Progressing   Problem: Skin Integrity: Goal: Risk for impaired skin integrity will decrease Outcome: Not Progressing Note: Immobility/incontinence

## 2019-04-04 NOTE — Progress Notes (Signed)
  Speech Language Pathology Treatment: Dysphagia  Patient Details Name: Rebecca Bruce MRN: [id number] DOB: [birth year] Today's Date: 04/04/2019 Time: [id number] SLP Time Calculation (min) (ACUTE ONLY): 19 min  Assessment / Plan / Recommendation Clinical Impression  With coaxing, encouragement and placing the straw, spoon to lips despite intermittent verbal objection she opened mouth and accepted 2 bites puree (pudding) and thin liquids. She swatted therapists hand away once. Poor oral control as she pools saliva at rest and mild-mod pudding residue falling from left oral cavity (leaning to left side). Oral transit delays were mild but manipulation was decreased. No cough noted post swallows however her voice is significantly wet at rest indicating likely penetration of saliva at least. Achieving adequate amounts will be challenging with intellectual disability affecting behavior/willingness. Discussed situation and therapist's recommendation with Dr. Neysa Bruce including initiating Dys 1 (puree) texture, nectar thick liquids, straws allowed, full assist/encouragement and decreasing volume/rate of tube feedings if able to promote hunger. Will follow for safety and efficiency of po recommendations.       HPI HPI: 58 y.o. female with PMHx of hypothyroidism, HTN, intellectual disability-lives in a group home for the past 20 years (normally fairly independent and talks)-brought in to [facility] on 11/28 for altered mental status, poor oral intake-further evaluation revealed COVID-19 pneumonia.  Encephalopathic, leading to clinical swallow evaluation 12/01 - noted that pt visually tracked people in room; did not initiate movement or recognize/manipulate POs.  Pt was intubated 12/4-14.  Dx include severe catatonic reaction of uncertain etiology, ARF with hypoxemia.      SLP Plan  Other (Comment)(RN to try thin and puree)       Recommendations  Diet recommendations: Other(comment);Thin liquid;Dysphagia 1  (puree)(RN to try thin, puree) Liquids provided via: Straw Medication Administration: Via alternative means Supervision: Full supervision/cueing for compensatory strategies Compensations: Slow rate;Small sips/bites Postural Changes and/or Swallow Maneuvers: Seated upright 90 degrees                Oral Care Recommendations: Oral care BID Follow up Recommendations: Other (comment)(group home ) Plan: Other (Comment)(RN to try thin and puree)                      Houston Siren 04/04/2019, 3:57 PM  Orbie Pyo Colvin Caroli.Ed Risk analyst [phone] Office [phone]

## 2019-04-04 NOTE — Progress Notes (Signed)
   Vital Signs MEWS/VS Documentation      04/03/2019 1900 04/03/2019 1947 04/03/2019 2200 04/04/2019 0034   MEWS Score:  1  1  2  1    MEWS Score Color:  Green  Green  Yellow  Green   Resp:  --  --  (!) 26  (!) 22   Pulse:  --  --  89  81   BP:  --  --  (!) 179/94  (!) 169/82   Temp:  --  --  99.7 F (37.6 C)  99.1 F (37.3 C)   O2 Device:  --  --  Room Air  Room Air   Level of Consciousness:  --  Alert  --  --      No acute changes.       Rebecca Bruce 04/04/2019,2:39 AM

## 2019-04-05 LAB — CBC
HCT: 32.1 % — ABNORMAL LOW (ref 36.0–46.0)
Hemoglobin: 10.2 g/dL — ABNORMAL LOW (ref 12.0–15.0)
MCH: 29.3 pg (ref 26.0–34.0)
MCHC: 31.8 g/dL (ref 30.0–36.0)
MCV: 92.2 fL (ref 80.0–100.0)
Platelets: 226 10*3/uL (ref 150–400)
RBC: 3.48 MIL/uL — ABNORMAL LOW (ref 3.87–5.11)
RDW: 17.3 % — ABNORMAL HIGH (ref 11.5–15.5)
WBC: 12.5 10*3/uL — ABNORMAL HIGH (ref 4.0–10.5)
nRBC: 0 % (ref 0.0–0.2)

## 2019-04-05 LAB — BASIC METABOLIC PANEL
Anion gap: 6 (ref 5–15)
BUN: 31 mg/dL — ABNORMAL HIGH (ref 6–20)
CO2: 28 mmol/L (ref 22–32)
Calcium: 8.3 mg/dL — ABNORMAL LOW (ref 8.9–10.3)
Chloride: 107 mmol/L (ref 98–111)
Creatinine, Ser: 1.02 mg/dL — ABNORMAL HIGH (ref 0.44–1.00)
GFR calc Af Amer: >60 mL/min (ref >60)
GFR calc non Af Amer: >60 mL/min (ref >60)
Glucose, Bld: 150 mg/dL — ABNORMAL HIGH (ref 70–99)
Potassium: 4 mmol/L (ref 3.5–5.1)
Sodium: 141 mmol/L (ref 135–145)

## 2019-04-05 LAB — GLUCOSE, CAPILLARY
Glucose-Capillary: 132 mg/dL — ABNORMAL HIGH (ref 70–99)
Glucose-Capillary: 151 mg/dL — ABNORMAL HIGH (ref 70–99)
Glucose-Capillary: 74 mg/dL (ref 70–99)
Glucose-Capillary: 112 mg/dL — ABNORMAL HIGH (ref 70–99)
Glucose-Capillary: 155 mg/dL — ABNORMAL HIGH (ref 70–99)

## 2019-04-05 MED ORDER — HYDRALAZINE HCL 20 MG/ML IJ SOLN
5.0000 mg | Freq: Four times a day (QID) | INTRAMUSCULAR | Status: DC | PRN
  Administered 2019-04-05: 5 mg via INTRAVENOUS
  Filled 2019-04-05: qty 1

## 2019-04-05 NOTE — Progress Notes (Signed)
PROGRESS NOTE    Rebecca Bruce    Code Status: Full Code  [id number] DOB: [birth year] DOA: 03/11/2019  PCP: Steele Sizer, MD    Hospital Summary  58 y/o female with history of hypothyroid, htn, intellectual debility [grp home resident for 20 yr] brought to [facility] on 11/28 in the setting of altered mental status from her group home.  She was found to have COVID pneumonia, treated with decadron and remdesivir then sent to [facility] on 11/30 - progressive increase in acute encephalopathy and has become less and less responsive.   12/4 PCCM asked to see her because she reached the point that she was aspirating and not responding to pharyngeal/tracheal suctioning.   exam findings have waxed and waned over the last three days but have not generally moved in the right direction.   Neurology saw her on 12/2 and apparently at that time she followed some simple commands, but she was non-verbal at the time with significant rigidity noted in her upper extremities.   At baseline lives in a group home, can bathe herself and have a conversation.  Has baseline anxiety and depression.  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   She is transferred from [place] [facility] 12/18 due to finding of hemoperitoneum for emergent angiogram  Significant events: 11/28 Admission [facility] 11/30 Transfer to [facility] 12/04 Intubation for inability to protect airway 12/05 Diprivan, fentanyl gtt. Suctioned large plug by RT >> Peak pressure improved.  Tol SBT 12/12 Tx to [place]. Sedation decreased but put back on fentanyl drip overnight for tachycardia.  Failed SBT 12/13 PSV wean, mental status barrier to extubation 12/14: Extubated.  During the evening hours patient hypotensive.  This was after receiving propranolol placed on Neo-Synephrine and administered albumin 12/15: phenylephrine doses increasing.  12/16: pressor demands down after volume, stress steroids, and  art line placement.  12/17: Hemoglobin acutely dropped from 11.6 down to 3.9, received 2 units of blood, CT abdomen pelvis showing large new hemoperitoneum involving the left upper quadrant of the abdomen near the splenic flexure of the colon etiology was not clear.  All anticoagulation was discontinued. 12/18: Received another unit of blood overnight, hemoglobin had gone from 7.4 down to 6.9, with no bump following transfusion.  General surgery consulted.  Interventional radiology also consulted.  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  Hemoglobin stable -Monitor hemoglobin and transfuse if <7.0  COVID-19 pneumonia  extubated 12/14.  Intermittently on nasal cannula and room air.   Completed dexamethasone and remdesivir  -contact/airborne precautions  Acute encephalopathy of unknown etiology, likely exacerbated by COVID-19 with catatonia with history of mental retardation  no evidence of CNS vasculitis at this time per neuro.  likely near baseline -Further diet recommendations per SLP  Severe multilevel cervical disc degeneration and stenosis  noted on MRI cervical spine   Neurology consulted neurosurgery.   May have some degree of cervical myelopathy but underlying problem is significant encephalopathy probably from chronic illness and perfusion status -Per  neurosurgery: No surgical intervention at this time however if mental status improves/recovers she could be seen outpatient for possible spinal decompression  Hypernatremia Resolved with free water flush to tube feeds  AKI  resolved  Hypokalemia resolved  Wheeze -Combivent  Elevated LFTs likely from shock liver nearly resolved.  Continue to monitor  Relative adrenal dysfunction completed stress dose steroids  Hypothyroidism -Continue Synthroid  Left upper lobe 6 mm pulmonary nodule -CT in 6 to 12 months   DVT prophylaxis: SCDs Diet: Tube feeds Family Communication: Called patient's sister with no response Disposition Plan: She is likely medically stable for discharge.  Barrier to discharge is patient's p.o. 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  Answering yes/no questions.  Comfortable.  Denies complaints  Objective   Vitals:   04/04/19 2333 04/05/19 0357 04/05/19 0939 04/05/19 1200  BP: (!) 183/88 (!) 157/8 (!) 173/80 (!) 162/75  Pulse: 84 100    Resp: 18 18 (!) 21 20  Temp: 98.8 F (37.1 C) 98.5 F (36.9 C)  98.7 F (37.1 C)  TempSrc: Oral Oral  Oral  SpO2: 97% 96%    Weight:      Height:        Intake/Output Summary (Last 24 hours) at 04/05/2019 1435 Last data filed at 04/05/2019 0830 Gross per 24 hour  Intake 580 ml  Output 2700 ml  Net -2120 ml   Filed Weights   04/02/19 0412 04/03/19 0625 04/04/19 0620  Weight: 98.4 kg 101.4 kg 93.2 kg    Examination:  Physical Exam Vitals and nursing note reviewed.  Constitutional:      Appearance: She is not ill-appearing.  HENT:     Nose:     Comments: NG tube    Mouth/Throat:     Mouth: Mucous membranes are dry.  Cardiovascular:     Comments: Difficult to auscultate with bedside disposable stethoscope Pulmonary:     Effort: No respiratory distress.     Breath sounds: No  wheezing.  Musculoskeletal:        General: No swelling or tenderness.     Cervical back: Normal range of motion.  Neurological:     Mental Status: She is alert.     Comments: Appears at/near baseline mental retardation  Psychiatric:        Mood and Affect: Mood normal.     Data Reviewed: I have personally reviewed following labs and imaging studies  CBC: Recent Labs  Lab 04/01/19 0327 04/02/19 0817 04/03/19 0534 04/04/19 0357 04/05/19 0310  WBC 14.0* 12.6* 13.1* 13.0* 12.5*  NEUTROABS 11.3* 10.1* 10.4*  --   --   HGB 7.8* 8.4* 8.9* 9.8* 10.2*  HCT 24.1* 26.0* 28.6* 31.1* 32.1*  MCV 93.4 91.9 94.1 93.7 92.2  PLT 94* 115* 151 197 [id number]   Basic Metabolic Panel: Recent Labs  Lab 03/30/19 0357 04/01/19 0327 04/02/19 0817 04/03/19 0534 04/04/19 0357 04/05/19 0310  NA 144 145 147* 148* 145 141  K 3.4* 2.9* 4.0 4.1 3.9 4.0  CL 110 111 114* 113* 107 107  CO2 23 25 25 27 28 28   GLUCOSE 117* 133* 135* 133* 135* 150*  BUN 67*  42* 33* 34* 33* 31*  CREATININE 1.35* 1.16* 0.96 0.98 0.98 1.02*  CALCIUM 8.3* 8.3* 8.3* 8.0* 8.4* 8.3*  MG 2.4  --   --   --   --   --    GFR: Estimated Creatinine Clearance: 64.7 mL/min (A) (by C-G formula based on SCr of 1.02 mg/dL (H)). Liver Function Tests: Recent Labs  Lab 03/31/19 0417 04/01/19 0327 04/02/19 0817 04/03/19 0534 04/04/19 0357  AST 76* 51* 36 32 32  ALT 469* 364* 256* 207* 168*  ALKPHOS 62 59 79 76 84  BILITOT 1.9* 1.6* 2.0* 1.7* 1.1  PROT 5.1* 4.9* 5.1* 5.1* 5.5*  ALBUMIN 2.6* 2.6* 2.4* 2.3* 2.4*   No results for input(s): LIPASE, AMYLASE in the last 168 hours. Recent Labs  Lab 04/01/19 1752  AMMONIA 13   Coagulation Profile: No results for input(s): INR, PROTIME in the last 168 hours. Cardiac Enzymes: No results for input(s): CKTOTAL, CKMB, CKMBINDEX, TROPONINI in the last 168 hours. BNP (last 3 results) No results for input(s): PROBNP in the last 8760 hours. HbA1C: No results for input(s): HGBA1C in the  last 72 hours. CBG: Recent Labs  Lab 04/04/19 2010 04/04/19 2331 04/05/19 0351 04/05/19 0828 04/05/19 1145  GLUCAP 113* 124* 132* 151* 74   Lipid Profile: No results for input(s): CHOL, HDL, LDLCALC, TRIG, CHOLHDL, LDLDIRECT in the last 72 hours. Thyroid Function Tests: No results for input(s): TSH, T4TOTAL, FREET4, T3FREE, THYROIDAB in the last 72 hours. Anemia Panel: No results for input(s): VITAMINB12, FOLATE, FERRITIN, TIBC, IRON, RETICCTPCT in the last 72 hours. Sepsis Labs: No results for input(s): PROCALCITON, LATICACIDVEN in the last 168 hours.  No results found for this or any previous visit (from the past 240 hour(s)).       Radiology Studies: No results found.      Scheduled Meds: . chlorhexidine  15 mL Mouth Rinse BID  . Chlorhexidine Gluconate Cloth  6 each Topical Daily  . docusate  100 mg Per Tube Daily  . feeding supplement (PRO-STAT SUGAR FREE 64)  30 mL Per Tube BID  . free water  100 mL Per Tube Q8H  . guaiFENesin  5 mL Per Tube BID  . insulin aspart  0-15 Units Subcutaneous Q4H  . Ipratropium-Albuterol  1 puff Inhalation Q6H  . levothyroxine  100 mcg Per Tube Q0600  . mouth rinse  15 mL Mouth Rinse q12n4p  . sodium chloride flush  10-40 mL Intracatheter Q12H  . thiamine  100 mg Oral Daily  . vitamin C  500 mg Per Tube Daily  . zinc sulfate  220 mg Per Tube Daily   Continuous Infusions: . sodium chloride Stopped (03/12/19 1210)  . feeding supplement (OSMOLITE 1.5 CAL) 1,000 mL (04/04/19 0317)     LOS: 25 days    Time spent: 20 minutes with over 50% of the time coordinating the patient's care    Harold Hedge, DO [facility] Pager [phone]  If 7PM-7AM, please contact night-coverage [email or web address] Password [facility] 04/05/2019, 2:35 PM

## 2019-04-06 LAB — GLUCOSE, CAPILLARY
Glucose-Capillary: 128 mg/dL — ABNORMAL HIGH (ref 70–99)
Glucose-Capillary: 137 mg/dL — ABNORMAL HIGH (ref 70–99)
Glucose-Capillary: 137 mg/dL — ABNORMAL HIGH (ref 70–99)
Glucose-Capillary: 140 mg/dL — ABNORMAL HIGH (ref 70–99)
Glucose-Capillary: 151 mg/dL — ABNORMAL HIGH (ref 70–99)
Glucose-Capillary: 90 mg/dL (ref 70–99)
Glucose-Capillary: 96 mg/dL (ref 70–99)

## 2019-04-06 MED ORDER — PRO-STAT SUGAR FREE PO LIQD
30.0000 mL | Freq: Three times a day (TID) | ORAL | Status: DC
  Administered 2019-04-06 – 2019-04-09 (×9): 30 mL
  Filled 2019-04-06 (×9): qty 30

## 2019-04-06 MED ORDER — OSMOLITE 1.5 CAL PO LIQD
1000.0000 mL | ORAL | Status: DC
  Administered 2019-04-06 – 2019-04-08 (×2): 1000 mL
  Filled 2019-04-06 (×8): qty 1000

## 2019-04-06 NOTE — Progress Notes (Signed)
Nutrition Follow-up  DOCUMENTATION CODES:   Obesity unspecified  INTERVENTION:  Recommend continuing with Cortrak with supplemental nutrition until pt meeting at least 60% of needs by mouth.   Encourage po intake  Change to nocturnal to Tube Feeding: Osmolite 1.5 at 100 ml/hr x 10 hrs (8pm-6am) Pro-Stat 30 mL TID Provides 108 g of protein, 1800 kcals and 760 mL of free water  Meets 100% estimated calorie and protein needs  NUTRITION DIAGNOSIS:   Increased nutrient needs related to acute illness(COVID PNA) as evidenced by estimated needs.  Ongoing  GOAL:   Patient will meet greater than or equal to 90% of their needs  Progressing  MONITOR:   Vent status, Labs, Weight trends, TF tolerance, I & O's  REASON FOR ASSESSMENT:   Consult Assessment of nutrition requirement/status, Enteral/tube feeding initiation and management  ASSESSMENT:  RD working remotely.  Pt with PMH of hypothyroidism, HTN, intellectual disability who lives in a group home. Pt admitted with AMS, poor oral intake, and COVID-19 PNA.  11/28 Admit  12/04 Intubated 12/14 Extubated 12/16 Cortrak placed-Gastric 12/17 CT abdomen with large new hemoperitoneum 12/18 Transferred to [facility] from [facility]  12/24 diet advanced to DYS 1; Nectar Thick. Patient documented with 0% intake of 1 recorded meal s/p diet order. Per SLP notes, achieving adequate amounts will be challenging with intellectual disability affecting behavior/willingness. RD will change to nocturnal feedings to encourage po intake of meals.   I/Os +8226 ml since admit     -390 ml x 24 hrs UOP: 1800 ml x 24 hrs  Admit wt 85 kg Current wt 93.2 kg - non-pitting BLE edema per 12/25 RN flowsheet  Medications reviewed and include: Colace, SS novolog, Thiamine, Vit C, Zinc sulfate  Free water 100 ml every 8 hrs  Labs: CBGs 128-155 x 24 hrs, BUN 31 (H), Cr 1.02 (H), WBC 12.5 (H)  Diet Order:   Diet Order            DIET - DYS 1 Room service  appropriate? Yes; Fluid consistency: Nectar Thick  Diet effective now              EDUCATION NEEDS:   No education needs have been identified at this time  Skin:  Skin Assessment: Reviewed RN Assessment Skin Integrity Issues:: Stage II Stage II: L buttock Other: MASD- perineum, groin  Last BM:  12/26 (type 7)  Height:   Ht Readings from Last 1 Encounters:  03/12/19 5\' 2"  (1.575 m)    Weight:   Wt Readings from Last 1 Encounters:  04/04/19 93.2 kg    Ideal Body Weight:  50 kg  BMI:  Body mass index is 37.58 kg/m.  Estimated Nutritional Needs:   Kcal:  1725-2150  Protein:  100-125 gm  Fluid:  >/= 1.8 L   Lajuan Lines, RD, LDN Clinical Nutrition [place] Telephone [phone] After Hours/Weekend Pager: [phone]

## 2019-04-06 NOTE — Progress Notes (Signed)
Pt arrived to room 6N09 via bed from 5W. Received report from [place], [place]. See assessment. Will continue to monitor.

## 2019-04-06 NOTE — Progress Notes (Signed)
Patient transferred to 6N report given to RN.

## 2019-04-06 NOTE — Progress Notes (Signed)
PROGRESS NOTE    Rebecca Bruce    Code Status: Full Code  [id number] DOB: April 01, 1961 DOA: 03/11/2019  PCP: Steele Sizer, MD    Hospital Summary  58 y/o female with history of hypothyroid, htn, intellectual debility [grp home resident for 20 yr] brought to [facility] on 11/28 in the setting of altered mental status from her group home.  She was found to have COVID pneumonia, treated with decadron and remdesivir then sent to [facility] on 11/30 - progressive increase in acute encephalopathy and has become less and less responsive.   12/4 PCCM asked to see her because she reached the point that she was aspirating and not responding to pharyngeal/tracheal suctioning.   exam findings have waxed and waned over the last three days but have not generally moved in the right direction.   Neurology saw her on 12/2 and apparently at that time she followed some simple commands, but she was non-verbal at the time with significant rigidity noted in her upper extremities.   At baseline lives in a group home, can bathe herself and have a conversation.  Has baseline anxiety and depression.  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   She is transferred from [place] long 12/18 due to finding of hemoperitoneum for emergent angiogram  Significant events: 11/28 Admission [facility] 11/30 Transfer to [facility] 12/04 Intubation for inability to protect airway 12/05 Diprivan, fentanyl gtt. Suctioned large plug by RT >> Peak pressure improved.  Tol SBT 12/12 Tx to [place]. Sedation decreased but put back on fentanyl drip overnight for tachycardia.  Failed SBT 12/13 PSV wean, mental status barrier to extubation 12/14: Extubated.  During the evening hours patient hypotensive.  This was after receiving propranolol placed on Neo-Synephrine and administered albumin 12/15: phenylephrine doses increasing.  12/16: pressor demands down after volume, stress steroids, and  art line placement.  12/17: Hemoglobin acutely dropped from 11.6 down to 3.9, received 2 units of blood, CT abdomen pelvis showing large new hemoperitoneum involving the left upper quadrant of the abdomen near the splenic flexure of the colon etiology was not clear.  All anticoagulation was discontinued. 12/18: Received another unit of blood overnight, hemoglobin had gone from 7.4 down to 6.9, with no bump following transfusion.  General surgery consulted.  Interventional radiology also consulted.  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  Hemoglobin stable -Monitor hemoglobin and transfuse if <7.0  COVID-19 pneumonia  extubated 12/14.  Intermittently on nasal cannula and room air.   Completed dexamethasone and remdesivir  -Transfer to [place] floor 12/26 as patient is out of the 21-day infectious window  Acute encephalopathy of unknown etiology, likely exacerbated by COVID-19 with catatonia with history of mental retardation  no evidence of CNS vasculitis at this time per neuro.  Appears at/near baseline -Further diet recommendations per SLP -Can likely be discharged to group home once Cortrak is removed  Severe multilevel cervical disc degeneration and stenosis  noted on MRI cervical spine   Neurology consulted neurosurgery.  May have some degree of cervical myelopathy but underlying problem is significant encephalopathy probably from chronic illness and perfusion status -Per neurosurgery: No surgical intervention at this time however if mental status improves/recovers she could be seen  outpatient for possible spinal decompression  Hypernatremia Resolved with free water flush to tube feeds  AKI resolved  Hypokalemia resolved  Wheeze Resolved -Combivent  Elevated LFTs likely from shock liver nearly resolved.  Continue to monitor  Relative adrenal dysfunction completed stress dose steroids  Hypothyroidism -Continue Synthroid  Left upper lobe 6 mm pulmonary nodule -CT in 6 to 12 months   DVT prophylaxis: SCDs Diet: Continue core track with supplemental nutrition until patient meeting only 60% of needs by mouth, encourage p.o. intake per RD.  Of note, per sister patient's favorite food is macaroni and cheese. Family Communication: Discussed with sister Disposition Plan: Barrier to discharge is patient's p.o. 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  Comfortable  Objective   Vitals:   04/05/19 2001 04/06/19 0000 04/06/19 0400 04/06/19 1342  BP: (!) 178/75 (!) 164/73 (!) 173/83 (!) 145/76  Pulse: 97 86 82 95  Resp:    18  Temp: 98.1 F (36.7 C) 97.8 F (36.6 C) 98.3 F (36.8 C) 100.2 F (37.9 C)  TempSrc: Oral Oral Oral Oral  SpO2: 98% 97% 98% 99%  Weight:      Height:        Intake/Output Summary (Last 24 hours) at 04/06/2019 1447 Last data filed at 04/06/2019 1345 Gross per 24 hour  Intake 1410 ml  Output 1700 ml  Net -290 ml   Filed Weights   04/02/19 0412 04/03/19 0625 04/04/19 0620  Weight: 98.4 kg 101.4 kg 93.2 kg    Examination:  Physical Exam Vitals and nursing note reviewed.  Constitutional:      Appearance: She is not ill-appearing.    HENT:     Head: Normocephalic.     Nose:     Comments: NG tube Eyes:     Conjunctiva/sclera: Conjunctivae normal.  Cardiovascular:     Comments: Did not allow me to auscultate heart Pulmonary:     Effort: Pulmonary effort is normal. No respiratory distress.     Breath sounds: No wheezing.  Abdominal:     Comments: Did not allow me to palpate abdomen  Musculoskeletal:        General: No swelling.  Neurological:     Mental Status: She is alert. Mental status is at baseline.     Data Reviewed: I have personally reviewed following labs and imaging studies  CBC: Recent Labs  Lab 04/01/19 0327 04/02/19 0817 04/03/19 0534 04/04/19 0357 04/05/19 0310  WBC 14.0* 12.6* 13.1* 13.0* 12.5*  NEUTROABS 11.3* 10.1* 10.4*  --   --   HGB 7.8* 8.4* 8.9* 9.8* 10.2*  HCT 24.1* 26.0* 28.6* 31.1* 32.1*  MCV 93.4 91.9 94.1 93.7 92.2  PLT 94* 115* 151 197 [id number]   Basic Metabolic Panel: Recent Labs  Lab 04/01/19 0327 04/02/19 0817 04/03/19 0534 04/04/19 0357 04/05/19 0310  NA 145 147* 148* 145 141  K 2.9* 4.0 4.1  3.9 4.0  CL 111 114* 113* 107 107  CO2 25 25 27 28 28   GLUCOSE 133* 135* 133* 135* 150*  BUN 42* 33* 34* 33* 31*  CREATININE 1.16* 0.96 0.98 0.98 1.02*  CALCIUM 8.3* 8.3* 8.0* 8.4* 8.3*   GFR: Estimated Creatinine Clearance: 64.7 mL/min (A) (by C-G formula based on SCr of 1.02 mg/dL (H)). Liver Function Tests: Recent Labs  Lab 03/31/19 0417 04/01/19 0327 04/02/19 0817 04/03/19 0534 04/04/19 0357  AST 76* 51* 36 32 32  ALT 469* 364* 256* 207* 168*  ALKPHOS 62 59 79 76 84  BILITOT 1.9* 1.6* 2.0* 1.7* 1.1  PROT 5.1* 4.9* 5.1* 5.1* 5.5*  ALBUMIN 2.6* 2.6* 2.4* 2.3* 2.4*   No results for input(s): LIPASE, AMYLASE in the last 168 hours. Recent Labs  Lab 04/01/19 1752  AMMONIA 13   Coagulation Profile: No results for input(s): INR, PROTIME in the last 168 hours. Cardiac Enzymes: No results for input(s): CKTOTAL, CKMB, CKMBINDEX, TROPONINI in the last 168  hours. BNP (last 3 results) No results for input(s): PROBNP in the last 8760 hours. HbA1C: No results for input(s): HGBA1C in the last 72 hours. CBG: Recent Labs  Lab 04/05/19 2046 04/06/19 0059 04/06/19 0427 04/06/19 0742 04/06/19 1144  GLUCAP 155* 137* 140* 137* 128*   Lipid Profile: No results for input(s): CHOL, HDL, LDLCALC, TRIG, CHOLHDL, LDLDIRECT in the last 72 hours. Thyroid Function Tests: No results for input(s): TSH, T4TOTAL, FREET4, T3FREE, THYROIDAB in the last 72 hours. Anemia Panel: No results for input(s): VITAMINB12, FOLATE, FERRITIN, TIBC, IRON, RETICCTPCT in the last 72 hours. Sepsis Labs: No results for input(s): PROCALCITON, LATICACIDVEN in the last 168 hours.  No results found for this or any previous visit (from the past 240 hour(s)).       Radiology Studies: No results found.      Scheduled Meds: . chlorhexidine  15 mL Mouth Rinse BID  . Chlorhexidine Gluconate Cloth  6 each Topical Daily  . docusate  100 mg Per Tube Daily  . feeding supplement (PRO-STAT SUGAR FREE 64)  30 mL Per Tube TID  . free water  100 mL Per Tube Q8H  . guaiFENesin  5 mL Per Tube BID  . insulin aspart  0-15 Units Subcutaneous Q4H  . Ipratropium-Albuterol  1 puff Inhalation Q6H  . levothyroxine  100 mcg Per Tube Q0600  . mouth rinse  15 mL Mouth Rinse q12n4p  . sodium chloride flush  10-40 mL Intracatheter Q12H  . thiamine  100 mg Oral Daily  . vitamin C  500 mg Per Tube Daily  . zinc sulfate  220 mg Per Tube Daily   Continuous Infusions: . sodium chloride Stopped (03/12/19 1210)  . feeding supplement (OSMOLITE 1.5 CAL)       LOS: 26 days    Time spent: 22 minutes with over 50% of the time coordinating the patient's care    Harold Hedge, DO [facility] Pager [phone]  If 7PM-7AM, please contact night-coverage [email or web address] Password [facility] 04/06/2019, 2:47 PM

## 2019-04-06 NOTE — Progress Notes (Signed)
Tube feeding was finally initiated at 2214 after securing a feeding pump in [employer]. Patient tolerated the feeding at a rate of 100 ml/hr.  Will monitor.

## 2019-04-06 NOTE — Progress Notes (Signed)
Sister, Marney Doctor, called for pt update. All questions answered to satisfaction. Will continue to monitor.

## 2019-04-06 NOTE — Progress Notes (Signed)
Per [employer], no available feeding pump but request on the list for next available pump.  On call MD made aware.

## 2019-04-06 NOTE — Plan of Care (Signed)
  Problem: Respiratory: Goal: Complications related to the disease process, condition or treatment will be avoided or minimized Outcome: Progressing   Problem: Education: Goal: Knowledge of General Education information will improve Description: Including pain rating scale, medication(s)/side effects and non-pharmacologic comfort measures Outcome: Progressing   Problem: Elimination: Goal: Will not experience complications related to bowel motility Outcome: Progressing Goal: Will not experience complications related to urinary retention Outcome: Progressing   Problem: Pain Managment: Goal: General experience of comfort will improve Outcome: Progressing   Problem: Safety: Goal: Ability to remain free from injury will improve Outcome: Progressing   Problem: Skin Integrity: Goal: Risk for impaired skin integrity will decrease Outcome: Progressing

## 2019-04-07 DIAGNOSIS — R1310 Dysphagia, unspecified: Secondary | ICD-10-CM

## 2019-04-07 LAB — CBC
HCT: 32.1 % — ABNORMAL LOW (ref 36.0–46.0)
Hemoglobin: 10.1 g/dL — ABNORMAL LOW (ref 12.0–15.0)
MCH: 28.7 pg (ref 26.0–34.0)
MCHC: 31.5 g/dL (ref 30.0–36.0)
MCV: 91.2 fL (ref 80.0–100.0)
Platelets: 302 10*3/uL (ref 150–400)
RBC: 3.52 MIL/uL — ABNORMAL LOW (ref 3.87–5.11)
RDW: 17.5 % — ABNORMAL HIGH (ref 11.5–15.5)
WBC: 10.4 10*3/uL (ref 4.0–10.5)
nRBC: 0 % (ref 0.0–0.2)

## 2019-04-07 LAB — GLUCOSE, CAPILLARY
Glucose-Capillary: 137 mg/dL — ABNORMAL HIGH (ref 70–99)
Glucose-Capillary: 159 mg/dL — ABNORMAL HIGH (ref 70–99)
Glucose-Capillary: 91 mg/dL (ref 70–99)
Glucose-Capillary: 97 mg/dL (ref 70–99)
Glucose-Capillary: 85 mg/dL (ref 70–99)

## 2019-04-07 LAB — BASIC METABOLIC PANEL
Anion gap: 10 (ref 5–15)
BUN: 32 mg/dL — ABNORMAL HIGH (ref 6–20)
CO2: 25 mmol/L (ref 22–32)
Calcium: 8.1 mg/dL — ABNORMAL LOW (ref 8.9–10.3)
Chloride: 106 mmol/L (ref 98–111)
Creatinine, Ser: 1.07 mg/dL — ABNORMAL HIGH (ref 0.44–1.00)
GFR calc Af Amer: >60 mL/min (ref >60)
GFR calc non Af Amer: 58 mL/min — ABNORMAL LOW (ref >60)
Glucose, Bld: 155 mg/dL — ABNORMAL HIGH (ref 70–99)
Potassium: 3.6 mmol/L (ref 3.5–5.1)
Sodium: 141 mmol/L (ref 135–145)

## 2019-04-07 MED ORDER — IPRATROPIUM-ALBUTEROL 20-100 MCG/ACT IN AERS
1.0000 | INHALATION_SPRAY | Freq: Four times a day (QID) | RESPIRATORY_TRACT | Status: DC | PRN
  Filled 2019-04-07: qty 4

## 2019-04-07 MED ORDER — THIAMINE HCL 100 MG PO TABS
100.0000 mg | ORAL_TABLET | Freq: Every day | ORAL | Status: DC
  Administered 2019-04-08 – 2019-04-15 (×8): 100 mg
  Filled 2019-04-07 (×8): qty 1

## 2019-04-07 MED ORDER — IPRATROPIUM-ALBUTEROL 20-100 MCG/ACT IN AERS
1.0000 | INHALATION_SPRAY | Freq: Three times a day (TID) | RESPIRATORY_TRACT | Status: DC
  Administered 2019-04-07: 1 via RESPIRATORY_TRACT
  Filled 2019-04-07: qty 4

## 2019-04-07 NOTE — Progress Notes (Signed)
PROGRESS NOTE  Rebecca Bruce [id number] DOB: 08-Jun-1960 DOA: 03/11/2019 PCP: Steele Sizer, MD  A & P  Acute encephalopathy unknown etiology, likely exacerbated COVID-19 with catatonia, history of mental retardation.  No evidence of CNS vasculitis at this time per neurology.   --Reportedly near baseline. --Reportedly can go back to group home once core track removed  Nutrition.  Continue core track supplementation to meeting 60% of needs by mouth.  Once able to maintain hydration, nutrition can return to group home.  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  Off pressors 12/18. --Hemoglobin stable.  COVID-19 pneumonia, extubated 12/14.  Completed dexamethasone and remdesivir. --Transfer to medical floor 12/26, off isolation, past 21-day infectious w window  Severe multilevel cervical disc degeneration stenosis on MRI.  Neurology, neurosurgery evaluated.  Neurosurgical follow intervention recommended.  Consider outpatient follow-up.  DVT prophylaxis: SCDs Code Status: Full Family Communication: none Disposition Plan: pending    Murray Hodgkins, MD  [facility] Direct contact: see [email or web address] (further directions at bottom of note if needed) 7PM-7AM contact night coverage as at bottom of note 04/07/2019, 5:40 PM  LOS: 27 days   Significant Hospital Events   .    Consults:  .    Procedures:  .   Significant Diagnostic Tests:  Marland Kitchen    Micro Data:  .    Antimicrobials:  .   Interval History/Subjective  Cursing, making inappropriate comments.  Objective   Vitals:  Vitals:   04/07/19 0457 04/07/19 1504  BP: 134/77 (!) 106/54  Pulse: 94 80  Resp:  18  Temp: 99 F (37.2 C) 99.1 F (37.3 C)  SpO2: 100% 100%    Exam:  Constitutional.  Appears mildly anxious.  Nontoxic. Respiratory.  Clear to auscultation bilaterally.  No wheezes,  rales or rhonchi.  Normal respiratory effort. Cardiovascular.  Regular rate and rhythm.  No murmur, rub or gallop.  No lower extremity edema.  I have personally reviewed the following:   Today's Data  . CBG stable . BMP unremarkable . Hemoglobin stable 10.1.  Leukocytosis has resolved.  Scheduled Meds: . chlorhexidine  15 mL Mouth Rinse BID  . Chlorhexidine Gluconate Cloth  6 each Topical Daily  . docusate  100 mg Per Tube Daily  . feeding supplement (PRO-STAT SUGAR FREE 64)  30 mL Per Tube TID  . free water  100 mL Per Tube Q8H  . guaiFENesin  5 mL Per Tube BID  . insulin aspart  0-15 Units Subcutaneous Q4H  . levothyroxine  100 mcg Per Tube Q0600  . mouth rinse  15 mL Mouth Rinse q12n4p  . sodium chloride flush  10-40 mL Intracatheter Q12H  . [START ON 04/08/2019] thiamine  100 mg Per Tube Daily  . vitamin C  500 mg Per Tube Daily  . zinc sulfate  220 mg Per Tube Daily   Continuous Infusions: . sodium chloride Stopped (03/12/19 1210)  . feeding supplement (OSMOLITE 1.5 CAL) 1,000 mL (04/06/19 2214)    Active Problems:   Acute metabolic encephalopathy   Encounter for intubation   Catatonia   Acute respiratory failure with hypoxemia ([place])   COVID-19 virus detected   Pressure injury of skin   Acute blood loss anemia   Hemorrhagic shock (HCC)   LOS: 27 days   How to contact the [facility] Attending or Consulting provider [place] or covering provider during after hours [place], for this  patient?  1. Check the care team in [facility] and look for a) attending/consulting TRH provider listed and b) the [facility] team listed 2. Log into [email or web address] and use [place]'s universal password to access. If you do not have the password, please contact the hospital operator. 3. Locate the [facility] provider you are looking for under [facility] and page to a number that you can be directly reached. 4. If you still have difficulty reaching the provider, please page the [facility] (Director on Call) for the  Hospitalists listed on amion for assistance.

## 2019-04-07 NOTE — Progress Notes (Signed)
Pt's sister, Marney Doctor, called for pt update. All questions answered to satisfaction. Will continue to monitor.

## 2019-04-07 NOTE — Progress Notes (Signed)
Routine central line care: Pt no longer receiving IV medications. Discussed central line removal with Steffanie Dunn, Therapist, sports. L midline in place with extensive bruising to L upper arm. Pt denies pain in arm. Site flushes but no blood return noted.

## 2019-04-07 NOTE — Plan of Care (Signed)
  Problem: Respiratory: Goal: Will maintain a patent airway Outcome: Progressing   Problem: Education: Goal: Knowledge of General Education information will improve Description: Including pain rating scale, medication(s)/side effects and non-pharmacologic comfort measures Outcome: Progressing   Problem: Clinical Measurements: Goal: Respiratory complications will improve Outcome: Progressing Goal: Cardiovascular complication will be avoided Outcome: Progressing   Problem: Nutrition: Goal: Adequate nutrition will be maintained Outcome: Progressing   Problem: Elimination: Goal: Will not experience complications related to bowel motility Outcome: Progressing Goal: Will not experience complications related to urinary retention Outcome: Progressing   Problem: Pain Managment: Goal: General experience of comfort will improve Outcome: Progressing   Problem: Safety: Goal: Ability to remain free from injury will improve Outcome: Progressing   Problem: Skin Integrity: Goal: Risk for impaired skin integrity will decrease Outcome: Progressing

## 2019-04-07 NOTE — Progress Notes (Signed)
Per Sarajane Jews, MD okay to remove central line. IV team aware. Will continue to monitor.

## 2019-04-08 DIAGNOSIS — R627 Adult failure to thrive: Secondary | ICD-10-CM

## 2019-04-08 LAB — GLUCOSE, CAPILLARY
Glucose-Capillary: 101 mg/dL — ABNORMAL HIGH (ref 70–99)
Glucose-Capillary: 116 mg/dL — ABNORMAL HIGH (ref 70–99)
Glucose-Capillary: 116 mg/dL — ABNORMAL HIGH (ref 70–99)
Glucose-Capillary: 132 mg/dL — ABNORMAL HIGH (ref 70–99)
Glucose-Capillary: 72 mg/dL (ref 70–99)
Glucose-Capillary: 96 mg/dL (ref 70–99)
Glucose-Capillary: 99 mg/dL (ref 70–99)

## 2019-04-08 NOTE — Progress Notes (Signed)
Physical Therapy Treatment Patient Details Name: Rebecca Bruce MRN: [id number] DOB: [birth year] Today's Date: 04/08/2019    History of Present Illness Pt is a 58 y.o. female admitted from group home to [facility] on 03/09/19 with AMS, poor oral intake; (+) COVID-19. Pt with progressive increase in acute encephalopathy . 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 Elevated CSF protein is compatible with but not diagnostic of a resolving viral encephalitis.  onia. Brain MRI 12/3 without acute abnormality. ETT 12/4-12/14. 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  PMH includes intellectual disability, HTN.    PT Comments    NT assisting pt on arrival. Pt initially pleasant and cooperative, wanting to get OOB. PT and NT assisted with mobility. She required +2 max assist supine to sit. Pt sat EOB x 5 minutes, min assist to maintain balance. Attempted to perform sit to stand but pt declining. While sitting, pt became agitated and began cursing. Resisting attempts from PT to assist with maintaining sitting balance. PT and NT assist with return to supine in bed, +2 total assist. Pt more calm after return to bed. Maximove would be needed for bed to recliner transfers. Question pt ability to sit safely in recliner at this time. PT to continue and progress mobility as pt able/tolerates.    Follow Up Recommendations  SNF     Equipment Recommendations  Wheelchair cushion (measurements PT);Wheelchair (measurements PT)    Recommendations for Other Services       Precautions / Restrictions Precautions Precautions: Fall;Other (comment) Precaution Comments: can be combative/aggressive, MR at baseline    Mobility  Bed  Mobility Overal bed mobility: Needs Assistance       Supine to sit: +2 for physical assistance;HOB elevated;Max assist Sit to supine: +2 for physical assistance;Total assist   General bed mobility comments: assist with BLE out of bed and to elevate trunk. Use of bed pad to scoot to EOB.  Transfers                 General transfer comment: unable to safely attempt. Pt became agitated sitting EOB requiring return to supine. Will need maximove.  Ambulation/Gait                 Stairs             Wheelchair Mobility    Modified Rankin (Stroke Patients Only)       Balance Overall balance assessment: Needs assistance Sitting-balance support: Feet supported;Bilateral upper extremity supported Sitting balance-Leahy Scale: Poor Sitting balance - Comments: reliant on external support to maintain EOB balance x 5 minutes. Postural control: Left lateral lean;Posterior lean                                  Cognition Arousal/Alertness: Awake/alert Behavior During Therapy: Flat affect Overall Cognitive Status: No family/caregiver present to determine baseline cognitive functioning Area of Impairment: Attention;Following commands;Awareness                   Current Attention Level: Focused   Following Commands: Follows one step commands inconsistently   Awareness: Intellectual   General Comments: Pt very alert. Cooperative initially. Became agitated, resistive and cursing as  session progressed.      Exercises      General Comments        Pertinent Vitals/Pain Pain Assessment: Faces Faces Pain Scale: Hurts even more Pain Location: LUE Pain Descriptors / Indicators: Grimacing;Moaning;Tender;Discomfort Pain Intervention(s): Monitored during session;Limited activity within patient's tolerance    Home Living                      Prior Function            PT Goals (current goals can now be found in the care plan  section) Acute Rehab PT Goals Patient Stated Goal: Pt unable to state Progress towards PT goals: Progressing toward goals    Frequency    Min 2X/week      PT Plan Current plan remains appropriate    Co-evaluation              AM-PAC PT "6 Clicks" Mobility   Outcome Measure  Help needed turning from your back to your side while in a flat bed without using bedrails?: A Lot Help needed moving from lying on your back to sitting on the side of a flat bed without using bedrails?: A Lot Help needed moving to and from a bed to a chair (including a wheelchair)?: Total Help needed standing up from a chair using your arms (e.g., wheelchair or bedside chair)?: Total Help needed to walk in hospital room?: Total Help needed climbing 3-5 steps with a railing? : Total 6 Click Score: 8    End of Session   Activity Tolerance: Other (comment)(behavioral limitation) Patient left: in bed;with call bell/phone within reach;with bed alarm set Nurse Communication: Mobility status PT Visit Diagnosis: Other symptoms and signs involving the nervous system [id number])     Time: [id number] PT Time Calculation (min) (ACUTE ONLY): 24 min  Charges:  $Therapeutic Activity: 23-37 mins                     Lorrin Goodell, PT  Office # [phone] Pager [phone]    Lorriane Shire 04/08/2019, 12:34 PM

## 2019-04-08 NOTE — Progress Notes (Addendum)
PROGRESS NOTE  Rebecca Bruce [id number] DOB: October 09, 1960 DOA: 03/11/2019 PCP: Steele Sizer, MD  A & P  Acute encephalopathy unknown etiology, likely exacerbated COVID-19 with catatonia, history of mental retardation.  No evidence of CNS vasculitis at this time per neurology.   --Reportedly near baseline. --Reportedly can go back to group home once core track removed  Nutrition, adult failure to thrive.  Continue core track supplementation to meeting 60% of needs by mouth.  Once able to maintain hydration, nutrition can return to group home. --Initiate calorie count.  Does not appear the patient is has significant oral intake.  Requested assistance with feeding.  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  Off pressors 12/18. --Hemoglobin last check 12/27 was stable.  COVID-19 pneumonia, extubated 12/14.  Completed dexamethasone and remdesivir. 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  Neurology, neurosurgery evaluated.  Neurosurgical follow intervention recommended.  Consider outpatient follow-up.  DVT prophylaxis: SCDs Code Status: Full Family Communication: none Disposition Plan: pending    Murray Hodgkins, MD  [facility] Direct contact: see [email or web address] (further directions at bottom of note if needed) 7PM-7AM contact night coverage as at bottom of note 04/08/2019, 5:23 PM  LOS: 28 days   Significant Hospital Events   .    Consults:  .    Procedures:  .   Significant Diagnostic Tests:  Marland Kitchen    Micro Data:  .    Antimicrobials:  .   Interval History/Subjective  Patient does not offer coherent history.  Objective   Vitals:  Vitals:   04/08/19 0500 04/08/19 1516  BP:  120/89  Pulse: 99 89  Resp:  18  Temp:  98.3 F (36.8 C)    SpO2:  100%    Exam:  Constitutional.  Appears calm, comfortable. Respiratory.  Clear to auscultation bilaterally.  No wheezes, rales or rhonchi.  Normal respiratory effort. Cardiovascular.  Regular rate and rhythm.  No murmur, rub or gallop.  No lower extremity edema. Psychiatric.  Spontaneous speech and appropriate.  I have personally reviewed the following:   Today's Data  . CBG remains stable.  Scheduled Meds: . chlorhexidine  15 mL Mouth Rinse BID  . Chlorhexidine Gluconate Cloth  6 each Topical Daily  . docusate  100 mg Per Tube Daily  . feeding supplement (PRO-STAT SUGAR FREE 64)  30 mL Per Tube TID  . free water  100 mL Per Tube Q8H  . guaiFENesin  5 mL Per Tube BID  . insulin aspart  0-15 Units Subcutaneous Q4H  . levothyroxine  100 mcg Per Tube Q0600  . mouth rinse  15 mL Mouth Rinse q12n4p  . sodium chloride flush  10-40 mL Intracatheter Q12H  . thiamine  100 mg Per Tube Daily  . vitamin C  500 mg Per Tube Daily  . zinc sulfate  220 mg Per Tube Daily   Continuous Infusions: . sodium chloride Stopped (03/12/19 1210)  . feeding supplement (OSMOLITE 1.5 CAL) 100 mL/hr at 04/08/19 [id number]    Active Problems:   Acute metabolic encephalopathy   Encounter for intubation   Catatonia   Acute respiratory failure with hypoxemia ([place])   COVID-19 virus detected   Pressure injury of skin   Acute blood loss anemia   Hemorrhagic shock (HCC)   LOS: 28 days   How to contact the [facility]  Attending or Consulting provider [place] or covering provider during after hours [place], for this patient?  1. Check the care team in [facility] and look for a) attending/consulting TRH provider listed and b) the [facility] team listed 2. Log into [email or web address] and use [place]'s universal password to access. If you do not have the password, please contact the hospital operator. 3. Locate the [facility] provider you are looking for under [facility] and page to a number that you can be directly  reached. 4. If you still have difficulty reaching the provider, please page the [facility] (Director on Call) for the Hospitalists listed on amion for assistance.

## 2019-04-08 NOTE — Plan of Care (Signed)
  Problem: Respiratory: Goal: Will maintain a patent airway Outcome: Progressing   

## 2019-04-09 DIAGNOSIS — R627 Adult failure to thrive: Secondary | ICD-10-CM

## 2019-04-09 LAB — VITAMIN B1: Vitamin B1 (Thiamine): 138.4 nmol/L (ref 66.5–200.0)

## 2019-04-09 LAB — GLUCOSE, CAPILLARY
Glucose-Capillary: 117 mg/dL — ABNORMAL HIGH (ref 70–99)
Glucose-Capillary: 128 mg/dL — ABNORMAL HIGH (ref 70–99)
Glucose-Capillary: 132 mg/dL — ABNORMAL HIGH (ref 70–99)
Glucose-Capillary: 70 mg/dL (ref 70–99)
Glucose-Capillary: 85 mg/dL (ref 70–99)

## 2019-04-09 MED ORDER — OSMOLITE 1.5 CAL PO LIQD
1000.0000 mL | ORAL | Status: DC
  Administered 2019-04-09 – 2019-04-11 (×2): 1000 mL
  Filled 2019-04-09 (×4): qty 1000

## 2019-04-09 NOTE — Progress Notes (Signed)
Nutrition Follow-up  DOCUMENTATION CODES:   Obesity unspecified  INTERVENTION:   -Initiate 48 hour calorie count per MD -Continue 500 mg vitamin C BID -Continue 220 mg zinc sulfate daily -MVI with minerals daily -Magic cup TID with meals, each supplement provides 290 kcal and 9 grams of protein -D/c Prostat -Nocturnal feedings:  Osmolite 1.5 @ 70 ml/hr via cortrak tube x 12 hours (2000-0800)  Tube feeding regimen provides 1260 kcal (73% of needs), 53 grams of protein, and 640 ml of H2O.   NUTRITION DIAGNOSIS:   Increased nutrient needs related to acute illness(COVID PNA) as evidenced by estimated needs.  Ongoing  GOAL:   Patient will meet greater than or equal to 90% of their needs  Met with TF  MONITOR:   PO intake, Supplement acceptance, Diet advancement, Labs, Weight trends, TF tolerance, Skin, I & O's  REASON FOR ASSESSMENT:   Consult Calorie Count  ASSESSMENT:   Pt with PMH of hypothyroidism, HTN, intellectual disability who lives in a group home. Pt admitted with AMS, poor oral intake, and COVID-19 PNA.  11/28 Admit  12/04 Intubated 12/14 Extubated 12/16 Cortrak placed-Gastric 12/17 CT abdomen with large new hemoperitoneum 12/18 Transferred to [facility] from [facility] 12/24- diet advanced to dysphagia 1 diet with nectar thick liquids   Reviewed I/O's: +60 ml x 24 hours and +6.9 L since 03/26/19  Pt transitioned to nocturnal feedings on 04/06/19: Osmolite 1.5 @ 100 ml/hr x 10 hours and 30 ml Prostat TID, which provides 1800 kcals, 108 grams protein, and 760 ml free water daily, meeting 100% of needs.   Per chart review and discussion with RN, pt is refusing to eat. RN offered pt items such as ice cream and applesauce, with pt refused. Observed pt meal tray, which was untouched. Attempted to engage pt in conversation, however, pt continued to yell out "Get out of here! Leave me alone!". Documented meal completion 0% since 04/05/19.   RD will decrease TF in  attempt to assist with appetite stimulation. However, suspect pt's mentation is the largest barrier to PO intake. RN agreeable to plan.   Labs reviewed: CBGS: 72-132 (inpatient orders for glycemic control are 0-15 units insulin aspart every 4 hours).   Diet Order:   Diet Order            DIET - DYS 1 Room service appropriate? Yes; Fluid consistency: Nectar Thick  Diet effective now              EDUCATION NEEDS:   No education needs have been identified at this time  Skin:  Skin Assessment: Reviewed RN Assessment Skin Integrity Issues:: Stage II Stage II: L buttock Other: MASD- perineum, groin  Last BM:  04/06/19  Height:   Ht Readings from Last 1 Encounters:  03/12/19 '5\' 2"'$  (1.575 m)    Weight:   Wt Readings from Last 1 Encounters:  04/08/19 88.2 kg    Ideal Body Weight:  50 kg  BMI:  Body mass index is 35.56 kg/m.  Estimated Nutritional Needs:   Kcal:  1725-2150  Protein:  100-125 gm  Fluid:  >/= 1.8 L    Rebecca Bruce A. Jimmye Norman, RD, LDN, [place] Registered Dietitian II Certified Diabetes Care and Education Specialist Pager: [phone] After hours Pager: [phone]

## 2019-04-09 NOTE — TOC Progression Note (Signed)
Transition of Care [facility]) - Progression Note    Patient Details  Name: Rebecca Bruce MRN: [id number] Date of Birth: 12-13-1960  Transition of Care [facility]) CM/SW [place], [place] Phone Number: 04/09/2019, 4:45 PM  Clinical Narrative:    CSW spoke with Gena Fray with [place] [phone]). Pt from [facility] where she was relatively independent with supervision. Pt brother is her legal guardian but, per Butch Penny, also has some health challenges. We discussed that pt needs to be able to transfer herself independently and be able to eat independently again.   CSW discussed with Butch Penny the challenges that we were facing with finding food that pt would eat/pt refusal to eat, and her apprehension with working with therapy. Since pt was not able to have visitors on her other floors they were not aware that pt could now have one visitor. We discussed that it has to be the same visitor all stay but that it may motivate her to have a person she knows visiting during the day and to have food that she likes. Butch Penny to f/u with group home to see if pt has preferred food, CSW spoke with [facility] and we discussed speaking with PT/OT teams to coordinate their visit with group home staff member if possible.   TOC team continues to follow.     Expected Discharge Plan: Group Home Barriers to Discharge: Continued Medical Work up, Other (comment)(pt with coretrak)  Expected Discharge Plan and Services Expected Discharge Plan: Group Home   Discharge Planning Services: CM Consult Post Acute Care Choice: [place] arrangements for the past 2 months: Group Home     Readmission Risk Interventions Readmission Risk Prevention Plan 04/09/2019  Transportation Screening Complete  PCP or Specialist Appt within 5-7 Days Not Complete  Not Complete comments pending medical stability  Home Care Screening Complete  Medication Review (RN CM) Referral to Pharmacy  Some recent data  might be hidden

## 2019-04-09 NOTE — TOC Progression Note (Signed)
Transition of Care [facility]) - Progression Note    Patient Details  Name: Rebecca Bruce MRN: [id number] Date of Birth: 07-09-1960  Transition of Care [facility]) CM/SW [place], [place] Phone Number: 04/09/2019, 11:47 AM  Clinical Narrative:    Patient transferred to 6N from 5W (was previously also on 61M and [place]). Patient resides at [facility], was COVID+ now out of window of infectiousness. TOC team to continue to follow for discharge needs. Aware barrier remains pt oral intake, continued coretrak feeding. Pt will need fl2 and discharge summary when stable for return to group home. Guardianship papers uploaded into epic upon review. Will reach out to group home RN per hand off.    Expected Discharge Plan: Group Home Barriers to Discharge: Continued Medical Work up  Expected Discharge Plan and Services Expected Discharge Plan: Group Home   Discharge Planning Services: CM Consult Post Acute Care Choice: [place] arrangements for the past 2 months: Group Home    Readmission Risk Interventions No flowsheet data found.

## 2019-04-09 NOTE — Progress Notes (Signed)
Patient is still refusing to eat for staff. Tech and RN attempted to feed her this am but she would not take anything by mouth.

## 2019-04-09 NOTE — Progress Notes (Signed)
PROGRESS NOTE  Rebecca Bruce [id number] DOB: 05/05/1960 DOA: 03/11/2019 PCP: Steele Sizer, MD  A & P  Acute encephalopathy unknown etiology, likely exacerbated COVID-19 with catatonia, history of mental retardation.  No evidence of CNS vasculitis at this time per neurology.   --Suspect at baseline.  Can return to group home when able to maintain nutrition and feeding tube removed.  Nutrition, adult failure to thrive.  Continue core track supplementation to meeting 60% of needs by mouth.  Once able to maintain hydration, nutrition can return to group home. --Continue management per dietitian including vitamin C, zinc, multivitamin, Magic cup, nocturnal feedings. --Patient refusing to eat per nursing.  0% meal consumption since 12/25. --Tube feed decreased to assist with appetite stimulation.  Agree that mental disability is likely largest barrier to oral intake.  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  Off pressors 12/18. --Hemoglobin last check 12/27 was stable.  COVID-19 pneumonia, extubated 12/14.  Completed dexamethasone and remdesivir. 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  Neurology, neurosurgery evaluated.  Neurosurgical follow intervention recommended.  Consider outpatient follow-up.  DVT prophylaxis: SCDs Code Status: Full Family Communication: none Disposition Plan: Return to group home   Murray Hodgkins, MD  [facility] Direct contact: see [email or web address] (further directions at bottom of note if needed) 7PM-7AM contact night coverage as at bottom of note 04/09/2019, 3:15 PM  LOS: 29 days   Significant Hospital Events   .    Consults:  .    Procedures:  .   Significant Diagnostic Tests:  Marland Kitchen    Micro Data:  .    Antimicrobials:    .   Interval History/Subjective  Patient does not offer coherent history.  Objective   Vitals:  Vitals:   04/08/19 2000 04/09/19 0348  BP: 135/87 114/73  Pulse: 85 97  Resp: 18 17  Temp: 99.1 F (37.3 C) 98 F (36.7 C)  SpO2: 100% 100%    Exam:  Constitutional.  Appears calm, comfortable. Cardiovascular.  Regular rate and rhythm.  No murmur, rub or gallop. Respiratory.  Clear to auscultation bilaterally.  No wheezes, rales or rhonchi. Abdomen.  Does not permit abdominal examination. Psychiatric.  Speech is inappropriate.  I have personally reviewed the following:   Today's Data  . CBG remains stable.  Scheduled Meds: . chlorhexidine  15 mL Mouth Rinse BID  . Chlorhexidine Gluconate Cloth  6 each Topical Daily  . docusate  100 mg Per Tube Daily  . free water  100 mL Per Tube Q8H  . guaiFENesin  5 mL Per Tube BID  . insulin aspart  0-15 Units Subcutaneous Q4H  . levothyroxine  100 mcg Per Tube Q0600  . mouth rinse  15 mL Mouth Rinse q12n4p  . sodium chloride flush  10-40 mL Intracatheter Q12H  . thiamine  100 mg Per Tube Daily  . vitamin C  500 mg Per Tube Daily  . zinc sulfate  220 mg Per Tube Daily   Continuous Infusions: . sodium chloride Stopped (03/12/19 1210)  . feeding supplement (OSMOLITE 1.5 CAL)      Active Problems:   Acute metabolic encephalopathy   Encounter for intubation   Catatonia   Acute respiratory failure with hypoxemia ([place])   COVID-19 virus detected   Pressure injury of skin   Acute blood loss anemia  Hemorrhagic shock ([place])   LOS: 29 days   How to contact the [facility] Attending or Consulting provider [place] or covering provider during after hours [place], for this patient?  1. Check the care team in [facility] and look for a) attending/consulting TRH provider listed and b) the [facility] team listed 2. Log into [email or web address] and use [place]'s universal password to access. If you do not have the password, please contact the hospital  operator. 3. Locate the [facility] provider you are looking for under [facility] and page to a number that you can be directly reached. 4. If you still have difficulty reaching the provider, please page the [facility] (Director on Call) for the Hospitalists listed on amion for assistance.

## 2019-04-10 LAB — BASIC METABOLIC PANEL
Anion gap: 8 (ref 5–15)
BUN: 27 mg/dL — ABNORMAL HIGH (ref 6–20)
CO2: 26 mmol/L (ref 22–32)
Calcium: 8.3 mg/dL — ABNORMAL LOW (ref 8.9–10.3)
Chloride: 105 mmol/L (ref 98–111)
Creatinine, Ser: 0.92 mg/dL (ref 0.44–1.00)
GFR calc Af Amer: >60 mL/min (ref >60)
GFR calc non Af Amer: >60 mL/min (ref >60)
Glucose, Bld: 106 mg/dL — ABNORMAL HIGH (ref 70–99)
Potassium: 3.9 mmol/L (ref 3.5–5.1)
Sodium: 139 mmol/L (ref 135–145)

## 2019-04-10 LAB — PHOSPHORUS: Phosphorus: 3.2 mg/dL (ref 2.5–4.6)

## 2019-04-10 LAB — MAGNESIUM: Magnesium: 2 mg/dL (ref 1.7–2.4)

## 2019-04-10 LAB — GLUCOSE, CAPILLARY
Glucose-Capillary: 100 mg/dL — ABNORMAL HIGH (ref 70–99)
Glucose-Capillary: 103 mg/dL — ABNORMAL HIGH (ref 70–99)
Glucose-Capillary: 110 mg/dL — ABNORMAL HIGH (ref 70–99)
Glucose-Capillary: 142 mg/dL — ABNORMAL HIGH (ref 70–99)
Glucose-Capillary: 75 mg/dL (ref 70–99)
Glucose-Capillary: 79 mg/dL (ref 70–99)

## 2019-04-10 MED ORDER — ENSURE ENLIVE PO LIQD
237.0000 mL | Freq: Three times a day (TID) | ORAL | Status: DC
  Administered 2019-04-10 – 2019-04-21 (×21): 237 mL via ORAL

## 2019-04-10 MED ORDER — ADULT MULTIVITAMIN W/MINERALS CH
1.0000 | ORAL_TABLET | Freq: Every day | ORAL | Status: DC
  Administered 2019-04-10 – 2019-04-21 (×12): 1 via ORAL
  Filled 2019-04-10 (×13): qty 1

## 2019-04-10 NOTE — Progress Notes (Signed)
Speech Language Pathology Treatment: Dysphagia  Patient Details Name: Rebecca Bruce MRN: [id number] DOB: 1961-01-27 Today's Date: 04/10/2019 Time: 0935-1000 SLP Time Calculation (min) (ACUTE ONLY): 25 min  Assessment / Plan / Recommendation Clinical Impression  Pt was seen for skilled ST targeting dysphagia goals.  Upon arrival, pt was awake and alert with gown around her waist but initially refusing to allow any care.  Pt appeared fearful and shouted out when she felt that therapist was about to touch her saying "No, don't hurt me!"  Pt also was easily frightened by loud noises in the room (the sound of the bed rail being lowered or dropping of the call bell onto the floor) But with increased time, careful but simple explanation of what therapist was doing, and gentle encouragement pt allowed therapist to reposition her in bed, fix her gown so that she was no longer exposed, and initiate trials of advanced consistencies.  Pt responded well to bargaining or "making a deal" as she wanted to watch TV during today's session ("I will turn on the TV for you if you have a few bites/sips") Pt even requested to "do it herself" when taking sips of water.  Pt had no overt s/s of aspiration with sips of water via straw with the exception of one immediate cough following initial bolus of mixed solid and liquid consistencies.  Pt voluntarily consumed an entire container of applesauce with graham crackers mixed into it and demonstrated no difficulty containing, chewing, or clearing dys 2 solids from the oral cavity.  Pt's voice was clear and strong both prior to and following PO trials.  As a result, I would recommend advancing pt to dys 2 textures and thin liquids with full supervision for use of swallowing precautions.  SLP will follow up for toleration.  Continue per current plan of care.    HPI HPI: 58 y.o. female with PMHx of hypothyroidism, HTN, intellectual disability-lives in a group home for the past  20 years (normally fairly independent and talks)-brought in to [facility] on 11/28 for altered mental status, poor oral intake-further evaluation revealed COVID-19 pneumonia.  Encephalopathic, leading to clinical swallow evaluation 12/01 - noted that pt visually tracked people in room; did not initiate movement or recognize/manipulate POs.  Pt was intubated 12/4-14.  Dx include severe catatonic reaction of uncertain etiology, ARF with hypoxemia.      SLP Plan  Continue with current plan of care       Recommendations  Diet recommendations: Dysphagia 2 (fine chop);Thin liquid Liquids provided via: Straw Medication Administration: Via alternative means Supervision: Full supervision/cueing for compensatory strategies Compensations: Slow rate;Small sips/bites Postural Changes and/or Swallow Maneuvers: Seated upright 90 degrees                Oral Care Recommendations: Oral care BID Follow up Recommendations: Other (comment)(group home) SLP Visit Diagnosis: Dysphagia, unspecified (R13.10) Plan: Continue with current plan of care       GO                Shakora Nordquist, Melanee Spry 04/10/2019, 10:12 AM

## 2019-04-10 NOTE — Progress Notes (Signed)
PROGRESS NOTE  Rebecca Bruce [id number] DOB: 1960-11-10 DOA: 03/11/2019 PCP: Rebecca Sizer, MD  A & P  Acute encephalopathy unknown etiology, likely exacerbated COVID-19 with catatonia, history of mental retardation.  - No evidence of acute CNS findings/vasculitis at this time per neurology.   - Suspect she is at baseline.  - Can return to group home when able to maintain nutrition and feeding tube removed.  Nutrition, adult failure to thrive.  - Continue core track supplementation to meeting 60% of needs by mouth.   - Once able to maintain hydration, nutrition can return to group home. - Continue management per dietitian including vitamin C, zinc, multivitamin, Magic cup, nocturnal feedings. - Patient refusing to eat per nursing.  Very little PO intake since 12/25. - Tube feed decreased to assist with appetite stimulation.  Agree that mental disability is likely largest barrier to oral intake. May need to stop tube feedings all together to encourage patient to take PO.  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  Off pressors 12/18. --Hemoglobin last check 12/27 was stable.  COVID-19 pneumonia, extubated 12/14.  Completed dexamethasone and remdesivir. 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  Neurology, neurosurgery evaluated.  Neurosurgical follow intervention recommended.  Consider outpatient follow-up.  DVT prophylaxis: SCDs Code Status: Full Family Communication: none Disposition Plan: Return to group home once medically stable and able to tolerate PO adequately.  Rebecca Humbles, DO  [facility] Direct contact: see [email or web address] (further directions at bottom of note if needed) 7PM-7AM contact night coverage as at bottom of note 04/10/2019, 7:47 AM   LOS: 30 days   Interval History/Subjective  No acute issues or events overnight per nursing, patient continues to refuse p.o. intake.  Patient's review of systems is markedly limited given her mental status baseline.  She appears to understand that she cannot leave without eating but continues to refuse diet despite our encouragement.  Objective   Vitals:  Vitals:   04/09/19 1711 04/09/19 2155  BP: 122/85 107/74  Pulse: 83 93  Resp: 18 16  Temp: 98.6 F (37 C) 98.6 F (37 C)  SpO2: 95% 100%    Exam:  Constitutional.  Appears calm, comfortable. Cardiovascular.  Regular rate and rhythm.  No murmur, rub or gallop. Respiratory.  Clear to auscultation bilaterally.  No wheezes, rales or rhonchi. Abdomen.  Does not permit abdominal examination. Psychiatric.  Speech is inappropriate, poor comprehension of medical decision making.  I have personally reviewed the following:   Today's Data  . CBG remains stable.  Scheduled Meds: . chlorhexidine  15 mL Mouth Rinse BID  . Chlorhexidine Gluconate Cloth  6 each Topical Daily  . docusate  100 mg Per Tube Daily  . free water  100 mL Per Tube Q8H  . guaiFENesin  5 mL Per Tube BID  . insulin aspart  0-15 Units Subcutaneous Q4H  . levothyroxine  100 mcg Per Tube Q0600  . mouth rinse  15 mL Mouth Rinse q12n4p  . sodium chloride flush  10-40 mL Intracatheter Q12H  . thiamine  100 mg Per Tube Daily  . vitamin C  500 mg Per Tube Daily  . zinc sulfate  220 mg Per Tube Daily   Continuous Infusions: . sodium chloride Stopped (03/12/19 1210)  . feeding supplement (OSMOLITE 1.5 CAL) 1,000 mL (04/09/19 2009)  Active Problems:   Acute metabolic encephalopathy   Encounter for intubation   Catatonia   Acute respiratory failure with hypoxemia ([place])   COVID-19 virus detected   Pressure injury of skin   Acute blood loss anemia   Hemorrhagic shock (H[facility])   FTT (failure to thrive) in adult   LOS: 30 days   How to contact the [facility] Attending  or Consulting provider [place] or covering provider during after hours [place], for this patient?  1. Check the care team in [facility] and look for a) attending/consulting TRH provider listed and b) the [facility] team listed 2. Log into [email or web address] and use Rebecca Bruce's universal password to access. If you do not have the password, please contact the hospital operator. 3. Locate the [facility] provider you are looking for under [facility] and page to a number that you can be directly reached. 4. If you still have difficulty reaching the provider, please page the [facility] (Director on Call) for the Hospitalists listed on amion for assistance.

## 2019-04-10 NOTE — Progress Notes (Signed)
Calorie Count Note  RD working remotely.  48 hour calorie count ordered.  Diet: dysphagia 2 diet with thin liquids Supplements: Magic cup BID with meals, each supplement provides 290 kcal and 9 grams of protein; Osmolite 1.5 @ 70 ml/hr via cortrak tube x 12 hours (2000-0800)  Attempted to speak with pt on the phone, however, no answer.   Chart reviewed. Noted pt underwent BSE this morning and diet was advanced to dysphagia 2 diet with thin liquids. Per SLP notes, pt was able to eat using bargaining tactics (pt consumed applesauce and graham crackers during SLP visit (approximately 200 kcals).   RD will continue calorie count for today. Since diet has been advanced, hopeful that ioncreased food choices and palatability of food options will help promote adequate oral intake. Per CSW notes, staff member from group home is encouraged to visit pt to assist with progress.   04/09/19 Breakfast: pt refused Lunch: pt refused Dinner: pt refused  Total intake: 0 kcal (0% of minimum estimated needs)  0 protein (0% of minimum estimated needs)  04/10/19 Breakfast: pt refused, but consumed 200 kcals with SLP during eval  Nutrition Dx: Increased nutrient needs related to acute illness(COVID PNA) as evidenced by estimated needs; ongoing  Goal: Patient will meet greater than or equal to 90% of their needs; unmet  Intervention:   -Continue 48 hour calorie count -Continue 500 mg vitamin C BID -Continue 220 mg zinc sulfate daily -Continue MVI with minerals daily -Continue Magic cup BID with meals, each supplement provides 290 kcal and 9 grams of protein -Ensure Enlive po TID, each supplement provides 350 kcal and 20 grams of protein -Continue Nocturnal feedings:  Osmolite 1.5 @ 70 ml/hr via cortrak tube x 12 hours (2000-0800)  Tube feeding regimen provides 1260 kcal (73% of needs), 53 grams of protein, and 640 ml of H2O.    Tayonna Bacha A. Jimmye Norman, RD, LDN, [place] Registered Dietitian  II Certified Diabetes Care and Education Specialist Pager: [phone] After hours Pager: [phone]

## 2019-04-10 NOTE — Progress Notes (Signed)
Physical Therapy Treatment Patient Details Name: Rebecca Bruce MRN: [id number] DOB: 1960-12-01 Today's Date: 04/10/2019    History of Present Illness Pt is a 58 y.o. female admitted from group home to [facility] on 03/09/19 with AMS, poor oral intake; (+) COVID-19. Pt with progressive increase in acute encephalopathy . 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 Elevated CSF protein is compatible with but not diagnostic of a resolving viral encephalitis.  onia. Brain MRI 12/3 without acute abnormality. ETT 12/4-12/14. 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  PMH includes intellectual disability, HTN.    PT Comments    Pt admitted for above. Pt alert but resistant to mobility. Made several attempts by using strategies such as verbal cues, tactile cues, mirroring, telling her the importance of mobility, letting her know we only want to help her, and attempting to reposition her in the bed, but all were unsuccessful. Helped pt wipe her face as she had food around her mouth. Pt denying pain and demonstrated active use of her extremities when pulling away from tactile cues, so cognition is the main factor limiting her participation at this time. Pt would benefit from continued skilled PT intervention to address deficits.   Follow Up Recommendations  SNF     Equipment Recommendations  Wheelchair cushion (measurements PT);Wheelchair (measurements PT)    Recommendations for Other Services       Precautions / Restrictions Precautions Precautions: Fall    Mobility  Bed Mobility               General bed mobility comments: pt declined several times  Transfers                 General transfer  comment: pt declined  Ambulation/Gait                 Stairs             Wheelchair Mobility    Modified Rankin (Stroke Patients Only)       Balance                                            Cognition Arousal/Alertness: Awake/alert Behavior During Therapy: Flat affect Overall Cognitive Status: No family/caregiver present to determine baseline cognitive functioning Area of Impairment: Attention;Following commands;Awareness;Safety/judgement;Problem solving                   Current Attention Level: Sustained   Following Commands: Follows one step commands inconsistently Safety/Judgement: Decreased awareness of safety;Decreased awareness of deficits Awareness: Intellectual Problem Solving: Requires tactile cues;Requires verbal cues General Comments: Pt alert but became agitated quickly and continued to state "No!" to almost every question or sentence      Exercises      General Comments        Pertinent Vitals/Pain Pain Assessment: No/denies pain    Home Living                      Prior Function            PT Goals (current goals can now be found in the care plan  section) Acute Rehab PT Goals Patient Stated Goal: Pt unable to state PT Goal Formulation: Patient unable to participate in goal setting Time For Goal Achievement: 04/10/19 Potential to Achieve Goals: Fair Progress towards PT goals: Not progressing toward goals - comment(pt refused all mobility)    Frequency    Min 2X/week      PT Plan Current plan remains appropriate    Co-evaluation              AM-PAC PT "6 Clicks" Mobility   Outcome Measure  Help needed turning from your back to your side while in a flat bed without using bedrails?: A Lot Help needed moving from lying on your back to sitting on the side of a flat bed without using bedrails?: A Lot Help needed moving to and from a bed to a chair (including a wheelchair)?:  Total Help needed standing up from a chair using your arms (e.g., wheelchair or bedside chair)?: Total Help needed to walk in hospital room?: Total Help needed climbing 3-5 steps with a railing? : Total 6 Click Score: 8    End of Session   Activity Tolerance: Treatment limited secondary to agitation Patient left: in bed;with call bell/phone within reach;with bed alarm set Nurse Communication: Mobility status PT Visit Diagnosis: Other symptoms and signs involving the nervous system (R29.898)     Time: [id number] PT Time Calculation (min) (ACUTE ONLY): 15 min  Charges:  $Self Care/Home Management: 8-22                    Christel Mormon, SPT    Silvana Newness 04/10/2019, 12:46 PM

## 2019-04-10 NOTE — Plan of Care (Signed)
  Problem: Pain Managment: Goal: General experience of comfort will improve Outcome: Progressing   Problem: Safety: Goal: Ability to remain free from injury will improve Outcome: Progressing   Problem: Skin Integrity: Goal: Risk for impaired skin integrity will decrease Outcome: Progressing   

## 2019-04-11 LAB — GLUCOSE, CAPILLARY
Glucose-Capillary: 112 mg/dL — ABNORMAL HIGH (ref 70–99)
Glucose-Capillary: 138 mg/dL — ABNORMAL HIGH (ref 70–99)
Glucose-Capillary: 58 mg/dL — ABNORMAL LOW (ref 70–99)
Glucose-Capillary: 130 mg/dL — ABNORMAL HIGH (ref 70–99)
Glucose-Capillary: 85 mg/dL (ref 70–99)
Glucose-Capillary: 113 mg/dL — ABNORMAL HIGH (ref 70–99)

## 2019-04-11 MED ORDER — OSMOLITE 1.5 CAL PO LIQD
1000.0000 mL | ORAL | Status: DC
  Filled 2019-04-11: qty 1000

## 2019-04-11 MED ORDER — DEXTROSE 50 % IV SOLN
INTRAVENOUS | Status: AC
  Filled 2019-04-11: qty 50

## 2019-04-11 NOTE — Progress Notes (Signed)
PROGRESS NOTE  Rebecca Bruce [id number] DOB: 09/15/1960 DOA: 03/11/2019 PCP: Steele Sizer, MD  Hospital Summary  58 y/o female with history of hypothyroid, htn, intellectual debility [grp home resident for 20 yr] brought to [facility] on 11/28 in the setting of altered mental status from her group home. She was found to have COVID pneumonia, treated with decadron and remdesivir then sent to [facility] on 11/30 - progressive increase in acute encephalopathy and has become less and less responsive.  12/4 PCCM asked to see her because she reached the point that she was aspirating and not responding to pharyngeal/tracheal suctioning.  exam findings have waxed and waned over the last three days but have not generally moved in the right direction.  Neurology saw her on 12/2 and apparently at that time she followed some simple commands, but she was non-verbal at the time with significant rigidity noted in her upper extremities.   At baseline lives in a group home, can bathe herself and have a conversation. Has baseline anxiety and depression. 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   She is transferred from [place] [facility] 12/18 due to finding of hemoperitoneum for emergent angiogram  Significant events: 11/28 Admission [facility] 11/30 Transfer to [facility] 12/04 Intubation for inability to protect airway 12/05 Diprivan, fentanyl gtt. Suctioned large plug by RT >>Peak pressure improved. 12/12 Tx to [place]. Sedation decreased but put back on fentanyl drip overnight for tachycardia. Failed SBT 12/13 PSV wean, mental status barrier to extubation 12/14: Extubated.  12/16: Pressor demands down after volume, stress steroids, and art line placement.  12/17: Hemoglobin acutely dropped from 11.6 down to 3.9, received 2 units of blood, CT abdomen pelvis showing large new hemoperitoneum involving the left upper quadrant of the abdomen near the  splenic flexure of the colon etiology was not clear. All anticoagulation was discontinued. 12/18: Received another unit of blood overnight, hemoglobin had gone from 7.4 down to 6.9, with no bump following transfusion. General surgery consulted. Interventional radiology also consulted. Tr to [employer]  A [facility] P    Acute encephalopathy unknown etiology, likely exacerbated COVID-19 with catatonia, history of mental retardation, improving.  - No evidence of acute CNS findings/vasculitis at this time per neurology.   - Suspect she is at baseline.  - Can return to group home when able to maintain nutrition and assist with transfers  Nutrition, adult failure to thrive.  -Discontinue core track -patient able to tolerate p.o. this morning without difficulty after much coercion - Once able to maintain hydration, nutrition can return to group home. - Continue management per dietitian including vitamin C, zinc, multivitamin, Magic cup, nocturnal feedings. -Very poor p.o. 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  Off pressors 12/18. --Hemoglobin last check 12/27 was stable.  COVID-19 pneumonia, extubated 12/14, POA, Resolved.  Completed dexamethasone and remdesivir. 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  Neurosurgery evaluated. Recommending further outpatient follow-up.  DVT prophylaxis: SCDs Code Status: Full Family Communication: none present Disposition Plan: Return to group home once medically stable and able to tolerate PO adequately -likely next 24 to 48 hours.  [name], DO  [facility]  Direct contact: see [email or web address]  (further directions at bottom of note if needed) 7PM-7AM contact night coverage as at bottom of note 04/11/2019, 8:13 AM  LOS: 31 days   Interval History/Subjective  No acute issues or events overnight.  This morning at bedside patient easily took protein shake for me without dysphagia, coughing or complaint.  Review of systems remains somewhat limited given her mental status.  Objective   Vitals:  Vitals:   04/10/19 2235 04/11/19 0504  BP: 124/81 113/88  Pulse: 89 88  Resp: 20 16  Temp: 98.6 F (37 C) 99 F (37.2 C)  SpO2: 100% 100%    Exam:  Constitutional.  Appears calm, comfortable. Cardiovascular.  Regular rate and rhythm.  No murmur, rub or gallop. Respiratory.  Clear to auscultation bilaterally.  No wheezes, rales or rhonchi. Abdomen.  Does not permit abdominal examination. Psychiatric.  Speech is inappropriate, poor comprehension of medical decision making.  I have personally reviewed the following:   Today's Data  . CBG remains stable.  Scheduled Meds: . chlorhexidine  15 mL Mouth Rinse BID  . Chlorhexidine Gluconate Cloth  6 each Topical Daily  . docusate  100 mg Per Tube Daily  . feeding supplement (ENSURE ENLIVE)  237 mL Oral TID BM  . free water  100 mL Per Tube Q8H  . guaiFENesin  5 mL Per Tube BID  . insulin aspart  0-15 Units Subcutaneous Q4H  . levothyroxine  100 mcg Per Tube Q0600  . mouth rinse  15 mL Mouth Rinse q12n4p  . multivitamin with minerals  1 tablet Oral Daily  . sodium chloride flush  10-40 mL Intracatheter Q12H  . thiamine  100 mg Per Tube Daily  . vitamin C  500 mg Per Tube Daily  . zinc sulfate  220 mg Per Tube Daily   Continuous Infusions: . sodium chloride Stopped (03/12/19 1210)  . feeding supplement (OSMOLITE 1.5 CAL) 1,000 mL (04/11/19 0152)    Active Problems:   Acute metabolic encephalopathy   Encounter for intubation   Catatonia   Acute respiratory failure with hypoxemia ([place])   COVID-19 virus detected   Pressure  injury of skin   Acute blood loss anemia   Hemorrhagic shock (HCC)   FTT (failure to thrive) in adult   LOS: 31 days   How to contact the [facility] Attending or Consulting provider [place] or covering provider during after hours [place], for this patient?  1. Check the care team in [facility] and look for a) attending/consulting TRH provider listed and b) the [facility] team listed 2. Log into [email or web address] and use [place]'s universal password to access. If you do not have the password, please contact the hospital operator. 3. Locate the [facility] provider you are looking for under [facility] and page to a number that you can be directly reached. 4. If you still have difficulty reaching the provider, please page the [facility] (Director on Call) for the Hospitalists listed on amion for assistance.

## 2019-04-11 NOTE — Progress Notes (Signed)
  Speech Language Pathology Treatment: Dysphagia  Patient Details Name: Rebecca Bruce MRN: [id number] DOB: 29-Aug-1960 Today's Date: 04/11/2019 Time: 1701-1710 SLP Time Calculation (min) (ACUTE ONLY): 9 min  Assessment / Plan / Recommendation Clinical Impression  Pt seen at bedside for skilled ST targeting dysphagia. Pt alert, sitting upright in bed. Pt somewhat reluctant to consume PO, but agreeable to cup sips of water and iced tea and agreeable to eating magic cup.  Pt with good oral acceptance of thin liquids, no overt s/s aspiration or distress seen with cup sips. Pt seen with 5 spoon bites of puree (magic cup), patient with some oral holding in 2 instances (seemingly d/t distractibility of people walking by the room), but able to initiate and swallow with good oral clearance. Pt with 1 instance of delayed throat clear following bite of puree solids. Patient refusing trials of other solids aside from the magic cup when seen today.  Recommend continue dysphagia 2, thin liquids with supervision.   HPI HPI: 58 y.o. female with PMHx of hypothyroidism, HTN, intellectual disability-lives in a group home for the past 20 years (normally fairly independent and talks)-brought in to [facility] on 11/28 for altered mental status, poor oral intake-further evaluation revealed COVID-19 pneumonia.  Encephalopathic, leading to clinical swallow evaluation 12/01 - noted that pt visually tracked people in room; did not initiate movement or recognize/manipulate POs.  Pt was intubated 12/4-14.  Dx include severe catatonic reaction of uncertain etiology, ARF with hypoxemia.      SLP Plan  Continue with current plan of care       Recommendations  Diet recommendations: Thin liquid;Dysphagia 2 (fine chop) Liquids provided via: Cup Medication Administration: Other (Comment)(via alternate means or crushed in puree) Supervision: Full supervision/cueing for compensatory strategies Compensations: Slow rate;Small  sips/bites                Oral Care Recommendations: Oral care BID SLP Visit Diagnosis: Dysphagia, unspecified (R13.10) Plan: Continue with current plan of care       [place], M.Ed., CCC-SLP Speech Therapy Acute Rehabilitation 04/11/2019, 5:17 PM

## 2019-04-11 NOTE — Progress Notes (Signed)
Occupational Therapy Treatment Patient Details Name: Rebecca Bruce MRN: [id number] DOB: 1961-02-18 Today's Date: 04/11/2019    History of present illness Pt is a 58 y.o. female admitted from group home to [facility] on 03/09/19 with AMS, poor oral intake; (+) COVID-19. Pt with progressive increase in acute encephalopathy . 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 Elevated CSF protein is compatible with but not diagnostic of a resolving viral encephalitis.  onia. Brain MRI 12/3 without acute abnormality. ETT 12/4-12/14. 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  PMH includes intellectual disability, HTN.   OT comments  Pt in bed upon arrival. Cognition continues to be main factor in pt's limited participation. Pt required max encouragement to sit EOB with increased time and effort/assist. Pt saying "no" to most activity. Pt sat EOB x 2 minutes, hand over hand assist to wash face, then attempted return to supine. OT will continue to follow acutely  Follow Up Recommendations  SNF;Supervision/Assistance - 24 hour    Equipment Recommendations  None recommended by OT    Recommendations for Other Services      Precautions / Restrictions Precautions Precautions: Fall Precaution Comments: can be combative/aggressive, MR at baseline Restrictions Weight Bearing Restrictions: No       Mobility Bed Mobility Overal bed mobility: Needs Assistance       Supine to sit: Mod assist;+2 for physical assistance Sit to supine: Mod assist;+2 for physical assistance   General bed mobility comments: pt declined several times, required max encouragement and increased time. Sat EOB x 1 1/2 minutes and started  atttempting to return to supine  Transfers                 General transfer comment: pt declined    Balance Overall balance assessment: Needs assistance Sitting-balance support: Feet supported;Bilateral upper extremity supported Sitting balance-Leahy Scale: Poor Sitting balance - Comments: reliant on external support to maintain EOB balance x 2 minutes.                                   ADL either performed or assessed with clinical judgement   ADL Overall ADL's : Needs assistance/impaired     Grooming: Moderate assistance Grooming Details (indicate cue type and reason): With hand over hand assist, using L hand and cues x 2                               General ADL Comments: pt followed 1 step command 1/4 trials with multimodal cues and redirection     Vision Patient Visual Report: No change from baseline     Perception     Praxis      Cognition Arousal/Alertness: Awake/alert Behavior During Therapy: Flat affect Overall Cognitive Status: No family/caregiver present to determine baseline cognitive functioning Area of Impairment: Attention;Following commands;Awareness;Safety/judgement;Problem solving                       Following Commands: Follows one step commands inconsistently Safety/Judgement: Decreased awareness of safety;Decreased awareness of deficits   Problem Solving: Requires tactile cues;Requires verbal cues General Comments: Pt alert but became  agitated quickly and continued to state "No!" to almost every question or sentence        Exercises     Shoulder Instructions       General Comments      Pertinent Vitals/ Pain       Pain Assessment: No/denies pain Pain Score: 0-No pain Pain Intervention(s): Monitored during session  Home Living                                          Prior Functioning/Environment              Frequency  Min 2X/week        Progress Toward  Goals  OT Goals(current goals can now be found in the care plan section)  Progress towards OT goals: OT to reassess next treatment     Plan Discharge plan remains appropriate    Co-evaluation                 AM-PAC OT "6 Clicks" Daily Activity     Outcome Measure   Help from another person eating meals?: A Lot Help from another person taking care of personal grooming?: Total Help from another person toileting, which includes using toliet, bedpan, or urinal?: Total Help from another person bathing (including washing, rinsing, drying)?: Total Help from another person to put on and taking off regular upper body clothing?: Total Help from another person to put on and taking off regular lower body clothing?: Total 6 Click Score: 7    End of Session    OT Visit Diagnosis: Unsteadiness on feet (R26.81);Other abnormalities of gait and mobility (R26.89);Muscle weakness (generalized) (M62.81);Hemiplegia and hemiparesis;Other symptoms and signs involving cognitive function;Pain   Activity Tolerance Other (comment)(impaired cognition, behavior, agitation)   Patient Left     Nurse Communication          Time: [id number] OT Time Calculation (min): 19 min  Charges: OT General Charges $OT Visit: 1 Visit OT Treatments $Therapeutic Activity: 8-22 mins     Britt Bottom 04/11/2019, 3:00 PM

## 2019-04-11 NOTE — Progress Notes (Signed)
Calorie Count Note  48 hour calorie count ordered.  Diet: dysphagia 2 diet with thin liquids Supplements: Magic cup BID with meals, each supplement provides 290 kcal and 9 grams of protein; Osmolite 1.5 @ 70 ml/hr via cortrak tube x 12 hours (2000-0800)  Pt more alert today than on previous visit. Pt remains flat; noted that pt had an Ensure supplement in her hand, which she said she liked. Noted pt consumed about 2/3 of supplement. Breakfast tray remains untouched. Encouraged pt to eat and offered to set pt up for breakfast, however, pt refused.   04/09/19 Breakfast: pt refused Lunch: pt refused Dinner: pt refused  Total intake: 0 kcal (0% of minimum estimated needs)  0 protein (0% of minimum estimated needs)  04/10/19 Breakfast: pt refused, but consumed 200 kcals with SLP during eval Lunch: 0% Dinner: 156 kcals, 7 grams protein Supplements: 2 Ensure supplements (~420 kcals, 12 grams protein)  Total intake: 776 kcal (45% of minimum estimated needs)  19 grams protein (19% of minimum estimated needs)  Nutrition Dx: Increased nutrient needsrelated to acute illness(COVID PNA)as evidenced by estimated needs; ongoing  Goal: Patient will meet greater than or equal to 90% of their needs; unmet  Intervention:   -D/c calorie count -Continue MVI with minerals daily -Continue Magic cup BID with meals, each supplement provides 290 kcal and 9 grams of protein -Ensure Enlive po TID, each supplement provides 350 kcal and 20 grams of protein -Continue Nocturnal feedings:  Osmolite 1.5 @ 55 ml/hr via cortrak tube x 12 hours (2000-0800)  Tube feeding regimen provides990kcal (57% of needs),41grams of protein, and 525ml of H2O.   Merrik [place] A. Jimmye Norman, RD, LDN, [place] Registered Dietitian II Certified Diabetes Care and Education Specialist Pager: [phone] After hours Pager: [phone]

## 2019-04-12 LAB — GLUCOSE, CAPILLARY
Glucose-Capillary: 125 mg/dL — ABNORMAL HIGH (ref 70–99)
Glucose-Capillary: 126 mg/dL — ABNORMAL HIGH (ref 70–99)
Glucose-Capillary: 129 mg/dL — ABNORMAL HIGH (ref 70–99)
Glucose-Capillary: 133 mg/dL — ABNORMAL HIGH (ref 70–99)
Glucose-Capillary: 151 mg/dL — ABNORMAL HIGH (ref 70–99)

## 2019-04-12 MED ORDER — INSULIN ASPART 100 UNIT/ML ~~LOC~~ SOLN: 0.0000 [IU] | Freq: Every day | SUBCUTANEOUS | Status: DC

## 2019-04-12 MED ORDER — INSULIN ASPART 100 UNIT/ML ~~LOC~~ SOLN
0.0000 [IU] | Freq: Three times a day (TID) | SUBCUTANEOUS | Status: DC
  Administered 2019-04-13: 09:00:00 2 [IU] via SUBCUTANEOUS
  Administered 2019-04-16: 08:00:00 3 [IU] via SUBCUTANEOUS
  Administered 2019-04-16 – 2019-04-19 (×3): 2 [IU] via SUBCUTANEOUS

## 2019-04-12 MED ORDER — METOPROLOL TARTRATE 5 MG/5ML IV SOLN
5.0000 mg | Freq: Once | INTRAVENOUS | Status: AC
  Administered 2019-04-12: 22:00:00 5 mg via INTRAVENOUS
  Filled 2019-04-12: qty 5

## 2019-04-12 NOTE — Progress Notes (Signed)
PROGRESS NOTE  Rebecca Bruce [id number] DOB: 05/05/1960 DOA: 03/11/2019 PCP: Steele Sizer, MD  Hospital Summary  59 y/o female with history of hypothyroid, htn, intellectual debility [grp home resident for 20 yr] brought to [facility] on 11/28 in the setting of altered mental status from her group home. She was found to have COVID pneumonia, treated with decadron and remdesivir then sent to [facility] on 11/30 - progressive increase in acute encephalopathy and has become less and less responsive.  12/4 PCCM asked to see her because she reached the point that she was aspirating and not responding to pharyngeal/tracheal suctioning.  exam findings have waxed and waned over the last three days but have not generally moved in the right direction.  Neurology saw her on 12/2 and apparently at that time she followed some simple commands, but she was non-verbal at the time with significant rigidity noted in her upper extremities.   At baseline lives in a group home, can bathe herself and have a conversation. Has baseline anxiety and depression. 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   She is transferred from [place] long 12/18 due to finding of hemoperitoneum for emergent angiogram  Significant events: 11/28 Admission [facility] 11/30 Transfer to [facility] 12/04 Intubation for inability to protect airway 12/05 Diprivan, fentanyl gtt. Suctioned large plug by RT >>Peak pressure improved. 12/12 Tx to [place]. Sedation decreased but put back on fentanyl drip overnight for tachycardia. Failed SBT 12/13 PSV wean, mental status barrier to extubation 12/14: Extubated.  12/16: Pressor demands down after volume, stress steroids, and art line placement.  12/17: Hemoglobin acutely dropped from 11.6 down to 3.9, received 2 units of blood, CT abdomen pelvis showing large new hemoperitoneum involving the left upper quadrant of the abdomen near the  splenic flexure of the colon etiology was not clear. All anticoagulation was discontinued. 12/18: Received another unit of blood overnight, hemoglobin had gone from 7.4 down to 6.9, with no bump following transfusion. General surgery consulted. Interventional radiology also consulted. Tr to [employer]  A [employer] P    Acute encephalopathy unknown etiology, likely exacerbated COVID-19 with catatonia, history of mental retardation, improving.  - No evidence of acute CNS findings/vasculitis at this time per neurology.   - Suspect she is at baseline.  - Can return to group home when able to maintain nutrition and assist with transfers  Nutrition, adult failure to thrive, resolving  -PO tolerance greatly improved after removal of NG/core track - Once able to maintain hydration, nutrition can return to group home. - Continue management per dietitian including vitamin C, zinc, multivitamin, Magic cup, nocturnal feedings.  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   - Off pressors 12/18. --Hemoglobin last check 12/27 was stable - no longer following.  COVID-19 pneumonia, extubated 12/14, POA, Resolved.  Completed dexamethasone and remdesivir. Transferred to medical floor 12/26, off isolation, past 21-day infectious window  Ambulatory dysfunction/debility/weakness Likely 2/2 poor nutritional support Follow PT/OT  Can return to facility when able to assist with ADLs/Transfers safely  Severe multilevel cervical disc degeneration stenosis on MRI.  Neurosurgery evaluated. Recommending further outpatient follow-up.  DVT prophylaxis: SCDs Code Status: Full Family Communication: none present Disposition Plan: Return to group home once medically stable and able to tolerate PO adequately and assist w/ ambulation/transfers -likely next 24 to 48 hours.  [name], DO  [facility] Direct contact: see [email or web address] (further directions at bottom of note if needed) 7PM-7AM contact night coverage as at bottom of note 04/12/2019, 8:04 AM  LOS: 32 days   Interval History/Subjective  No acute issues or events overnight, patient more verbal this morning than previous, appears agitated but otherwise review systems markedly limited given patient's baseline mental status.  Objective   Vitals:  Vitals:   04/11/19 1350 04/11/19 2048  BP: 120/80 116/82  Pulse: 91 87  Resp: 16 17  Temp: 98.3 F (36.8 C) 98 F (36.7 C)  SpO2: 98% 97%    Exam:  Constitutional.  Appears calm, comfortable. Cardiovascular.  Regular rate and rhythm.  No murmur, rub or gallop. Respiratory.  Clear to auscultation bilaterally.  No wheezes, rales or rhonchi. Abdomen.  Does not permit abdominal examination. Psychiatric.  Speech is inappropriate, poor comprehension of medical decision making.  I have personally reviewed the following:   Today's Data  . CBG remains stable.  Scheduled Meds: . chlorhexidine  15 mL Mouth Rinse BID  . Chlorhexidine Gluconate Cloth  6 each Topical Daily  . docusate  100 mg Per Tube Daily  . feeding supplement (ENSURE ENLIVE)  237 mL Oral TID BM  . guaiFENesin  5 mL Per Tube BID  . insulin aspart  0-15 Units Subcutaneous Q4H  . levothyroxine  100 mcg Per Tube Q0600  . mouth rinse  15 mL Mouth Rinse q12n4p  . multivitamin with minerals  1 tablet Oral Daily  . sodium chloride flush  10-40 mL Intracatheter Q12H  . thiamine  100 mg Per Tube Daily  . vitamin C  500 mg Per Tube Daily  . zinc sulfate  220 mg Per Tube Daily   Continuous Infusions: . sodium chloride Stopped (03/12/19 1210)    Active Problems:   Acute metabolic encephalopathy   Encounter for intubation   Catatonia   Acute respiratory failure with hypoxemia ([place])   COVID-19 virus detected   Pressure injury of skin   Acute blood loss anemia   Hemorrhagic shock (HCC)   FTT (failure to  thrive) in adult   LOS: 32 days   How to contact the [facility] Attending or Consulting provider [place] or covering provider during after hours [place], for this patient?  1. Check the care team in [facility] and look for a) attending/consulting TRH provider listed and b) the [facility] team listed 2. Log into [email or web address] and use 's universal password to access. If you do not have the password, please contact the hospital operator. 3. Locate the [facility] provider you are looking for under [facility] and page to a number that you can be directly reached. 4. If you still have difficulty reaching the provider, please page the [facility] (Director on Call) for the Hospitalists listed on amion for assistance.

## 2019-04-12 NOTE — Plan of Care (Signed)
  Problem: Clinical Measurements: Goal: Respiratory complications will improve Outcome: Progressing Goal: Cardiovascular complication will be avoided Outcome: Progressing   Problem: Pain Managment: Goal: General experience of comfort will improve Outcome: Progressing   Problem: Skin Integrity: Goal: Risk for impaired skin integrity will decrease Outcome: Progressing

## 2019-04-12 NOTE — Progress Notes (Signed)
Pt noted tachy 120`s notified on call C. Bodenheimer,NP via text page. Received new order: Metoprolol 5mg  IV x1.

## 2019-04-12 NOTE — Progress Notes (Signed)
LUA Midline d/c'd due to past 28 days and  Upper arm edematous.

## 2019-04-12 NOTE — TOC Progression Note (Signed)
Transition of Care [facility]) - Progression Note    Patient Details  Name: Rebecca Bruce MRN: [id number] Date of Birth: 1960/06/24  Transition of Care [facility]) CM/SW [place], [place] Phone Number: 04/12/2019, 2:19 PM  Clinical Narrative:    Aware of pt continued oral intake improving, await confirmation of pt ability to transfer independently. Group Home RN can be contacted with questions/to arrange representative to be here with therapy team if needed- Gena Fray [phone].    Expected Discharge Plan: Group Home Barriers to Discharge: Continued Medical Work up, Other (comment)(pt with [employer])  Expected Discharge Plan and Services Expected Discharge Plan: Group Home   Discharge Planning Services: CM Consult Post Acute Care Choice: [place] arrangements for the past 2 months: Group Home     Readmission Risk Interventions Readmission Risk Prevention Plan 04/09/2019  Transportation Screening Complete  PCP or Specialist Appt within 5-7 Days Not Complete  Not Complete comments pending medical stability  Home Care Screening Complete  Medication Review (RN CM) Referral to Pharmacy  Some recent data might be hidden

## 2019-04-13 LAB — GLUCOSE, CAPILLARY
Glucose-Capillary: 122 mg/dL — ABNORMAL HIGH (ref 70–99)
Glucose-Capillary: 114 mg/dL — ABNORMAL HIGH (ref 70–99)
Glucose-Capillary: 111 mg/dL — ABNORMAL HIGH (ref 70–99)
Glucose-Capillary: 115 mg/dL — ABNORMAL HIGH (ref 70–99)

## 2019-04-13 NOTE — Progress Notes (Signed)
Pt has not voided after In and out cath yesterday. Bladder scan noted 264ml. Text page on call C. Bodenheimer,NP. Received new order to insert foley.

## 2019-04-13 NOTE — Progress Notes (Signed)
PROGRESS NOTE  Rebecca Bruce [id number] DOB: 05-03-1960 DOA: 03/11/2019 PCP: Steele Sizer, MD  Hospital Summary  59 y/o female with history of hypothyroid, htn, intellectual debility [grp home resident for 20 yr] brought to [facility] on 11/28 in the setting of altered mental status from her group home. She was found to have COVID pneumonia, treated with decadron and remdesivir then sent to [facility] on 11/30 - progressive increase in acute encephalopathy and has become less and less responsive.  12/4 PCCM asked to see her because she reached the point that she was aspirating and not responding to pharyngeal/tracheal suctioning.  exam findings have waxed and waned over the last three days but have not generally moved in the right direction.  Neurology saw her on 12/2 and apparently at that time she followed some simple commands, but she was non-verbal at the time with significant rigidity noted in her upper extremities.   At baseline lives in a group home, can bathe herself and have a conversation. Has baseline anxiety and depression. 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   She is transferred from [place] long 12/18 due to finding of hemoperitoneum for emergent angiogram  Significant events: 11/28 Admission [facility] 11/30 Transfer to [facility] 2 12/04 Intubation for inability to protect airway 12/05 Diprivan, fentanyl gtt. Suctioned large plug by RT >>Peak pressure improved. 12/12 Tx to [place]. Sedation decreased but put back on fentanyl drip overnight for tachycardia. Failed SBT 12/13 PSV wean, mental status barrier to extubation 12/14: Extubated.  12/16: Pressor demands down after volume, stress steroids, and art line placement.  12/17: Hemoglobin acutely dropped from 11.6 down to 3.9, received 2 units of blood, CT abdomen pelvis showing large new hemoperitoneum involving the left upper quadrant of the abdomen near the  splenic flexure of the colon etiology was not clear. All anticoagulation was discontinued. 12/18: Received another unit of blood overnight, hemoglobin had gone from 7.4 down to 6.9, with no bump following transfusion. General surgery consulted. Interventional radiology also consulted. Tr to [employer]  [facility] [employer] [facility]    Acute encephalopathy unknown etiology, likely exacerbated COVID-19 with catatonia, history of mental retardation, improving.  - No evidence of acute CNS findings/vasculitis at this time per neurology.   - Suspect she is at baseline.  - Can return to group home when able to maintain nutrition and assist with transfers  Nutrition, adult failure to thrive, resolving  -PO tolerance greatly improved after removal of NG/core track - Once able to maintain hydration, nutrition can return to group home. - Continue management per dietitian including vitamin C, zinc, multivitamin, Magic cup, nocturnal feedings.  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   - Off pressors 12/18. --Hemoglobin last check 12/27 was stable - no longer following.  COVID-19 pneumonia, extubated 12/14, POA, Resolved.  Completed dexamethasone and remdesivir. Transferred to medical floor 12/26, off isolation, past 21-day infectious window  Ambulatory dysfunction/debility/weakness Likely 2/2 poor nutritional support Follow PT/OT  Can return to facility when able to assist with ADLs/Transfers safely  Severe multilevel cervical disc degeneration stenosis on MRI.  Neurosurgery evaluated. Recommending further outpatient follow-up.  DVT prophylaxis: SCDs Code Status: Full Family Communication: none present Disposition Plan: Return to group home once medically stable and able to tolerate PO adequately and assist w/ ambulation/transfers -likely next 24 to 48 hours.  [name], DO  [facility] Direct contact: see [email or web address] (further directions at bottom of note if needed) 7PM-7AM contact night coverage as at bottom of note 04/13/2019, 7:26 AM  LOS: 33 days   Interval History/Subjective  No acute issues or events overnight, patient more verbal this morning than previous, appears agitated but otherwise review systems markedly limited given patient's baseline mental status.  Objective   Vitals:  Vitals:   04/12/19 2127 04/13/19 0603  BP: 124/77 (!) 129/57  Pulse: (!) 109 (!) 109  Resp: 20 20  Temp: 98.1 F (36.7 C) 97.6 F (36.4 C)  SpO2: 96% 96%    Exam:  Constitutional.  Appears calm, comfortable. Cardiovascular.  Regular rate and rhythm.  No murmur, rub or gallop. Respiratory.  Clear to auscultation bilaterally.  No wheezes, rales or rhonchi. Abdomen.  Does not permit abdominal examination. Psychiatric.  Speech is inappropriate, poor comprehension of medical decision making.  I have personally reviewed the following:   Today's Data  . CBG remains stable.  Scheduled Meds: . chlorhexidine  15 mL Mouth Rinse BID  . Chlorhexidine Gluconate Cloth  6 each Topical Daily  . docusate  100 mg Per Tube Daily  . feeding supplement (ENSURE ENLIVE)  237 mL Oral TID BM  . guaiFENesin  5 mL Per Tube BID  . insulin aspart  0-15 Units Subcutaneous TID WC  . insulin aspart  0-5 Units Subcutaneous QHS  . levothyroxine  100 mcg Per Tube Q0600  . mouth rinse  15 mL Mouth Rinse q12n4p  . multivitamin with minerals  1 tablet Oral Daily  . sodium chloride flush  10-40 mL Intracatheter Q12H  . thiamine  100 mg Per Tube Daily  . vitamin C  500 mg Per Tube Daily  . zinc sulfate  220 mg Per Tube Daily   Continuous Infusions: . sodium chloride Stopped (03/12/19 1210)    Active Problems:   Acute metabolic encephalopathy   Encounter for intubation   Catatonia   Acute respiratory failure with hypoxemia ([place])   COVID-19 virus detected   Pressure injury of skin   Acute  blood loss anemia   Hemorrhagic shock (HCC)   FTT (failure to thrive) in adult   LOS: 33 days   How to contact the [facility] Attending or Consulting provider [place] or covering provider during after hours [place], for this patient?  1. Check the care team in [facility] and look for a) attending/consulting TRH provider listed and b) the [facility] team listed 2. Log into [email or web address] and use [place]'s universal password to access. If you do not have the password, please contact the hospital operator. 3. Locate the [facility] provider you are looking for under [facility] and page to a number that you can be directly reached. 4. If you still have difficulty reaching the provider, please page the [facility] (Director on Call) for the Hospitalists listed on amion for assistance.

## 2019-04-14 LAB — GLUCOSE, CAPILLARY
Glucose-Capillary: 112 mg/dL — ABNORMAL HIGH (ref 70–99)
Glucose-Capillary: 117 mg/dL — ABNORMAL HIGH (ref 70–99)
Glucose-Capillary: 108 mg/dL — ABNORMAL HIGH (ref 70–99)
Glucose-Capillary: 96 mg/dL (ref 70–99)

## 2019-04-14 NOTE — Progress Notes (Signed)
Physical Therapy Treatment Patient Details Name: Rebecca Bruce MRN: [id number] DOB: 04/05/1961 Today's Date: 04/14/2019    History of Present Illness Pt is a 59 y.o. female admitted from group home to [facility] on 03/09/19 with AMS, poor oral intake; (+) COVID-19. Pt with progressive increase in acute encephalopathy . 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 Elevated CSF protein is compatible with but not diagnostic of a resolving viral encephalitis.  onia. Brain MRI 12/3 without acute abnormality. ETT 12/4-12/14. 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  PMH includes intellectual disability, HTN.    PT Comments    Continuing work on functional mobility and activity tolerance;  Participation in therapies in an unfamiliar environment continues to be very difficult for [facility], and unfortunately if she is not participating (and especially refusing to the point of getting agitated), then  Transfers are not safe; Today, Rebecca Bruce did show progress, sitting EOB for longer and needing less assist; she took a few sips of chocolate ensure when handed her cup;  Invited and attempted sit to stand and pivot transfer training, with handheld, leading , and encouraging assist, however Rebecca Bruce ultimately pulled away and refused; Assisted her back to bed and put bed in chair position;   I agree with Michiel Cowboy, SW with [facility] -- perhaps we will get better participation when we coordinate with someone familiar to [facility] from her [place]; left a voicemail for Butch Penny at [phone] to try and schedule a time to see [facility] with them tomorrow (it being Sunday today, may not hear back to schedule today -- will plan also to call in the  morning);  As we have been following Rebecca Bruce for PT and OT during this episode of care, therapies have consistently been recommending SNF to maximize independence and safety with mobility to get her back to her Group Home; While this recommendation remains appropriate, I also recognize that getting back to her familiar environment, caregivers, and routine is likely the most therapeutic DC plan for Rebecca Bruce;   I noticed that [name] is a [facility] client with Medicare as primary insurance; Is the [facility] an option for Rebecca Bruce to have 24 hour rehab-focused assistance upon initial dc to her group Home?  It is worth considering if she doesn't show participation and progress while here acutely.    Follow Up Recommendations  SNF;Supervision/Assistance - 24 hour;Other (comment)(See discussion in PT comments: [place]?)     Equipment Recommendations  Wheelchair cushion (measurements PT);Wheelchair (measurements PT)    Recommendations for Other Services Other (comment)(Can [facility] rep weigh in on available assist at dc?)     Precautions / Restrictions Precautions Precautions: Fall Precaution Comments: can be combative/aggressive, MR at baseline    Mobility  Bed Mobility Overal bed mobility: Needs Assistance Bed Mobility: Supine to Sit;Sit to Supine     Supine to sit: Mod assist;+2 for physical assistance Sit to supine: Mod assist;+2 for physical assistance   General bed mobility comments: Tends to decline initially; At one point, indicated she may want ice in her ensure, so set ensure with ice on tray table beside bed to entice her up; Mod assist and used bed pad to support thighs to come off of bed; Rebecca Bruce did not actively Office manager  [place] as she came to sit  Transfers                 General transfer comment: Several invitations to stand and lots of encouragement, including hand held "leading" her forward, in hopes of getting a standing response; however, Rebecca Bruce  declined  Ambulation/Gait                 Stairs             Wheelchair Mobility    Modified Rankin (Stroke Patients Only)       Balance   Sitting-balance support: Bilateral upper extremity supported Sitting balance-Leahy Scale: Poor(approaching Fair) Sitting balance - Comments: Sat EOB approx 10 minutes, with mild posterior lean and propping herself with bil UEs placed slightly behind her; assist ranging from Heavy mod to minguard assist; occasionally with one hand propped when handed her ensure to drink                                    Cognition Arousal/Alertness: Awake/alert Behavior During Therapy: Flat affect Overall Cognitive Status: Impaired/Different from baseline Area of Impairment: Attention;Following commands;Awareness;Safety/judgement;Problem solving                   Current Attention Level: Sustained   Following Commands: Follows one step commands inconsistently Safety/Judgement: Decreased awareness of safety;Decreased awareness of deficits Awareness: Intellectual Problem Solving: Requires tactile cues;Requires verbal cues General Comments: Pt alert but became agitated quickly and continued to state "No!" to almost every question or sentence      Exercises      General Comments General comments (skin integrity, edema, etc.): She took a few sips of her ensure when teh cup is handed to her      Pertinent Vitals/Pain Pain Assessment: Faces Faces Pain Scale: Hurts a little bit Pain Location: LUE Pain Descriptors / Indicators: Grimacing;Guarding Pain Intervention(s): Monitored during session    Home Living                      Prior Function            PT Goals (current goals can now be found in the care plan section) Acute Rehab PT Goals Patient Stated Goal: Pt unable to state, but slightly motivated by chocolate ensure PT Goal Formulation: Patient unable to participate in goal setting Time For Goal  Achievement: 04/28/19([place] set 12/16 continue to be appropriate) Potential to Achieve Goals: Fair Progress towards PT goals: Progressing toward goals(Very slowly, with limited participation in unfamiliar settin)    Frequency    Min 2X/week      PT Plan Current plan remains appropriate    Co-evaluation              AM-PAC PT "6 Clicks" Mobility   Outcome Measure  Help needed turning from your back to your side while in a flat bed without using bedrails?: A Lot Help needed moving from lying on your back to sitting on the side of a flat bed without using bedrails?: A Lot Help needed moving to and from a bed to a chair (including a wheelchair)?: Total Help needed standing up from a chair using your arms (e.g., wheelchair or bedside chair)?: Total Help needed to walk in hospital room?: Total Help needed climbing 3-5 steps with a railing? : Total 6 Click Score: 8    End of Session  Activity Tolerance: Other (comment)(Decr participation) Patient left: in chair;with call bell/phone within reach;with bed alarm set;Other (comment)(Bad in near chair position) Nurse Communication: Mobility status PT Visit Diagnosis: Other symptoms and signs involving the nervous system [id number])     Time: 1001-1025 PT Time Calculation (min) (ACUTE ONLY): 24 min  Charges:  $Therapeutic Activity: 23-37 mins                     Roney Marion, PT  Acute Rehabilitation Services Pager [phone] Office [place] 04/14/2019, 12:03 PM

## 2019-04-14 NOTE — Progress Notes (Signed)
PROGRESS NOTE  Rebecca Bruce [id number] DOB: November 26, 1960 DOA: 03/11/2019 PCP: Steele Sizer, MD  Hospital Summary  59 y/o female with history of hypothyroid, htn, intellectual debility [grp home resident for 20 yr] brought to [facility] on 11/28 in the setting of altered mental status from her group home. She was found to have COVID pneumonia, treated with decadron and remdesivir then sent to [facility] on 11/30 - progressive increase in acute encephalopathy and has become less and less responsive.  12/4 PCCM asked to see her because she reached the point that she was aspirating and not responding to pharyngeal/tracheal suctioning.  exam findings have waxed and waned over the last three days but have not generally moved in the right direction.  Neurology saw her on 12/2 and apparently at that time she followed some simple commands, but she was non-verbal at the time with significant rigidity noted in her upper extremities.   At baseline lives in a group home, can bathe herself and have a conversation. Has baseline anxiety and depression. 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   She is transferred from [place] long 12/18 due to finding of hemoperitoneum for emergent angiogram  Significant events: 11/28 Admission [facility] 11/30 Transfer to [facility] 12/04 Intubation for inability to protect airway 12/05 Diprivan, fentanyl gtt. Suctioned large plug by RT >>Peak pressure improved. 12/12 Tx to [place]. Sedation decreased but put back on fentanyl drip overnight for tachycardia. Failed SBT 12/13 PSV wean, mental status barrier to extubation 12/14: Extubated.  12/16: Pressor demands down after volume, stress steroids, and art line placement.  12/17: Hemoglobin acutely dropped from 11.6 down to 3.9, received 2 units of blood, CT abdomen pelvis showing large new hemoperitoneum involving the left upper quadrant of the abdomen near the  splenic flexure of the colon etiology was not clear. All anticoagulation was discontinued. 12/18: Received another unit of blood overnight, hemoglobin had gone from 7.4 down to 6.9, with no bump following transfusion. General surgery consulted. Interventional radiology also consulted. Tr to [employer]  [facility]    Acute encephalopathy unknown etiology, likely exacerbated COVID-19 with catatonia, history of mental retardation, improving.  - No evidence of acute CNS findings/vasculitis at this time per neurology.   - Suspect she is at baseline.  - Can return to group home when able to maintain nutrition and assist with transfers -patient continues to be somewhat difficult with physical therapy, attempting to have patient's group home faculty the hospital to assist in further evaluate patient for safety for discharge  Nutrition, adult failure to thrive, resolved  -PO tolerance greatly improved after removal of NG/core track - Tolerating PO well - likely can return to group home pending PT. - Continue management per dietitian including vitamin C, zinc, multivitamin, Magic cup, nocturnal feedings.  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   - Off pressors 12/18. --Hemoglobin last check 12/27 was stable - no longer following.  COVID-19 pneumonia, extubated 12/14, POA, Resolved.  Completed dexamethasone and remdesivir. Transferred to medical floor 12/26, off isolation, past 21-day infectious window  Ambulatory dysfunction/debility/weakness Likely 2/2 poor nutritional support Follow PT/OT  Can return to facility when able to assist with ADLs/Transfers safely  Severe multilevel cervical disc degeneration stenosis on MRI.  Neurosurgery evaluated. Recommending further outpatient follow-up.  DVT prophylaxis: SCDs Code Status: Full Family Communication: none present  Disposition Plan:  Return to group home once medically stable and able to tolerate PO adequately and assist w/ ambulation/transfers -likely next 24 to 48 hours.  [name], DO  [facility] Direct contact: see [email or web address] (further directions at bottom of note if needed) 7PM-7AM contact night coverage as at bottom of note 04/14/2019, 1:43 PM  LOS: 34 days   Interval History/Subjective  No acute issues or events overnight, patient more verbal this morning than previous, appears agitated but otherwise review systems markedly limited given patient's baseline mental status.   Objective   Vitals:  Vitals:   04/13/19 2230 04/14/19 0636  BP: 126/74 123/71  Pulse: (!) 107 93  Resp: 19 18  Temp: 99.8 F (37.7 C) 98.7 F (37.1 C)  SpO2: 97% 98%    Exam:  Constitutional.  Appears calm, comfortable. Cardiovascular.  Regular rate and rhythm.  No murmur, rub or gallop. Respiratory.  Clear to auscultation bilaterally.  No wheezes, rales or rhonchi. Abdomen.  Does not permit abdominal examination. Psychiatric.  Speech is inappropriate, poor comprehension of medical decision making.  I have personally reviewed the following:   Today's Data  . CBG remains stable.  Scheduled Meds: . chlorhexidine  15 mL Mouth Rinse BID  . Chlorhexidine Gluconate Cloth  6 each Topical Daily  . docusate  100 mg Per Tube Daily  . feeding supplement (ENSURE ENLIVE)  237 mL Oral TID BM  . guaiFENesin  5 mL Per Tube BID  . insulin aspart  0-15 Units Subcutaneous TID WC  . insulin aspart  0-5 Units Subcutaneous QHS  . levothyroxine  100 mcg Per Tube Q0600  . mouth rinse  15 mL Mouth Rinse q12n4p  . multivitamin with minerals  1 tablet Oral Daily  . sodium chloride flush  10-40 mL Intracatheter Q12H  . thiamine  100 mg Per Tube Daily  . vitamin C  500 mg Per Tube Daily  . zinc sulfate  220 mg Per Tube Daily   Continuous Infusions: . sodium chloride Stopped (03/12/19 1210)    Active Problems:   Acute metabolic  encephalopathy   Encounter for intubation   Catatonia   Acute respiratory failure with hypoxemia ([place])   COVID-19 virus detected   Pressure injury of skin   Acute blood loss anemia   Hemorrhagic shock (HCC)   FTT (failure to thrive) in adult   LOS: 34 days   How to contact the [facility] Attending or Consulting provider [place] or covering provider during after hours [place], for this patient?  1. Check the care team in [facility] and look for a) attending/consulting TRH provider listed and b) the [facility] team listed 2. Log into [email or web address] and use [place]'s universal password to access. If you do not have the password, please contact the hospital operator. 3. Locate the [facility] provider you are looking for under [facility] and page to a number that you can be directly reached. 4. If you still have difficulty reaching the provider, please page the [facility] (Director on Call) for the Hospitalists listed on amion for assistance.

## 2019-04-15 LAB — GLUCOSE, CAPILLARY
Glucose-Capillary: 93 mg/dL (ref 70–99)
Glucose-Capillary: 93 mg/dL (ref 70–99)
Glucose-Capillary: 92 mg/dL (ref 70–99)
Glucose-Capillary: 168 mg/dL — ABNORMAL HIGH (ref 70–99)

## 2019-04-15 MED ORDER — LEVOTHYROXINE SODIUM 100 MCG PO TABS
100.0000 ug | ORAL_TABLET | Freq: Every day | ORAL | Status: DC
  Administered 2019-04-16 – 2019-04-21 (×6): 100 ug via ORAL
  Filled 2019-04-15 (×5): qty 1

## 2019-04-15 MED ORDER — GUAIFENESIN 100 MG/5ML PO SOLN
5.0000 mL | Freq: Two times a day (BID) | ORAL | Status: DC
  Administered 2019-04-16 – 2019-04-21 (×9): 100 mg via ORAL
  Filled 2019-04-15 (×13): qty 5

## 2019-04-15 MED ORDER — ASCORBIC ACID 500 MG PO TABS
500.0000 mg | ORAL_TABLET | Freq: Every day | ORAL | Status: DC
  Administered 2019-04-16 – 2019-04-21 (×6): 500 mg via ORAL
  Filled 2019-04-15 (×6): qty 1

## 2019-04-15 MED ORDER — ZINC SULFATE 220 (50 ZN) MG PO CAPS
220.0000 mg | ORAL_CAPSULE | Freq: Every day | ORAL | Status: DC
  Administered 2019-04-16 – 2019-04-21 (×6): 220 mg via ORAL
  Filled 2019-04-15 (×6): qty 1

## 2019-04-15 MED ORDER — THIAMINE HCL 100 MG PO TABS
100.0000 mg | ORAL_TABLET | Freq: Every day | ORAL | Status: DC
  Administered 2019-04-16 – 2019-04-21 (×6): 100 mg via ORAL
  Filled 2019-04-15 (×6): qty 1

## 2019-04-15 MED ORDER — ACETAMINOPHEN 325 MG PO TABS
650.0000 mg | ORAL_TABLET | Freq: Four times a day (QID) | ORAL | Status: DC | PRN
  Administered 2019-04-20 (×2): 650 mg via ORAL
  Filled 2019-04-15 (×2): qty 2

## 2019-04-15 MED ORDER — DOCUSATE SODIUM 100 MG PO CAPS
100.0000 mg | ORAL_CAPSULE | Freq: Every day | ORAL | Status: DC
  Administered 2019-04-15 – 2019-04-21 (×7): 100 mg via ORAL
  Filled 2019-04-15 (×7): qty 1

## 2019-04-15 NOTE — Progress Notes (Signed)
Physical Therapy note  Tried to contact Butch Penny again to coordinate therapies to have Felicie Morn, a caregiver familiar to [place], present;  Left a voicemail;   Roney Marion, [place] Pager [phone] Office [phone]

## 2019-04-15 NOTE — Progress Notes (Signed)
Nutrition Follow-up  DOCUMENTATION CODES:   Obesity unspecified  INTERVENTION:   -Continue 500 mg vitamin C BID -Continue 220 mg zinc sulfate daily -Continue MVI with minerals daily -Continue Magic cup TID with meals, each supplement provides 290 kcal and 9 grams of protein -Continue Ensure Enlive po TID, each supplement provides 350 kcal and 20 grams of protein -Hormel shake with meals, each supplement provides 520 kcals and 22 grams protein  NUTRITION DIAGNOSIS:   Increased nutrient needs related to acute illness(COVID PNA) as evidenced by estimated needs.  Ongoing  GOAL:   Patient will meet greater than or equal to 90% of their needs  Progressing   MONITOR:   PO intake, Supplement acceptance, Diet advancement, Labs, Weight trends, TF tolerance, Skin, I & O's  REASON FOR ASSESSMENT:   Consult Calorie Count  ASSESSMENT:   Pt with PMH of hypothyroidism, HTN, intellectual disability who lives in a group home. Pt admitted with AMS, poor oral intake, and COVID-19 PNA.  11/28 Admit  12/04 Intubated 12/14 Extubated 12/16 Cortrak placed-Gastric 12/17 CT abdomen with large new hemoperitoneum 12/18 Transferred to [facility] from [facility] 12/24- diet advanced to dysphagia 1 diet with nectar thick liquids  12/31- cortrak and TF d/c  Reviewed I/O's: +790 ml x 24 hours and -2.3 L since 04/01/19  UOP: 200 ml x 24 hours  Pt working with therapies at time of visit. Pt has been very reluctant to participate in therapies. Plan to have coordinate caregiver from group home to visit with therapies to help encourage pt.   Cortrak removed on 04/11/19 per MD orders. Intake remain poor, but with some improvement since last week (noted meal completion 25-50%). She had been consuming Ensure supplements, however, refused today's doses. Suspect pt's mental status and prolonged hospital stay (unfamiliar environment with unfamiliar caregivers) is hindering progress with PO's and participation in  therapies. Plan to d/c back to group home when medically stable.   04/13/19 Breakfast: 0% Lunch: 342 kcals, 18 grams protein Dinner: 137 kcals, 6 grams protein Supplements: 2 Ensure supplements (700 kcals, 40 grams protein)  Total intake: 1170 kcal (68% of minimum estimated needs)  64 grams protein (64% of minimum estimated needs)  04/14/19 Breakfast: 171 kcals, 7 grams protein Lunch: 219 kcals, 7 grams protein Dinner: 314 kcals, 14 grams Supplements: 1 Ensure supplement (350 kcals, 20 grams protein)  Total intake: 1054 kcal (61% of minimum estimated needs)  48 grams protein (48% of minimum estimated needs)  Average Total intake: 1112 kcal (64% of minimum estimated needs)  56 grams protein (56% of minimum estimated needs)  Labs reviewed: CBGS: 93-117 (inpatient orders for glycemic control are 0-15 units insulin aspart TID with meals and0-5 units insulin aspart q HS).   Diet Order:   Diet Order            DIET DYS 2 Room service appropriate? Yes; Fluid consistency: Thin  Diet effective now              EDUCATION NEEDS:   No education needs have been identified at this time  Skin:  Skin Assessment: Reviewed RN Assessment Skin Integrity Issues:: Stage II Stage II: L buttock Other: MASD- perineum, groin  Last BM:  04/14/19  Height:   Ht Readings from Last 1 Encounters:  03/12/19 5\' 2"  (1.575 m)    Weight:   Wt Readings from Last 1 Encounters:  04/13/19 81.2 kg    Ideal Body Weight:  50 kg  BMI:  Body mass index is  32.74 kg/m.  Estimated Nutritional Needs:   Kcal:  1725-2150  Protein:  100-125 gm  Fluid:  >/= 1.8 L    Unique Sillas A. Jimmye Norman, RD, LDN, [place] Registered Dietitian II Certified Diabetes Care and Education Specialist Pager: [phone] After hours Pager: [phone]

## 2019-04-15 NOTE — Progress Notes (Signed)
Physical Therapy Note   Contacted Gena Fray, RN, from [place];   Butch Penny will check on staff scheduling, and call me back to coordinate with Demetrias Tomasita Crumble (one of [name]'s caregivers) to join [place] for PT session;   Per Butch Penny -- it will be either today or tomorrow;   Please have Demetrius Tomasita Crumble named as [name]'s visitor and the [employer].  Thanks,   Roney Marion, [place]  Acute Rehabilitation Services Pager [phone] Office [phone]

## 2019-04-15 NOTE — Progress Notes (Signed)
PROGRESS NOTE  Rebecca Bruce [id number] DOB: Jul 21, 1960 DOA: 03/11/2019 PCP: Steele Sizer, MD  Hospital Summary  59 y/o female with history of hypothyroid, htn, intellectual debility [grp home resident for 20 yr] brought to [facility] on 11/28 in the setting of altered mental status from her group home. She was found to have COVID pneumonia, treated with decadron and remdesivir then sent to [facility] on 11/30 - progressive increase in acute encephalopathy and has become less and less responsive.  12/4 PCCM asked to see her because she reached the point that she was aspirating and not responding to pharyngeal/tracheal suctioning.  exam findings have waxed and waned over the last three days but have not generally moved in the right direction.  Neurology saw her on 12/2 and apparently at that time she followed some simple commands, but she was non-verbal at the time with significant rigidity noted in her upper extremities.   At baseline lives in a group home, can bathe herself and have a conversation. Has baseline anxiety and depression. 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 She is transferred from [facility] 12/18 due to finding of hemoperitoneum for emergent angiogram.  Significant events: 11/28 Admission [facility] 11/30 Transfer to [facility] for Covid-19 with hypoxia 12/04 Intubation for inability to protect airway 12/05 Diprivan, fentanyl gtt. Suctioned large mucus plug by RT >>Peak pressure improved. 12/12 Sedation decreased but put back on fentanyl drip overnight for tachycardia. Failed SBT 12/13 PSV wean, mental status barrier to extubation - failed SBT. 12/14: Extubated successfully.  12/16: Pressor demands down after volume, stress steroids, and art line placement.  12/17: Hemoglobin acutely dropped from 11.6 down to 3.9, received 2 units of blood, CT abdomen pelvis showing large new hemoperitoneum involving the left  upper quadrant of the abdomen near the splenic flexure of the colon etiology was not clear. All anticoagulation was discontinued. 12/18: Received another unit of blood overnight, hemoglobin had gone from 7.4 down to 6.9, with no bump following transfusion. General surgery consulted. Interventional radiology also consulted. Tr to [employer] 12/30: Bleeding stabilized with transfusion. Poor PO intake for prolonged time requiring NG tube. Mental status improving now back to baseline - difficulty feeding, evaluating for PT/OT, and clinically due to baseline mental status and anxiety with strangers/unknown persons. 12/31 NG removed successfully - tolerating PO adequately when reminded and guided to eat; PT still unable to evaluate given patient's anxiety/noncompliance - attempting to get group home faculty/staff to present to hospital to assist with evaluating and having patient participate with PT to ensure safe disposition.  A & P  Active Problems:   Acute metabolic encephalopathy   Encounter for intubation   Catatonia   Acute respiratory failure with hypoxemia ([place])   COVID-19 virus detected   Pressure injury of skin   Acute blood loss anemia   Hemorrhagic shock (HCC)   FTT (failure to thrive) in adult   Acute encephalopathy unknown etiology, likely exacerbated COVID-19 with catatonia, history of mental retardation, improving.  - No evidence of acute CNS findings/vasculitis at this time per neurology.   - Suspect she is at baseline.  - Can return to group home when able to maintain nutrition and assist with transfers -patient continues to be somewhat difficult with physical therapy, attempting to have patient's group home faculty the hospital to assist in further evaluate patient for safety for discharge  Nutrition, adult failure to thrive, resolved  -PO tolerance greatly  improved after removal of NG/core track - Tolerating PO well - likely can return to group home pending PT. - Continue  management per dietitian including vitamin C, zinc, multivitamin, Magic cup, nocturnal feedings.  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   - Off pressors 12/18. --Hemoglobin last check 12/27 was stable - no longer following.  COVID-19 pneumonia, extubated 12/14, POA, Resolved.  Completed dexamethasone and remdesivir. Transferred to medical floor 12/26, off isolation, past 21-day infectious window  Ambulatory dysfunction/debility/weakness Likely 2/2 poor nutritional support Follow PT/OT  Can return to facility when able to assist with ADLs/Transfers safely  Severe multilevel cervical disc degeneration stenosis on MRI.  Neurosurgery evaluated. Recommending further outpatient follow-up.  DVT prophylaxis: SCDs Code Status: Full Family Communication: none present Disposition Plan: Return to group home now - she remains medically stable and able to tolerate PO adequately. Will need to assist w/ ambulation/transfers per group home protocol.  [name], DO  [facility] Direct contact: see [email or web address] (further directions at bottom of note if needed) 7PM-7AM contact night coverage as at bottom of note 04/15/2019, 3:47 PM  LOS: 35 days   Interval History/Subjective  No acute issues or events overnight, patient more verbal this morning than previous, appears agitated but otherwise review systems markedly limited given patient's baseline mental status.   Objective   Vitals:  Vitals:   04/15/19 0424 04/15/19 1337  BP: 136/72 124/75  Pulse: 95 92  Resp: 18 18  Temp: 97.8 F (36.6 C) 98.7 F (37.1 C)  SpO2: 98% 97%    Exam:  Constitutional.  Appears calm, comfortable. Cardiovascular.  Regular rate and rhythm.  No murmur, rub or gallop. Respiratory.  Clear to auscultation bilaterally.  No wheezes, rales or rhonchi. Abdomen.  Does not permit abdominal  examination. Psychiatric.  Speech is inappropriate, poor comprehension of medical decision making.  I have personally reviewed the following:   Today's Data  . CBG remains stable.  Scheduled Meds: . [START ON 04/16/2019] vitamin C  500 mg Oral Daily  . chlorhexidine  15 mL Mouth Rinse BID  . Chlorhexidine Gluconate Cloth  6 each Topical Daily  . docusate sodium  100 mg Oral Daily  . feeding supplement (ENSURE ENLIVE)  237 mL Oral TID BM  . guaiFENesin  5 mL Oral BID  . insulin aspart  0-15 Units Subcutaneous TID WC  . insulin aspart  0-5 Units Subcutaneous QHS  . [START ON 04/16/2019] levothyroxine  100 mcg Oral Q0600  . mouth rinse  15 mL Mouth Rinse q12n4p  . multivitamin with minerals  1 tablet Oral Daily  . sodium chloride flush  10-40 mL Intracatheter Q12H  . [START ON 04/16/2019] thiamine  100 mg Oral Daily  . [START ON 04/16/2019] zinc sulfate  220 mg Oral Daily   Continuous Infusions: . sodium chloride Stopped (03/12/19 1210)    Active Problems:   Acute metabolic encephalopathy   Encounter for intubation   Catatonia   Acute respiratory failure with hypoxemia ([name])   COVID-19 virus detected   Pressure injury of skin   Acute blood loss anemia   Hemorrhagic shock (HCC)   FTT (failure to thrive) in adult   LOS: 35 days   How to contact the [facility] Attending or Consulting provider [place] or covering provider during after hours [place], for this patient?  1. Check the care team in [facility] and look for a)  attending/consulting TRH provider listed and b) the [facility] team listed 2. Log into [email or web address] and use [place]'s universal password to access. If you do not have the password, please contact the hospital operator. 3. Locate the [facility] provider you are looking for under [facility] and page to a number that you can be directly reached. 4. If you still have difficulty reaching the provider, please page the [facility] (Director on Call) for the Hospitalists listed on amion for  assistance.

## 2019-04-15 NOTE — Progress Notes (Signed)
  Speech Language Pathology Treatment: Dysphagia  Patient Details Name: Rebecca Bruce MRN: [id number] DOB: [birth year] Today's Date: 04/15/2019 Time: [id number] SLP Time Calculation (min) (ACUTE ONLY): 15 min  Assessment / Plan / Recommendation Clinical Impression  Pt was seen for skilled ST targeting dysphagia.  She was encountered awake/alert with lunch tray in front of her and dried secretions were observed on her labial surface.  She initially declined all po trials and oral care; however, she was agreeable to thin liquid trials given verbal encouragement.  Pt was observed with straw sips of thin liquid x8 and she exhibited a delayed cough x1; otherwise, no clinical s/sx of aspiration were observed.  AP transport was timely, but suspect delayed swallow initiation. Pt declined all solid trials despite verbal encouragement and different textures/flavors being offered.  SLP completed oral care following po trials and pt was observed to have a light, white coating on her lingual surface, concerning for thrush.  Recommend continuation of Dysphagia 2 solids and thin liquids with full supervision to assist with self feeding and to cue for compensatory strategies (listed below).  SLP will continue to f/u for dysphagia tx per POC.     HPI HPI: 59 y.o. female with PMHx of hypothyroidism, HTN, intellectual disability-lives in a group home for the past 20 years (normally fairly independent and talks)-brought in to [facility] on 11/28 for altered mental status, poor oral intake-further evaluation revealed COVID-19 pneumonia.  Encephalopathic, leading to clinical swallow evaluation 12/01 - noted that pt visually tracked people in room; did not initiate movement or recognize/manipulate POs.  Pt was intubated 12/4-14.  Dx include severe catatonic reaction of uncertain etiology, ARF with hypoxemia.      SLP Plan  Continue with current plan of care       Recommendations  Diet recommendations: Thin  liquid;Dysphagia 2 (fine chop) Liquids provided via: Cup Medication Administration: Crushed with puree Supervision: Full supervision/cueing for compensatory strategies Compensations: Slow rate;Small sips/bites;Minimize environmental distractions Postural Changes and/or Swallow Maneuvers: Seated upright 90 degrees                Oral Care Recommendations: Oral care BID Follow up Recommendations: (group home ) SLP Visit Diagnosis: Dysphagia, unspecified (R13.10) Plan: Continue with current plan of care       GO               Colin Mulders M.S., [place] Acute Rehabilitation Services Office: [phone]  [place] 04/15/2019, 12:35 PM

## 2019-04-15 NOTE — Plan of Care (Signed)
  Problem: Education: Goal: Knowledge of General Education information will improve Description: Including pain rating scale, medication(s)/side effects and non-pharmacologic comfort measures Outcome: Progressing   Problem: Health Behavior/Discharge Planning: Goal: Ability to manage health-related needs will improve Outcome: Progressing   Problem: Clinical Measurements: Goal: Ability to maintain clinical measurements within normal limits will improve Outcome: Progressing   Problem: Nutrition: Goal: Adequate nutrition will be maintained Outcome: Progressing   Problem: Elimination: Goal: Will not experience complications related to bowel motility Outcome: Progressing Goal: Will not experience complications related to urinary retention Outcome: Progressing   Problem: Safety: Goal: Ability to remain free from injury will improve Outcome: Progressing   Problem: Skin Integrity: Goal: Risk for impaired skin integrity will decrease Outcome: Progressing

## 2019-04-15 NOTE — Progress Notes (Signed)
Physical Therapy Note  Plan to see [facility] tomorrow, 1/5 at 10 am with Felicie Morn, a familiar caregiver from her [place].  Roney Marion, [place]  Acute Rehabilitation Services Pager [phone] Office [phone]

## 2019-04-16 LAB — GLUCOSE, CAPILLARY
Glucose-Capillary: 158 mg/dL — ABNORMAL HIGH (ref 70–99)
Glucose-Capillary: 102 mg/dL — ABNORMAL HIGH (ref 70–99)
Glucose-Capillary: 145 mg/dL — ABNORMAL HIGH (ref 70–99)
Glucose-Capillary: 107 mg/dL — ABNORMAL HIGH (ref 70–99)

## 2019-04-16 NOTE — TOC Progression Note (Signed)
Transition of Care [facility]) - Progression Note    Patient Details  Name: Rebecca Bruce MRN: [id number] Date of Birth: March 18, 1961  Transition of Care [facility]) CM/SW [place], [place] Phone Number: 04/16/2019, 10:48 AM  Clinical Narrative:    Continue to follow for progression so that pt may return to group home. Appreciate PT efforts to work with group home staff today.    Expected Discharge Plan: Group Home Barriers to Discharge: Continued Medical Work up, Other (comment)(pt with [employer])  Expected Discharge Plan and Services Expected Discharge Plan: Group Home   Discharge Planning Services: CM Consult Post Acute Care Choice: [place] arrangements for the past 2 months: Group Home                 Readmission Risk Interventions Readmission Risk Prevention Plan 04/09/2019  Transportation Screening Complete  PCP or Specialist Appt within 5-7 Days Not Complete  Not Complete comments pending medical stability  Home Care Screening Complete  Medication Review (RN CM) Referral to Pharmacy  Some recent data might be hidden

## 2019-04-16 NOTE — Plan of Care (Signed)
  Problem: Coping: Goal: Psychosocial and spiritual needs will be supported Outcome: Progressing   Problem: Respiratory: Goal: Will maintain a patent airway Outcome: Progressing Goal: Complications related to the disease process, condition or treatment will be avoided or minimized Outcome: Progressing   Problem: Clinical Measurements: Goal: Respiratory complications will improve Outcome: Progressing   Problem: Activity: Goal: Risk for activity intolerance will decrease Outcome: Progressing   Problem: Nutrition: Goal: Adequate nutrition will be maintained Outcome: Progressing   Problem: Elimination: Goal: Will not experience complications related to bowel motility Outcome: Progressing Goal: Will not experience complications related to urinary retention Outcome: Progressing   Problem: Pain Managment: Goal: General experience of comfort will improve Outcome: Progressing   Problem: Safety: Goal: Ability to remain free from injury will improve Outcome: Progressing

## 2019-04-16 NOTE — Plan of Care (Signed)
  Problem: Clinical Measurements: Goal: Ability to maintain clinical measurements within normal limits will improve Outcome: Progressing Goal: Will remain free from infection Outcome: Progressing Goal: Respiratory complications will improve Outcome: Progressing Goal: Cardiovascular complication will be avoided Outcome: Progressing   Problem: Elimination: Goal: Will not experience complications related to bowel motility Outcome: Progressing Goal: Will not experience complications related to urinary retention Outcome: Progressing   Problem: Pain Managment: Goal: General experience of comfort will improve Outcome: Progressing   Problem: Safety: Goal: Ability to remain free from injury will improve Outcome: Progressing   Problem: Skin Integrity: Goal: Risk for impaired skin integrity will decrease Outcome: Progressing

## 2019-04-16 NOTE — Progress Notes (Signed)
PROGRESS NOTE  Rebecca Bruce [id number] DOB: 1960/09/09 DOA: 03/11/2019 PCP: Steele Sizer, MD  Hospital Summary  59 y/o female with history of hypothyroid, htn, intellectual debility [grp home resident for 20 yr] brought to [facility] on 11/28 in the setting of altered mental status from her group home. She was found to have COVID pneumonia, treated with decadron and remdesivir then sent to [facility] on 11/30 - progressive increase in acute encephalopathy and has become less and less responsive.  12/4 PCCM asked to see her because she reached the point that she was aspirating and not responding to pharyngeal/tracheal suctioning.Neurology saw her on 12/2 and apparently at that time she followed some simple commands, but she was non-verbal at the time with significant rigidity noted in her upper extremities. At baseline lives in a group home, can bathe herself and have a conversation. Has baseline anxiety and depression. 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 She is transferred from [facility] 12/18 due to finding of hemoperitoneum for emergent angiogram.  Significant events: 11/28 Admission [facility] 11/30 Transfer to [facility] for Covid-19 with hypoxia 12/04 Intubation for inability to protect airway 12/05 Diprivan, fentanyl gtt. Suctioned large mucus plug by RT >>Peak pressure improved. 12/12 Sedation decreased but put back on fentanyl drip overnight for tachycardia. Failed SBT 12/13 PSV wean, mental status barrier to extubation - failed SBT. 12/14: Extubated successfully.  12/16: Pressor demands down after volume, stress steroids, and art line placement.  12/17: Hemoglobin acutely dropped from 11.6 down to 3.9, received 2 units of blood, CT abdomen pelvis showing large new hemoperitoneum involving the left upper quadrant of the abdomen near the splenic flexure of the colon etiology was not clear. All anticoagulation was  discontinued. 12/18: Received another unit of blood overnight, hemoglobin had gone from 7.4 down to 6.9, with no bump following transfusion. General surgery consulted. Interventional radiology also consulted. Tr to [employer] 12/30: Bleeding stabilized, anemia resolved with transfusion. Poor PO intake for prolonged time requiring NG tube. Mental status improving now essentially back to baseline - difficulty feeding, evaluating for PT/OT, and clinically due to baseline mental status and anxiety with strangers/unknown persons. 12/31 NG removed successfully - tolerating PO adequately when reminded and guided to eat; PT still unable to evaluate given patient's anxiety/noncompliance - attempting to get group home faculty/staff to present to hospital to assist with evaluating and having patient participate with PT to ensure safe disposition. 1/5: Group home staff able to visit, PT able to ambulate patient needing max assist, attempting to set up with your left and home health PT group home with social work.  Patient's p.o. status is improving, will liberalize patient's diet in hopes to increase her p.o. intake.  She still remains somewhat noncompliant with our hospital staff but much more reasonable with staff from group home that she knows well.  A & P  Active Problems:   Acute metabolic encephalopathy   Encounter for intubation   Catatonia   Acute respiratory failure with hypoxemia ([place])   COVID-19 virus detected   Pressure injury of skin   Acute blood loss anemia   Hemorrhagic shock (HCC)   FTT (failure to thrive) in adult   Acute encephalopathy unknown etiology, likely exacerbated COVID-19 with catatonia, history of mental retardation, improving. - No evidence of acute CNS findings/vasculitis at this time per neurology.   - Suspect she is at baseline.  - Can return to group home  when able to maintain nutrition and assist with transfers - patient continues to be noncompliant with hospital  staff.   Ambulatory dysfunction/debility/weakness Likely 2/2 poor nutritional support, improving Follow PT/OT - having difficulty with compliacne Group home staff present 04/16/19 - patient participated with PT requiring mod/max assist - will attempt to set up hoyer lift for group home to assist with transfers while patient continues PT. She is much more compliant with her known staff than our own.  Moderate malnutrition, adult failure to thrive, resolved  -PO tolerance greatly improved after removal of NG/core track - Tolerating PO well - likely can return to group home pending PT. - Continue management per dietitian including vitamin C, zinc, multivitamin, Magic cup, nocturnal feedings.  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   - Off pressors 12/18. --Hemoglobin stabilized - no longer following.  COVID-19 pneumonia, extubated 12/14, POA, Resolved.  Completed dexamethasone and remdesivir. 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  Neurosurgery evaluated. Recommending further outpatient follow-up.  DVT prophylaxis: SCDs Code Status: Full Family Communication: none present Disposition Plan: Return to group home once able to obtain hoyer lift and group home agreeable - she remains medically stable now tolerating PO adequately.     Interval History/Subjective  No acute issues or events overnight, patient in good spirits this morning with group home staff present. Able to take sips of juice, eat fruit and some pureed food without incident. Speech planning to liberalize diet in hopes this will encourage patient to eat better. ROS limited per patient.  Objective   Vitals:  Vitals:   04/15/19 2158 04/16/19 0540  BP: 108/67 (!) 146/103  Pulse: (!) 101 (!) 102  Resp: 18 16    Temp: 97.9 F (36.6 C) 97.6 F (36.4 C)  SpO2: 98% 93%    Exam:  Constitutional.  Appears calm, comfortable. Cardiovascular.  Regular rate and rhythm.  No murmur, rub or gallop. Respiratory.  Clear to auscultation bilaterally.  No wheezes, rales or rhonchi. Abdomen.  Does not permit abdominal examination. Psychiatric.  Speech is appropriate, poor comprehension of medical decision making. Following group home staff member commands without difficulty.  I have personally reviewed the following:   Today's Data  . CBG remains stable.  Scheduled Meds: . vitamin C  500 mg Oral Daily  . chlorhexidine  15 mL Mouth Rinse BID  . Chlorhexidine Gluconate Cloth  6 each Topical Daily  . docusate sodium  100 mg Oral Daily  . feeding supplement (ENSURE ENLIVE)  237 mL Oral TID BM  . guaiFENesin  5 mL Oral BID  . insulin aspart  0-15 Units Subcutaneous TID WC  . insulin aspart  0-5 Units Subcutaneous QHS  . levothyroxine  100 mcg Oral Q0600  . mouth rinse  15 mL Mouth Rinse q12n4p  . multivitamin with minerals  1 tablet Oral Daily  . sodium chloride flush  10-40 mL Intracatheter Q12H  . thiamine  100 mg Oral Daily  . zinc sulfate  220 mg Oral Daily   Continuous Infusions: . sodium chloride Stopped (03/12/19 1210)     LOS: 36 days    [name], DO  [facility] Direct contact: see [email or web address] 04/16/2019, 12:45 PM   LOS: 36 days

## 2019-04-16 NOTE — Progress Notes (Signed)
Physical Therapy Treatment Patient Details Name: Rebecca Bruce MRN: [id number] DOB: 01-Aug-1960 Today's Date: 04/16/2019    History of Present Illness Pt is a 59 y.o. female admitted from group home to [facility] on 03/09/19 with AMS, poor oral intake; (+) COVID-19. Pt with progressive increase in acute encephalopathy . 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 Elevated CSF protein is compatible with but not diagnostic of a resolving viral encephalitis.  onia. Brain MRI 12/3 without acute abnormality. ETT 12/4-12/14. 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  PMH includes intellectual disability, HTN.    PT Comments    Continuing work on functional mobility and activity tolerance;  Demetrius Tomasita Crumble (will refer to her as Rebecca Bruce), Caregiver form [place], Rebecca Bruce's group home, joined [place] for [employer]; While Rebecca Bruce was still quite hesitant to move, her rapport with Rebecca Bruce was clear; Transferred to the recliner with +2 Total assist, support at Bil shoulder girdles and gait belt; close guard of knees for stability; Made 2 attempts to stand from the recliner as well, with minimal LE muscle activation in response to weight bearing/wiehgt shifted over feet;   I continue to believe that going home to her familiar environment, caregivers, routine, and food will be best for Rebecca Bruce -- My hope is that at home she will participate much more, have more motivation to do the things she likes, and she will make better recovery; I also ask to maximize [facility] therapy services (HHPT/OT/ST)  Still, with performance today, we must consider a hoyer lift, and perhaps non-emergent ambulance, or medical Lucianne Lei transport home;   Plan is to work  with [employer] and [employer], and Facetime with Butch Penny, Therapist, sports for [employer].  Follow Up Recommendations  Home health PT;Supervision/Assistance - 24 hour     Equipment Recommendations  Wheelchair cushion (measurements PT);Wheelchair (measurements PT);Other (comment)(consider [employer] and ambulance transport home)    Recommendations for Other Services       Precautions / Restrictions Precautions Precautions: Fall Precaution Comments: can be combative/aggressive, MR at baseline    Mobility  Bed Mobility Overal bed mobility: Needs Assistance Bed Mobility: Supine to Sit     Supine to sit: Mod assist;+2 for physical assistance     General bed mobility comments: Tends to decline initially; Heavy mod assist and lots of encouragement to het up to EOB  Transfers Overall transfer level: Needs assistance Equipment used: 2 person hand held assist Transfers: Stand Pivot Transfers   Stand pivot transfers: +2 physical assistance;Total assist       General transfer comment: Heavy assist of 2 (including her familiar caregiver); Max assist at gait belt and close guard of knees; very weak and virtually minimal to no quad activation  Ambulation/Gait                 Stairs             Wheelchair Mobility    Modified Rankin (Stroke Patients Only)       Balance                                            Cognition Arousal/Alertness: Awake/alert Behavior  During Therapy: Restless Overall Cognitive Status: (Nearing baseline)                                 General Comments: Still saying "no!" a lot, but with Rebecca Bruce, her familiar caregiver, she participated a bit more      Exercises      General Comments General comments (skin integrity, edema, etc.): Rebecca Bruce, ST, and Dr. Avon Gully joined [place] for session with Demetrius from [facility] group Home      Pertinent Vitals/Pain Pain Assessment: Faces Faces Pain Scale: No hurt Pain Location:  LUE Pain Descriptors / Indicators: Grimacing;Guarding Pain Intervention(s): Monitored during session    Home Living                      Prior Function            PT Goals (current goals can now be found in the care plan section) Acute Rehab PT Goals Patient Stated Goal: At one point, Rebecca Bruce indicated she wants to go home PT Goal Formulation: Patient unable to participate in goal setting Time For Goal Achievement: 04/28/19 Potential to Achieve Goals: Fair Progress towards PT goals: Progressing toward goals    Frequency    Min 3X/week      PT Plan Discharge plan needs to be updated;Frequency needs to be updated    Co-evaluation PT/OT/SLP Co-Evaluation/Treatment: Yes([place] with ST) Reason for Co-Treatment: Necessary to address cognition/behavior during functional activity PT goals addressed during session: Mobility/safety with mobility   SLP goals addressed during session: Swallowing    AM-PAC PT "6 Clicks" Mobility   Outcome Measure  Help needed turning from your back to your side while in a flat bed without using bedrails?: A Little Help needed moving from lying on your back to sitting on the side of a flat bed without using bedrails?: A Lot Help needed moving to and from a bed to a chair (including a wheelchair)?: A Lot Help needed standing up from a chair using your arms (e.g., wheelchair or bedside chair)?: A Lot Help needed to walk in hospital room?: Total Help needed climbing 3-5 steps with a railing? : Total 6 Click Score: 11    End of Session Equipment Utilized During Treatment: Gait belt Activity Tolerance: Patient tolerated treatment well Patient left: in chair;with call bell/phone within reach;with chair alarm set;with family/visitor present Nurse Communication: Mobility status PT Visit Diagnosis: Other symptoms and signs involving the nervous system (R29.898)     Time: [id number] PT Time Calculation (min) (ACUTE ONLY): 36 min  Charges:   $Therapeutic Activity: 8-22 mins                     Roney Marion, PT  Acute Rehabilitation Services Pager [phone] Office [phone]    Col[place]ta [place] 04/16/2019, 2:47 PM

## 2019-04-16 NOTE — Progress Notes (Signed)
  Speech Language Pathology Treatment: Dysphagia  Patient Details Name: Rebecca Bruce MRN: [id number] DOB: 08/19/1960 Today's Date: 04/16/2019 Time: 1110-1205 SLP Time Calculation (min) (ACUTE ONLY): 55 min  Assessment / Plan / Recommendation Clinical Impression  Pt was seen for skilled ST targeting diet tolerance and dysphagia tx.  Pt was encountered awake/alert with caregiver from group home and PT in room. Pt was seated upright in a recliner chair for this session (close to 90 degrees), which is optimal positioning for swallowing.  MD was also present in the room for the majority of this tx session.  Pt continued to refuse most PO intake; however she was observed with minimal trials of thin liquid, finely chopped solids, and pureed solids.  Pt preferred to feed herself and she benefited from reduced distractions during PO intake.   She was observed to be impulsive during thin liquid trials, taking multiple large sips in a row.  Pt with an immediate, prolonged cough following serial sips of thin liquid via cup and straw sip.  No clinical s/sx of aspiration were observed with single sips of thin liquid.  Pt refused nectar-thick liquid trials.  Additionally, pt exhibited prolonged bolus formation and reduced lingual manipulation with finely chopped and pureed solids; however, swallow initiation was present and no clinical s/sx of aspiration was observed.  Spoke with MD regarding pt's current presentation related to PO intake.  Discussed downgrade to nectar-thick liquids; however due to pt's poor PO intake, suspect that thin liquids will be more appropriate at this time to encourage continued liquid consumption and to help prevent dehydration.  Additionally discussed trial upgrade to Dysphagia 3 (soft) to encourage increased consumption of solids (pt prefers finger foods).  MD was in agreement. Therefore, recommend diet upgrade to Dysphagia 3 (soft) solids and continuation of thin liquids with full  supervision to cue for the following compensatory strategies: 1) Small bites/sips 2) Slow rate of intake 3) Sit upright 90 degrees.  SLP will continue to f/u per POC.     HPI HPI: 59 y.o. female with PMHx of hypothyroidism, HTN, intellectual disability-lives in a group home for the past 20 years (normally fairly independent and talks)-brought in to [facility] on 11/28 for altered mental status, poor oral intake-further evaluation revealed COVID-19 pneumonia.  Encephalopathic, leading to clinical swallow evaluation 12/01 - noted that pt visually tracked people in room; did not initiate movement or recognize/manipulate POs.  Pt was intubated 12/4-14.  Dx include severe catatonic reaction of uncertain etiology, ARF with hypoxemia.      SLP Plan  Continue with current plan of care       Recommendations  Diet recommendations: Dysphagia 3 (mechanical soft);Thin liquid Liquids provided via: Cup Medication Administration: Crushed with puree Supervision: Full supervision/cueing for compensatory strategies Compensations: Slow rate;Small sips/bites;Minimize environmental distractions Postural Changes and/or Swallow Maneuvers: Seated upright 90 degrees                Oral Care Recommendations: Oral care BID;Staff/trained caregiver to provide oral care Follow up Recommendations: Other (comment)(Group home) SLP Visit Diagnosis: Dysphagia, unspecified (R13.10) Plan: Continue with current plan of care                     Colin Mulders M.S., [place] Office: [phone]  [place] 04/16/2019, 12:11 PM

## 2019-04-17 LAB — GLUCOSE, CAPILLARY
Glucose-Capillary: 107 mg/dL — ABNORMAL HIGH (ref 70–99)
Glucose-Capillary: 99 mg/dL (ref 70–99)
Glucose-Capillary: 98 mg/dL (ref 70–99)
Glucose-Capillary: 98 mg/dL (ref 70–99)

## 2019-04-17 MED ORDER — PRO-STAT SUGAR FREE PO LIQD
30.0000 mL | Freq: Two times a day (BID) | ORAL | Status: DC
  Administered 2019-04-17 – 2019-04-21 (×8): 30 mL via ORAL
  Filled 2019-04-17 (×8): qty 30

## 2019-04-17 NOTE — Progress Notes (Signed)
Rehab Admissions Coordinator Note:  Per PT recommendation, patient was screened by Michel Santee for appropriateness for an Inpatient Acute Rehab Consult.  At this time, we are recommending Inpatient Rehab consult. I will place an order so we can evaluate the patient.   Michel Santee 04/17/2019, 4:24 PM  I can be reached at [phone].

## 2019-04-17 NOTE — Progress Notes (Signed)
Nutrition Follow-up  RD working remotely.  DOCUMENTATION CODES:   Obesity unspecified  INTERVENTION:   -Continue 500 mg vitamin C BID -Continue 220 mg zinc sulfate daily -Continue MVI with minerals daily -Continue Magic cup TID with meals, each supplement provides 290 kcal and 9 grams of protein -Continue Ensure Enlive po TID, each supplement provides 350 kcal and 20 grams of protein -Hormel shake with meals, each supplement provides 520 kcals and 22 grams protein -30 ml Prostat BID, each supplement provides 100 kcals and 15 grams protein  NUTRITION DIAGNOSIS:   Increased nutrient needs related to acute illness(COVID PNA) as evidenced by estimated needs.  Ongoing  GOAL:   Patient will meet greater than or equal to 90% of their needs  Progressing   MONITOR:   PO intake, Supplement acceptance, Diet advancement, Labs, Weight trends, TF tolerance, Skin, I & O's  REASON FOR ASSESSMENT:   Consult Calorie Count  ASSESSMENT:   Pt with PMH of hypothyroidism, HTN, intellectual disability who lives in a group home. Pt admitted with AMS, poor oral intake, and COVID-19 PNA.  11/28 Admit  12/04 Intubated 12/14 Extubated 12/16 Cortrak placed-Gastric 12/17 CT abdomen with large new hemoperitoneum 12/18 Transferred to [facility] from [facility] 12/24- diet advanced to dysphagia 1 diet with nectar thick liquids  12/31- cortrak and TF d/c 1/5- s/p BSE- advanced to dysphagia 3 diet with thin liquids  Reviewed I/O's: +530 ml x 24 hours and 2-4 L since 04/03/19  Attempted to speak with pt via phone, however, no answer.   Pt remains with poor oral intake. She accepts Ensure supplements about 50% of the time they are offered. Per SLP notes, pt is very impulsive and easily distracted.   Pt able to progress more with eating and therapies, as a staff member from group home familiar with pt has been present for evaluations. Also hopeful that pt will continue to improve oral intake with  increased food selections due to diet advancement  Wt has been stable since admission.   Medications reviewed and include thiamine, zinc sulfate, and ascorbic acid.   Plan to d/c back to group home once medically stable.   Labs reviewed: CBGS: 99-145 (inpatient orders for glycemic control are 0-15 units insulin aspart TID with meals and 0-5 units insulin aspart q HS).   Diet Order:   Diet Order            DIET DYS 3 Room service appropriate? No; Fluid consistency: Thin  Diet effective now              EDUCATION NEEDS:   No education needs have been identified at this time  Skin:  Skin Assessment: Reviewed RN Assessment Skin Integrity Issues:: Stage II Stage II: L buttock Other: MASD- perineum, groin  Last BM:  04/15/18  Height:   Ht Readings from Last 1 Encounters:  03/12/19 5\' 2"  (1.575 m)    Weight:   Wt Readings from Last 1 Encounters:  04/16/19 83.4 kg    Ideal Body Weight:  50 kg  BMI:  Body mass index is 33.63 kg/m.  Estimated Nutritional Needs:   Kcal:  1725-2150  Protein:  100-125 gm  Fluid:  >/= 1.8 L    Kaylinn Dedic A. Jimmye Norman, RD, LDN, [place] Registered Dietitian II Certified Diabetes Care and Education Specialist Pager: [phone] After hours Pager: [phone]

## 2019-04-17 NOTE — Progress Notes (Signed)
Rebecca Bruce  PROGRESS NOTE    Taralynn Cobbins  [id number] DOB: 05-17-1960 DOA: 03/11/2019 PCP: Steele Sizer, MD   Brief Narrative:   59 y/o female with history of hypothyroid, htn, intellectual debility [grp home resident for 20 yr] brought to [facility] on 11/28 in the setting of altered mental status from her group home. She was found to have COVID pneumonia, treated with decadron and remdesivir then sent to [facility] on 11/30 - progressive increase in acute encephalopathy and has become less and less responsive.  12/4 PCCM asked to see her because she reached the point that she was aspirating and not responding to pharyngeal/tracheal suctioning.Neurology saw her on 12/2 and apparently at that time she followed some simple commands, but she was non-verbal at the time with significant rigidity noted in her upper extremities. At baseline lives in a group home, can bathe herself and have a conversation. Has baseline anxiety and depression. 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 She is transferred from [place] [facility] 12/18 due to finding of hemoperitoneum for emergent angiogram.  04/17/19: No acute events ON. Keeps saying "Barbaraann Rondo is a crazy fool."   Assessment & Plan:   Active Problems:   Acute metabolic encephalopathy   Encounter for intubation   Catatonia   Acute respiratory failure with hypoxemia ([place])   COVID-19 virus detected   Pressure injury of skin   Acute blood loss anemia   Hemorrhagic shock (HCC)   FTT (failure to thrive) in adult  Acute encephalopathy unknown etiology,likely exacerbated COVID-19 with catatonia history of mental retardation     - No evidence of acute CNS findings/vasculitis at this time per neurology.       - Suspect she is at baseline.      - Can return to group home when able to maintain nutrition and assist with transfers - patient continues to be noncompliant with hospital staff.     - to be  evaluated by CIR  Ambulatory dysfunction/debility/weakness     - Likely 2/2 poor nutritional support, improving     - PT rec CIR eval     - Group home staff present 04/16/19 - patient participated with PT requiring mod/max assist - will attempt to set up hoyer lift for group home to assist with transfers while patient continues PT. She is much more compliant with her known staff than our own.  Moderate malnutrition, adult failure to thrive, resolved      - PO tolerance greatly improved after removal of NG/core track     - Tolerating PO well - likely can return to group home pending PT.     - Continue management per dietitian including vitamin C, zinc, multivitamin, Magic cup, nocturnal feedings.     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       - Off pressors 12/18.     - Hemoglobin stabilized     - no further evidence of bleed  COVID-19 pneumonia, extubated 12/14, POA, Resolved.      - Completed dexamethasone and remdesivir.     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       - Neurosurgery evaluated. Recommending further outpatient follow-up.  CIR to eval. Hopefully d/c soon.  DVT prophylaxis: SCDs  Code Status: FULL Family Communication: None at bedside   Disposition Plan: TBD  ROS:  Denies CP, ab pain, N, V, dyspnea . Remainder 10-pt ROS is negative for all not previously mentioned.  Subjective: "No! Barbaraann Rondo is crazy."  Objective: Vitals:   04/17/19 0128 04/17/19 0550 04/17/19 0803 04/17/19 1426  BP: 111/79 121/82 117/84 119/88  Pulse: (!) 102 91 93 95  Resp: 17 18 20    Temp: 98.4 F (36.9 C) 98.2 F (36.8 C) 98.2 F (36.8 C) 99.8 F (37.7 C)  TempSrc: Oral Oral Axillary Oral  SpO2: 98% 100% 98% 100%  Weight:      Height:         Intake/Output Summary (Last 24 hours) at 04/17/2019 1630 Last data filed at 04/17/2019 1315 Gross per 24 hour  Intake 150 ml  Output 1 ml  Net 149 ml   Filed Weights   04/11/19 0406 04/13/19 0500 04/16/19 0540  Weight: 82.5 kg 81.2 kg 83.4 kg    Examination:  General: 59 y.o. female resting in bed in NAD Cardiovascular: RRR, +S1, S2, no m/g/r, equal pulses throughout Respiratory: CTABL, no w/r/r, normal WOB GI: BS+, NDNT, no masses noted, no organomegaly noted MSK: No e/c/c Neuro: Alert to name, follows commands Psyc: agitated but cooperative   Data Reviewed: I have personally reviewed following labs and imaging studies.  CBC: No results for input(s): WBC, NEUTROABS, HGB, HCT, MCV, PLT in the last 168 hours. Basic Metabolic Panel: No results for input(s): NA, K, CL, CO2, GLUCOSE, BUN, CREATININE, CALCIUM, MG, PHOS in the last 168 hours. GFR: Estimated Creatinine Clearance: 67.5 mL/min (by C-G formula based on SCr of 0.92 mg/dL). Liver Function Tests: No results for input(s): AST, ALT, ALKPHOS, BILITOT, PROT, ALBUMIN in the last 168 hours. No results for input(s): LIPASE, AMYLASE in the last 168 hours. No results for input(s): AMMONIA in the last 168 hours. Coagulation Profile: No results for input(s): INR, PROTIME in the last 168 hours. Cardiac Enzymes: No results for input(s): CKTOTAL, CKMB, CKMBINDEX, TROPONINI in the last 168 hours. BNP (last 3 results) No results for input(s): PROBNP in the last 8760 hours. HbA1C: No results for input(s): HGBA1C in the last 72 hours. CBG: Recent Labs  Lab 04/16/19 1210 04/16/19 1659 04/16/19 2230 04/17/19 0745 04/17/19 1139  GLUCAP 102* 145* 107* 107* 99   Lipid Profile: No results for input(s): CHOL, HDL, LDLCALC, TRIG, CHOLHDL, LDLDIRECT in the last 72 hours. Thyroid Function Tests: No results for input(s): TSH, T4TOTAL, FREET4, T3FREE, THYROIDAB in the last 72 hours. Anemia Panel: No results for input(s): VITAMINB12,  FOLATE, FERRITIN, TIBC, IRON, RETICCTPCT in the last 72 hours. Sepsis Labs: No results for input(s): PROCALCITON, LATICACIDVEN in the last 168 hours.  No results found for this or any previous visit (from the past 240 hour(s)).    Radiology Studies: No results found.   Scheduled Meds: . vitamin C  500 mg Oral Daily  . chlorhexidine  15 mL Mouth Rinse BID  . Chlorhexidine Gluconate Cloth  6 each Topical Daily  . docusate sodium  100 mg Oral Daily  . feeding supplement (ENSURE ENLIVE)  237 mL Oral TID BM  . feeding supplement (PRO-STAT SUGAR FREE 64)  30 mL Oral BID  . guaiFENesin  5 mL Oral BID  . insulin aspart  0-15 Units Subcutaneous TID WC  . insulin aspart  0-5 Units Subcutaneous QHS  . levothyroxine  100 mcg Oral Q0600  . mouth rinse  15 mL Mouth Rinse q12n4p  .  multivitamin with minerals  1 tablet Oral Daily  . sodium chloride flush  10-40 mL Intracatheter Q12H  . thiamine  100 mg Oral Daily  . zinc sulfate  220 mg Oral Daily   Continuous Infusions: . sodium chloride Stopped (03/12/19 1210)     LOS: 37 days    Time spent: 25 minutes spent in the coordination of care today.    Jonnie Finner, DO [facility]  If 7PM-7AM, please contact night-coverage [email or web address] 04/17/2019, 4:30 PM

## 2019-04-17 NOTE — Progress Notes (Signed)
SLP Cancellation Note  Patient Details Name: Shemariah Triner MRN: [id number] DOB: 12/17/1960   Cancelled treatment:       Reason Eval/Treat Not Completed: Unable to complete education with caregiver at this time, as caregiver is currently not present. Will continue efforts.  Carmela Rima, [place] Speech Language Pathologist Office: [phone] Pager: [phone]  Shonna Chock 04/17/2019, 3:43 PM

## 2019-04-17 NOTE — Consult Note (Signed)
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 Marylyn Ishihara    HPI: Rebecca Bruce is a 59 y.o. female with history of MR with depression/anxiety who was originally admitted from her group home on 03/09/19 with sepsis due to UTI and Covid PNA treated with dexamethasone and remdesivir. Since admission patient noted to be nonverbal with whole body stiffness and unresponsiveness. Neurology consulted for input and felt that patient paratonic with question of retarded catatonia but to continue antibacterial meningitic treatment . Per review of records--family reported that patient started regressing around age 58-13 and was placed in group home after her mother passed away. She was intubated 12/4-12/14 due to VDRD and hospital course significant for hypotension with ABLA 12/17 due to hemoperitoneum LUQ due to unkonwn etiology. She was transfused with 2 units PRBC, anticoagulation discontinued and conservative care recommended by CCS. 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   EEG with severe diffuse encephalopathy. LP with mildly elevated protein without oligoclonal bands and negative for anti-GAD antibodies. MRI brain 12/3 and repeat 12/21 due to persistent encephalopathy and did not show acute changes. Thiamine recommended due to possible malnutrition.   MRI C spine done due to neck stiffness and showed multilevel stenosis and Dr. Annette Stable questioned of myelopathy contributing to rigidity and flexor withdrawal but no surgical needs indicated at this time.  Dr. Leonel Ramsay felt that encephalopathy multifactorial in someone with "a poor prodrome" and prolonged immobility from intubation may play role in current weakness. He recommends  supportive care over weeks to months to monitor for recovery. Patient on dysphagia diet nectar liquids but has had poor po intake requiring cortak for nutritional support. Diet advanced to dysphagia 3, thins in hopes of improving intake. Therapy ongoing and patient resistant but shows improvement in participation with caregiver from group home. CIR recommended due to functional decline and to decrease burden of care.    Review of Systems  Unable to perform ROS: Language   Past Medical History:  Diagnosis Date  . GERD (gastroesophageal reflux disease)   . Hypertension   . Thyroid disease     Past Surgical History:  Procedure Laterality Date  . DENTAL SURGERY    . IR ANGIOGRAM SELECTIVE EACH ADDITIONAL VESSEL  03/29/2019  . IR ANGIOGRAM SELECTIVE EACH ADDITIONAL VESSEL  03/29/2019  . IR ANGIOGRAM SELECTIVE EACH ADDITIONAL VESSEL  03/29/2019  . IR ANGIOGRAM SELECTIVE EACH ADDITIONAL VESSEL  03/29/2019  . IR ANGIOGRAM VISCERAL SELECTIVE  03/29/2019  . IR ANGIOGRAM VISCERAL SELECTIVE  03/29/2019  . IR US GUIDE VASC ACCESS RIGHT  03/29/2019    Family History  Problem Relation Age of Onset  . Heart disease Mother   . Breast cancer Neg Hx     Social History:  Lives in a group home in [place].  Per  reports that she has never smoked. She has never used smokeless tobacco. Per reports that she does not drink alcohol or use drugs.    Allergies: No Known Allergies    Medications Prior to Admission  Medication Sig Dispense Refill  . ascorbic acid (VITAMIN C) 500 MG/5ML syrup Take 5 mLs (500 mg total) by mouth daily. 473 mL 12  . atorvastatin (LIPITOR) 20 MG tablet TAKE 1 TABLET BY MOUTH AT BEDTIME FOR CHOLESTEROL (Patient taking differently: Take 20  mg by mouth at bedtime. ) 30 tablet 5  . dexamethasone (DECADRON) 10 MG/ML injection Inject 0.6 mLs (6 mg total) into the vein daily. 1 mL 0  . gabapentin (NEURONTIN) 300 MG capsule Take 1 capsule (300 mg total) by mouth at bedtime. 30  capsule 2  . levothyroxine (SYNTHROID) 100 MCG tablet Take 1 tablet (100 mcg total) by mouth daily before breakfast. 30 tablet 2  . LORazepam (ATIVAN) 0.5 MG tablet Take 0.5 mg by mouth daily as needed for anxiety. Can take an extra pill 30 minutes after the first dose if needed    . meropenem 1 g in sodium chloride 0.9 % 100 mL Inject 1 g into the vein every 8 (eight) hours.    . mupirocin ointment (BACTROBAN) 2 % Apply 1 application topically 2 (two) times daily as needed (for itchy bumps).     Marland Kitchen PARoxetine (PAXIL) 30 MG tablet TAKE 1 TABLET BY MOUTH AT BEDTIME FOR DEPRESSION (Patient taking differently: Take 30 mg by mouth at bedtime. ) 30 tablet 5  . saccharomyces boulardii (FLORASTOR) 250 MG capsule Take 250 mg by mouth daily.    Marland Kitchen triamcinolone cream (KENALOG) 0.1 % Apply 1 application topically 2 (two) times daily. For two weeks and stop, if no improvement return for follow up (Patient taking differently: Apply 1 application topically 2 (two) times daily as needed (for itchy bumps). ) 45 g 0  . triamterene-hydrochlorothiazide (MAXZIDE-25) 37.5-25 MG tablet TAKE 1/2 TABLET BY MOUTH EVERY DAY (Patient taking differently: Take 0.5 tablets by mouth daily. ) 15 tablet 11  . vancomycin 1,750 mg in sodium chloride 0.9 % 500 mL Inject 1,750 mg into the vein daily.    Marland Kitchen VIACTIV CALCIUM PLUS D 650-12.5-40 MG-MCG CHEW CHEW AND SWALLOW ONE PIECE TWICE DAILY. (SUPPLEMENT) CARAMEL (Patient taking differently: Chew 1 Dose by mouth 2 (two) times daily. ) 60 tablet 12  . zinc sulfate 220 (50 Zn) MG capsule Take 1 capsule (220 mg total) by mouth daily.      Home: Home Living Family/patient expects to be discharged to:: Skilled nursing facility  Functional History: Prior Function Level of Independence: Independent Comments: Pt walked without an AD; able to bath and dress herself; pt loves to vacuum; sweep; clean adn dance; has a boyfriend named Vicente Serene Functional Status:  Mobility: Bed Mobility Overal bed  mobility: Needs Assistance Bed Mobility: Supine to Sit Rolling: +2 for physical assistance, +2 for safety/equipment, Total assist Supine to sit: Mod assist, +2 for physical assistance, Max assist Sit to supine: Mod assist, +2 for physical assistance General bed mobility comments: Tends to decline initially; Heavy mod assist and lots of encouragement to get up to EOB; Once up to EOB, Max assist needed due to heavy posterior lean Transfers Overall transfer level: Needs assistance Equipment used: 2 person hand held assist(and support at gait belt) Transfer via Lift Equipment: Marketing executive Transfers: Public house manager, Sit to/from Stand Sit to Stand: +2 physical assistance, Max assist(used Marketing executive) Stand pivot transfers: +2 physical assistance, Max assist Squat pivot transfers: +2 physical assistance, +2 safety/equipment, Max assist General transfer comment: +2 Max assist for squat pivot transfer to recliner with drop-arm down (to approximate tsfr to [facility]); then practiced standing from recliner with sara plus lift; Able to get her on her feet in [place], but initially with minimal LE muscle activation to stand; Once up, though, noted postivie effort to "stand up straight" in [place], with notable work at knee and hip extension,  though did not get fully upright; 2 bouts of standing with sara plus lift      ADL: ADL Overall ADL's : Needs assistance/impaired Eating/Feeding: NPO Grooming: Moderate assistance Grooming Details (indicate cue type and reason): With hand over hand assist, using L hand and cues x 2 Upper Body Bathing: Bed level, Total assistance Lower Body Bathing: Total assistance, Bed level Upper Body Dressing : Total assistance, Bed level Lower Body Dressing: Total assistance, Bed level Toilet Transfer: Total assistance(bed level) Toileting- Clothing Manipulation and Hygiene: Total assistance, Bed level Functional mobility during ADLs: (Not attempted this date. Pt unable to  follow commands) General ADL Comments: pt followed 1 step command 1/4 trials with multimodal cues and redirection  Cognition: Cognition Overall Cognitive Status: Impaired/Different from baseline(Nearing baseline) Orientation Level: Oriented to person Cognition Arousal/Alertness: Awake/alert Behavior During Therapy: Restless(but noting slightly better interactions) Overall Cognitive Status: Impaired/Different from baseline(Nearing baseline) Area of Impairment: Attention, Following commands, Awareness, Safety/judgement, Problem solving Current Attention Level: Sustained Following Commands: Follows one step commands inconsistently Safety/Judgement: Decreased awareness of safety, Decreased awareness of deficits Awareness: Intellectual Problem Solving: Requires tactile cues, Requires verbal cues General Comments: Still saying "no!" a lot, but with Karena Addison, her familiar caregiver, she participated a bit more; in addition, towards teh end of her session, she offered to help [place], [place]; [place] indicated she is often offering help to staff at home   Blood pressure 111/64, pulse 93, temperature 98.6 F (37 C), temperature source Oral, resp. rate 20, height 5\' 2"  (1.575 m), weight 83.4 kg, SpO2 95 %. Physical Exam  Nursing note and vitals reviewed. Constitutional: She appears well-developed and well-nourished.  Sitting up in chair eating ice cream. NAD.   Musculoskeletal:     Comments: Large resolving hematoma left biceps --non tender to touch. Right ankle discomfort with ROM.   Neurological: She is alert.  Able to state her name as "[name]" --asking to chair. Continues to say yes but more verbal this am. Tends to yell and says NO to most questions.  Restless--trying to get out of bed.  She did not want to participate in exam but did grip both hands--limited movement at shoulders due to resistance and ? discomfort.     Results for orders placed or performed during the hospital encounter of 03/11/19  (from the past 24 hour(s))  Glucose, capillary     Status: None   Collection Time: 04/17/19 11:39 AM  Result Value Ref Range   Glucose-Capillary 99 70 - 99 mg/dL  Glucose, capillary     Status: None   Collection Time: 04/17/19  4:32 PM  Result Value Ref Range   Glucose-Capillary 98 70 - 99 mg/dL  Glucose, capillary     Status: None   Collection Time: 04/17/19  9:47 PM  Result Value Ref Range   Glucose-Capillary 98 70 - 99 mg/dL  Glucose, capillary     Status: None   Collection Time: 04/18/19  7:46 AM  Result Value Ref Range   Glucose-Capillary 89 70 - 99 mg/dL   No results found.   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 Does the need for close, 24 hr/day medical supervision in concert with the patient's rehab needs make it unreasonable for this patient to be served in a less intensive setting? Yes and Potentially 2. Co-Morbidities requiring supervision/potential complications: cervical stenosis of unclear significance (less likely a source of weakness however, sepsis/UTI history 3. Due to bladder management, bowel management, safety, skin/wound care, disease management, medication  administration, pain management and patient education, does the patient require 24 hr/day rehab nursing? Potentially 4. Does the patient require coordinated care of a physician, rehab nurse, therapy disciplines of PT, SLP, OT to address physical and functional deficits in the context of the above medical diagnosis(es)? Yes and Potentially Addressing deficits in the following areas: balance, endurance, locomotion, strength, transferring, bowel/bladder control, bathing, dressing, feeding, grooming, toileting, cognition, speech, language, swallowing and psychosocial support 5. Can the patient actively participate in an intensive therapy program of at least 3 hrs of therapy per day at least 5 days per week? Potentially 6. The potential for patient to make measurable  gains while on inpatient rehab is fair 7. Anticipated functional outcomes upon discharge from inpatient rehab are  min assist+  with PT, supervision, min assist and mod assist with OT,   min assist+ with SLP. 8. Estimated rehab length of stay to reach the above functional goals is: ?20-30 days 9. Anticipated discharge destination: Home (Group home) ? 10. Overall Rehab/Functional Prognosis: fair  RECOMMENDATIONS: This patient's condition is appropriate for continued rehabilitative care in the following setting: see below Patient has agreed to participate in recommended program. N/A Note that insurance prior authorization may be required for reimbursement for recommended care.  Comment: Given pt's history of MR and premorbid needs, I anticipate that she will need substantial assistance at group home (at least min assistance ) even after a 3-4 week rehab stay. Can her home and caregivers provide this? Rehab Admissions Coordinator to follow up.  Thanks,  [name], MD, Mellody Drown  I have personally performed a face to face diagnostic evaluation of this patient. Additionally, I have examined pertinent labs and radiographic images. I have reviewed and concur with the physician assistant's documentation above.     Bary Leriche, PA-C 04/18/2019

## 2019-04-17 NOTE — Care Management Important Message (Signed)
Important Message  Patient Details  Name: Tasheanna Langsdorf MRN: [id number] Date of Birth: [birth year]   Medicare Important Message Given:  Yes     Shelda Altes 04/17/2019, 2:57 PM

## 2019-04-17 NOTE — Progress Notes (Signed)
Physical Therapy Treatment Patient Details Name: Rebecca Bruce MRN: [id number] DOB: 04-03-1961 Today's Date: 04/17/2019    History of Present Illness Pt is a 59 y.o. female admitted from group home to [facility] on 03/09/19 with AMS, poor oral intake; (+) COVID-19. Pt with progressive increase in acute encephalopathy . 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 Elevated CSF protein is compatible with but not diagnostic of a resolving viral encephalitis.  onia. Brain MRI 12/3 without acute abnormality. ETT 12/4-12/14. 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  PMH includes intellectual disability, HTN.    PT Comments    Continuing work on functional mobility and activity tolerance;  Notable improvements in activity tolerance and participation; Improved efforts at knee, hip, and trunk extension in standing with Vertis Kelch; With encouragement and guidance, Rebecca Bruce placed her LEs on the standing plate of the [employer]  without physical assist; [place], Building control surveyor from [place] was able to join [place] for the second half of the session;   Briefed Heather, RNCM with TOC on Rebecca Bruce's progress and recommendations/ideas for DC planning;   Rebecca Bruce has experienced quite a functional decline (was walking without an assistvie device, helping with house management, vacuuming, etc); she will have 24 hour assist at home; she is relatively young; she is tolerating time out of bed in the chair; she is participating better -- I believe a CIR stay is worth consideration to maximize independence and safety with mobility to be able to get back to [facility], her Group Home;   I spoke with Gena Fray, RN [phone]) re: [place] straight to [place]  (at [facility] level, needing +2 Max assist and possibly a hoyer lift); Butch Penny indicated that this is very possibly doable (but she will need to look into regulations re: ratio of residents at wheelchair level allowed); We also spoke about the longshot possibility of CIR, and I did request that a staff member be able to be present for therapy sessions; Butch Penny indicated that having a caregiver present for some (likely not all) therapy sessions is very likely doable;  Will ask Rehab AC to weigh-in on the possibility of CIR for rehab with the clear goal of getting back to [facility].  Follow Up Recommendations  Home health PT;Supervision/Assistance - 24 hour  Worth considering CIR      Equipment Recommendations  Wheelchair cushion (measurements PT);Wheelchair (measurements PT);Other (comment)(consider [employer] and ambulance transport home)    Recommendations for Other Services       Precautions / Restrictions Precautions Precautions: Fall Precaution Comments: MR at baseline    Mobility  Bed Mobility Overal bed mobility: Needs Assistance Bed Mobility: Supine to Sit     Supine to sit: Mod assist;+2 for physical assistance;Max assist     General bed mobility comments: Tends to decline initially; Heavy mod assist and lots of encouragement to get up to EOB; Once up to EOB, Max assist needed due to heavy posterior lean  Transfers Overall transfer level: Needs assistance Equipment used: 2 person hand held assist(and support at gait belt) Transfers: Set designer Transfers;Sit to/from Stand Sit to Stand: +2 physical assistance;Max assist(used Clarise Cruz Lift)   Squat pivot transfers: +2 physical assistance;+2 safety/equipment;Max assist     General transfer comment: +2 Max assist for squat pivot transfer  to recliner with drop-arm down (to approximate tsfr to [facility]); then practiced standing from recliner with sara plus lift; Able to get her on her feet in [place], but initially with  minimal LE muscle activation to stand; Once up, though, noted postivie effort to "stand up straight" in [place], with notable work at knee and hip extension, though did not get fully upright; 2 bouts of standing with sara plus lift  Ambulation/Gait                 Stairs             Wheelchair Mobility    Modified Rankin (Stroke Patients Only)       Balance     Sitting balance-Leahy Scale: (approaching Fair)                                      Cognition Arousal/Alertness: Awake/alert Behavior During Therapy: Restless(but noting slightly better interactions) Overall Cognitive Status: Impaired/Different from baseline(Nearing baseline)                                 General Comments: Still saying "no!" a lot, but with Karena Addison, her familiar caregiver, she participated a bit more; in addition, towards teh end of her session, she offered to help [place]; [place] indicated she is often offering help to staff at home      Exercises      General Comments General comments (skin integrity, edema, etc.): Noted improving participation as session progressed; she even indicated that riding in the sara Plus was fun      Pertinent Vitals/Pain Pain Assessment: Faces Faces Pain Scale: Hurts a little bit Pain Location: LUE; very sensistive when you assist at her LUE and hand Pain Descriptors / Indicators: Grimacing;Guarding Pain Intervention(s): Monitored during session    Home Living                      Prior Function            PT Goals (current goals can now be found in the care plan section) Acute Rehab PT Goals Patient Stated Goal: Agrees to getting OOB PT Goal Formulation: Patient unable to participate in goal setting Time For Goal Achievement: 04/28/19 Potential to Achieve Goals: Fair Progress towards PT goals: Progressing toward goals    Frequency    Min 3X/week      PT Plan Current plan remains appropriate     Co-evaluation              AM-PAC PT "6 Clicks" Mobility   Outcome Measure  Help needed turning from your back to your side while in a flat bed without using bedrails?: A Little Help needed moving from lying on your back to sitting on the side of a flat bed without using bedrails?: A Lot Help needed moving to and from a bed to a chair (including a wheelchair)?: A Lot Help needed standing up from a chair using your arms (e.g., wheelchair or bedside chair)?: A Lot Help needed to walk in hospital room?: Total Help needed climbing 3-5 steps with a railing? : Total 6 Click Score: 11    End of Session Equipment Utilized During Treatment: Gait belt Activity Tolerance: Patient tolerated treatment well Patient left: in chair;with call bell/phone within reach;with chair alarm set;with  family/visitor present(asked Dee to notify Nursing when she leaves) Nurse Communication: Mobility status PT Visit Diagnosis: Other symptoms and signs involving the nervous system (R29.898)     Time: 1330-1405 PT Time Calculation (min) (ACUTE ONLY): 35 min  Charges:  $Therapeutic Activity: 23-37 mins                     Roney Marion, PT  Acute Rehabilitation Services Pager [phone] Office [place] 04/17/2019, 3:25 PM

## 2019-04-18 LAB — GLUCOSE, CAPILLARY
Glucose-Capillary: 89 mg/dL (ref 70–99)
Glucose-Capillary: 117 mg/dL — ABNORMAL HIGH (ref 70–99)
Glucose-Capillary: 142 mg/dL — ABNORMAL HIGH (ref 70–99)
Glucose-Capillary: 106 mg/dL — ABNORMAL HIGH (ref 70–99)

## 2019-04-18 MED ORDER — LORAZEPAM 2 MG/ML IJ SOLN
1.0000 mg | Freq: Once | INTRAMUSCULAR | Status: AC
  Administered 2019-04-18: 1 mg via INTRAMUSCULAR
  Filled 2019-04-18: qty 1

## 2019-04-18 NOTE — Progress Notes (Signed)
Inpatient Rehab Admissions:  Inpatient Rehab Consult received.  I met with patient at the bedside for rehabilitation assessment and to discuss goals and expectations of an inpatient rehab admission.  I also spoke to her sister [name]) and the administrator for pt's group home Butch Penny) on the phone and it seems that pt will have adequate support if she can get to at least 1 person assist for transfers.  Will follow for possible admission pending bed availability.   Signed: Shann Medal, PT, DPT Admissions Coordinator [phone] 04/18/19  3:21 PM

## 2019-04-18 NOTE — Progress Notes (Signed)
  Speech Language Pathology Treatment: Dysphagia  Patient Details Name: Rebecca Bruce MRN: [id number] DOB: [birth year] Today's Date: 04/18/2019 Time: [id number] SLP Time Calculation (min) (ACUTE ONLY): 13 min  Assessment / Plan / Recommendation Clinical Impression  Pt was seen for skilled ST targeting diet tolerance and diagnostic treatment.  Pt was encountered lethargic in bed; however, she participated fairly well in today's tx session despite fatigue.  RN reported continued poor PO intake, even with upgrade to Dysphagia 3 (soft) solids.  Pt consumed minimal trials of thin liquid and puree, but she refused soft solid trials from her lunch meal tray.  Pt was noted to be distracted during PO intake and she was talking while puree bolus was in her oral cavity.  She exhibited an immediate cough following 1/2 puree trials.  No clinical s/sx of aspiration were observed with minimal trials of thin liquid.  Pt benefited from decreased distractions and verbal cues to sustain attention to the bolus trials.  Per MD recommendations on 04/15/18, will continue Dysphagia 3 (soft) solids and thin liquids at this time with the following compensatory strategies 1) Sit upright as possible (chair preferred) 2) Limit distraction 3) Small bites/sips.  Would recommend a downgrade to Dysphagia 1 (puree) solids if pt is observed to cough/choke during consumption of Dysphagia 3 solids.  SLP will continue to f/u per POC.     HPI HPI: 59 y.o. female with PMHx of hypothyroidism, HTN, intellectual disability-lives in a group home for the past 20 years (normally fairly independent and talks)-brought in to [facility] on 11/28 for altered mental status, poor oral intake-further evaluation revealed COVID-19 pneumonia.  Encephalopathic, leading to clinical swallow evaluation 12/01 - noted that pt visually tracked people in room; did not initiate movement or recognize/manipulate POs.  Pt was intubated 12/4-14.  Dx include severe catatonic  reaction of uncertain etiology, ARF with hypoxemia.      SLP Plan  Continue with current plan of care       Recommendations  Diet recommendations: Thin liquid;Dysphagia 3 (mechanical soft) Liquids provided via: Cup Medication Administration: Crushed with puree Supervision: Full supervision/cueing for compensatory strategies Compensations: Slow rate;Small sips/bites;Minimize environmental distractions Postural Changes and/or Swallow Maneuvers: Seated upright 90 degrees                Oral Care Recommendations: Oral care BID;Staff/trained caregiver to provide oral care Follow up Recommendations: Other (comment)(Group home) SLP Visit Diagnosis: Dysphagia, unspecified (R13.10) Plan: Continue with current plan of care                     Colin Mulders M.S., [place] Acute Rehabilitation Services Office: [phone]  [place] 04/18/2019, 1:16 PM

## 2019-04-18 NOTE — Progress Notes (Signed)
[facility]  PROGRESS NOTE    Rebecca Bruce  [id number] DOB: [birth year] DOA: 03/11/2019 PCP: Steele Sizer, MD   Brief Narrative:   59 y/o female with history of hypothyroid, htn, intellectual debility [grp home resident for 20 yr] brought to [facility] on 11/28 in the setting of altered mental status from her group home. She was found to have COVID pneumonia, treated with decadron and remdesivir then sent to [facility] on 11/30 - progressive increase in acute encephalopathy and has become less and less responsive.  12/4 PCCM asked to see her because she reached the point that she was aspirating and not responding to pharyngeal/tracheal suctioning.Neurology saw her on 12/2 and apparently at that time she followed some simple commands, but she was non-verbal at the time with significant rigidity noted in her upper extremities. At baseline lives in a group home, can bathe herself and have a conversation. Has baseline anxiety and depression. 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 She is transferred from [facility] long 12/18 due to finding of hemoperitoneum for emergent angiogram.  04/18/19: Eating breakfast this AM w/o complaint. No acute events.    Assessment & Plan:   Active Problems:   Acute metabolic encephalopathy   Encounter for intubation   Catatonia   Acute respiratory failure with hypoxemia ([place])   COVID-19 virus detected   Pressure injury of skin   Acute blood loss anemia   Hemorrhagic shock (HCC)   FTT (failure to thrive) in adult  Acute encephalopathy unknown etiology,likely exacerbated COVID-19 with catatonia history of mental retardation     - No evidence of acute CNS findings/vasculitis at this time per neurology.       - Suspect she is at baseline.      - Can return to group home when able to maintain nutrition and assist with transfers - patient continues to be noncompliant with hospital staff.     - to be  evaluated by CIR  Ambulatory dysfunction/debility/weakness     - Likely 2/2 poor nutritional support, improving     - PT rec CIR eval     - Group home staff present 04/16/19 - patient participated with PT requiring mod/max assist - will attempt to set up hoyer lift for group home to assist with transfers while patient continues PT. She is much more compliant with her known staff than our own.  Moderate malnutrition, adult failure to thrive, resolved      - PO tolerance greatly improved after removal of NG/core track     - Tolerating PO well - likely can return to group home pending PT.     - Continue management per dietitian including vitamin C, zinc, multivitamin, Magic cup, nocturnal feedings.     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       - Off pressors 12/18.     - Hemoglobin stabilized     - no further evidence of bleed  COVID-19 pneumonia, extubated 12/14, POA, Resolved.      - Completed dexamethasone and remdesivir.     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       - Neurosurgery evaluated. Recommending further outpatient follow-up.  DVT prophylaxis: SCDs Code Status: FULL Family Communication: None at bedside  Disposition Plan: TBD  ROS:  Denies dyspnea, N, V, F, D, CP . Remainder 10-pt ROS is negative for all not previously mentioned.  Subjective: "I want ice cream."  Objective: Vitals:   04/17/19 1426 04/17/19 2145 04/18/19 0446 04/18/19 1452  BP: 119/88 113/78 111/64 115/78  Pulse: 95 97 93 97  Resp:  18 20 18   Temp: 99.8 F (37.7 C) 98.6 F (37 C)  97.8 F (36.6 C)  TempSrc: Oral Oral  Oral  SpO2: 100% 99% 95% 98%  Weight:      Height:        Intake/Output Summary (Last 24 hours) at 04/18/2019 1456 Last data filed  at 04/18/2019 1347 Gross per 24 hour  Intake 640 ml  Output 900 ml  Net -260 ml   Filed Weights   04/11/19 0406 04/13/19 0500 04/16/19 0540  Weight: 82.5 kg 81.2 kg 83.4 kg    Examination:  General: 59 y.o. female resting in bed in NAD Cardiovascular: RRR, +S1, S2, no m/g/r Respiratory: CTABL, no w/r/r, normal WOB GI: BS+, NDNT, no masses noted MSK: No e/c/c Neuro: Alert to name, follows commands Psyc: calm/cooperative   Data Reviewed: I have personally reviewed following labs and imaging studies.  CBC: No results for input(s): WBC, NEUTROABS, HGB, HCT, MCV, PLT in the last 168 hours. Basic Metabolic Panel: No results for input(s): NA, K, CL, CO2, GLUCOSE, BUN, CREATININE, CALCIUM, MG, PHOS in the last 168 hours. GFR: Estimated Creatinine Clearance: 67.5 mL/min (by C-G formula based on SCr of 0.92 mg/dL). Liver Function Tests: No results for input(s): AST, ALT, ALKPHOS, BILITOT, PROT, ALBUMIN in the last 168 hours. No results for input(s): LIPASE, AMYLASE in the last 168 hours. No results for input(s): AMMONIA in the last 168 hours. Coagulation Profile: No results for input(s): INR, PROTIME in the last 168 hours. Cardiac Enzymes: No results for input(s): CKTOTAL, CKMB, CKMBINDEX, TROPONINI in the last 168 hours. BNP (last 3 results) No results for input(s): PROBNP in the last 8760 hours. HbA1C: No results for input(s): HGBA1C in the last 72 hours. CBG: Recent Labs  Lab 04/17/19 1139 04/17/19 1632 04/17/19 2147 04/18/19 0746 04/18/19 1243  GLUCAP 99 98 98 89 117*   Lipid Profile: No results for input(s): CHOL, HDL, LDLCALC, TRIG, CHOLHDL, LDLDIRECT in the last 72 hours. Thyroid Function Tests: No results for input(s): TSH, T4TOTAL, FREET4, T3FREE, THYROIDAB in the last 72 hours. Anemia Panel: No results for input(s): VITAMINB12, FOLATE, FERRITIN, TIBC, IRON, RETICCTPCT in the last 72 hours. Sepsis Labs: No results for input(s): PROCALCITON, LATICACIDVEN in the  last 168 hours.  No results found for this or any previous visit (from the past 240 hour(s)).    Radiology Studies: No results found.   Scheduled Meds: . vitamin C  500 mg Oral Daily  . chlorhexidine  15 mL Mouth Rinse BID  . Chlorhexidine Gluconate Cloth  6 each Topical Daily  . docusate sodium  100 mg Oral Daily  . feeding supplement (ENSURE ENLIVE)  237 mL Oral TID BM  . feeding supplement (PRO-STAT SUGAR FREE 64)  30 mL Oral BID  . guaiFENesin  5 mL Oral BID  . insulin aspart  0-15 Units Subcutaneous TID WC  . insulin aspart  0-5 Units Subcutaneous QHS  . levothyroxine  100 mcg Oral Q0600  . mouth rinse  15 mL Mouth Rinse q12n4p  . multivitamin with minerals  1 tablet Oral Daily  . sodium chloride flush  10-40 mL Intracatheter Q12H  .  thiamine  100 mg Oral Daily  . zinc sulfate  220 mg Oral Daily   Continuous Infusions: . sodium chloride Stopped (03/12/19 1210)     LOS: 38 days    Time spent: 25 minutes spent in the coordination of care today.    Jonnie Finner, DO [facility]  If 7PM-7AM, please contact night-coverage [email or web address] 04/18/2019, 2:56 PM

## 2019-04-19 LAB — GLUCOSE, CAPILLARY
Glucose-Capillary: 100 mg/dL — ABNORMAL HIGH (ref 70–99)
Glucose-Capillary: 106 mg/dL — ABNORMAL HIGH (ref 70–99)
Glucose-Capillary: 142 mg/dL — ABNORMAL HIGH (ref 70–99)
Glucose-Capillary: 136 mg/dL — ABNORMAL HIGH (ref 70–99)

## 2019-04-19 MED ORDER — LORAZEPAM 1 MG PO TABS
1.0000 mg | ORAL_TABLET | Freq: Once | ORAL | Status: AC
  Administered 2019-04-19: 1 mg via ORAL
  Filled 2019-04-19: qty 2

## 2019-04-19 MED ORDER — LORAZEPAM 0.5 MG PO TABS
0.5000 mg | ORAL_TABLET | Freq: Once | ORAL | Status: AC
  Administered 2019-04-19: 0.5 mg via ORAL
  Filled 2019-04-19: qty 1

## 2019-04-19 MED ORDER — LORAZEPAM 0.5 MG PO TABS
0.5000 mg | ORAL_TABLET | Freq: Every day | ORAL | Status: DC
  Administered 2019-04-19 – 2019-04-20 (×2): 0.5 mg via ORAL
  Filled 2019-04-19 (×2): qty 1

## 2019-04-19 NOTE — Progress Notes (Signed)
Physical Therapy Treatment Patient Details Name: Rebecca Bruce MRN: [id number] DOB: [birth year] Today's Date: 04/19/2019    History of Present Illness Pt is a 59 y.o. female admitted from group home to [facility] on 03/09/19 with AMS, poor oral intake; (+) COVID-19. Pt with progressive increase in acute encephalopathy . 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 Elevated CSF protein is compatible with but not diagnostic of a resolving viral encephalitis.  onia. Brain MRI 12/3 without acute abnormality. ETT 12/4-12/14. 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  PMH includes intellectual disability, HTN.    PT Comments    Unfortunately pt's caregiver unavailable for session today; therefore, very difficult to motivate pt to participate in session. She was able to achieve upright sitting at EOB with mod-max A x2 and mod A to return to supine.  At this time feel that pt would greatly benefit from further intensive therapy services prior to returning to her group home to decrease burden of care. PT will continue to follow pt acutely to progress mobility as tolerated per PT POC.    Follow Up Recommendations  CIR;Other (comment)(HHPT and 24/7 A if denied)     Equipment Recommendations  Wheelchair cushion (measurements PT);Wheelchair (measurements PT);Other (comment)(Hoyer lift and ambulance transfer)    Recommendations for Other Services       Precautions / Restrictions Precautions Precautions: Fall Precaution Comments: intellectual disability at baseline Restrictions Weight Bearing Restrictions: No    Mobility  Bed Mobility Overal bed mobility: Needs Assistance Bed Mobility: Supine to Sit;Sit to Supine     Supine to sit: Mod assist;Max assist;+2 for physical assistance Sit to supine: Mod assist   General bed mobility comments: initially declining and saying "no!" frequently. attempted to find a motivator for pt to achieve sitting position (caregiver unable to be present during session today). eventually agreeable to sit EOB and required mod A for bilateral LEs and max A for trunk management  Transfers                 General transfer comment: not attempted as pt lying trunk back in bed and not wanting to sit up anymore or attempt to stand  Ambulation/Gait                 Stairs             Wheelchair Mobility    Modified Rankin (Stroke Patients Only)       Balance Overall balance assessment: Needs assistance Sitting-balance support: Feet supported;Bilateral upper extremity supported Sitting balance-Leahy Scale: Poor                                      Cognition Arousal/Alertness: Awake/alert Behavior During Therapy: Flat affect;Restless;Agitated Overall Cognitive Status: Impaired/Different from baseline Area of Impairment: Following commands;Problem solving                       Following Commands: Follows one step commands inconsistently     Problem Solving: Decreased initiation;Requires verbal cues;Requires tactile cues        Exercises      General Comments        Pertinent  Vitals/Pain Pain Assessment: Faces Faces Pain Scale: No hurt    Home Living   Living Arrangements: Group Home   Type of Home: Group Home              Prior Function            PT Goals (current goals can now be found in the care plan section) Acute Rehab PT Goals PT Goal Formulation: Patient unable to participate in goal setting Time For Goal Achievement: 04/28/19 Potential to Achieve Goals: Fair Progress towards PT goals: Progressing toward goals    Frequency    Min 3X/week      PT Plan Current plan remains  appropriate    Co-evaluation              AM-PAC PT "6 Clicks" Mobility   Outcome Measure  Help needed turning from your back to your side while in a flat bed without using bedrails?: A Little Help needed moving from lying on your back to sitting on the side of a flat bed without using bedrails?: A Lot Help needed moving to and from a bed to a chair (including a wheelchair)?: A Lot Help needed standing up from a chair using your arms (e.g., wheelchair or bedside chair)?: A Lot Help needed to walk in hospital room?: Total Help needed climbing 3-5 steps with a railing? : Total 6 Click Score: 11    End of Session   Activity Tolerance: Patient tolerated treatment well Patient left: in bed;with call bell/phone within reach;with bed alarm set Nurse Communication: Mobility status PT Visit Diagnosis: Other symptoms and signs involving the nervous system [id number])     Time: [id number] PT Time Calculation (min) (ACUTE ONLY): 15 min  Charges:  $Therapeutic Activity: 8-22 mins                     Anastasio Champion, DPT  Acute Rehabilitation Services Pager [phone] Office [place] 04/19/2019, 3:13 PM

## 2019-04-19 NOTE — Consult Note (Signed)
[facility] CM Inpatient Consult   04/19/2019  Rebecca Bruce 10-Nov-1960 [id number]  Follow up: Disposition update  Chart review reveals patient is being considered for admission to Comprehensive Inpatient Rehab [CIR] at [facility] for potentially Sunday per Rehab Admission Coordinator's notes today.  No current [facility] Customer [facility] manager Care Management needs at this current review.  Charlesetta Shanks, RN BSN CCM [name]  [phone] business mobile phone Toll free office [phone]  Fax number: [phone] [email or web address]

## 2019-04-19 NOTE — Progress Notes (Signed)
Marland Kitchen  PROGRESS NOTE    Rebecca Bruce  [id number] DOB: 01/02/1961 DOA: 03/11/2019 PCP: Steele Sizer, MD   Brief Narrative:   59 y/o female with history of hypothyroid, htn, intellectual debility [grp home resident for 20 yr] brought to [facility] on 11/28 in the setting of altered mental status from her group home. She was found to have COVID pneumonia, treated with decadron and remdesivir then sent to [facility] on 11/30 - progressive increase in acute encephalopathy and has become less and less responsive.  12/4 PCCM asked to see her because she reached the point that she was aspirating and not responding to pharyngeal/tracheal suctioning.Neurology saw her on 12/2 and apparently at that time she followed some simple commands, but she was non-verbal at the time with significant rigidity noted in her upper extremities. At baseline lives in a group home, can bathe herself and have a conversation. Has baseline anxiety and depression. 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 She is transferred from [facility]/18 due to finding of hemoperitoneum for emergent angiogram.  04/19/19: Lying in bed this AM. Denies complaints. Says she doesn't want help this AM.    Assessment & Plan:   Active Problems:   Acute metabolic encephalopathy   Encounter for intubation   Catatonia   Acute respiratory failure with hypoxemia ([place])   COVID-19 virus detected   Pressure injury of skin   Acute blood loss anemia   Hemorrhagic shock (HCC)   FTT (failure to thrive) in adult  Acute encephalopathy unknown etiology,likely exacerbated COVID-19 with catatonia history of mental retardation - No evidence of acute CNS findings/vasculitis at this time per neurology.  - Suspect she is at baseline.  - Can return to group home when able to maintain nutrition and assist with transfers - patient continues to be noncompliant with hospital  staff. - 04/19/19: to CIR on Sunday; doing ok today  Ambulatory dysfunction/debility/weakness - Likely 2/2 poor nutritional support, improving - PT rec CIR eval - Group home staff present 04/16/19 - patient participated with PT requiring mod/max assist - will attempt to set up hoyer lift for group home to assist with transfers while patient continues PT. She is much more compliant with her known staff than our own.     - 04/19/19: To CIR on sunday  Moderate malnutrition, adult failure to thrive, resolved  - PO tolerance greatly improved after removal of NG/core track - Tolerating PO well - likely can return to group home pending PT. - Continue management per dietitian including vitamin C, zinc, multivitamin, Magic cup, nocturnal feedings. 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  - Off pressors 12/18. - Hemoglobin stabilized - no further evidence of bleed  COVID-19 pneumonia, extubated 12/14, POA, Resolved.  - Completed dexamethasone and remdesivir. 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  - Neurosurgery evaluated. Recommending further outpatient follow-up.  DVT prophylaxis: SCDs Code Status: FULL Family Communication: None at bedside   Disposition Plan: TBD  ROS:  Denies CP, dyspnea . Remainder 10-pt ROS is negative for all not previously mentioned.  Subjective: "No. I don't. No!"  Objective: Vitals:   04/18/19 1452 04/18/19 2126 04/18/19 2340 04/19/19 0655  BP: 115/78 135/87  129/85  Pulse: 97 99 94 (!)  104  Resp: 18 18  16   Temp: 97.8 F (36.6 C) 98.6 F (37 C)  98.4 F (36.9 C)  TempSrc: Oral Oral  Oral  SpO2: 98%  98% 95%  Weight:      Height:        Intake/Output  Summary (Last 24 hours) at 04/19/2019 1345 Last data filed at 04/18/2019 1603 Gross per 24 hour  Intake 420 ml  Output 600 ml  Net -180 ml   Filed Weights   04/11/19 0406 04/13/19 0500 04/16/19 0540  Weight: 82.5 kg 81.2 kg 83.4 kg    Examination:  General: 59 y.o. female resting in bed in NAD Eyes: PERRL, normal sclera Cardiovascular: RRR, +S1, S2, no m/g/r Respiratory: CTABL, no w/r/r GI: BS+, NDNT, soft MSK: No e/c/c Neuro: Alert and following commands Psyc: calm/cooperative   Data Reviewed: I have personally reviewed following labs and imaging studies.  CBC: No results for input(s): WBC, NEUTROABS, HGB, HCT, MCV, PLT in the last 168 hours. Basic Metabolic Panel: No results for input(s): NA, K, CL, CO2, GLUCOSE, BUN, CREATININE, CALCIUM, MG, PHOS in the last 168 hours. GFR: Estimated Creatinine Clearance: 67.5 mL/min (by C-G formula based on SCr of 0.92 mg/dL). Liver Function Tests: No results for input(s): AST, ALT, ALKPHOS, BILITOT, PROT, ALBUMIN in the last 168 hours. No results for input(s): LIPASE, AMYLASE in the last 168 hours. No results for input(s): AMMONIA in the last 168 hours. Coagulation Profile: No results for input(s): INR, PROTIME in the last 168 hours. Cardiac Enzymes: No results for input(s): CKTOTAL, CKMB, CKMBINDEX, TROPONINI in the last 168 hours. BNP (last 3 results) No results for input(s): PROBNP in the last 8760 hours. HbA1C: No results for input(s): HGBA1C in the last 72 hours. CBG: Recent Labs  Lab 04/18/19 1243 04/18/19 1606 04/18/19 2328 04/19/19 0856 04/19/19 1224  GLUCAP 117* 142* 106* 100* 106*   Lipid Profile: No results for input(s): CHOL, HDL, LDLCALC, TRIG, CHOLHDL, LDLDIRECT in the last 72 hours. Thyroid Function Tests: No results for input(s): TSH, T4TOTAL, FREET4, T3FREE, THYROIDAB in the last 72 hours. Anemia Panel: No results for input(s): VITAMINB12, FOLATE, FERRITIN, TIBC, IRON, RETICCTPCT in the last 72  hours. Sepsis Labs: No results for input(s): PROCALCITON, LATICACIDVEN in the last 168 hours.  No results found for this or any previous visit (from the past 240 hour(s)).    Radiology Studies: No results found.   Scheduled Meds: . vitamin C  500 mg Oral Daily  . chlorhexidine  15 mL Mouth Rinse BID  . Chlorhexidine Gluconate Cloth  6 each Topical Daily  . docusate sodium  100 mg Oral Daily  . feeding supplement (ENSURE ENLIVE)  237 mL Oral TID BM  . feeding supplement (PRO-STAT SUGAR FREE 64)  30 mL Oral BID  . guaiFENesin  5 mL Oral BID  . insulin aspart  0-15 Units Subcutaneous TID WC  . insulin aspart  0-5 Units Subcutaneous QHS  . levothyroxine  100 mcg Oral Q0600  . mouth rinse  15 mL Mouth Rinse q12n4p  . multivitamin with minerals  1 tablet Oral Daily  . sodium chloride flush  10-40 mL Intracatheter Q12H  . thiamine  100 mg Oral Daily  . zinc sulfate  220 mg Oral Daily   Continuous Infusions: . sodium chloride Stopped (03/12/19 1210)     LOS: 39 days    Time spent: 25 minutes spent in the coordination of care today.    Jonnie Finner, DO [facility]  If 7PM-7AM, please contact night-coverage [email or web address] 04/19/2019, 1:45 PM

## 2019-04-19 NOTE — PMR Pre-admission (Signed)
PMR Admission Coordinator Pre-Admission Assessment  Patient: Rebecca Bruce is an 59 y.o., female MRN: [id number] DOB: [birth year] Height: 5\' 2"  (157.5 cm) Weight: 83.4 kg              Insurance Information HMO:     PPO:      PCP:      IPA:      80/20:      OTHER:  PRIMARY: Medicare A and B      Policy#: [id number]      Subscriber: pt CM Name:       Phone#:      Fax#:  Pre-Cert#: verified eligibility online      Employer:  Benefits:  Phone #:      Name:  Eff. Date: A and B 09/10/95     Deduct: $1484      Out of Pocket Max: n/a      Life Max: n/a CIR: 100%      SNF: 20 full days Outpatient: 80%     Co-Pay: 20% Home Health: 100%      Co-Pay:  DME: 80%     Co-Pay: 20% Providers:  SECONDARY: Medicaid [place] Access (coverage code SAD)    Policy#: [id number]      Subscriber: pt CM Name:       Phone#:      Fax#:  Pre-Cert#:       Employer:  Benefits:  Phone #:      Name:  Eff. Date:      Deduct:       Out of Pocket Max:       Life Max:  CIR:       SNF:  Outpatient:      Co-Pay:  Home Health:       Co-Pay:  DME:      Co-Pay:   Medicaid Application Date:       Case Manager:  Disability Application Date:       Case Worker:   The "Data Collection Information Summary" for patients in Inpatient Rehabilitation Facilities with attached "Privacy Act [place] Records" was provided and verbally reviewed with: Family  Emergency Contact Information Contact Information    Name Relation Home Work [place], [name] Sister [phone]     Iesha, Altherr Brother   [place]  [phone]     Ezequiel Kayser Sister   [phone]   Group home, Kendrick Fries   [phone]     Current Medical History  Patient Admitting Diagnosis: debility 2/2 Covid-19   History of Present Illness: Rebecca Bruce is a 59 y.o. female with history of MR with depression/anxiety who was originally admitted from her group home on 03/09/19 with sepsis due to UTI  and Covid PNA, treated with dexamethasone and remdesivir. Since admission patient noted to be nonverbal with whole body stiffness and unresponsiveness. Neurology consulted for input and felt that patient paratonic with question of retarded catatonia but to continue antibacterial meningitic treatment . Per review of records--family reported that patient started regressing around age 4-13 and was placed in group home after her mother passed away. She was intubated 12/4-12/14 due to respiratory failure and hospital course significant for hypotension with ABLA 12/17 due to hemoperitoneum LUQ due to unkonwn etiology. She was transfused with 2 units PRBC, anticoagulation discontinued and conservative care recommended by CCS. 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  EEG with severe diffuse encephalopathy. LP with mildly elevated protein without oligoclonal bands and negative for anti-GAD antibodies. MRI brain 12/3 and repeat 12/21 due to persistent encephalopathy and did not show acute changes. Thiamine recommended due to possible malnutrition.   MRI C spine done due to neck stiffness and showed multilevel stenosis and Dr. Annette Stable questioned of myelopathy contributing to rigidity and flexor withdrawal but no surgical needs indicated at this time.  Dr. Leonel Ramsay felt that encephalopathy multifactorial in someone with "a poor prodrome" and prolonged immobility from intubation may play role in current weakness. He recommends supportive care over weeks to months to monitor for recovery. Patient on dysphagia 3 diet, thin liquids and tolerating.  Therapy ongoing and patient resistant but shows improvement in participation with caregiver from group home. CIR recommended due to functional decline and to decrease burden of care.    Past Medical History   Past Medical History:  Diagnosis Date  . GERD (gastroesophageal reflux disease)   . Hypertension   . Thyroid disease     Family History  family history includes Heart disease in her mother.  Prior Rehab/Hospitalizations:  Has the patient had prior rehab or hospitalizations prior to admission? No  Has the patient had major surgery during 100 days prior to admission? No  Current Medications   Current Facility-Administered Medications:  .  0.9 %  sodium chloride infusion, , Intravenous, PRN, Rigoberto Noel, MD, Stopped at 03/12/19 1210 .  acetaminophen (TYLENOL) tablet 650 mg, 650 mg, Oral, Q6H PRN, Skeet Simmer, RPH .  ascorbic acid (VITAMIN C) tablet 500 mg, 500 mg, Oral, Daily, Skeet Simmer, RPH, 500 mg at 04/19/19 1053 .  chlorhexidine (PERIDEX) 0.12 % solution 15 mL, 15 mL, Mouth Rinse, BID, Harold Hedge, MD, 15 mL at 04/19/19 1054 .  Chlorhexidine Gluconate Cloth 2 % PADS 6 each, 6 each, Topical, Daily, Rigoberto Noel, MD, 6 each at 04/18/19 [phone] .  docusate sodium (COLACE) capsule 100 mg, 100 mg, Oral, Daily, Skeet Simmer, RPH, 100 mg at 04/19/19 1053 .  feeding supplement (ENSURE ENLIVE) (ENSURE ENLIVE) liquid 237 mL, 237 mL, Oral, TID BM, Little Ishikawa, MD, 237 mL at 04/19/19 1053 .  feeding supplement (PRO-STAT SUGAR FREE 64) liquid 30 mL, 30 mL, Oral, BID, Kyle, Tyrone A, DO, 30 mL at 04/19/19 1053 .  guaiFENesin (ROBITUSSIN) 100 MG/5ML solution 100 mg, 5 mL, Oral, BID, Skeet Simmer, RPH, 100 mg at 04/19/19 1054 .  hydrALAZINE (APRESOLINE) injection 5 mg, 5 mg, Intravenous, Q6H PRN, Harold Hedge, MD, 5 mg at 04/05/19 1818 .  insulin aspart (novoLOG) injection 0-15 Units, 0-15 Units, Subcutaneous, TID WC, Bodenheimer, Charles A, NP, 2 Units at 04/18/19 1633 .  insulin aspart (novoLOG) injection 0-5 Units, 0-5 Units, Subcutaneous, QHS, Bodenheimer, Charles A, NP .  Ipratropium-Albuterol (COMBIVENT) respimat 1 puff, 1 puff, Inhalation, Q6H PRN, Samuella Cota, MD .  levothyroxine (SYNTHROID) tablet 100 mcg, 100 mcg, Oral, Q0600, Skeet Simmer, RPH, 100 mcg at 04/19/19 [id number] .  MEDLINE mouth rinse, 15 mL, Mouth Rinse, q12n4p, Harold Hedge, MD, 15 mL at 04/18/19 1634 .  multivitamin with minerals tablet 1 tablet, 1 tablet, Oral, Daily, Little Ishikawa, MD, 1 tablet at 04/19/19 1053 .  Resource ThickenUp Clear, , Oral, PRN, Harold Hedge, MD, Given at 04/11/19 1700 .  sodium chloride flush (NS) 0.9 % injection 10-40 mL, 10-40 mL, Intracatheter, Q12H,  Rigoberto Noel, MD, 10 mL at 04/18/19 0835 .  sodium chloride flush (NS) 0.9 % injection 10-40 mL, 10-40 mL, Intracatheter, PRN, Rigoberto Noel, MD, 10 mL at 04/05/19 0412 .  thiamine tablet 100 mg, 100 mg, Oral, Daily, Skeet Simmer, RPH, 100 mg at 04/19/19 1054 .  white petrolatum (VASELINE) gel, , Topical, PRN, Rigoberto Noel, MD .  zinc sulfate capsule 220 mg, 220 mg, Oral, Daily, Skeet Simmer, RPH, 220 mg at 04/19/19 1054  Patients Current Diet:  Diet Order            DIET DYS 3 Room service appropriate? No; Fluid consistency: Thin  Diet effective now              Precautions / Restrictions Precautions Precautions: Fall Precaution Comments: MR at baseline Restrictions Weight Bearing Restrictions: No   Has the patient had 2 or more falls or a fall with injury in the past year?No  Prior Activity Level Limited Community (1-2x/wk): lived in a group home, was independent w/o device, helping with chores, dancing, etc.  Prior Functional Level Prior Function Level of Independence: Independent Comments: Pt walked without an AD; able to bath and dress herself; pt loves to vacuum; sweep; clean adn dance; has a boyfriend named [place]: Did the patient need help bathing, dressing, using the toilet or eating?  Independent  Indoor Mobility: Did the patient need assistance with walking from room to room (with or without device)? Independent  Stairs: Did the  patient need assistance with internal or external stairs (with or without device)? Independent  Functional Cognition: Did the patient need help planning regular tasks such as shopping or remembering to take medications? Needed some help  Home Assistive Devices / [place] Devices/Equipment: None  Prior Device Use: Indicate devices/aids used by the patient prior to current illness, exacerbation or injury? None of the above  Current Functional Level Cognition  Overall Cognitive Status: Impaired/Different from baseline(Nearing baseline) Current Attention Level: Sustained Orientation Level: Oriented to person, Oriented to situation, Disoriented to place, Disoriented to time Following Commands: Follows one step commands inconsistently Safety/Judgement: Decreased awareness of safety, Decreased awareness of deficits General Comments: Still saying "no!" a lot, but with Karena Addison, her familiar caregiver, she participated a bit more; in addition, towards teh end of her session, she offered to help [place], [place]; Espy indicated she is often offering help to staff at home    Extremity Assessment (includes Sensation/Coordination)  Upper Extremity Assessment: Generalized weakness, RUE deficits/detail, LUE deficits/detail RUE Deficits / Details: No AROM. Resistance and/or tone noted during shld PROM. Elbow, wrist, and digit PROM WFL. RUE Coordination: decreased fine motor, decreased gross motor LUE Deficits / Details: No AROM. Resistance and/or tone noted during shld PROM. Elbow, wrist, and digit PROM WFL LUE Coordination: decreased fine motor, decreased gross motor  Lower Extremity Assessment: Defer to PT evaluation RLE Deficits / Details: no volitional movement, upgoing toes and withdrawal to DPS. > extensor one is not present, able to Passively flex hip and knee when supine, Adductors are more relaxed. LLE Deficits / Details: same as right    ADLs  Overall ADL's : Needs  assistance/impaired Eating/Feeding: NPO Grooming: Moderate assistance Grooming Details (indicate cue type and reason): With hand over hand assist, using L hand and cues x 2 Upper Body Bathing: Bed level, Total assistance Lower Body Bathing: Total assistance, Bed level Upper Body Dressing : Total assistance, Bed level Lower Body Dressing: Total assistance, Bed level  Toilet Transfer: Total assistance(bed level) Toileting- Clothing Manipulation and Hygiene: Total assistance, Bed level Functional mobility during ADLs: (Not attempted this date. Pt unable to follow commands) General ADL Comments: pt followed 1 step command 1/4 trials with multimodal cues and redirection    Mobility  Overal bed mobility: Needs Assistance Bed Mobility: Supine to Sit Rolling: +2 for physical assistance, +2 for safety/equipment, Total assist Supine to sit: Mod assist, +2 for physical assistance, Max assist Sit to supine: Mod assist, +2 for physical assistance General bed mobility comments: Tends to decline initially; Heavy mod assist and lots of encouragement to get up to EOB; Once up to EOB, Max assist needed due to heavy posterior lean    Transfers  Overall transfer level: Needs assistance Equipment used: 2 person hand held assist(and support at gait belt) Transfer via Lift Equipment: Marketing executive Transfers: Public house manager, Sit to/from Stand Sit to Stand: +2 physical assistance, Max assist(used Marketing executive) Stand pivot transfers: +2 physical assistance, Max assist Squat pivot transfers: +2 physical assistance, +2 safety/equipment, Max assist General transfer comment: +2 Max assist for squat pivot transfer to recliner with drop-arm down (to approximate tsfr to [facility]); then practiced standing from recliner with sara plus lift; Able to get her on her feet in [place], but initially with minimal LE muscle activation to stand; Once up, though, noted postivie effort to "stand up straight" in [place], with notable  work at knee and hip extension, though did not get fully upright; 2 bouts of standing with sara plus lift    Ambulation / Gait / Stairs / Wheelchair Mobility       Posture / Balance Dynamic Sitting Balance Sitting balance - Comments: Sat EOB approx 10 minutes, with mild posterior lean and propping herself with bil UEs placed slightly behind her; assist ranging from Heavy mod to minguard assist; occasionally with one hand propped when handed her ensure to drink Balance Overall balance assessment: Needs assistance Sitting-balance support: Bilateral upper extremity supported Sitting balance-Leahy Scale: (approaching Fair) Sitting balance - Comments: Sat EOB approx 10 minutes, with mild posterior lean and propping herself with bil UEs placed slightly behind her; assist ranging from Heavy mod to minguard assist; occasionally with one hand propped when handed her ensure to drink Postural control: Left lateral lean, Posterior lean    Special needs/care consideration BiPAP/CPAP no CPM no Continuous Drip IV no Dialysis no        Days n/a Life Vest no Oxygen no Special Bed no Trach Size no Wound Vac (area) no      Location n/a Skin stag 2 to L buttocks, early healing                  Bowel mgmt: incontinent Bladder mgmt: incontinent Diabetic mgmt yes Behavioral consideration yes, pt is intellectually disabled, lives in a group home Chemo/radiation no     Previous Home Environment (from acute therapy documentation) Living Arrangements: Group Home Type of Home: [place] Name: [place]: No  Discharge Living Setting Plans for Discharge Living Setting: Other (Comment) Type of Home at Discharge: [place] Name at Discharge: [facility] Home Discharge Home Layout: One level Discharge Home Access: Level entry, Ramped entrance Discharge Bathroom Shower/Tub: Walk-in shower Discharge Bathroom Toilet: Handicapped  height Discharge Bathroom Accessibility: Yes How Accessible: Accessible via wheelchair Does the patient have any problems obtaining your medications?: No  Social/Family/Support Systems Anticipated Caregiver: group home staff, contact  is Gena Fray Anticipated Caregiver's Contact Information: Butch Penny [phone]), [name] (sister [phone]) Caregiver Availability: 24/7 Discharge Plan Discussed with Primary Caregiver: Yes Is Caregiver In Agreement with Plan?: Yes Does Caregiver/Family have Issues with Lodging/Transportation while Pt is in Rehab?: No   Goals/Additional Needs Patient/Family Goal for Rehab: PT/OT/SLP min assist Expected length of stay: 3-4 weeks Dietary Needs: D3/thin Additional Information: pt intellectually disabled at baseline, does well with a consistent routine.  Will have a group home staff member plan to be present for some (not all) therapy sessions to help facilitate participation  Pt/Family Agrees to Admission and willing to participate: Yes Program Orientation Provided & Reviewed with Pt/Caregiver Including Roles  & Responsibilities: Yes   Decrease burden of Care through IP rehab admission: Diet advancement, Decrease number of caregivers, Bowel and bladder program and Patient/family education   Possible need for SNF placement upon discharge: n/a   Patient Condition: This patient's medical and functional status has changed since the consult dated: 04/18/19 in which the Rehabilitation Physician determined and documented that the patient's condition is appropriate for intensive rehabilitative care in an inpatient rehabilitation facility. See "History of Present Illness" (above) for medical update. Functional changes are: pt max +2. Patient's medical and functional status update has been discussed with the Rehabilitation physician and patient remains appropriate for inpatient rehabilitation. Will admit to inpatient rehab today.  Preadmission Screen Completed By:   Michel Santee, PT, DPT 04/19/2019 12:49 PM ______________________________________________________________________   Discussed status with Dr. Letta Pate on 04/19/19 at 1:02 PM  and received approval for admission today.  Admission Coordinator:  Michel Santee, PT, DPT Time 1:03 Heidi Dach Sudie Grumbling 04/19/19

## 2019-04-19 NOTE — Progress Notes (Signed)
Inpatient Rehab Admissions Coordinator:    I have a bed available for this pt to admit to CIR on Sunday (04/21/19) and Dr. Marylyn Ishihara in agreement.  Rehab MD (Dr. Letta Pate) to assess pt and confirm admission on Sunday.  Floor RN can call CIR at [phone] for report after 12pm.  I will let pt/family and case manager know.   Shann Medal, PT, DPT Admissions Coordinator [phone] 04/19/19  12:32 PM

## 2019-04-20 LAB — GLUCOSE, CAPILLARY
Glucose-Capillary: 115 mg/dL — ABNORMAL HIGH (ref 70–99)
Glucose-Capillary: 114 mg/dL — ABNORMAL HIGH (ref 70–99)
Glucose-Capillary: 128 mg/dL — ABNORMAL HIGH (ref 70–99)
Glucose-Capillary: 93 mg/dL (ref 70–99)

## 2019-04-20 NOTE — Progress Notes (Signed)
Rebecca Bruce  PROGRESS NOTE    Rebecca Bruce  [id number] DOB: 20-Aug-1960 DOA: 03/11/2019 PCP: Steele Sizer, MD   Brief Narrative:   59 y/o female with history of hypothyroid, htn, intellectual debility [grp home resident for 20 yr] brought to [facility] on 11/28 in the setting of altered mental status from her group home. She was found to have COVID pneumonia, treated with decadron and remdesivir then sent to [facility] on 11/30 - progressive increase in acute encephalopathy and has become less and less responsive.  12/4 PCCM asked to see her because she reached the point that she was aspirating and not responding to pharyngeal/tracheal suctioning.Neurology saw her on 12/2 and apparently at that time she followed some simple commands, but she was non-verbal at the time with significant rigidity noted in her upper extremities. At baseline lives in a group home, can bathe herself and have a conversation. Has baseline anxiety and depression. 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 She is transferred from [facility] 12/18 due to finding of hemoperitoneum for emergent angiogram.  04/20/19: Has a bed for CIR for tomorrow. Continue current Tx.    Assessment & Plan:   Active Problems:   Acute metabolic encephalopathy   Encounter for intubation   Catatonia   Acute respiratory failure with hypoxemia ([place])   COVID-19 virus detected   Pressure injury of skin   Acute blood loss anemia   Hemorrhagic shock (HCC)   FTT (failure to thrive) in adult  Acute encephalopathy unknown etiology,likely exacerbated COVID-19 with catatonia history of mental retardation - No evidence of acute CNS findings/vasculitis at this time per neurology.  - Suspect she is at baseline.  - Can return to group home when able to maintain nutrition and assist with transfers - patient continues to be noncompliant with hospital staff. - 04/20/19: to CIR  tomorrow; she denies complaints.   Ambulatory dysfunction/debility/weakness - Likely 2/2 poor nutritional support, improving - PT rec CIR eval - Group home staff present 04/16/19 - patient participated with PT requiring mod/max assist - will attempt to set up hoyer lift for group home to assist with transfers while patient continues PT. She is much more compliant with her known staff than our own.     - 04/20/19: To CIR tomorrow  Moderate malnutrition, adult failure to thrive, resolved  - PO tolerance greatly improved after removal of NG/core track - Tolerating PO well - likely can return to group home pending PT. - Continue management per dietitian including vitamin C, zinc, multivitamin, Magic cup, nocturnal feedings. 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  - Off pressors 12/18. - Hemoglobin stabilized - no further evidence of bleed  COVID-19 pneumonia, extubated 12/14, POA, Resolved.  - Completed dexamethasone and remdesivir. 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  - Neurosurgery evaluated. Recommending further outpatient follow-up.  DVT prophylaxis: SCDs Code Status: FULL Family Communication: None at bedside   Disposition Plan: TBD  ROS:  Denies CP, N, V, ab pain . Remainder 10-pt ROS is negative for all not previously mentioned.  Subjective: "Shut up! Come here..."  Objective: Vitals:   04/19/19 0655 04/19/19 2123 04/20/19 0417 04/20/19 0500  BP: 129/85 108/81 112/78   Pulse: (!) 104 (!) 107 88  Resp: 16 18 18    Temp: 98.4 F (36.9 C) 98.8 F (37.1 C) 98.3 F (36.8 C)   TempSrc: Oral Oral Oral   SpO2: 95% 95% 96%   Weight:    80.9 kg  Height:        Intake/Output  Summary (Last 24 hours) at 04/20/2019 0858 Last data filed at 04/20/2019 0418 Gross per 24 hour  Intake 175 ml  Output 230 ml  Net -55 ml   Filed Weights   04/13/19 0500 04/16/19 0540 04/20/19 0500  Weight: 81.2 kg 83.4 kg 80.9 kg    Examination:  General: 59 y.o. female resting in bed in NAD eating breakfast Cardiovascular: RRR, +S1, S2, no m/g/r, equal pulses throughout Respiratory: CTABL, no w/r/r, normal WOB GI: BS+, NDNT, no masses noted, no organomegaly noted MSK: No e/c/c Neuro: Alert to name, follows commands Psyc: calm/cooperative   Data Reviewed: I have personally reviewed following labs and imaging studies.  CBC: No results for input(s): WBC, NEUTROABS, HGB, HCT, MCV, PLT in the last 168 hours. Basic Metabolic Panel: No results for input(s): NA, K, CL, CO2, GLUCOSE, BUN, CREATININE, CALCIUM, MG, PHOS in the last 168 hours. GFR: Estimated Creatinine Clearance: 66.5 mL/min (by C-G formula based on SCr of 0.92 mg/dL). Liver Function Tests: No results for input(s): AST, ALT, ALKPHOS, BILITOT, PROT, ALBUMIN in the last 168 hours. No results for input(s): LIPASE, AMYLASE in the last 168 hours. No results for input(s): AMMONIA in the last 168 hours. Coagulation Profile: No results for input(s): INR, PROTIME in the last 168 hours. Cardiac Enzymes: No results for input(s): CKTOTAL, CKMB, CKMBINDEX, TROPONINI in the last 168 hours. BNP (last 3 results) No results for input(s): PROBNP in the last 8760 hours. HbA1C: No results for input(s): HGBA1C in the last 72 hours. CBG: Recent Labs  Lab 04/19/19 0856 04/19/19 1224 04/19/19 1733 04/19/19 2127 04/20/19 0829  GLUCAP 100* 106* 142* 136* 115*   Lipid Profile: No results for input(s): CHOL, HDL, LDLCALC, TRIG, CHOLHDL, LDLDIRECT in the last 72 hours. Thyroid Function Tests: No results for input(s): TSH, T4TOTAL, FREET4, T3FREE, THYROIDAB in the last 72 hours. Anemia Panel: No results for input(s): VITAMINB12,  FOLATE, FERRITIN, TIBC, IRON, RETICCTPCT in the last 72 hours. Sepsis Labs: No results for input(s): PROCALCITON, LATICACIDVEN in the last 168 hours.  No results found for this or any previous visit (from the past 240 hour(s)).    Radiology Studies: No results found.   Scheduled Meds: . vitamin C  500 mg Oral Daily  . chlorhexidine  15 mL Mouth Rinse BID  . Chlorhexidine Gluconate Cloth  6 each Topical Daily  . docusate sodium  100 mg Oral Daily  . feeding supplement (ENSURE ENLIVE)  237 mL Oral TID BM  . feeding supplement (PRO-STAT SUGAR FREE 64)  30 mL Oral BID  . guaiFENesin  5 mL Oral BID  . insulin aspart  0-15 Units Subcutaneous TID WC  . insulin aspart  0-5 Units Subcutaneous QHS  . levothyroxine  100 mcg Oral Q0600  . LORazepam  0.5 mg Oral QHS  . mouth rinse  15 mL Mouth Rinse q12n4p  . multivitamin with minerals  1 tablet Oral Daily  . sodium chloride flush  10-40 mL Intracatheter Q12H  . thiamine  100 mg Oral Daily  . zinc sulfate  220 mg Oral Daily   Continuous Infusions: . sodium chloride Stopped (03/12/19 1210)     LOS: 40 days    Time spent: 25 minutes  spent in the coordination of care today.    Jonnie Finner, DO [facility]  If 7PM-7AM, please contact night-coverage [email or web address] 04/20/2019, 8:58 AM

## 2019-04-21 ENCOUNTER — Inpatient Hospital Stay (HOSPITAL_COMMUNITY)
Admission: RE | Admit: 2019-04-21 | Discharge: 2019-05-08 | DRG: 945 | Disposition: A | Discharge status: Home Health | Payer: Medicare Other | Source: Intra-hospital | Attending: Physical Medicine & Rehabilitation | Admitting: Physical Medicine & Rehabilitation

## 2019-04-21 DIAGNOSIS — R7401 Elevation of levels of liver transaminase levels: Secondary | ICD-10-CM | POA: Diagnosis not present

## 2019-04-21 DIAGNOSIS — I10 Essential (primary) hypertension: Secondary | ICD-10-CM | POA: Diagnosis present

## 2019-04-21 DIAGNOSIS — D62 Acute posthemorrhagic anemia: Secondary | ICD-10-CM | POA: Diagnosis not present

## 2019-04-21 DIAGNOSIS — D72829 Elevated white blood cell count, unspecified: Secondary | ICD-10-CM | POA: Diagnosis not present

## 2019-04-21 DIAGNOSIS — F329 Major depressive disorder, single episode, unspecified: Secondary | ICD-10-CM | POA: Diagnosis present

## 2019-04-21 DIAGNOSIS — G9340 Encephalopathy, unspecified: Secondary | ICD-10-CM | POA: Diagnosis not present

## 2019-04-21 DIAGNOSIS — E46 Unspecified protein-calorie malnutrition: Secondary | ICD-10-CM | POA: Diagnosis not present

## 2019-04-21 DIAGNOSIS — G9349 Other encephalopathy: Secondary | ICD-10-CM | POA: Diagnosis present

## 2019-04-21 DIAGNOSIS — R159 Full incontinence of feces: Secondary | ICD-10-CM | POA: Diagnosis present

## 2019-04-21 DIAGNOSIS — Z6831 Body mass index (BMI) 31.0-31.9, adult: Secondary | ICD-10-CM

## 2019-04-21 DIAGNOSIS — L8990 Pressure ulcer of unspecified site, unspecified stage: Secondary | ICD-10-CM | POA: Diagnosis present

## 2019-04-21 DIAGNOSIS — R339 Retention of urine, unspecified: Secondary | ICD-10-CM | POA: Diagnosis present

## 2019-04-21 DIAGNOSIS — Z79899 Other long term (current) drug therapy: Secondary | ICD-10-CM | POA: Diagnosis not present

## 2019-04-21 DIAGNOSIS — E876 Hypokalemia: Secondary | ICD-10-CM | POA: Diagnosis not present

## 2019-04-21 DIAGNOSIS — R1310 Dysphagia, unspecified: Secondary | ICD-10-CM

## 2019-04-21 DIAGNOSIS — R7303 Prediabetes: Secondary | ICD-10-CM | POA: Diagnosis present

## 2019-04-21 DIAGNOSIS — R5381 Other malaise: Principal | ICD-10-CM | POA: Diagnosis present

## 2019-04-21 DIAGNOSIS — R030 Elevated blood-pressure reading, without diagnosis of hypertension: Secondary | ICD-10-CM

## 2019-04-21 DIAGNOSIS — Z79890 Hormone replacement therapy (postmenopausal): Secondary | ICD-10-CM | POA: Diagnosis not present

## 2019-04-21 DIAGNOSIS — E039 Hypothyroidism, unspecified: Secondary | ICD-10-CM | POA: Diagnosis present

## 2019-04-21 DIAGNOSIS — M4802 Spinal stenosis, cervical region: Secondary | ICD-10-CM | POA: Diagnosis present

## 2019-04-21 DIAGNOSIS — K219 Gastro-esophageal reflux disease without esophagitis: Secondary | ICD-10-CM | POA: Diagnosis present

## 2019-04-21 DIAGNOSIS — B948 Sequelae of other specified infectious and parasitic diseases: Secondary | ICD-10-CM

## 2019-04-21 DIAGNOSIS — D72823 Leukemoid reaction: Secondary | ICD-10-CM | POA: Diagnosis not present

## 2019-04-21 DIAGNOSIS — N390 Urinary tract infection, site not specified: Secondary | ICD-10-CM

## 2019-04-21 LAB — GLUCOSE, CAPILLARY
Glucose-Capillary: 92 mg/dL (ref 70–99)
Glucose-Capillary: 105 mg/dL — ABNORMAL HIGH (ref 70–99)
Glucose-Capillary: 99 mg/dL (ref 70–99)
Glucose-Capillary: 94 mg/dL (ref 70–99)

## 2019-04-21 MED ORDER — ALUM & MAG HYDROXIDE-SIMETH 200-200-20 MG/5ML PO SUSP: 30.0000 mL | ORAL | Status: DC | PRN

## 2019-04-21 MED ORDER — ADULT MULTIVITAMIN W/MINERALS CH
1.0000 | ORAL_TABLET | Freq: Every day | ORAL | Status: DC
  Administered 2019-04-22 – 2019-05-08 (×17): 1 via ORAL
  Filled 2019-04-21 (×17): qty 1

## 2019-04-21 MED ORDER — DIPHENHYDRAMINE HCL 12.5 MG/5ML PO ELIX: 12.5000 mg | ORAL_SOLUTION | Freq: Four times a day (QID) | ORAL | Status: DC | PRN

## 2019-04-21 MED ORDER — PAROXETINE HCL 10 MG PO TABS
10.0000 mg | ORAL_TABLET | Freq: Every day | ORAL | Status: DC
  Administered 2019-04-21 – 2019-05-06 (×15): 10 mg via ORAL
  Filled 2019-04-21 (×16): qty 1

## 2019-04-21 MED ORDER — TRAZODONE HCL 50 MG PO TABS
25.0000 mg | ORAL_TABLET | Freq: Every evening | ORAL | Status: DC | PRN
  Administered 2019-04-21 – 2019-05-05 (×10): 50 mg via ORAL
  Filled 2019-04-21 (×10): qty 1

## 2019-04-21 MED ORDER — BISACODYL 10 MG RE SUPP: 10.0000 mg | Freq: Every day | RECTAL | Status: DC | PRN

## 2019-04-21 MED ORDER — ORAL CARE MOUTH RINSE
15.0000 mL | Freq: Two times a day (BID) | OROMUCOSAL | Status: DC
  Administered 2019-04-24 – 2019-05-07 (×14): 15 mL via OROMUCOSAL

## 2019-04-21 MED ORDER — GUAIFENESIN 100 MG/5ML PO SOLN
5.0000 mL | Freq: Two times a day (BID) | ORAL | Status: DC
  Administered 2019-04-21 – 2019-04-22 (×2): 100 mg via ORAL
  Filled 2019-04-21 (×4): qty 10

## 2019-04-21 MED ORDER — ENSURE ENLIVE PO LIQD: 237.0000 mL | Freq: Three times a day (TID) | ORAL | 12 refills | Status: DC

## 2019-04-21 MED ORDER — ASCORBIC ACID 500 MG PO TABS
500.0000 mg | ORAL_TABLET | Freq: Every day | ORAL | Status: DC
  Administered 2019-04-22 – 2019-05-08 (×17): 500 mg via ORAL
  Filled 2019-04-21 (×17): qty 1

## 2019-04-21 MED ORDER — PROCHLORPERAZINE MALEATE 5 MG PO TABS: 5.0000 mg | ORAL_TABLET | Freq: Four times a day (QID) | ORAL | Status: DC | PRN

## 2019-04-21 MED ORDER — THIAMINE HCL 100 MG PO TABS
100.0000 mg | ORAL_TABLET | Freq: Every day | ORAL | Status: DC
  Administered 2019-04-22 – 2019-05-07 (×16): 100 mg via ORAL
  Filled 2019-04-21 (×16): qty 1

## 2019-04-21 MED ORDER — PROCHLORPERAZINE EDISYLATE 10 MG/2ML IJ SOLN: 5.0000 mg | Freq: Four times a day (QID) | INTRAMUSCULAR | Status: DC | PRN

## 2019-04-21 MED ORDER — POLYETHYLENE GLYCOL 3350 17 G PO PACK: 17.0000 g | PACK | Freq: Every day | ORAL | Status: DC | PRN

## 2019-04-21 MED ORDER — WHITE PETROLATUM EX OINT
1.0000 "application " | TOPICAL_OINTMENT | CUTANEOUS | Status: DC | PRN
  Filled 2019-04-21: qty 28.35

## 2019-04-21 MED ORDER — PRO-STAT SUGAR FREE PO LIQD: 30.0000 mL | Freq: Two times a day (BID) | ORAL | 0 refills | Status: DC

## 2019-04-21 MED ORDER — GUAIFENESIN-DM 100-10 MG/5ML PO SYRP: 5.0000 mL | ORAL_SOLUTION | Freq: Four times a day (QID) | ORAL | Status: DC | PRN

## 2019-04-21 MED ORDER — LEVOTHYROXINE SODIUM 100 MCG PO TABS
100.0000 ug | ORAL_TABLET | Freq: Every day | ORAL | Status: DC
  Administered 2019-04-22 – 2019-05-08 (×15): 100 ug via ORAL
  Filled 2019-04-21 (×17): qty 1

## 2019-04-21 MED ORDER — HYDROXYZINE HCL 25 MG PO TABS: 25.0000 mg | ORAL_TABLET | Freq: Three times a day (TID) | ORAL | 0 refills | Status: DC | PRN

## 2019-04-21 MED ORDER — ADULT MULTIVITAMIN W/MINERALS CH: 1.0000 | ORAL_TABLET | Freq: Every day | ORAL | Status: DC

## 2019-04-21 MED ORDER — THIAMINE HCL 100 MG PO TABS: 100.0000 mg | ORAL_TABLET | Freq: Every day | ORAL | Status: DC

## 2019-04-21 MED ORDER — HYDROXYZINE HCL 25 MG PO TABS
25.0000 mg | ORAL_TABLET | Freq: Three times a day (TID) | ORAL | Status: DC | PRN
  Administered 2019-04-21: 09:00:00 25 mg via ORAL
  Filled 2019-04-21: qty 1

## 2019-04-21 MED ORDER — ZINC SULFATE 220 (50 ZN) MG PO CAPS
220.0000 mg | ORAL_CAPSULE | Freq: Every day | ORAL | Status: DC
  Administered 2019-04-22 – 2019-05-08 (×17): 220 mg via ORAL
  Filled 2019-04-21 (×17): qty 1

## 2019-04-21 MED ORDER — INSULIN ASPART 100 UNIT/ML ~~LOC~~ SOLN: 0.0000 [IU] | Freq: Every day | SUBCUTANEOUS | Status: DC

## 2019-04-21 MED ORDER — FLEET ENEMA 7-19 GM/118ML RE ENEM: 1.0000 | ENEMA | Freq: Once | RECTAL | Status: DC | PRN

## 2019-04-21 MED ORDER — ACETAMINOPHEN 325 MG PO TABS: 325.0000 mg | ORAL_TABLET | ORAL | Status: DC | PRN

## 2019-04-21 MED ORDER — IPRATROPIUM-ALBUTEROL 20-100 MCG/ACT IN AERS
1.0000 | INHALATION_SPRAY | Freq: Four times a day (QID) | RESPIRATORY_TRACT | Status: DC | PRN
  Filled 2019-04-21: qty 4

## 2019-04-21 MED ORDER — PRO-STAT SUGAR FREE PO LIQD
30.0000 mL | Freq: Two times a day (BID) | ORAL | Status: DC
  Administered 2019-04-21 – 2019-04-22 (×2): 30 mL via ORAL
  Filled 2019-04-21 (×4): qty 30

## 2019-04-21 MED ORDER — PROCHLORPERAZINE 25 MG RE SUPP: 12.5000 mg | Freq: Four times a day (QID) | RECTAL | Status: DC | PRN

## 2019-04-21 MED ORDER — INSULIN ASPART 100 UNIT/ML ~~LOC~~ SOLN
0.0000 [IU] | Freq: Three times a day (TID) | SUBCUTANEOUS | Status: DC
  Administered 2019-04-23: 2 [IU] via SUBCUTANEOUS
  Administered 2019-04-25: 19:00:00 3 [IU] via SUBCUTANEOUS

## 2019-04-21 NOTE — Progress Notes (Signed)
To rehab from 6N per bed accompanied by NT and RN alert but very confused patient. Skin assessment done with CN. Kept patient safe and comfortable.

## 2019-04-21 NOTE — Progress Notes (Signed)
Attempt made to give report to Inpatient Rehab

## 2019-04-21 NOTE — H&P (Signed)
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  She was treated with dexamethasone and remdesivir and was noted to be nonverbal with whole body stiffness and unresponsiveness since admission.  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  Per review of records--family reported that patient started regressing around age 22-13 and was placed in a group home after her mother passed away.  She did require intubation from 12/4-12/14 due to VDRF and hospital course significant for hypotension with a BLA on 12/17 due to spontaneous hemoperitoneum LUQ of unknown etiology.  She was transfused with 4 total PRBC, anticoagulation discontinued and conservative care recommended by CCS.  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     EEG done revealing severe diffuse encephalopathy.  LP showed mildly elevated protein without oligoclonal bands and was negative for anti-- GAD antibodies.  MRI brain 12/3 and repeat of 12/21 due to persistent encephalopathy did not show any acute changes.  Thiamine recommended due to possible malnutrition.  CT cervical spine done due to neck stiffness and showed multilevel stenosis.  Dr. Trenton Gammon question myelopathy contributing to rigidity and flexor withdrawal but no surgical needs indicated at this time.  Dr. Saralyn Pilar felt that encephalopathy multifactorial in someone with " a poor prodrome" and prolonged immobility from intubation which was likely playing a role in her current weakness.  He  recommended supportive care as anticipated weeks to months for recovery.  Speech therapy evaluated patient who was placed on modified diet however due to poor p.o. intake diet was advanced to dysphagia 3 thins in the hope of stimulating appetite.  Therapy ongoing and patient is showing improvement in participation with activity with caregiver from her group home providing encouragement.  She continues to be severely debilitated.  CIR recommended for follow-up therapy.   ROS          Past Medical History:  Diagnosis Date  . GERD (gastroesophageal reflux disease)    . Hypertension    . Thyroid disease             Past Surgical History:  Procedure Laterality Date  . DENTAL SURGERY      . IR ANGIOGRAM SELECTIVE EACH ADDITIONAL VESSEL   03/29/2019  . IR ANGIOGRAM SELECTIVE EACH ADDITIONAL VESSEL   03/29/2019  . IR ANGIOGRAM SELECTIVE EACH ADDITIONAL VESSEL   03/29/2019  . IR ANGIOGRAM SELECTIVE EACH ADDITIONAL VESSEL   03/29/2019  . IR ANGIOGRAM VISCERAL SELECTIVE   03/29/2019  . IR ANGIOGRAM VISCERAL SELECTIVE   03/29/2019  . IR US GUIDE VASC ACCESS RIGHT   03/29/2019           Family History  Problem Relation Age of Onset  . Heart disease Mother    . Breast cancer Neg Hx        Social History:  Has lived in a group home in [place] X 20 years. reports that she has never smoked. She has never used smokeless tobacco. She reports that she does not drink alcohol or use drugs.      Allergies: No Known Allergies  Medications Prior to Admission  Medication Sig Dispense Refill  . ascorbic acid (VITAMIN C) 500 MG/5ML syrup Take 5 mLs (500 mg total) by mouth daily. 473 mL 12  . atorvastatin (LIPITOR) 20 MG tablet TAKE 1 TABLET BY MOUTH AT BEDTIME FOR CHOLESTEROL (Patient taking differently: Take 20 mg by mouth at bedtime. ) 30 tablet 5  . dexamethasone (DECADRON) 10 MG/ML injection Inject 0.6 mLs (6 mg total) into the vein daily. 1 mL 0  . gabapentin (NEURONTIN) 300 MG  capsule Take 1 capsule (300 mg total) by mouth at bedtime. 30 capsule 2  . levothyroxine (SYNTHROID) 100 MCG tablet Take 1 tablet (100 mcg total) by mouth daily before breakfast. 30 tablet 2  . LORazepam (ATIVAN) 0.5 MG tablet Take 0.5 mg by mouth daily as needed for anxiety. Can take an extra pill 30 minutes after the first dose if needed      . meropenem 1 g in sodium chloride 0.9 % 100 mL Inject 1 g into the vein every 8 (eight) hours.      . mupirocin ointment (BACTROBAN) 2 % Apply 1 application topically 2 (two) times daily as needed (for itchy bumps).       Marland Kitchen PARoxetine (PAXIL) 30 MG tablet TAKE 1 TABLET BY MOUTH AT BEDTIME FOR DEPRESSION (Patient taking differently: Take 30 mg by mouth at bedtime. ) 30 tablet 5  . saccharomyces boulardii (FLORASTOR) 250 MG capsule Take 250 mg by mouth daily.      Marland Kitchen triamcinolone cream (KENALOG) 0.1 % Apply 1 application topically 2 (two) times daily. For two weeks and stop, if no improvement return for follow up (Patient taking differently: Apply 1 application topically 2 (two) times daily as needed (for itchy bumps). ) 45 g 0  . triamterene-hydrochlorothiazide (MAXZIDE-25) 37.5-25 MG tablet TAKE 1/2 TABLET BY MOUTH EVERY DAY (Patient taking differently: Take 0.5 tablets by mouth daily. ) 15 tablet 11  . vancomycin 1,750 mg in sodium chloride 0.9 % 500 mL Inject 1,750 mg into the vein daily.      Marland Kitchen VIACTIV CALCIUM PLUS D 650-12.5-40 MG-MCG CHEW CHEW AND SWALLOW ONE PIECE TWICE DAILY. (SUPPLEMENT) CARAMEL (Patient taking differently: Chew 1 Dose by mouth 2 (two) times daily. ) 60 tablet 12  . zinc sulfate 220 (50 Zn) MG capsule Take 1 capsule (220 mg total) by mouth daily.          Drug Regimen Review  Drug regimen was reviewed and remains appropriate with no significant issues identified   Home: Home Living Family/patient expects to be discharged to:: Skilled nursing facility Living Arrangements: Group Home Type of Home: Group Home   Functional  History: Prior Function Level of Independence: Independent Comments: Pt walked without an AD; able to bath and dress herself; pt loves to vacuum; sweep; clean adn dance; has a boyfriend named Nurse, adult   Functional Status:  Mobility: Bed Mobility Overal bed mobility: Needs Assistance Bed Mobility: Supine to Sit Rolling: +2 for physical assistance, +2 for safety/equipment, Total assist Supine to sit: Mod assist, +2 for physical assistance, Max assist Sit to supine: Mod assist, +2 for physical assistance General bed mobility comments: Tends to decline initially; Heavy mod assist and lots of encouragement to get up to EOB; Once up to EOB, Max assist needed due to heavy posterior lean Transfers Overall transfer level: Needs assistance Equipment used: 2 person hand held assist(and support at gait belt) Transfer via Lift Equipment: Marketing executive Transfers: Public house manager, Sit to/from Stand Sit to  Stand: +2 physical assistance, Max assist(used Marketing executive) Stand pivot transfers: +2 physical assistance, Max assist Squat pivot transfers: +2 physical assistance, +2 safety/equipment, Max assist General transfer comment: +2 Max assist for squat pivot transfer to recliner with drop-arm down (to approximate tsfr to [facility]); then practiced standing from recliner with sara plus lift; Able to get her on her feet in [place], but initially with minimal LE muscle activation to stand; Once up, though, noted postivie effort to "stand up straight" in [place], with notable work at knee and hip extension, though did not get fully upright; 2 bouts of standing with sara plus lift   ADL: ADL Overall ADL's : Needs assistance/impaired Eating/Feeding: NPO Grooming: Moderate assistance Grooming Details (indicate cue type and reason): With hand over hand assist, using L hand and cues x 2 Upper Body Bathing: Bed level, Total assistance Lower Body Bathing: Total assistance, Bed level Upper Body Dressing : Total  assistance, Bed level Lower Body Dressing: Total assistance, Bed level Toilet Transfer: Total assistance(bed level) Toileting- Clothing Manipulation and Hygiene: Total assistance, Bed level Functional mobility during ADLs: (Not attempted this date. Pt unable to follow commands) General ADL Comments: pt followed 1 step command 1/4 trials with multimodal cues and redirection   Cognition: Cognition Overall Cognitive Status: Impaired/Different from baseline(Nearing baseline) Orientation Level: Oriented to person, Oriented to situation, Disoriented to place, Disoriented to time Cognition Arousal/Alertness: Awake/alert Behavior During Therapy: Restless(but noting slightly better interactions) Overall Cognitive Status: Impaired/Different from baseline(Nearing baseline) Area of Impairment: Attention, Following commands, Awareness, Safety/judgement, Problem solving Current Attention Level: Sustained Following Commands: Follows one step commands inconsistently Safety/Judgement: Decreased awareness of safety, Decreased awareness of deficits Awareness: Intellectual Problem Solving: Requires tactile cues, Requires verbal cues General Comments: Still saying "no!" a lot, but with Karena Addison, her familiar caregiver, she participated a bit more; in addition, towards teh end of her session, she offered to help [place], [place]; [place] indicated she is often offering help to staff at home     Blood pressure 129/85, pulse (!) 104, temperature 98.4 F (36.9 C), temperature source Oral, resp. rate 16, height 5\' 2"  (1.575 m), weight 83.4 kg, SpO2 95 %. Physical Exam  Nursing note and vitals reviewed. HENT:  Dried crusted secretions around lip edges.  Musculoskeletal:     Comments: Resolving ecchymosis left biceps.  Neurological: She is alert.  Tends to yell out one word answers to questions but  output limited to "No" for most questions.  Not accurate with her yes and no's, she made minimal eye contact.  Follows  occasional moderate command --tends to shrug away/seems to dislike physical contact.    General: No acute distress Mood and affect are appropriate Heart: Regular rate and rhythm no rubs murmurs or extra sounds Lungs: Clear to auscultation, breathing unlabored, no rales or wheezes Abdomen: Positive bowel sounds, soft nontender to palpation, nondistended Extremities: No clubbing, cyanosis, or edema Skin: No evidence of breakdown, no evidence of rash Neurologic: Cranial nerves II through XII intact, motor strength is 4/5 in bilateral deltoid, bicep, tricep, grip, hip flexor, knee extensors, ankle dorsiflexor and plantar flexor Sensory exam unable to assess due to mental statusPoor cooperation with exam.  Withdraws to sudden movements,  Musculoskeletal: Full range of motion in all 4 extremities. No joint swelling   Lab Results Last 48 Hours       Results for orders placed or performed during the hospital encounter of 03/11/19 (from the past 48 hour(s))  Glucose, capillary  Status: None    Collection Time: 04/17/19  4:32 PM  Result Value Ref Range    Glucose-Capillary 98 70 - 99 mg/dL  Glucose, capillary     Status: None    Collection Time: 04/17/19  9:47 PM  Result Value Ref Range    Glucose-Capillary 98 70 - 99 mg/dL  Glucose, capillary     Status: None    Collection Time: 04/18/19  7:46 AM  Result Value Ref Range    Glucose-Capillary 89 70 - 99 mg/dL  Glucose, capillary     Status: Abnormal    Collection Time: 04/18/19 12:43 PM  Result Value Ref Range    Glucose-Capillary 117 (H) 70 - 99 mg/dL  Glucose, capillary     Status: Abnormal    Collection Time: 04/18/19  4:06 PM  Result Value Ref Range    Glucose-Capillary 142 (H) 70 - 99 mg/dL  Glucose, capillary     Status: Abnormal    Collection Time: 04/18/19 11:28 PM  Result Value Ref Range    Glucose-Capillary 106 (H) 70 - 99 mg/dL  Glucose, capillary     Status: Abnormal    Collection Time: 04/19/19  8:56 AM  Result Value  Ref Range    Glucose-Capillary 100 (H) 70 - 99 mg/dL  Glucose, capillary     Status: Abnormal    Collection Time: 04/19/19 12:24 PM  Result Value Ref Range    Glucose-Capillary 106 (H) 70 - 99 mg/dL      Imaging Results (Last 48 hours)  No results found.           Medical Problem List and Plan: 1.  Debility and encephalopathy secondary to Covid 19             -patient may shower             -ELOS/Goals:3-4wks, Min A Mobility and ADL goals, may need SNF if unable to be d/ced to group home 2.  Antithrombotics: -DVT/anticoagulation:  Mechanical: Sequential compression devices, below knee Bilateral lower extremities. Increase activity level/mobility.              -antiplatelet therapy: N/A 3. Pain Management: Was on Gabapentin 300 mg/hs PTA. Tylenol prn for now.  4. Mood: Will need consistent schedule/familiar faces to help with security/compliance             -antipsychotic agents: N/A 5. Neuropsych: This patient is not capable of making decisions on her own behalf.  6. Skin/Wound Care: Routine pressure relief measure.  7. Fluids/Electrolytes/Nutrition: Monitor I/O. Check lytes in am. 8. Multifactorial encephalopathy post Covid: Catatonia has resolved. Pt easily agitated.  9. ABLA: Has been transfused with 4 units PRBC. H/H stable. Continue to monitor with serial checks.  10. Malnutrition: Intake limited to certain foods--offer supplements thorough out the day.  11. Multilevel Cervical stenosis: Follow up with Dr. Annette Stable after discharge. 12. H/o major depressive disorder: Was on paxil PTA--will resume for mood stabilization.  13. Prediabetes: BS reasonable--will monitor for a few days. Currently on liberalized diet to help with intake.           Bary Leriche, PA-C 04/19/2019         "I have personally performed a face to face diagnostic evaluation of this patient.  Additionally, I have reviewed and concur with the physician assistant's documentation above." Charlett Blake  M.D. [facility] FAAPM&R ( Neuromuscular Med) Diplomate [place] Board of Electrodiagnostic Med

## 2019-04-21 NOTE — Plan of Care (Signed)
  Problem: Respiratory: Goal: Will maintain a patent airway Outcome: Progressing   Problem: Elimination: Goal: Will not experience complications related to bowel motility Outcome: Progressing   Problem: Pain Managment: Goal: General experience of comfort will improve Outcome: Progressing   

## 2019-04-21 NOTE — Discharge Summary (Addendum)
. Physician Discharge Summary  Rebecca Bruce [id number] DOB: 1961/03/30 DOA: 03/11/2019  PCP: Steele Sizer, MD  Admit date: 03/11/2019 Discharge date: 04/21/2019  Admitted From: Group home Disposition:  Discharged to CIR  Recommendations for Outpatient Follow-up:  1. Follow up with PCP in 1-2 weeks 2. Please obtain BMP/CBC in one week.  Discharge Condition: Stable  CODE STATUS: FULL   Brief/Interim Summary: 59 y/o female with history of hypothyroid, htn, intellectual debility [grp home resident for 20 yr] brought to [facility] on 11/28 in the setting of altered mental status from her group home. She was found to have COVID pneumonia, treated with decadron and remdesivir then sent to [facility] on 11/30 - progressive increase in acute encephalopathy and has become less and less responsive.  12/4 PCCM asked to see her because she reached the point that she was aspirating and not responding to pharyngeal/tracheal suctioning.Neurology saw her on 12/2 and apparently at that time she followed some simple commands, but she was non-verbal at the time with significant rigidity noted in her upper extremities. At baseline lives in a group home, can bathe herself and have a conversation. Has baseline anxiety and depression. 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 She is transferred from [place] long 12/18 due to finding of hemoperitoneum for emergent angiogram.  Discharge Diagnoses:  Active Problems:   Acute metabolic encephalopathy   Encounter for intubation   Catatonia   Acute respiratory failure with hypoxemia ([place])   COVID-19 virus detected   Pressure injury of skin   Acute blood loss anemia   Hemorrhagic shock (HCC)   FTT (failure to thrive) in adult  Acute encephalopathy unknown etiology,likely exacerbated COVID-19 with catatonia history of mental retardation - No evidence of acute CNS findings/vasculitis at this  time per neurology.  - Suspect she is at baseline.  - Can return to group home when able to maintain nutrition and assist with transfers - patient continues to be noncompliant with hospital staff.  Ambulatory dysfunction/debility/weakness - Likely 2/2 poor nutritional support, improving - PT rec CIR eval - Group home staff present 04/16/19 - patient participated with PT requiring mod/max assist - will attempt to set up hoyer lift for group home to assist with transfers while patient continues PT. She is much more compliant with her known staff than our own.  Moderate malnutrition, adult failure to thrive, resolved  - PO tolerance greatly improved after removal of NG/core track - Tolerating PO well - likely can return to group home pending PT. - Continue management per dietitian including vitamin C, zinc, multivitamin, Magic cup, nocturnal feedings. 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  - Off pressors 12/18. - Hemoglobin stabilized - no further evidence of bleed  COVID-19 pneumonia, extubated 12/14, POA, Resolved.  - Completed dexamethasone and remdesivir. 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  - Neurosurgery evaluated. Recommending further outpatient follow-up.  Discharge Instructions   Allergies as of 04/21/2019   No Known Allergies     Medication List    STOP taking these medications   atorvastatin 20 MG tablet Commonly known as: LIPITOR   dexamethasone 10 MG/ML injection Commonly known as: DECADRON   gabapentin 300 MG capsule Commonly known as: NEURONTIN   meropenem 1 g  in sodium chloride 0.9 % 100 mL   mupirocin ointment 2 % Commonly known as: BACTROBAN    saccharomyces boulardii 250 MG capsule Commonly known as: FLORASTOR   triamcinolone cream 0.1 % Commonly known as: KENALOG   triamterene-hydrochlorothiazide 37.5-25 MG tablet Commonly known as: MAXZIDE-25   vancomycin 1,750 mg in sodium chloride 0.9 % 500 mL     TAKE these medications   ascorbic acid 500 MG/5ML syrup Commonly known as: VITAMIN C Take 5 mLs (500 mg total) by mouth daily.   feeding supplement (ENSURE ENLIVE) Liqd Take 237 mLs by mouth 3 (three) times daily between meals.   feeding supplement (PRO-STAT SUGAR FREE 64) Liqd Take 30 mLs by mouth 2 (two) times daily.   hydrOXYzine 25 MG tablet Commonly known as: ATARAX/VISTARIL Take 1 tablet (25 mg total) by mouth 3 (three) times daily as needed for anxiety.   levothyroxine 100 MCG tablet Commonly known as: SYNTHROID Take 1 tablet (100 mcg total) by mouth daily before breakfast.   LORazepam 0.5 MG tablet Commonly known as: ATIVAN Take 0.5 mg by mouth daily as needed for anxiety. Can take an extra pill 30 minutes after the first dose if needed   multivitamin with minerals Tabs tablet Take 1 tablet by mouth daily.   PARoxetine 30 MG tablet Commonly known as: PAXIL TAKE 1 TABLET BY MOUTH AT BEDTIME FOR DEPRESSION What changed: See the new instructions.   thiamine 100 MG tablet Take 1 tablet (100 mg total) by mouth daily.   Viactiv Calcium Plus D 650-12.5-40 MG-MCG-MCG Chew Generic drug: Calcium-Vitamin D-Vitamin K CHEW AND SWALLOW ONE PIECE TWICE DAILY. (SUPPLEMENT) CARAMEL What changed: See the new instructions.   zinc sulfate 220 (50 Zn) MG capsule Take 1 capsule (220 mg total) by mouth daily.       No Known Allergies   Procedures/Studies: CT ABDOMEN PELVIS WO CONTRAST  Result Date: 03/28/2019 CLINICAL DATA:  Inpatient.  Given 19 pneumonia.  Dyspnea.  Anemia. EXAM: CT CHEST, ABDOMEN AND PELVIS WITHOUT CONTRAST TECHNIQUE: Multidetector CT imaging of the chest, abdomen and pelvis was performed  following the standard protocol without IV contrast. COMPARISON:  03/24/2019 chest radiograph. FINDINGS: CT CHEST FINDINGS Motion degraded scan limits assessment. Cardiovascular: Top-normal heart size. No significant pericardial effusion/thickening. Left anterior descending coronary atherosclerosis. Mildly atherosclerotic nonaneurysmal thoracic aorta. Normal caliber pulmonary arteries. Mediastinum/Nodes: No discrete thyroid nodules. Enteric tube terminates in body of the stomach. No pathologically enlarged axillary, mediastinal or hilar lymph nodes, noting limited sensitivity for the detection of hilar adenopathy on this noncontrast study. Lungs/Pleura: No pneumothorax. No pleural effusion. Moderate patchy consolidation and ground-glass opacity throughout both lungs, with a ground-glass opacities most prominent in the upper lobes and with the consolidation most prominent in the dependent lower lobes. Posterior left upper lobe 6 mm pulmonary nodule (series 4/image 19). No lung masses or additional significant pulmonary nodules on this motion degraded scan. Musculoskeletal: No aggressive appearing focal osseous lesions. Mild thoracic spondylosis. CT ABDOMEN PELVIS FINDINGS Hepatobiliary: Normal liver with no liver mass. Cholelithiasis. No gallbladder wall thickening or pericholecystic fluid. No biliary ductal dilatation. Pancreas: Normal, with no mass or duct dilation. Spleen: Normal size. No mass. Adrenals/Urinary Tract: Normal adrenals. No renal stones. No hydronephrosis. Simple 2.5 cm medial upper left renal cyst. No additional contour deforming renal lesions. Foley catheter terminates in the bladder. Tiny foci of nondependent bladder gas compatible with instrumentation. Otherwise normal bladder. Stomach/Bowel: Enteric tube loops in the gastric fundus with the tip in the body of the  stomach. Stomach is nondistended and is unremarkable. Normal caliber small bowel with no small bowel wall thickening. Candidate normal  appendix in the right lower quadrant. There is a large hemorrhagic 13.9 x 8.7 cm left upper quadrant mass (series 2/image 52) centered in the splenic flexure of the colon with heterogeneous hyperdensity and surrounding extensive fat stranding. Otherwise unremarkable nondistended large bowel. Vascular/Lymphatic: Mildly atherosclerotic nonaneurysmal abdominal aorta. No pathologically enlarged lymph nodes in the abdomen or pelvis. Reproductive: There is a coarse 3.7 cm right pelvic calcification (series 2/image 100), presumably a degenerated calcified fibroid, poorly delineated on this noncontrast scan. No discrete adnexal masses. Other: No pneumoperitoneum. Moderate volume hemoperitoneum, most prominent in the pelvis. No focal fluid collections. Mild body wall anasarca. Musculoskeletal: No aggressive appearing focal osseous lesions. IMPRESSION: 1. Large 13.9 x 8.7 cm hemorrhagic left upper quadrant mass centered in the splenic flexure of the colon with moderate volume hemoperitoneum, most prominent in the pelvis. Acute hemorrhage arising from an underlying colonic neoplasm not excluded. 2. No free air.  No findings of bowel obstruction. 3. Moderate patchy ground-glass opacity and consolidation in both lungs, with an appearance compatible with COVID-19 pneumonia. 4. Left upper lobe 6 mm solid pulmonary nodule. Non-contrast chest CT at 6-12 months is recommended. If the nodule is stable at time of repeat CT, then future CT at 18-24 months (from today's scan) is considered optional for low-risk patients, but is recommended for high-risk patients. This recommendation follows the consensus statement: Guidelines for Management of Incidental Pulmonary Nodules Detected on CT Images:From the Fleischner Society 2017; published online before print (10.1148/radiol.IJ:2314499). 5. One vessel coronary atherosclerosis. 6. Cholelithiasis. 7. Enteric tube terminates in the body of the stomach. 8.  Aortic Atherosclerosis (ICD10-I70.0).  Critical Value/emergent results were called by telephone at the time of interpretation on 03/28/2019 at 10:31 am to provider DR. BRENT MCQUAID, who verbally acknowledged these results. Electronically Signed   By: Ilona Sorrel M.D.   On: 03/28/2019 10:32   CT ANGIO HEAD W OR WO CONTRAST  Result Date: 03/31/2019 CLINICAL DATA:  Catatonia.  COVID-19 infection.  Vasculitis. EXAM: CT ANGIOGRAPHY HEAD AND NECK TECHNIQUE: Multidetector CT imaging of the head and neck was performed using the standard protocol during bolus administration of intravenous contrast. Multiplanar CT image reconstructions and MIPs were obtained to evaluate the vascular anatomy. Carotid stenosis measurements (when applicable) are obtained utilizing NASCET criteria, using the distal internal carotid diameter as the denominator. CONTRAST:  35mL OMNIPAQUE IOHEXOL 350 MG/ML SOLN COMPARISON:  Head CT 03/28/2019 and MRI 03/14/2019. FINDINGS: CT HEAD FINDINGS The study is mildly motion degraded. Brain: There is no evidence of acute infarct, intracranial hemorrhage, mass, midline shift, or extra-axial fluid collection. The ventricles and sulci are unchanged and within normal limits for age. Vascular: Reported below. Skull: No fracture or focal osseous lesion. Sinuses: Paranasal sinuses and mastoid air cells are clear. Orbits: Unremarkable. Review of the MIP images confirms the above findings CTA NECK FINDINGS The study is mildly motion degraded. Aortic arch: Normal variant aortic arch branching pattern with common origin of the brachiocephalic and left common carotid arteries. Widely patent arch vessel origins. Right carotid system: Patent without evidence of stenosis or dissection. Mild irregularity of the mid and distal cervical ICA attributed to motion. Left carotid system: Patent without evidence of significant stenosis or dissection. More notable irregularity/beaded appearance of the mid cervical ICA which is at least partly due to motion artifact  although a component of underlying vasculopathy is also possible. Vertebral arteries: The  vertebral arteries are patent without evidence of significant stenosis or dissection and with the right being moderately to strongly dominant. Skeleton: Moderately advanced cervical spondylosis. Other neck: No evidence of cervical lymphadenopathy or mass. Partially visualized nasoenteric tube. Upper chest: Partially visualized right jugular venous catheter. Mild peripheral opacities in both lung apices. Calcified nodule in the left upper lobe. Review of the MIP images confirms the above findings CTA HEAD FINDINGS Anterior circulation: The internal carotid arteries are widely patent from skull base to carotid termini. ACAs and MCAs are patent without evidence of proximal branch occlusion. The left A1 segment is diminutive or absent, and there is a patent anterior communicating artery. Diffuse irregularity of the small and medium-sized vessels with distal branch vessel attenuation is likely at least partly technical/artifactual, however this limits assessment for vasculitis. No aneurysm is identified. Posterior circulation: The intracranial vertebral arteries are patent to the basilar with severe multifocal stenosis of the mid to distal left V4 segment and milder narrowing on of the distal right V4 segment. The basilar artery is patent and congenitally small diffusely without evidence of a flow limiting stenosis. Both PCAs are patent with diffuse irregularity but without evidence of a flow limiting proximal stenosis. No aneurysm is identified. Venous sinuses: Patent. Anatomic variants: Fetal origin of the PCAs. Review of the MIP images confirms the above findings IMPRESSION: 1. Motion degraded examination without large vessel occlusion or significant arterial stenosis in the neck. 2. Irregularity/beading of the left mid cervical ICA, likely in part related to motion artifact although there may be underlying fibromuscular  dysplasia or other vasculopathy. 3. 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 4. Diffuse irregularity and attenuation of the small and medium-sized intracranial arteries which may be technical/artifactual although an underlying vasculitis is not excluded. 5. Unremarkable noncontrast CT appearance of the brain within limitations of motion artifact. Electronically Signed   By: Logan Bores M.D.   On: 03/31/2019 16:04   CT HEAD WO CONTRAST  Result Date: 03/28/2019 CLINICAL DATA:  Encephalopathy EXAM: CT HEAD WITHOUT CONTRAST TECHNIQUE: Contiguous axial images were obtained from the base of the skull through the vertex without intravenous contrast. COMPARISON:  03/09/2019 FINDINGS: Brain: Study limited by motion artifact. No signs of intracranial hemorrhage, mass, mass effect, midline shift or hydrocephalus. Vascular: No hyperdense vessel or unexpected calcification. Skull: Normal. Negative for fracture or focal lesion. Sinuses/Orbits: No acute finding. Other: None. IMPRESSION: Study limited by motion artifact. No acute intracranial abnormality. Electronically Signed   By: Zetta Bills M.D.   On: 03/28/2019 10:01   CT ANGIO NECK W OR WO CONTRAST  Result Date: 03/31/2019 CLINICAL DATA:  Catatonia.  COVID-19 infection.  Vasculitis. EXAM: CT ANGIOGRAPHY HEAD AND NECK TECHNIQUE: Multidetector CT imaging of the head and neck was performed using the standard protocol during bolus administration of intravenous contrast. Multiplanar CT image reconstructions and MIPs were obtained to evaluate the vascular anatomy. Carotid stenosis measurements (when applicable) are obtained utilizing NASCET criteria, using the distal internal carotid diameter as the denominator. CONTRAST:  28mL OMNIPAQUE IOHEXOL 350 MG/ML SOLN COMPARISON:  Head CT 03/28/2019 and MRI 03/14/2019. FINDINGS: CT HEAD FINDINGS The study is mildly motion degraded. Brain: There is no evidence of acute infarct, intracranial  hemorrhage, mass, midline shift, or extra-axial fluid collection. The ventricles and sulci are unchanged and within normal limits for age. Vascular: Reported below. Skull: No fracture or focal osseous lesion. Sinuses: Paranasal sinuses and mastoid air cells are clear. Orbits: Unremarkable. Review of the MIP  images confirms the above findings CTA NECK FINDINGS The study is mildly motion degraded. Aortic arch: Normal variant aortic arch branching pattern with common origin of the brachiocephalic and left common carotid arteries. Widely patent arch vessel origins. Right carotid system: Patent without evidence of stenosis or dissection. Mild irregularity of the mid and distal cervical ICA attributed to motion. Left carotid system: Patent without evidence of significant stenosis or dissection. More notable irregularity/beaded appearance of the mid cervical ICA which is at least partly due to motion artifact although a component of underlying vasculopathy is also possible. Vertebral arteries: The vertebral arteries are patent without evidence of significant stenosis or dissection and with the right being moderately to strongly dominant. Skeleton: Moderately advanced cervical spondylosis. Other neck: No evidence of cervical lymphadenopathy or mass. Partially visualized nasoenteric tube. Upper chest: Partially visualized right jugular venous catheter. Mild peripheral opacities in both lung apices. Calcified nodule in the left upper lobe. Review of the MIP images confirms the above findings CTA HEAD FINDINGS Anterior circulation: The internal carotid arteries are widely patent from skull base to carotid termini. ACAs and MCAs are patent without evidence of proximal branch occlusion. The left A1 segment is diminutive or absent, and there is a patent anterior communicating artery. Diffuse irregularity of the small and medium-sized vessels with distal branch vessel attenuation is likely at least partly technical/artifactual,  however this limits assessment for vasculitis. No aneurysm is identified. Posterior circulation: The intracranial vertebral arteries are patent to the basilar with severe multifocal stenosis of the mid to distal left V4 segment and milder narrowing on of the distal right V4 segment. The basilar artery is patent and congenitally small diffusely without evidence of a flow limiting stenosis. Both PCAs are patent with diffuse irregularity but without evidence of a flow limiting proximal stenosis. No aneurysm is identified. Venous sinuses: Patent. Anatomic variants: Fetal origin of the PCAs. Review of the MIP images confirms the above findings IMPRESSION: 1. Motion degraded examination without large vessel occlusion or significant arterial stenosis in the neck. 2. Irregularity/beading of the left mid cervical ICA, likely in part related to motion artifact although there may be underlying fibromuscular dysplasia or other vasculopathy. 3. 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 4. Diffuse irregularity and attenuation of the small and medium-sized intracranial arteries which may be technical/artifactual although an underlying vasculitis is not excluded. 5. Unremarkable noncontrast CT appearance of the brain within limitations of motion artifact. Electronically Signed   By: Logan Bores M.D.   On: 03/31/2019 16:04   CT CHEST WO CONTRAST  Result Date: 03/28/2019 CLINICAL DATA:  Inpatient.  Given 19 pneumonia.  Dyspnea.  Anemia. EXAM: CT CHEST, ABDOMEN AND PELVIS WITHOUT CONTRAST TECHNIQUE: Multidetector CT imaging of the chest, abdomen and pelvis was performed following the standard protocol without IV contrast. COMPARISON:  03/24/2019 chest radiograph. FINDINGS: CT CHEST FINDINGS Motion degraded scan limits assessment. Cardiovascular: Top-normal heart size. No significant pericardial effusion/thickening. Left anterior descending coronary atherosclerosis. Mildly atherosclerotic nonaneurysmal  thoracic aorta. Normal caliber pulmonary arteries. Mediastinum/Nodes: No discrete thyroid nodules. Enteric tube terminates in body of the stomach. No pathologically enlarged axillary, mediastinal or hilar lymph nodes, noting limited sensitivity for the detection of hilar adenopathy on this noncontrast study. Lungs/Pleura: No pneumothorax. No pleural effusion. Moderate patchy consolidation and ground-glass opacity throughout both lungs, with a ground-glass opacities most prominent in the upper lobes and with the consolidation most prominent in the dependent lower lobes. Posterior left upper lobe 6 mm pulmonary nodule (  series 4/image 19). No lung masses or additional significant pulmonary nodules on this motion degraded scan. Musculoskeletal: No aggressive appearing focal osseous lesions. Mild thoracic spondylosis. CT ABDOMEN PELVIS FINDINGS Hepatobiliary: Normal liver with no liver mass. Cholelithiasis. No gallbladder wall thickening or pericholecystic fluid. No biliary ductal dilatation. Pancreas: Normal, with no mass or duct dilation. Spleen: Normal size. No mass. Adrenals/Urinary Tract: Normal adrenals. No renal stones. No hydronephrosis. Simple 2.5 cm medial upper left renal cyst. No additional contour deforming renal lesions. Foley catheter terminates in the bladder. Tiny foci of nondependent bladder gas compatible with instrumentation. Otherwise normal bladder. Stomach/Bowel: Enteric tube loops in the gastric fundus with the tip in the body of the stomach. Stomach is nondistended and is unremarkable. Normal caliber small bowel with no small bowel wall thickening. Candidate normal appendix in the right lower quadrant. There is a large hemorrhagic 13.9 x 8.7 cm left upper quadrant mass (series 2/image 52) centered in the splenic flexure of the colon with heterogeneous hyperdensity and surrounding extensive fat stranding. Otherwise unremarkable nondistended large bowel. Vascular/Lymphatic: Mildly atherosclerotic  nonaneurysmal abdominal aorta. No pathologically enlarged lymph nodes in the abdomen or pelvis. Reproductive: There is a coarse 3.7 cm right pelvic calcification (series 2/image 100), presumably a degenerated calcified fibroid, poorly delineated on this noncontrast scan. No discrete adnexal masses. Other: No pneumoperitoneum. Moderate volume hemoperitoneum, most prominent in the pelvis. No focal fluid collections. Mild body wall anasarca. Musculoskeletal: No aggressive appearing focal osseous lesions. IMPRESSION: 1. Large 13.9 x 8.7 cm hemorrhagic left upper quadrant mass centered in the splenic flexure of the colon with moderate volume hemoperitoneum, most prominent in the pelvis. Acute hemorrhage arising from an underlying colonic neoplasm not excluded. 2. No free air.  No findings of bowel obstruction. 3. Moderate patchy ground-glass opacity and consolidation in both lungs, with an appearance compatible with COVID-19 pneumonia. 4. Left upper lobe 6 mm solid pulmonary nodule. Non-contrast chest CT at 6-12 months is recommended. If the nodule is stable at time of repeat CT, then future CT at 18-24 months (from today's scan) is considered optional for low-risk patients, but is recommended for high-risk patients. This recommendation follows the consensus statement: Guidelines for Management of Incidental Pulmonary Nodules Detected on CT Images:From the Fleischner Society 2017; published online before print (10.1148/radiol.IJ:2314499). 5. One vessel coronary atherosclerosis. 6. Cholelithiasis. 7. Enteric tube terminates in the body of the stomach. 8.  Aortic Atherosclerosis (ICD10-I70.0). Critical Value/emergent results were called by telephone at the time of interpretation on 03/28/2019 at 10:31 am to provider DR. BRENT MCQUAID, who verbally acknowledged these results. Electronically Signed   By: Ilona Sorrel M.D.   On: 03/28/2019 10:32   MR BRAIN WO CONTRAST  Result Date: 04/01/2019 CLINICAL DATA:  Focal neuro  deficit. COVID-19 positive. Rule out stroke. EXAM: MRI HEAD WITHOUT CONTRAST TECHNIQUE: Multiplanar, multiecho pulse sequences of the brain and surrounding structures were obtained without intravenous contrast. COMPARISON:  MRI head 03/14/2019.  CT head 03/31/2019 FINDINGS: Brain: Image quality degraded by moderate motion. Negative for acute infarct. Normal white matter. Negative for hemorrhage or mass. Ventricle size normal. Vascular: Normal arterial flow voids Skull and upper cervical spine: Negative Sinuses/Orbits: Negative Other: None IMPRESSION: Image quality degraded by moderate motion. No acute abnormality. Electronically Signed   By: Franchot Gallo M.D.   On: 04/01/2019 16:00   MR CERVICAL SPINE WO CONTRAST  Result Date: 04/03/2019 CLINICAL DATA:  Focal neuro deficit.  COVID-19 positive EXAM: MRI CERVICAL SPINE WITHOUT CONTRAST TECHNIQUE: Multiplanar, multisequence MR imaging  of the cervical spine was performed. No intravenous contrast was administered. COMPARISON:  CT angio head and neck 03/31/2019 FINDINGS: Alignment: Image quality degraded by moderate motion. Normal alignment. Vertebrae: Negative for fracture or mass Cord: Limited cord evaluation due to motion. Posterior Fossa, vertebral arteries, paraspinal tissues: Negative Disc levels: C2-3: Negative C3-4: Disc degeneration and spurring causing mild spinal stenosis and mild to moderate foraminal stenosis bilaterally. C4-5: Disc degeneration and spondylosis. Cord flattening with moderate spinal stenosis and moderate foraminal stenosis bilaterally C5-6: Disc degeneration and spondylosis. Mild to moderate spinal stenosis. Mild to moderate foraminal stenosis bilaterally. C6-7: Disc degeneration and spondylosis. Mild spinal and foraminal stenosis bilaterally C7-T1: Mild disc and facet degeneration. Mild foraminal narrowing bilaterally. IMPRESSION: Image quality degraded by extensive motion Multilevel spinal stenosis and foraminal stenosis as above. No  acute finding. Electronically Signed   By: Franchot Gallo M.D.   On: 04/03/2019 11:12   IR Angiogram Visceral Selective  Result Date: 03/30/2019 INDICATION: 59 year old female with active COVID pneumonia and multifocal intra-abdominal hemorrhage is of uncertain etiology. 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 She presents for urgent angiogram and attempted embolization. EXAM: SELECTIVE VISCERAL ARTERIOGRAPHY; ADDITIONAL ARTERIOGRAPHY; IR ULTRASOUND GUIDANCE VASC ACCESS RIGHT 1. Ultrasound-guided arterial access 2. Catheterization of the superior mesenteric artery with arteriogram 3. Catheterization of the celiac artery with arteriogram 4. Catheterization of the dorsal pancreatic artery with arteriogram 5. Catheterization of the common hepatic artery with arteriogram 6. Catheterization of the gastroduodenal artery with arteriogram 7. Catheterization of the right gastroepiploic artery with arteriogram MEDICATIONS: None ANESTHESIA/SEDATION: Moderate (conscious) sedation was employed during this procedure. A total of Versed 2 mg and Fentanyl 100 mcg was administered intravenously. Moderate Sedation Time: 69 minutes. The patient's level of consciousness and vital signs were monitored continuously by radiology nursing throughout the procedure under my direct supervision. CONTRAST:  37mL OMNIPAQUE IOHEXOL 300 MG/ML SOLN, 38mL OMNIPAQUE IOHEXOL 300 MG/ML SOLN, 49mL OMNIPAQUE IOHEXOL 300 MG/ML SOLN FLUOROSCOPY TIME:  Fluoroscopy Time: 24 minutes 18 seconds (3,084 mGy). COMPLICATIONS: None immediate. PROCEDURE: Informed consent was obtained from the patient following explanation of the procedure, risks, benefits and alternatives. The patient understands, agrees and consents for the procedure. All questions were addressed. A time out was performed prior to the initiation of the procedure. Maximal barrier sterile technique utilized including caps, mask, sterile gowns,  sterile gloves, large sterile drape, hand hygiene, and Betadine prep. The right common femoral artery was interrogated with ultrasound and found to be widely patent. An image was obtained and stored for the medical record. Local anesthesia was attained by infiltration with 1% lidocaine. A small dermatotomy was made. Under real-time sonographic guidance, the vessel was punctured with a 21 gauge micropuncture needle. Using standard technique, the initial micro needle was exchanged over a 0.018 micro wire for a transitional 4 [place] micro sheath. The micro sheath was then exchanged over a 0.035 wire for a 5 French vascular sheath. A C2 cobra catheter was advanced over a Bentson wire into the abdominal aorta. The catheter was used to select the superior mesenteric artery. The findings are highly abnormal. There is significant hypertrophy and tortuosity of the pancreaticoduodenal arcade. Multiple of the vessels demonstrate an irregular beaded appearance with areas of focal narrowing and fusiform dilation. Flow passes directly through the pancreaticoduodenal arcade into the gastroduodenal artery and then into the gastric and hepatic vascular beds. 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 The middle colic artery is  highly abnormal. The artery is nearly occluded proximally and spasmed to a thread like caliber followed by focal dilation up to 2 mm. Extensive attempts were then made to catheterize the celiac artery first using a C2 cobra catheter then using a Sos Omni selective catheter. Ultimately, a Mickelson catheter was successfully engaged into the origin of the celiac artery. The origin is stenotic and nearly occlusive by the presence of the 5 French catheter. Arteriography was again performed demonstrating diffusely abnormal arterial structures. 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 No antegrade flow identified into the gastroduodenal or dorsal pancreatic arteries which were easily visualized on the SMA injection. This suggests that the direction of flow is from the SMA into the celiac territory. An attempt was made to position the [facility] catheter deeper into the celiac artery, however the vessels are extremely fragile and manipulation resulted in a focal non flow limiting dissection. The catheter was brought back and perched just within the origin. A renegade STC microcatheter was carefully advanced over a Fathom 16 wire. The microcatheter was advanced into the dorsal pancreatic artery. Arteriography was performed. Injection into the dorsal pancreatic artery was challenging given that inflow was coming from the SMA artery through the pancreaticoduodenal arcade. No evidence of focal hemorrhage or targetable segment for embolization. The microcatheter was brought back into the celiac artery and next advanced into the common hepatic artery. Arteriography was performed. 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 The origin of the gastroduodenal artery could not be visualized secondary to inflow from the pancreaticoduodenal arcade. Using landmarks, the microcatheter was successfully advanced into the vessel. Arteriography was performed. The vessel is mildly fusiform dilated. The microcatheter was further advanced beyond the gastroduodenal artery and into the next arterial bed. Arteriography was performed confirming that the catheter is within the gastroepiploic artery. Again, the artery is diffusely abnormal with multiple areas of focal narrowing and mild fusiform dilation. Numerous additional unnamed branch vessels are also present all of which appear abnormal. Given that there is no evidence of active extravasation or hemorrhage from any of the interrogated arteries and that the underlying arterial problem appears to be a  diffuse visceral vasculitis, there is no target for prophylactic embolization. As such, no further intervention will be performed. The catheters were removed. Hemostasis was attained with the assistance of a Cordis Exoseal extra arterial vascular plug. IMPRESSION: 1. 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 Affected arteries include the middle colic artery, the entire pancreaticoduodenal arcade, the celiac artery, the left gastric artery, the hepatic artery, the gastroduodenal artery, the right gastroepiploic artery and the dorsal pancreatic artery. 2. There is no evidence of active hemorrhage at this time. 3. Given the diffuse nature of the process, there is no focal area amenable to a meaningful prophylactic embolization. The entire visceral arterial tree is currently inflamed, irregular and at risk for spontaneous hemorrhage. PLAN: *Unfortunately, endovascular intervention, and very likely surgical intervention would not be of meaningful benefit. *Recommend continuing to trend H and H and transfuse as needed in addition to supportive care. Signed, Criselda Peaches, MD, [facility] Radiology Electronically Signed   By: Jacqulynn Cadet M.D.   On: 03/30/2019 09:20   IR Angiogram Visceral Selective  Result Date: 03/30/2019 INDICATION: 59 year old female with active COVID pneumonia and multifocal intra-abdominal hemorrhage is of uncertain etiology.  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 She presents for urgent angiogram and attempted embolization. EXAM: SELECTIVE VISCERAL ARTERIOGRAPHY; ADDITIONAL ARTERIOGRAPHY; IR ULTRASOUND GUIDANCE VASC ACCESS RIGHT 1. Ultrasound-guided arterial access 2. Catheterization of the superior mesenteric artery with arteriogram 3. Catheterization of the celiac artery with arteriogram 4. Catheterization of the dorsal  pancreatic artery with arteriogram 5. Catheterization of the common hepatic artery with arteriogram 6. Catheterization of the gastroduodenal artery with arteriogram 7. Catheterization of the right gastroepiploic artery with arteriogram MEDICATIONS: None ANESTHESIA/SEDATION: Moderate (conscious) sedation was employed during this procedure. A total of Versed 2 mg and Fentanyl 100 mcg was administered intravenously. Moderate Sedation Time: 69 minutes. The patient's level of consciousness and vital signs were monitored continuously by radiology nursing throughout the procedure under my direct supervision. CONTRAST:  49mL OMNIPAQUE IOHEXOL 300 MG/ML SOLN, 20mL OMNIPAQUE IOHEXOL 300 MG/ML SOLN, 72mL OMNIPAQUE IOHEXOL 300 MG/ML SOLN FLUOROSCOPY TIME:  Fluoroscopy Time: 24 minutes 18 seconds (3,084 mGy). COMPLICATIONS: None immediate. PROCEDURE: Informed consent was obtained from the patient following explanation of the procedure, risks, benefits and alternatives. The patient understands, agrees and consents for the procedure. All questions were addressed. A time out was performed prior to the initiation of the procedure. Maximal barrier sterile technique utilized including caps, mask, sterile gowns, sterile gloves, large sterile drape, hand hygiene, and Betadine prep. The right common femoral artery was interrogated with ultrasound and found to be widely patent. An image was obtained and stored for the medical record. Local anesthesia was attained by infiltration with 1% lidocaine. A small dermatotomy was made. Under real-time sonographic guidance, the vessel was punctured with a 21 gauge micropuncture needle. Using standard technique, the initial micro needle was exchanged over a 0.018 micro wire for a transitional 4 [place] micro sheath. The micro sheath was then exchanged over a 0.035 wire for a 5 French vascular sheath. A C2 cobra catheter was advanced over a Bentson wire into the abdominal aorta. The catheter was used  to select the superior mesenteric artery. The findings are highly abnormal. There is significant hypertrophy and tortuosity of the pancreaticoduodenal arcade. Multiple of the vessels demonstrate an irregular beaded appearance with areas of focal narrowing and fusiform dilation. Flow passes directly through the pancreaticoduodenal arcade into the gastroduodenal artery and then into the gastric and hepatic vascular beds. 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 The middle colic artery is highly abnormal. The artery is nearly occluded proximally and spasmed to a thread like caliber followed by focal dilation up to 2 mm. Extensive attempts were then made to catheterize the celiac artery first using a C2 cobra catheter then using a Sos Omni selective catheter. Ultimately, a Mickelson catheter was successfully engaged into the origin of the celiac artery. The origin is stenotic and nearly occlusive by the presence of the 5 French catheter. Arteriography was again performed demonstrating diffusely abnormal arterial structures. 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 No antegrade flow identified into the gastroduodenal or dorsal pancreatic arteries which were easily visualized on the SMA injection. This suggests that the direction of flow is from the SMA into the celiac territory. An attempt was made to position the [facility] catheter deeper into the celiac artery, however the vessels are extremely fragile and manipulation resulted in a focal non flow limiting dissection. The catheter was brought back and perched just within  the origin. A renegade STC microcatheter was carefully advanced over a Fathom 16 wire. The microcatheter was advanced into the dorsal pancreatic artery. Arteriography was performed. Injection into the dorsal pancreatic artery was challenging  given that inflow was coming from the SMA artery through the pancreaticoduodenal arcade. No evidence of focal hemorrhage or targetable segment for embolization. The microcatheter was brought back into the celiac artery and next advanced into the common hepatic artery. Arteriography was performed. 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 The origin of the gastroduodenal artery could not be visualized secondary to inflow from the pancreaticoduodenal arcade. Using landmarks, the microcatheter was successfully advanced into the vessel. Arteriography was performed. The vessel is mildly fusiform dilated. The microcatheter was further advanced beyond the gastroduodenal artery and into the next arterial bed. Arteriography was performed confirming that the catheter is within the gastroepiploic artery. Again, the artery is diffusely abnormal with multiple areas of focal narrowing and mild fusiform dilation. Numerous additional unnamed branch vessels are also present all of which appear abnormal. 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 As such, no further intervention will be performed. The catheters were removed. Hemostasis was attained with the assistance of a Cordis Exoseal extra arterial vascular plug. IMPRESSION: 1. 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 Affected arteries include the middle colic artery, the entire pancreaticoduodenal arcade, the celiac artery, the left gastric artery, the hepatic artery, the gastroduodenal artery, the right gastroepiploic artery and the dorsal pancreatic artery. 2. There is no evidence of active hemorrhage at this time. 3. Given the diffuse nature of the process, there is no focal area amenable  to a meaningful prophylactic embolization. The entire visceral arterial tree is currently inflamed, irregular and at risk for spontaneous hemorrhage. PLAN: *Unfortunately, endovascular intervention, and very likely surgical intervention would not be of meaningful benefit. *Recommend continuing to trend H and H and transfuse as needed in addition to supportive care. Signed, Criselda Peaches, MD, [place] [facility] Radiology Electronically Signed   By: Jacqulynn Cadet M.D.   On: 03/30/2019 09:20   IR Angiogram Selective Each Additional Vessel  Result Date: 03/30/2019 INDICATION: 59 year old female with active COVID pneumonia and multifocal intra-abdominal hemorrhage is of uncertain etiology. 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 She presents for urgent angiogram and attempted embolization. EXAM: SELECTIVE VISCERAL ARTERIOGRAPHY; ADDITIONAL ARTERIOGRAPHY; IR ULTRASOUND GUIDANCE VASC ACCESS RIGHT 1. Ultrasound-guided arterial access 2. Catheterization of the superior mesenteric artery with arteriogram 3. Catheterization of the celiac artery with arteriogram 4. Catheterization of the dorsal pancreatic artery with arteriogram 5. Catheterization of the common hepatic artery with arteriogram 6. Catheterization of the gastroduodenal artery with arteriogram 7. Catheterization of the right gastroepiploic artery with arteriogram MEDICATIONS: None ANESTHESIA/SEDATION: Moderate (conscious) sedation was employed during this procedure. A total of Versed 2 mg and Fentanyl 100 mcg was administered intravenously. Moderate Sedation Time: 69 minutes. The patient's level of consciousness and vital signs were monitored continuously by radiology nursing throughout the procedure under my direct supervision. CONTRAST:  30mL OMNIPAQUE IOHEXOL 300 MG/ML SOLN, 49mL OMNIPAQUE IOHEXOL 300 MG/ML SOLN, 46mL OMNIPAQUE IOHEXOL 300 MG/ML SOLN  FLUOROSCOPY TIME:  Fluoroscopy Time: 24 minutes 18 seconds (3,084 mGy). COMPLICATIONS: None immediate. PROCEDURE: Informed consent was  obtained from the patient following explanation of the procedure, risks, benefits and alternatives. The patient understands, agrees and consents for the procedure. All questions were addressed. A time out was performed prior to the initiation of the procedure. Maximal barrier sterile technique utilized including caps, mask, sterile gowns, sterile gloves, large sterile drape, hand hygiene, and Betadine prep. The right common femoral artery was interrogated with ultrasound and found to be widely patent. An image was obtained and stored for the medical record. Local anesthesia was attained by infiltration with 1% lidocaine. A small dermatotomy was made. Under real-time sonographic guidance, the vessel was punctured with a 21 gauge micropuncture needle. Using standard technique, the initial micro needle was exchanged over a 0.018 micro wire for a transitional 4 [place] micro sheath. The micro sheath was then exchanged over a 0.035 wire for a 5 French vascular sheath. A C2 cobra catheter was advanced over a Bentson wire into the abdominal aorta. The catheter was used to select the superior mesenteric artery. The findings are highly abnormal. There is significant hypertrophy and tortuosity of the pancreaticoduodenal arcade. Multiple of the vessels demonstrate an irregular beaded appearance with areas of focal narrowing and fusiform dilation. Flow passes directly through the pancreaticoduodenal arcade into the gastroduodenal artery and then into the gastric and hepatic vascular beds. 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 The middle colic artery is highly abnormal. The artery is nearly occluded proximally and spasmed to a thread like caliber followed by focal dilation up to 2 mm. Extensive attempts were then made to catheterize the celiac artery  first using a C2 cobra catheter then using a Sos Omni selective catheter. Ultimately, a Mickelson catheter was successfully engaged into the origin of the celiac artery. The origin is stenotic and nearly occlusive by the presence of the 5 French catheter. Arteriography was again performed demonstrating diffusely abnormal arterial structures. 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 No antegrade flow identified into the gastroduodenal or dorsal pancreatic arteries which were easily visualized on the SMA injection. This suggests that the direction of flow is from the SMA into the celiac territory. An attempt was made to position the [facility] catheter deeper into the celiac artery, however the vessels are extremely fragile and manipulation resulted in a focal non flow limiting dissection. The catheter was brought back and perched just within the origin. A renegade STC microcatheter was carefully advanced over a Fathom 16 wire. The microcatheter was advanced into the dorsal pancreatic artery. Arteriography was performed. Injection into the dorsal pancreatic artery was challenging given that inflow was coming from the SMA artery through the pancreaticoduodenal arcade. No evidence of focal hemorrhage or targetable segment for embolization. The microcatheter was brought back into the celiac artery and next advanced into the common hepatic artery. Arteriography was performed. 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 The origin of the gastroduodenal artery could not be visualized secondary to inflow from the pancreaticoduodenal arcade. Using landmarks, the microcatheter was successfully advanced into the vessel. Arteriography was performed. The vessel is mildly fusiform dilated. The microcatheter was further advanced beyond the gastroduodenal artery and into the next  arterial bed. Arteriography was performed confirming that the catheter is within the gastroepiploic artery. Again, the artery is diffusely abnormal with multiple areas of focal narrowing and mild fusiform dilation. Numerous additional unnamed branch vessels are also present all  of which appear abnormal. 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 As such, no further intervention will be performed. The catheters were removed. Hemostasis was attained with the assistance of a Cordis Exoseal extra arterial vascular plug. IMPRESSION: 1. 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 Affected arteries include the middle colic artery, the entire pancreaticoduodenal arcade, the celiac artery, the left gastric artery, the hepatic artery, the gastroduodenal artery, the right gastroepiploic artery and the dorsal pancreatic artery. 2. There is no evidence of active hemorrhage at this time. 3. Given the diffuse nature of the process, there is no focal area amenable to a meaningful prophylactic embolization. The entire visceral arterial tree is currently inflamed, irregular and at risk for spontaneous hemorrhage. PLAN: *Unfortunately, endovascular intervention, and very likely surgical intervention would not be of meaningful benefit. *Recommend continuing to trend H and H and transfuse as needed in addition to supportive care. Signed, Criselda Peaches, MD, [place] [facility] Radiology Electronically Signed   By: Jacqulynn Cadet M.D.   On: 03/30/2019 09:20   IR Angiogram Selective Each Additional Vessel  Result Date: 03/30/2019 INDICATION: 59 year old female with active COVID pneumonia and multifocal intra-abdominal hemorrhage is of  uncertain etiology. 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 She presents for urgent angiogram and attempted embolization. EXAM: SELECTIVE VISCERAL ARTERIOGRAPHY; ADDITIONAL ARTERIOGRAPHY; IR ULTRASOUND GUIDANCE VASC ACCESS RIGHT 1. Ultrasound-guided arterial access 2. Catheterization of the superior mesenteric artery with arteriogram 3. Catheterization of the celiac artery with arteriogram 4. Catheterization of the dorsal pancreatic artery with arteriogram 5. Catheterization of the common hepatic artery with arteriogram 6. Catheterization of the gastroduodenal artery with arteriogram 7. Catheterization of the right gastroepiploic artery with arteriogram MEDICATIONS: None ANESTHESIA/SEDATION: Moderate (conscious) sedation was employed during this procedure. A total of Versed 2 mg and Fentanyl 100 mcg was administered intravenously. Moderate Sedation Time: 69 minutes. The patient's level of consciousness and vital signs were monitored continuously by radiology nursing throughout the procedure under my direct supervision. CONTRAST:  72mL OMNIPAQUE IOHEXOL 300 MG/ML SOLN, 32mL OMNIPAQUE IOHEXOL 300 MG/ML SOLN, 55mL OMNIPAQUE IOHEXOL 300 MG/ML SOLN FLUOROSCOPY TIME:  Fluoroscopy Time: 24 minutes 18 seconds (3,084 mGy). COMPLICATIONS: None immediate. PROCEDURE: Informed consent was obtained from the patient following explanation of the procedure, risks, benefits and alternatives. The patient understands, agrees and consents for the procedure. All questions were addressed. A time out was performed prior to the initiation of the procedure. Maximal barrier sterile technique utilized including caps, mask, sterile gowns, sterile gloves, large sterile drape, hand hygiene, and Betadine prep. The right common femoral artery was interrogated with ultrasound and found to be widely patent. An image was obtained and stored for the medical record. Local anesthesia was attained by  infiltration with 1% lidocaine. A small dermatotomy was made. Under real-time sonographic guidance, the vessel was punctured with a 21 gauge micropuncture needle. Using standard technique, the initial micro needle was exchanged over a 0.018 micro wire for a transitional 4 [place] micro sheath. The micro sheath was then exchanged over a 0.035 wire for a 5 French vascular sheath. A C2 cobra catheter was advanced over a Bentson wire into the abdominal aorta. The catheter was used to select the superior mesenteric artery. The findings are highly abnormal.  There is significant hypertrophy and tortuosity of the pancreaticoduodenal arcade. Multiple of the vessels demonstrate an irregular beaded appearance with areas of focal narrowing and fusiform dilation. Flow passes directly through the pancreaticoduodenal arcade into the gastroduodenal artery and then into the gastric and hepatic vascular beds. 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 The middle colic artery is highly abnormal. The artery is nearly occluded proximally and spasmed to a thread like caliber followed by focal dilation up to 2 mm. Extensive attempts were then made to catheterize the celiac artery first using a C2 cobra catheter then using a Sos Omni selective catheter. Ultimately, a Mickelson catheter was successfully engaged into the origin of the celiac artery. The origin is stenotic and nearly occlusive by the presence of the 5 French catheter. Arteriography was again performed demonstrating diffusely abnormal arterial structures. 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 No antegrade flow identified into the gastroduodenal or dorsal pancreatic arteries which were easily visualized on the SMA injection. This suggests that the direction of flow is from the SMA into the celiac territory. An attempt  was made to position the [facility] catheter deeper into the celiac artery, however the vessels are extremely fragile and manipulation resulted in a focal non flow limiting dissection. The catheter was brought back and perched just within the origin. A renegade STC microcatheter was carefully advanced over a Fathom 16 wire. The microcatheter was advanced into the dorsal pancreatic artery. Arteriography was performed. Injection into the dorsal pancreatic artery was challenging given that inflow was coming from the SMA artery through the pancreaticoduodenal arcade. No evidence of focal hemorrhage or targetable segment for embolization. The microcatheter was brought back into the celiac artery and next advanced into the common hepatic artery. Arteriography was performed. 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 The origin of the gastroduodenal artery could not be visualized secondary to inflow from the pancreaticoduodenal arcade. Using landmarks, the microcatheter was successfully advanced into the vessel. Arteriography was performed. The vessel is mildly fusiform dilated. The microcatheter was further advanced beyond the gastroduodenal artery and into the next arterial bed. Arteriography was performed confirming that the catheter is within the gastroepiploic artery. Again, the artery is diffusely abnormal with multiple areas of focal narrowing and mild fusiform dilation. Numerous additional unnamed branch vessels are also present all of which appear abnormal. 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 As such, no further intervention will be performed. The catheters were removed. Hemostasis was attained with the assistance of a Cordis Exoseal extra arterial vascular plug.  IMPRESSION: 1. 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 Affected arteries include the middle colic artery, the entire pancreaticoduodenal arcade, the celiac artery, the left gastric artery, the hepatic artery, the gastroduodenal artery, the right gastroepiploic artery and the dorsal pancreatic artery. 2. There is no evidence of active hemorrhage at this time. 3. Given the diffuse nature of the process, there is no focal area amenable to a meaningful prophylactic embolization. The entire visceral arterial tree is currently inflamed, irregular and at risk for spontaneous hemorrhage. PLAN: *Unfortunately, endovascular intervention, and very likely surgical intervention would not be of meaningful benefit. *Recommend continuing  to trend H and H and transfuse as needed in addition to supportive care. Signed, Criselda Peaches, MD, [place] [facility] Radiology Electronically Signed   By: Jacqulynn Cadet M.D.   On: 03/30/2019 09:20   IR Angiogram Selective Each Additional Vessel  Result Date: 03/30/2019 INDICATION: 59 year old female with active COVID pneumonia and multifocal intra-abdominal hemorrhage is of uncertain etiology. 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 She presents for urgent angiogram and attempted embolization. EXAM: SELECTIVE VISCERAL ARTERIOGRAPHY; ADDITIONAL ARTERIOGRAPHY; IR ULTRASOUND GUIDANCE VASC ACCESS RIGHT 1. Ultrasound-guided arterial access 2. Catheterization of the superior mesenteric artery with arteriogram 3. Catheterization of the celiac artery with arteriogram 4. Catheterization of the dorsal pancreatic artery with arteriogram 5. Catheterization of the common hepatic artery with arteriogram 6. Catheterization of the gastroduodenal artery with arteriogram 7. Catheterization of the right gastroepiploic artery with arteriogram MEDICATIONS:  None ANESTHESIA/SEDATION: Moderate (conscious) sedation was employed during this procedure. A total of Versed 2 mg and Fentanyl 100 mcg was administered intravenously. Moderate Sedation Time: 69 minutes. The patient's level of consciousness and vital signs were monitored continuously by radiology nursing throughout the procedure under my direct supervision. CONTRAST:  30mL OMNIPAQUE IOHEXOL 300 MG/ML SOLN, 31mL OMNIPAQUE IOHEXOL 300 MG/ML SOLN, 43mL OMNIPAQUE IOHEXOL 300 MG/ML SOLN FLUOROSCOPY TIME:  Fluoroscopy Time: 24 minutes 18 seconds (3,084 mGy). COMPLICATIONS: None immediate. PROCEDURE: Informed consent was obtained from the patient following explanation of the procedure, risks, benefits and alternatives. The patient understands, agrees and consents for the procedure. All questions were addressed. A time out was performed prior to the initiation of the procedure. Maximal barrier sterile technique utilized including caps, mask, sterile gowns, sterile gloves, large sterile drape, hand hygiene, and Betadine prep. The right common femoral artery was interrogated with ultrasound and found to be widely patent. An image was obtained and stored for the medical record. Local anesthesia was attained by infiltration with 1% lidocaine. A small dermatotomy was made. Under real-time sonographic guidance, the vessel was punctured with a 21 gauge micropuncture needle. Using standard technique, the initial micro needle was exchanged over a 0.018 micro wire for a transitional 4 [place] micro sheath. The micro sheath was then exchanged over a 0.035 wire for a 5 French vascular sheath. A C2 cobra catheter was advanced over a Bentson wire into the abdominal aorta. The catheter was used to select the superior mesenteric artery. The findings are highly abnormal. There is significant hypertrophy and tortuosity of the pancreaticoduodenal arcade. Multiple of the vessels demonstrate an irregular beaded appearance with areas of focal  narrowing and fusiform dilation. Flow passes directly through the pancreaticoduodenal arcade into the gastroduodenal artery and then into the gastric and hepatic vascular beds. 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 The middle colic artery is highly abnormal. The artery is nearly occluded proximally and spasmed to a thread like caliber followed by focal dilation up to 2 mm. Extensive attempts were then made to catheterize the celiac artery first using a C2 cobra catheter then using a Sos Omni selective catheter. Ultimately, a Mickelson catheter was successfully engaged into the origin of the celiac artery. The origin is stenotic and nearly occlusive by the presence of the 5 French catheter. Arteriography was again performed demonstrating diffusely abnormal arterial structures. 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 No antegrade flow identified into  the gastroduodenal or dorsal pancreatic arteries which were easily visualized on the SMA injection. This suggests that the direction of flow is from the SMA into the celiac territory. An attempt was made to position the [facility] catheter deeper into the celiac artery, however the vessels are extremely fragile and manipulation resulted in a focal non flow limiting dissection. The catheter was brought back and perched just within the origin. A renegade STC microcatheter was carefully advanced over a Fathom 16 wire. The microcatheter was advanced into the dorsal pancreatic artery. Arteriography was performed. Injection into the dorsal pancreatic artery was challenging given that inflow was coming from the SMA artery through the pancreaticoduodenal arcade. No evidence of focal hemorrhage or targetable segment for embolization. The microcatheter was brought back into the celiac artery and next advanced into the  common hepatic artery. Arteriography was performed. 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 The origin of the gastroduodenal artery could not be visualized secondary to inflow from the pancreaticoduodenal arcade. Using landmarks, the microcatheter was successfully advanced into the vessel. Arteriography was performed. The vessel is mildly fusiform dilated. The microcatheter was further advanced beyond the gastroduodenal artery and into the next arterial bed. Arteriography was performed confirming that the catheter is within the gastroepiploic artery. Again, the artery is diffusely abnormal with multiple areas of focal narrowing and mild fusiform dilation. Numerous additional unnamed branch vessels are also present all of which appear abnormal. 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 As such, no further intervention will be performed. The catheters were removed. Hemostasis was attained with the assistance of a Cordis Exoseal extra arterial vascular plug. IMPRESSION: 1. 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 Affected arteries include the middle colic artery, the entire pancreaticoduodenal arcade, the celiac artery, the left gastric artery, the hepatic artery, the gastroduodenal artery, the right gastroepiploic artery and the dorsal pancreatic artery. 2. There is no evidence of active hemorrhage at this time. 3. Given the diffuse nature of the process, there is no focal area amenable to a meaningful prophylactic embolization. The entire visceral arterial tree is currently inflamed, irregular and at risk for spontaneous hemorrhage. PLAN: *Unfortunately, endovascular intervention, and very likely surgical intervention would not  be of meaningful benefit. *Recommend continuing to trend H and H and transfuse as needed in addition to supportive care. Signed, Criselda Peaches, MD, [place] [facility] Radiology Electronically Signed   By: Jacqulynn Cadet M.D.   On: 03/30/2019 09:20   IR Angiogram Selective Each Additional Vessel  Result Date: 03/30/2019 INDICATION: 59 year old female with active COVID pneumonia and multifocal intra-abdominal hemorrhage is of uncertain etiology. 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 She presents for urgent angiogram and attempted embolization. EXAM: SELECTIVE VISCERAL ARTERIOGRAPHY; ADDITIONAL ARTERIOGRAPHY; IR ULTRASOUND GUIDANCE VASC ACCESS RIGHT 1. Ultrasound-guided arterial access 2. Catheterization of the superior mesenteric artery with arteriogram 3. Catheterization of the celiac artery with arteriogram 4. Catheterization of the dorsal pancreatic artery with arteriogram 5. Catheterization of the common hepatic artery with arteriogram 6. Catheterization of the gastroduodenal artery with arteriogram 7. Catheterization of the right gastroepiploic artery with arteriogram MEDICATIONS: None ANESTHESIA/SEDATION: Moderate (conscious) sedation was employed during this procedure. A total of Versed 2 mg and Fentanyl 100 mcg  was administered intravenously. Moderate Sedation Time: 69 minutes. The patient's level of consciousness and vital signs were monitored continuously by radiology nursing throughout the procedure under my direct supervision. CONTRAST:  1mL OMNIPAQUE IOHEXOL 300 MG/ML SOLN, 57mL OMNIPAQUE IOHEXOL 300 MG/ML SOLN, 78mL OMNIPAQUE IOHEXOL 300 MG/ML SOLN FLUOROSCOPY TIME:  Fluoroscopy Time: 24 minutes 18 seconds (3,084 mGy). COMPLICATIONS: None immediate. PROCEDURE: Informed consent was obtained from the patient following explanation of the procedure, risks, benefits and alternatives. The patient  understands, agrees and consents for the procedure. All questions were addressed. A time out was performed prior to the initiation of the procedure. Maximal barrier sterile technique utilized including caps, mask, sterile gowns, sterile gloves, large sterile drape, hand hygiene, and Betadine prep. The right common femoral artery was interrogated with ultrasound and found to be widely patent. An image was obtained and stored for the medical record. Local anesthesia was attained by infiltration with 1% lidocaine. A small dermatotomy was made. Under real-time sonographic guidance, the vessel was punctured with a 21 gauge micropuncture needle. Using standard technique, the initial micro needle was exchanged over a 0.018 micro wire for a transitional 4 [place] micro sheath. The micro sheath was then exchanged over a 0.035 wire for a 5 French vascular sheath. A C2 cobra catheter was advanced over a Bentson wire into the abdominal aorta. The catheter was used to select the superior mesenteric artery. The findings are highly abnormal. There is significant hypertrophy and tortuosity of the pancreaticoduodenal arcade. Multiple of the vessels demonstrate an irregular beaded appearance with areas of focal narrowing and fusiform dilation. Flow passes directly through the pancreaticoduodenal arcade into the gastroduodenal artery and then into the gastric and hepatic vascular beds. 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 The middle colic artery is highly abnormal. The artery is nearly occluded proximally and spasmed to a thread like caliber followed by focal dilation up to 2 mm. Extensive attempts were then made to catheterize the celiac artery first using a C2 cobra catheter then using a Sos Omni selective catheter. Ultimately, a Mickelson catheter was successfully engaged into the origin of the celiac artery. The origin is stenotic and nearly occlusive by the presence of the 5 French  catheter. Arteriography was again performed demonstrating diffusely abnormal arterial structures. 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 No antegrade flow identified into the gastroduodenal or dorsal pancreatic arteries which were easily visualized on the SMA injection. This suggests that the direction of flow is from the SMA into the celiac territory. An attempt was made to position the [facility] catheter deeper into the celiac artery, however the vessels are extremely fragile and manipulation resulted in a focal non flow limiting dissection. The catheter was brought back and perched just within the origin. A renegade STC microcatheter was carefully advanced over a Fathom 16 wire. The microcatheter was advanced into the dorsal pancreatic artery. Arteriography was performed. Injection into the dorsal pancreatic artery was challenging given that inflow was coming from the SMA artery through the pancreaticoduodenal arcade. No evidence of focal hemorrhage or targetable segment for embolization. The microcatheter was brought back into the celiac artery and next advanced into the common hepatic artery. Arteriography was performed. 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 The origin of the gastroduodenal artery could not be visualized secondary to inflow from the pancreaticoduodenal arcade. Using landmarks, the microcatheter  was successfully advanced into the vessel. Arteriography was performed. The vessel is mildly fusiform dilated. The microcatheter was further advanced beyond the gastroduodenal artery and into the next arterial bed. Arteriography was performed confirming that the catheter is within the gastroepiploic artery. Again, the artery is diffusely abnormal with multiple areas of focal narrowing and mild fusiform dilation. Numerous additional unnamed  branch vessels are also present all of which appear abnormal. 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 As such, no further intervention will be performed. The catheters were removed. Hemostasis was attained with the assistance of a Cordis Exoseal extra arterial vascular plug. IMPRESSION: 1. 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 Affected arteries include the middle colic artery, the entire pancreaticoduodenal arcade, the celiac artery, the left gastric artery, the hepatic artery, the gastroduodenal artery, the right gastroepiploic artery and the dorsal pancreatic artery. 2. There is no evidence of active hemorrhage at this time. 3. Given the diffuse nature of the process, there is no focal area amenable to a meaningful prophylactic embolization. The entire visceral arterial tree is currently inflamed, irregular and at risk for spontaneous hemorrhage. PLAN: *Unfortunately, endovascular intervention, and very likely surgical intervention would not be of meaningful benefit. *Recommend continuing to trend H and H and transfuse as needed in addition to supportive care. Signed, Criselda Peaches, MD, [place] [facility] Radiology Electronically Signed   By: Jacqulynn Cadet M.D.   On: 03/30/2019 09:20   IR US Guide Vasc Access Right  Result Date: 03/30/2019 INDICATION: 59 year old female with active COVID pneumonia and multifocal intra-abdominal hemorrhage is of uncertain etiology. 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 She presents for urgent angiogram and attempted embolization. EXAM: SELECTIVE VISCERAL ARTERIOGRAPHY; ADDITIONAL ARTERIOGRAPHY; IR ULTRASOUND GUIDANCE  VASC ACCESS RIGHT 1. Ultrasound-guided arterial access 2. Catheterization of the superior mesenteric artery with arteriogram 3. Catheterization of the celiac artery with arteriogram 4. Catheterization of the dorsal pancreatic artery with arteriogram 5. Catheterization of the common hepatic artery with arteriogram 6. Catheterization of the gastroduodenal artery with arteriogram 7. Catheterization of the right gastroepiploic artery with arteriogram MEDICATIONS: None ANESTHESIA/SEDATION: Moderate (conscious) sedation was employed during this procedure. A total of Versed 2 mg and Fentanyl 100 mcg was administered intravenously. Moderate Sedation Time: 69 minutes. The patient's level of consciousness and vital signs were monitored continuously by radiology nursing throughout the procedure under my direct supervision. CONTRAST:  7mL OMNIPAQUE IOHEXOL 300 MG/ML SOLN, 51mL OMNIPAQUE IOHEXOL 300 MG/ML SOLN, 41mL OMNIPAQUE IOHEXOL 300 MG/ML SOLN FLUOROSCOPY TIME:  Fluoroscopy Time: 24 minutes 18 seconds (3,084 mGy). COMPLICATIONS: None immediate. PROCEDURE: Informed consent was obtained from the patient following explanation of the procedure, risks, benefits and alternatives. The patient understands, agrees and consents for the procedure. All questions were addressed. A time out was performed prior to the initiation of the procedure. Maximal barrier sterile technique utilized including caps, mask, sterile gowns, sterile gloves, large sterile drape, hand hygiene, and Betadine prep. The right common femoral artery was interrogated with ultrasound and found to be widely patent. An image was obtained and stored for the medical record. Local anesthesia was attained by infiltration with 1% lidocaine. A small dermatotomy was made. Under real-time sonographic guidance, the vessel was punctured with a 21 gauge micropuncture  needle. Using standard technique, the initial micro needle was exchanged over a 0.018 micro wire for a  transitional 4 [place] micro sheath. The micro sheath was then exchanged over a 0.035 wire for a 5 French vascular sheath. A C2 cobra catheter was advanced over a Bentson wire into the abdominal aorta. The catheter was used to select the superior mesenteric artery. The findings are highly abnormal. There is significant hypertrophy and tortuosity of the pancreaticoduodenal arcade. Multiple of the vessels demonstrate an irregular beaded appearance with areas of focal narrowing and fusiform dilation. Flow passes directly through the pancreaticoduodenal arcade into the gastroduodenal artery and then into the gastric and hepatic vascular beds. 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 The middle colic artery is highly abnormal. The artery is nearly occluded proximally and spasmed to a thread like caliber followed by focal dilation up to 2 mm. Extensive attempts were then made to catheterize the celiac artery first using a C2 cobra catheter then using a Sos Omni selective catheter. Ultimately, a Mickelson catheter was successfully engaged into the origin of the celiac artery. The origin is stenotic and nearly occlusive by the presence of the 5 French catheter. Arteriography was again performed demonstrating diffusely abnormal arterial structures. 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 No antegrade flow identified into the gastroduodenal or dorsal pancreatic arteries which were easily visualized on the SMA injection. This suggests that the direction of flow is from the SMA into the celiac territory. An attempt was made to position the [facility] catheter deeper into the celiac artery, however the vessels are extremely fragile and manipulation resulted in a focal non flow limiting dissection. The catheter was brought back and perched just within the origin. A  renegade STC microcatheter was carefully advanced over a Fathom 16 wire. The microcatheter was advanced into the dorsal pancreatic artery. Arteriography was performed. Injection into the dorsal pancreatic artery was challenging given that inflow was coming from the SMA artery through the pancreaticoduodenal arcade. No evidence of focal hemorrhage or targetable segment for embolization. The microcatheter was brought back into the celiac artery and next advanced into the common hepatic artery. Arteriography was performed. 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 The origin of the gastroduodenal artery could not be visualized secondary to inflow from the pancreaticoduodenal arcade. Using landmarks, the microcatheter was successfully advanced into the vessel. Arteriography was performed. The vessel is mildly fusiform dilated. The microcatheter was further advanced beyond the gastroduodenal artery and into the next arterial bed. Arteriography was performed confirming that the catheter is within the gastroepiploic artery. Again, the artery is diffusely abnormal with multiple areas of focal narrowing and mild fusiform dilation. Numerous additional unnamed branch vessels are also present all of which appear abnormal. 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 As such, no further intervention will be performed. The catheters were removed. Hemostasis was attained with the assistance of a Cordis Exoseal extra arterial vascular plug. IMPRESSION: 1. 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 Affected arteries include the middle colic artery, the entire pancreaticoduodenal arcade, the celiac artery, the left gastric artery, the hepatic artery,  the gastroduodenal artery, the right gastroepiploic  artery and the dorsal pancreatic artery. 2. There is no evidence of active hemorrhage at this time. 3. Given the diffuse nature of the process, there is no focal area amenable to a meaningful prophylactic embolization. The entire visceral arterial tree is currently inflamed, irregular and at risk for spontaneous hemorrhage. PLAN: *Unfortunately, endovascular intervention, and very likely surgical intervention would not be of meaningful benefit. *Recommend continuing to trend H and H and transfuse as needed in addition to supportive care. Signed, Criselda Peaches, MD, [place] [facility] Radiology Electronically Signed   By: Jacqulynn Cadet M.D.   On: 03/30/2019 09:20   DG CHEST PORT 1 VIEW  Result Date: 03/29/2019 CLINICAL DATA:  Central line placement. EXAM: PORTABLE CHEST 1 VIEW COMPARISON:  March 24, 2019. FINDINGS: Stable cardiomediastinal silhouette. Feeding tube is seen with distal tip in expected position of stomach. Right internal jugular catheter is noted with distal tip in expected position of the SVC. Hypoinflation of the lungs is noted. No pneumothorax or pleural effusion is noted. Lungs are clear. Bony thorax is unremarkable. IMPRESSION: Right internal jugular catheter is noted with tip in expected position of the SVC. Distal tip of feeding tube is seen within the stomach. Hypoinflation of the lungs is noted. Electronically Signed   By: Marijo Conception M.D.   On: 03/29/2019 12:42   DG CHEST PORT 1 VIEW  Result Date: 03/24/2019 CLINICAL DATA:  Acute respiratory failure with hypoxia. EXAM: PORTABLE CHEST 1 VIEW COMPARISON:  March 21, 2019. FINDINGS: Stable cardiomediastinal silhouette. Endotracheal and nasogastric tubes are unchanged in position. No pneumothorax or pleural effusion is noted. Right lung is clear. Mild left basilar atelectasis or infiltrate is noted. Bony thorax is  unremarkable. IMPRESSION: Stable support apparatus. Stable left basilar opacity as described above. Electronically Signed   By: Marijo Conception M.D.   On: 03/24/2019 12:28   DG Abd Portable 1V  Result Date: 03/27/2019 CLINICAL DATA:  Check gastric catheter placement EXAM: PORTABLE ABDOMEN - 1 VIEW COMPARISON:  None. FINDINGS: Feeding catheter is noted coiled within the gastric bubble. No obstructive changes are seen. No acute bony abnormality is noted. IMPRESSION: Feeding catheter coiled within the stomach. Electronically Signed   By: Inez Catalina M.D.   On: 03/27/2019 12:27   CT Angio Abd/Pel w/ and/or w/o  Result Date: 03/29/2019 CLINICAL DATA:  59 year old female with a history abdominal hemorrhage EXAM: CTA ABDOMEN AND PELVIS WITHOUT AND WITH CONTRAST TECHNIQUE: Multidetector CT imaging of the abdomen and pelvis was performed using the standard protocol during bolus administration of intravenous contrast. Multiplanar reconstructed images and MIPs were obtained and reviewed to evaluate the vascular anatomy. CONTRAST:  150mL OMNIPAQUE IOHEXOL 350 MG/ML SOLN COMPARISON:  03/28/2019 FINDINGS: VASCULAR Aorta: Unremarkable course, caliber, contour of the abdominal aorta. No dissection, aneurysm, or periaortic fluid. Celiac: No significant atherosclerotic changes at the celiac artery origin. Small caliber celiac artery. Small caliber left gastric artery splenic artery hepatic artery. The arterial phase series 4 demonstrates linear hyperdensity in the distribution the GDA/pancreaticoduodenal arteries on image 44. This does persist in the delayed phase images, potentially representing extravasation. All of the arteries in the distribution of the GDA are small caliber compatible with vasospasm. The splenic artery is small caliber compatible with vaso spasm. SMA: No significant atherosclerotic changes at the SMA origin. SMA branches are small caliber. Renals: No significant atherosclerotic changes at the origin  renal arteries which remain patent. IMA: Inferior mesenteric artery is patent. Right lower extremity:  Unremarkable course, caliber, and contour of the right iliac system. No aneurysm, dissection, or occlusion. Hypogastric artery is patent. Common femoral artery patent. Proximal SFA and profunda femoris patent. Left lower extremity: Unremarkable course, caliber, and contour of the left iliac system. No aneurysm, dissection, or occlusion. Hypogastric artery is patent. Common femoral artery patent. Proximal SFA and profunda femoris patent. Veins: Unremarkable appearance of the venous system. Review of the MIP images confirms the above findings. NON-VASCULAR Lower chest: Atelectasis at the lung bases. Hepatobiliary: Unremarkable appearance of the liver. Hyperdense material layered in the dependent aspect of the gallbladder. No pericholecystic inflammatory changes. Pancreas: Pancreatic tail body unremarkable. Evaluation the pancreatic head somewhat limited by the presence of hematoma/stranding. Spleen: The spleen diameter within normal limits, best seen on the delayed images adjacent to the diaphragm. Adrenals/Urinary Tract: Unremarkable appearance of the adrenal glands. Right: No hydronephrosis. Symmetric perfusion to the left. No nephrolithiasis. Unremarkable course of the right ureter. Hypodense lesion on the lateral cortex of the right kidney incompletely characterized. Left: No hydronephrosis. Symmetric perfusion to the right. No nephrolithiasis. Unremarkable course of the left ureter. Hypodense cystic lesions associated with the left kidney at the anterior cortex and the medial cortex, most likely cysts. Balloon retention urinary catheter. Stomach/Bowel: Enteric feeding tube terminates within the stomach. Small bowel decompressed, with enhancement maintained of the small bowel wall. Appendix is not visualized, however, no inflammatory changes are present adjacent to the cecum to indicate an appendicitis. No  significant stool burden. Colonic diverticula. The splenic flexure is not visualized given the presence of hematoma. The colon wall at this site is not well evaluated. Lymphatic: No adenopathy. Mesenteric: Redemonstration left upper quadrant hemorrhage, centered in the mesenteric fat, inseparable from the splenic flexure. The greatest component of the hemorrhage measures 9 point 4 cm x 5.8 cm, relatively similar in configuration to the comparison. Hemorrhage extends inferiorly into the fat of the left-sided mesentery. Redemonstration of pelvic hematoma. Relatively hyperdense tissue/hematoma overlying the uterus. Trace volume of free fluid within the small bowel loops. Free fluid within the right upper quadrant adjacent to the liver, measuring 25 Hounsfield units. Redemonstration of intermediate density tissue/fluid at the pancreatic head, inseparable from the first portion the duodenum. Hyperdense linear streak at the inferior aspect, image 44 of series 4, not present on the delayed images. Reproductive: Fibroid uterus. Other: No hernia. Musculoskeletal: Degenerative changes of the spine. No acute displaced fracture IMPRESSION: Multifocal mesenteric hemorrhage again demonstrated, with free intraperitoneal hemorrhage, including the mesenteric fat of the left upper quadrant adjacent to the splenic flexure, as well hemorrhage centered at the pancreatic head. Questionable extravasation of contrast in the region the pancreatic head hematoma, potentially arising from branches of the GDA and/or pancreaticoduodenal arcade. No extravasation of contrast identified within the left upper quadrant, with no source identified in the left upper quadrant. Evidence of diffuse vasospasm. Given the presence of hematoma and edema/stranding near the pancreatic head, acute pancreatitis cannot be excluded. Preliminary results were discussed by telephone at the time of interpretation on 03/29/2019 at 3:35 pm to [place], Signed,  Dulcy Fanny. Dellia Nims, RPVI [facility] Radiology Electronically Signed   By: Corrie Mckusick D.O.   On: 03/29/2019 15:37      Subjective: "Let my bed down..."  Discharge Exam: Vitals:   04/20/19 2059 04/21/19 0416  BP: 129/84 125/78  Pulse: 98 98  Resp: 19 19  Temp: 99.6 F (37.6 C) 98.4 F (36.9 C)  SpO2: 98% 100%  Vitals:   04/20/19 0500 04/20/19 1602 04/20/19 2059 04/21/19 0416  BP:  116/81 129/84 125/78  Pulse:  96 98 98  Resp:  18 19 19   Temp:  98.7 F (37.1 C) 99.6 F (37.6 C) 98.4 F (36.9 C)  TempSrc:  Axillary Oral Oral  SpO2:  99% 98% 100%  Weight: 80.9 kg     Height:        General: 59 y.o. female resting in bed in NAD Cardiovascular: RRR, +S1, S2, no m/g/r, equal pulses throughout Respiratory: CTABL, no w/r/r, normal WOB GI: BS+, NDNT, soft, no masses noted MSK: No e/c/c Skin: No rashes, bruises, ulcerations noted Neuro: alert to name, follows commands Psyc: cooperative; she does best with frequent interaction     The results of significant diagnostics from this hospitalization (including imaging, microbiology, ancillary and laboratory) are listed below for reference.     Microbiology: No results found for this or any previous visit (from the past 240 hour(s)).   Labs: BNP (last 3 results) No results for input(s): BNP in the last 8760 hours. Basic Metabolic Panel: No results for input(s): NA, K, CL, CO2, GLUCOSE, BUN, CREATININE, CALCIUM, MG, PHOS in the last 168 hours. Liver Function Tests: No results for input(s): AST, ALT, ALKPHOS, BILITOT, PROT, ALBUMIN in the last 168 hours. No results for input(s): LIPASE, AMYLASE in the last 168 hours. No results for input(s): AMMONIA in the last 168 hours. CBC: No results for input(s): WBC, NEUTROABS, HGB, HCT, MCV, PLT in the last 168 hours. Cardiac Enzymes: No results for input(s): CKTOTAL, CKMB, CKMBINDEX, TROPONINI in the last 168  hours. BNP: Invalid input(s): POCBNP CBG: Recent Labs  Lab 04/19/19 2127 04/20/19 0829 04/20/19 1145 04/20/19 1644 04/20/19 2147  GLUCAP 136* 115* 114* 128* 93   D-Dimer No results for input(s): DDIMER in the last 72 hours. Hgb A1c No results for input(s): HGBA1C in the last 72 hours. Lipid Profile No results for input(s): CHOL, HDL, LDLCALC, TRIG, CHOLHDL, LDLDIRECT in the last 72 hours. Thyroid function studies No results for input(s): TSH, T4TOTAL, T3FREE, THYROIDAB in the last 72 hours.  Invalid input(s): FREET3 Anemia work up No results for input(s): VITAMINB12, FOLATE, FERRITIN, TIBC, IRON, RETICCTPCT in the last 72 hours. Urinalysis    Component Value Date/Time   COLORURINE YELLOW (A) 03/08/2019 1433   APPEARANCEUR CLOUDY (A) 03/08/2019 1433   LABSPEC 1.017 03/08/2019 1433   PHURINE 7.0 03/08/2019 1433   GLUCOSEU NEGATIVE 03/08/2019 1433   HGBUR SMALL (A) 03/08/2019 1433   BILIRUBINUR NEGATIVE 03/08/2019 1433   BILIRUBINUR neg 09/25/2018 1447   KETONESUR NEGATIVE 03/08/2019 1433   PROTEINUR 100 (A) 03/08/2019 1433   UROBILINOGEN 0.2 09/25/2018 1447   NITRITE NEGATIVE 03/08/2019 1433   LEUKOCYTESUR LARGE (A) 03/08/2019 1433   Sepsis Labs Invalid input(s): PROCALCITONIN,  WBC,  LACTICIDVEN Microbiology No results found for this or any previous visit (from the past 240 hour(s)).   Time coordinating discharge: 35 minutes  SIGNED:   Jonnie Finner, DO  [facility] 04/21/2019, 7:13 AM   If 7PM-7AM, please contact night-coverage [email or web address]

## 2019-04-22 ENCOUNTER — Inpatient Hospital Stay (HOSPITAL_COMMUNITY): Payer: Medicare Other

## 2019-04-22 ENCOUNTER — Inpatient Hospital Stay (HOSPITAL_COMMUNITY): Payer: Medicare Other | Admitting: Physical Therapy

## 2019-04-22 ENCOUNTER — Encounter (HOSPITAL_COMMUNITY): Payer: Self-pay | Admitting: Physical Medicine & Rehabilitation

## 2019-04-22 DIAGNOSIS — E46 Unspecified protein-calorie malnutrition: Secondary | ICD-10-CM

## 2019-04-22 DIAGNOSIS — D72823 Leukemoid reaction: Secondary | ICD-10-CM

## 2019-04-22 DIAGNOSIS — D62 Acute posthemorrhagic anemia: Secondary | ICD-10-CM

## 2019-04-22 LAB — CBC WITH DIFFERENTIAL/PLATELET
Abs Immature Granulocytes: 0.18 10*3/uL — ABNORMAL HIGH (ref 0.00–0.07)
Basophils Absolute: 0.1 10*3/uL (ref 0.0–0.1)
Basophils Relative: 1 %
Eosinophils Absolute: 0.3 10*3/uL (ref 0.0–0.5)
Eosinophils Relative: 2 %
HCT: 35.6 % — ABNORMAL LOW (ref 36.0–46.0)
Hemoglobin: 11.1 g/dL — ABNORMAL LOW (ref 12.0–15.0)
Immature Granulocytes: 1 %
Lymphocytes Relative: 11 %
Lymphs Abs: 1.8 10*3/uL (ref 0.7–4.0)
MCH: 28 pg (ref 26.0–34.0)
MCHC: 31.2 g/dL (ref 30.0–36.0)
MCV: 89.9 fL (ref 80.0–100.0)
Monocytes Absolute: 1 10*3/uL (ref 0.1–1.0)
Monocytes Relative: 7 %
Neutro Abs: 12.7 10*3/uL — ABNORMAL HIGH (ref 1.7–7.7)
Neutrophils Relative %: 78 %
Platelets: 568 10*3/uL — ABNORMAL HIGH (ref 150–400)
RBC: 3.96 MIL/uL (ref 3.87–5.11)
RDW: 17.5 % — ABNORMAL HIGH (ref 11.5–15.5)
WBC: 16.1 10*3/uL — ABNORMAL HIGH (ref 4.0–10.5)
nRBC: 0 % (ref 0.0–0.2)

## 2019-04-22 LAB — COMPREHENSIVE METABOLIC PANEL
ALT: 75 U/L — ABNORMAL HIGH (ref 0–44)
AST: 31 U/L (ref 15–41)
Albumin: 2.5 g/dL — ABNORMAL LOW (ref 3.5–5.0)
Alkaline Phosphatase: 113 U/L (ref 38–126)
Anion gap: 11 (ref 5–15)
BUN: 18 mg/dL (ref 6–20)
CO2: 28 mmol/L (ref 22–32)
Calcium: 8.9 mg/dL (ref 8.9–10.3)
Chloride: 104 mmol/L (ref 98–111)
Creatinine, Ser: 0.95 mg/dL (ref 0.44–1.00)
GFR calc Af Amer: >60 mL/min (ref >60)
GFR calc non Af Amer: >60 mL/min (ref >60)
Glucose, Bld: 108 mg/dL — ABNORMAL HIGH (ref 70–99)
Potassium: 3.1 mmol/L — ABNORMAL LOW (ref 3.5–5.1)
Sodium: 143 mmol/L (ref 135–145)
Total Bilirubin: 1.1 mg/dL (ref 0.3–1.2)
Total Protein: 7.4 g/dL (ref 6.5–8.1)

## 2019-04-22 LAB — GLUCOSE, CAPILLARY
Glucose-Capillary: 97 mg/dL (ref 70–99)
Glucose-Capillary: 118 mg/dL — ABNORMAL HIGH (ref 70–99)
Glucose-Capillary: 85 mg/dL (ref 70–99)
Glucose-Capillary: 116 mg/dL — ABNORMAL HIGH (ref 70–99)

## 2019-04-22 MED ORDER — MEGESTROL ACETATE 400 MG/10ML PO SUSP
400.0000 mg | Freq: Two times a day (BID) | ORAL | Status: DC
  Administered 2019-04-23 – 2019-04-30 (×8): 400 mg via ORAL
  Filled 2019-04-22 (×18): qty 10

## 2019-04-22 MED ORDER — HYDROCERIN EX CREA
TOPICAL_CREAM | Freq: Every day | CUTANEOUS | Status: DC
  Filled 2019-04-22: qty 113

## 2019-04-22 MED ORDER — POTASSIUM CHLORIDE CRYS ER 20 MEQ PO TBCR
20.0000 meq | EXTENDED_RELEASE_TABLET | Freq: Two times a day (BID) | ORAL | Status: DC
  Administered 2019-04-23 – 2019-05-03 (×18): 20 meq via ORAL
  Filled 2019-04-22 (×24): qty 1

## 2019-04-22 NOTE — Evaluation (Addendum)
Speech Language Pathology Assessment and Plan  Patient Details  Name: Rebecca Bruce MRN: [id number] Date of Birth: 1960/06/09  SLP Diagnosis: Dysphagia;Cognitive Impairments  Rehab Potential: Fair ELOS: 3-3.5 weeks    Today's Date: 04/23/2019 SLP Individual Time: 4098-1191     Problem List:  Patient Active Problem List   Diagnosis Date Noted  . Encephalopathy 04/21/2019  . FTT (failure to thrive) in adult 04/09/2019  . Acute blood loss anemia   . Hemorrhagic shock (HCC)   . Pressure injury of skin 03/28/2019  . Encounter for intubation   . Catatonia   . Acute respiratory failure with hypoxemia (HCC)   . COVID-19 virus detected   . Acute metabolic encephalopathy 03/11/2019  . Acute cystitis without hematuria   . COVID-19 virus infection   . Altered mental state 03/09/2019  . Sepsis secondary to UTI (HCC) 03/09/2019  . 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 Hyperglycemia 07/13/2018  . Pre-diabetes 01/05/2017  . Other symptoms and signs involving cognitive functions and awareness 09/01/2015  . Dyslipidemia 07/02/2015  . History of hyperthyroidism 07/02/2015  . History of radioactive iodine thyroid ablation 07/02/2015  . Hypothyroidism (acquired) 07/02/2015  . Obesity 07/02/2015  . Hypertension, benign 07/02/2015  . Anxiety 07/02/2015  . Vitamin D deficiency 07/02/2015  . Insomnia 07/02/2015  . OA (osteoarthritis) of knee 07/02/2015  . GERD (gastroesophageal reflux disease) 07/02/2015  . Hyperlipidemia, unspecified 07/02/2015   Past Medical History:  Past Medical History:  Diagnosis Date  . GERD (gastroesophageal reflux disease)   . Hypertension   . Thyroid disease    Past Surgical History:  Past Surgical History:  Procedure Laterality Date  . DENTAL SURGERY    . IR ANGIOGRAM SELECTIVE EACH ADDITIONAL VESSEL  03/29/2019  . IR ANGIOGRAM SELECTIVE EACH ADDITIONAL VESSEL  03/29/2019  . IR ANGIOGRAM SELECTIVE EACH ADDITIONAL VESSEL  03/29/2019  . IR  ANGIOGRAM SELECTIVE EACH ADDITIONAL VESSEL  03/29/2019  . IR ANGIOGRAM VISCERAL SELECTIVE  03/29/2019  . IR ANGIOGRAM VISCERAL SELECTIVE  03/29/2019  . IR US GUIDE VASC ACCESS RIGHT  03/29/2019    Assessment / Plan / Recommendation Clinical Impression Rebecca Bruce is a 59 year old female with history of MR with depression/anxiety was originally admitted from her group home on 03/09/2019 with sepsis due to UTI and Covid PNA. She was treated with dexamethasone and remdesivir and was noted to be nonverbal with whole body stiffness and unresponsiveness since admission. 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 Per review of records--family reported that patient started regressing around age 65-13 and was placed in a group home after her mother passed away. She did require intubation from 12/4-12/14 due to VDRF and hospital course significant for hypotension with a BLA on 12/17 due to spontaneous hemoperitoneum LUQ of unknown etiology. She was transfused with 4 total PRBC, anticoagulation discontinued and conservative care recommended by CCS. 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   EEG done revealing severe diffuse encephalopathy.LP showed mildly elevated protein without oligoclonal bands and was negative for anti--GAD antibodies. MRI brain 12/3 and repeat of 12/21 due to persistent encephalopathy did not show any acute changes.Thiamine recommended due to possible malnutrition. CT cervical spine done due to neck stiffness and showed multilevel stenosis. Dr. Dutch Quint question myelopathy contributing to rigidity and flexor withdrawal but no surgical needs indicated at this time. Dr. Luisa Hart  felt that encephalopathy multifactorial in someone  with " a poor prodrome"and prolonged immobility from intubation which was likely playing a role in her current weakness. He recommended supportive care as anticipated weeks to months for recovery. Speech therapy evaluated patient who was placed on modified diet however due to poor p.o. intake diet was advanced to dysphagia 3 thins in the hope of stimulating appetite. Therapy ongoing and patient is showing improvement in participation with activity with caregiver from her group home providing encouragement. She continues to be severely debilitated. CIR recommended for follow-up therapy.  Pt presents with severe cognitive linguistic deficits, however baseline abilities are unclear at this time. Pt demonstrated orientation to self only, immediate recall of place x1, ability to follow 1 step direction with 60% accuracy, basic problem solving total-max A (use of call bell and sorting PEGs by two colors), sustained attention in 30 second intervals and no awareness of deficits. Pt demonstrated 50% intelligibility at the phrase level, with no awareness of slurred speech and is moderately preservative. Pt was unable to consistently demonstrate oral motor commands during CN assessment. Pt consumed only thin via straw and limited amounts of dys 3 textures, refusing thin via cup and regular textured trials. Pt demonstrated appropriate mastication (edentulous), oral clearance and timely swallow on dys 3 textures and x1 swallow delayed noted on thin via straw while independently consuming small sips. No overt s/s aspiration noted on solids and liquids. SLP recommends continued diet of dys 3 and thin liquids, full supervision to encourage PO intake, limit distractions and check of pocketing (PT noted pocketing from breakfast tray 2 hours later.) Pt's caregiver from group is scheduled to be present in upcoming session and SLP will establish baseline cognitive-linguistic ability and set goals accordingly. Pt would benefit  from skills ST services in order to maximize functional independence and reduce burden of care, likely requiring 24 hour supervision and possible continue ST services.    Skilled Therapeutic Interventions          SLP targeted cognitive skills. SLP facilitated informal cognitive linguistic skills assessment and BSS with lunch tray. SLP provided education on current deficits and plan for treatment sessions with SLP. Pt was left in room with call bell within reach and bed alarm set.   SLP Assessment  Patient will need skilled Speech Lanaguage Pathology Services during CIR admission    Recommendations  SLP Diet Recommendations: Thin;Dysphagia 3 (Mech soft) Liquid Administration via: Cup;Straw Medication Administration: Crushed with puree Supervision: Full supervision/cueing for compensatory strategies Compensations: Slow rate;Small sips/bites;Minimize environmental distractions;Lingual sweep for clearance of pocketing Postural Changes and/or Swallow Maneuvers: Seated upright 90 degrees Oral Care Recommendations: Oral care BID;Staff/trained caregiver to provide oral care Patient destination: Home Follow up Recommendations: 24 hour supervision/assistance;Skilled Nursing facility;Home Health SLP Equipment Recommended: None recommended by SLP    SLP Frequency 3 to 5 out of 7 days   SLP Duration  SLP Intensity  SLP Treatment/Interventions 3-3.5 weeks  Minumum of 1-2 x/day, 30 to 90 minutes  Cognitive remediation/compensation;Dysphagia/aspiration precaution training;Functional tasks;Internal/external aids;Speech/Language facilitation;Patient/family education    Pain    Prior Functioning Cognitive/Linguistic Baseline: Information not available Type of Home: Group Home  Lives With: Other (Comment) Available Help at Discharge: Available 24 hours/day;Personal care attendant  SLP Evaluation Cognition    Comprehension Auditory Comprehension Yes/No Questions: Impaired Basic Biographical  Questions: 76-100% accurate Complex Questions: 0-24% accurate Commands: Impaired One Step Basic Commands: 50-74% accurate Conversation: Simple Interfering Components: Attention Visual Recognition/Discrimination Discrimination: Not tested Reading Comprehension Reading Status:  Impaired Word level: Impaired Expression Expression Primary Mode of Expression: Verbal Verbal Expression Overall Verbal Expression: Impaired Initiation: No impairment Level of Generative/Spontaneous Verbalization: Phrase;Sentence Written Expression Written Expression: Not tested Oral Motor Oral Motor/Sensory Function Overall Oral Motor/Sensory Function: Other (comment)(limited ability to follow commands, appeared [facility]) Motor Speech Overall Motor Speech: Appears within functional limits for tasks assessed Respiration: Within functional limits Phonation: Normal Intelligibility: Intelligibility reduced Phrase: 50-74% accurate Motor Planning: Witnin functional limits Motor Speech Errors: Unaware   PMSV Assessment  PMSV Trial Intelligibility: Intelligibility reduced Phrase: 50-74% accurate  Bedside Swallowing Assessment General Diet Prior to this Study: Dysphagia 3 (soft);Thin liquids History of Recent Intubation: Yes Length of Intubations (days): 10 days Date extubated: 03/25/19 Behavior/Cognition: Alert;Distractible;Requires cueing Oral Cavity - Dentition: Edentulous Self-Feeding Abilities: Needs assist Patient Positioning: Upright in bed Baseline Vocal Quality: Normal Volitional Cough: Cognitively unable to elicit Volitional Swallow: Unable to elicit  Oral Care Assessment Does patient have any of the following "high(er) risk" factors?: None of the above Does patient have any of the following "at risk" factors?: Nutritional status - dependent feeder Patient is HIGH RISK: Non-ventilated: Order set for Adult Oral Care Protocol initiated - "High Risk Patients - Non-Ventilated" option selected  (see  row information) Patient is AT RISK: Order set for Adult Oral Care Protocol initiated -  "At Risk Patients" option selected (see row information) Patient is LOW RISK: Follow universal precautions (see row information) Ice Chips Ice chips: Not tested Thin Liquid Thin Liquid: Within functional limits Presentation: Straw Nectar Thick Nectar Thick Liquid: Not tested Honey Thick   Puree Puree: Not tested Solid Solid: Within functional limits Presentation: Self Fed Other Comments: dys 3 trial only refused regular BSE Assessment Risk for Aspiration Impact on safety and function: Risk for inadequate nutrition/hydration Other Related Risk Factors: Cognitive impairment;Decreased management of secretions  Short Term Goals: Week 1: SLP Short Term Goal 1 (Week 1): Pt will demonstrate sustained attention in 1 minute interval with max A verbal cues for redirection. SLP Short Term Goal 2 (Week 1): Pt will follow basic 1 step commands in functional tasks with 70% accuracy given max A verbal cues. SLP Short Term Goal 3 (Week 1): Pt will demonstrate orientation to place and time with max A verbal/visual cues. SLP Short Term Goal 4 (Week 1): Pt will express wants/needs at phrase level with mod A verbal cues for clarifcation of message. SLP Short Term Goal 5 (Week 1): Pt will consume dys 3 and thin liquid diet with minimal overt s/s aspiration and supervision A verbal cues for effective mastictaion and oral clearance.  Refer to Care Plan for Long Term Goals  Recommendations for other services: None   Discharge Criteria: Patient will be discharged from SLP if patient refuses treatment 3 consecutive times without medical reason, if treatment goals not met, if there is a change in medical status, if patient makes no progress towards goals or if patient is discharged from hospital.  The above assessment, treatment plan, treatment alternatives and goals were discussed and mutually agreed upon: by  patient  Rebecca Bruce  [facility] 04/23/2019, 8:03 AM

## 2019-04-22 NOTE — Progress Notes (Signed)
PMR Admission Coordinator Pre-Admission Assessment   Patient: Rebecca Bruce is an 59 y.o., female MRN: [id number] DOB: 12-29-1960 Height: 5\' 2"  (157.5 cm) Weight: 83.4 kg                                                                                                                                                  Insurance Information HMO:     PPO:      PCP:      IPA:      80/20:      OTHER:  PRIMARY: Medicare A and B      Policy#: [id number]      Subscriber: pt CM Name:       Phone#:      Fax#:  Pre-Cert#: verified eligibility online      Employer:  Benefits:  Phone #:      Name:  Eff. Date: A and B 09/10/95     Deduct: $1484      Out of Pocket Max: n/a      Life Max: n/a CIR: 100%      SNF: 20 full days Outpatient: 80%     Co-Pay: 20% Home Health: 100%      Co-Pay:  DME: 80%     Co-Pay: 20% Providers:  SECONDARY: Medicaid [place] [employer] (coverage code SAD)    Policy#: [id number]      Subscriber: pt CM Name:       Phone#:      Fax#:  Pre-Cert#:       Employer:  Benefits:  Phone #:      Name:  Eff. Date:      Deduct:       Out of Pocket Max:       Life Max:  CIR:       SNF:  Outpatient:      Co-Pay:  Home Health:       Co-Pay:  DME:      Co-Pay:    Medicaid Application Date:       Case Manager:  Disability Application Date:       Case Worker:    The "Data Collection Information Summary" for patients in Inpatient Rehabilitation Facilities with attached "Privacy Act [place] Records" was provided and verbally reviewed with: Family   Emergency Contact Information         Contact Information     Name Relation Home Work Terrell, [name] Sister [id number]        Noelia, [place] Brother     [place]   [place], [place] Sister     [id number]    [facility] home, [facility]     [phone]       Current Medical History  Patient Admitting Diagnosis: debility 2/2 Covid-19  History of Present Illness:  Rebecca Bruce is a 59 y.o. female with history of MR with depression/anxiety who was originally admitted from her group home on 03/09/19 with sepsis due to UTI and Covid PNA, treated with dexamethasone and remdesivir. Since admission patient noted to be nonverbal with whole body stiffness and unresponsiveness. Neurology consulted for input and felt that patient paratonic with question of retarded catatonia but to continue antibacterial meningitic treatment . Per review of records--family reported that patient started regressing around age 50-13 and was placed in group home after her mother passed away. She was intubated 12/4-12/14 due to respiratory failure and hospital course significant for hypotension with ABLA 12/17 due to hemoperitoneum LUQ due to unkonwn etiology. She was transfused with 2 units PRBC, anticoagulation discontinued and conservative care recommended by [facility]. 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  EEG with severe diffuse encephalopathy. LP with mildly elevated protein without oligoclonal bands and negative for anti-GAD antibodies. MRI brain 12/3 and repeat 12/21 due to persistent encephalopathy and did not show acute changes. Thiamine recommended due to possible malnutrition.   MRI C spine done due to neck stiffness and showed multilevel stenosis and Dr. Annette Stable questioned of myelopathy contributing to rigidity and flexor withdrawal but no surgical needs indicated at this time.  Dr. Leonel Ramsay felt that encephalopathy multifactorial in someone with "a poor prodrome" and prolonged immobility from intubation may play role in current weakness. He recommends supportive care over weeks to months to monitor for recovery. Patient on dysphagia 3 diet, thin liquids and tolerating.  Therapy ongoing and patient resistant  but shows improvement in participation with caregiver from group home. CIR recommended due to functional decline and to decrease burden of care.     Past Medical History      Past Medical History:  Diagnosis Date  . GERD (gastroesophageal reflux disease)    . Hypertension    . Thyroid disease        Family History  family history includes Heart disease in her mother.   Prior Rehab/Hospitalizations:  Has the patient had prior rehab or hospitalizations prior to admission? No   Has the patient had major surgery during 100 days prior to admission? No   Current Medications    Current Facility-Administered Medications:  .  0.9 %  sodium chloride infusion, , Intravenous, PRN, Rigoberto Noel, MD, Stopped at 03/12/19 1210 .  acetaminophen (TYLENOL) tablet 650 mg, 650 mg, Oral, Q6H PRN, Skeet Simmer, RPH .  ascorbic acid (VITAMIN C) tablet 500 mg, 500 mg, Oral, Daily, Skeet Simmer, RPH, 500 mg at 04/19/19 1053 .  chlorhexidine (PERIDEX) 0.12 % solution 15 mL, 15 mL, Mouth Rinse, BID, Harold Hedge, MD, 15 mL at 04/19/19 1054 .  Chlorhexidine Gluconate Cloth 2 % PADS 6 each, 6 each, Topical, Daily, Rigoberto Noel, MD, 6 each at 04/18/19 [id number] .  docusate sodium (COLACE) capsule 100 mg, 100 mg, Oral, Daily, Skeet Simmer, RPH, 100 mg at 04/19/19 1053 .  feeding supplement (ENSURE ENLIVE) (ENSURE ENLIVE) liquid 237 mL, 237 mL, Oral, TID BM, Little Ishikawa, MD, 237 mL at 04/19/19 1053 .  feeding supplement (PRO-STAT SUGAR FREE 64) liquid 30 mL, 30 mL, Oral, BID, Kyle, Tyrone A, DO, 30 mL at 04/19/19 1053 .  guaiFENesin (ROBITUSSIN) 100 MG/5ML solution 100 mg, 5 mL, Oral, BID, Elana Alm,  Belia Heman, RPH, 100 mg at 04/19/19 1054 .  hydrALAZINE (APRESOLINE) injection 5 mg, 5 mg, Intravenous, Q6H PRN, Harold Hedge, MD, 5 mg at 04/05/19 1818 .  insulin aspart (novoLOG) injection 0-15 Units, 0-15 Units, Subcutaneous, TID WC, Bodenheimer, Charles A, NP, 2 Units at 04/18/19 1633 .  insulin  aspart (novoLOG) injection 0-5 Units, 0-5 Units, Subcutaneous, QHS, Bodenheimer, Charles A, NP .  Ipratropium-Albuterol (COMBIVENT) respimat 1 puff, 1 puff, Inhalation, Q6H PRN, Samuella Cota, MD .  levothyroxine (SYNTHROID) tablet 100 mcg, 100 mcg, Oral, Q0600, Skeet Simmer, RPH, 100 mcg at 04/19/19 [id number] .  MEDLINE mouth rinse, 15 mL, Mouth Rinse, q12n4p, Harold Hedge, MD, 15 mL at 04/18/19 1634 .  multivitamin with minerals tablet 1 tablet, 1 tablet, Oral, Daily, Little Ishikawa, MD, 1 tablet at 04/19/19 1053 .  Resource ThickenUp Clear, , Oral, PRN, Harold Hedge, MD, Given at 04/11/19 1700 .  sodium chloride flush (NS) 0.9 % injection 10-40 mL, 10-40 mL, Intracatheter, Q12H, Rigoberto Noel, MD, 10 mL at 04/18/19 0835 .  sodium chloride flush (NS) 0.9 % injection 10-40 mL, 10-40 mL, Intracatheter, PRN, Rigoberto Noel, MD, 10 mL at 04/05/19 0412 .  thiamine tablet 100 mg, 100 mg, Oral, Daily, Skeet Simmer, RPH, 100 mg at 04/19/19 1054 .  white petrolatum (VASELINE) gel, , Topical, PRN, Rigoberto Noel, MD .  zinc sulfate capsule 220 mg, 220 mg, Oral, Daily, Skeet Simmer, RPH, 220 mg at 04/19/19 1054   Patients Current Diet:     Diet Order                      DIET DYS 3 Room service appropriate? No; Fluid consistency: Thin  Diet effective now                   Precautions / Restrictions Precautions Precautions: Fall Precaution Comments: MR at baseline Restrictions Weight Bearing Restrictions: No    Has the patient had 2 or more falls or a fall with injury in the past year?No   Prior Activity Level Limited Community (1-2x/wk): lived in a group home, was independent w/o device, helping with chores, dancing, etc.   Prior Functional Level Prior Function Level of Independence: Independent Comments: Pt walked without an AD; able to bath and dress herself; pt loves to vacuum; sweep; clean adn dance; has a boyfriend named [place]: Did the patient  need help bathing, dressing, using the toilet or eating?  Independent   Indoor Mobility: Did the patient need assistance with walking from room to room (with or without device)? Independent   Stairs: Did the patient need assistance with internal or external stairs (with or without device)? Independent   Functional Cognition: Did the patient need help planning regular tasks such as shopping or remembering to take medications? Needed some help   Home Assistive Devices / [place] Devices/Equipment: None   Prior Device Use: Indicate devices/aids used by the patient prior to current illness, exacerbation or injury? None of the above   Current Functional Level Cognition   Overall Cognitive Status: Impaired/Different from baseline(Nearing baseline) Current Attention Level: Sustained Orientation Level: Oriented to person, Oriented to situation, Disoriented to place, Disoriented to time Following Commands: Follows one step commands inconsistently Safety/Judgement: Decreased awareness of safety, Decreased awareness of deficits General Comments: Still saying "no!" a lot, but with Karena Addison, her familiar caregiver, she participated a bit more; in addition,  towards teh end of her session, she offered to help [place], [place]; Foster indicated she is often offering help to staff at home    Extremity Assessment (includes Sensation/Coordination)   Upper Extremity Assessment: Generalized weakness, RUE deficits/detail, LUE deficits/detail RUE Deficits / Details: No AROM. Resistance and/or tone noted during shld PROM. Elbow, wrist, and digit PROM WFL. RUE Coordination: decreased fine motor, decreased gross motor LUE Deficits / Details: No AROM. Resistance and/or tone noted during shld PROM. Elbow, wrist, and digit PROM WFL LUE Coordination: decreased fine motor, decreased gross motor  Lower Extremity Assessment: Defer to PT evaluation RLE Deficits / Details: no volitional movement, upgoing toes and  withdrawal to DPS. > extensor one is not present, able to Passively flex hip and knee when supine, Adductors are more relaxed. LLE Deficits / Details: same as right     ADLs   Overall ADL's : Needs assistance/impaired Eating/Feeding: NPO Grooming: Moderate assistance Grooming Details (indicate cue type and reason): With hand over hand assist, using L hand and cues x 2 Upper Body Bathing: Bed level, Total assistance Lower Body Bathing: Total assistance, Bed level Upper Body Dressing : Total assistance, Bed level Lower Body Dressing: Total assistance, Bed level Toilet Transfer: Total assistance(bed level) Toileting- Clothing Manipulation and Hygiene: Total assistance, Bed level Functional mobility during ADLs: (Not attempted this date. Pt unable to follow commands) General ADL Comments: pt followed 1 step command 1/4 trials with multimodal cues and redirection     Mobility   Overal bed mobility: Needs Assistance Bed Mobility: Supine to Sit Rolling: +2 for physical assistance, +2 for safety/equipment, Total assist Supine to sit: Mod assist, +2 for physical assistance, Max assist Sit to supine: Mod assist, +2 for physical assistance General bed mobility comments: Tends to decline initially; Heavy mod assist and lots of encouragement to get up to EOB; Once up to EOB, Max assist needed due to heavy posterior lean     Transfers   Overall transfer level: Needs assistance Equipment used: 2 person hand held assist(and support at gait belt) Transfer via Lift Equipment: Marketing executive Transfers: Public house manager, Sit to/from Stand Sit to Stand: +2 physical assistance, Max assist(used Marketing executive) Stand pivot transfers: +2 physical assistance, Max assist Squat pivot transfers: +2 physical assistance, +2 safety/equipment, Max assist General transfer comment: +2 Max assist for squat pivot transfer to recliner with drop-arm down (to approximate tsfr to [facility]); then practiced standing from recliner with  sara plus lift; Able to get her on her feet in [place], but initially with minimal LE muscle activation to stand; Once up, though, noted postivie effort to "stand up straight" in [place], with notable work at knee and hip extension, though did not get fully upright; 2 bouts of standing with sara plus lift     Ambulation / Gait / Stairs / Wheelchair Mobility         Posture / Balance Dynamic Sitting Balance Sitting balance - Comments: Sat EOB approx 10 minutes, with mild posterior lean and propping herself with bil UEs placed slightly behind her; assist ranging from Heavy mod to minguard assist; occasionally with one hand propped when handed her ensure to drink Balance Overall balance assessment: Needs assistance Sitting-balance support: Bilateral upper extremity supported Sitting balance-Leahy Scale: (approaching Fair) Sitting balance - Comments: Sat EOB approx 10 minutes, with mild posterior lean and propping herself with bil UEs placed slightly behind her; assist ranging from Heavy mod to minguard assist; occasionally with one hand propped when handed  her ensure to drink Postural control: Left lateral lean, Posterior lean     Special needs/care consideration BiPAP/CPAP no CPM no Continuous Drip IV no Dialysis no        Days n/a Life Vest no Oxygen no Special Bed no Trach Size no Wound Vac (area) no      Location n/a Skin stag 2 to L buttocks, early healing                  Bowel mgmt: incontinent Bladder mgmt: incontinent Diabetic mgmt yes Behavioral consideration yes, pt is intellectually disabled, lives in a group home Chemo/radiation no        Previous Home Environment (from acute therapy documentation) Living Arrangements: Group Home Type of Home: [place] Name: [place]: No   Discharge Living Setting Plans for Discharge Living Setting: Other (Comment) Type of Home at Discharge: [place] Name at  Discharge: [facility] Discharge Home Layout: One level Discharge Home Access: Level entry, Ramped entrance Discharge Bathroom Shower/Tub: Walk-in shower Discharge Bathroom Toilet: Handicapped height Discharge Bathroom Accessibility: Yes How Accessible: Accessible via wheelchair Does the patient have any problems obtaining your medications?: No   Social/Family/Support Systems Anticipated Caregiver: group home staff, contact is Gena Fray Anticipated Caregiver's Contact Information: Butch Penny [phone]), [name] (sister [phone]) Caregiver Availability: 24/7 Discharge Plan Discussed with Primary Caregiver: Yes Is Caregiver In Agreement with Plan?: Yes Does Caregiver/Family have Issues with Lodging/Transportation while Pt is in Rehab?: No     Goals/Additional Needs Patient/Family Goal for Rehab: PT/OT/SLP min assist Expected length of stay: 3-4 weeks Dietary Needs: D3/thin Additional Information: pt intellectually disabled at baseline, does well with a consistent routine.  Will have a group home staff member plan to be present for some (not all) therapy sessions to help facilitate participation  Pt/Family Agrees to Admission and willing to participate: Yes Program Orientation Provided & Reviewed with Pt/Caregiver Including Roles  & Responsibilities: Yes     Decrease burden of Care through IP rehab admission: Diet advancement, Decrease number of caregivers, Bowel and bladder program and Patient/family education     Possible need for SNF placement upon discharge: n/a     Patient Condition: This patient's medical and functional status has changed since the consult dated: 04/18/19 in which the Rehabilitation Physician determined and documented that the patient's condition is appropriate for intensive rehabilitative care in an inpatient rehabilitation facility. See "History of Present Illness" (above) for medical update. Functional changes are: pt max +2. Patient's medical  and functional status update has been discussed with the Rehabilitation physician and patient remains appropriate for inpatient rehabilitation. Will admit to inpatient rehab today.   Preadmission Screen Completed By:  Michel Santee, PT, DPT 04/19/2019 12:49 PM ______________________________________________________________________   Discussed status with Dr. Letta Pate on 04/19/19 at 1:02 PM  and received approval for admission today.   Admission Coordinator:  Michel Santee, PT, DPT Time 1:03 Heidi Dach Sudie Grumbling 04/19/19

## 2019-04-22 NOTE — Progress Notes (Signed)
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 Marylyn Ishihara      HPI: Rebecca Bruce is a 59 y.o. female with history of MR with depression/anxiety who was originally admitted from her group home on 03/09/19 with sepsis due to UTI and Covid PNA treated with dexamethasone and remdesivir. Since admission patient noted to be nonverbal with whole body stiffness and unresponsiveness. Neurology consulted for input and felt that patient paratonic with question of retarded catatonia but to continue antibacterial meningitic treatment . Per review of records--family reported that patient started regressing around age 31-13 and was placed in group home after her mother passed away. She was intubated 12/4-12/14 due to VDRD and hospital course significant for hypotension with ABLA 12/17 due to hemoperitoneum LUQ due to unkonwn etiology. She was transfused with 2 units PRBC, anticoagulation discontinued and conservative care recommended by [facility]. 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    EEG with severe diffuse encephalopathy. LP with mildly elevated protein without oligoclonal bands and negative for anti-GAD antibodies. MRI brain 12/3 and repeat 12/21 due to persistent encephalopathy and did not show acute changes. Thiamine recommended due to possible malnutrition.   MRI C spine done due to neck stiffness and showed multilevel stenosis and Dr. Annette Stable questioned of myelopathy contributing to rigidity and flexor withdrawal but no surgical needs indicated at this time.  Dr. Leonel Ramsay felt that encephalopathy multifactorial in someone with "a poor prodrome" and prolonged immobility from intubation may play role in current weakness. He  recommends supportive care over weeks to months to monitor for recovery. Patient on dysphagia diet nectar liquids but has had poor po intake requiring cortak for nutritional support. Diet advanced to dysphagia 3, thins in hopes of improving intake. Therapy ongoing and patient resistant but shows improvement in participation with caregiver from group home. CIR recommended due to functional decline and to decrease burden of care.     Review of Systems  Unable to perform ROS: Language        Past Medical History:  Diagnosis Date  . GERD (gastroesophageal reflux disease)    . Hypertension    . Thyroid disease             Past Surgical History:  Procedure Laterality Date  . DENTAL SURGERY      . IR ANGIOGRAM SELECTIVE EACH ADDITIONAL VESSEL   03/29/2019  . IR ANGIOGRAM SELECTIVE EACH ADDITIONAL VESSEL   03/29/2019  . IR ANGIOGRAM SELECTIVE EACH ADDITIONAL VESSEL   03/29/2019  . IR ANGIOGRAM SELECTIVE EACH ADDITIONAL VESSEL   03/29/2019  . IR ANGIOGRAM VISCERAL SELECTIVE   03/29/2019  . IR ANGIOGRAM VISCERAL SELECTIVE   03/29/2019  . IR US GUIDE VASC ACCESS RIGHT   03/29/2019           Family History  Problem Relation Age of Onset  . Heart disease Mother    . Breast cancer Neg Hx        Social History:  Lives in a group home in [place].  Per  reports that she has never smoked. She has never used smokeless tobacco. Per reports that she does not drink alcohol or use drugs.      Allergies: No Known Allergies  Medications Prior to Admission  Medication Sig Dispense Refill  . ascorbic acid (VITAMIN C) 500 MG/5ML syrup Take 5 mLs (500 mg total) by mouth daily. 473 mL 12  . atorvastatin (LIPITOR) 20 MG tablet TAKE 1 TABLET BY MOUTH AT BEDTIME FOR CHOLESTEROL (Patient taking differently: Take 20 mg by mouth at bedtime. ) 30 tablet 5  . dexamethasone (DECADRON) 10 MG/ML injection Inject 0.6 mLs (6 mg total) into the vein daily. 1 mL 0  . gabapentin (NEURONTIN) 300 MG  capsule Take 1 capsule (300 mg total) by mouth at bedtime. 30 capsule 2  . levothyroxine (SYNTHROID) 100 MCG tablet Take 1 tablet (100 mcg total) by mouth daily before breakfast. 30 tablet 2  . LORazepam (ATIVAN) 0.5 MG tablet Take 0.5 mg by mouth daily as needed for anxiety. Can take an extra pill 30 minutes after the first dose if needed      . meropenem 1 g in sodium chloride 0.9 % 100 mL Inject 1 g into the vein every 8 (eight) hours.      . mupirocin ointment (BACTROBAN) 2 % Apply 1 application topically 2 (two) times daily as needed (for itchy bumps).       Marland Kitchen PARoxetine (PAXIL) 30 MG tablet TAKE 1 TABLET BY MOUTH AT BEDTIME FOR DEPRESSION (Patient taking differently: Take 30 mg by mouth at bedtime. ) 30 tablet 5  . saccharomyces boulardii (FLORASTOR) 250 MG capsule Take 250 mg by mouth daily.      Marland Kitchen triamcinolone cream (KENALOG) 0.1 % Apply 1 application topically 2 (two) times daily. For two weeks and stop, if no improvement return for follow up (Patient taking differently: Apply 1 application topically 2 (two) times daily as needed (for itchy bumps). ) 45 g 0  . triamterene-hydrochlorothiazide (MAXZIDE-25) 37.5-25 MG tablet TAKE 1/2 TABLET BY MOUTH EVERY DAY (Patient taking differently: Take 0.5 tablets by mouth daily. ) 15 tablet 11  . vancomycin 1,750 mg in sodium chloride 0.9 % 500 mL Inject 1,750 mg into the vein daily.      Marland Kitchen VIACTIV CALCIUM PLUS D 650-12.5-40 MG-MCG CHEW CHEW AND SWALLOW ONE PIECE TWICE DAILY. (SUPPLEMENT) CARAMEL (Patient taking differently: Chew 1 Dose by mouth 2 (two) times daily. ) 60 tablet 12  . zinc sulfate 220 (50 Zn) MG capsule Take 1 capsule (220 mg total) by mouth daily.          Home: Home Living Family/patient expects to be discharged to:: Skilled nursing facility  Functional History: Prior Function Level of Independence: Independent Comments: Pt walked without an AD; able to bath and dress herself; pt loves to vacuum; sweep; clean adn dance; has a  boyfriend named Vicente Serene Functional Status:  Mobility: Bed Mobility Overal bed mobility: Needs Assistance Bed Mobility: Supine to Sit Rolling: +2 for physical assistance, +2 for safety/equipment, Total assist Supine to sit: Mod assist, +2 for physical assistance, Max assist Sit to supine: Mod assist, +2 for physical assistance General bed mobility comments: Tends to decline initially; Heavy mod assist and lots of encouragement to get up to EOB; Once up to EOB, Max assist needed due to heavy posterior lean Transfers Overall transfer level: Needs assistance Equipment used: 2 person hand held assist(and support at gait belt) Transfer via Lift Equipment: Marketing executive Transfers: Public house manager, Sit to/from Stand Sit to Stand: +2 physical assistance, Max assist(used Marketing executive) Stand pivot transfers: +2 physical assistance, Max assist Squat pivot transfers: +2 physical assistance, +2 safety/equipment, Max assist General transfer comment: +2  Max assist for squat pivot transfer to recliner with drop-arm down (to approximate tsfr to [facility]); then practiced standing from recliner with sara plus lift; Able to get her on her feet in [place], but initially with minimal LE muscle activation to stand; Once up, though, noted postivie effort to "stand up straight" in [place], with notable work at knee and hip extension, though did not get fully upright; 2 bouts of standing with sara plus lift   ADL: ADL Overall ADL's : Needs assistance/impaired Eating/Feeding: NPO Grooming: Moderate assistance Grooming Details (indicate cue type and reason): With hand over hand assist, using L hand and cues x 2 Upper Body Bathing: Bed level, Total assistance Lower Body Bathing: Total assistance, Bed level Upper Body Dressing : Total assistance, Bed level Lower Body Dressing: Total assistance, Bed level Toilet Transfer: Total assistance(bed level) Toileting- Clothing Manipulation and Hygiene: Total assistance, Bed  level Functional mobility during ADLs: (Not attempted this date. Pt unable to follow commands) General ADL Comments: pt followed 1 step command 1/4 trials with multimodal cues and redirection   Cognition: Cognition Overall Cognitive Status: Impaired/Different from baseline(Nearing baseline) Orientation Level: Oriented to person Cognition Arousal/Alertness: Awake/alert Behavior During Therapy: Restless(but noting slightly better interactions) Overall Cognitive Status: Impaired/Different from baseline(Nearing baseline) Area of Impairment: Attention, Following commands, Awareness, Safety/judgement, Problem solving Current Attention Level: Sustained Following Commands: Follows one step commands inconsistently Safety/Judgement: Decreased awareness of safety, Decreased awareness of deficits Awareness: Intellectual Problem Solving: Requires tactile cues, Requires verbal cues General Comments: Still saying "no!" a lot, but with Karena Addison, her familiar caregiver, she participated a bit more; in addition, towards teh end of her session, she offered to help [employer], [place]; [place] indicated she is often offering help to staff at home     Blood pressure 111/64, pulse 93, temperature 98.6 F (37 C), temperature source Oral, resp. rate 20, height 5\' 2"  (1.575 m), weight 83.4 kg, SpO2 95 %. Physical Exam  Nursing note and vitals reviewed. Constitutional: She appears well-developed and well-nourished.  Sitting up in chair eating ice cream. NAD.   Musculoskeletal:     Comments: Large resolving hematoma left biceps --non tender to touch. Right ankle discomfort with ROM.   Neurological: She is alert.  Able to state her name as "Rebecca Bruce" --asking to chair. Continues to say yes but more verbal this am. Tends to yell and says NO to most questions.  Restless--trying to get out of bed.  She did not want to participate in exam but did grip both hands--limited movement at shoulders due to resistance and ? discomfort.         Lab Results Last 24 Hours  Results for orders placed or performed during the hospital encounter of 03/11/19 (from the past 24 hour(s))  Glucose, capillary     Status: None    Collection Time: 04/17/19 11:39 AM  Result Value Ref Range    Glucose-Capillary 99 70 - 99 mg/dL  Glucose, capillary     Status: None    Collection Time: 04/17/19  4:32 PM  Result Value Ref Range    Glucose-Capillary 98 70 - 99 mg/dL  Glucose, capillary     Status: None    Collection Time: 04/17/19  9:47 PM  Result Value Ref Range    Glucose-Capillary 98 70 - 99 mg/dL  Glucose, capillary     Status: None    Collection Time: 04/18/19  7:46 AM  Result Value Ref Range    Glucose-Capillary 89 70 - 99 mg/dL  Imaging Results (Last 48 hours)  No results found.       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 Does the need for close, 24 hr/day medical supervision in concert with the patient's rehab needs make it unreasonable for this patient to be served in a less intensive setting? Yes and Potentially 2. Co-Morbidities requiring supervision/potential complications: cervical stenosis of unclear significance (less likely a source of weakness however, sepsis/UTI history 3. Due to bladder management, bowel management, safety, skin/wound care, disease management, medication administration, pain management and patient education, does the patient require 24 hr/day rehab nursing? Potentially 4. Does the patient require coordinated care of a physician, rehab nurse, therapy disciplines of PT, SLP, OT to address physical and functional deficits in the context of the above medical diagnosis(es)? Yes and Potentially Addressing deficits in the following areas: balance, endurance, locomotion, strength, transferring, bowel/bladder control, bathing, dressing, feeding, grooming, toileting, cognition, speech, language, swallowing and psychosocial support 5. Can the patient actively  participate in an intensive therapy program of at least 3 hrs of therapy per day at least 5 days per week? Potentially 6. The potential for patient to make measurable gains while on inpatient rehab is fair 7. Anticipated functional outcomes upon discharge from inpatient rehab are  min assist+  with PT, supervision, min assist and mod assist with OT,   min assist+ with SLP. 8. Estimated rehab length of stay to reach the above functional goals is: ?20-30 days 9. Anticipated discharge destination: Home (Group home) ? 10. Overall Rehab/Functional Prognosis: fair   RECOMMENDATIONS: This patient's condition is appropriate for continued rehabilitative care in the following setting: see below Patient has agreed to participate in recommended program. N/A Note that insurance prior authorization may be required for reimbursement for recommended care.   Comment: Given pt's history of MR and premorbid needs, I anticipate that she will need substantial assistance at group home (at least min assistance ) even after a 3-4 week rehab stay. Can her home and caregivers provide this? Rehab Admissions Coordinator to follow up.   Thanks,   [name], MD, [facility]   I have personally performed a face to face diagnostic evaluation of this patient. Additionally, I have examined pertinent labs and radiographic images. I have reviewed and concur with the physician assistant's documentation above.       Bary Leriche, PA-C 04/18/2019

## 2019-04-22 NOTE — Progress Notes (Signed)
Patient information reviewed and entered into [facility] by Becky Admire Bunnell, PPS coordinator. Information including medical coding, function ability, and quality indicators will be reviewed and updated through discharge.   

## 2019-04-22 NOTE — Evaluation (Signed)
Occupational Therapy Assessment and Plan  Patient Details  Name: Rebecca Bruce MRN: [id number] Date of Birth: December 23, 1960  OT Diagnosis: altered mental status, cognitive deficits and muscle weakness (generalized) Rehab Potential: Rehab Potential (ACUTE ONLY): Fair ELOS: 2-3 weeks   Today's Date: 04/22/2019 OT Individual Time: [id number] OT Individual Time Calculation (min): 75 min     Problem List:  Patient Active Problem List   Diagnosis Date Noted  . Encephalopathy 04/21/2019  . FTT (failure to thrive) in adult 04/09/2019  . Acute blood loss anemia   . Hemorrhagic shock ([place])   . Pressure injury of skin 03/28/2019  . Encounter for intubation   . Catatonia   . Acute respiratory failure with hypoxemia ([place])   . COVID-19 virus detected   . Acute metabolic encephalopathy 14/48/1856  . Acute cystitis without hematuria   . COVID-19 virus infection   . Altered mental state 03/09/2019  . Sepsis secondary to UTI ([place]) 03/09/2019  . 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 Hyperglycemia 07/13/2018  . Pre-diabetes 01/05/2017  . Other symptoms and signs involving cognitive functions and awareness 09/01/2015  . Dyslipidemia 07/02/2015  . History of hyperthyroidism 07/02/2015  . History of radioactive iodine thyroid ablation 07/02/2015  . Hypothyroidism (acquired) 07/02/2015  . Obesity 07/02/2015  . Hypertension, benign 07/02/2015  . Anxiety 07/02/2015  . Vitamin D deficiency 07/02/2015  . Insomnia 07/02/2015  . OA (osteoarthritis) of knee 07/02/2015  . GERD (gastroesophageal reflux disease) 07/02/2015  . Hyperlipidemia, unspecified 07/02/2015    Past Medical History:  Past Medical History:  Diagnosis Date  . GERD (gastroesophageal reflux disease)   . Hypertension   . Thyroid disease    Past Surgical History:  Past Surgical History:  Procedure Laterality Date  . DENTAL SURGERY    . IR ANGIOGRAM SELECTIVE EACH ADDITIONAL VESSEL  03/29/2019  . IR ANGIOGRAM  SELECTIVE EACH ADDITIONAL VESSEL  03/29/2019  . IR ANGIOGRAM SELECTIVE EACH ADDITIONAL VESSEL  03/29/2019  . IR ANGIOGRAM SELECTIVE EACH ADDITIONAL VESSEL  03/29/2019  . IR ANGIOGRAM VISCERAL SELECTIVE  03/29/2019  . IR ANGIOGRAM VISCERAL SELECTIVE  03/29/2019  . IR US GUIDE VASC ACCESS RIGHT  03/29/2019    Assessment & Plan Clinical Impression: Rebecca Bruce is a 59 year old female with history of MR with depression/anxiety was originally admitted from her group home on 03/09/2019 with sepsis due to UTI and Covid PNA. She was treated with dexamethasone and remdesivir and was noted to be nonverbal with whole body stiffness and unresponsiveness since admission. 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 Per review of records--family reported that patient started regressing around age 75-13 and was placed in a group home after her mother passed away. She did require intubation from 12/4-12/14 due to VDRF and hospital course significant for hypotension with a BLA on 12/17 due to spontaneous hemoperitoneum LUQ of unknown etiology. She was transfused with 4 total PRBC, anticoagulation discontinued and conservative care recommended by [facility]. 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   EEG done revealing severe diffuse encephalopathy.LP showed mildly elevated protein without oligoclonal bands and was negative for anti--GAD antibodies. MRI brain 12/3 and repeat of 12/21 due to persistent encephalopathy did not show any acute changes.Thiamine recommended due to possible malnutrition. CT cervical spine done due to neck stiffness and showed multilevel stenosis. Dr. Trenton Gammon question myelopathy contributing to  rigidity and flexor withdrawal but no  surgical needs indicated at this time. Dr. Saralyn Pilar felt that encephalopathy multifactorial in someone with " a poor prodrome"and prolonged immobility from intubation which was likely playing a role in her current weakness. He recommended supportive care as anticipated weeks to months for recovery. Speech therapy evaluated patient who was placed on modified diet however due to poor p.o. intake diet was advanced to dysphagia 3 thins in the hope of stimulating appetite. Therapy ongoing and patient is showing improvement in participation with activity with caregiver from her group home providing encouragement. She continues to be severely debilitated. CIR recommended for follow-up therapy.  Patient transferred to CIR on 04/21/2019 .    Patient currently requires max with basic self-care skills secondary to muscle weakness, decreased cardiorespiratoy endurance, decreased initiation, decreased attention, decreased awareness, decreased problem solving, decreased safety awareness, decreased memory and delayed processing and decreased sitting balance, decreased standing balance, decreased postural control and decreased balance strategies.  Prior to hospitalization, patient could complete ADLs with modified independent .  Patient will benefit from skilled intervention to decrease level of assist with basic self-care skills prior to discharge home with care partner.  Anticipate patient will require 24 hour supervision and follow up home health.  OT - End of Session Activity Tolerance: Tolerates 10 - 20 min activity with multiple rests Endurance Deficit: Yes Endurance Deficit Description: generalized weakness OT Assessment Rehab Potential (ACUTE ONLY): Fair OT Patient demonstrates impairments in the following area(s): Balance;Perception;Behavior;Safety;Cognition;Edema;Endurance;Motor;Nutrition OT Basic ADL's Functional Problem(s): Eating;Grooming;Bathing;Dressing;Toileting OT Transfers Functional Problem(s):  Toilet;Tub/Shower OT Additional Impairment(s): None OT Plan OT Intensity: Minimum of 1-2 x/day, 45 to 90 minutes OT Frequency: 5 out of 7 days OT Duration/Estimated Length of Stay: 2-3 weeks OT Treatment/Interventions: Balance/vestibular training;Discharge planning;Pain management;Self Care/advanced ADL retraining;Therapeutic Activities;UE/LE Coordination activities;Therapeutic Exercise;Functional mobility training;Cognitive remediation/compensation;Disease mangement/prevention;Skin care/wound managment;Patient/family education;Community reintegration;DME/adaptive equipment instruction;UE/LE Strength taining/ROM;Wheelchair propulsion/positioning OT Self Feeding Anticipated Outcome(s): set up assist OT Basic Self-Care Anticipated Outcome(s): (S) OT Toileting Anticipated Outcome(s): (S) OT Bathroom Transfers Anticipated Outcome(s): (S) OT Recommendation Patient destination: (group home) Follow Up Recommendations: Home health OT Equipment Recommended: To be determined   Skilled Therapeutic Intervention Skilled OT evaluation completed. Pt with significant (very close to baseline) cognitive deficits, dx intellectual disability, that mainly limited eval today. Per prior staff reports pt participates much better with familiar caregiver present. With extensive increased time and therapeutic rapport building pt able to come EOB with mod A. Mod-max A overall for all ADLs. Care coordination completed with pt's caregiver and future session with ensure she is present. Pt left EOB with nursing present.   OT Evaluation Precautions/Restrictions  Precautions Precautions: Fall Precaution Comments: intellectual disability at baseline Restrictions Weight Bearing Restrictions: No General Chart Reviewed: Yes PT Missed Treatment Reason: Patient unwilling to participate Family/Caregiver Present: No Vital Signs   Pain Pain Assessment Pain Scale: 0-10 Pain Score: 0-No pain Home Living/Prior  Functioning Home Living Available Help at Discharge: Available 24 hours/day, Personal care attendant Type of Home: Group Home Home Access: Ramped entrance, Level entry Home Layout: One level  Lives With: Other (Comment)(lives in group home w/ caregivers) IADL History Homemaking Responsibilities: No(pt assisted in packing lunches and cleaning) Prior Function Level of Independence: Independent with basic ADLs, Needs assistance with homemaking, Independent with gait Driving: No Comments: Pt walked without an AD; able to bath and dress herself; pt loves to vacuum; sweep; clean and dance; has a boyfriend named [employer] Baseline Vision/History: No visual deficits Patient Visual  Report: No change from baseline Vision Assessment?: No apparent visual deficits Perception  Perception: Impaired Comments: Difficult to assess 2/2 cognition Praxis Praxis: Impaired Praxis Impairment Details: Ideation;Perseveration Praxis-Other Comments: Pt very perseverative on several questions, very confused Cognition Overall Cognitive Status: Difficult to assess(caregiver not present. States over the phone that pt is more confused than usual.) Arousal/Alertness: Awake/alert Orientation Level: Nonverbal/unable to assess Year: (unable) Month: (unable) Day of Week: (unable) Memory: Impaired(grossly impaired, likely at baseline with memory) Immediate Memory Recall: (unable to complete- pt with baseline moderate ID) Attention: Focused Focused Attention: Impaired Focused Attention Impairment: Verbal basic Awareness: Impaired Awareness Impairment: Intellectual impairment Problem Solving: Impaired Problem Solving Impairment: Verbal basic Executive Function: (all impaired at baseline) Behaviors: Impulsive;Poor frustration tolerance;Perseveration;Verbal agitation Safety/Judgment: Impaired Sensation Sensation Light Touch: Appears Intact Coordination Gross Motor Movements are Fluid and Coordinated:  No Fine Motor Movements are Fluid and Coordinated: No Coordination and Movement Description: Generalized weakness present. Motor  Motor Motor: Within Functional Limits Motor - Skilled Clinical Observations: generalized weakness Mobility  Bed Mobility Bed Mobility: Rolling Right;Supine to Sit Rolling Right: Moderate Assistance - Patient 50-74% Supine to Sit: Moderate Assistance - Patient 50-74%  Trunk/Postural Assessment  Cervical Assessment Cervical Assessment: Within Functional Limits Thoracic Assessment Thoracic Assessment: Within Functional Limits Lumbar Assessment Lumbar Assessment: Exceptions to WFL(posterior pelvic tilt) Postural Control Postural Control: Deficits on evaluation Righting Reactions: delayed Protective Responses: delayed  Balance Balance Balance Assessed: Yes Static Sitting Balance Static Sitting - Balance Support: Feet unsupported;Left upper extremity supported Static Sitting - Level of Assistance: 4: Min assist Dynamic Sitting Balance Dynamic Sitting - Balance Support: Feet unsupported;Left upper extremity supported Dynamic Sitting - Level of Assistance: 3: Mod assist;4: Min assist Sitting balance - Comments: Pt with min-mod A initially sitting before settling into a reclined posterior pelvic tilt and was able to maintain with (S) Extremity/Trunk Assessment RUE Assessment RUE Assessment: Exceptions to [facility] General Strength Comments: Very difficult to assess but pt able to bring drink to mouth and wash UB with RUE LUE Assessment LUE Assessment: Exceptions to [facility] General Strength Comments: Slightly swollen, difficult to assess     Refer to Care Plan for Long Term Goals  Recommendations for other services: None    Discharge Criteria: Patient will be discharged from OT if patient refuses treatment 3 consecutive times without medical reason, if treatment goals not met, if there is a change in medical status, if patient makes no progress towards goals or  if patient is discharged from hospital.  The above assessment, treatment plan, treatment alternatives and goals were discussed and mutually agreed upon: No family available/patient unable  Curtis Sites 04/22/2019, 12:52 PM

## 2019-04-22 NOTE — Plan of Care (Signed)
  Problem: Consults Goal: RH GENERAL PATIENT EDUCATION Description: See Patient Education module for education specifics. Outcome: Progressing Goal: Skin Care Protocol Initiated - if Braden Score 18 or less Description: If consults are not indicated, leave blank or document N/A Outcome: Progressing Goal: Nutrition Consult-if indicated Outcome: Progressing Goal: Diabetes Guidelines if Diabetic/Glucose > 140 Description: If diabetic or lab glucose is > 140 mg/dl - Initiate Diabetes/Hyperglycemia Guidelines & Document Interventions  Outcome: Progressing   Problem: RH BOWEL ELIMINATION Goal: RH STG MANAGE BOWEL WITH ASSISTANCE Description: STG Manage Bowel with  modified Assistance. Outcome: Progressing Goal: RH STG MANAGE BOWEL W/MEDICATION W/ASSISTANCE Description: STG Manage Bowel with Medication with  modified Assistance. Outcome: Progressing   Problem: RH BLADDER ELIMINATION Goal: RH STG MANAGE BLADDER WITH ASSISTANCE Description: STG Manage Bladder With modified Assistance Outcome: Progressing Goal: RH STG MANAGE BLADDER WITH MEDICATION WITH ASSISTANCE Description: STG Manage Bladder With Medication With  modified Assistance. Outcome: Progressing Goal: RH STG MANAGE BLADDER WITH EQUIPMENT WITH ASSISTANCE Description: STG Manage Bladder With Equipment With modifiedAssistance Outcome: Progressing   Problem: RH SKIN INTEGRITY Goal: RH STG SKIN FREE OF INFECTION/BREAKDOWN Description: Skin free from infection entire stay on rehab Outcome: Progressing Goal: RH STG MAINTAIN SKIN INTEGRITY WITH ASSISTANCE Description: STG Maintain Skin Integrity With Assistance. Outcome: Progressing Goal: RH STG ABLE TO PERFORM INCISION/WOUND CARE W/ASSISTANCE Description: STG Able To Perform Incision/Wound Care With modified Assistance. Outcome: Progressing   Problem: RH SAFETY Goal: RH STG ADHERE TO SAFETY PRECAUTIONS W/ASSISTANCE/DEVICE Description: STG Adhere to Safety Precautions With  modified Assistance/Device. Outcome: Progressing Goal: RH STG DECREASED RISK OF FALL WITH ASSISTANCE Description: STG Decreased Risk of Fall With modified  Assistance. Outcome: Progressing   Problem: RH PAIN MANAGEMENT Goal: RH STG PAIN MANAGED AT OR BELOW PT'S PAIN GOAL Outcome: Progressing   Problem: RH KNOWLEDGE DEFICIT GENERAL Goal: RH STG INCREASE KNOWLEDGE OF SELF CARE AFTER HOSPITALIZATION Description: Mod I Outcome: Progressing   Problem: RH Vision Goal: RH LTG Vision (Specify) Outcome: Progressing   Problem: RH Pre-functional/Other (Specify) Goal: RH LTG Pre-functional (Specify) Outcome: Progressing Goal: RH LTG Interdisciplinary (Specify) 1 Description: RH LTG Interdisciplinary (Specify)1 Outcome: Progressing Goal: RH LTG Interdisciplinary (Specify) 2 Description: RH LTG Interdisciplinary (Specify) 2  Outcome: Progressing

## 2019-04-22 NOTE — Progress Notes (Signed)
[place] PHYSICAL MEDICINE & REHABILITATION PROGRESS NOTE   Subjective/Complaints: Up with OT at bedside. Seems to have slept well. Denies pain or any breathing issues  ROS: Limited due to cognitive/behavioral    Objective:   No results found. Recent Labs    04/22/19 0602  WBC 16.1*  HGB 11.1*  HCT 35.6*  PLT 568*   Recent Labs    04/22/19 0602  NA 143  K 3.1*  CL 104  CO2 28  GLUCOSE 108*  BUN 18  CREATININE 0.95  CALCIUM 8.9   No intake or output data in the 24 hours ending 04/22/19 0917   Physical Exam: Vital Signs Blood pressure 126/83, pulse 86, temperature 97.8 F (36.6 C), temperature source Oral, resp. rate 16, weight 79.2 kg, SpO2 98 %. Constitutional: No distress . Vital signs reviewed. HEENT: EOMI, oral membranes moist Neck: supple Cardiovascular: RRR without murmur. No JVD    Respiratory: CTA Bilaterally without wheezes or rales. Normal effort    GI: BS +, non-tender, non-distended  Neurologic: Cranial nerves II through XII intact, motor strength is at leaset 4- to 4/5 in bilateral deltoid, bicep, tricep, grip, hip flexor, knee extensors, ankle dorsiflexor and plantar flexor Senses pain and light touch  Musculoskeletal: Full range of motion in all 4 extremities. No joint swelling Skin: very dry feet Psych: flat but cooperative and pleasant   Assessment/Plan: 1. Functional deficits secondary to covid encephalopathy which require 3+ hours per day of interdisciplinary therapy in a comprehensive inpatient rehab setting.  Physiatrist is providing close team supervision and 24 hour management of active medical problems listed below.  Physiatrist and rehab team continue to assess barriers to discharge/monitor patient progress toward functional and medical goals  Care Tool:  Bathing              Bathing assist       Upper Body Dressing/Undressing Upper body dressing        Upper body assist      Lower Body Dressing/Undressing Lower  body dressing      What is the patient wearing?: Incontinence brief     Lower body assist Assist for lower body dressing: Dependent - Patient 0%     Toileting Toileting    Toileting assist Assist for toileting: Dependent - Patient 0%     Transfers Chair/bed transfer  Transfers assist     Chair/bed transfer assist level: Moderate Assistance - Patient 50 - 74%     Locomotion Ambulation   Ambulation assist              Walk 10 feet activity   Assist           Walk 50 feet activity   Assist           Walk 150 feet activity   Assist           Walk 10 feet on uneven surface  activity   Assist           Wheelchair     Assist               Wheelchair 50 feet with 2 turns activity    Assist            Wheelchair 150 feet activity     Assist          Blood pressure 126/83, pulse 86, temperature 97.8 F (36.6 C), temperature source Oral, resp. rate 16, weight 79.2 kg, SpO2 98 %.  Medical Problem  List and Plan: 1. Debility and encephalopathy secondary to Covid 19 -patient may shower -ELOS/Goals:3-4wks, Min A Mobility and ADL goals 2. Antithrombotics: -DVT/anticoagulation:Mechanical:Sequential compression devices, below kneeBilateral lower extremities. Increase activity level/mobility. -antiplatelet therapy: N/A 3. Pain Management:Was on Gabapentin 300 mg/hs PTA. Tylenol prn for now. 4. Mood:Will need consistent schedule/familiar faces to help with security/compliance -antipsychotic agents: N/A 5. Neuropsych: This patientis notcapable of making decisions on herown behalf.  6. Skin/Wound Care:Routine pressure relief measure.  -add eucerin cream for dry feet 7. Fluids/Electrolytes/Nutrition:encourage PO  I personally reviewed the patient's labs today.    -replete K+ 3.1 1/11 8. Multifactorial encephalopathy post Covid: Catatonia has resolved.    -observe for episodic agitation  -question how close to baseline she is cognitively.   9. ABLA: Has been transfused with 4 units PRBC. H/H stable. Continue to monitor with serial checks.  10. Malnutrition: Intake limited to certain foods--offer supplements thorough out the day.   -will add megace as appetite stimulant 11. Multilevel Cervical stenosis: Follow up with Dr. Annette Stable after discharge.  12. H/o major depressive disorder: home paxil resumed   13. Prediabetes: BS reasonable--glucose normal today 14. Elevated LFT's: trending down 11/1 15. Persistent leukocytosis: 11/1 16.1  -no active signs of infection, afebrile, urine clear  -recheck cbc later this week    LOS: 1 days A FACE TO FACE EVALUATION WAS PERFORMED  [name] 04/22/2019, 9:17 AM

## 2019-04-22 NOTE — Evaluation (Signed)
Physical Therapy Assessment and Plan  Patient Details  Name: Rebecca Bruce MRN: [id number] Date of Birth: [birth year]  PT Diagnosis: Abnormal posture, Cognitive deficits, Difficulty walking, Impaired cognition and Muscle weakness Rehab Potential: Good ELOS: 2-3 weeks   Today's Date: 04/22/2019 PT Individual Time: 3086-5784 AND 1230-1245 PT Individual Time Calculation (min): 15 min  AND 15 min  Problem List:  Patient Active Problem List   Diagnosis Date Noted  . Encephalopathy 04/21/2019  . FTT (failure to thrive) in adult 04/09/2019  . Acute blood loss anemia   . Hemorrhagic shock (HCC)   . Pressure injury of skin 03/28/2019  . Encounter for intubation   . Catatonia   . Acute respiratory failure with hypoxemia (HCC)   . COVID-19 virus detected   . Acute metabolic encephalopathy 03/11/2019  . Acute cystitis without hematuria   . COVID-19 virus infection   . Altered mental state 03/09/2019  . Sepsis secondary to UTI (HCC) 03/09/2019  . 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 Hyperglycemia 07/13/2018  . Pre-diabetes 01/05/2017  . Other symptoms and signs involving cognitive functions and awareness 09/01/2015  . Dyslipidemia 07/02/2015  . History of hyperthyroidism 07/02/2015  . History of radioactive iodine thyroid ablation 07/02/2015  . Hypothyroidism (acquired) 07/02/2015  . Obesity 07/02/2015  . Hypertension, benign 07/02/2015  . Anxiety 07/02/2015  . Vitamin D deficiency 07/02/2015  . Insomnia 07/02/2015  . OA (osteoarthritis) of knee 07/02/2015  . GERD (gastroesophageal reflux disease) 07/02/2015  . Hyperlipidemia, unspecified 07/02/2015    Past Medical History:  Past Medical History:  Diagnosis Date  . GERD (gastroesophageal reflux disease)   . Hypertension   . Thyroid disease    Past Surgical History:  Past Surgical History:  Procedure Laterality Date  . DENTAL SURGERY    . IR ANGIOGRAM SELECTIVE EACH ADDITIONAL VESSEL  03/29/2019  . IR  ANGIOGRAM SELECTIVE EACH ADDITIONAL VESSEL  03/29/2019  . IR ANGIOGRAM SELECTIVE EACH ADDITIONAL VESSEL  03/29/2019  . IR ANGIOGRAM SELECTIVE EACH ADDITIONAL VESSEL  03/29/2019  . IR ANGIOGRAM VISCERAL SELECTIVE  03/29/2019  . IR ANGIOGRAM VISCERAL SELECTIVE  03/29/2019  . IR US GUIDE VASC ACCESS RIGHT  03/29/2019    Assessment & Plan Clinical Impression: Patient is a 59 year old female with history of MR with depression/anxiety was originally admitted from her group home on 03/09/2019 with sepsis due to UTI and Covid PNA. She was treated with dexamethasone and remdesivir and was noted to be nonverbal with whole body stiffness and unresponsiveness since admission. 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 Per review of records--family reported that patient started regressing around age 16-13 and was placed in a group home after her mother passed away. She did require intubation from 12/4-12/14 due to VDRF and hospital course significant for hypotension with a BLA on 12/17 due to spontaneous hemoperitoneum LUQ of unknown etiology. She was transfused with 4 total PRBC, anticoagulation discontinued and conservative care recommended by [facility]. 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   EEG done revealing severe diffuse encephalopathy.LP showed mildly elevated protein without oligoclonal bands and was negative for anti--GAD antibodies. MRI brain 12/3 and repeat of 12/21 due to persistent encephalopathy did not show any acute changes.Thiamine recommended due to possible malnutrition. CT cervical spine done due to neck stiffness and showed multilevel stenosis. Dr. Dutch Quint question myelopathy contributing  to rigidity and flexor withdrawal but no surgical  needs indicated at this time. Dr. Luisa Hart felt that encephalopathy multifactorial in someone with " a poor prodrome"and prolonged immobility from intubation which was likely playing a role in her current weakness. He recommended supportive care as anticipated weeks to months for recovery. Speech therapy evaluated patient who was placed on modified diet however due to poor p.o. intake diet was advanced to dysphagia 3 thins in the hope of stimulating appetite. Therapy ongoing and patient is showing improvement in participation with activity with caregiver from her group home providing encouragement. She continues to be severely debilitated. CIR recommended for follow-up therapy. Patient transferred to CIR on 04/21/2019 .   Patient currently requires total with mobility secondary to muscle weakness, decreased cardiorespiratoy endurance, decreased initiation, decreased attention, decreased awareness, decreased problem solving, decreased safety awareness, decreased memory and delayed processing and decreased sitting balance and decreased balance strategies.  Prior to hospitalization, patient was independent  with mobility and lived with Other (Comment)(lives in group home w/ caregivers) in a Group Home home.  Home access is  Ramped entrance, Level entry.  Patient will benefit from skilled PT intervention to maximize safe functional mobility, minimize fall risk and decrease caregiver burden for planned discharge home with 24 hour supervision.  Anticipate patient will benefit from follow up HH at discharge.  PT - End of Session Activity Tolerance: Decreased this session Endurance Deficit: Yes Endurance Deficit Description: generalized weakness PT Assessment Rehab Potential (ACUTE/IP ONLY): Good PT Barriers to Discharge: Behavior PT Patient demonstrates impairments in the following area(s): Behavior;Endurance;Safety;Motor;Balance;Pain PT Transfers Functional Problem(s): Bed Mobility;Bed to  Chair;Car;Furniture;Floor PT Locomotion Functional Problem(s): Ambulation;Wheelchair Mobility;Stairs PT Plan PT Intensity: Minimum of 1-2 x/day ,45 to 90 minutes PT Frequency: 5 out of 7 days PT Duration Estimated Length of Stay: 2-3 weeks PT Treatment/Interventions: Ambulation/gait training;Cognitive remediation/compensation;Discharge planning;DME/adaptive equipment instruction;Functional mobility training;Pain management;Psychosocial support;Splinting/orthotics;Therapeutic Activities;UE/LE Strength taining/ROM;UE/LE Coordination activities;Wheelchair propulsion/positioning;Therapeutic Exercise;Visual/perceptual remediation/compensation;Stair training;Skin care/wound management;Neuromuscular re-education;Patient/family education;Functional electrical stimulation;Disease management/prevention;Community reintegration;Balance/vestibular training PT Transfers Anticipated Outcome(s): supervision PT Locomotion Anticipated Outcome(s): TBD - will update as participation improves PT Recommendation Follow Up Recommendations: Home health PT Patient destination: Home(group home)  Skilled Therapeutic Intervention  Session 1:  Pt sitting EOB upon arrival. Pt denied pain and was pleasant. Therapist stated name and purpose for visit. Pt declined any activity, would not state why when asked. Pt repeatedly stated "I'm not going to the hospital". Max cues to orient to environment. Engaged in conversation regarding what she does in her free time and what she enjoys. Therapist only able to gather that she likes to watch TV. Turned TV off in attempts to motivate her to participate in order to "earn" TV time.  Replaced call bell/remote w/ soft call bell (taped picture of nurse on it). Pt continued to decline any activity. She did state multiple times that she needed to use the bathroom. Therapist offered BSC use or going to toilet, pt refused both and would then state she didn't need to go. This occurred multiple times.  Pt moving extremities against gravity and would grimace when therapist lightly touched R foot - however pt declined pain. Pt attempting to scoot in bed, but unable to do so w/o help and refused therapist's assistance. Pt's caregiver not present for this session, will return later in day to see if caregiver's presence improves participation. Made RN aware and will discuss potential need to implement behavior plan w/ treatment team. Offered pt assistance to lay down in bed, however  she refused. Ended session sitting EOB, all needs in reach.   Session 2:  Pt remained sitting EOB upon arrival. Per OT, pt has been sitting up since OT session. Pt adamantly refusing to transfer or return to supine. However, this therapist and OT do believe it would be unsafe for pt to remain sitting EOB for a significant amount of time. Pt required total assist +2 to return to supine for safety. Pt then yelling out, it appeared to be pain in legs but pt reports pain is no longer there once in supine. RN present and agreeable to provide pain medication. Pt requesting to watch TV. Therapist gave choice to sit up in recliner to watch TV or lay in bed. Pt agreeable to sit up in recliner. Mod assist supine>sit. Pt then refused anything further. Total assist +2 to safely return to supine. Ended session in supine, all needs in reach.   Addendum:  Called pt's caregiver - Demetris, to discuss having her come in for all therapy sessions as pt's behavior has been known to improve w/ her there. Demetris is agreeable to this and planning to be present daily from 11am-3pm for OT/PT/SLP.   PT Evaluation Precautions/Restrictions Precautions Precautions: Fall Precaution Comments: intellectual disability at baseline Restrictions Weight Bearing Restrictions: No General PT Amount of Missed Time (min): 10 Minutes PT Missed Treatment Reason: Patient unwilling to participate Vital Signs  Pain Pain Assessment Pain Scale: 0-10 Pain Score: 0-No  pain Home Living/Prior Functioning Home Living Available Help at Discharge: Available 24 hours/day;Personal care attendant Type of Home: Group Home Home Access: Ramped entrance;Level entry Home Layout: One level  Lives With: Other (Comment)(lives in group home w/ caregivers) Prior Function Level of Independence: Independent with basic ADLs;Needs assistance with homemaking;Independent with gait Driving: No Comments: Pt walked without an AD; able to bath and dress herself; pt loves to vacuum; sweep; clean and dance; has a boyfriend named Nature conservation officer Perception: Impaired Comments: Difficult to assess 2/2 cognition Praxis Praxis: Impaired Praxis Impairment Details: Ideation;Perseveration Praxis-Other Comments: Pt very perseverative on several questions, very confused  Cognition Overall Cognitive Status: Difficult to assess(caregiver not present. States over the phone that pt is more confused than usual.) Arousal/Alertness: Awake/alert Attention: Focused Focused Attention: Impaired Focused Attention Impairment: Verbal basic Memory: Impaired(grossly impaired, likely at baseline with memory) Immediate Memory Recall: (unable to complete- pt with baseline moderate ID) Awareness: Impaired Awareness Impairment: Intellectual impairment Problem Solving: Impaired Problem Solving Impairment: Verbal basic Executive Function: (all impaired at baseline) Behaviors: Impulsive;Poor frustration tolerance;Perseveration;Verbal agitation Safety/Judgment: Impaired Sensation Sensation Light Touch: Appears Intact Coordination Gross Motor Movements are Fluid and Coordinated: No Fine Motor Movements are Fluid and Coordinated: No Coordination and Movement Description: Generalized weakness present. Motor  Motor Motor: Within Functional Limits Motor - Skilled Clinical Observations: generalized weakness  Mobility Bed Mobility Bed Mobility: Sit to Supine Rolling Right: Moderate  Assistance - Patient 50-74% Supine to Sit: Moderate Assistance - Patient 50-74% Sit to Supine: 2 Helpers Transfers Transfers: (refused) Locomotion  Gait Ambulation: No Gait Gait: No Stairs / Additional Locomotion Stairs: No Wheelchair Mobility Wheelchair Mobility: No  Trunk/Postural Assessment  Cervical Assessment Cervical Assessment: Within Functional Limits Thoracic Assessment Thoracic Assessment: Within Functional Limits Lumbar Assessment Lumbar Assessment: Exceptions to WFL(posterior pelvic tilt) Postural Control Postural Control: Deficits on evaluation Righting Reactions: delayed Protective Responses: delayed  Balance Balance Balance Assessed: Yes Static Sitting Balance Static Sitting - Balance Support: Feet unsupported;Left upper extremity supported Static Sitting - Level of Assistance: 7: Independent Dynamic  Sitting Balance Dynamic Sitting - Balance Support: Feet unsupported;Left upper extremity supported Dynamic Sitting - Level of Assistance: 4: Min assist Sitting balance - Comments: Pt with min-mod A initially sitting before settling into a reclined posterior pelvic tilt and was able to maintain with (S) Extremity Assessment  RLE Assessment RLE Assessment: Exceptions to WFL(Pt refused most of assessment, moving ankle and knees in shortened range against gravity, would not participate further) LLE Assessment LLE Assessment: Exceptions to WFL(Pt refused most of assessment, moving ankle and knees in shortened range against gravity, would not participate further)    Refer to Care Plan for Long Term Goals  Recommendations for other services: None   Discharge Criteria: Patient will be discharged from PT if patient refuses treatment 3 consecutive times without medical reason, if treatment goals not met, if there is a change in medical status, if patient makes no progress towards goals or if patient is discharged from hospital.  The above assessment, treatment plan,  treatment alternatives and goals were discussed and mutually agreed upon: No family available/patient unable  Zaquan Duffner K Calia Napp 04/22/2019, 1:01 PM

## 2019-04-23 ENCOUNTER — Inpatient Hospital Stay (HOSPITAL_COMMUNITY): Payer: Medicare Other

## 2019-04-23 ENCOUNTER — Inpatient Hospital Stay (HOSPITAL_COMMUNITY): Payer: Medicare Other | Admitting: Physical Therapy

## 2019-04-23 LAB — GLUCOSE, CAPILLARY
Glucose-Capillary: 148 mg/dL — ABNORMAL HIGH (ref 70–99)
Glucose-Capillary: 91 mg/dL (ref 70–99)
Glucose-Capillary: 102 mg/dL — ABNORMAL HIGH (ref 70–99)
Glucose-Capillary: 111 mg/dL — ABNORMAL HIGH (ref 70–99)

## 2019-04-23 MED ORDER — ENOXAPARIN SODIUM 40 MG/0.4ML ~~LOC~~ SOLN
40.0000 mg | SUBCUTANEOUS | Status: DC
  Administered 2019-04-23 – 2019-05-07 (×15): 40 mg via SUBCUTANEOUS
  Filled 2019-04-23 (×15): qty 0.4

## 2019-04-23 NOTE — Plan of Care (Signed)
  Problem: Consults Goal: Diabetes Guidelines if Diabetic/Glucose > 140 Description: If diabetic or lab glucose is > 140 mg/dl - Initiate Diabetes/Hyperglycemia Guidelines & Document Interventions  Outcome: Progressing   Problem: RH SKIN INTEGRITY Goal: RH STG SKIN FREE OF INFECTION/BREAKDOWN Description: Skin free from infection entire stay on rehab Outcome: Progressing Goal: RH STG MAINTAIN SKIN INTEGRITY WITH ASSISTANCE Description: STG Maintain Skin Integrity With Assistance. Outcome: Progressing   Problem: Consults Goal: Skin Care Protocol Initiated - if Braden Score 18 or less Description: If consults are not indicated, leave blank or document N/A Outcome: Not Progressing   Problem: RH BOWEL ELIMINATION Goal: RH STG MANAGE BOWEL WITH ASSISTANCE Description: STG Manage Bowel with  modified Assistance. Outcome: Not Progressing Goal: RH STG MANAGE BOWEL W/MEDICATION W/ASSISTANCE Description: STG Manage Bowel with Medication with  modified Assistance. Outcome: Not Progressing   Problem: RH BLADDER ELIMINATION Goal: RH STG MANAGE BLADDER WITH ASSISTANCE Description: STG Manage Bladder With modified Assistance Outcome: Not Progressing Goal: RH STG MANAGE BLADDER WITH MEDICATION WITH ASSISTANCE Description: STG Manage Bladder With Medication With  modified Assistance. Outcome: Not Progressing Goal: RH STG MANAGE BLADDER WITH EQUIPMENT WITH ASSISTANCE Description: STG Manage Bladder With Equipment With modifiedAssistance Outcome: Not Progressing   Problem: RH SKIN INTEGRITY Goal: RH STG ABLE TO PERFORM INCISION/WOUND CARE W/ASSISTANCE Description: STG Able To Perform Incision/Wound Care With modified Assistance. Outcome: Not Progressing

## 2019-04-23 NOTE — Progress Notes (Addendum)
[facility] PHYSICAL MEDICINE & REHABILITATION PROGRESS NOTE   Subjective/Complaints: Up with OT at bedside. Seems to have slept well. Denies pain or any breathing issues  ROS: Limited due to cognitive/behavioral    Objective:   No results found. Recent Labs    04/22/19 0602  WBC 16.1*  HGB 11.1*  HCT 35.6*  PLT 568*   Recent Labs    04/22/19 0602  NA 143  K 3.1*  CL 104  CO2 28  GLUCOSE 108*  BUN 18  CREATININE 0.95  CALCIUM 8.9    Intake/Output Summary (Last 24 hours) at 04/23/2019 1021 Last data filed at 04/23/2019 0855 Gross per 24 hour  Intake 340 ml  Output --  Net 340 ml     Physical Exam: Vital Signs Blood pressure 118/75, pulse 92, temperature 97.6 F (36.4 C), resp. rate 18, weight 79.2 kg, SpO2 100 %. Constitutional: No distress . Vital signs reviewed. HEENT: EOMI, oral membranes moist Neck: supple Cardiovascular: RRR without murmur. No JVD    Respiratory: CTA Bilaterally without wheezes or rales. Normal effort    GI: BS +, non-tender, non-distended  Neurologic: Cranial nerves II through XII intact, motor strength is at leaset 4- to 4/5 in bilateral deltoid, bicep, tricep, grip, hip flexor, knee extensors, ankle dorsiflexor and plantar flexor Senses pain and light touch  Musculoskeletal: Full range of motion in all 4 extremities. No joint swelling Skin: very dry feet, MASD near sacrum/buttocks Psych: flat but cooperative and pleasant   Assessment/Plan: 1. Functional deficits secondary to covid encephalopathy which require 3+ hours per day of interdisciplinary therapy in a comprehensive inpatient rehab setting.  Physiatrist is providing close team supervision and 24 hour management of active medical problems listed below.  Physiatrist and rehab team continue to assess barriers to discharge/monitor patient progress toward functional and medical goals  Care Tool:  Bathing    Body parts bathed by patient: Chest, Abdomen, Face(declined anything  peri hygiene)   Body parts bathed by helper: Right arm, Left arm, Left upper leg, Right upper leg, Right lower leg, Left lower leg     Bathing assist Assist Level: Maximal Assistance - Patient 24 - 49%     Upper Body Dressing/Undressing Upper body dressing   What is the patient wearing?: Hospital gown only    Upper body assist Assist Level: Maximal Assistance - Patient 25 - 49%    Lower Body Dressing/Undressing Lower body dressing    Lower body dressing activity did not occur: Refused What is the patient wearing?: Incontinence brief     Lower body assist Assist for lower body dressing: Dependent - Patient 0%     Toileting Toileting Toileting Activity did not occur Landscape architect and hygiene only): Refused  Toileting assist Assist for toileting: Dependent - Patient 0%     Transfers Chair/bed transfer  Transfers assist  Chair/bed transfer activity did not occur: Refused  Chair/bed transfer assist level: 2 Helpers     Locomotion Ambulation   Ambulation assist   Ambulation activity did not occur: Refused          Walk 10 feet activity   Assist  Walk 10 feet activity did not occur: Refused        Walk 50 feet activity   Assist Walk 50 feet with 2 turns activity did not occur: Refused         Walk 150 feet activity   Assist Walk 150 feet activity did not occur: Refused  Walk 10 feet on uneven surface  activity   Assist Walk 10 feet on uneven surfaces activity did not occur: Armed forces operational officer activity did not occur: Refused         Wheelchair 50 feet with 2 turns activity    Assist    Wheelchair 50 feet with 2 turns activity did not occur: Refused       Wheelchair 150 feet activity     Assist  Wheelchair 150 feet activity did not occur: Refused       Blood pressure 118/75, pulse 92, temperature 97.6 F (36.4 C), resp. rate 18, weight 79.2 kg, SpO2 100  %.  Medical Problem List and Plan: 1. Debility and encephalopathy secondary to Covid 19 -patient may shower -ELOS/Goals:3-4wks, Min A Mobility and ADL goals  -team conference today. Need caregiver from [facility] to help facilitate participation in therapy sessions 2. Antithrombotics: -DVT/anticoagulation:Mechanical:Sequential compression devices, below kneeBilateral lower extremities. Increase activity level/mobility. -antiplatelet therapy: N/A 3. Pain Management:Was on Gabapentin 300 mg/hs PTA. Tylenol prn for now. 4. Mood:  consistent schedule/familiar faces to help with security/compliance  -suspect cognition is not too far off baseline -antipsychotic agents: N/A 5. Neuropsych: This patientis notcapable of making decisions on herown behalf.  6. Skin/Wound Care:Routine pressure relief measure.  -added eucerin cream for dry feet 7. Fluids/Electrolytes/Nutrition:encourage PO  I personally reviewed the patient's labs today.    1/11 -replete K+ 3.1 1/11  1/12- recheck BMET Thursday 8. Multifactorial encephalopathy post Covid: Catatonia has resolved.   -observe for episodic agitation  -question how close to baseline she is cognitively.   9. ABLA: Has been transfused with 4 units PRBC. H/H stable. Continue to monitor with serial checks.  10. Malnutrition: Intake limited to certain foods--offer supplements thorough out the day.   1/11 -added megace as appetite stimulant  1/12- only eating 20-25% so far 11. Multilevel Cervical stenosis: Follow up with Dr. Annette Stable after discharge.  12. H/o major depressive disorder: home paxil resumed   13. Prediabetes: BS reasonable--glucose normal today 14. Elevated LFT's: trending down 11/1 15. Persistent leukocytosis: 11/1 16.1  -no active signs of infection, afebrile, urine clear  -recheck cbc Thursday    LOS: 2 days A FACE TO FACE EVALUATION WAS PERFORMED  [name] 04/23/2019, 10:21 AM

## 2019-04-23 NOTE — Plan of Care (Addendum)
Behavioral Plan   [place] Level: n/a, pt is not TBI, pt w/ ABI (metabolic/COVID encephalopathy), pt also w/ history of intellectual disability at baseline  Behavior to decrease/ eliminate:  -decrease agitation -increase participation in therapies  Changes to environment:  -use TV as an incentive for taking meds, participation, etc. -goal to move pt's room to quieter part of unit/hallway  Interventions: -build rapport prior to initiating any activity -provide w/ choices  Recommendations for interactions with patient: -maintain calm and quiet environment, however pt prefers door to remain open -does not liked to be touched, ask her to touch you first for it to be on her terms (ex, holding her hand in anticipation of performing sit>stand soon)  -likes chocolate ice cream/pudding  -responds well to positive reinforcement -she CAN perform bed mobility w/ very little assist if given extra time, cues, and encouragement  Attendees:  Burnard Bunting, PT, DPT Laverle Hobby, [place], LPN

## 2019-04-23 NOTE — Progress Notes (Addendum)
Speech Language Pathology Daily Session Note  Patient Details  Name: Rebecca Bruce MRN: [id number] Date of Birth: 28-Apr-1960  Today's Date: 04/23/2019 SLP Individual Time: 1346-1430 SLP Individual Time Calculation (min): 44 min  Short Term Goals: Week 1: SLP Short Term Goal 1 (Week 1): Pt will demonstrate sustained attention in 1 minute interval with max A verbal cues for redirection. SLP Short Term Goal 2 (Week 1): Pt will follow basic 1 step commands in functional tasks with 70% accuracy given max A verbal cues. SLP Short Term Goal 3 (Week 1): Pt will demonstrate orientation to place and time with max A verbal/visual cues. SLP Short Term Goal 4 (Week 1): Pt will express wants/needs at phrase level with mod A verbal cues for clarifcation of message. SLP Short Term Goal 5 (Week 1): Pt will consume dys 3 and thin liquid diet with minimal overt s/s aspiration and supervision A verbal cues for effective mastictaion and oral clearance.  Skilled Therapeutic Interventions: Skilled ST services focused on cognitive skills. SLP facilitated sustained attention and following 1 step commands in color sorting task, pt demonstrated sustained attention in 1 minute interval with max A verbal cues and unable to continue task unless SLP provided bean bag verse pt selecting bean bag. Pt demonstrated ability to verbalize at 2 word phrase level actions presented on picture cards with 60% intelligibility and 80% accuracy of correct description. Pt was unrespectable to cues to increase speech intelligibility.Caregiver arrived the last 10 minutes of treatment session and provided information about baseline abilities; 80% intelligibility at phrase level, basic problem solving bathing/dressing/doing chores, sustained attention at least 15 minutes, express wants/needs and orientated to self/place. Pt was left in room with caregiver, call bell within reach and bed alarm set. ST recommends to continue skilled ST services.    AS of note, OT expressed concerns of oral pocketing following completion of lunch tray with NT, pt removed large bolus from oral cavity. SLP provided education to NTs pertaining to the importance of checking for oral pocketing during supervision.      Pain Pain Assessment Pain Score: 0-No pain  Therapy/Group: Individual Therapy  Jarelle Ates  [facility] 04/23/2019, 4:47 PM

## 2019-04-23 NOTE — Progress Notes (Signed)
Physical Therapy Session Note  Patient Details  Name: Rebecca Bruce MRN: [id number] Date of Birth: [birth year]  Today's Date: 04/23/2019 PT Individual Time:  -      Short Term Goals: Week 1:  PT Short Term Goal 1 (Week 1): Pt will initiate transfer training PT Short Term Goal 2 (Week 1): Pt will initiate gait training PT Short Term Goal 3 (Week 1): Pt will participate in 3 hours of therapy per day  Skilled Therapeutic Interventions/Progress Updates:   Pt in supine and agreeable to therapy, no c/o pain. Pt's caregiver [facility]) present throughout session. Session focused on building rapport and building therapeutic reliance w/ pt as well as participation. Pt sitting up upright position in bed. First started by [place] ball back and forth w/ BUEs and then having pt kick beach ball off bed w/ BLEs. Pt performing both tasks w/ encouragement, positive reinforcement, and w/o physical assist or evidence of pain. Transitioned to using tossing/kicking ball as motivation to get to [place]. Both therapist and Karena Addison providing encouragement. Pt insisting on performing supine>sit by herself. Therapist provided step-by-step verbal and visual cues for technique and pt able to get to OOB w/ only min assist from [place]. Dee provided HHA to scoot to EOB. Pt very excited about this. Tossed ball in seated and kicked ball back and forth w/ all extremities w/ close supervision, no overt LOB. Pt appears to have increased strength/motor control on LLE vs RLE but able to achieve almost equal motion w/ encouragement. Anticipate pt will do well w/ HHA in future. Worked on pt feeling comfortable w/ therapist holding her hand, did this at her speed w/ encouragement and discussing holding onto therapist's and Dee's hands to stand. Session began to run out of time, pt requesting to return to supine at end of session. Pt able to do so w/ supervision and significantly increased time and verbal/visual cues for technique. Ended session  in supine, all needs in reach.  Therapy Documentation Precautions:  Precautions Precautions: Fall Precaution Comments: intellectual disability at baseline Restrictions Weight Bearing Restrictions: No  Therapy/Group: Individual Therapy  Salisa Broz Clent Demark 04/23/2019, 11:55 AM

## 2019-04-23 NOTE — Progress Notes (Signed)
Occupational Therapy Session Note  Patient Details  Name: Rebecca Bruce MRN: [id number] Date of Birth: 1961-01-22  Today's Date: 04/23/2019 OT Individual Time: 1055-1200 OT Individual Time Calculation (min): 65 min   Session 2:  OT Individual Time: 1300-1325 OT Individual Time Calculation (min): 25 min    Short Term Goals: Week 1:  OT Short Term Goal 1 (Week 1): Pt will don shirt with mod A OT Short Term Goal 2 (Week 1): Pt will complete transfer to [facility] with mod A OT Short Term Goal 3 (Week 1): Pt will feed herself a meal with min-mod cueing and min A overall  Skilled Therapeutic Interventions/Progress Updates:    Pt received supine with no indications of pain. Pt much more agitated overall this session, yelling no several times with each movement therapist made. With set up assist pt completed oral care with min A at bed level. Pt was brought several coloring sheets and colored pencils. With set up assist pt completed coloring at bed level, performed in an effort to build therapeutic rapport. Pt's caregiver arrived and assisted OT in attempting to motivate pt to initiate EOB. Pt required EXTENSIVE INCREASED TIME to move EOB but was eventually able to do so with min A. Pt then required extended time again to return to supine. MAX cueing for all initiation this session. Per pt's caregiver D, pt is at/near baseline cognitively and behaviorally. Pt was left supine with all needs met bed alarm set.   Session 2: Pt received supine, lunch tray barely eaten. Observed pt's speech sounded a bit obstructed and when pt was given a spit dish she spit out a 1 in sized ball of chicken she had pocketed from lunch. Pt took several more bites of a dinner roll, requiring min cueing throughout for tongue sweep to R cheek for pocketing. At end, pt still spit out a large piece of dinner roll. Pt completed oral care with min A to ensure oral hygiene. Pt was left supine with all needs met, bed alarm set.    Therapy Documentation Precautions:  Precautions Precautions: Fall Precaution Comments: intellectual disability at baseline Restrictions Weight Bearing Restrictions: No   Therapy/Group: Individual Therapy  Curtis Sites 04/23/2019, 6:50 AM

## 2019-04-23 NOTE — Patient Care Conference (Signed)
Inpatient RehabilitationTeam Conference and Plan of Care Update Date: 04/23/2019   Time: 10:15 AM    Patient Name: Rebecca Bruce      Medical Record Number: [id number]  Date of Birth: 1960-10-09 Sex: Female         Room/Bed: 4M01C/4M01C-01 Payor Info: Payor: MEDICARE / Plan: MEDICARE PART A AND B / Product Type: *No Product type* /    Admit Date/Time:  04/21/2019  5:20 PM  Primary Diagnosis:  <principal problem not specified>  Patient Active Problem List   Diagnosis Date Noted  . Encephalopathy 04/21/2019  . FTT (failure to thrive) in adult 04/09/2019  . Acute blood loss anemia   . Hemorrhagic shock ([place])   . Pressure injury of skin 03/28/2019  . Encounter for intubation   . Catatonia   . Acute respiratory failure with hypoxemia ([place])   . COVID-19 virus detected   . Acute metabolic encephalopathy 99991111  . Acute cystitis without hematuria   . COVID-19 virus infection   . Altered mental state 03/09/2019  . Sepsis secondary to UTI ([place]) 03/09/2019  . 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 Hyperglycemia 07/13/2018  . Pre-diabetes 01/05/2017  . Other symptoms and signs involving cognitive functions and awareness 09/01/2015  . Dyslipidemia 07/02/2015  . History of hyperthyroidism 07/02/2015  . History of radioactive iodine thyroid ablation 07/02/2015  . Hypothyroidism (acquired) 07/02/2015  . Obesity 07/02/2015  . Hypertension, benign 07/02/2015  . Anxiety 07/02/2015  . Vitamin D deficiency 07/02/2015  . Insomnia 07/02/2015  . OA (osteoarthritis) of knee 07/02/2015  . GERD (gastroesophageal reflux disease) 07/02/2015  . Hyperlipidemia, unspecified 07/02/2015    Expected Discharge Date: Expected Discharge Date: (TBD)  Team Members Present: Physician leading conference: Dr. Alger Simons Social Worker Present: Lennart Pall, LCSW Nurse Present: Other (comment)(Inka Jamison Oka, LPN) Case Manager: Karene Fry, RN PT Present: Burnard Bunting, PT OT Present: Laverle Hobby, OT SLP Present: Charolett Bumpers, SLP PPS Coordinator present : Gunnar Fusi, Novella Olive, PT     Current Status/Progress Goal Weekly Team Focus  Bowel/Bladder   Incontinent of B/B  Baseline toileting pattern  Assess QS/PRN and provide incontinent care and meet toiletin needs   Swallow/Nutrition/ Hydration   dys 3 and thin, min A  Mod I  swallow strategies, limit distraction, lingual sweeps for pocekting and increas PO inatake   ADL's   mod-max A overall, limited by behavior and cognition  (S) overall  participation, ADL retraining, functional activity tolerance, ADL transfers   Mobility   Mod assist bed mobility, eval very limited by behavior  supervision bed mobility and transfers, will add gait goal as participation improves  participation, initiating behavior plan, OOB tolerance, initiating gait and transfers   Communication   word/phrase level, preservative and reudced ability to logically express wants/needs  Supervision A  speech intelligbility phrase level and expressing wants/needs   Safety/Cognition/ Behavioral Observations  Max A  Suepervision A  follow 1 step direction, sustained attention and orientation   Pain   denies pain     QS/PRN assessment , difficult to assess due to patient cognitive state   Skin   Skin intact,,Stage 2 -buttock -foam dressing , and stage to right upper thigh no drainge dressing applied     Assess QS/PRN    Rehab Goals Patient on target to meet rehab goals: Yes *See Care Plan and progress notes for long and short-term goals.     Barriers to Discharge  Current Status/Progress Possible Resolutions Date Resolved   Nursing  Other (comments)               PT  Behavior                 OT                  SLP Other (comments)(baseline ID)              SW                Discharge Planning/Teaching Needs:  Pt to return to group home if able to reach supervision goals.  Teaching to be completed with group home staff.   Team  Discussion: 59 yo with MR/learning disability, covid encephalopathy, behavioral issues, agitated, delusional, monitoring WBC/nutrition/anemia.  RN low appetite, inc B/B, MASD.  OT mod/max overall, caregiver from group home to come to therapy sessions, meeting 11:45 for behavior.  PT tot +2, S transfer goals.  SLP limited eval, talked to caregiver, cog max A, comm work on phrase/word level, min A with D3thin.    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 poor PO intake, anemic Weekly Focus/Goal: improve mood/participation, increase nutritional status.  Barriers to Discharge: Behavior   Possible Resolutions to Barriers: care giver from group home to help bolster participation in therapy   Continued Need for Acute Rehabilitation Level of Care: The patient requires daily medical management by a physician with specialized training in physical medicine and rehabilitation for the following reasons: Direction of a multidisciplinary physical rehabilitation program to maximize functional independence : Yes Medical management of patient stability for increased activity during participation in an intensive rehabilitation regime.: Yes Analysis of laboratory values and/or radiology reports with any subsequent need for medication adjustment and/or medical intervention. : Yes   I attest that I was present, lead the team conference, and concur with the assessment and plan of the team.   Retta Diones 04/23/2019, 7:30 PM   Team conference was held via web/ teleconference due to [place] - 19

## 2019-04-24 ENCOUNTER — Inpatient Hospital Stay (HOSPITAL_COMMUNITY): Payer: Medicare Other | Admitting: Speech Pathology

## 2019-04-24 ENCOUNTER — Inpatient Hospital Stay (HOSPITAL_COMMUNITY): Payer: Medicare Other | Admitting: Physical Therapy

## 2019-04-24 ENCOUNTER — Other Ambulatory Visit: Payer: Self-pay

## 2019-04-24 ENCOUNTER — Inpatient Hospital Stay (HOSPITAL_COMMUNITY): Payer: Medicare Other

## 2019-04-24 DIAGNOSIS — R1310 Dysphagia, unspecified: Secondary | ICD-10-CM

## 2019-04-24 DIAGNOSIS — F72 Severe intellectual disabilities: Secondary | ICD-10-CM

## 2019-04-24 LAB — GLUCOSE, CAPILLARY
Glucose-Capillary: 88 mg/dL (ref 70–99)
Glucose-Capillary: 95 mg/dL (ref 70–99)
Glucose-Capillary: 92 mg/dL (ref 70–99)
Glucose-Capillary: 106 mg/dL — ABNORMAL HIGH (ref 70–99)

## 2019-04-24 MED ORDER — PAROXETINE HCL 30 MG PO TABS: 30.0000 mg | ORAL_TABLET | Freq: Every day | ORAL | 2 refills | Status: DC

## 2019-04-24 NOTE — Progress Notes (Signed)
Speech Language Pathology Daily Session Note  Patient Details  Name: Rebecca Bruce MRN: [id number] Date of Birth: 08-18-1960  Today's Date: 04/24/2019 SLP Individual Time: 1300-1355 SLP Individual Time Calculation (min): 55 min  Short Term Goals: Week 1: SLP Short Term Goal 1 (Week 1): Pt will demonstrate sustained attention in 1 minute interval with max A verbal cues for redirection. SLP Short Term Goal 2 (Week 1): Pt will follow basic 1 step commands in functional tasks with 70% accuracy given max A verbal cues. SLP Short Term Goal 3 (Week 1): Pt will demonstrate orientation to place and time with max A verbal/visual cues. SLP Short Term Goal 4 (Week 1): Pt will express wants/needs at phrase level with mod A verbal cues for clarifcation of message. SLP Short Term Goal 5 (Week 1): Pt will consume dys 3 and thin liquid diet with minimal overt s/s aspiration and supervision A verbal cues for effective mastictaion and oral clearance.  Skilled Therapeutic Interventions: Skilled treatment session focused on dysphagia and cognitive goals. SLP facilitated session by providing skilled observation with lunch meal of Dys. 3 textures with thin liquids. Patient with minimal PO intake but demonstrated significant pocketing despite Max A multimodal cues. With extra time, patient eventually expectorated bolus. Question if limited PO intake and pocketing may be related to patient potentially losing her sense of taste which may have not fully returned. Recommend patient downgrade to Dys. 2 textures to minimize pocketing. SLP also facilitated session by providing Max A verbal cues for sustained attention to a basic cognitive task for ~15 minutes and for basic problem solving. Patient left upright sitting EOB with caregiver present and alarm on. Continue with current plan of care.      Pain No/Denies Pain   Therapy/Group: Individual Therapy  Rebecca Bruce 04/24/2019, 4:26 PM

## 2019-04-24 NOTE — Progress Notes (Signed)
Occupational Therapy Session Note  Patient Details  Name: Rebecca Bruce MRN: [id number] Date of Birth: 1961-02-27  Today's Date: 04/24/2019 OT Individual Time: 1110-1205 OT Individual Time Calculation (min): 55 min    Short Term Goals: Week 1:  OT Short Term Goal 1 (Week 1): Pt will don shirt with mod A OT Short Term Goal 2 (Week 1): Pt will complete transfer to [facility] with mod A OT Short Term Goal 3 (Week 1): Pt will feed herself a meal with min-mod cueing and min A overall  Skilled Therapeutic Interventions/Progress Updates:    Pt was received supine with no c/o pain. Caregiver not present for entire session. Improved session overall for participation and initiation. Pt completed bed mobility to EOB with min A. Pt still requiring mod-max cueing for initiation 2/2 sustained attention deficits. Pt sat EOB for majority of session with no LOB, improved upright posture and postural control overall. Pt completed UB bathing with max A overall. Pt with difficulty following command despite multimodal cueing. Pt stood from EOB with recliner placed directly in front of pt to encourage anterior weight shift and to provide stability for BUE to decrease anxiety. Pt stood 5x with ~60% hip extension with mod A. Total A for peri hygiene and incontinent BM present. Attempted to complete transfer to recliner but pt would not initiate and time restriction forced return to bed instead. Pt was left supine with all needs met, bed alarm set.   Therapy Documentation Precautions:  Precautions Precautions: Fall Precaution Comments: intellectual disability at baseline Restrictions Weight Bearing Restrictions: No   Therapy/Group: Individual Therapy  Curtis Sites 04/24/2019, 12:35 PM

## 2019-04-24 NOTE — Progress Notes (Signed)
[place] PHYSICAL MEDICINE & REHABILITATION PROGRESS NOTE   Subjective/Complaints: Seems to have slept well. Denies pain, constipation, or any breathing issues  ROS: Limited due to cognitive/behavioral    Objective:   No results found. Recent Labs    04/22/19 0602  WBC 16.1*  HGB 11.1*  HCT 35.6*  PLT 568*   Recent Labs    04/22/19 0602  NA 143  K 3.1*  CL 104  CO2 28  GLUCOSE 108*  BUN 18  CREATININE 0.95  CALCIUM 8.9    Intake/Output Summary (Last 24 hours) at 04/24/2019 0908 Last data filed at 04/24/2019 0738 Gross per 24 hour  Intake 698 ml  Output --  Net 698 ml     Physical Exam: Vital Signs Blood pressure 100/84, pulse 75, temperature 98.1 F (36.7 C), resp. rate 20, weight 79.2 kg, SpO2 97 %. Constitutional: No distress . Vital signs reviewed. HEENT: EOMI, oral membranes moist Neck: supple Cardiovascular: RRR without murmur. No JVD    Respiratory: CTA Bilaterally without wheezes or rales. Normal effort    GI: BS +, non-tender, non-distended  Neurologic: Cranial nerves II through XII intact, motor strength is at leaset 4- to 4/5 in bilateral deltoid, bicep, tricep, grip, hip flexor, knee extensors, ankle dorsiflexor and plantar flexor Senses pain and light touch  Musculoskeletal: Full range of motion in all 4 extremities. No joint swelling Skin: very dry feet, MASD near sacrum/buttocks Psych: flat but cooperative and pleasant   Assessment/Plan: 1. Functional deficits secondary to covid encephalopathy which require 3+ hours per day of interdisciplinary therapy in a comprehensive inpatient rehab setting.  Physiatrist is providing close team supervision and 24 hour management of active medical problems listed below.  Physiatrist and rehab team continue to assess barriers to discharge/monitor patient progress toward functional and medical goals  Care Tool:  Bathing    Body parts bathed by patient: Chest, Abdomen, Face(declined anything peri  hygiene)   Body parts bathed by helper: Right arm, Left arm, Left upper leg, Right upper leg, Right lower leg, Left lower leg     Bathing assist Assist Level: Maximal Assistance - Patient 24 - 49%     Upper Body Dressing/Undressing Upper body dressing   What is the patient wearing?: Hospital gown only    Upper body assist Assist Level: Dependent - Patient 0%    Lower Body Dressing/Undressing Lower body dressing    Lower body dressing activity did not occur: Refused What is the patient wearing?: Incontinence brief     Lower body assist Assist for lower body dressing: 2 Helpers     Toileting Toileting Toileting Activity did not occur Landscape architect and hygiene only): Refused  Toileting assist Assist for toileting: Dependent - Patient 0%     Transfers Chair/bed transfer  Transfers assist  Chair/bed transfer activity did not occur: Refused  Chair/bed transfer assist level: Moderate Assistance - Patient 50 - 74%     Locomotion Ambulation   Ambulation assist   Ambulation activity did not occur: Refused          Walk 10 feet activity   Assist  Walk 10 feet activity did not occur: Refused        Walk 50 feet activity   Assist Walk 50 feet with 2 turns activity did not occur: Refused         Walk 150 feet activity   Assist Walk 150 feet activity did not occur: Refused         Walk 10  feet on uneven surface  activity   Assist Walk 10 feet on uneven surfaces activity did not occur: Armed forces operational officer activity did not occur: Refused         Wheelchair 50 feet with 2 turns activity    Assist    Wheelchair 50 feet with 2 turns activity did not occur: Refused       Wheelchair 150 feet activity     Assist  Wheelchair 150 feet activity did not occur: Refused       Blood pressure 100/84, pulse 75, temperature 98.1 F (36.7 C), resp. rate 20, weight 79.2 kg, SpO2 97  %.  Medical Problem List and Plan: 1. Debility and encephalopathy secondary to Covid 19 -patient may shower -ELOS/Goals:3-4wks, Min A Mobility and ADL goals  -Need caregiver from [facility] to help facilitate participation in therapy sessions 2. Antithrombotics: -DVT/anticoagulation:Mechanical:Sequential compression devices, below kneeBilateral lower extremities. Increase activity level/mobility. -antiplatelet therapy: N/A 3. Pain Management:Was on Gabapentin 300 mg/hs PTA. Tylenol prn for now. 4. Mood:  consistent schedule/familiar faces to help with security/compliance  -suspect cognition is not too far off baseline -antipsychotic agents: N/A 5. Neuropsych: This patientis notcapable of making decisions on herown behalf.  6. Skin/Wound Care:Routine pressure relief measure.  -added eucerin cream for dry feet 7. Fluids/Electrolytes/Nutrition:encourage PO, monitor for oral pocketing.   I personally reviewed the patient's labs today.    1/11 -replete K+ 3.1 1/11  1/12- recheck BMET Thursday 8. Multifactorial encephalopathy post Covid: Catatonia has resolved.   -observe for episodic agitation  -question how close to baseline she is cognitively.   9. ABLA: Has been transfused with 4 units PRBC. H/H stable. Continue to monitor with serial checks.  10. Malnutrition: Intake limited to certain foods--offer supplements thorough out the day.   1/11 -added megace as appetite stimulant  1/12- only eating 20-25% so far 11. Multilevel Cervical stenosis: Follow up with Dr. Annette Stable after discharge.  12. H/o major depressive disorder: home paxil resumed   13. Prediabetes: BS reasonable--glucose normal this week 14. Elevated LFT's: trending down 11/1 15. Persistent leukocytosis: 11/1 16.1  -no active signs of infection, afebrile, urine clear  -recheck cbc Thursday   LOS: 3 days A FACE TO FACE EVALUATION WAS PERFORMED  Lirio Bach P  Zalman Hull 04/24/2019, 9:08 AM

## 2019-04-24 NOTE — Plan of Care (Signed)
  Problem: Consults Goal: RH GENERAL PATIENT EDUCATION Description: See Patient Education module for education specifics. Outcome: Progressing Goal: Skin Care Protocol Initiated - if Braden Score 18 or less Description: If consults are not indicated, leave blank or document N/A Outcome: Progressing Goal: Nutrition Consult-if indicated Outcome: Progressing Goal: Diabetes Guidelines if Diabetic/Glucose > 140 Description: If diabetic or lab glucose is > 140 mg/dl - Initiate Diabetes/Hyperglycemia Guidelines & Document Interventions  Outcome: Progressing   Problem: RH BOWEL ELIMINATION Goal: RH STG MANAGE BOWEL WITH ASSISTANCE Description: STG Manage Bowel with  modified Assistance. Outcome: Progressing Goal: RH STG MANAGE BOWEL W/MEDICATION W/ASSISTANCE Description: STG Manage Bowel with Medication with  modified Assistance. Outcome: Progressing   Problem: RH BLADDER ELIMINATION Goal: RH STG MANAGE BLADDER WITH ASSISTANCE Description: STG Manage Bladder With modified Assistance Outcome: Progressing Goal: RH STG MANAGE BLADDER WITH MEDICATION WITH ASSISTANCE Description: STG Manage Bladder With Medication With  modified Assistance. Outcome: Progressing Goal: RH STG MANAGE BLADDER WITH EQUIPMENT WITH ASSISTANCE Description: STG Manage Bladder With Equipment With modifiedAssistance Outcome: Progressing   Problem: RH SKIN INTEGRITY Goal: RH STG SKIN FREE OF INFECTION/BREAKDOWN Description: Skin free from infection entire stay on rehab Outcome: Progressing Goal: RH STG MAINTAIN SKIN INTEGRITY WITH ASSISTANCE Description: STG Maintain Skin Integrity With Assistance. Outcome: Progressing Goal: RH STG ABLE TO PERFORM INCISION/WOUND CARE W/ASSISTANCE Description: STG Able To Perform Incision/Wound Care With modified Assistance. Outcome: Not Progressing   Problem: RH SAFETY Goal: RH STG ADHERE TO SAFETY PRECAUTIONS W/ASSISTANCE/DEVICE Description: STG Adhere to Safety Precautions With  modified Assistance/Device. Outcome: Progressing Goal: RH STG DECREASED RISK OF FALL WITH ASSISTANCE Description: STG Decreased Risk of Fall With modified  Assistance. Outcome: Progressing   Problem: RH PAIN MANAGEMENT Goal: RH STG PAIN MANAGED AT OR BELOW PT'S PAIN GOAL Outcome: Progressing   Problem: RH KNOWLEDGE DEFICIT GENERAL Goal: RH STG INCREASE KNOWLEDGE OF SELF CARE AFTER HOSPITALIZATION Description: Mod I Outcome: Progressing

## 2019-04-24 NOTE — Progress Notes (Signed)
Physical Therapy Session Note  Patient Details  Name: Rebecca Bruce MRN: [id number] Date of Birth: 04/08/1961  Today's Date: 04/24/2019 PT Individual Time: 1400-1500 PT Individual Time Calculation (min): 60 min   Short Term Goals: Week 1:  PT Short Term Goal 1 (Week 1): Pt will initiate transfer training PT Short Term Goal 2 (Week 1): Pt will initiate gait training PT Short Term Goal 3 (Week 1): Pt will participate in 3 hours of therapy per day  Skilled Therapeutic Interventions/Progress Updates:   Pt sitting EOB w/ caregiver upon arrival, agreeable to therapy and denies pain. Session focused on attempting to stand and thus transfer to recliner. Pt set-up w/ transfer and both therapist and caregiver spent 40+ minutes attempting to encourage pt to transfer to work on OOB tolerance and strength w/ functional mobility. Offered multiple techniques and went back and forth with pt regarding if she wanted physical assist or not. Pt appears to have deficits in sequencing, motor planning, and attention to task. Needed frequent cues to attend to task, max encouragement for initiation, and step-by-step motor planning cues. Pt at one point was able to reach ~25% stance w/o assist by pulling up on armrests of a chair in front of her, but she adamantly refused anything further, including returning to supine. Pt did appear to feel more confident and safe w/ a physical block to her knees and something to pull up on in front of her. Set-up stedy and pt agreeable to attempt to stand in stedy, pt even asking for bed to be made higher to make sit>stand easier. Both therapist and caregiver attempting to assist pt from behind, however pt yelling out to stop. Pt would deny pain but repeatedly stated "I'm scared". Gave multiple rest breaks w/ encouragement to try again. Urine was then noted to be overflowing from brief, bed sheets were quickly soaked. Used this in attempts to motivate pt to stand, however pt actively  resisting against therapist and caregiver's physical help to stand. Pt then at risk of falling 2/2 bottom sliding forward on bed. Therapist and caregiver dependently transferred pt to supine for safety. Pt continued to yell out "no" but would then deny any pain or discomfort w/ mobility. Both therapist and caregiver agree to give pt a mental and emotional break prior to any attempts at pericare. Pt resting comfortably in supine, watching TV at end of session, all needs in reach. Made NT aware of status and situation.   Therapy Documentation Precautions:  Precautions Precautions: Fall Precaution Comments: intellectual disability at baseline Restrictions Weight Bearing Restrictions: No Vital Signs: Therapy Vitals Temp: 98.1 F (36.7 C) Pulse Rate: 90 Resp: 17 BP: 122/84 Patient Position (if appropriate): Sitting Oxygen Therapy SpO2: 100 % O2 Device: Room Air  Therapy/Group: Individual Therapy  Lizett Chowning Clent Demark 04/24/2019, 6:37 PM

## 2019-04-24 NOTE — IPOC Note (Signed)
Overall Plan of Care [facility]) Patient Details Name: Kimika Bourbon MRN: [id number] DOB: 12-03-1960  Admitting Diagnosis: <principal problem not specified>  Hospital Problems: Active Problems:   Encephalopathy     Functional Problem List: Nursing Behavior, Bladder, Bowel, Endurance, Medication Management, Motor, Nutrition, Pain, Perception, Safety, Sensory, Skin Integrity, Edema  PT Behavior, Endurance, Safety, Motor, Balance, Pain  OT Balance, Perception, Behavior, Safety, Cognition, Edema, Endurance, Motor, Nutrition  SLP Cognition  TR         Basic ADL's: OT Eating, Grooming, Bathing, Dressing, Toileting     Advanced  ADL's: OT       Transfers: PT Bed Mobility, Bed to Chair, Car, Furniture, Floor  OT Toilet, Metallurgist: PT Ambulation, Emergency planning/management officer, Stairs     Additional Impairments: OT None  SLP Swallowing, Communication, Social Cognition comprehension, expression Problem Solving, Memory, Attention, Awareness  TR      Anticipated Outcomes Item Anticipated Outcome  Self Feeding set up assist  Swallowing  Mod I   Basic self-care  (S)  Toileting  (S)   Bathroom Transfers (S)  Bowel/Bladder  moderate assistance  Transfers  supervision  Locomotion  TBD - will update as participation improves  Communication  Supervision A  Cognition  Supervision A  Pain  less than 2  Safety/Judgment  moderate assistance   Therapy Plan: PT Intensity: Minimum of 1-2 x/day ,45 to 90 minutes PT Frequency: 5 out of 7 days PT Duration Estimated Length of Stay: 2-3 weeks OT Intensity: Minimum of 1-2 x/day, 45 to 90 minutes OT Frequency: 5 out of 7 days OT Duration/Estimated Length of Stay: 2-3 weeks SLP Intensity: Minumum of 1-2 x/day, 30 to 90 minutes SLP Frequency: 3 to 5 out of 7 days SLP Duration/Estimated Length of Stay: 3-3.5 weeks   Due to the current state of emergency, patients may not be receiving their 3-hours of Medicare-mandated  therapy.   Team Interventions: Nursing Interventions Patient/Family Education, Disease Management/Prevention, Skin Care/Wound Management, Bladder Management, Pain Management, Cognitive Remediation/Compensation, Psychosocial Support, Bowel Management, Medication Management, Dysphagia/Aspiration Precaution Training, Discharge Planning  PT interventions Ambulation/gait training, Cognitive remediation/compensation, Discharge planning, DME/adaptive equipment instruction, Functional mobility training, Pain management, Psychosocial support, Splinting/orthotics, Therapeutic Activities, UE/LE Strength taining/ROM, UE/LE Coordination activities, Wheelchair propulsion/positioning, Therapeutic Exercise, Visual/perceptual remediation/compensation, Stair training, Skin care/wound management, Neuromuscular re-education, Patient/family education, Functional electrical stimulation, Disease management/prevention, Academic librarian, Training and development officer  OT Interventions Training and development officer, Discharge planning, Pain management, Self Care/advanced ADL retraining, Therapeutic Activities, UE/LE Coordination activities, Therapeutic Exercise, Functional mobility training, Cognitive remediation/compensation, Disease mangement/prevention, Skin care/wound managment, Patient/family education, Academic librarian, Engineer, drilling, UE/LE Strength taining/ROM, Wheelchair propulsion/positioning  SLP Interventions Cognitive remediation/compensation, Dysphagia/aspiration precaution training, Functional tasks, Internal/external aids, Speech/Language facilitation, Patient/family education  TR Interventions    SW/CM Interventions Discharge Planning, Psychosocial Support, Patient/Family Education   Barriers to Discharge MD  Medical stability  Nursing Other (comments)    PT Behavior    OT      SLP Other (comments)(baseline ID)    SW       Team Discharge Planning: Destination:  PT-Home(group home) ,OT- (group home) , SLP-Home Projected Follow-up: PT-Home health PT, OT-  Home health OT, SLP-24 hour supervision/assistance, Skilled Nursing facility, Home Health SLP Projected Equipment Needs: PT- , OT- To be determined, SLP-None recommended by SLP Equipment Details: PT- , OT-  Patient/family involved in discharge planning: PT- Patient unable/family or caregiver not available,  OT-Patient unable/family or caregiver not available, SLP-Patient  MD ELOS: 15-20 days Medical  Rehab Prognosis:  Excellent Assessment: The patient has been admitted for CIR therapies with the diagnosis of COVID encephalopathy. The team will be addressing functional mobility, strength, stamina, balance, safety, adaptive techniques and equipment, self-care, bowel and bladder mgt, patient and caregiver education, cognition, behavior, pain mgt, nutrition, care giver education. Goals have been set at supervision for mobility, self-care. Cognition at baseline. Mod assist with bowel/bladder.   Due to the current state of emergency, patients may not be receiving their 3 hours per day of Medicare-mandated therapy.    [name], MD, FAAPMR      See Team Conference Notes for weekly updates to the plan of care

## 2019-04-25 ENCOUNTER — Inpatient Hospital Stay (HOSPITAL_COMMUNITY): Payer: Medicare Other | Admitting: Speech Pathology

## 2019-04-25 ENCOUNTER — Inpatient Hospital Stay (HOSPITAL_COMMUNITY): Payer: Medicare Other

## 2019-04-25 ENCOUNTER — Inpatient Hospital Stay (HOSPITAL_COMMUNITY): Payer: Medicare Other | Admitting: Physical Therapy

## 2019-04-25 LAB — CBC
HCT: 34.8 % — ABNORMAL LOW (ref 36.0–46.0)
Hemoglobin: 10.9 g/dL — ABNORMAL LOW (ref 12.0–15.0)
MCH: 28.2 pg (ref 26.0–34.0)
MCHC: 31.3 g/dL (ref 30.0–36.0)
MCV: 89.9 fL (ref 80.0–100.0)
Platelets: 484 10*3/uL — ABNORMAL HIGH (ref 150–400)
RBC: 3.87 MIL/uL (ref 3.87–5.11)
RDW: 17 % — ABNORMAL HIGH (ref 11.5–15.5)
WBC: 14.2 10*3/uL — ABNORMAL HIGH (ref 4.0–10.5)
nRBC: 0 % (ref 0.0–0.2)

## 2019-04-25 LAB — BASIC METABOLIC PANEL
Anion gap: 10 (ref 5–15)
BUN: 14 mg/dL (ref 6–20)
CO2: 28 mmol/L (ref 22–32)
Calcium: 8.9 mg/dL (ref 8.9–10.3)
Chloride: 103 mmol/L (ref 98–111)
Creatinine, Ser: 0.95 mg/dL (ref 0.44–1.00)
GFR calc Af Amer: >60 mL/min (ref >60)
GFR calc non Af Amer: >60 mL/min (ref >60)
Glucose, Bld: 106 mg/dL — ABNORMAL HIGH (ref 70–99)
Potassium: 3.6 mmol/L (ref 3.5–5.1)
Sodium: 141 mmol/L (ref 135–145)

## 2019-04-25 LAB — GLUCOSE, CAPILLARY
Glucose-Capillary: 98 mg/dL (ref 70–99)
Glucose-Capillary: 106 mg/dL — ABNORMAL HIGH (ref 70–99)
Glucose-Capillary: 151 mg/dL — ABNORMAL HIGH (ref 70–99)
Glucose-Capillary: 86 mg/dL (ref 70–99)

## 2019-04-25 NOTE — Plan of Care (Signed)
  Problem: Consults Goal: RH GENERAL PATIENT EDUCATION Description: See Patient Education module for education specifics. Outcome: Progressing Goal: Skin Care Protocol Initiated - if Braden Score 18 or less Description: If consults are not indicated, leave blank or document N/A Outcome: Progressing Goal: Nutrition Consult-if indicated Outcome: Progressing Goal: Diabetes Guidelines if Diabetic/Glucose > 140 Description: If diabetic or lab glucose is > 140 mg/dl - Initiate Diabetes/Hyperglycemia Guidelines & Document Interventions  Outcome: Progressing   Problem: RH BOWEL ELIMINATION Goal: RH STG MANAGE BOWEL WITH ASSISTANCE Description: STG Manage Bowel with  modified Assistance. Outcome: Progressing Goal: RH STG MANAGE BOWEL W/MEDICATION W/ASSISTANCE Description: STG Manage Bowel with Medication with  modified Assistance. Outcome: Progressing   Problem: RH BLADDER ELIMINATION Goal: RH STG MANAGE BLADDER WITH ASSISTANCE Description: STG Manage Bladder With modified Assistance Outcome: Progressing Goal: RH STG MANAGE BLADDER WITH MEDICATION WITH ASSISTANCE Description: STG Manage Bladder With Medication With  modified Assistance. Outcome: Progressing Goal: RH STG MANAGE BLADDER WITH EQUIPMENT WITH ASSISTANCE Description: STG Manage Bladder With Equipment With modifiedAssistance Outcome: Progressing   Problem: RH SKIN INTEGRITY Goal: RH STG SKIN FREE OF INFECTION/BREAKDOWN Description: Skin free from infection entire stay on rehab Outcome: Progressing Goal: RH STG MAINTAIN SKIN INTEGRITY WITH ASSISTANCE Description: STG Maintain Skin Integrity With Assistance. Outcome: Progressing Goal: RH STG ABLE TO PERFORM INCISION/WOUND CARE W/ASSISTANCE Description: STG Able To Perform Incision/Wound Care With modified Assistance. Outcome: Progressing   Problem: RH SAFETY Goal: RH STG ADHERE TO SAFETY PRECAUTIONS W/ASSISTANCE/DEVICE Description: STG Adhere to Safety Precautions With  modified Assistance/Device. Outcome: Progressing Goal: RH STG DECREASED RISK OF FALL WITH ASSISTANCE Description: STG Decreased Risk of Fall With modified  Assistance. Outcome: Progressing   Problem: RH PAIN MANAGEMENT Goal: RH STG PAIN MANAGED AT OR BELOW PT'S PAIN GOAL Outcome: Progressing   Problem: RH KNOWLEDGE DEFICIT GENERAL Goal: RH STG INCREASE KNOWLEDGE OF SELF CARE AFTER HOSPITALIZATION Description: Mod I Outcome: Progressing

## 2019-04-25 NOTE — Progress Notes (Signed)
Speech Language Pathology Daily Session Note  Patient Details  Name: Rebecca Bruce MRN: [id number] Date of Birth: 15-Jun-1960  Today's Date: 04/25/2019 SLP Individual Time: 1300-1355 SLP Individual Time Calculation (min): 55 min  Short Term Goals: Week 1: SLP Short Term Goal 1 (Week 1): Pt will demonstrate sustained attention in 1 minute interval with max A verbal cues for redirection. SLP Short Term Goal 2 (Week 1): Pt will follow basic 1 step commands in functional tasks with 70% accuracy given max A verbal cues. SLP Short Term Goal 3 (Week 1): Pt will demonstrate orientation to place and time with max A verbal/visual cues. SLP Short Term Goal 4 (Week 1): Pt will express wants/needs at phrase level with mod A verbal cues for clarifcation of message. SLP Short Term Goal 5 (Week 1): Pt will consume dys 3 and thin liquid diet with minimal overt s/s aspiration and supervision A verbal cues for effective mastictaion and oral clearance.  Skilled Therapeutic Interventions: Skilled treatment session focused on dysphagia and cognitive goals. SLP facilitated session by providing max encouragement for PO intake.  Patient declined all solid textures and consumed several bites of Dys. 1 textures and thin liquids via straw without overt s/s of aspiration. Patient's caregiver present and also provided encouragement with minimal success. The patient's caregiver will bring a hotdog (patient's favorite food) chopped up tomorrow in hopes of maximizing PO intake. SLP also facilitated session by providing extra time and Min A verbal cues for patient to follow basic 1-step commands during a basic cognitive task. Mod-Max A verbal cues were needed for sustained attention to task due to external distractions. Patient left upright in bed with alarm on and all needs within reach. Continue with current plan of care.      Pain No/Denies Pain   Therapy/Group: Individual Therapy  Demetrus Pavao 04/25/2019, 4:19  PM

## 2019-04-25 NOTE — Progress Notes (Signed)
Social Work Assessment and Plan   Patient Details  Name: Rebecca Bruce MRN: [id number] Date of Birth: 09/09/1960  Today's Date: 04/24/2019  Problem List:  Patient Active Problem List   Diagnosis Date Noted  . Dysphagia 04/24/2019  . Encephalopathy 04/21/2019  . FTT (failure to thrive) in adult 04/09/2019  . Acute blood loss anemia   . Hemorrhagic shock (HCC)   . Pressure injury of skin 03/28/2019  . Encounter for intubation   . Catatonia   . Acute respiratory failure with hypoxemia (HCC)   . COVID-19 virus detected   . Acute metabolic encephalopathy 03/11/2019  . Acute cystitis without hematuria   . COVID-19 virus infection   . Altered mental state 03/09/2019  . Sepsis secondary to UTI (HCC) 03/09/2019  . 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 Hyperglycemia 07/13/2018  . Pre-diabetes 01/05/2017  . Other symptoms and signs involving cognitive functions and awareness 09/01/2015  . Dyslipidemia 07/02/2015  . History of hyperthyroidism 07/02/2015  . History of radioactive iodine thyroid ablation 07/02/2015  . Hypothyroidism (acquired) 07/02/2015  . Obesity 07/02/2015  . Hypertension, benign 07/02/2015  . Anxiety 07/02/2015  . Vitamin D deficiency 07/02/2015  . Insomnia 07/02/2015  . OA (osteoarthritis) of knee 07/02/2015  . GERD (gastroesophageal reflux disease) 07/02/2015  . Hyperlipidemia, unspecified 07/02/2015   Past Medical History:  Past Medical History:  Diagnosis Date  . GERD (gastroesophageal reflux disease)   . Hypertension   . Thyroid disease    Past Surgical History:  Past Surgical History:  Procedure Laterality Date  . DENTAL SURGERY    . IR ANGIOGRAM SELECTIVE EACH ADDITIONAL VESSEL  03/29/2019  . IR ANGIOGRAM SELECTIVE EACH ADDITIONAL VESSEL  03/29/2019  . IR ANGIOGRAM SELECTIVE EACH ADDITIONAL VESSEL  03/29/2019  . IR ANGIOGRAM SELECTIVE EACH ADDITIONAL VESSEL  03/29/2019  . IR ANGIOGRAM VISCERAL SELECTIVE  03/29/2019  . IR ANGIOGRAM  VISCERAL SELECTIVE  03/29/2019  . IR US GUIDE VASC ACCESS RIGHT  03/29/2019   Social History:  reports that she has never smoked. She has never used smokeless tobacco. She reports that she does not drink alcohol or use drugs.  Family / Support Systems Marital Status: Single Patient Roles: Other (Comment)(sibling) Other Supports: brother, Samar Cleaton @ [phone];  DON of group home, Ulyess Blossom @ [phone] Anticipated Caregiver: group home staff, contact is Ulyess Blossom Ability/Limitations of Caregiver: none Caregiver Availability: 24/7 Family Dynamics: Pt has lived in the group for many years.  Family with intermittent support.  Social History Preferred language: English Religion: None Cultural Background: NA Read: No Write: No Employment Status: Disabled Marine scientist Issues: none Guardian/Conservator: pt's brother, [name], is her legal guardian   Abuse/Neglect Abuse/Neglect Assessment Can Be Completed: Unable to assess, patient is non-responsive or altered mental status  Emotional Status Pt's affect, behavior and adjustment status: Patient sitting up in bed, however, with known history of MR and unable to fully engage a true assessment interview.  Caregiver from group home interacts with patient at easily and patient appears very comfortable with her.  Patient does not appear to be in any type of emotional distress, however, will monitor.  Staff from group home to be here daily while therapies are taking place to encourage patient's participation. Recent Psychosocial Issues: None Psychiatric History: Patient diagnosed with MR as well and has history of depression and anxiety. Substance Abuse History: None  Patient / Family Perceptions, Expectations & Goals Pt/Family understanding of illness & functional limitations: Family and group home staff with good  understanding of patient's debility post COVID and need for CIR to regain strength and mobility. Premorbid  pt/family roles/activities: Patient was completely independent physically in the group home.  Required supervision only. Anticipated changes in roles/activities/participation: Her goals of supervision to contact-guard, patient may require some increased physical support at least initially upon her return to the group home. Pt/family expectations/goals: Nursing director at group home hopeful patient can regain some level of functional independence prior to return.  Community Resources [employer]: Other (Comment)([facility]) Premorbid Home Care/DME Agencies: None Transportation available at discharge: Yes  Discharge Planning Living Arrangements: Group Home Support Systems: Home care staff, Other relatives Type of Residence: Group home Care Facility Name: [facility] Insurance Resources: Medicaid (specify county), [employer] Financial Resources: SSD, SSI Financial Screen Referred: No Living Expenses: Other (Comment) Money Management: Family Does the patient have any problems obtaining your medications?: No Home Management: group home staff and residents Patient/Family Preliminary Plans: Patient to return to [facility] group home with staff providing for 7 support. Social Work Anticipated Follow Up Needs: HH/OP Expected length of stay: 2 to 3 weeks  Clinical Impression 59 year old female admitted to CIR for post COVID debility.  With known history of MR as well as anxiety and depression.  Patient resides in a group home where she has been for many years and staff to be present during therapy sessions to assist with the patient's engagement.  Patient pleasant, polite but with noted intellectual and cognitive impairment.  Group home staff member provides background information for patient.  Goal is to try and reach a supervision level with ADLs and mobility and patient to return to group home.  Nathaniel Yaden 04/24/2019, 3:27 PM

## 2019-04-25 NOTE — Progress Notes (Signed)
[facility] PHYSICAL MEDICINE & REHABILITATION PROGRESS NOTE   Subjective/Complaints: Up in bed. Appears to have slept last night. Asked when "are they coming in?"  ROS: Limited due to cognitive/behavioral     Objective:   No results found. Recent Labs    04/25/19 0626  WBC 14.2*  HGB 10.9*  HCT 34.8*  PLT 484*   Recent Labs    04/25/19 0626  NA 141  K 3.6  CL 103  CO2 28  GLUCOSE 106*  BUN 14  CREATININE 0.95  CALCIUM 8.9    Intake/Output Summary (Last 24 hours) at 04/25/2019 0918 Last data filed at 04/25/2019 0800 Gross per 24 hour  Intake 268 ml  Output --  Net 268 ml     Physical Exam: Vital Signs Blood pressure 108/74, pulse 86, temperature 98.1 F (36.7 C), resp. rate 17, weight 79.2 kg, SpO2 98 %. Constitutional: No distress . Vital signs reviewed. HEENT: EOMI, oral membranes moist Neck: supple Cardiovascular: RRR without murmur. No JVD    Respiratory: CTA Bilaterally without wheezes or rales. Normal effort    GI: BS +, non-tender, non-distended  Neurologic: Cranial nerves II through XII intact, motor strength is at leaset 4- to 4/5 in bilateral deltoid, bicep, tricep, grip, hip flexor, knee extensors, ankle dorsiflexor and plantar flexor Senses pain and light touch in all 4's.  Limited insight and awareness.  Musculoskeletal: Full range of motion in all 4 extremities. No joint swelling Skin: very dry feet, MASD near sacrum/buttocks stable Psych: flat, cooperative   Assessment/Plan: 1. Functional deficits secondary to covid encephalopathy which require 3+ hours per day of interdisciplinary therapy in a comprehensive inpatient rehab setting.  Physiatrist is providing close team supervision and 24 hour management of active medical problems listed below.  Physiatrist and rehab team continue to assess barriers to discharge/monitor patient progress toward functional and medical goals  Care Tool:  Bathing    Body parts bathed by patient: Chest,  Abdomen, Face   Body parts bathed by helper: Right arm, Left arm, Left upper leg, Right upper leg, Right lower leg, Left lower leg, Front perineal area, Buttocks     Bathing assist Assist Level: Maximal Assistance - Patient 24 - 49%     Upper Body Dressing/Undressing Upper body dressing   What is the patient wearing?: Hospital gown only    Upper body assist Assist Level: Maximal Assistance - Patient 25 - 49%    Lower Body Dressing/Undressing Lower body dressing    Lower body dressing activity did not occur: Refused What is the patient wearing?: Incontinence brief     Lower body assist Assist for lower body dressing: Maximal Assistance - Patient 25 - 49%     Toileting Toileting Toileting Activity did not occur Landscape architect and hygiene only): Refused  Toileting assist Assist for toileting: Dependent - Patient 0%     Transfers Chair/bed transfer  Transfers assist  Chair/bed transfer activity did not occur: Refused  Chair/bed transfer assist level: Moderate Assistance - Patient 50 - 74%     Locomotion Ambulation   Ambulation assist   Ambulation activity did not occur: Refused          Walk 10 feet activity   Assist  Walk 10 feet activity did not occur: Refused        Walk 50 feet activity   Assist Walk 50 feet with 2 turns activity did not occur: Refused         Walk 150 feet activity  Assist Walk 150 feet activity did not occur: Refused         Walk 10 feet on uneven surface  activity   Assist Walk 10 feet on uneven surfaces activity did not occur: Armed forces operational officer activity did not occur: Refused         Wheelchair 50 feet with 2 turns activity    Assist    Wheelchair 50 feet with 2 turns activity did not occur: Refused       Wheelchair 150 feet activity     Assist  Wheelchair 150 feet activity did not occur: Refused       Blood pressure 108/74, pulse  86, temperature 98.1 F (36.7 C), resp. rate 17, weight 79.2 kg, SpO2 98 %.  Medical Problem List and Plan: 1. Debility and encephalopathy secondary to Covid 19 -patient may shower -ELOS/Goals:3-4wks, Min A Mobility and ADL goals  -caregiver from [facility] to help facilitate participation in therapy sessions 2. Antithrombotics: -DVT/anticoagulation:Mechanical:Sequential compression devices, below kneeBilateral lower extremities.   -added lovenox 40mg  daily -antiplatelet therapy: N/A 3. Pain Management:Was on Gabapentin 300 mg/hs PTA. Tylenol prn for now. 4. Mood:  consistent schedule/familiar faces to help with security/compliance  -suspect cognition is not too far off baseline -antipsychotic agents: N/A 5. Neuropsych: This patientis notcapable of making decisions on herown behalf.  6. Skin/Wound Care:Routine pressure relief measure.  -added eucerin cream for dry feet 7. Fluids/Electrolytes/Nutrition:encourage PO, monitor for oral pocketing.        1/11 -replete K+ 3.1 1/11  1/14: potassium 3.6, continue supplementation  -I personally reviewed the patient's labs today.   8. Multifactorial encephalopathy post Covid: Catatonia has resolved.   -observe for episodic agitation  -cognition likely close to baseline 9. ABLA: Has been transfused with 4 units PRBC. H/H stable. Continue to monitor with serial checks.  10. Malnutrition: Intake limited to certain foods--offer supplements thorough out the day.   1/11 -added megace as appetite stimulant  1/14- intake up to 40-50% but sporadic 11. Multilevel Cervical stenosis: Follow up with Dr. Annette Stable after discharge.  12. H/o major depressive disorder: home paxil resumed   13. Prediabetes: BS reasonable--glucose normal this week 14. Elevated LFT's: trending down 11/1 15. Persistent leukocytosis: 11/1 16.1  -1/14--->14.2   -no active signs of infection  LOS: 4 days A FACE TO FACE  EVALUATION WAS PERFORMED  [name] 04/25/2019, 9:18 AM

## 2019-04-25 NOTE — Progress Notes (Signed)
Patient refused to take her medications this morning. Therapist was able to convince her to eat half of her crushed meds in apple sauce. Patient's caretaker came and was able to feed her the rest of the medication, except for the Megace. The patient's attention and focus had to be redirected with the caretaker but she was able to follow along with the conversation appropriately. Caretaker handed the patient a spoonful of applesauce. The patient was able to hold the spoon and feed herself. Patient's caretaker stated the patient has ativan PRN prescribed at home and will bring a list of her medications.

## 2019-04-25 NOTE — Progress Notes (Signed)
Physical Therapy Session Note  Patient Details  Name: Rebecca Bruce MRN: [id number] Date of Birth: 07-Jan-1961  Today's Date: 04/25/2019 PT Individual Time: 1400-1445 PT Individual Time Calculation (min): 45 min   Short Term Goals: Week 1:  PT Short Term Goal 1 (Week 1): Pt will initiate transfer training PT Short Term Goal 2 (Week 1): Pt will initiate gait training PT Short Term Goal 3 (Week 1): Pt will participate in 3 hours of therapy per day  Skilled Therapeutic Interventions/Progress Updates:   Pt in supine and agreeable to therapy, no c/o or evidence of pain. Engaged pt w/ tossing ball for a few minutes to build rapport and then asked pt to transfer to EOB. Pt able to initiate and go 1/3 of the way w/o physical assist and max encouragement cues. Pt refused to go further, dependently transferred to EOB. Pt yelling "no!" but then stated "I love you" once she calmed down at EOB. Engaged in tossing ball and kicking ball at EOB to work on global strength and endurance. Then turned on music and pt allowed therapist to hold her hands and dance to the music at EOB. Encouraged pt to incorporate shoulder movements and trunk movements into dancing, but she refused. Spent 20+ minutes attempting to engage pt in reaching for armrests of chair in front of her in attempts to stand. Pt very internally distracted and required max cues to attend to task and to conversation. Pt yelling when therapist attempt to perform hand-over-hand assist. Did not attempt to dependently transfer pt 2/2 increased agitation w/ therapist attempting any dependent movement. Time of session ended and pt unsafe to remain EOB independently, dependent transfer to supine for safety. Ended session in supine, all needs in reach.   Therapy Documentation Precautions:  Precautions Precautions: Fall Precaution Comments: intellectual disability at baseline Restrictions Weight Bearing Restrictions: No  Therapy/Group: Individual  Therapy  Ketsia Linebaugh Clent Demark 04/25/2019, 2:49 PM

## 2019-04-25 NOTE — Progress Notes (Signed)
Occupational Therapy Session Note  Patient Details  Name: Rebecca Bruce MRN: [id number] Date of Birth: 1960/07/16  Today's Date: 04/25/2019 OT Individual Time: 1100-1209 OT Individual Time Calculation (min): 69 min    Short Term Goals: Week 1:  OT Short Term Goal 1 (Week 1): Pt will don shirt with mod A OT Short Term Goal 2 (Week 1): Pt will complete transfer to [facility] with mod A OT Short Term Goal 3 (Week 1): Pt will feed herself a meal with min-mod cueing and min A overall  Skilled Therapeutic Interventions/Progress Updates:    1;1. Pt reporting no pain, (however responding no to all things). Pt total A to assume EOB. Pt and RN present with OT coaxing pt to consume medication by teaspoon. Pt with improved intake with alternating bites with plain applesauce however refuses any more after 1/2 of medications. Pt throws and kicks beach ball back and forth with OT for command following and building rapport. Pt refuses to stand with recliner for support. When given minute by minute warnings, "in 5 min you need to lay down, otherwise I will have to help you move down." Pt able to initate sit>supine, however not strong enough to maneuver LEs therefore requred help. Exited session with pt seated in bed, exit alarm on and call lght in reach  Therapy Documentation Precautions:  Precautions Precautions: Fall Precaution Comments: intellectual disability at baseline Restrictions Weight Bearing Restrictions: No General:   Vital Signs:   Pain: Pain Assessment Pain Scale: 0-10 Pain Score: Asleep ADL:   Vision   Perception    Praxis   Exercises:   Other Treatments:     Therapy/Group: Individual Therapy  Tonny Branch 04/25/2019, 12:16 PM

## 2019-04-25 NOTE — Care Management (Signed)
Inpatient [facility] Individual Statement of Services  Patient Name:  Rebecca Bruce  Date:  04/25/2019  Welcome to the [facility].  Our goal is to provide you with an individualized program based on your diagnosis and situation, designed to meet your specific needs.  With this comprehensive rehabilitation program, you will be expected to participate in at least 3 hours of rehabilitation therapies Monday-Friday, with modified therapy programming on the weekends.  Your rehabilitation program will include the following services:  Physical Therapy (PT), Occupational Therapy (OT), Speech Therapy (ST), 24 hour per day rehabilitation nursing, Therapeutic Recreaction (TR), Case Management (Social Worker), Rehabilitation Medicine, Nutrition Services and Pharmacy Services  Weekly team conferences will be held on Tuesdays to discuss your progress.  Your Social Worker will talk with you frequently to get your input and to update you on team discussions.  Team conferences with you and your family in attendance may also be held.  Expected length of stay: 2-3 weeks   Overall anticipated outcome: supervision/ contact guard  Depending on your progress and recovery, your program may change. Your Social Worker will coordinate services and will keep you informed of any changes. Your Social Worker's name and contact numbers are listed  below.  The following services may also be recommended but are not provided by the [facility] will be made to provide these services after discharge if needed.  Arrangements include referral to agencies that provide these services.  Your insurance has been verified to be:  Medicare/ Medicaid Your primary doctor is:  Geophysical data processor  Pertinent information will be shared with your doctor and your insurance  company.  Social Worker:  [place], [place] or [phone]   Information discussed with and copy given to patient by: Lennart Pall, 04/25/2019, 6:30 AM

## 2019-04-26 ENCOUNTER — Inpatient Hospital Stay (HOSPITAL_COMMUNITY): Payer: Medicare Other | Admitting: Physical Therapy

## 2019-04-26 ENCOUNTER — Inpatient Hospital Stay (HOSPITAL_COMMUNITY): Payer: Medicare Other

## 2019-04-26 ENCOUNTER — Inpatient Hospital Stay (HOSPITAL_COMMUNITY): Payer: Medicare Other | Admitting: Speech Pathology

## 2019-04-26 LAB — GLUCOSE, CAPILLARY
Glucose-Capillary: 87 mg/dL (ref 70–99)
Glucose-Capillary: 75 mg/dL (ref 70–99)
Glucose-Capillary: 72 mg/dL (ref 70–99)
Glucose-Capillary: 91 mg/dL (ref 70–99)

## 2019-04-26 NOTE — Progress Notes (Addendum)
Physical Therapy Session Note  Patient Details  Name: Rebecca Bruce MRN: [id number] Date of Birth: Dec 01, 1960  Today's Date: 04/26/2019 PT Individual Time: 1400-1515 PT Individual Time Calculation (min): 75 min   Short Term Goals: Week 1:  PT Short Term Goal 1 (Week 1): Pt will initiate transfer training PT Short Term Goal 2 (Week 1): Pt will initiate gait training PT Short Term Goal 3 (Week 1): Pt will participate in 3 hours of therapy per day  Skilled Therapeutic Interventions/Progress Updates:   Pt received sitting EOB, take over from SLP. Pt in pleasant mood and asking to use bathroom. Therapist then spent the next 75 minutes encouraging pt to stand or to transfer to the toilet. Pt appeared motivated by toileting and would intermittently motion and scoot forward, she demonstrated strong scooting today - able to shift both forward/backwards and laterally at EOB. She would than lose focus or become distracted by sounds in hallway and say she didn't need to toilet. Max cues to attend to task, visual and verbal cues for multiple different techniques and hand placements, and offered multiple times to physically assist pt. Pt would yell out "no!" whenever therapist got near her and she very much preferred to stand herself though she was able to verbalize that she was too weak to stand. Attempted to build rapport w/ pt, in hopes she would trust therapist to physically assist her into standing. Even re-attempted using stedy, pt was able to independently place feet on foot plate of stedy but then refused to stand. Dependent transfer to supine for safety at end of session. Pt unsafe to sit EOB independently. Ended session in supine, all needs in reach.    Will continue to work towards building trust and rapport w/ pt while engaging in upright activity. Goal remains to assist her to standing in order to initiate transfer and gait training.   Therapy Documentation Precautions:   Precautions Precautions: Fall Precaution Comments: intellectual disability at baseline Restrictions Weight Bearing Restrictions: No  Therapy/Group: Individual Therapy  Kimberely Mccannon Clent Demark 04/26/2019, 3:36 PM

## 2019-04-26 NOTE — Progress Notes (Signed)
[facility] PHYSICAL MEDICINE & REHABILITATION PROGRESS NOTE   Subjective/Complaints: Pt sitting up in bed. Slow progress in therapy d/t cognition/behavior. Denies any problems this morning  ROS: Limited due to cognitive/behavioral      Objective:   No results found. Recent Labs    04/25/19 0626  WBC 14.2*  HGB 10.9*  HCT 34.8*  PLT 484*   Recent Labs    04/25/19 0626  NA 141  K 3.6  CL 103  CO2 28  GLUCOSE 106*  BUN 14  CREATININE 0.95  CALCIUM 8.9    Intake/Output Summary (Last 24 hours) at 04/26/2019 0949 Last data filed at 04/26/2019 0740 Gross per 24 hour  Intake 118 ml  Output --  Net 118 ml     Physical Exam: Vital Signs Blood pressure 129/75, pulse 74, temperature 98.2 F (36.8 C), temperature source Oral, resp. rate 19, weight 79.2 kg, SpO2 100 %. Constitutional: No distress . Vital signs reviewed. HEENT: EOMI, oral membranes moist Neck: supple Cardiovascular: RRR without murmur. No JVD    Respiratory: CTA Bilaterally without wheezes or rales. Normal effort    GI: BS +, non-tender, non-distended  Neurologic: Cranial nerves II through XII intact, motor strength is at leaset 4- to 4/5 in bilateral deltoid, bicep, tricep, grip, hip flexor, knee extensors, ankle dorsiflexor and plantar flexor Senses pain and light touch in all 4's. Very limited insight and awareness. Speech dysarthric.   Musculoskeletal: Full range of motion in all 4 extremities. No joint swelling Skin: very dry feet, MASD near sacrum/buttocks stable Psych: flat, cooperative   Assessment/Plan: 1. Functional deficits secondary to covid encephalopathy which require 3+ hours per day of interdisciplinary therapy in a comprehensive inpatient rehab setting.  Physiatrist is providing close team supervision and 24 hour management of active medical problems listed below.  Physiatrist and rehab team continue to assess barriers to discharge/monitor patient progress toward functional and medical  goals  Care Tool:  Bathing    Body parts bathed by patient: Chest, Abdomen, Face   Body parts bathed by helper: Right arm, Left arm, Left upper leg, Right upper leg, Right lower leg, Left lower leg, Front perineal area, Buttocks     Bathing assist Assist Level: Maximal Assistance - Patient 24 - 49%     Upper Body Dressing/Undressing Upper body dressing   What is the patient wearing?: Hospital gown only    Upper body assist Assist Level: Maximal Assistance - Patient 25 - 49%    Lower Body Dressing/Undressing Lower body dressing    Lower body dressing activity did not occur: Refused What is the patient wearing?: Incontinence brief     Lower body assist Assist for lower body dressing: Maximal Assistance - Patient 25 - 49%     Toileting Toileting Toileting Activity did not occur Landscape architect and hygiene only): Refused  Toileting assist Assist for toileting: Dependent - Patient 0%     Transfers Chair/bed transfer  Transfers assist  Chair/bed transfer activity did not occur: Refused  Chair/bed transfer assist level: Moderate Assistance - Patient 50 - 74%     Locomotion Ambulation   Ambulation assist   Ambulation activity did not occur: Refused          Walk 10 feet activity   Assist  Walk 10 feet activity did not occur: Refused        Walk 50 feet activity   Assist Walk 50 feet with 2 turns activity did not occur: Refused  Walk 150 feet activity   Assist Walk 150 feet activity did not occur: Refused         Walk 10 feet on uneven surface  activity   Assist Walk 10 feet on uneven surfaces activity did not occur: Armed forces operational officer activity did not occur: Refused         Wheelchair 50 feet with 2 turns activity    Assist    Wheelchair 50 feet with 2 turns activity did not occur: Refused       Wheelchair 150 feet activity     Assist  Wheelchair 150 feet  activity did not occur: Refused       Blood pressure 129/75, pulse 74, temperature 98.2 F (36.8 C), temperature source Oral, resp. rate 19, weight 79.2 kg, SpO2 100 %.  Medical Problem List and Plan: 1. Debility and encephalopathy secondary to Covid 19 -patient may shower -ELOS/Goals:3-4wks, Min A Mobility and ADL goals  -caregiver from [facility] to help facilitate participation in therapy sessions although carryover and participation have remained challenges 2. Antithrombotics: -DVT/anticoagulation:Mechanical:Sequential compression devices, below kneeBilateral lower extremities.   -continue lovenox 40mg  daily -antiplatelet therapy: N/A 3. Pain Management:Was on Gabapentin 300 mg/hs PTA. Tylenol prn for now. 4. Mood:  consistent schedule/familiar faces to help with security/compliance  -suspect cognition is not too far off baseline -antipsychotic agents: N/A 5. Neuropsych: This patientis notcapable of making decisions on herown behalf.  6. Skin/Wound Care:Routine pressure relief measure.  -added eucerin cream for dry feet 7. Fluids/Electrolytes/Nutrition:encourage PO, monitor for oral pocketing.        1/11 -replete K+ 3.1 1/11  1/14: potassium 3.6, continue supplementation   1/15: re-check potassium Monday    8. Multifactorial encephalopathy post Covid: Catatonia has resolved.   -observe for episodic agitation although this seems better  -cognition likely close to baseline 9. ABLA: Has been transfused with 4 units PRBC. H/H stable. Continue to monitor with serial checks.  10. Malnutrition: Intake limited to certain foods--offer supplements thorough out the day.   1/11 -added megace as appetite stimulant  1/15- intake remains inconsistent to poor. May need to upgrade to D3 11. Multilevel Cervical stenosis: Follow up with Dr. Annette Stable after discharge.  12. H/o major depressive disorder: home paxil resumed   13.  Prediabetes: BS reasonable--glucose normal this week 14. Elevated LFT's: trending down 11/1 15. Persistent leukocytosis: 11/1 16.1  -1/14--->14.2   -no active signs of infection   1/15 recheck Monday  16. Dysphagia: D2 thin diet  -tolerating thus far although intake isn't good  -advance per SLP, advancing consistency may help with intake   LOS: 5 days A FACE TO FACE EVALUATION WAS PERFORMED  [name] 04/26/2019, 9:49 AM

## 2019-04-26 NOTE — Progress Notes (Signed)
Speech Language Pathology Daily Session Note  Patient Details  Name: Rebecca Bruce MRN: [id number] Date of Birth: 1960-06-21  Today's Date: 04/26/2019 SLP Individual Time: 1300-1355 SLP Individual Time Calculation (min): 55 min  Short Term Goals: Week 1: SLP Short Term Goal 1 (Week 1): Pt will demonstrate sustained attention in 1 minute interval with max A verbal cues for redirection. SLP Short Term Goal 2 (Week 1): Pt will follow basic 1 step commands in functional tasks with 70% accuracy given max A verbal cues. SLP Short Term Goal 3 (Week 1): Pt will demonstrate orientation to place and time with max A verbal/visual cues. SLP Short Term Goal 4 (Week 1): Pt will express wants/needs at phrase level with mod A verbal cues for clarifcation of message. SLP Short Term Goal 5 (Week 1): Pt will consume dys 3 and thin liquid diet with minimal overt s/s aspiration and supervision A verbal cues for effective mastictaion and oral clearance.  Skilled Therapeutic Interventions: Skilled treatment session focused on cognitive goals. SLP facilitated session by providing extra time and overall supervision level verbal cues for patient to sit EOB independently. Attempted to transfer to the recliner with overall Max A multimodal cues and extended time without success. Patient reported her stomach hurt and that she needed to use the commode but would not transfer despite Max encouragement, cues and extended time without success. RN administered a shot in patient's stomach which patient was perseverative on throughout the remainder of the session. Patient handed off to PT. Continue with current plan of care.      Pain No/Denies Pain   Therapy/Group: Individual Therapy  Rebecca Bruce 04/26/2019, 3:34 PM

## 2019-04-26 NOTE — Progress Notes (Signed)
Occupational Therapy Session Note  Patient Details  Name: Manjit Stidd MRN: [id number] Date of Birth: 11-29-1960  Today's Date: 04/26/2019 OT Individual Time: [id number] OT Individual Time Calculation (min): 75 min    Short Term Goals: Week 1:  OT Short Term Goal 1 (Week 1): Pt will don shirt with mod A OT Short Term Goal 2 (Week 1): Pt will complete transfer to [facility] with mod A OT Short Term Goal 3 (Week 1): Pt will feed herself a meal with min-mod cueing and min A overall  Skilled Therapeutic Interventions/Progress Updates:    1;1. Pt received in bed agreeable to tx. Pt able to come to EOB with A only to scoot chuck pad around. Pt able to sort blocks by color seated EOB with min question cues. Attempted various methods to have pt sit to stand at EOB, when pt reporting wanting to sit in "the baby chair." (standard chair with arm rests). Pt begins initiating sit to stand however screams, "stop touching me." When Ot attempted to assist power up. Pt reports need to toilet and pt attempts transfer to [facility], however refuses A. Pt required to lay down and rolls B with MOD A for +2 to cleanse peri area and buttocks. Exited session with pt seated in bed, exit alram on and call light in reach  Therapy Documentation Precautions:  Precautions Precautions: Fall Precaution Comments: intellectual disability at baseline Restrictions Weight Bearing Restrictions: No General:   Vital Signs:   Pain: Pain Assessment Pain Scale: Faces Faces Pain Scale: No hurt ADL:   Vision   Perception    Praxis   Exercises:   Other Treatments:     Therapy/Group: Individual Therapy  Tonny Branch 04/26/2019, 12:20 PM

## 2019-04-27 ENCOUNTER — Inpatient Hospital Stay (HOSPITAL_COMMUNITY): Payer: Medicare Other | Admitting: Speech Pathology

## 2019-04-27 DIAGNOSIS — D72829 Elevated white blood cell count, unspecified: Secondary | ICD-10-CM

## 2019-04-27 DIAGNOSIS — R7303 Prediabetes: Secondary | ICD-10-CM

## 2019-04-27 DIAGNOSIS — R1310 Dysphagia, unspecified: Secondary | ICD-10-CM

## 2019-04-27 DIAGNOSIS — E876 Hypokalemia: Secondary | ICD-10-CM

## 2019-04-27 DIAGNOSIS — R7401 Elevation of levels of liver transaminase levels: Secondary | ICD-10-CM

## 2019-04-27 LAB — GLUCOSE, CAPILLARY
Glucose-Capillary: 109 mg/dL — ABNORMAL HIGH (ref 70–99)
Glucose-Capillary: 90 mg/dL (ref 70–99)
Glucose-Capillary: 101 mg/dL — ABNORMAL HIGH (ref 70–99)
Glucose-Capillary: 66 mg/dL — ABNORMAL LOW (ref 70–99)
Glucose-Capillary: 77 mg/dL (ref 70–99)

## 2019-04-27 NOTE — Progress Notes (Signed)
Pt refusing to take medications. Pt screaming "no,no, no". This nurse tried to administer meds mixed with pudding and apple sauce, and was unsuccessful with both.  Pts cbg is 77. Pt educated on other routes of medication to raise sugar if she did not eat anything. Pt still refused.

## 2019-04-27 NOTE — Progress Notes (Signed)
Speech Language Pathology Daily Session Note  Patient Details  Name: Rebecca Bruce MRN: [id number] Date of Birth: 1960-12-11  Today's Date: 04/27/2019 SLP Individual Time: 1137-1207 SLP Individual Time Calculation (min): 30 min  Short Term Goals: Week 1: SLP Short Term Goal 1 (Week 1): Pt will demonstrate sustained attention in 1 minute interval with max A verbal cues for redirection. SLP Short Term Goal 2 (Week 1): Pt will follow basic 1 step commands in functional tasks with 70% accuracy given max A verbal cues. SLP Short Term Goal 3 (Week 1): Pt will demonstrate orientation to place and time with max A verbal/visual cues. SLP Short Term Goal 4 (Week 1): Pt will express wants/needs at phrase level with mod A verbal cues for clarifcation of message. SLP Short Term Goal 5 (Week 1): Pt will consume dys 3 and thin liquid diet with minimal overt s/s aspiration and supervision A verbal cues for effective mastictaion and oral clearance.  Skilled Therapeutic Interventions:  Pt was seen for skilled ST targeting dysphagia goals.  Pt was in bed upon therapist's arrival and was asking therapist to lower her bedrail.  Pt was agreeable to consuming a functional snack of pureed/magic cup textures and thin liquids but declined additional solids.  Pt fed herself an entire magic cup and drank a full 8 oz cup of water with no overt s/s of aspiration and supervision use of universal swallowing precautions.  Pt needed intermittent redirection to the task at hand due to being internally distracted/perseverative on certain needs but responded well to lots of praise.  Pt was left in bed with bed alarm set and call bell within reach.  Continue per current plan of care.    Pain Pain Assessment Pain Scale: 0-10 Pain Score: 0-No pain Faces Pain Scale: No hurt  Therapy/Group: Individual Therapy  Jorene Kaylor, Selinda Orion 04/27/2019, 12:26 PM

## 2019-04-27 NOTE — Progress Notes (Signed)
[place] PHYSICAL MEDICINE & REHABILITATION PROGRESS NOTE   Subjective/Complaints: Patient seen sitting up in bed this morning.  No reported issues overnight.  ROS: Limited due to cognition.  Objective:   No results found. Recent Labs    04/25/19 0626  WBC 14.2*  HGB 10.9*  HCT 34.8*  PLT 484*   Recent Labs    04/25/19 0626  NA 141  K 3.6  CL 103  CO2 28  GLUCOSE 106*  BUN 14  CREATININE 0.95  CALCIUM 8.9    Intake/Output Summary (Last 24 hours) at 04/27/2019 0854 Last data filed at 04/26/2019 1748 Gross per 24 hour  Intake 354 ml  Output --  Net 354 ml     Physical Exam: Vital Signs Blood pressure 116/78, pulse 86, temperature 97.7 F (36.5 C), temperature source Oral, resp. rate 16, weight 81 kg, SpO2 99 %. Constitutional: No distress . Vital signs reviewed. HENT: Normocephalic.  Atraumatic. Eyes: EOMI. No discharge. Cardiovascular: No JVD. Respiratory: Normal effort.  No stridor. GI: Non-distended. Skin: Warm and dry.  Intact. Psych: Appears apprehensive and skeptical Musc: No edema in extremities.  No tenderness in extremities. Neurologic: Alert Dysarthria Refusing to participate in MMT this morning   Assessment/Plan: 1. Functional deficits secondary to covid encephalopathy which require 3+ hours per day of interdisciplinary therapy in a comprehensive inpatient rehab setting.  Physiatrist is providing close team supervision and 24 hour management of active medical problems listed below.  Physiatrist and rehab team continue to assess barriers to discharge/monitor patient progress toward functional and medical goals  Care Tool:  Bathing    Body parts bathed by patient: Chest, Abdomen, Face   Body parts bathed by helper: Right arm, Left arm, Left upper leg, Right upper leg, Right lower leg, Left lower leg, Front perineal area, Buttocks     Bathing assist Assist Level: Maximal Assistance - Patient 24 - 49%     Upper Body  Dressing/Undressing Upper body dressing   What is the patient wearing?: Hospital gown only    Upper body assist Assist Level: Maximal Assistance - Patient 25 - 49%    Lower Body Dressing/Undressing Lower body dressing    Lower body dressing activity did not occur: Refused What is the patient wearing?: Incontinence brief     Lower body assist Assist for lower body dressing: Maximal Assistance - Patient 25 - 49%     Toileting Toileting Toileting Activity did not occur Landscape architect and hygiene only): Refused  Toileting assist Assist for toileting: Dependent - Patient 0%     Transfers Chair/bed transfer  Transfers assist  Chair/bed transfer activity did not occur: Refused  Chair/bed transfer assist level: Moderate Assistance - Patient 50 - 74%     Locomotion Ambulation   Ambulation assist   Ambulation activity did not occur: Refused          Walk 10 feet activity   Assist  Walk 10 feet activity did not occur: Refused        Walk 50 feet activity   Assist Walk 50 feet with 2 turns activity did not occur: Refused         Walk 150 feet activity   Assist Walk 150 feet activity did not occur: Refused         Walk 10 feet on uneven surface  activity   Assist Walk 10 feet on uneven surfaces activity did not occur: Sales promotion account executive  Wheelchair activity did not occur: Refused         Wheelchair 50 feet with 2 turns activity    Assist    Wheelchair 50 feet with 2 turns activity did not occur: Refused       Wheelchair 150 feet activity     Assist  Wheelchair 150 feet activity did not occur: Refused       Blood pressure 116/78, pulse 86, temperature 97.7 F (36.5 C), temperature source Oral, resp. rate 16, weight 81 kg, SpO2 99 %.  Medical Problem List and Plan: 1. Debility and encephalopathy secondary to Covid 19  Continue CIR 2.  Antithrombotics: -DVT/anticoagulation:Mechanical:Sequential compression devices, below kneeBilateral lower extremities.   -continue lovenox 40mg  daily -antiplatelet therapy: N/A 3. Pain Management:Was on Gabapentin 300 mg/hs PTA. Tylenol prn for now. 4. Mood:  consistent schedule/familiar faces to help with security/compliance  -suspect cognition is not too far off baseline -antipsychotic agents: N/A 5. Neuropsych: This patientis not capable of making decisions on herown behalf.  6. Skin/Wound Care:Routine pressure relief measure.  -added eucerin cream for dry feet 7. Fluids/Electrolytes/Nutrition:encourage PO, monitor for oral pocketing.   Hypokalemia-potassium 3.6 on 1/14, continue supplementation  Labs ordered for Monday 8. Multifactorial encephalopathy post Covid: Catatonia has resolved.   -observe for episodic agitation although this seems better  -cognition likely close to baseline 9. ABLA: Has been transfused with 4 units PRBC.  Continue to monitor with serial checks.   Hemoglobin 10.9 on 1/14  Continue to monitor 10. Malnutrition: Intake limited to certain foods--offer supplements thorough out the day.   1/11 -added megace as appetite stimulant  Variable intake on 1/16 11. Multilevel Cervical stenosis: Follow up with Dr. Annette Stable after discharge.  12. H/o major depressive disorder: home paxil resumed   13. Prediabetes:   Relatively controlled on 1/16 14. Elevated LFT's: trending down on 1/11  Labs ordered for Monday 15. Persistent leukocytosis: 11/1 16.1  WBCs 14.2 on 1/14, labs ordered for Monday  Afebrile 16. Dysphagia: D2 thin diet  -advance per SLP, advancing consistency may help with intake   LOS: 6 days A FACE TO FACE EVALUATION WAS PERFORMED  Madelynne Lasker Lorie Phenix 04/27/2019, 8:54 AM

## 2019-04-28 ENCOUNTER — Inpatient Hospital Stay (HOSPITAL_COMMUNITY): Payer: Medicare Other | Admitting: Occupational Therapy

## 2019-04-28 DIAGNOSIS — R030 Elevated blood-pressure reading, without diagnosis of hypertension: Secondary | ICD-10-CM

## 2019-04-28 LAB — GLUCOSE, CAPILLARY
Glucose-Capillary: 108 mg/dL — ABNORMAL HIGH (ref 70–99)
Glucose-Capillary: 81 mg/dL (ref 70–99)
Glucose-Capillary: 83 mg/dL (ref 70–99)
Glucose-Capillary: 84 mg/dL (ref 70–99)

## 2019-04-28 MED ORDER — CLONAZEPAM 0.5 MG PO TABS
0.2500 mg | ORAL_TABLET | Freq: Once | ORAL | Status: AC
  Administered 2019-04-28: 19:00:00 0.25 mg via ORAL
  Filled 2019-04-28: qty 1

## 2019-04-28 NOTE — Progress Notes (Signed)
Pt continued to scream and yell. Pt would yell louder when staff approached the room. This nurse was able to get the pt to take hs medications crushed in [facility] ice cream. K+ was mixed with apple sauce and pt refused to eat it.  Pt only took one sip of the megace. Pt calm sitting up in bed at this time watching tv

## 2019-04-28 NOTE — Progress Notes (Signed)
Occupational Therapy Session Note  Patient Details  Name: Rebecca Bruce MRN: [id number] Date of Birth: February 26, 1961  Today's Date: 04/28/2019 OT Individual Time: 1402-1450 OT Individual Time Calculation (min): 48 min  12 minutes missed   Short Term Goals: Week 1:  OT Short Term Goal 1 (Week 1): Pt will don shirt with mod A OT Short Term Goal 2 (Week 1): Pt will complete transfer to [facility] with mod A OT Short Term Goal 3 (Week 1): Pt will feed herself a meal with min-mod cueing and min A overall  Skilled Therapeutic Interventions/Progress Updates:    Pt greeted in bed with no s/s pain. Initially requesting to be boosted up in bed. Pt able to place UEs on bedrails to assist but did not initiate pulling herself up. Discussed with pt that if she didn't assist with scooting then we'd have to get a 2nd person to assist. Pt then requested to transfer to the recliner. Supine<sit completed with supervision assist. Once EOB, pt scooted several times towards recliner but needed max vcs for sustained attention to task. Noted posterior lean and OT tried to facilitate anterior weight shift while scooting. Pt tolerated anterior weight shifting with therapist but unable to tolerate facilitation of movement at pelvis for lateral scoot or squat pivot due to c/o pain in unspecified location. Note pt to be externally distracted by TV or alarms in hallway. Despite max encouragement and significantly increased time, she declined to transfer to recliner. +2 for transition to supine and boosting up in bed. Pt drank her soda with supervision. Left pt with all needs within reach, 4 bedrails up, and bed alarm set. Time missed due to pt refusal to participate further.    Therapy Documentation Precautions:  Precautions Precautions: Fall Precaution Comments: intellectual disability at baseline Restrictions Weight Bearing Restrictions: No General: General OT Amount of Missed Time: 12 Minutes Vital Signs: Therapy  Vitals Temp: 98 F (36.7 C) Pulse Rate: (!) 108 Resp: 19 BP: 128/88 Patient Position (if appropriate): Sitting Oxygen Therapy SpO2: 100 % O2 Device: Room Air ADL:       Therapy/Group: Individual Therapy  Adysen Raphael A Ruairi Stutsman 04/28/2019, 5:16 PM

## 2019-04-28 NOTE — Progress Notes (Signed)
[place] PHYSICAL MEDICINE & REHABILITATION PROGRESS NOTE   Subjective/Complaints: Patient seen sitting up in bed this morning.  She indicates she slept well overnight.  She even asks "how are you" this AM.  ROS: Limited due to cognition, but states that she denies CP, shortness of breath, nausea, vomiting, diarrhea..  Objective:   No results found. No results for input(s): WBC, HGB, HCT, PLT in the last 72 hours. No results for input(s): NA, K, CL, CO2, GLUCOSE, BUN, CREATININE, CALCIUM in the last 72 hours.  Intake/Output Summary (Last 24 hours) at 04/28/2019 0804 Last data filed at 04/28/2019 0542 Gross per 24 hour  Intake 560 ml  Output --  Net 560 ml     Physical Exam: Vital Signs Blood pressure (!) 117/102, pulse (!) 101, temperature 98.3 F (36.8 C), temperature source Oral, resp. rate 18, weight 81 kg, SpO2 100 %. Constitutional: No distress . Vital signs reviewed. HENT: Normocephalic.  Atraumatic. Eyes: EOMI. No discharge. Cardiovascular: No JVD. Respiratory: Normal effort.  No stridor. GI: Non-distended. Skin: Warm and dry.  Intact. Psych: Normal mood.  Normal behavior. Musc: No edema in extremities.  No tenderness in extremities. Neurologic: Alert Dysarthria Motor: Bilateral upper extremities: Grossly 4/5 proximal distal Bilateral lower extremities, patient states that she cannot move them, however seen moving them spontaneously.  Assessment/Plan: 1. Functional deficits secondary to covid encephalopathy which require 3+ hours per day of interdisciplinary therapy in a comprehensive inpatient rehab setting.  Physiatrist is providing close team supervision and 24 hour management of active medical problems listed below.  Physiatrist and rehab team continue to assess barriers to discharge/monitor patient progress toward functional and medical goals  Care Tool:  Bathing    Body parts bathed by patient: Chest, Abdomen, Face   Body parts bathed by helper: Right  arm, Left arm, Left upper leg, Right upper leg, Right lower leg, Left lower leg, Front perineal area, Buttocks     Bathing assist Assist Level: Maximal Assistance - Patient 24 - 49%     Upper Body Dressing/Undressing Upper body dressing   What is the patient wearing?: Hospital gown only    Upper body assist Assist Level: Maximal Assistance - Patient 25 - 49%    Lower Body Dressing/Undressing Lower body dressing    Lower body dressing activity did not occur: Refused What is the patient wearing?: Incontinence brief     Lower body assist Assist for lower body dressing: Maximal Assistance - Patient 25 - 49%     Toileting Toileting Toileting Activity did not occur Landscape architect and hygiene only): Refused  Toileting assist Assist for toileting: Dependent - Patient 0%     Transfers Chair/bed transfer  Transfers assist  Chair/bed transfer activity did not occur: Refused  Chair/bed transfer assist level: Moderate Assistance - Patient 50 - 74%     Locomotion Ambulation   Ambulation assist   Ambulation activity did not occur: Refused          Walk 10 feet activity   Assist  Walk 10 feet activity did not occur: Refused        Walk 50 feet activity   Assist Walk 50 feet with 2 turns activity did not occur: Refused         Walk 150 feet activity   Assist Walk 150 feet activity did not occur: Refused         Walk 10 feet on uneven surface  activity   Assist Walk 10 feet on uneven surfaces activity did  not occur: Armed forces operational officer activity did not occur: Refused         Wheelchair 50 feet with 2 turns activity    Assist    Wheelchair 50 feet with 2 turns activity did not occur: Refused       Wheelchair 150 feet activity     Assist  Wheelchair 150 feet activity did not occur: Refused       Blood pressure (!) 117/102, pulse (!) 101, temperature 98.3 F (36.8 C),  temperature source Oral, resp. rate 18, weight 81 kg, SpO2 100 %.  Medical Problem List and Plan: 1. Debility and encephalopathy secondary to Covid 19  Continue CIR 2. Antithrombotics: -DVT/anticoagulation:Mechanical:Sequential compression devices, below kneeBilateral lower extremities.   -continue lovenox 40mg  daily -antiplatelet therapy: N/A 3. Pain Management:Was on Gabapentin 300 mg/hs PTA. Tylenol prn for now. 4. Mood:  consistent schedule/familiar faces to help with security/compliance  -suspect cognition is not too far off baseline -antipsychotic agents: N/A 5. Neuropsych: This patientis not capable of making decisions on herown behalf.  6. Skin/Wound Care:Routine pressure relief measure.  -added eucerin cream for dry feet 7. Fluids/Electrolytes/Nutrition:encourage PO, monitor for oral pocketing.   Hypokalemia-potassium 3.6 on 1/14, continue supplementation  Labs ordered for tomorrow 8. Multifactorial encephalopathy post Covid: Catatonia has resolved.   -observe for episodic agitation although this seems better  -cognition likely close to baseline 9. ABLA: Has been transfused with 4 units PRBC.  Continue to monitor with serial checks.   Hemoglobin 10.9 on 1/14, labs ordered for tomorrow  Continue to monitor 10. Malnutrition: Intake limited to certain foods--offer supplements thorough out the day.   1/11 -added megace as appetite stimulant  P.o. intake remains inconsistent on 1/17 11. Multilevel Cervical stenosis: Follow up with Dr. Annette Stable after discharge.  12. H/o major depressive disorder: home paxil resumed   13. Prediabetes:   Relatively controlled on 1/17 14. Elevated LFT's: trending down on 1/11  Labs ordered for tomorrow 15. Persistent leukocytosis: 11/1 16.1  WBCs 14.2 on 1/14, labs ordered for tomorrow  Afebrile 16. Dysphagia: D2 thin diet  -advance per SLP, advancing consistency may help with intake 17.  Elevated diastolic  blood pressures  ?  Trending up on 1/17, continue monitor for trend and consider medications if persistently elevated  LOS: 7 days A FACE TO FACE EVALUATION WAS PERFORMED  Hersey Maclellan Lorie Phenix 04/28/2019, 8:04 AM

## 2019-04-28 NOTE — Progress Notes (Signed)
Patient noted to be yelling out most of the day. Yelling out "pat" and other names familiar to her. Redirection, reorientation, and one to one interaction unsuccessful in helping to improve behaviors. Pt resistive to medications, however compliant with encouragement. Pt noted to yell with limited physical assessment and personal care. Denies pain or discomfort. When questioned about possible needs or requests patient states she doesn't want anything. Dr. Posey Pronto notified of patient's behaviors. Recommended patient accompany staff at nurses station and if unsuccessful, given Klonopin 0.25 mg po x1. Pt refused assistance to chair to be escorted to nurses station. However was compliant with medication. Reported to oncoming shift to continue to monitor.

## 2019-04-28 NOTE — Progress Notes (Signed)
Pt sitting up in the middle of the bed with legs hanging off. Pt refusing to lay down, out legs back in the bed or to adjust the bed to make her comfortable. This nurse attempted to fix the bed for the pt and the pt started screaming. Pt has call light in reach and bed alarm on middle setting.

## 2019-04-29 ENCOUNTER — Inpatient Hospital Stay (HOSPITAL_COMMUNITY): Payer: Medicare Other

## 2019-04-29 ENCOUNTER — Inpatient Hospital Stay (HOSPITAL_COMMUNITY): Payer: Medicare Other | Admitting: Physical Therapy

## 2019-04-29 ENCOUNTER — Inpatient Hospital Stay (HOSPITAL_COMMUNITY): Payer: Medicare Other | Admitting: Speech Pathology

## 2019-04-29 LAB — COMPREHENSIVE METABOLIC PANEL
ALT: 50 U/L — ABNORMAL HIGH (ref 0–44)
AST: 28 U/L (ref 15–41)
Albumin: 2.8 g/dL — ABNORMAL LOW (ref 3.5–5.0)
Alkaline Phosphatase: 92 U/L (ref 38–126)
Anion gap: 9 (ref 5–15)
BUN: 11 mg/dL (ref 6–20)
CO2: 27 mmol/L (ref 22–32)
Calcium: 9.2 mg/dL (ref 8.9–10.3)
Chloride: 105 mmol/L (ref 98–111)
Creatinine, Ser: 0.94 mg/dL (ref 0.44–1.00)
GFR calc Af Amer: >60 mL/min (ref >60)
GFR calc non Af Amer: >60 mL/min (ref >60)
Glucose, Bld: 93 mg/dL (ref 70–99)
Potassium: 3.9 mmol/L (ref 3.5–5.1)
Sodium: 141 mmol/L (ref 135–145)
Total Bilirubin: 0.8 mg/dL (ref 0.3–1.2)
Total Protein: 7.2 g/dL (ref 6.5–8.1)

## 2019-04-29 LAB — GLUCOSE, CAPILLARY
Glucose-Capillary: 116 mg/dL — ABNORMAL HIGH (ref 70–99)
Glucose-Capillary: 67 mg/dL — ABNORMAL LOW (ref 70–99)
Glucose-Capillary: 112 mg/dL — ABNORMAL HIGH (ref 70–99)
Glucose-Capillary: 68 mg/dL — ABNORMAL LOW (ref 70–99)
Glucose-Capillary: 133 mg/dL — ABNORMAL HIGH (ref 70–99)
Glucose-Capillary: 93 mg/dL (ref 70–99)

## 2019-04-29 NOTE — Progress Notes (Signed)
[place] PHYSICAL MEDICINE & REHABILITATION PROGRESS NOTE   Subjective/Complaints: No new issues. Intake still inconsistent to poor. No problems reported overnight  ROS: Limited due to cognitive/behavioral    Objective:   No results found. No results for input(s): WBC, HGB, HCT, PLT in the last 72 hours. No results for input(s): NA, K, CL, CO2, GLUCOSE, BUN, CREATININE, CALCIUM in the last 72 hours.  Intake/Output Summary (Last 24 hours) at 04/29/2019 1017 Last data filed at 04/28/2019 2200 Gross per 24 hour  Intake 480 ml  Output --  Net 480 ml     Physical Exam: Vital Signs Blood pressure 134/80, pulse 79, temperature (!) 97.5 F (36.4 C), resp. rate 19, weight 80.9 kg, SpO2 100 %. Constitutional: No distress . Vital signs reviewed. HEENT: EOMI, oral membranes moist Neck: supple Cardiovascular: RRR without murmur. No JVD    Respiratory: CTA Bilaterally without wheezes or rales. Normal effort    GI: BS +, non-tender, non-distended  Skin: Warm and dry.  Intact. Psych: flat but engages. Musc: No edema in extremities.  No tenderness in extremities. Neurologic: Alert, says hello. Follows basic commands, inconsistent. Limited insight Dysarthria Motor: Bilateral upper extremities: Grossly 4/5 proximal distal   Assessment/Plan: 1. Functional deficits secondary to covid encephalopathy which require 3+ hours per day of interdisciplinary therapy in a comprehensive inpatient rehab setting.  Physiatrist is providing close team supervision and 24 hour management of active medical problems listed below.  Physiatrist and rehab team continue to assess barriers to discharge/monitor patient progress toward functional and medical goals  Care Tool:  Bathing    Body parts bathed by patient: Chest, Abdomen, Face   Body parts bathed by helper: Right arm, Left arm, Left upper leg, Right upper leg, Right lower leg, Left lower leg, Front perineal area, Buttocks     Bathing assist  Assist Level: Maximal Assistance - Patient 24 - 49%     Upper Body Dressing/Undressing Upper body dressing   What is the patient wearing?: Hospital gown only    Upper body assist Assist Level: Maximal Assistance - Patient 25 - 49%    Lower Body Dressing/Undressing Lower body dressing    Lower body dressing activity did not occur: Refused What is the patient wearing?: Incontinence brief     Lower body assist Assist for lower body dressing: Maximal Assistance - Patient 25 - 49%     Toileting Toileting Toileting Activity did not occur Landscape architect and hygiene only): Refused  Toileting assist Assist for toileting: Dependent - Patient 0%     Transfers Chair/bed transfer  Transfers assist  Chair/bed transfer activity did not occur: Refused  Chair/bed transfer assist level: Moderate Assistance - Patient 50 - 74%     Locomotion Ambulation   Ambulation assist   Ambulation activity did not occur: Refused          Walk 10 feet activity   Assist  Walk 10 feet activity did not occur: Refused        Walk 50 feet activity   Assist Walk 50 feet with 2 turns activity did not occur: Refused         Walk 150 feet activity   Assist Walk 150 feet activity did not occur: Refused         Walk 10 feet on uneven surface  activity   Assist Walk 10 feet on uneven surfaces activity did not occur: Sales promotion account executive  Wheelchair activity did not occur: Refused         Wheelchair 50 feet with 2 turns activity    Assist    Wheelchair 50 feet with 2 turns activity did not occur: Refused       Wheelchair 150 feet activity     Assist  Wheelchair 150 feet activity did not occur: Refused       Blood pressure 134/80, pulse 79, temperature (!) 97.5 F (36.4 C), resp. rate 19, weight 80.9 kg, SpO2 100 %.  Medical Problem List and Plan: 1. Debility and encephalopathy secondary to Covid 19  Continue  CIR  -pt with inconsistent participation/po intake likely heavily influenced by baseline cognitive/behavioral deficits. May be better suited at group home with assistance of caregivers.  2. Antithrombotics: -DVT/anticoagulation:Mechanical:Sequential compression devices, below kneeBilateral lower extremities.   -continue lovenox 40mg  daily -antiplatelet therapy: N/A 3. Pain Management:Was on Gabapentin 300 mg/hs PTA. Tylenol prn for now. 4. Mood:  consistent schedule/familiar faces to help with security/compliance  -suspect cognition is at baseline -antipsychotic agents: N/A 5. Neuropsych: This patientis not capable of making decisions on herown behalf.  6. Skin/Wound Care:Routine pressure relief measure.  -added eucerin cream for dry feet 7. Fluids/Electrolytes/Nutrition:encourage PO, monitor for oral pocketing.   Hypokalemia-potassium 3.9  -po intake poor, albumin only 2.8 8. Multifactorial encephalopathy post Covid: Catatonia has resolved.   - agitation better  -cognition likely close to baseline 9. ABLA: Has been transfused with 4 units PRBC.  Continue to monitor with serial checks.   Hemoglobin 10.9 on 1/14, labs not done today?    10. Malnutrition: Intake limited to certain foods--offer supplements thorough out the day.   1/11 -added megace as appetite stimulant  P.o. intake remains poor on 1/18 11. Multilevel Cervical stenosis: Follow up with Dr. Annette Stable after discharge.  12. H/o major depressive disorder: home paxil resumed   13. Prediabetes:   Relatively controlled on 1/18 14. Elevated LFT's: trending down 1/18 15. Persistent leukocytosis: 11/1 16.1  WBCs 14.2 on 1/14, labs pending  Afebrile 16. Dysphagia: D2 thin diet  -advance per SLP, advancing consistency may help with intake   LOS: 8 days A FACE TO FACE EVALUATION WAS PERFORMED  [name] 04/29/2019, 10:17 AM

## 2019-04-29 NOTE — Progress Notes (Signed)
Speech Language Pathology Weekly Progress and Session Note  Patient Details  Name: Rebecca Bruce MRN: [id number] Date of Birth: 05/19/1960  Beginning of progress report period: April 21, 2019 End of progress report period: April 28, 2018  Today's Date: 04/29/2019 SLP Individual Time: 1210-1305 SLP Individual Time Calculation (min): 55 min  Short Term Goals: Week 1: SLP Short Term Goal 1 (Week 1): Pt will demonstrate sustained attention in 1 minute interval with max A verbal cues for redirection. SLP Short Term Goal 1 - Progress (Week 1): Met SLP Short Term Goal 2 (Week 1): Pt will follow basic 1 step commands in functional tasks with 70% accuracy given max A verbal cues. SLP Short Term Goal 2 - Progress (Week 1): Not met SLP Short Term Goal 3 (Week 1): Pt will demonstrate orientation to place and time with max A verbal/visual cues. SLP Short Term Goal 3 - Progress (Week 1): Not met SLP Short Term Goal 4 (Week 1): Pt will express wants/needs at phrase level with mod A verbal cues for clarifcation of message. SLP Short Term Goal 4 - Progress (Week 1): Met SLP Short Term Goal 5 (Week 1): Pt will consume dys 3 and thin liquid diet with minimal overt s/s aspiration and supervision A verbal cues for effective mastictaion and oral clearance. SLP Short Term Goal 5 - Progress (Week 1): Not met    New Short Term Goals: Week 2: SLP Short Term Goal 1 (Week 2): Pt will demonstrate sustained attention for 2 minute intervals with max A verbal cues for redirection. SLP Short Term Goal 2 (Week 2): Pt will follow basic 1 step commands in functional tasks with 70% accuracy given max A verbal cues. SLP Short Term Goal 3 (Week 2): Patient will consume current diet with minimal overt s/s of aspiration with Min A verbal cues for use of swallowing compensatory strategies. SLP Short Term Goal 4 (Week 2): Patient will demonstrate efficient mastication and complete oral clearance with trials of Dys. 3  textures over 2 sessions with Min A verbal cues prior to upgrade.  Weekly Progress Updates: Patient has made slow and minimal gains and has met 2 of 5 STGs this reporting period and progress has been limited by decreased participation. Currently, patient is consuming Dys. 2 textures with thin liquids with minimal overt s/s of aspiration with Max A verbal cues needed for encouragement with PO intake. Patient also demonstrates improved speech intelligibility and is usually only oriented to person, therefore, patient appears to be at her baseline in regards to orientation and speech intelligibility. However, ongoing intervention is needed for attention, ability to follow commands and swallowing function.  Patient and caregiver education ongoing. Patient would benefit from continued skilled SLP intervention to maximize her swallowing and cognitive function prior to discharge.     Intensity: Minumum of 1-2 x/day, 30 to 90 minutes Frequency: 3 to 5 out of 7 days Duration/Length of Stay: 3-3.5 weeks Treatment/Interventions: Cognitive remediation/compensation;Dysphagia/aspiration precaution training;Functional tasks;Internal/external aids;Speech/Language facilitation;Patient/family education;Cueing hierarchy;Environmental controls;Therapeutic Activities   Daily Session  Skilled Therapeutic Interventions: Skilled treatment session focused on dysphagia and cognitive goals. SLP facilitated session by providing extra time and overall Mod A verbal cues for patient to sit EOB to maximize proper positioning for PO intake. Patient consumed Dys. 1 textures with Min verbal cues needed for a slow rate of self-feeding resulting in subtle cough X 2. No overt s/s of aspiration noted with thin liquids. Patient declined all Dys. 2 textures on tray despite Max A  multimodal cues. Patient continues to demonstrate verbal perseveration with decreased attention to tasks requiring overall Mod A verbal cues.  Patient handed off to NT.  Continue with current plan of care.     Pain No/Denies Pain   Therapy/Group: Individual Therapy  Dencil Cayson 04/29/2019, 2:27 PM

## 2019-04-29 NOTE — Plan of Care (Signed)
  Problem: Consults Goal: RH GENERAL PATIENT EDUCATION Description: See Patient Education module for education specifics. Outcome: Progressing Goal: Skin Care Protocol Initiated - if Braden Score 18 or less Description: If consults are not indicated, leave blank or document N/A Outcome: Progressing Goal: Nutrition Consult-if indicated Outcome: Progressing Goal: Diabetes Guidelines if Diabetic/Glucose > 140 Description: If diabetic or lab glucose is > 140 mg/dl - Initiate Diabetes/Hyperglycemia Guidelines & Document Interventions  Outcome: Progressing   Problem: RH BOWEL ELIMINATION Goal: RH STG MANAGE BOWEL WITH ASSISTANCE Description: STG Manage Bowel with  modified Assistance. Outcome: Progressing Goal: RH STG MANAGE BOWEL W/MEDICATION W/ASSISTANCE Description: STG Manage Bowel with Medication with  modified Assistance. Outcome: Progressing   Problem: RH BLADDER ELIMINATION Goal: RH STG MANAGE BLADDER WITH ASSISTANCE Description: STG Manage Bladder With modified Assistance Outcome: Progressing Goal: RH STG MANAGE BLADDER WITH MEDICATION WITH ASSISTANCE Description: STG Manage Bladder With Medication With  modified Assistance. Outcome: Progressing Goal: RH STG MANAGE BLADDER WITH EQUIPMENT WITH ASSISTANCE Description: STG Manage Bladder With Equipment With modifiedAssistance Outcome: Progressing   Problem: RH SKIN INTEGRITY Goal: RH STG SKIN FREE OF INFECTION/BREAKDOWN Description: Skin free from infection entire stay on rehab Outcome: Progressing Goal: RH STG MAINTAIN SKIN INTEGRITY WITH ASSISTANCE Description: STG Maintain Skin Integrity With Assistance. Outcome: Progressing Goal: RH STG ABLE TO PERFORM INCISION/WOUND CARE W/ASSISTANCE Description: STG Able To Perform Incision/Wound Care With modified Assistance. Outcome: Progressing   Problem: RH PAIN MANAGEMENT Goal: RH STG PAIN MANAGED AT OR BELOW PT'S PAIN GOAL Outcome: Progressing   Problem: RH SAFETY Goal: RH  STG ADHERE TO SAFETY PRECAUTIONS W/ASSISTANCE/DEVICE Description: STG Adhere to Safety Precautions With modified Assistance/Device. Outcome: Progressing Goal: RH STG DECREASED RISK OF FALL WITH ASSISTANCE Description: STG Decreased Risk of Fall With modified  Assistance. Outcome: Progressing   Problem: RH KNOWLEDGE DEFICIT GENERAL Goal: RH STG INCREASE KNOWLEDGE OF SELF CARE AFTER HOSPITALIZATION Description: Mod I Outcome: Progressing

## 2019-04-29 NOTE — Progress Notes (Signed)
Occupational Therapy Session Note  Patient Details  Name: Rebecca Bruce MRN: [id number] Date of Birth: 18-Sep-1960  Today's Date: 04/29/2019 OT Individual Time: 1130-1140 OT Individual Time Calculation (min): 10 min    Short Term Goals: Week 1:  OT Short Term Goal 1 (Week 1): Pt will don shirt with mod A OT Short Term Goal 2 (Week 1): Pt will complete transfer to [facility] with mod A OT Short Term Goal 3 (Week 1): Pt will feed herself a meal with min-mod cueing and min A overall  Skilled Therapeutic Interventions/Progress Updates:    Pt received supine lethargic and asleep. Attempted to arouse pt, she would briefly interact with therapist and then fall back asleep. Initiated self care with pt briefly allowing therapist to wash face with washcloth before yelling no and falling back asleep. Pt too lethargic to participate meaningfully in session. Discussed with RN. 50 min missed.   Therapy Documentation Precautions:  Precautions Precautions: Fall Precaution Comments: intellectual disability at baseline Restrictions Weight Bearing Restrictions: No   Therapy/Group: Individual Therapy  Curtis Sites 04/29/2019, 6:51 AM

## 2019-04-29 NOTE — Progress Notes (Signed)
Pt slept 1-2 hours throughout the night. Pt having hallucinations, seeing people in the room when there is no one present.

## 2019-04-29 NOTE — Significant Event (Signed)
Hypoglycemic Event  CBG: 67  Treatment: 1 vanilla magic cup   Symptoms: Asymptomatic   Follow-up CBG: [id number]   CBG Result: 116  Possible Reasons for Event: pt refusing to eat   Comments/MD notified: provider will be notified     Rebecca [name]

## 2019-04-29 NOTE — Progress Notes (Signed)
Occupational Therapy Session Note  Patient Details  Name: Rebecca Bruce MRN: [id number] Date of Birth: 1961/02/15  Today's Date: 04/29/2019 OT Individual Time: 7416-3845 OT Individual Time Calculation (min): 25 min    Short Term Goals: Week 1:  OT Short Term Goal 1 (Week 1): Pt will don shirt with mod A OT Short Term Goal 2 (Week 1): Pt will complete transfer to [facility] with mod A OT Short Term Goal 3 (Week 1): Pt will feed herself a meal with min-mod cueing and min A overall  Skilled Therapeutic Interventions/Progress Updates:    Pt received supine with no c/o pain. Pt much more alert this session compared to previous session. Pt requested to come EOB and sit. Pt able to do so with min HHA. Pt completed UB bathing EOB with mod A overall. Pt began having BM and initially asked to transfer to [facility] but made no initiation to do so. Pt was cued to return to supine and able to do so with mod cueing overall (improved pace) and CGA. +2 present to help with rolling R and L for brief change. Max A overall for peri care and brief change. Pt was left supine with all needs met, bed alarm set.   Therapy Documentation Precautions:  Precautions Precautions: Fall Precaution Comments: intellectual disability at baseline Restrictions Weight Bearing Restrictions: No   Therapy/Group: Individual Therapy  Curtis Sites 04/29/2019, 4:57 PM

## 2019-04-29 NOTE — Progress Notes (Signed)
Physical Therapy Session Note  Patient Details  Name: Laketa Cangemi MRN: [id number] Date of Birth: 1960-10-15  Today's Date: 04/29/2019 PT Individual Time:  -      Short Term Goals: Week 1:  PT Short Term Goal 1 (Week 1): Pt will initiate transfer training PT Short Term Goal 2 (Week 1): Pt will initiate gait training PT Short Term Goal 3 (Week 1): Pt will participate in 3 hours of therapy per day  Skilled Therapeutic Interventions/Progress Updates: PTA attempted >15 min to engage pt to participate in therapy. Pt noted to be very lethargic stating "No" then nodding off. Discussed with nsg, pt having hallucinations and not sleeping much overnight. Missed 60 min skilled PT due to pt fatigue.       Therapy Documentation Precautions:  Precautions Precautions: Fall Precaution Comments: intellectual disability at baseline Restrictions Weight Bearing Restrictions: No General: PT Amount of Missed Time (min): 60 Minutes PT Missed Treatment Reason: Patient unwilling to participate;Patient fatigue Vital Signs: Therapy Vitals Temp: (!) 97.5 F (36.4 C) Pulse Rate: 79 Resp: 19 BP: 134/80 Patient Position (if appropriate): Lying Oxygen Therapy SpO2: 100 % O2 Device: Room Air   Therapy/Group: Individual Therapy  Winter Trefz  Brysin Towery, PTA  04/29/2019, 8:31 AM

## 2019-04-30 ENCOUNTER — Inpatient Hospital Stay (HOSPITAL_COMMUNITY): Payer: Medicare Other

## 2019-04-30 ENCOUNTER — Inpatient Hospital Stay (HOSPITAL_COMMUNITY): Payer: Medicare Other | Admitting: Speech Pathology

## 2019-04-30 ENCOUNTER — Inpatient Hospital Stay (HOSPITAL_COMMUNITY): Payer: Medicare Other | Admitting: Physical Therapy

## 2019-04-30 LAB — GLUCOSE, CAPILLARY
Glucose-Capillary: 82 mg/dL (ref 70–99)
Glucose-Capillary: 82 mg/dL (ref 70–99)
Glucose-Capillary: 79 mg/dL (ref 70–99)
Glucose-Capillary: 119 mg/dL — ABNORMAL HIGH (ref 70–99)

## 2019-04-30 NOTE — Progress Notes (Signed)
Physical Therapy Weekly Progress Note  Patient Details  Name: Rebecca Bruce MRN: [id number] Date of Birth: Dec 03, 1960  Beginning of progress report period: April 22, 2019 End of progress report period: April 30, 2019  Today's Date: 04/30/2019 PT Individual Time: 1315-1340 PT Individual Time Calculation (min): 25 min   Patient has met 2 of 3 short term goals. Pt has made minimal progress towards LTGs since evaluation. She is performing bed mobility w/ supervision-min assist w/ significantly increased time, however adamantly refuses attempts at any other mobility. She has stood 1-2 times w/ OT, however standing and initiating gait remain non-functional at this time. Refusal appears primarily behavioral 2/2 baseline cognitive deficits.   Patient continues to demonstrate the following deficits muscle weakness and muscle joint tightness, decreased cardiorespiratoy endurance, decreased initiation, decreased attention, decreased awareness, decreased problem solving, decreased safety awareness, decreased memory and delayed processing and decreased sitting balance and decreased balance strategies and therefore will continue to benefit from skilled PT intervention to increase functional independence with mobility.   I believe the pt would make the most progress in a familiar environment that is supportive of her unique needs. She is in an unfamiliar environment, w/ unfamiliar staff members, and lacks awareness and insight into her deficits and the purpose of rehab. Will continue to work towards safe d/c in her home environment [facility]).   Patient not progressing toward long term goals.  See goal revision..  Plan of care revisions: LTGs downgraded to mod assist transfers.  PT Short Term Goals Week 1:  PT Short Term Goal 1 (Week 1): Pt will initiate transfer training PT Short Term Goal 1 - Progress (Week 1): Met PT Short Term Goal 2 (Week 1): Pt will initiate gait training PT  Short Term Goal 2 - Progress (Week 1): Not progressing PT Short Term Goal 3 (Week 1): Pt will participate in 3 hours of therapy per day PT Short Term Goal 3 - Progress (Week 1): Met Week 2:  PT Short Term Goal 1 (Week 2): =LTGs due to ELOS  Skilled Therapeutic Interventions/Progress Updates:   Session focused on trial-ing hoyer lift to recliner for potential use at home. Pt yelling out "no!" multiple times as therapist placed sling. Pt refused to roll so therapist had to slide sling under bottom. Pt denied she was in pain when asked and therapist gave multiple rest breaks for her to calm down. Pt responds well to holding her hand and reassurance. Pt did allow therapist to attach sling to lift, however yelled "no!" repeatedly during the transfer to recliner. Pt denied any pain just reported "I'm scared". Despite this, pt was safe in hoyer sling and it would be a safe option for pt to transfer at home w/ caregiver. Anticipate pt's agitation will also improve w/ assistance of a familiar caregiver. Pt requesting to return to bed and was then very motivated to do so. Pt allowed therapist to physically assist her both under arm and boosting at bottom. Pt needed mod-max assist to boost into stance at bottom, pt also took a few steps in attempts to pivot, but ultimately needed max assist to swing bottom onto bed via squat pivot. Pt was very excited she performed transfer and said "aren't you proud of me?" Sit>supine w/ min assist and pt requesting physical assistance from therapist. Ended session in supine, all needs in reach. Did not attempt further activity 2/2 agitation w/ activity this session. Missed 35 min of skilled PT.   Therapy Documentation Precautions:  Precautions Precautions: Fall Precaution Comments: intellectual disability at baseline Restrictions Weight Bearing Restrictions: No  Therapy/Group: Individual Therapy  Dalores Weger Melton Krebs 04/30/2019, 4:58 PM

## 2019-04-30 NOTE — Progress Notes (Signed)
Speech Language Pathology Daily Session Note  Patient Details  Name: Rebecca Bruce MRN: [id number] Date of Birth: 12/16/1960  Today's Date: 04/30/2019 SLP Individual Time: [id number] SLP Individual Time Calculation (min): 25 min  Short Term Goals: Week 2: SLP Short Term Goal 1 (Week 2): Pt will demonstrate sustained attention for 2 minute intervals with max A verbal cues for redirection. SLP Short Term Goal 2 (Week 2): Pt will follow basic 1 step commands in functional tasks with 70% accuracy given max A verbal cues. SLP Short Term Goal 3 (Week 2): Patient will consume current diet with minimal overt s/s of aspiration with Min A verbal cues for use of swallowing compensatory strategies. SLP Short Term Goal 4 (Week 2): Patient will demonstrate efficient mastication and complete oral clearance with trials of Dys. 3 textures over 2 sessions with Min A verbal cues prior to upgrade.  Skilled Therapeutic Interventions: Skilled treatment session focused on cognitive goals. SLP facilitated session by providing Min A verbal cues for sustained attention to a basic card task. Patient also recalled that it was this SLP's birthday today with overall supervision level verbal cues. Overall, patient with increased attention to task today, however, patient also appeared to demonstrate increased fatigue. Patient left supine in bed with alarm on and all needs within reach. Continue with current plan of care.      Pain No/Denies Pain   Therapy/Group: Individual Therapy  Rebecca Bruce 04/30/2019, 2:27 PM

## 2019-04-30 NOTE — Progress Notes (Signed)
Occupational Therapy Weekly Progress Note  Patient Details  Name: Rebecca Bruce MRN: [id number] Date of Birth: 1960-10-28  Beginning of progress report period: April 22, 2019 End of progress report period: April 30, 2019  Today's Date: 04/30/2019 OT Individual Time: 1105-1200 OT Individual Time Calculation (min): 55 min    Patient has met 1 of 3 short term goals.  Pt's caregiver has not been present for the majority of sessions. When she is present, there is not a significant change in participation, despite previous claims it would do so. Pt still requires mod--max A for all ADLs and has yet to complete any transfers. Pt has completed 2x sit <> stands with mod A, without full extension. The therapy team is discussing next steps and d/c to preferred/familiar location.   Patient continues to demonstrate the following deficits: muscle weakness, decreased cardiorespiratoy endurance, decreased initiation, decreased attention, decreased awareness, decreased problem solving, decreased safety awareness, decreased memory and delayed processing and decreased sitting balance, decreased standing balance and decreased balance strategies and therefore will continue to benefit from skilled OT intervention to enhance overall performance with BADL and Reduce care partner burden.   Patient not progressing toward long term goals.  See goal revision..  Plan of care revisions: Goals will be downgraded following discussion with group home to assess level of care available. .  OT Short Term Goals Week 1:  OT Short Term Goal 1 (Week 1): Pt will don shirt with mod A OT Short Term Goal 1 - Progress (Week 1): Not met OT Short Term Goal 2 (Week 1): Pt will complete transfer to [facility] with mod A OT Short Term Goal 2 - Progress (Week 1): Not met OT Short Term Goal 3 (Week 1): Pt will feed herself a meal with min-mod cueing and min A overall OT Short Term Goal 3 - Progress (Week 1): Met Week 2:  OT Short Term  Goal 1 (Week 2): Pt will complete 1 functional transfer with mod-max A OT Short Term Goal 2 (Week 2): Pt will complete bed mobility with only min-mod cueing for initiation OT Short Term Goal 3 (Week 2): Pt will complete grooming tasks EOB with set up assist  Skilled Therapeutic Interventions/Progress Updates:    Pt received supine with no c/o pain. Pt completed bed mobiltiy to EOB with min A, with mod cueing overall. At EOB pt completed UB bathing with mod A overall. Min A to don new gown. Mod A to wash LB. Tactile and verbal cueing required for following commands and for initiation. Pt assisted in foot care with copious amounts of dry skin flaking off. With brief donned around her legs and pt instructed that she needed to change the brief, pt stood x2 with mod A with recliner anteriorly positioned. Pt able to remain standing with ~60% hip/trunk extension with mod A. Total A required to doff old and don new brief. Pt returned to sitting EOB. Recliner was set up with slideboard and pt allowed one small scoot before refusing to use slideboard further. Slideboard removed and pt briefly initiated lateral scoot to recliner with arm rest down. Pt about halfway to recliner with she began yelling and refusing to go any further. Pt flung herself back to bed, now prone with assistance required to bring BLE into bed. With instruction pt able to return to supine in bed with min A. Pt was left supine with all needs met, bed alarm set.   Therapy Documentation Precautions:  Precautions Precautions: Fall Precaution Comments:  intellectual disability at baseline Restrictions Weight Bearing Restrictions: No   Therapy/Group: Individual Therapy  Curtis Sites 04/30/2019, 7:26 AM

## 2019-04-30 NOTE — Plan of Care (Signed)
  Problem: RH Bed to Chair Transfers Goal: LTG Patient will perform bed/chair transfers w/assist (PT) Description: LTG: Patient will perform bed to chair transfers with assistance (PT). Flowsheets (Taken 04/30/2019 1656) LTG: Pt will perform Bed to Chair Transfers with assistance level: (downgraded 1/19 due to lack of progress - Cyrilla Durkin T) Moderate Assistance - Patient 50 - 74% Note: downgraded 1/19 due to lack of progress - Jeevan Kalla T   Problem: RH Car Transfers Goal: LTG Patient will perform car transfers with assist (PT) Description: LTG: Patient will perform car transfers with assistance (PT). Flowsheets (Taken 04/30/2019 1656) LTG: Pt will perform car transfers with assist:: (downgraded 1/19 due to lack of progress - Paxtyn Boyar T) Moderate Assistance - Patient 50 - 74% Note: downgraded 1/19 due to lack of progress - Anuar Walgren T   Problem: RH Furniture Transfers Goal: LTG Patient will perform furniture transfers w/assist (OT/PT) Description: LTG: Patient will perform furniture transfers  with assistance (OT/PT). Outcome: Not Applicable Flowsheets (Taken 04/30/2019 1656) LTG: Pt will perform furniture transfers with assist:: (discontinued 1/19 due to lack of progress - Shalimar Mcclain T) -- Note: Discontinued 1/19 due to lack of progress - Joua Bake T

## 2019-04-30 NOTE — Progress Notes (Signed)
[place] PHYSICAL MEDICINE & REHABILITATION PROGRESS NOTE   Subjective/Complaints: Up in bed. Slept well again. Denies pain. Ate better at dinner and breakfast this morning.   ROS: Limited due to cognitive/behavioral   Objective:   No results found. No results for input(s): WBC, HGB, HCT, PLT in the last 72 hours. Recent Labs    04/29/19 0921  NA 141  K 3.9  CL 105  CO2 27  GLUCOSE 93  BUN 11  CREATININE 0.94  CALCIUM 9.2    Intake/Output Summary (Last 24 hours) at 04/30/2019 [id number] Last data filed at 04/30/2019 0730 Gross per 24 hour  Intake 1380 ml  Output --  Net 1380 ml     Physical Exam: Vital Signs Blood pressure 123/76, pulse 85, temperature 97.6 F (36.4 C), resp. rate 19, weight 81.7 kg, SpO2 100 %. Constitutional: No distress . Vital signs reviewed. HEENT: EOMI, oral membranes moist Neck: supple Cardiovascular: RRR without murmur. No JVD    Respiratory: CTA Bilaterally without wheezes or rales. Normal effort    GI: BS +, non-tender, non-distended  Skin: Warm and dry.  Intact. Psych: flat but engages. Musc: No edema in extremities.  No tenderness in extremities. Neurologic: Alert, says hello. Follows basic commands, inconsistent. Limited insight Dysarthria Motor: Bilateral upper extremities: Grossly 4/5 proximal distal   Assessment/Plan: 1. Functional deficits secondary to covid encephalopathy which require 3+ hours per day of interdisciplinary therapy in a comprehensive inpatient rehab setting.  Physiatrist is providing close team supervision and 24 hour management of active medical problems listed below.  Physiatrist and rehab team continue to assess barriers to discharge/monitor patient progress toward functional and medical goals  Care Tool:  Bathing    Body parts bathed by patient: Chest, Abdomen, Face   Body parts bathed by helper: Right arm, Left arm, Left upper leg, Right upper leg, Right lower leg, Left lower leg, Front perineal area,  Buttocks     Bathing assist Assist Level: Maximal Assistance - Patient 24 - 49%     Upper Body Dressing/Undressing Upper body dressing   What is the patient wearing?: Hospital gown only    Upper body assist Assist Level: Maximal Assistance - Patient 25 - 49%    Lower Body Dressing/Undressing Lower body dressing    Lower body dressing activity did not occur: Refused What is the patient wearing?: Incontinence brief     Lower body assist Assist for lower body dressing: Maximal Assistance - Patient 25 - 49%     Toileting Toileting Toileting Activity did not occur Landscape architect and hygiene only): Refused  Toileting assist Assist for toileting: Dependent - Patient 0%     Transfers Chair/bed transfer  Transfers assist  Chair/bed transfer activity did not occur: Refused  Chair/bed transfer assist level: Moderate Assistance - Patient 50 - 74%     Locomotion Ambulation   Ambulation assist   Ambulation activity did not occur: Refused          Walk 10 feet activity   Assist  Walk 10 feet activity did not occur: Refused        Walk 50 feet activity   Assist Walk 50 feet with 2 turns activity did not occur: Refused         Walk 150 feet activity   Assist Walk 150 feet activity did not occur: Refused         Walk 10 feet on uneven surface  activity   Assist Walk 10 feet on uneven surfaces activity did  not occur: Armed forces operational officer activity did not occur: Refused         Wheelchair 50 feet with 2 turns activity    Assist    Wheelchair 50 feet with 2 turns activity did not occur: Refused       Wheelchair 150 feet activity     Assist  Wheelchair 150 feet activity did not occur: Refused       Blood pressure 123/76, pulse 85, temperature 97.6 F (36.4 C), resp. rate 19, weight 81.7 kg, SpO2 100 %.  Medical Problem List and Plan: 1. Debility and encephalopathy secondary  to Covid 19  Continue CIR  -team conf today. Participation inconsistent.  2. Antithrombotics: -DVT/anticoagulation:Mechanical:Sequential compression devices, below kneeBilateral lower extremities.   -continue lovenox 40mg  daily -antiplatelet therapy: N/A 3. Pain Management:Was on Gabapentin 300 mg/hs PTA. Tylenol prn for now. 4. Mood:  consistent schedule/familiar faces to help with security/compliance  -suspect cognition is at baseline -antipsychotic agents: N/A 5. Neuropsych: This patientis not capable of making decisions on herown behalf.  6. Skin/Wound Care:Routine pressure relief measure.  -  eucerin cream for dry feet 7. Fluids/Electrolytes/Nutrition:encourage PO, monitor for oral pocketing.   Hypokalemia-potassium 3.9  -po intake poor, albumin only 2.8 (incr from last reading however( 8. Multifactorial encephalopathy post Covid: Catatonia has resolved.   - agitation better  -cognition likely close to baseline 9. ABLA: Has been transfused with 4 units PRBC.  Continue to monitor with serial checks.   Hemoglobin 10.9 on 1/14    10. Malnutrition: Intake limited to certain foods--offer supplements thorough out the day.   1/11 -added megace as appetite stimulant, haven't seen a lot of progress however  1/18 P.o. intake poor to inconsistent   -dc megace as it may be contributing to loose stool, incontinence 11. Multilevel Cervical stenosis: Follow up with Dr. Annette Stable after discharge.  12. H/o major depressive disorder: home paxil resumed   13. Prediabetes:   Relatively controlled on 1/18 14. Elevated LFT's: trending down 1/18 15. Persistent leukocytosis: 11/1 16.1  WBCs 14.2 on 1/14   Afebrile   Recheck Thursday 16. Dysphagia: D2 thin diet  -advance per SLP, advancing consistency may help with intake   LOS: 9 days A FACE TO FACE EVALUATION WAS PERFORMED  [name] 04/30/2019, 9:22 AM

## 2019-04-30 NOTE — Plan of Care (Signed)
  Problem: Consults Goal: RH GENERAL PATIENT EDUCATION Description: See Patient Education module for education specifics. Outcome: Progressing Goal: Skin Care Protocol Initiated - if Braden Score 18 or less Description: If consults are not indicated, leave blank or document N/A Outcome: Progressing Goal: Nutrition Consult-if indicated Outcome: Progressing Goal: Diabetes Guidelines if Diabetic/Glucose > 140 Description: If diabetic or lab glucose is > 140 mg/dl - Initiate Diabetes/Hyperglycemia Guidelines & Document Interventions  Outcome: Progressing   Problem: RH BOWEL ELIMINATION Goal: RH STG MANAGE BOWEL WITH ASSISTANCE Description: STG Manage Bowel with  modified Assistance. Outcome: Progressing Goal: RH STG MANAGE BOWEL W/MEDICATION W/ASSISTANCE Description: STG Manage Bowel with Medication with  modified Assistance. Outcome: Progressing   Problem: RH BLADDER ELIMINATION Goal: RH STG MANAGE BLADDER WITH ASSISTANCE Description: STG Manage Bladder With modified Assistance Outcome: Progressing Goal: RH STG MANAGE BLADDER WITH MEDICATION WITH ASSISTANCE Description: STG Manage Bladder With Medication With  modified Assistance. Outcome: Progressing Goal: RH STG MANAGE BLADDER WITH EQUIPMENT WITH ASSISTANCE Description: STG Manage Bladder With Equipment With modifiedAssistance Outcome: Progressing   Problem: RH SKIN INTEGRITY Goal: RH STG SKIN FREE OF INFECTION/BREAKDOWN Description: Skin free from infection entire stay on rehab Outcome: Progressing Goal: RH STG MAINTAIN SKIN INTEGRITY WITH ASSISTANCE Description: STG Maintain Skin Integrity With Assistance. Outcome: Progressing Goal: RH STG ABLE TO PERFORM INCISION/WOUND CARE W/ASSISTANCE Description: STG Able To Perform Incision/Wound Care With modified Assistance. Outcome: Progressing   Problem: RH SAFETY Goal: RH STG ADHERE TO SAFETY PRECAUTIONS W/ASSISTANCE/DEVICE Description: STG Adhere to Safety Precautions With  modified Assistance/Device. Outcome: Progressing Goal: RH STG DECREASED RISK OF FALL WITH ASSISTANCE Description: STG Decreased Risk of Fall With modified  Assistance. Outcome: Progressing   Problem: RH PAIN MANAGEMENT Goal: RH STG PAIN MANAGED AT OR BELOW PT'S PAIN GOAL Outcome: Progressing   Problem: RH KNOWLEDGE DEFICIT GENERAL Goal: RH STG INCREASE KNOWLEDGE OF SELF CARE AFTER HOSPITALIZATION Description: Mod I Outcome: Progressing

## 2019-04-30 NOTE — Patient Care Conference (Signed)
Inpatient RehabilitationTeam Conference and Plan of Care Update Date: 04/30/2019   Time: 10:25 AM    Patient Name: Rebecca Bruce      Medical Record Number: [id number]  Date of Birth: 1960-07-26 Sex: Female         Room/Bed: 4M09C/4M09C-01 Payor Info: Payor: MEDICARE / Plan: MEDICARE PART A AND B / Product Type: *No Product type* /    Admit Date/Time:  04/21/2019  5:20 PM  Primary Diagnosis:  Encephalopathy  Patient Active Problem List   Diagnosis Date Noted  . Blood pressure increase diastolic   . Leukocytosis   . Transaminitis   . Prediabetes   . Hypokalemia   . Dysphagia 04/24/2019  . Encephalopathy 04/21/2019  . FTT (failure to thrive) in adult 04/09/2019  . Acute blood loss anemia   . Hemorrhagic shock ([place])   . Pressure injury of skin 03/28/2019  . Encounter for intubation   . Catatonia   . Acute respiratory failure with hypoxemia ([place])   . COVID-19 virus detected   . Acute metabolic encephalopathy 40/98/1191  . Acute cystitis without hematuria   . COVID-19 virus infection   . Altered mental state 03/09/2019  . Sepsis secondary to UTI ([place]) 03/09/2019  . 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 Hyperglycemia 07/13/2018  . Pre-diabetes 01/05/2017  . Other symptoms and signs involving cognitive functions and awareness 09/01/2015  . Dyslipidemia 07/02/2015  . History of hyperthyroidism 07/02/2015  . History of radioactive iodine thyroid ablation 07/02/2015  . Hypothyroidism (acquired) 07/02/2015  . Obesity 07/02/2015  . Hypertension, benign 07/02/2015  . Anxiety 07/02/2015  . Vitamin D deficiency 07/02/2015  . Insomnia 07/02/2015  . OA (osteoarthritis) of knee 07/02/2015  . GERD (gastroesophageal reflux disease) 07/02/2015  . Hyperlipidemia, unspecified 07/02/2015    Expected Discharge Date: Expected Discharge Date: (TBD)  Team Members Present: Physician leading conference: Dr. Alger Simons Social Worker Present: Lennart Pall, LCSW Nurse Present:  Dorien Chihuahua, RN;Other (comment)(Susan Truman Hayward, RN) Case Manager: Karene Fry, RN PT Present: Burnard Bunting, PT OT Present: Laverle Hobby, OT SLP Present: Weston Anna, SLP PPS Coordinator present : Gunnar Fusi, Novella Olive, PT     Current Status/Progress Goal Weekly Team Focus  Bowel/Bladder   pt is incontinent of b/b. LBM 04/29/19  Baseline toileting pattern  Assess Qshift/PRN. Educate pt on importance of doing peri care and using the toilet.   Swallow/Nutrition/ Hydration   Dys. 2 textures with thin liquids, Min A  Supervision  reduce pocketing and increase PO intake   ADL's   Mod-max A ADLs overall. Pt limited by behavior and cognition. Caregiver not present consistently nor does she have a big impact on participation. has not yet completed a transfer  (S) overall  ADL retraining, OOB encouragement, functional activity tolerance, building rapport   Mobility   can be supervision w/ extra time for bed mobility, but she often refuses even that, have not been able to initiate standing or transfers yet despite max encouragement  supervision bed mobility and transfers, no gait goal yet  participation, initiating standing and transfers, apraxia, and attention to task   Communication   At baseline  Supervision A  Goal Met   Safety/Cognition/ Behavioral Observations  Max A for sustained attention  Min A  sustained attention   Pain   Pt denies being in pain.  Pt will be pain free  assess pain qshift/PRN   Skin   Skin is dry, Stage 2- buttocks w/foam.  Pt will be free of further skin  breakdown and infection.  Assess pain qshift/prn    Rehab Goals Patient on target to meet rehab goals: No Rehab Goals Revised: limited gains *See Care Plan and progress notes for long and short-term goals.     Barriers to Discharge  Current Status/Progress Possible Resolutions Date Resolved   Nursing                  PT                    OT                  SLP                SW                 Discharge Planning/Teaching Needs:  Pt to return to group home if able to reach supervision goals.  Teaching to be completed with group home staff.   Team Discussion: Cognition at baseline, working on nutrition, poor intake, loose stools.  RN was hallucinating 2 nights ago,  behavior better, BM today.  OT caregiver not present, mod/max overall.  PT hoyer lift try today, mod/max.  SLP D3thins, downgrade to D2, speech and cognition at baseline.  SW to call group home, need to downgrade goals, goals are at S level.   Revisions to Treatment Plan: N/A     Medical Summary Current Status: encephalopathy, cognitively near baseline? intake inconsistent to poor. loose incontinent stool Weekly Focus/Goal: persistent cognitive/behavioral deficits  Barriers to Discharge: Behavior;Nutrition means       Continued Need for Acute Rehabilitation Level of Care: The patient requires daily medical management by a physician with specialized training in physical medicine and rehabilitation for the following reasons: Direction of a multidisciplinary physical rehabilitation program to maximize functional independence : Yes Medical management of patient stability for increased activity during participation in an intensive rehabilitation regime.: Yes Analysis of laboratory values and/or radiology reports with any subsequent need for medication adjustment and/or medical intervention. : Yes   I attest that I was present, lead the team conference, and concur with the assessment and plan of the team.   Retta Diones 04/30/2019, 6:53 PM   Team conference was held via web/ teleconference due to [place] - 19

## 2019-04-30 NOTE — Progress Notes (Signed)
Physical Therapy Session Note  Patient Details  Name: Rebecca Bruce MRN: [id number] Date of Birth: [birth year]  Today's Date: 04/30/2019 PT Individual Time: [id number] PT Individual Time Calculation (min): 30 min   Short Term Goals: Week 1:  PT Short Term Goal 1 (Week 1): Pt will initiate transfer training PT Short Term Goal 2 (Week 1): Pt will initiate gait training PT Short Term Goal 3 (Week 1): Pt will participate in 3 hours of therapy per day  Skilled Therapeutic Interventions/Progress Updates:    Pt received supine in bed with HOB elevated and upon questioning pt initially not agreeable to therapy session just replying "no" but with distraction and redirecting patient agreeable to sit EOB and participate in ball tossing activity. Supine>sit with min assist for B LE management and pt using bedrail for trunk support. Performed ball toss for ~2 minutes focusing on activity tolerance while therapist building rapport with the patient. Pt then reporting need to use bathroom. Placed BSC next to bed and placed RW in front of pt to attempt stand pivot transfer. Pt able to scoot hips closer to EOB with min assist and significantly increased time as she would continue to shout "no" when therapist attempted to assist her and then <30seconds later question "are you going to help me?." Attempted sit>stand elevated EOB>RW but pt continued to demonstrate poor motor planning to initiate the transfer with her not leaning forward nor activating LE muscles to lift hips despite max cuing for sequencing - again pt shouts "no" when therapist attempts to help her lift her hips but also asks seconds later for therapist's help then stating "I love you.Marland Kitchendo you love me?" Pt then requesting to have recliner for UE support - therapist positioned this in front of pt but again she demonstrated poor motor planning and despite max cuing pt unable to initiate lifting her hips from EOB. Then tried the stedy and placed it in  front of the patient directing her to pull up on the bar without pt attempting she replied "this won't work" and despite max encouragement pt not agreeable to attempt it at this time. Sit>supine with mod assist for B LE management in order to perform supine toileting but then pt noted to have been incontinent of bladder. Nursing staff notified and present to assume care of patient.  Therapy Documentation Precautions:  Precautions Precautions: Fall Precaution Comments: intellectual disability at baseline Restrictions Weight Bearing Restrictions: No  Pain:   Appeared to have sensitivity in her feet but difficult to determine.    Therapy/Group: Individual Therapy  Tawana Scale, PT, DPT 04/30/2019, 2:33 PM

## 2019-04-30 NOTE — Progress Notes (Signed)
Pt fed her self 1 vanilla magic cup. Pt slept well through out the night. No signs of pain pr distress noted. Pt up, resting in bed at this time.

## 2019-04-30 NOTE — Plan of Care (Signed)
  Problem: RH Swallowing Goal: LTG Pt will demonstrate functional change in swallow as evidenced by bedside/clinical objective assessment (SLP) Description: LTG: Patient will demonstrate functional change in swallow as evidenced by bedside/clinical objective assessment (SLP) Outcome: Not Applicable   Problem: RH Cognition - SLP Goal: RH LTG Patient will demonstrate orientation with cues Description:  LTG:  Patient will demonstrate orientation to person/place/time/situation with cues (SLP)   Outcome: Not Applicable   Problem: RH Swallowing Goal: LTG Patient will consume least restrictive diet using compensatory strategies with assistance (SLP) Description: LTG:  Patient will consume least restrictive diet using compensatory strategies with assistance (SLP) Flowsheets (Taken 04/30/2019 1022) LTG: Pt Patient will consume least restrictive diet using compensatory strategies with assistance of (SLP): (downgraded 1/19) Supervision Goal: LTG Patient will participate in dysphagia therapy to increase swallow function with assistance (SLP) Description: LTG:  Patient will participate in dysphagia therapy to increase swallow function with assistance (SLP) Flowsheets (Taken 04/30/2019 1022) LTG: Pt will participate in dysphagia therapy to increase swallow function with assistance of (SLP): (downgraded 1/19) Supervision

## 2019-05-01 ENCOUNTER — Inpatient Hospital Stay (HOSPITAL_COMMUNITY): Payer: Medicare Other | Admitting: Occupational Therapy

## 2019-05-01 ENCOUNTER — Inpatient Hospital Stay (HOSPITAL_COMMUNITY): Payer: Medicare Other

## 2019-05-01 ENCOUNTER — Inpatient Hospital Stay (HOSPITAL_COMMUNITY): Payer: Medicare Other | Admitting: Speech Pathology

## 2019-05-01 ENCOUNTER — Inpatient Hospital Stay (HOSPITAL_COMMUNITY): Payer: Medicare Other | Admitting: Physical Therapy

## 2019-05-01 LAB — GLUCOSE, CAPILLARY
Glucose-Capillary: 85 mg/dL (ref 70–99)
Glucose-Capillary: 98 mg/dL (ref 70–99)
Glucose-Capillary: 68 mg/dL — ABNORMAL LOW (ref 70–99)
Glucose-Capillary: 130 mg/dL — ABNORMAL HIGH (ref 70–99)
Glucose-Capillary: 142 mg/dL — ABNORMAL HIGH (ref 70–99)

## 2019-05-01 NOTE — Progress Notes (Signed)
Speech Language Pathology Daily Session Note  Patient Details  Name: Rebecca Bruce MRN: [id number] Date of Birth: [birth year]  Today's Date: 05/01/2019 SLP Individual Time: 1230-1255 SLP Individual Time Calculation (min): 25 min  Short Term Goals: Week 2: SLP Short Term Goal 1 (Week 2): Pt will demonstrate sustained attention for 2 minute intervals with max A verbal cues for redirection. SLP Short Term Goal 2 (Week 2): Pt will follow basic 1 step commands in functional tasks with 70% accuracy given max A verbal cues. SLP Short Term Goal 3 (Week 2): Patient will consume current diet with minimal overt s/s of aspiration with Min A verbal cues for use of swallowing compensatory strategies. SLP Short Term Goal 4 (Week 2): Patient will demonstrate efficient mastication and complete oral clearance with trials of Dys. 3 textures over 2 sessions with Min A verbal cues prior to upgrade.  Skilled Therapeutic Interventions: Skilled treatment session focused on dysphagia and cognitive goals. SLP facilitated session by providing skilled observation with lunch meal of Dys. 2 textures with thin liquids. Patient self-fed her meal and sustained attention to self-feeding for ~20 minutes with supervision verbal cues for redirection. Patient only consumed Dys. 1 textures despite encouragement to consume Dys. 2 textures and trials of Dys. 3 textures without overt s/s of aspiration. Recommend patient continue current diet. Patient left upright in recliner with all needs within reach. Continue with current plan of care.      Pain No/Denies Pain   Therapy/Group: Individual Therapy  Danyal Whitenack 05/01/2019, 2:42 PM

## 2019-05-01 NOTE — Progress Notes (Signed)
Occupational Therapy Session Note  Patient Details  Name: Rebecca Bruce MRN: [id number] Date of Birth: [birth year]  Today's Date: 05/01/2019 OT Individual Time: [id number] OT Individual Time Calculation (min): 30 min    Short Term Goals: Week 2:  OT Short Term Goal 1 (Week 2): Pt will complete 1 functional transfer with mod-max A OT Short Term Goal 2 (Week 2): Pt will complete bed mobility with only min-mod cueing for initiation OT Short Term Goal 3 (Week 2): Pt will complete grooming tasks EOB with set up assist  Skilled Therapeutic Interventions/Progress Updates:    Pt in bed to start session with nursing present to administer medication.  Once complete, therapist engaged in conversation about her current TV program and also provided washcloth for washing her face.  She was able to wash her face with min assist for thoroughness.  She then agreed to transfer to the edge of the bed, requiring min assist to complete.  Therapist persuaded her to work on ball toss, however he asked her to transfer to the recliner first.  Total assist needed for squat pivot transfer with pt yelling out initially that her arm hurt during the transfer.  Once seated she stated it didn't.  She was able to scoot to the back of the recliner with supervision.  She then engaged in ball toss and catch task from seated position.  She was not oriented to place or situation, which is likely her baseline.  Safety belt was applied and pt's LEs were elevated to conclude session.  Nursing made aware that pt is up in the wheelchair.    Therapy Documentation Precautions:  Precautions Precautions: Fall Precaution Comments: intellectual disability at baseline Restrictions Weight Bearing Restrictions: No  Pain: Pain Assessment Pain Scale: Faces Pain Score: 0-No pain  Therapy/Group: Individual Therapy  Cherlynn Popiel OTR/L 05/01/2019, 12:28 PM

## 2019-05-01 NOTE — Progress Notes (Addendum)
Physical Therapy Session Note  Patient Details  Name: Rebecca Bruce MRN: [id number] Date of Birth: Oct 04, 1960  Today's Date: 05/01/2019 PT Individual Time: 1300-1400 PT Individual Time Calculation (min): 60 min   Short Term Goals: Week 2:  PT Short Term Goal 1 (Week 2): =LTGs due to ELOS  Skilled Therapeutic Interventions/Progress Updates:   Pt received in recliner, agreeable to therapy and no c/o pain. Pt requesting to walk to bathroom. Spent ~ 5 min encouraging pt to stand up w/ verbal/tactile/visual cues for technique, sequencing, motor planning, and initiation. Pt refused therapist's physical assist. Pt would initiate standing, however minimally scoot forward and state "aren't you proud of me?". Pt also w/ low frustration tolerance and attention today, she would attempt to stand, realize she couldn't perform ask w/o assist, and then become frustrated and distracted by sounds in hallway. Pt then incontinent of bowel and bladder in brief. Pt agreeable to stand while therapist performs brief management and pericare. Sit>stand w/ mod assist and significantly increased time 2/2 cues mentioned above. Pt unable to reach full upright standing and utilizing armrests of chair in front of her for support. Mod assist needed to boost at bottom, close supervision-CGA for static stance for 45-60 sec while therapist performed pericare. Sat back down for a rest break before standing a 2nd time via same technique. Pt required close to 15-20 min of encouragement before she stood a 2nd time w/ mod assist to boost at bottom. Pt allowed therapist to don brief, but not fasten it. Pt returned to sitting before therapist could finish and brief was fastened sitting down. Pt then refusing any further activity despite max encouragement. Therapist was able to place 1/2 of chair alarm pad under pt's bottom for safety. Ended session in recliner, all needs in reach. NT and RN made aware of pt's status. Missed 15 min of  skilled PT 2/2 refusal.   Therapy Documentation Precautions:  Precautions Precautions: Fall Precaution Comments: intellectual disability at baseline Restrictions Weight Bearing Restrictions: No General: PT Amount of Missed Time (min): 15 Minutes PT Missed Treatment Reason: Patient unwilling to participate Pain: Pain Assessment Pain Scale: Faces Pain Score: 0-No pain  Therapy/Group: Individual Therapy  Dhruva Orndoff Clent Demark 05/01/2019, 2:11 PM

## 2019-05-01 NOTE — Plan of Care (Signed)
  Problem: Consults Goal: RH GENERAL PATIENT EDUCATION Description: See Patient Education module for education specifics. Outcome: Progressing Goal: Skin Care Protocol Initiated - if Braden Score 18 or less Description: If consults are not indicated, leave blank or document N/A Outcome: Progressing Goal: Nutrition Consult-if indicated Outcome: Progressing Goal: Diabetes Guidelines if Diabetic/Glucose > 140 Description: If diabetic or lab glucose is > 140 mg/dl - Initiate Diabetes/Hyperglycemia Guidelines & Document Interventions  Outcome: Progressing   Problem: RH BOWEL ELIMINATION Goal: RH STG MANAGE BOWEL WITH ASSISTANCE Description: STG Manage Bowel with  modified Assistance. Outcome: Progressing Goal: RH STG MANAGE BOWEL W/MEDICATION W/ASSISTANCE Description: STG Manage Bowel with Medication with  modified Assistance. Outcome: Progressing   Problem: RH BLADDER ELIMINATION Goal: RH STG MANAGE BLADDER WITH ASSISTANCE Description: STG Manage Bladder With modified Assistance Outcome: Progressing Goal: RH STG MANAGE BLADDER WITH MEDICATION WITH ASSISTANCE Description: STG Manage Bladder With Medication With  modified Assistance. Outcome: Progressing Goal: RH STG MANAGE BLADDER WITH EQUIPMENT WITH ASSISTANCE Description: STG Manage Bladder With Equipment With modifiedAssistance Outcome: Progressing   Problem: RH SAFETY Goal: RH STG ADHERE TO SAFETY PRECAUTIONS W/ASSISTANCE/DEVICE Description: STG Adhere to Safety Precautions With modified Assistance/Device. Outcome: Progressing Goal: RH STG DECREASED RISK OF FALL WITH ASSISTANCE Description: STG Decreased Risk of Fall With modified  Assistance. Outcome: Progressing   Problem: RH KNOWLEDGE DEFICIT GENERAL Goal: RH STG INCREASE KNOWLEDGE OF SELF CARE AFTER HOSPITALIZATION Description: Mod I Outcome: Progressing

## 2019-05-01 NOTE — Progress Notes (Signed)
Occupational Therapy Session Note  Patient Details  Name: Rebecca Bruce MRN: [id number] Date of Birth: 30-Jul-1960  Today's Date: 05/01/2019 OT Individual Time: 1100-1158 OT Individual Time Calculation (min): 58 min    Short Term Goals: Week 1:  OT Short Term Goal 1 (Week 1): Pt will don shirt with mod A OT Short Term Goal 1 - Progress (Week 1): Not met OT Short Term Goal 2 (Week 1): Pt will complete transfer to [facility] with mod A OT Short Term Goal 2 - Progress (Week 1): Not met OT Short Term Goal 3 (Week 1): Pt will feed herself a meal with min-mod cueing and min A overall OT Short Term Goal 3 - Progress (Week 1): Met  Skilled Therapeutic Interventions/Progress Updates:    1:1. Pt received in recliner with belt alarm off to the side by bed. Pt perseverative on not liking belt alarm. Pt agreeable to washing at sink. Pt completes bathing with VC for crossing LEs into figure 4. Pt able to doff socks and requires MIN A to maintain figure 4 to don new socks after washing feet. Pt requires max VC for sequencing bathing and dressing. OT applies new brief around LEs and PT sit to stand at sink with severely forward trunk flexion nearly laying on sink while OT cleanses buttocks after incontinent BM. Exited session with pt seated in recliner, chair pad alarm on and call light in reach  Therapy Documentation Precautions:  Precautions Precautions: Fall Precaution Comments: intellectual disability at baseline Restrictions Weight Bearing Restrictions: No General:   Vital Signs:  Pain:   ADL:   Vision   Perception    Praxis   Exercises:   Other Treatments:     Therapy/Group: Individual Therapy  Tonny Branch 05/01/2019, 12:01 PM

## 2019-05-01 NOTE — Progress Notes (Signed)
[place] PHYSICAL MEDICINE & REHABILITATION PROGRESS NOTE   Subjective/Complaints: No new issues. Just waking up. No problems overnight  ROS: Patient denies fever, rash, sore throat, blurred vision, nausea, vomiting, diarrhea, cough, shortness of breath or chest pain, joint or back pain, headache, or mood change.    Objective:   No results found. No results for input(s): WBC, HGB, HCT, PLT in the last 72 hours. Recent Labs    04/29/19 0921  NA 141  K 3.9  CL 105  CO2 27  GLUCOSE 93  BUN 11  CREATININE 0.94  CALCIUM 9.2    Intake/Output Summary (Last 24 hours) at 05/01/2019 0829 Last data filed at 04/30/2019 1835 Gross per 24 hour  Intake 600 ml  Output --  Net 600 ml     Physical Exam: Vital Signs Blood pressure (!) 137/94, pulse 80, temperature 98.3 F (36.8 C), resp. rate 19, weight 79.2 kg, SpO2 100 %. Constitutional: No distress . Vital signs reviewed. HEENT: EOMI, oral membranes moist Neck: supple Cardiovascular: RRR without murmur. No JVD    Respiratory: CTA Bilaterally without wheezes or rales. Normal effort    GI: BS +, non-tender, non-distended   Skin: Warm and dry.  Intact. Psych: inconsistent cooperation Musc: No edema in extremities.  No tenderness in extremities. Neurologic: Alert, says hello. Follows basic commands, inconsistent. Limited insight Dysarthria Motor: Bilateral upper extremities: Grossly 4/5 proximal distal   Assessment/Plan: 1. Functional deficits secondary to covid encephalopathy which require 3+ hours per day of interdisciplinary therapy in a comprehensive inpatient rehab setting.  Physiatrist is providing close team supervision and 24 hour management of active medical problems listed below.  Physiatrist and rehab team continue to assess barriers to discharge/monitor patient progress toward functional and medical goals  Care Tool:  Bathing    Body parts bathed by patient: Chest, Abdomen, Face   Body parts bathed by helper:  Right arm, Left arm, Left upper leg, Right upper leg, Right lower leg, Left lower leg, Front perineal area, Buttocks     Bathing assist Assist Level: Maximal Assistance - Patient 24 - 49%     Upper Body Dressing/Undressing Upper body dressing   What is the patient wearing?: Hospital gown only    Upper body assist Assist Level: Maximal Assistance - Patient 25 - 49%    Lower Body Dressing/Undressing Lower body dressing    Lower body dressing activity did not occur: Refused What is the patient wearing?: Incontinence brief     Lower body assist Assist for lower body dressing: Maximal Assistance - Patient 25 - 49%     Toileting Toileting Toileting Activity did not occur Landscape architect and hygiene only): Refused  Toileting assist Assist for toileting: Dependent - Patient 0%     Transfers Chair/bed transfer  Transfers assist  Chair/bed transfer activity did not occur: Refused  Chair/bed transfer assist level: Maximal Assistance - Patient 25 - 49%     Locomotion Ambulation   Ambulation assist   Ambulation activity did not occur: Refused          Walk 10 feet activity   Assist  Walk 10 feet activity did not occur: Refused        Walk 50 feet activity   Assist Walk 50 feet with 2 turns activity did not occur: Refused         Walk 150 feet activity   Assist Walk 150 feet activity did not occur: Refused         Walk 10 feet  on uneven surface  activity   Assist Walk 10 feet on uneven surfaces activity did not occur: Armed forces operational officer activity did not occur: Refused         Wheelchair 50 feet with 2 turns activity    Assist    Wheelchair 50 feet with 2 turns activity did not occur: Refused       Wheelchair 150 feet activity     Assist  Wheelchair 150 feet activity did not occur: Refused       Blood pressure (!) 137/94, pulse 80, temperature 98.3 F (36.8 C), resp. rate  19, weight 79.2 kg, SpO2 100 %.  Medical Problem List and Plan: 1. Debility and encephalopathy secondary to Covid 19  Continue CIR  -SW reaching out to [facility] to see what level of care they are able to provide  2. Antithrombotics: -DVT/anticoagulation:Mechanical:Sequential compression devices, below kneeBilateral lower extremities.   -continue lovenox 40mg  daily -antiplatelet therapy: N/A 3. Pain Management:Was on Gabapentin 300 mg/hs PTA. Tylenol prn for now. 4. Mood:  consistent schedule/familiar faces to help with security/compliance  -suspect cognition is at baseline -antipsychotic agents: N/A 5. Neuropsych: This patientis not capable of making decisions on herown behalf.  6. Skin/Wound Care:Routine pressure relief measure.  -  eucerin cream for dry feet 7. Fluids/Electrolytes/Nutrition:encourage PO, monitor for oral pocketing.   Hypokalemia-potassium 3.9  -po intake poor, albumin only 2.8 (incr from last reading however( 8. Multifactorial encephalopathy post Covid: Catatonia has resolved.   - agitation better  -cognition likely close to baseline 9. ABLA: Has been transfused with 4 units PRBC.  Continue to monitor with serial checks.   Hemoglobin 10.9 on 1/14    10. Malnutrition: Intake limited to certain foods--offer supplements thorough out the day.   1/11 -added megace as appetite stimulant, haven't seen a lot of progress however  1/18 P.o. intake poor to inconsistent   -off megace as it may be contributing to loose stool, incontinence   1/20- intake appears to be picking up! 11. Multilevel Cervical stenosis: Follow up with Dr. Annette Stable after discharge.  12. H/o major depressive disorder: home paxil resumed   13. Prediabetes:   Relatively controlled on 1/20 14. Elevated LFT's: trending down 1/18 15. Persistent leukocytosis: 11/1 16.1  WBCs 14.2 on 1/14   Afebrile   Recheck Thursday 16. Dysphagia: D2 thin diet  -advance per SLP,      LOS: 10 days A FACE TO FACE EVALUATION WAS PERFORMED  [name] 05/01/2019, 8:29 AM

## 2019-05-02 ENCOUNTER — Inpatient Hospital Stay (HOSPITAL_COMMUNITY): Payer: Medicare Other | Admitting: Physical Therapy

## 2019-05-02 ENCOUNTER — Inpatient Hospital Stay (HOSPITAL_COMMUNITY): Payer: Medicare Other | Admitting: Speech Pathology

## 2019-05-02 ENCOUNTER — Inpatient Hospital Stay (HOSPITAL_COMMUNITY): Payer: Medicare Other

## 2019-05-02 LAB — URINALYSIS, COMPLETE (UACMP) WITH MICROSCOPIC
Bilirubin Urine: NEGATIVE
Glucose, UA: NEGATIVE mg/dL
Hgb urine dipstick: NEGATIVE
Ketones, ur: NEGATIVE mg/dL
Nitrite: POSITIVE — AB
Protein, ur: 30 mg/dL — AB
Specific Gravity, Urine: 1.018 (ref 1.005–1.030)
pH: 6 (ref 5.0–8.0)

## 2019-05-02 LAB — GLUCOSE, CAPILLARY
Glucose-Capillary: 96 mg/dL (ref 70–99)
Glucose-Capillary: 98 mg/dL (ref 70–99)
Glucose-Capillary: 100 mg/dL — ABNORMAL HIGH (ref 70–99)
Glucose-Capillary: 103 mg/dL — ABNORMAL HIGH (ref 70–99)

## 2019-05-02 LAB — CBC
HCT: 35.6 % — ABNORMAL LOW (ref 36.0–46.0)
Hemoglobin: 11.2 g/dL — ABNORMAL LOW (ref 12.0–15.0)
MCH: 28.1 pg (ref 26.0–34.0)
MCHC: 31.5 g/dL (ref 30.0–36.0)
MCV: 89.4 fL (ref 80.0–100.0)
Platelets: 455 10*3/uL — ABNORMAL HIGH (ref 150–400)
RBC: 3.98 MIL/uL (ref 3.87–5.11)
RDW: 17.2 % — ABNORMAL HIGH (ref 11.5–15.5)
WBC: 15.7 10*3/uL — ABNORMAL HIGH (ref 4.0–10.5)
nRBC: 0 % (ref 0.0–0.2)

## 2019-05-02 LAB — PREALBUMIN: Prealbumin: 21.9 mg/dL (ref 18–38)

## 2019-05-02 NOTE — Progress Notes (Signed)
Occupational Therapy Session Note  Patient Details  Name: Rebecca Bruce MRN: [id number] Date of Birth: [birth year]  Today's Date: 05/02/2019 OT Individual Time: 1100-1155 OT Individual Time Calculation (min): 55 min    Short Term Goals: Week 1:  OT Short Term Goal 1 (Week 1): Pt will don shirt with mod A OT Short Term Goal 1 - Progress (Week 1): Not met OT Short Term Goal 2 (Week 1): Pt will complete transfer to [facility] with mod A OT Short Term Goal 2 - Progress (Week 1): Not met OT Short Term Goal 3 (Week 1): Pt will feed herself a meal with min-mod cueing and min A overall OT Short Term Goal 3 - Progress (Week 1): Met  Skilled Therapeutic Interventions/Progress Updates:    1;1. Pt received in bed agreeable to bathing and dressing at EOB. Pt requires MIN A for EOB with OT holding pt hand for trunk elevation support. Pt completes UB bathing seated EOB with S and max VC for sequencing. Pt completes BL bathing with MIN A to maintain seated figure 4 position. Pt able to doff/don new socks with MIN A and MOD A for threading LEs into pants. Pt completes sit to stand at EOB with standard chair back facing pt to push up on and support under R arm for MOD A for lifting as +2 advances pants past hips. Pt with improved upright posture today standing 80% upright with VC for lifting head up. Pt requires significant amount of time to process/execute cues d/t decreased attention, memory and initiation. Exited session with pt seated in bed exit alarm on and call light in reach   Therapy Documentation Precautions:  Precautions Precautions: Fall Precaution Comments: intellectual disability at baseline Restrictions Weight Bearing Restrictions: No General:   Vital Signs:   Pain:   ADL:   Vision   Perception    Praxis   Exercises:   Other Treatments:     Therapy/Group: Individual Therapy  Tonny Branch 05/02/2019, 12:03 PM

## 2019-05-02 NOTE — Plan of Care (Signed)
  Problem: Consults Goal: RH GENERAL PATIENT EDUCATION Description: See Patient Education module for education specifics. Outcome: Progressing Goal: Skin Care Protocol Initiated - if Braden Score 18 or less Description: If consults are not indicated, leave blank or document N/A Outcome: Progressing Goal: Nutrition Consult-if indicated Outcome: Progressing Goal: Diabetes Guidelines if Diabetic/Glucose > 140 Description: If diabetic or lab glucose is > 140 mg/dl - Initiate Diabetes/Hyperglycemia Guidelines & Document Interventions  Outcome: Progressing   Problem: RH BOWEL ELIMINATION Goal: RH STG MANAGE BOWEL WITH ASSISTANCE Description: STG Manage Bowel with  modified Assistance. Outcome: Progressing Goal: RH STG MANAGE BOWEL W/MEDICATION W/ASSISTANCE Description: STG Manage Bowel with Medication with  modified Assistance. Outcome: Progressing   Problem: RH BLADDER ELIMINATION Goal: RH STG MANAGE BLADDER WITH ASSISTANCE Description: STG Manage Bladder With modified Assistance Outcome: Progressing Goal: RH STG MANAGE BLADDER WITH MEDICATION WITH ASSISTANCE Description: STG Manage Bladder With Medication With  modified Assistance. Outcome: Progressing Goal: RH STG MANAGE BLADDER WITH EQUIPMENT WITH ASSISTANCE Description: STG Manage Bladder With Equipment With modifiedAssistance Outcome: Progressing   Problem: RH SKIN INTEGRITY Goal: RH STG SKIN FREE OF INFECTION/BREAKDOWN Description: Skin free from infection entire stay on rehab Outcome: Progressing Goal: RH STG MAINTAIN SKIN INTEGRITY WITH ASSISTANCE Description: STG Maintain Skin Integrity With  max Assistance. Outcome: Progressing Goal: RH STG ABLE TO PERFORM INCISION/WOUND CARE W/ASSISTANCE Description: STG Able To Perform Incision/Wound Care With max Assistance. Outcome: Progressing   Problem: RH SAFETY Goal: RH STG ADHERE TO SAFETY PRECAUTIONS W/ASSISTANCE/DEVICE Description: STG Adhere to Safety Precautions With  modified Assistance/Device. Outcome: Progressing Goal: RH STG DECREASED RISK OF FALL WITH ASSISTANCE Description: STG Decreased Risk of Fall With modified  Assistance. Outcome: Progressing   Problem: RH PAIN MANAGEMENT Goal: RH STG PAIN MANAGED AT OR BELOW PT'S PAIN GOAL Outcome: Progressing   Problem: RH KNOWLEDGE DEFICIT GENERAL Goal: RH STG INCREASE KNOWLEDGE OF SELF CARE AFTER HOSPITALIZATION Description: Mod I Outcome: Progressing

## 2019-05-02 NOTE — Progress Notes (Signed)
[place] PHYSICAL MEDICINE & REHABILITATION PROGRESS NOTE   Subjective/Complaints: No new issues. Up in bed watching TV  ROS: Limited due to cognitive/behavioral    Objective:   No results found. Recent Labs    05/02/19 0622  WBC 15.7*  HGB 11.2*  HCT 35.6*  PLT 455*   No results for input(s): NA, K, CL, CO2, GLUCOSE, BUN, CREATININE, CALCIUM in the last 72 hours.  Intake/Output Summary (Last 24 hours) at 05/02/2019 0936 Last data filed at 05/02/2019 0751 Gross per 24 hour  Intake 320 ml  Output --  Net 320 ml     Physical Exam: Vital Signs Blood pressure 105/72, pulse 85, temperature 98.1 F (36.7 C), temperature source Oral, resp. rate 18, weight 79.2 kg, SpO2 100 %. Constitutional: No distress . Vital signs reviewed. HEENT: EOMI, oral membranes moist Neck: supple Cardiovascular: RRR without murmur. No JVD    Respiratory: CTA Bilaterally without wheezes or rales. Normal effort    GI: BS +, non-tender, non-distended  Skin: Warm and dry.  Intact. Psych: distracted but cooperates Musc: No edema in extremities.  No tenderness in extremities. Neurologic: Alert, says hello. Follows basic commands, inconsistent. Limited insight Dysarthria Motor: Bilateral upper extremities: Grossly 4/5 proximal distal--stable exam   Assessment/Plan: 1. Functional deficits secondary to covid encephalopathy which require 3+ hours per day of interdisciplinary therapy in a comprehensive inpatient rehab setting.  Physiatrist is providing close team supervision and 24 hour management of active medical problems listed below.  Physiatrist and rehab team continue to assess barriers to discharge/monitor patient progress toward functional and medical goals  Care Tool:  Bathing    Body parts bathed by patient: Chest, Abdomen, Face   Body parts bathed by helper: Right arm, Left arm, Left upper leg, Right upper leg, Right lower leg, Left lower leg, Front perineal area, Buttocks      Bathing assist Assist Level: Maximal Assistance - Patient 24 - 49%     Upper Body Dressing/Undressing Upper body dressing   What is the patient wearing?: Hospital gown only    Upper body assist Assist Level: Maximal Assistance - Patient 25 - 49%    Lower Body Dressing/Undressing Lower body dressing    Lower body dressing activity did not occur: Refused What is the patient wearing?: Incontinence brief     Lower body assist Assist for lower body dressing: Maximal Assistance - Patient 25 - 49%     Toileting Toileting Toileting Activity did not occur Landscape architect and hygiene only): Refused  Toileting assist Assist for toileting: Dependent - Patient 0%     Transfers Chair/bed transfer  Transfers assist  Chair/bed transfer activity did not occur: Refused  Chair/bed transfer assist level: Total Assistance - Patient < 25%(squat pivot to the bedside recliner)     Locomotion Ambulation   Ambulation assist   Ambulation activity did not occur: Refused          Walk 10 feet activity   Assist  Walk 10 feet activity did not occur: Refused        Walk 50 feet activity   Assist Walk 50 feet with 2 turns activity did not occur: Refused         Walk 150 feet activity   Assist Walk 150 feet activity did not occur: Refused         Walk 10 feet on uneven surface  activity   Assist Walk 10 feet on uneven surfaces activity did not occur: Refused  Programmer, multimedia activity did not occur: Refused         Wheelchair 50 feet with 2 turns activity    Assist    Wheelchair 50 feet with 2 turns activity did not occur: Refused       Wheelchair 150 feet activity     Assist  Wheelchair 150 feet activity did not occur: Refused       Blood pressure 105/72, pulse 85, temperature 98.1 F (36.7 C), temperature source Oral, resp. rate 18, weight 79.2 kg, SpO2 100 %.  Medical Problem List and  Plan: 1. Debility and encephalopathy secondary to Covid 19  Continue CIR  -SW reaching out to [facility] to see what level of care they are able to provide  2. Antithrombotics: -DVT/anticoagulation:Mechanical:Sequential compression devices, below kneeBilateral lower extremities.   -continue lovenox 40mg  daily -antiplatelet therapy: N/A 3. Pain Management:Was on Gabapentin 300 mg/hs PTA. Tylenol prn for now. 4. Mood:  consistent schedule/familiar faces to help with security/compliance  -suspect cognition is at baseline -antipsychotic agents: N/A 5. Neuropsych: This patientis not capable of making decisions on herown behalf.  6. Skin/Wound Care:Routine pressure relief measure.  -  eucerin cream for dry feet 7. Fluids/Electrolytes/Nutrition:encourage PO, monitor for oral pocketing.   Hypokalemia-potassium 3.9  -po intake poor, albumin only 2.8 (incr from last reading however( 8. Multifactorial encephalopathy post Covid: Catatonia has resolved.   - agitation better  -cognition likely close to baseline 9. ABLA: Has been transfused with 4 units PRBC.  Continue to monitor with serial checks.   Hemoglobin 10.9 on 1/14    10. Malnutrition: Intake limited to certain foods--offer supplements thorough out the day.   1/11 -added megace as appetite stimulant, haven't seen a lot of progress however  1/18 P.o. intake poor to inconsistent   -off megace as it may be contributing to loose stool, incontinence   1/21- intake overall improved although not optimal 11. Multilevel Cervical stenosis: Follow up with Dr. Annette Stable after discharge.  12. H/o major depressive disorder: home paxil resumed   13. Prediabetes:   Relatively controlled on 1/20 14. Elevated LFT's: trending down 1/18 15. Persistent leukocytosis: 11/1 16.1  WBCs 14.2 on 1/14   Afebrile   1/21 15.7----still no signs of infection, will go ahead and check UA, UCX today.  16. Dysphagia: D2 thin diet  -advance per  SLP,     LOS: 11 days A FACE TO FACE EVALUATION WAS PERFORMED  [name] 05/02/2019, 9:36 AM

## 2019-05-02 NOTE — Progress Notes (Signed)
Speech Language Pathology Daily Session Note  Patient Details  Name: Rebecca Bruce MRN: [id number] Date of Birth: 19-Apr-1960  Today's Date: 05/02/2019 SLP Individual Time: 1410-1440 SLP Individual Time Calculation (min): 30 min  Short Term Goals: Week 2: SLP Short Term Goal 1 (Week 2): Pt will demonstrate sustained attention for 2 minute intervals with max A verbal cues for redirection. SLP Short Term Goal 2 (Week 2): Pt will follow basic 1 step commands in functional tasks with 70% accuracy given max A verbal cues. SLP Short Term Goal 3 (Week 2): Patient will consume current diet with minimal overt s/s of aspiration with Min A verbal cues for use of swallowing compensatory strategies. SLP Short Term Goal 4 (Week 2): Patient will demonstrate efficient mastication and complete oral clearance with trials of Dys. 3 textures over 2 sessions with Min A verbal cues prior to upgrade.  Skilled Therapeutic Interventions:  Skilled treatment session focused on dysphagia. SLP attempted to help pt reposition in bed for optimal PO intake. This was difficult to attempt as pt had difficult comprehending that SLP was moving her up in bed and then pt became fixated on not having pillow or blankets touching her (however pt was resting her head on the pillow prior to attempts to reposition her). Pt was basically on her left side and semi-upright during consumption. Pt unable to answer or choose what type of drink she wanted but perseverated on questions about "who is going to take this up (touching the side rail). SLP provided skilled observation of pt consuming muffin with thin water via straw. When consuming muffin, pt demonstrated effective mastication and complete oral clearing (verified with visual checks). Pt consumed half of the muffin and ~ 3 oz of thin water. Pt was free of overt s/s of aspiration with water. Pt left in bed, bed alarm on and all needs within reach. Continue per current plan of care.     Pain    Therapy/Group: Individual Therapy  Rebecca Bruce 05/02/2019, 2:45 PM

## 2019-05-02 NOTE — Progress Notes (Signed)
Physical Therapy Session Note  Patient Details  Name: Rebecca Bruce MRN: [id number] Date of Birth: July 13, 1960  Today's Date: 05/02/2019 PT Individual Time: 1300-1345 PT Individual Time Calculation (min): 45 min   Short Term Goals: Week 2:  PT Short Term Goal 1 (Week 2): =LTGs due to ELOS  Skilled Therapeutic Interventions/Progress Updates:   Pt in supine and agreeable to therapy, no c/o pain. Pt requesting new pair of pants. Supine>sit w/ min assist, pt asking for assistance appropriately. Donned pants at EOB w/ max assist. Sit>stand w/ mod assist using back of chair in front of her for support, therapist pulled pants over hips w/ total assist once in standing. Needed only min-mod verbal and tactile cues for motor planning/seqeucning this sit<>stand. Verbal and tactile cues for upright posture, however pt would only reach ~75% full upright in stance. Pt sat up at EOB w/ supervision while eating lunch. She needed max encouragement at first to self-feed but was eventually able to utilize BUEs functionally to eat w/o physical assist. Verbal reminders to clear foot from oral cavity once finished.   Engaged pt in tossing ball from seated level to work on Adult nurse. Eventually asked pt to transfer to recliner in order to continue tossing ball, goal was to work on transfers but also to find something that would motivate pt to participate in mobility. Pt agreeable and did immediately begin to initiate transfer. Pt scooting to EOB w/ supervision and increased time. Provided max verbal/tactile cues for technique, sequencing, and motor planning. Pt did stand from EOB w/ chair in front of her again, mod assist to boost into standing, but then yelled out "no!" repeatedly when therapist attempted to facilitate pt pivoting to chair. Pt impulsively returned to sitting and refused any further activity. Pt did transfer sit>supine w/ min assist at end of session, refused any further activity despite max  encouragement. Ended session in supine, all needs in reach. Missed 30 min of skilled PT 2/2 refusal.    Therapy Documentation Precautions:  Precautions Precautions: Fall Precaution Comments: intellectual disability at baseline Restrictions Weight Bearing Restrictions: No Vital Signs: Therapy Vitals Temp: 98.3 F (36.8 C) Temp Source: Oral Pulse Rate: 77 Resp: 17 BP: 111/73 Patient Position (if appropriate): Lying Oxygen Therapy SpO2: 100 % O2 Device: Room Air  Therapy/Group: Individual Therapy  Lysandra Loughmiller Clent Demark 05/02/2019, 2:27 PM

## 2019-05-03 ENCOUNTER — Ambulatory Visit (HOSPITAL_COMMUNITY): Payer: Medicare Other | Admitting: Physical Therapy

## 2019-05-03 ENCOUNTER — Encounter (HOSPITAL_COMMUNITY): Payer: Medicare Other

## 2019-05-03 ENCOUNTER — Inpatient Hospital Stay (HOSPITAL_COMMUNITY): Payer: Medicare Other | Admitting: Speech Pathology

## 2019-05-03 LAB — GLUCOSE, CAPILLARY
Glucose-Capillary: 85 mg/dL (ref 70–99)
Glucose-Capillary: 87 mg/dL (ref 70–99)
Glucose-Capillary: 96 mg/dL (ref 70–99)
Glucose-Capillary: 92 mg/dL (ref 70–99)

## 2019-05-03 MED ORDER — POTASSIUM CHLORIDE 20 MEQ PO PACK
20.0000 meq | PACK | Freq: Two times a day (BID) | ORAL | Status: DC
  Administered 2019-05-03 – 2019-05-08 (×10): 20 meq via ORAL
  Filled 2019-05-03 (×12): qty 1

## 2019-05-03 MED ORDER — AMOXICILLIN 250 MG PO CAPS
250.0000 mg | ORAL_CAPSULE | Freq: Three times a day (TID) | ORAL | Status: DC
  Administered 2019-05-03 – 2019-05-04 (×4): 250 mg via ORAL
  Filled 2019-05-03 (×5): qty 1

## 2019-05-03 NOTE — Progress Notes (Signed)
Physical Therapy Session Note  Patient Details  Name: Rebecca Bruce MRN: [id number] Date of Birth: May 16, 1960  Today's Date: 05/03/2019 PT Individual Time: [id number] PT Individual Time Calculation (min): 53 min   Short Term Goals: Week 2:  PT Short Term Goal 1 (Week 2): =LTGs due to ELOS  Skilled Therapeutic Interventions/Progress Updates:   Pt received in handoff from OT, sitting in chair and agreeable to therapy, no c/o pain. Pt's nurse from group home Butch Penny) present to observe and participate in therapy session. Had very productive and helpful conversation regarding pt's PLOF and how to best interact w/ pt. Butch Penny assisted w/ multiple sit<>stands during session, appropriately guarded and assisted pt into standing w/ min assist. Therapist and Butch Penny provided max encouragement to pt and she was able to stand up to RW w/ BUEs on walker for ~30 sec. However, she refused any further standing and requested to stay sitting in recliner. Educated Butch Penny on pt's CLOF including supervision-min assist for bed mobility and up to max assist for transfers. Butch Penny reports this would not be a problem for herself and other staff members. She firmly believes pt will excel in her familiar (group home) environment w/ [place] therapies. Butch Penny w/ questions regarding pt's diet, provided w/ handout from SLP regarding current Dys 2 diet w/ thin liquids, which she stated the group home could accommodate. Discussed pt's potential to reach PLOF and importance of continuing to encourage her full participation in therapies after d/c. This therapist does believe that pt has ability and capacity to reach at least supervision level if she does receive and participate in follow-up care, Butch Penny in agreement w/ this plan. Answered all of [name]'s questions and made MD, SW, SLP, and OT aware of her willingness to bring pt back to [facility] group home at this level. Pt requesting to stay in recliner and watch TV. Ended session in recliner,  all needs in reach. Chair alarm activated and NT made aware of status.   Therapy Documentation Precautions:  Precautions Precautions: Fall Precaution Comments: intellectual disability at baseline Restrictions Weight Bearing Restrictions: No Vital Signs: Therapy Vitals Temp: 98.4 F (36.9 C) Pulse Rate: 93 Resp: 19 BP: (!) 143/92 Patient Position (if appropriate): Sitting Oxygen Therapy SpO2: 100 % O2 Device: Room Air  Therapy/Group: Individual Therapy  Iness Pangilinan Clent Demark 05/03/2019, 5:36 PM

## 2019-05-03 NOTE — Progress Notes (Signed)
Occupational Therapy Session Note  Patient Details  Name: Aminata Coons MRN: [id number] Date of Birth: 10-30-1960  Today's Date: 05/03/2019 OT Individual Time: 1345-1430 OT Individual Time Calculation (min): 45 min    Skilled Therapeutic Interventions/Progress Updates:    1;1. Pt received in bed agreeable to tx, but no pain. RN from group home late to session but shows up with last 15 min left. Pt supine>sitting with MIN HHA. Pt agreeable to getting into chair to wash up. Despite multiple attempts and different set ups pt unwilling to get into chair with A from OT. Butch Penny arrives and pt recognizes caregiver. Pt soiled with urines stands with HHA from Caregiver and turns to chair with VC from OT for hand placement. Pt sit to stand 2x with donna as OT cues for upright posture while cleansing peri area with BM and bladder void. Butch Penny confirms pt tactile defensive and resistant to unfamiliar medical staff. Direct handoff to PT.  Therapy Documentation Precautions:  Precautions Precautions: Fall Precaution Comments: intellectual disability at baseline Restrictions Weight Bearing Restrictions: No General:   Vital Signs:   Pain:   ADL:   Vision   Perception    Praxis   Exercises:   Other Treatments:     Therapy/Group: Individual Therapy  Tonny Branch 05/03/2019, 4:27 PM

## 2019-05-03 NOTE — Progress Notes (Signed)
[place] PHYSICAL MEDICINE & REHABILITATION PROGRESS NOTE   Subjective/Complaints: No new issues. Up in bed watching TV  ROS: Limited due to cognitive/behavioral    Objective:   No results found. Recent Labs    05/02/19 0622  WBC 15.7*  HGB 11.2*  HCT 35.6*  PLT 455*   No results for input(s): NA, K, CL, CO2, GLUCOSE, BUN, CREATININE, CALCIUM in the last 72 hours.  Intake/Output Summary (Last 24 hours) at 05/03/2019 1000 Last data filed at 05/03/2019 0754 Gross per 24 hour  Intake 360 ml  Output 650 ml  Net -290 ml     Physical Exam: Vital Signs Blood pressure 112/72, pulse 93, temperature 98.9 F (37.2 C), temperature source Oral, resp. rate 18, weight 79.2 kg, SpO2 100 %. Constitutional: No distress . Vital signs reviewed. HEENT: EOMI, oral membranes moist Neck: supple Cardiovascular: RRR without murmur. No JVD    Respiratory: CTA Bilaterally without wheezes or rales. Normal effort    GI: BS +, non-tender, non-distended  Skin: Warm and dry.  Intact. Psych: distracted but cooperates Musc: No edema in extremities.  No tenderness in extremities. Neurologic: Alert, says hello. Follows basic commands, inconsistent. Limited insight Dysarthria Motor: Bilateral upper extremities: Grossly 4/5 proximal distal--stable exam   Assessment/Plan: 1. Functional deficits secondary to covid encephalopathy which require 3+ hours per day of interdisciplinary therapy in a comprehensive inpatient rehab setting.  Physiatrist is providing close team supervision and 24 hour management of active medical problems listed below.  Physiatrist and rehab team continue to assess barriers to discharge/monitor patient progress toward functional and medical goals  Care Tool:  Bathing    Body parts bathed by patient: Chest, Abdomen, Face   Body parts bathed by helper: Right arm, Left arm, Left upper leg, Right upper leg, Right lower leg, Left lower leg, Front perineal area, Buttocks      Bathing assist Assist Level: Maximal Assistance - Patient 24 - 49%     Upper Body Dressing/Undressing Upper body dressing   What is the patient wearing?: Hospital gown only    Upper body assist Assist Level: Maximal Assistance - Patient 25 - 49%    Lower Body Dressing/Undressing Lower body dressing    Lower body dressing activity did not occur: Refused What is the patient wearing?: Incontinence brief     Lower body assist Assist for lower body dressing: Maximal Assistance - Patient 25 - 49%     Toileting Toileting Toileting Activity did not occur Landscape architect and hygiene only): Refused  Toileting assist Assist for toileting: Dependent - Patient 0%     Transfers Chair/bed transfer  Transfers assist  Chair/bed transfer activity did not occur: Refused  Chair/bed transfer assist level: Total Assistance - Patient < 25%(squat pivot to the bedside recliner)     Locomotion Ambulation   Ambulation assist   Ambulation activity did not occur: Refused          Walk 10 feet activity   Assist  Walk 10 feet activity did not occur: Refused        Walk 50 feet activity   Assist Walk 50 feet with 2 turns activity did not occur: Refused         Walk 150 feet activity   Assist Walk 150 feet activity did not occur: Refused         Walk 10 feet on uneven surface  activity   Assist Walk 10 feet on uneven surfaces activity did not occur: Refused  Programmer, multimedia activity did not occur: Refused         Wheelchair 50 feet with 2 turns activity    Assist    Wheelchair 50 feet with 2 turns activity did not occur: Refused       Wheelchair 150 feet activity     Assist  Wheelchair 150 feet activity did not occur: Refused       Blood pressure 112/72, pulse 93, temperature 98.9 F (37.2 C), temperature source Oral, resp. rate 18, weight 79.2 kg, SpO2 100 %.  Medical Problem List and  Plan: 1. Debility and encephalopathy secondary to Covid 19  Continue CIR as possible  -return to [facility]?  2. Antithrombotics: -DVT/anticoagulation:Mechanical:Sequential compression devices, below kneeBilateral lower extremities.   -continue lovenox 40mg  daily -antiplatelet therapy: N/A 3. Pain Management:Was on Gabapentin 300 mg/hs PTA. Tylenol prn for now. 4. Mood:  consistent schedule/familiar faces to help with security/compliance  -suspect cognition is at baseline -antipsychotic agents: N/A 5. Neuropsych: This patientis not capable of making decisions on herown behalf.  6. Skin/Wound Care:Routine pressure relief measure.  -  eucerin cream for dry feet 7. Fluids/Electrolytes/Nutrition:encourage PO, monitor for oral pocketing.   Hypokalemia-potassium 3.9  -po intake poor, albumin only 2.8 (incr from last reading however( 8. Multifactorial encephalopathy post Covid: Catatonia has resolved.   - agitation better  -cognition likely close to baseline 9. ABLA: Has been transfused with 4 units PRBC.  Continue to monitor with serial checks.   Hemoglobin 11.2 on 1/21    10. Malnutrition: Intake limited to certain foods--offer supplements thorough out the day.   1/11 -added megace as appetite stimulant, haven't seen a lot of progress however  1/18 P.o. intake poor to inconsistent   -off megace as it may be contributing to loose stool, incontinence   1/22 intake better overall 11. Multilevel Cervical stenosis: Follow up with Dr. Annette Stable after discharge.  12. H/o major depressive disorder: home paxil resumed   13. Prediabetes:   Relatively controlled on 1/20 14. Elevated LFT's: trending down 1/18 15. Persistent leukocytosis: 11/1 16.1  WBCs 14.2 on 1/14   Afebrile   1/21 15.7   1/22 UA +, begin empirix amoxil 250mg  bid  16. Dysphagia: D2 thin diet  -advance per SLP,     LOS: 12 days A FACE TO FACE EVALUATION WAS PERFORMED  [name] 05/03/2019, 10:00 AM

## 2019-05-03 NOTE — Progress Notes (Signed)
Speech Language Pathology Daily Session Note  Patient Details  Name: Rebecca Bruce MRN: [id number] Date of Birth: 1960/12/17  Today's Date: 05/03/2019 SLP Individual Time: 0915-1010 SLP Individual Time Calculation (min): 55 min  Short Term Goals: Week 2: SLP Short Term Goal 1 (Week 2): Pt will demonstrate sustained attention for 2 minute intervals with max A verbal cues for redirection. SLP Short Term Goal 2 (Week 2): Pt will follow basic 1 step commands in functional tasks with 70% accuracy given max A verbal cues. SLP Short Term Goal 3 (Week 2): Patient will consume current diet with minimal overt s/s of aspiration with Min A verbal cues for use of swallowing compensatory strategies. SLP Short Term Goal 4 (Week 2): Patient will demonstrate efficient mastication and complete oral clearance with trials of Dys. 3 textures over 2 sessions with Min A verbal cues prior to upgrade.  Skilled Therapeutic Interventions: Skilled treatment session focused on cognitive and dysphagia goals. SLP facilitated session by providing extra time and overall Mod-Max A verbal and tactile cues for problem solving and initiation for patient to scoot EOB and come into standing for donning her pants. Although patient required Max cueing, she only required Mod A verbal cues for attention to task. Patient consumed thin liquids via straw without overt s/s of aspiration but declined trials of textures despite Max encouragement. Patient left upright in bed with alarm on and all needs within reach. Continue with current plan of care.      Pain Pain Assessment Pain Scale: 0-10 Pain Score: 0-No pain Faces Pain Scale: No hurt  Therapy/Group: Individual Therapy  Claudette Wermuth 05/03/2019, 12:33 PM

## 2019-05-03 NOTE — Plan of Care (Signed)
  Problem: Consults Goal: RH GENERAL PATIENT EDUCATION Description: See Patient Education module for education specifics. Outcome: Progressing Goal: Skin Care Protocol Initiated - if Braden Score 18 or less Description: If consults are not indicated, leave blank or document N/A Outcome: Progressing Goal: Nutrition Consult-if indicated Outcome: Progressing Goal: Diabetes Guidelines if Diabetic/Glucose > 140 Description: If diabetic or lab glucose is > 140 mg/dl - Initiate Diabetes/Hyperglycemia Guidelines & Document Interventions  Outcome: Progressing   Problem: RH BOWEL ELIMINATION Goal: RH STG MANAGE BOWEL WITH ASSISTANCE Description: STG Manage Bowel with  modified Assistance. Outcome: Progressing Goal: RH STG MANAGE BOWEL W/MEDICATION W/ASSISTANCE Description: STG Manage Bowel with Medication with  modified Assistance. Outcome: Progressing   Problem: RH BLADDER ELIMINATION Goal: RH STG MANAGE BLADDER WITH ASSISTANCE Description: STG Manage Bladder With modified Assistance Outcome: Progressing Goal: RH STG MANAGE BLADDER WITH MEDICATION WITH ASSISTANCE Description: STG Manage Bladder With Medication With  modified Assistance. Outcome: Progressing Goal: RH STG MANAGE BLADDER WITH EQUIPMENT WITH ASSISTANCE Description: STG Manage Bladder With Equipment With modifiedAssistance Outcome: Progressing   Problem: RH SKIN INTEGRITY Goal: RH STG SKIN FREE OF INFECTION/BREAKDOWN Description: Skin free from infection entire stay on rehab Outcome: Progressing Goal: RH STG MAINTAIN SKIN INTEGRITY WITH ASSISTANCE Description: STG Maintain Skin Integrity With  max Assistance. Outcome: Progressing Goal: RH STG ABLE TO PERFORM INCISION/WOUND CARE W/ASSISTANCE Description: STG Able To Perform Incision/Wound Care With max Assistance. Outcome: Progressing   Problem: RH SAFETY Goal: RH STG ADHERE TO SAFETY PRECAUTIONS W/ASSISTANCE/DEVICE Description: STG Adhere to Safety Precautions With  modified Assistance/Device. Outcome: Progressing Goal: RH STG DECREASED RISK OF FALL WITH ASSISTANCE Description: STG Decreased Risk of Fall With modified  Assistance. Outcome: Progressing   Problem: RH PAIN MANAGEMENT Goal: RH STG PAIN MANAGED AT OR BELOW PT'S PAIN GOAL Outcome: Progressing   Problem: RH KNOWLEDGE DEFICIT GENERAL Goal: RH STG INCREASE KNOWLEDGE OF SELF CARE AFTER HOSPITALIZATION Description: Mod I Outcome: Progressing

## 2019-05-04 ENCOUNTER — Inpatient Hospital Stay (HOSPITAL_COMMUNITY): Payer: Medicare Other

## 2019-05-04 LAB — URINE CULTURE: Culture: >=100000 — AB

## 2019-05-04 LAB — GLUCOSE, CAPILLARY
Glucose-Capillary: 81 mg/dL (ref 70–99)
Glucose-Capillary: 84 mg/dL (ref 70–99)
Glucose-Capillary: 91 mg/dL (ref 70–99)
Glucose-Capillary: 93 mg/dL (ref 70–99)

## 2019-05-04 MED ORDER — SULFAMETHOXAZOLE-TRIMETHOPRIM 800-160 MG PO TABS
1.0000 | ORAL_TABLET | Freq: Two times a day (BID) | ORAL | Status: DC
  Administered 2019-05-04 – 2019-05-08 (×8): 1 via ORAL
  Filled 2019-05-04 (×9): qty 1

## 2019-05-04 NOTE — Progress Notes (Signed)
Social Work Patient ID: Rebecca Bruce, female   DOB: 06-09-1960, 59 y.o.   MRN: [id number]   Able to review team conference info with D.O.N. of pt's group home, Gena Fray.  She is aware of current assistance levels and minimal progress.  Have planned for her to be here tomorrow afternoon to see, first hand, the level of care and to determine if this can be managed at group home.  Tx team aware.  Lynnleigh Soden, LCSW

## 2019-05-04 NOTE — Plan of Care (Signed)
  Problem: RH BOWEL ELIMINATION Goal: RH STG MANAGE BOWEL WITH ASSISTANCE Description: STG Manage Bowel with  modified Assistance. Outcome: Not Progressing; incontinence   Problem: RH BLADDER ELIMINATION Goal: RH STG MANAGE BLADDER WITH ASSISTANCE Description: STG Manage Bladder With modified Assistance Outcome: Not Progressing; incontinence

## 2019-05-04 NOTE — Progress Notes (Signed)
Occupational Therapy Session Note  Patient Details  Name: Rebecca Bruce MRN: [id number] Date of Birth: May 11, 1960  Today's Date: 05/04/2019 OT Individual Time: 1100-1153 OT Individual Time Calculation (min): 53 min   Skilled Therapeutic Interventions/Progress Updates:    1:1. Pt received in bed agreeable to OT. Pt able to supine>sitting with bed rails and requires VC for reciprocal scooting to EOB. OT applies pants to feet and pt reports, "I want to do it my self [stand]." Attempted chair in front of patient, walker and HHA for pt to sit to stand, however pt would not allow OT to assist pt for power up and unable to create forward flexion/momentum to stand. Pt lies back down and rolls in B directions after reporting bladder incontinence for peri care an dbrief change. Exited session with pt sated inbed, exit alarm on and call light  In reach  Therapy Documentation Precautions:  Precautions Precautions: Fall Precaution Comments: intellectual disability at baseline Restrictions Weight Bearing Restrictions: No General:   Vital Signs:   Pain:   ADL:   Vision   Perception    Praxis   Exercises:   Other Treatments:     Therapy/Group: Individual Therapy  Tonny Branch 05/04/2019, 11:59 AM

## 2019-05-05 ENCOUNTER — Inpatient Hospital Stay (HOSPITAL_COMMUNITY): Payer: Medicare Other | Admitting: Speech Pathology

## 2019-05-05 LAB — GLUCOSE, CAPILLARY
Glucose-Capillary: 87 mg/dL (ref 70–99)
Glucose-Capillary: 106 mg/dL — ABNORMAL HIGH (ref 70–99)
Glucose-Capillary: 98 mg/dL (ref 70–99)
Glucose-Capillary: 88 mg/dL (ref 70–99)

## 2019-05-05 NOTE — Plan of Care (Signed)
  Problem: RH BOWEL ELIMINATION Goal: RH STG MANAGE BOWEL WITH ASSISTANCE Description: STG Manage Bowel with  modified Assistance. Outcome: Not Progressing; incontinence   Problem: RH BLADDER ELIMINATION Goal: RH STG MANAGE BLADDER WITH ASSISTANCE Description: STG Manage Bladder With modified Assistance Outcome: Not Progressing; incontinence

## 2019-05-05 NOTE — Progress Notes (Signed)
[name] PHYSICAL MEDICINE & REHABILITATION PROGRESS NOTE   Subjective/Complaints: Up in bed watching cartoons. No problems  ROS: Limited due to cognitive/behavioral   Objective:   No results found. No results for input(s): WBC, HGB, HCT, PLT in the last 72 hours. No results for input(s): NA, K, CL, CO2, GLUCOSE, BUN, CREATININE, CALCIUM in the last 72 hours.  Intake/Output Summary (Last 24 hours) at 05/05/2019 0947 Last data filed at 05/05/2019 0910 Gross per 24 hour  Intake 350 ml  Output --  Net 350 ml     Physical Exam: Vital Signs Blood pressure 127/87, pulse 89, temperature 98.3 F (36.8 C), resp. rate 18, weight 78.3 kg, SpO2 100 %. Constitutional: No distress . Vital signs reviewed. HEENT: EOMI, oral membranes moist Neck: supple Cardiovascular: RRR without murmur. No JVD    Respiratory: CTA Bilaterally without wheezes or rales. Normal effort    GI: BS +, non-tender, non-distended   Skin: Warm and dry.  Intact. Psych: distracted but cooperates Musc: No edema in extremities.  No tenderness in extremities. Neurologic: Alert, says hello. Follows basic commands, inconsistent. Limited insight Dysarthria Motor: Bilateral upper extremities: Grossly 4/5 proximal distal--stable exam   Assessment/Plan: 1. Functional deficits secondary to covid encephalopathy which require 3+ hours per day of interdisciplinary therapy in a comprehensive inpatient rehab setting.  Physiatrist is providing close team supervision and 24 hour management of active medical problems listed below.  Physiatrist and rehab team continue to assess barriers to discharge/monitor patient progress toward functional and medical goals  Care Tool:  Bathing    Body parts bathed by patient: Chest, Abdomen, Face   Body parts bathed by helper: Right arm, Left arm, Left upper leg, Right upper leg, Right lower leg, Left lower leg, Front perineal area, Buttocks     Bathing assist Assist Level: Maximal  Assistance - Patient 24 - 49%     Upper Body Dressing/Undressing Upper body dressing   What is the patient wearing?: Hospital gown only    Upper body assist Assist Level: Maximal Assistance - Patient 25 - 49%    Lower Body Dressing/Undressing Lower body dressing    Lower body dressing activity did not occur: Refused What is the patient wearing?: Incontinence brief     Lower body assist Assist for lower body dressing: Maximal Assistance - Patient 25 - 49%     Toileting Toileting Toileting Activity did not occur Landscape architect and hygiene only): Refused  Toileting assist Assist for toileting: Dependent - Patient 0%     Transfers Chair/bed transfer  Transfers assist  Chair/bed transfer activity did not occur: Refused  Chair/bed transfer assist level: Total Assistance - Patient < 25%(squat pivot to the bedside recliner)     Locomotion Ambulation   Ambulation assist   Ambulation activity did not occur: Refused          Walk 10 feet activity   Assist  Walk 10 feet activity did not occur: Refused        Walk 50 feet activity   Assist Walk 50 feet with 2 turns activity did not occur: Refused         Walk 150 feet activity   Assist Walk 150 feet activity did not occur: Refused         Walk 10 feet on uneven surface  activity   Assist Walk 10 feet on uneven surfaces activity did not occur: Sales promotion account executive  Wheelchair activity did not occur: Refused         Wheelchair 50 feet with 2 turns activity    Assist    Wheelchair 50 feet with 2 turns activity did not occur: Refused       Wheelchair 150 feet activity     Assist  Wheelchair 150 feet activity did not occur: Refused       Blood pressure 127/87, pulse 89, temperature 98.3 F (36.8 C), resp. rate 18, weight 78.3 kg, SpO2 100 %.  Medical Problem List and Plan: 1. Debility and encephalopathy secondary to Covid 19  Continue  CIR as possible  -[facility] to [facility] this week. SW to follow up. Rebecca Bruce apparently able to provide her needed care  2. Antithrombotics: -DVT/anticoagulation:Mechanical:Sequential compression devices, below kneeBilateral lower extremities.   -continue lovenox 40mg  daily -antiplatelet therapy: N/A 3. Pain Management:Was on Gabapentin 300 mg/hs PTA. Tylenol prn for now. 4. Mood:  consistent schedule/familiar faces to help with security/compliance  -suspect cognition is at baseline -antipsychotic agents: N/A 5. Neuropsych: This patientis not capable of making decisions on herown behalf.  6. Skin/Wound Care:Routine pressure relief measure.  -  eucerin cream for dry feet 7. Fluids/Electrolytes/Nutrition:encourage PO, monitor for oral pocketing.   Hypokalemia-potassium 3.9   8. Multifactorial encephalopathy post Covid: Catatonia has resolved.   - agitation better  -cognition likely close to baseline 9. ABLA: Has been transfused with 4 units PRBC.  Continue to monitor with serial checks.   Hemoglobin 11.2 on 1/21    10. Malnutrition: Intake limited to certain foods--offer supplements thorough out the day.   1/11 -added megace as appetite stimulant, haven't seen a lot of progress however  1/18 P.o. intake poor to inconsistent   -off megace as it may be contributing to loose stool, incontinence   1/24 intake inconsistent but better, prealbumin from 1/21 was 21.9 11. Multilevel Cervical stenosis: Follow up with Dr. Annette Stable after discharge.  12. H/o major depressive disorder: home paxil resumed   13. Prediabetes:   Relatively controlled on 1/20 14. Elevated LFT's: trending down 1/18 15. Persistent leukocytosis: 11/1 16.1  WBCs 14.2 on 1/14   Afebrile   1/21 15.7   1/24 100K Citrobacter in urine:  abx changed to bactrim yesterday---continue for seven day course 16. Dysphagia: D2 thin diet  -advance per SLP,     LOS: 14 days A FACE TO FACE EVALUATION WAS  PERFORMED  [name] 05/05/2019, 9:47 AM

## 2019-05-05 NOTE — Progress Notes (Signed)
SLP Cancellation Note  Patient Details Name: Rebecca Bruce MRN: [id number] DOB: 16-Sep-1960   Cancelled treatment:        Attempted to see pt for scheduled therapy session; however, upon therapist's arrival pt stated repeatedly "I ain't doing it."  Therapist gently attempted to encourage pt to participate in therapy and while pt appeared less resistant to/agitated by therapist's presence, she still indicated that she did not want to participate in therapy, even when offered choices of activities.  As a result, pt missed 45 min of skilled ST. Will continue efforts as pt is willing to participate in treatment                                                                                                Emilio Math 05/05/2019, 2:24 PM

## 2019-05-06 ENCOUNTER — Inpatient Hospital Stay (HOSPITAL_COMMUNITY): Payer: Medicare Other | Admitting: Physical Therapy

## 2019-05-06 ENCOUNTER — Inpatient Hospital Stay (HOSPITAL_COMMUNITY): Payer: Medicare Other

## 2019-05-06 ENCOUNTER — Inpatient Hospital Stay (HOSPITAL_COMMUNITY): Payer: Medicare Other | Admitting: Speech Pathology

## 2019-05-06 LAB — GLUCOSE, CAPILLARY
Glucose-Capillary: 112 mg/dL — ABNORMAL HIGH (ref 70–99)
Glucose-Capillary: 86 mg/dL (ref 70–99)
Glucose-Capillary: 79 mg/dL (ref 70–99)
Glucose-Capillary: 112 mg/dL — ABNORMAL HIGH (ref 70–99)

## 2019-05-06 MED ORDER — GABAPENTIN 300 MG PO CAPS: 300.0000 mg | ORAL_CAPSULE | Freq: Every day | ORAL | Status: DC

## 2019-05-06 NOTE — Progress Notes (Signed)
Physical Therapy Weekly Progress Note  Patient Details  Name: Rebecca Bruce MRN: [id number] Date of Birth: 05-03-1960  Beginning of progress report period: April 29, 2019 End of progress report period: May 06, 2019  Today's Date: 05/06/2019 PT Individual Time: 1400-1445 PT Individual Time Calculation (min): 45 min   Patient has met 1 of 3 long term goals. Pt has demonstrated improved participation over last week w/ improved trust in therapy staff to assist her, increased global strength and endurance, and improved motor planning/sequencing. However, she continues to require mod assist sit>stand and mod-max squat/stand pivot transfers. If often takes the majority of session to perform those tasks as well. She refuses attempts at gait.   Patient continues to demonstrate the following deficits muscle weakness and muscle joint tightness, decreased cardiorespiratoy endurance, decreased initiation, decreased attention, decreased awareness, decreased problem solving, decreased safety awareness, decreased memory and delayed processing and decreased standing balance and decreased balance strategies and therefore will continue to benefit from skilled PT intervention to increase functional independence with mobility.  Patient progressing toward long term goals..  Continue plan of care.  Pt downgraded to QD 2/2 poor participation in general.   Nurse from pt's group home has been present to observe pt's level of participation and function in therapies. She agrees that pt would greatly benefit from being in her familiar environment w/ staff members she has been with for many years. Discussed pt's CLOF and likelihood of returning to that. I go believe pt has the capacity and ability to reach supervision level, however it would require full participation in therapies upon d/c. Pt's nurse verbalized understanding and in agreement. She is agreeable to bring pt back to group home at this current level of  care.   Will add sit<>stand goal and continue to work towards min assist transfers in anticipation of d/c back to group home. Supervision bed mobility goal met.   PT Short Term Goals Week 2:  PT Short Term Goal 1 (Week 2): =LTGs due to ELOS Week 3:  PT Short Term Goal 1 (Week 3): =LTGs due to ELOS  Skilled Therapeutic Interventions/Progress Updates:   Pt in supine and agreeable to therapy, no c/o pain. Pt requesting to wash up. Performed supine>sit w/ significantly extra time but performed w/ supervision and verbal/visual cues for technique. Once seated EOB, pt able to verbalize that she was trying to stand to transfer to recliner, however unable to successfully motor plan and sequence task. Pt refused any physical help from therapist after multiple offers of assistance. Max verbal/visual cues for technique. Pt continued to be internally distracted and distracted by sounds in hallway, breaking up her motor planning. Attempted for 30+ min w/o success. Dependent sit>supine for safety. Ended session in supine, all needs in reach and watching TV. Missed 15 min of skilled PT 2/2 unwilling to participate.   Therapy Documentation Precautions:  Precautions Precautions: Fall Precaution Comments: intellectual disability at baseline Restrictions Weight Bearing Restrictions: No  Therapy/Group: Individual Therapy  Hektor Huston K Saamir Armstrong 05/06/2019, 3:00 PM

## 2019-05-06 NOTE — Plan of Care (Signed)
  Problem: RH Bed Mobility Goal: LTG Patient will perform bed mobility with assist (PT) Description: LTG: Patient will perform bed mobility with assistance, with/without cues (PT). Outcome: Completed/Met   Problem: RH Car Transfers Goal: LTG Patient will perform car transfers with assist (PT) Description: LTG: Patient will perform car transfers with assistance (PT). Outcome: Not Applicable Flowsheets (Taken 05/06/2019 [id number]) LTG: Pt will perform car transfers with assist:: (goal discontinued 1/25 as pt w/ plan to d/c home in w/c van - AAT) -- Note: goal discontinued 1/25 as pt w/ plan to d/c home in w/c van - AAT   Problem: Sit to Stand Goal: LTG:  Patient will perform sit to stand with assistance level (PT) Description: LTG:  Patient will perform sit to stand with assistance level (PT) Flowsheets (Taken 05/06/2019 0940) LTG: PT will perform sit to stand in preparation for functional mobility with assistance level: Minimal Assistance - Patient > 75%

## 2019-05-06 NOTE — Progress Notes (Signed)
[facility] PHYSICAL MEDICINE & REHABILITATION PROGRESS NOTE   Subjective/Complaints: No complaints this morning.  Denies pain or constipation. Says she is sleeping well at night.   ROS: Limited due to cognitive/behavioral   Objective:   No results found. No results for input(s): WBC, HGB, HCT, PLT in the last 72 hours. No results for input(s): NA, K, CL, CO2, GLUCOSE, BUN, CREATININE, CALCIUM in the last 72 hours.  Intake/Output Summary (Last 24 hours) at 05/06/2019 0930 Last data filed at 05/06/2019 0700 Gross per 24 hour  Intake 500 ml  Output 1000 ml  Net -500 ml     Physical Exam: Vital Signs Blood pressure 128/80, pulse 82, temperature 98.9 F (37.2 C), resp. rate 16, weight 78.9 kg, SpO2 100 %. Constitutional: No distress . Vital signs reviewed. HEENT: EOMI, oral membranes moist Neck: supple Cardiovascular: RRR without murmur. No JVD    Respiratory: CTA Bilaterally without wheezes or rales. Normal effort    GI: BS +, non-tender, non-distended   Skin: Warm and dry.  Intact. Psych: distracted but cooperates Musc: No edema in extremities.  No tenderness in extremities. Neurologic: Alert, says hello. Follows basic commands, inconsistent. Limited insight Dysarthria Motor: Bilateral upper extremities: Grossly 4/5 proximal distal--stable exam   Assessment/Plan: 1. Functional deficits secondary to covid encephalopathy which require 3+ hours per day of interdisciplinary therapy in a comprehensive inpatient rehab setting.  Physiatrist is providing close team supervision and 24 hour management of active medical problems listed below.  Physiatrist and rehab team continue to assess barriers to discharge/monitor patient progress toward functional and medical goals  Care Tool:  Bathing    Body parts bathed by patient: Chest, Abdomen, Face   Body parts bathed by helper: Right arm, Left arm, Left upper leg, Right upper leg, Right lower leg, Left lower leg, Front perineal  area, Buttocks     Bathing assist Assist Level: Maximal Assistance - Patient 24 - 49%     Upper Body Dressing/Undressing Upper body dressing   What is the patient wearing?: Hospital gown only    Upper body assist Assist Level: Maximal Assistance - Patient 25 - 49%    Lower Body Dressing/Undressing Lower body dressing    Lower body dressing activity did not occur: Refused What is the patient wearing?: Incontinence brief     Lower body assist Assist for lower body dressing: Maximal Assistance - Patient 25 - 49%     Toileting Toileting Toileting Activity did not occur Landscape architect and hygiene only): Refused  Toileting assist Assist for toileting: Dependent - Patient 0%     Transfers Chair/bed transfer  Transfers assist  Chair/bed transfer activity did not occur: Refused  Chair/bed transfer assist level: Total Assistance - Patient < 25%(squat pivot to the bedside recliner)     Locomotion Ambulation   Ambulation assist   Ambulation activity did not occur: Refused          Walk 10 feet activity   Assist  Walk 10 feet activity did not occur: Refused        Walk 50 feet activity   Assist Walk 50 feet with 2 turns activity did not occur: Refused         Walk 150 feet activity   Assist Walk 150 feet activity did not occur: Refused         Walk 10 feet on uneven surface  activity   Assist Walk 10 feet on uneven surfaces activity did not occur: Refused  Programmer, multimedia activity did not occur: Refused         Wheelchair 50 feet with 2 turns activity    Assist    Wheelchair 50 feet with 2 turns activity did not occur: Refused       Wheelchair 150 feet activity     Assist  Wheelchair 150 feet activity did not occur: Refused       Blood pressure 128/80, pulse 82, temperature 98.9 F (37.2 C), resp. rate 16, weight 78.9 kg, SpO2 100 %.  Medical Problem List and Plan: 1.  Debility and encephalopathy secondary to Covid 19  Continue CIR as possible  -return to [facility] this week. SW to follow up. [facility] apparently able to provide her needed care  2. Antithrombotics: -DVT/anticoagulation:Mechanical:Sequential compression devices, below kneeBilateral lower extremities.   -continue lovenox 40mg  daily -antiplatelet therapy: N/A 3. Pain Management:Was on Gabapentin 300 mg/hs PTA. Tylenol prn for now. 4. Mood:  consistent schedule/familiar faces to help with security/compliance  -suspect cognition is at baseline -antipsychotic agents: N/A 5. Neuropsych: This patientis not capable of making decisions on herown behalf.  6. Skin/Wound Care:Routine pressure relief measure.  -  eucerin cream for dry feet 7. Fluids/Electrolytes/Nutrition:encourage PO, monitor for oral pocketing.   Hypokalemia-potassium 3.9 8. Multifactorial encephalopathy post Covid: Catatonia has resolved.   - agitation better  -cognition likely close to baseline 9. ABLA: Has been transfused with 4 units PRBC.  Continue to monitor with serial checks.   Hemoglobin 11.2 on 1/21    10. Malnutrition: Intake limited to certain foods--offer supplements thorough out the day.   1/11 -added megace as appetite stimulant, haven't seen a lot of progress however  1/18 P.o. intake poor to inconsistent   -off megace as it may be contributing to loose stool, incontinence   1/24 intake inconsistent but better, prealbumin from 1/21 was 21.9 11. Multilevel Cervical stenosis: Follow up with Dr. Annette Stable after discharge.  12. H/o major depressive disorder: home paxil resumed   13. Prediabetes:   Relatively controlled on 1/20, 1/25 14. Elevated LFT's: trending down 1/18 15. Persistent leukocytosis: 11/1 16.1  WBCs 14.2 on 1/14   Afebrile   1/21 15.7   1/24 100K Citrobacter in urine:  abx changed to bactrim yesterday---continue for seven day course 16. Dysphagia: D2 thin diet  -advance per  SLP,     LOS: 15 days A FACE TO FACE EVALUATION WAS PERFORMED  Demetrios Byron P Yaw Escoto 05/06/2019, 9:30 AM

## 2019-05-06 NOTE — Progress Notes (Signed)
  Patient ID: Rebecca Bruce, female   DOB: 26-May-1960, 59 y.o.   MRN: [id number]   Diagnosis codes:  R53.81; U07.1;  J96.01  Height:  5'2"  Weight:   174 lbs    Patient suffers from debility following COVID-19 with respiratory failure which impairs their ability to perform daily activities like bathing, dressing and toileting in the home.  A walker or cane will not resolve the issues with performing these ADLs.  A high strength lightweight wheelchair will allow patient to safely perform daily activities.  This wheelchair will be used primarily in the home setting.  The patient is not able to propel themselves in the home using a standard or lightweight wheelchair due to arm weakness and endurance.  Patient can self propel in the high strength lightweight wheelchair.  I have personally performed a face to face evaluation of this patient and recommend this wheelchair.   Reesa Chew, PA-C

## 2019-05-06 NOTE — Progress Notes (Signed)
Occupational Therapy Session Note  Patient Details  Name: Rebecca Bruce MRN: [id number] Date of Birth: May 24, 1960  Today's Date: 05/06/2019 OT Individual Time: 0935-1000 OT Individual Time Calculation (min): 25 min   Session 2: OT Individual Time: 1100-1140 OT Individual Time Calculation (min):  40 min    Short Term Goals: Week 2:  OT Short Term Goal 1 (Week 2): Pt will complete 1 functional transfer with mod-max A OT Short Term Goal 2 (Week 2): Pt will complete bed mobility with only min-mod cueing for initiation OT Short Term Goal 3 (Week 2): Pt will complete grooming tasks EOB with set up assist  Skilled Therapeutic Interventions/Progress Updates:    Pt received supine with no c/o pain, finishing ice cream. Pt given sip of milk to ensure no oral residue. Pt declined oral care. Pt completed bed mobility rolling R and L x5 for peri hygiene and brief change. Pt with incontinent BM. Pt required max A for all toileting tasks. Pt able to wash face with washcloth with set up assist. Pt was left supine with all needs met, bed alarm set.   Session 2: Pt received supine with no c/o pain. Pt required excessive amounts of time to initiate and come EOB. Max multimodal cueing provided for pt to initiate any type of movement this session, with pt beginning to initiate but then screaming if therapist even mentioned assisting. Eventually pt returned her legs and chest to supine following therapist cues and stopping EOB attempts. Pt completed UB bathing and dressing at bed level with max A, pt cooperative and accepting assistance. Pt donned pants supine with max A, rolling R and L with mod A. Pt was left supine with all needs met, bed alarm set.    Therapy Documentation Precautions:  Precautions Precautions: Fall Precaution Comments: intellectual disability at baseline Restrictions Weight Bearing Restrictions: No   Therapy/Group: Individual Therapy  Curtis Sites 05/06/2019, 6:54 AM

## 2019-05-06 NOTE — Progress Notes (Signed)
Speech Language Pathology Daily Session Note  Patient Details  Name: Lenix Mccormac MRN: [id number] Date of Birth: 1960/11/07  Today's Date: 05/06/2019 SLP Individual Time: 1305-1350 SLP Individual Time Calculation (min): 45 min  Short Term Goals: Week 2: SLP Short Term Goal 1 (Week 2): Pt will demonstrate sustained attention for 2 minute intervals with max A verbal cues for redirection. SLP Short Term Goal 2 (Week 2): Pt will follow basic 1 step commands in functional tasks with 70% accuracy given max A verbal cues. SLP Short Term Goal 3 (Week 2): Patient will consume current diet with minimal overt s/s of aspiration with Min A verbal cues for use of swallowing compensatory strategies. SLP Short Term Goal 4 (Week 2): Patient will demonstrate efficient mastication and complete oral clearance with trials of Dys. 3 textures over 2 sessions with Min A verbal cues prior to upgrade.  Skilled Therapeutic Interventions: Skilled treatment session focused on cognitive goals. SLP facilitated session by providing Mod A verbal cues for sustained attention and Max A verbal cues for problem solving during a basic PEG task. Patient declined to sit EOB to complete task despite Max encouragement and was consistently to "put the bed back down." Patient left upright in bed with alarm on and all needs within reach. Continue with current plan of care.      Pain No/Denies Pain   Therapy/Group: Individual Therapy  Laurajean Hosek 05/06/2019, 3:04 PM

## 2019-05-07 ENCOUNTER — Inpatient Hospital Stay (HOSPITAL_COMMUNITY): Payer: Medicare Other | Admitting: Physical Therapy

## 2019-05-07 ENCOUNTER — Inpatient Hospital Stay (HOSPITAL_COMMUNITY): Payer: Medicare Other | Admitting: Speech Pathology

## 2019-05-07 ENCOUNTER — Inpatient Hospital Stay (HOSPITAL_COMMUNITY): Payer: Medicare Other

## 2019-05-07 LAB — SARS CORONAVIRUS 2 (TAT 6-24 HRS): SARS Coronavirus 2: NEGATIVE

## 2019-05-07 LAB — CBC
HCT: 34.3 % — ABNORMAL LOW (ref 36.0–46.0)
Hemoglobin: 10.9 g/dL — ABNORMAL LOW (ref 12.0–15.0)
MCH: 28.3 pg (ref 26.0–34.0)
MCHC: 31.8 g/dL (ref 30.0–36.0)
MCV: 89.1 fL (ref 80.0–100.0)
Platelets: 355 10*3/uL (ref 150–400)
RBC: 3.85 MIL/uL — ABNORMAL LOW (ref 3.87–5.11)
RDW: 17.4 % — ABNORMAL HIGH (ref 11.5–15.5)
WBC: 9.2 10*3/uL (ref 4.0–10.5)
nRBC: 0 % (ref 0.0–0.2)

## 2019-05-07 LAB — GLUCOSE, CAPILLARY
Glucose-Capillary: 86 mg/dL (ref 70–99)
Glucose-Capillary: 89 mg/dL (ref 70–99)
Glucose-Capillary: 106 mg/dL — ABNORMAL HIGH (ref 70–99)
Glucose-Capillary: 84 mg/dL (ref 70–99)

## 2019-05-07 LAB — BASIC METABOLIC PANEL
Anion gap: 8 (ref 5–15)
BUN: 13 mg/dL (ref 6–20)
CO2: 25 mmol/L (ref 22–32)
Calcium: 8.9 mg/dL (ref 8.9–10.3)
Chloride: 104 mmol/L (ref 98–111)
Creatinine, Ser: 0.99 mg/dL (ref 0.44–1.00)
GFR calc Af Amer: >60 mL/min (ref >60)
GFR calc non Af Amer: >60 mL/min (ref >60)
Glucose, Bld: 114 mg/dL — ABNORMAL HIGH (ref 70–99)
Potassium: 4.1 mmol/L (ref 3.5–5.1)
Sodium: 137 mmol/L (ref 135–145)

## 2019-05-07 MED ORDER — PAROXETINE HCL 30 MG PO TABS
30.0000 mg | ORAL_TABLET | Freq: Every day | ORAL | Status: DC
  Administered 2019-05-07: 30 mg via ORAL
  Filled 2019-05-07 (×2): qty 1

## 2019-05-07 MED ORDER — ACETAMINOPHEN 325 MG PO TABS: 325.0000 mg | ORAL_TABLET | ORAL | Status: AC | PRN

## 2019-05-07 MED ORDER — GABAPENTIN 300 MG PO CAPS: 300.0000 mg | ORAL_CAPSULE | Freq: Every day | ORAL | Status: DC

## 2019-05-07 MED ORDER — ATORVASTATIN CALCIUM 10 MG PO TABS
20.0000 mg | ORAL_TABLET | Freq: Every day | ORAL | Status: DC
  Administered 2019-05-07: 20 mg via ORAL
  Filled 2019-05-07: qty 2

## 2019-05-07 MED ORDER — PAROXETINE HCL 30 MG PO TABS: 30.0000 mg | ORAL_TABLET | Freq: Every day | ORAL | Status: DC

## 2019-05-07 MED ORDER — GABAPENTIN 300 MG PO CAPS
300.0000 mg | ORAL_CAPSULE | Freq: Every day | ORAL | Status: DC
  Administered 2019-05-07: 300 mg via ORAL
  Filled 2019-05-07: qty 1

## 2019-05-07 MED ORDER — SACCHAROMYCES BOULARDII 250 MG PO CAPS
250.0000 mg | ORAL_CAPSULE | Freq: Every day | ORAL | Status: DC
  Administered 2019-05-07 – 2019-05-08 (×2): 250 mg via ORAL
  Filled 2019-05-07 (×2): qty 1

## 2019-05-07 NOTE — Progress Notes (Signed)
Physical Therapy Session Note  Patient Details  Name: Rebecca Bruce MRN: [id number] Date of Birth: 06/18/1960  Today's Date: 05/07/2019 PT Individual Time: 1120-1145 PT Individual Time Calculation (min): 25 min   Short Term Goals: Week 3:  PT Short Term Goal 1 (Week 3): =LTGs due to ELOS  Skilled Therapeutic Interventions/Progress Updates:   Pt in supine and agreeable to therapy w/ encouragement, no c/o pain. Supervision bed mobility w/ increased time and max verbal/visual cues to sequence and motor plan task. Threaded pants w/ total assist at EOB. Max encouragement for 10+ minutes to stand up to pull pants over hips. Pt very easily distracted internally and by environment, max verbal reminders of task at hand. Pt returned to supine w/ supervision and then requested assistance w/ pulling pants over hips. Pt performed R/L rolling w/ supervision and verbal/tactile cues for technique. Total assist to pull pants over hips. Ended session in supine and all needs in reach.   Therapy Documentation Precautions:  Precautions Precautions: Fall Precaution Comments: intellectual disability at baseline Restrictions Weight Bearing Restrictions: No  Therapy/Group: Individual Therapy  Rebecca Bruce 05/07/2019, 11:54 AM

## 2019-05-07 NOTE — Progress Notes (Signed)
[facility] PHYSICAL MEDICINE & REHABILITATION PROGRESS NOTE   Subjective/Complaints: No new problems. Awake and alert in bed  ROS: Limited due to cognitive/behavioral   Objective:   No results found. No results for input(s): WBC, HGB, HCT, PLT in the last 72 hours. No results for input(s): NA, K, CL, CO2, GLUCOSE, BUN, CREATININE, CALCIUM in the last 72 hours.  Intake/Output Summary (Last 24 hours) at 05/07/2019 0941 Last data filed at 05/07/2019 0807 Gross per 24 hour  Intake 480 ml  Output 800 ml  Net -320 ml     Physical Exam: Vital Signs Blood pressure 120/81, pulse 84, temperature 99.2 F (37.3 C), temperature source Oral, resp. rate 19, weight 78.8 kg, SpO2 100 %. Constitutional: No distress . Vital signs reviewed. HEENT: EOMI, oral membranes moist Neck: supple Cardiovascular: RRR without murmur. No JVD    Respiratory: CTA Bilaterally without wheezes or rales. Normal effort    GI: BS +, non-tender, non-distended  Skin: Warm and dry.  Intact. Psych: pleasant Musc: No edema in extremities.  No tenderness in extremities. Neurologic: alert, minimal insight and awareness. Follows basic commands Motor: Bilateral upper extremities: Grossly 4/5 proximal distal--stable exam   Assessment/Plan: 1. Functional deficits secondary to covid encephalopathy which require 3+ hours per day of interdisciplinary therapy in a comprehensive inpatient rehab setting.  Physiatrist is providing close team supervision and 24 hour management of active medical problems listed below.  Physiatrist and rehab team continue to assess barriers to discharge/monitor patient progress toward functional and medical goals  Care Tool:  Bathing    Body parts bathed by patient: Chest, Abdomen, Face   Body parts bathed by helper: Right arm, Left arm, Left upper leg, Right upper leg, Right lower leg, Left lower leg, Front perineal area, Buttocks     Bathing assist Assist Level: Maximal Assistance -  Patient 24 - 49%     Upper Body Dressing/Undressing Upper body dressing   What is the patient wearing?: Pull over shirt    Upper body assist Assist Level: Maximal Assistance - Patient 25 - 49%    Lower Body Dressing/Undressing Lower body dressing    Lower body dressing activity did not occur: Refused What is the patient wearing?: Pants     Lower body assist Assist for lower body dressing: Maximal Assistance - Patient 25 - 49%     Toileting Toileting Toileting Activity did not occur (Clothing management and hygiene only): Refused  Toileting assist Assist for toileting: Total Assistance - Patient < 25%     Transfers Chair/bed transfer  Transfers assist  Chair/bed transfer activity did not occur: Refused  Chair/bed transfer assist level: Total Assistance - Patient < 25%(squat pivot to the bedside recliner)     Locomotion Ambulation   Ambulation assist   Ambulation activity did not occur: Refused          Walk 10 feet activity   Assist  Walk 10 feet activity did not occur: Refused        Walk 50 feet activity   Assist Walk 50 feet with 2 turns activity did not occur: Refused         Walk 150 feet activity   Assist Walk 150 feet activity did not occur: Refused         Walk 10 feet on uneven surface  activity   Assist Walk 10 feet on uneven surfaces activity did not occur: Sales promotion account executive  Wheelchair activity did not occur: Refused         Wheelchair 50 feet with 2 turns activity    Assist    Wheelchair 50 feet with 2 turns activity did not occur: Refused       Wheelchair 150 feet activity     Assist  Wheelchair 150 feet activity did not occur: Refused       Blood pressure 120/81, pulse 84, temperature 99.2 F (37.3 C), temperature source Oral, resp. rate 19, weight 78.8 kg, SpO2 100 %.  Medical Problem List and Plan: 1. Debility and encephalopathy secondary to Covid  19  Continue CIR as possible  -return to [facility] tomorrow, finalize dc planning  2. Antithrombotics: -DVT/anticoagulation:Mechanical:Sequential compression devices, below kneeBilateral lower extremities.   -continue lovenox 40mg  daily -antiplatelet therapy: N/A 3. Pain Management:Was on Gabapentin 300 mg/hs PTA. Tylenol prn for now. 4. Mood:  consistent schedule/familiar faces to help with security/compliance  -suspect cognition is at baseline -antipsychotic agents: N/A 5. Neuropsych: This patientis not capable of making decisions on herown behalf.  6. Skin/Wound Care:Routine pressure relief measure.  -  eucerin cream for dry feet 7. Fluids/Electrolytes/Nutrition:encourage PO, monitor for oral pocketing.   Hypokalemia-potassium 3.9---recheck today 8. Multifactorial encephalopathy post Covid: Catatonia has resolved.   - agitation better  -cognition likely close to baseline 9. ABLA: Has been transfused with 4 units PRBC.  Continue to monitor with serial checks.   Hemoglobin 11.2 on 1/21    10. Malnutrition: Intake limited to certain foods--offer supplements thorough out the day.   1/11 -added megace as appetite stimulant, haven't seen a lot of progress however  1/18 P.o. intake poor to inconsistent   -off megace as it may be contributing to loose stool, incontinence   1/26 improved 11. Multilevel Cervical stenosis: Follow up with Dr. Annette Stable after discharge.  12. H/o major depressive disorder: home paxil resumed   13. Prediabetes:   Relatively controlled on 1/20, 1/25 14. Elevated LFT's: trending down 1/18 15. Persistent leukocytosis: 11/1 16.1  WBCs 14.2 on 1/14   Afebrile   1/21 15.7   1/24 100K Citrobacter in urine:  abx changed to bactrim yesterday---continue for seven day course  1/26 recheck labs today 16. Dysphagia: D2 thin diet  -advance per SLP,     LOS: 16 days A FACE TO FACE EVALUATION WAS PERFORMED  Rebecca Bruce 05/07/2019, 9:41  AM

## 2019-05-07 NOTE — Progress Notes (Signed)
Occupational Therapy Discharge Summary  Patient Details  Name: Rebecca Bruce MRN: [id number] Date of Birth: 10-Aug-1960  Today's Date: 05/07/2019 OT Individual Time: 1030-1100 OT Individual Time Calculation (min): 30 min    Patient has met 3 of 7 long term goals due to improved activity tolerance, improved balance, postural control, improved awareness and improved coordination.  Patient to discharge at overall Mod-max Assist level.  Patient's care partner is independent to provide the necessary physical and cognitive assistance at discharge.  Pt has lived at a group home for 20+ years. Her caregivers agree that her needs will be best met in a familiar setting with familiar caregivers.   Reasons goals not met: Baseline ID has limited participation and motivation in therapy. Pt has demonstrated a transfer of min A with a familiar caregiver but has refused any transfers with OT staff, therefore goals remain unmet.   Recommendation:  Patient will benefit from ongoing skilled OT services in home health setting to continue to advance functional skills in the area of BADL.  Equipment: No equipment provided  Reasons for discharge: lack of progress toward goals and discharge from hospital  Patient/family agrees with progress made and goals achieved: Yes   Skilled OT Intervention: Pt received supine with no c/o pain. Pt completed UB bathing with min A. Mod A to don shirt at bed level. Pt declining to come EOB this session. Mod A to wash LB at bed level. Max A for LB dressing. Pt completed oral care at bed level with cueing for task progression, no physical assistance. Pt was left supine with all needs met, bed alarm set.   OT Discharge Precautions/Restrictions  Precautions Precautions: Fall Precaution Comments: intellectual disability at baseline Restrictions Weight Bearing Restrictions: No Pain Pain Assessment Pain Scale: 0-10 Pain Score: 0-No pain Faces Pain Scale: No hurt     Vision Baseline Vision/History: No visual deficits Patient Visual Report: No change from baseline Vision Assessment?: No apparent visual deficits Perception  Perception: Within Functional Limits Praxis Praxis: Impaired Praxis Impairment Details: Ideation;Perseveration;Initiation Praxis-Other Comments: Likely baseline 2/2 ID Cognition Overall Cognitive Status: History of cognitive impairments - at baseline Arousal/Alertness: Awake/alert Orientation Level: Oriented to person Attention: Sustained Sustained Attention: Impaired Sustained Attention Impairment: Verbal basic;Functional basic Memory: Impaired Memory Impairment: Decreased recall of new information;Decreased short term memory Awareness Impairment: Intellectual impairment Problem Solving: Impaired Problem Solving Impairment: Verbal basic;Functional basic Executive Function: (All impaired 2/2 ID at baseline) Sensation Sensation Light Touch: Appears Intact Hot/Cold: Appears Intact Proprioception: Appears Intact Coordination Gross Motor Movements are Fluid and Coordinated: No Fine Motor Movements are Fluid and Coordinated: Yes Coordination and Movement Description: Generalized weakness present. Finger Nose Finger Test: [facility] Motor  Motor Motor: Motor apraxia Motor - Discharge Observations: generalized weakness Mobility  Bed Mobility Bed Mobility: Sit to Supine Rolling Right: Minimal Assistance - Patient > 75% Supine to Sit: Minimal Assistance - Patient > 75% Sit to Supine: Minimal Assistance - Patient > 75% Transfers Sit to Stand: Moderate Assistance - Patient 50-74% Stand to Sit: Moderate Assistance - Patient 50-74%  Trunk/Postural Assessment  Cervical Assessment Cervical Assessment: Within Functional Limits Thoracic Assessment Thoracic Assessment: Within Functional Limits Lumbar Assessment Lumbar Assessment: Exceptions to WFL(posterior pelvic tilt) Postural Control Postural Control: Deficits on  evaluation Righting Reactions: delayed Protective Responses: delayed  Balance Balance Balance Assessed: Yes Static Sitting Balance Static Sitting - Balance Support: Feet unsupported;Bilateral upper extremity supported Static Sitting - Level of Assistance: 7: Independent Dynamic Sitting Balance Dynamic Sitting - Balance Support: Feet  unsupported;No upper extremity supported Dynamic Sitting - Level of Assistance: 7: Independent Extremity/Trunk Assessment RUE Assessment RUE Assessment: Exceptions to [facility] General Strength Comments: Generalized weakness LUE Assessment LUE Assessment: Exceptions to [facility] General Strength Comments: Limited AROM, unable to formally assess   Curtis Sites 05/07/2019, 10:58 AM

## 2019-05-07 NOTE — Discharge Summary (Signed)
Physician Discharge Summary  Patient ID: Rebecca Bruce MRN: [id number] DOB/AGE: [birth year] 58 y.o.  Admit date: 04/21/2019 Discharge date: 05/08/2019  Discharge Diagnoses:  Principal Problem:   Encephalopathy Active Problems:   Hypertension, benign   Pressure injury of skin   Dysphagia   Transaminitis   Prediabetes   UTI (urinary tract infection)   Discharged Condition: stable   Significant Diagnostic Studies: No results found.  Labs:  Basic Metabolic Panel: BMP Latest Ref Rng & Units 05/07/2019 04/29/2019 04/25/2019  Glucose 70 - 99 mg/dL 114(H) 93 106(H)  BUN 6 - 20 mg/dL 13 11 14   Creatinine 0.44 - 1.00 mg/dL 0.99 0.94 0.95  BUN/Creat Ratio 6 - 22 (calc) - - -  Sodium 135 - 145 mmol/L 137 141 141  Potassium 3.5 - 5.1 mmol/L 4.1 3.9 3.6  Chloride 98 - 111 mmol/L 104 105 103  CO2 22 - 32 mmol/L 25 27 28   Calcium 8.9 - 10.3 mg/dL 8.9 9.2 8.9    CBC: Recent Labs  Lab 05/02/19 0622 05/07/19 1019  WBC 15.7* 9.2  HGB 11.2* 10.9*  HCT 35.6* 34.3*  MCV 89.4 89.1  PLT 455* 355    CBG: Recent Labs  Lab 05/07/19 0616 05/07/19 1142 05/07/19 1648 05/07/19 2119 05/08/19 0639  GLUCAP 86 89 106* 84 95    Brief HPI:   Rebecca Bruce is a 59 y.o. female with history of MR with depression/anxiety who was admitted from her group home on 03/09/2019 with sepsis due to UTI and Covid PNA.  She was treated with dexamethasone and remdesivir and was noted to be nonverbal with whole body stiffness and unresponsiveness since admission.  EEG showed diffuse encephalopathy MRI brain was negative for acute changes.  CT cervical spine done due to neck stiffness and showed multilevel stenosis.  Neurology felt that patient with paratonic with question of retarded catatonia and she was treated with antibacterial meningitic treatment.  Dr. Trenton Gammon with neurosurgery question myelopathy is contributing to patient's rigidity and flexor withdrawal but did not feel surgery was indicated.     Hospital course significant for acute blood loss anemia due to spontaneous hemoperitoneum of unknown etiology requiring transfusion with 4 units PRBC.  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  Patient has had issues with multifactorial encephalopathy as well as weakness due to prolonged immobility from intubation.  Supportive care was recommended as it was felt that patient would require weeks to months for recovery.  She was placed on modified diet by speech therapy.  Therapy was ongoing but she was noted to be severely debilitated and CIR was recommended for follow-up therapy   Hospital Course: Rebecca Bruce was admitted to rehab 04/21/2019 for inpatient therapies to consist of PT, ST and OT at least three hours five days a week. Past admission physiatrist, therapy team and rehab RN have worked together to provide customized collaborative inpatient rehab. Blood pressures were monitored on TID basis and have shown improvement. Her intake has been very poor on modified diet. Multiple supplements were offered with variable intake. Megace added without improvement intake but onset of loose stools therefore this was discontinued. Home dose prozac was resumed at admission. She continues to have occasional outbursts and Mood is relatively stable on Prozac and gabapentin was resumed prior to discharge. Serial CBC showed that leucocytosis has resolved and H/H is stable.   Follow up [facility] showed that  hypokalemia has resolved with addition of supplement and renal status is stable. Elevated LFTs are trending down. She is incontinent of bowel and bladder and refuses to be toileted or changed frequently.  She was found to have citrobacter UTI and started on Septra DS for treatment. She has had difficulty with voiding and was found to have urinary retention. She is  requiring I/O caths every 8 hours due to no void and felt to be behavioral in nature.  Anticipate voiding function will improve with treatment of UTI and back in more familiar setting. If she continues to have problems voiding she will need to follow up with GU for input. She has made limited gains during rehab stay and is currently at mod to max assist.  She will continue to receive follow up PT and OT after discharge   Rehab course: During patient's stay in rehab weekly team conferences were held to monitor patient's progress, set goals and discuss barriers to discharge. At admission, patient required total assist for mobility and max assist with basic ADL tasks.  She exhibited severe cognitive linguistic deficits requiring total to max assist for basic problem-solving, was oriented to self only and was able to follow one-step command with 60% accuracy.  She considered limited amount of food but was tolerating this without overt signs or symptoms of aspiration. She  has had improvement in activity tolerance, balance, postural control as well as ability to compensate for deficits. She has had improvement in functional use RUE/LUE  and RLE/LLE as well as improvement in awareness. She requires min assist for upper body bathing, mod assist for upper body dressing, mod assist for lower body bathing and max assist for lower body dressing. She requires max visual and verbal cues to sequence and motor planning for bed mobility.  She requires supervision for bed mobility and mod to max assist for transfers.  She has refused attempts at wheelchair mobility or gait.  Family education has been done with group home caregivers.  Patient's participation has improved when family caregiver present and anticipate patient's participation and progress will improve once back in a more familiar setting.  Her caregivers feel that they be able to provide the care needed after discharge.   Disposition:  Rest home.    Diet:  Dysphagia 2, thin liquids.   Special Instructions: 1. Toilet patient every 4 hours. Cath if no void or not incontinent in 8 hours.  2. Septra to continue thorough 1/30 pm.  3. Will need repeat BMET in a week to monitor potassium levels.   Allergies as of 05/08/2019   No Known Allergies     Medication List    STOP taking these medications   feeding supplement (ENSURE ENLIVE) Liqd   feeding supplement (PRO-STAT SUGAR FREE 64) Liqd   hydrOXYzine 25 MG tablet Commonly known as: ATARAX/VISTARIL   thiamine 100 MG tablet     TAKE these medications   acetaminophen 325 MG tablet Commonly known as: TYLENOL Take 1-2 tablets (325-650 mg total) by mouth every 4 (four) hours as needed for mild pain.   ascorbic acid 500 MG/5ML syrup Commonly known as: VITAMIN C Take 5 mLs (500 mg total) by mouth daily.   atorvastatin 20 MG tablet Commonly known as: LIPITOR Take 1 tablet (20 mg total) by mouth daily at 6 PM.   gabapentin 300 MG capsule Commonly known as: NEURONTIN Take 1 capsule (300 mg total) by mouth at bedtime.   hydrocerin Crea Apply 1 application topically daily. To  bilateral feet Start taking on: May 09, 2019   Ipratropium-Albuterol 20-100 MCG/ACT Aers respimat Commonly known as: COMBIVENT Inhale 1 puff into the lungs every 6 (six) hours as needed for wheezing.   levothyroxine 100 MCG tablet Commonly known as: SYNTHROID Take 1 tablet (100 mcg total) by mouth daily before breakfast.   LORazepam 0.5 MG tablet Commonly known as: ATIVAN Take 0.5 mg by mouth daily as needed for anxiety. Can take an extra pill 30 minutes after the first dose if needed   multivitamin with minerals Tabs tablet Take 1 tablet by mouth daily.   PARoxetine 30 MG tablet Commonly known as: PAXIL Take 1 tablet (30 mg total) by mouth at bedtime. What changed: See the new instructions.   potassium chloride 20 MEQ packet Commonly known as: KLOR-CON Take 20 mEq by mouth 2 (two) times daily.    saccharomyces boulardii 250 MG capsule Commonly known as: FLORASTOR Take 1 capsule (250 mg total) by mouth daily. Start taking on: May 09, 2019   sulfamethoxazole-trimethoprim 800-160 MG tablet Commonly known as: BACTRIM DS Take 1 tablet by mouth every 12 (twelve) hours.   Viactiv Calcium Plus D 650-12.5-40 MG-MCG-MCG Chew Generic drug: Calcium-Vitamin D-Vitamin K CHEW AND SWALLOW ONE PIECE TWICE DAILY. (SUPPLEMENT) CARAMEL What changed: See the new instructions.   zinc sulfate 220 (50 Zn) MG capsule Take 1 capsule (220 mg total) by mouth daily.      Follow-up Information    [name], MD. Call.   Specialty: Physical Medicine and Rehabilitation Why: as needed Contact information: [address] [place] [address] [phone]        Steele Sizer, MD. Call on 05/09/2019.   Specialty: Family Medicine Why: for post hospital follow up appointment Contact information: [address] [place] [address] [phone]           Signed: Bary Leriche 05/08/2019, 5:23 PM

## 2019-05-07 NOTE — Patient Care Conference (Signed)
Inpatient RehabilitationTeam Conference and Plan of Care Update Date: 05/07/2019   Time: 10:20 AM    Patient Name: Rebecca Bruce      Medical Record Number: [id number]  Date of Birth: February 23, 1961 Sex: Female         Room/Bed: 4M09C/4M09C-01 Payor Info: Payor: MEDICARE / Plan: MEDICARE PART A AND B / Product Type: *No Product type* /    Admit Date/Time:  04/21/2019  5:20 PM  Primary Diagnosis:  Encephalopathy  Patient Active Problem List   Diagnosis Date Noted  . Blood pressure increase diastolic   . Leukocytosis   . Transaminitis   . Prediabetes   . Hypokalemia   . Dysphagia 04/24/2019  . Encephalopathy 04/21/2019  . FTT (failure to thrive) in adult 04/09/2019  . Acute blood loss anemia   . Hemorrhagic shock ([facility])   . Pressure injury of skin 03/28/2019  . Encounter for intubation   . Catatonia   . Acute respiratory failure with hypoxemia ([facility])   . COVID-19 virus detected   . Acute metabolic encephalopathy 02/58/5277  . Acute cystitis without hematuria   . COVID-19 virus infection   . Altered mental state 03/09/2019  . Sepsis secondary to UTI ([facility]) 03/09/2019  . 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 Hyperglycemia 07/13/2018  . Pre-diabetes 01/05/2017  . Other symptoms and signs involving cognitive functions and awareness 09/01/2015  . Dyslipidemia 07/02/2015  . History of hyperthyroidism 07/02/2015  . History of radioactive iodine thyroid ablation 07/02/2015  . Hypothyroidism (acquired) 07/02/2015  . Obesity 07/02/2015  . Hypertension, benign 07/02/2015  . Anxiety 07/02/2015  . Vitamin D deficiency 07/02/2015  . Insomnia 07/02/2015  . OA (osteoarthritis) of knee 07/02/2015  . GERD (gastroesophageal reflux disease) 07/02/2015  . Hyperlipidemia, unspecified 07/02/2015    Expected Discharge Date: Expected Discharge Date: 05/08/19  Team Members Present: Physician leading conference: Dr. Alger Simons Social Worker Present: Lennart Pall, LCSW Nurse  Present: Dorien Chihuahua, RN;Mekides Nida, RN Case Manager: Karene Fry, RN PT Present: Burnard Bunting, PT OT Present: Laverle Hobby, OT SLP Present: Weston Anna, SLP PPS Coordinator present : Gunnar Fusi, SLP     Current Status/Progress Goal Weekly Team Focus  Bowel/Bladder   pt incintinent of B/B lbm 05/06/19  Baseline toileting pattern  asses qshift and prn    Swallow/Nutrition/ Hydration   Dys. 2 textures with thin liquids, supervision  Supervision  D/C tomorrow   ADL's   mod-max A ADLs overall. Limited by behavior and cognition.  Mod A overall  ADL retraining, OOB encouragement, functional activity tolerance   Mobility   supervision bed mobility, mod assist sit>stands, max assist stand/squat pivot transfer, continues to need max encouragement and cues for motor planning/sequencing  supervision bed mobility, min assist sit>stand, mod assist transfers  standing and transfers, motor planning, initiation, functional strength and endurance   Communication   supervision  Supervision A  Goal Met, D/C tomorrow   Safety/Cognition/ Behavioral Observations  At baseline  at baseline  D/C tomorrow   Pain   pt fenies any current pain   pt will remain pain free  assess pain q shift and prn    Skin   skin dry and stage 2 buttocks , foam in place   Pt will be free of further skin breakdown and infection.  assess skin q shift and prn     Rehab Goals Patient on target to meet rehab goals: Yes *See Care Plan and progress notes for long and short-term goals.  Barriers to Discharge  Current Status/Progress Possible Resolutions Date Resolved   Nursing                  PT                    OT                  SLP                SW                Discharge Planning/Teaching Needs:  Pt to d/c to group home - DON has observed and agreed to return -  Teaching completed with DON   Team Discussion: Meds adjusted, ready for DC tomorrow.  RN incontinent, BM 1/26, I+O cath 4 am.  PT mod/max  transfers, S bed, mod A stand, will pick up in w/c van.  SLP at baseline, eating softer food.  OT mod/max A overall.   Revisions to Treatment Plan: N/A     Medical Summary Current Status: eating better. rx'ed UTI. mood stable. more alert Weekly Focus/Goal: finalize dc planning  Barriers to Discharge: Behavior       Continued Need for Acute Rehabilitation Level of Care: The patient requires daily medical management by a physician with specialized training in physical medicine and rehabilitation for the following reasons: Direction of a multidisciplinary physical rehabilitation program to maximize functional independence : Yes Medical management of patient stability for increased activity during participation in an intensive rehabilitation regime.: Yes Analysis of laboratory values and/or radiology reports with any subsequent need for medication adjustment and/or medical intervention. : Yes   I attest that I was present, lead the team conference, and concur with the assessment and plan of the team.   Jodell Cipro M 05/07/2019, 4:25 PM   Team conference was held via web/ teleconference due to [place] - 19

## 2019-05-07 NOTE — NC FL2 (Signed)
[place] MEDICAID FL2 LEVEL OF CARE SCREENING TOOL     IDENTIFICATION  Patient Name: Rebecca Bruce Birthdate: 1960-11-21 Sex: female Admission Date (Current Location): 04/21/2019  [place] and [name] Number:  [facility] Bast [id number] K Facility and Address:  [facility], [address] [place], [place] [address]      Provider Number: [id number]  Attending Physician Name and Address:  Ranelle Oyster, MD  Relative Name and Phone Number:       Current Level of Care: Other (Comment)(Acute Inpatient Rehab) Recommended Level of Care: Family Care Home Prior Approval Number:    Date Approved/Denied:   PASRR Number:    Discharge Plan: Domiciliary (Rest home)(Family Care Home)    Current Diagnoses: Patient Active Problem List   Diagnosis Date Noted  . Blood pressure increase diastolic   . Leukocytosis   . Transaminitis   . Prediabetes   . Hypokalemia   . Dysphagia 04/24/2019  . Encephalopathy 04/21/2019  . FTT (failure to thrive) in adult 04/09/2019  . Acute blood loss anemia   . Hemorrhagic shock ([facility])   . Pressure injury of skin 03/28/2019  . Encounter for intubation   . Catatonia   . Acute respiratory failure with hypoxemia ([facility])   . COVID-19 virus detected   . Acute metabolic encephalopathy 03/11/2019  . Acute cystitis without hematuria   . COVID-19 virus infection   . Altered mental state 03/09/2019  . Sepsis secondary to UTI ([facility]) 03/09/2019  . 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 Hyperglycemia 07/13/2018  . Pre-diabetes 01/05/2017  . Other symptoms and signs involving cognitive functions and awareness 09/01/2015  . Dyslipidemia 07/02/2015  . History of hyperthyroidism 07/02/2015  . History of radioactive iodine thyroid ablation 07/02/2015  . Hypothyroidism (acquired) 07/02/2015  . Obesity 07/02/2015  . Hypertension, benign 07/02/2015  . Anxiety 07/02/2015  . Vitamin D deficiency 07/02/2015  . Insomnia 07/02/2015  .  OA (osteoarthritis) of knee 07/02/2015  . GERD (gastroesophageal reflux disease) 07/02/2015  . Hyperlipidemia, unspecified 07/02/2015    Orientation RESPIRATION BLADDER Height & Weight     Self  Normal Incontinent Weight: 173 lb 11.6 oz (78.8 kg) Height:     BEHAVIORAL SYMPTOMS/MOOD NEUROLOGICAL BOWEL NUTRITION STATUS      Incontinent    AMBULATORY STATUS COMMUNICATION OF NEEDS Skin   Limited Assist Verbally Normal                       Personal Care Assistance Level of Assistance  Bathing, Dressing, Total care Bathing Assistance: Maximum assistance   Dressing Assistance: Limited assistance Total Care Assistance: Maximum assistance   Functional Limitations Info  Speech     Speech Info: Impaired    SPECIAL CARE FACTORS FREQUENCY  PT (By licensed PT), OT (By licensed OT), Speech therapy     PT Frequency: 2x/wk OT Frequency: 2x/wk     Speech Therapy Frequency: 2x/wk      Contractures Contractures Info: Not present    Additional Factors Info  Code Status, Psychotropic Code Status Info: full   Psychotropic Info: see MAR         Current Medications (05/07/2019):  This is the current hospital active medication list Current Facility-Administered Medications  Medication Dose Route Frequency Provider Last Rate Last Admin  . acetaminophen (TYLENOL) tablet 325-650 mg  325-650 mg Oral Q4H PRN Love, Pamela S, PA-C      . alum & mag hydroxide-simeth (MAALOX/MYLANTA) 200-200-20 MG/5ML suspension 30 mL  30 mL  Oral Q4H PRN Love, Pamela S, PA-C      . ascorbic acid (VITAMIN C) tablet 500 mg  500 mg Oral Daily Jacquelynn Cree, PA-C   500 mg at 05/07/19 0908  . atorvastatin (LIPITOR) tablet 20 mg  20 mg Oral q1800 Faith Rogue T, MD      . bisacodyl (DULCOLAX) suppository 10 mg  10 mg Rectal Daily PRN Love, Pamela S, PA-C      . diphenhydrAMINE (BENADRYL) 12.5 MG/5ML elixir 12.5-25 mg  12.5-25 mg Oral Q6H PRN Love, Pamela S, PA-C      . enoxaparin (LOVENOX) injection 40  mg  40 mg Subcutaneous Q24H Ranelle Oyster, MD   40 mg at 05/07/19 1405  . gabapentin (NEURONTIN) capsule 300 mg  300 mg Oral QHS Love, Pamela S, PA-C      . guaiFENesin-dextromethorphan (ROBITUSSIN DM) 100-10 MG/5ML syrup 5-10 mL  5-10 mL Oral Q6H PRN Love, Evlyn Kanner, PA-C      . hydrocerin (EUCERIN) cream   Topical Daily Ranelle Oyster, MD   Given at 05/07/19 0915  . insulin aspart (novoLOG) injection 0-15 Units  0-15 Units Subcutaneous TID WC Jacquelynn Cree, PA-C   3 Units at 04/25/19 1836  . insulin aspart (novoLOG) injection 0-5 Units  0-5 Units Subcutaneous QHS Love, Pamela S, PA-C      . Ipratropium-Albuterol (COMBIVENT) respimat 1 puff  1 puff Inhalation Q6H PRN Love, Pamela S, PA-C      . levothyroxine (SYNTHROID) tablet 100 mcg  100 mcg Oral Q0600 Jacquelynn Cree, PA-C   100 mcg at 05/07/19 0546  . MEDLINE mouth rinse  15 mL Mouth Rinse q12n4p Jacquelynn Cree, PA-C   15 mL at 05/07/19 1247  . multivitamin with minerals tablet 1 tablet  1 tablet Oral Daily Jacquelynn Cree, PA-C   1 tablet at 05/07/19 0908  . PARoxetine (PAXIL) tablet 30 mg  30 mg Oral QHS Love, Pamela S, PA-C      . polyethylene glycol (MIRALAX / GLYCOLAX) packet 17 g  17 g Oral Daily PRN Love, Pamela S, PA-C      . potassium chloride (KLOR-CON) packet 20 mEq  20 mEq Oral BID Ranelle Oyster, MD   20 mEq at 05/07/19 0908  . prochlorperazine (COMPAZINE) tablet 5-10 mg  5-10 mg Oral Q6H PRN Love, Pamela S, PA-C       Or  . prochlorperazine (COMPAZINE) injection 5-10 mg  5-10 mg Intramuscular Q6H PRN Love, Pamela S, PA-C       Or  . prochlorperazine (COMPAZINE) suppository 12.5 mg  12.5 mg Rectal Q6H PRN Love, Pamela S, PA-C      . saccharomyces boulardii (FLORASTOR) capsule 250 mg  250 mg Oral Daily Ranelle Oyster, MD   250 mg at 05/07/19 1245  . sodium phosphate (FLEET) 7-19 GM/118ML enema 1 enema  1 enema Rectal Once PRN Love, Evlyn Kanner, PA-C      . sulfamethoxazole-trimethoprim (BACTRIM DS) 800-160 MG per tablet 1  tablet  1 tablet Oral Q12H Ranelle Oyster, MD   1 tablet at 05/07/19 0908  . traZODone (DESYREL) tablet 25-50 mg  25-50 mg Oral QHS PRN Jacquelynn Cree, PA-C   50 mg at 05/05/19 2158  . white petrolatum (VASELINE) gel 1 application  1 application Topical PRN Love, Pamela S, PA-C      . zinc sulfate capsule 220 mg  220 mg Oral Daily Love, Evlyn Kanner, PA-C   220  mg at 05/07/19 1610     Discharge Medications: Please see discharge summary for a list of discharge medications.  Relevant Imaging Results:  Relevant Lab Results:   Additional Information    Bailee Metter, LCSW

## 2019-05-07 NOTE — Progress Notes (Signed)
Physical Therapy Discharge Summary  Patient Details  Name: Rebecca Bruce MRN: [id number] Date of Birth: Aug 23, 1960  Patient has met 1 of 3 long term goals due to poor participation in therapies, impaired motor planning and sequencing, and poor attention span impacting meaningful participation in therapies. Pt's participation greatly improves in present of familiar nurse from her group home, [facility].     Patient to discharge at a wheelchair level, supervision bed mobility and mod-max assist for transfers, pt has refused attempts at w/c locomotion or gait.   Patient's care partner is independent to provide the necessary physical and cognitive assistance at discharge. Pt d/c back to group home where she has lived for many years. Pt lives there under direct supervision of caregivers and nurses who can provide this level of care. Main nurse, Butch Penny, has practiced hands on transfers and standing w/ pt.   Reasons goals not met: see note above  Recommendation:  Patient will benefit from ongoing skilled PT services in home health setting to continue to advance safe functional mobility, address ongoing impairments in endurance, global strength and conditioning, standing tolerance/balance, and progressing gait as able, and minimize fall risk.   Equipment: w/c and RW (RW for standing)   Reasons for discharge: lack of progress toward goals and discharge from hospital  Patient/family agrees with progress made and goals achieved: Yes  PT Discharge Precautions/Restrictions Precautions Precautions: Fall Precaution Comments: intellectual disability at baseline Restrictions Weight Bearing Restrictions: No Vital Signs Therapy Vitals Temp: 99.2 F (37.3 C) Temp Source: Oral Pulse Rate: 84 BP: 120/81 Patient Position (if appropriate): Lying Oxygen Therapy SpO2: 100 % O2 Device: Room Air Cognition Overall Cognitive Status: Impaired/Different from baseline Arousal/Alertness:  Awake/alert Orientation Level: Oriented to person Attention: Sustained Focused Attention: Appears intact Sustained Attention: Impaired Sustained Attention Impairment: Verbal basic;Functional basic Memory: Impaired Memory Impairment: Decreased recall of new information;Decreased short term memory Awareness Impairment: Intellectual impairment Problem Solving: Impaired Problem Solving Impairment: Verbal basic;Functional basic Safety/Judgment: Impaired Sensation Sensation Light Touch: Appears Intact Hot/Cold: Appears Intact Proprioception: Appears Intact Coordination Gross Motor Movements are Fluid and Coordinated: No Fine Motor Movements are Fluid and Coordinated: Yes Coordination and Movement Description: Generalized weakness present. Finger Nose Finger Test: [facility] Motor  Motor Motor: Motor apraxia Motor - Discharge Observations: generalized weakness  Mobility Bed Mobility Bed Mobility: Sit to Supine;Supine to Sit;Rolling Left;Rolling Right Rolling Right: Supervision/verbal cueing Rolling Left: Supervision/Verbal cueing Supine to Sit: Supervision/Verbal cueing Sit to Supine: Supervision/Verbal cueing Transfers Transfers: Sit to Stand;Stand to Sit;Squat Pivot Transfers Sit to Stand: Moderate Assistance - Patient 50-74% Stand to Sit: Moderate Assistance - Patient 50-74% Squat Pivot Transfers: Maximal Assistance - Patient 25-49% Transfer (Assistive device): 1 person hand held assist Locomotion  Gait Ambulation: No Gait Gait: No Stairs / Additional Locomotion Stairs: No Wheelchair Mobility Wheelchair Mobility: No  Trunk/Postural Assessment  Cervical Assessment Cervical Assessment: Within Functional Limits Thoracic Assessment Thoracic Assessment: Within Functional Limits Lumbar Assessment Lumbar Assessment: Exceptions to WFL(posterior pelvic tilt, increased flexion) Postural Control Postural Control: Deficits on evaluation(posterior lean bias)   Balance Balance Balance Assessed: Yes Static Sitting Balance Static Sitting - Balance Support: Feet unsupported;Bilateral upper extremity supported Static Sitting - Level of Assistance: 7: Independent Dynamic Sitting Balance Dynamic Sitting - Balance Support: Feet unsupported;No upper extremity supported Dynamic Sitting - Level of Assistance: 7: Independent Extremity Assessment  RLE Assessment RLE Assessment: Exceptions to WFL(Pt very resistant to any touch from therapist. RLE assessment performed via observation.) Passive Range of Motion (PROM) Comments: Appears globally  Physical Therapy Discharge Summary  Patient Details  Name: Rebecca Bruce MRN: [id number] Date of Birth: Aug 23, 1960  Patient has met 1 of 3 long term goals due to poor participation in therapies, impaired motor planning and sequencing, and poor attention span impacting meaningful participation in therapies. Pt's participation greatly improves in present of familiar nurse from her group home, [facility].     Patient to discharge at a wheelchair level, supervision bed mobility and mod-max assist for transfers, pt has refused attempts at w/c locomotion or gait.   Patient's care partner is independent to provide the necessary physical and cognitive assistance at discharge. Pt d/c back to group home where she has lived for many years. Pt lives there under direct supervision of caregivers and nurses who can provide this level of care. Main nurse, Butch Penny, has practiced hands on transfers and standing w/ pt.   Reasons goals not met: see note above  Recommendation:  Patient will benefit from ongoing skilled PT services in home health setting to continue to advance safe functional mobility, address ongoing impairments in endurance, global strength and conditioning, standing tolerance/balance, and progressing gait as able, and minimize fall risk.   Equipment: w/c and RW (RW for standing)   Reasons for discharge: lack of progress toward goals and discharge from hospital  Patient/family agrees with progress made and goals achieved: Yes  PT Discharge Precautions/Restrictions Precautions Precautions: Fall Precaution Comments: intellectual disability at baseline Restrictions Weight Bearing Restrictions: No Vital Signs Therapy Vitals Temp: 99.2 F (37.3 C) Temp Source: Oral Pulse Rate: 84 BP: 120/81 Patient Position (if appropriate): Lying Oxygen Therapy SpO2: 100 % O2 Device: Room Air Cognition Overall Cognitive Status: Impaired/Different from baseline Arousal/Alertness:  Awake/alert Orientation Level: Oriented to person Attention: Sustained Focused Attention: Appears intact Sustained Attention: Impaired Sustained Attention Impairment: Verbal basic;Functional basic Memory: Impaired Memory Impairment: Decreased recall of new information;Decreased short term memory Awareness Impairment: Intellectual impairment Problem Solving: Impaired Problem Solving Impairment: Verbal basic;Functional basic Safety/Judgment: Impaired Sensation Sensation Light Touch: Appears Intact Hot/Cold: Appears Intact Proprioception: Appears Intact Coordination Gross Motor Movements are Fluid and Coordinated: No Fine Motor Movements are Fluid and Coordinated: Yes Coordination and Movement Description: Generalized weakness present. Finger Nose Finger Test: [facility] Motor  Motor Motor: Motor apraxia Motor - Discharge Observations: generalized weakness  Mobility Bed Mobility Bed Mobility: Sit to Supine;Supine to Sit;Rolling Left;Rolling Right Rolling Right: Supervision/verbal cueing Rolling Left: Supervision/Verbal cueing Supine to Sit: Supervision/Verbal cueing Sit to Supine: Supervision/Verbal cueing Transfers Transfers: Sit to Stand;Stand to Sit;Squat Pivot Transfers Sit to Stand: Moderate Assistance - Patient 50-74% Stand to Sit: Moderate Assistance - Patient 50-74% Squat Pivot Transfers: Maximal Assistance - Patient 25-49% Transfer (Assistive device): 1 person hand held assist Locomotion  Gait Ambulation: No Gait Gait: No Stairs / Additional Locomotion Stairs: No Wheelchair Mobility Wheelchair Mobility: No  Trunk/Postural Assessment  Cervical Assessment Cervical Assessment: Within Functional Limits Thoracic Assessment Thoracic Assessment: Within Functional Limits Lumbar Assessment Lumbar Assessment: Exceptions to WFL(posterior pelvic tilt, increased flexion) Postural Control Postural Control: Deficits on evaluation(posterior lean bias)   Balance Balance Balance Assessed: Yes Static Sitting Balance Static Sitting - Balance Support: Feet unsupported;Bilateral upper extremity supported Static Sitting - Level of Assistance: 7: Independent Dynamic Sitting Balance Dynamic Sitting - Balance Support: Feet unsupported;No upper extremity supported Dynamic Sitting - Level of Assistance: 7: Independent Extremity Assessment  RLE Assessment RLE Assessment: Exceptions to WFL(Pt very resistant to any touch from therapist. RLE assessment performed via observation.) Passive Range of Motion (PROM) Comments: Appears globally

## 2019-05-07 NOTE — Progress Notes (Signed)
Speech Language Pathology Discharge Summary  Patient Details  Name: Rebecca Bruce MRN: [id number] Date of Birth: 30-Dec-1960  Today's Date: 05/07/2019 SLP Individual Time: 1345-1410 SLP Individual Time Calculation (min): 25 min   Skilled Therapeutic Interventions:  Skilled treatment session focused on cognitive goals. SLP facilitated session by providing Min A verbal cues for patient to sustain attention to a basic problem solving task. Mod-Max A verbal cues were needed for problem solving with task. Patient verbalized all wants/needs with overall supervision level verbal cues with 90% intelligibility. Patient left upright in bed with alarm on and all needs within reach. Continue with current plan of care.    Patient has met 4 of 4 long term goals.  Patient to discharge at overall Supervision level.   Reasons goals not met: N/A   Clinical Impression/Discharge Summary: Patient has made functional but slow gains and has met 4 of 4 STGs this reporting period. Currently, patient is consuming Dys. 2 textures with thin liquids with minimal overt s/s of aspiration and overall supervision level verbal cues for use of compensatory strategies. Patient requires overall supervision level verbal cues for auditory comprehension of basic information and to express her wants/needs at the phrase and sentence level. Patient and caregiver education is complete and patient will discharge with 24 hour supervision. Patient would benefit from f/u SLP services to maximize her swallowing and cognitive functioning in order to reduce caregiver burden.   Care Partner:  Caregiver Able to Provide Assistance: Yes  Type of Caregiver Assistance: Physical;Cognitive  Recommendation:  24 hour supervision/assistance;Home Health SLP  Rationale for SLP Follow Up: Maximize cognitive function and independence;Maximize functional communication;Maximize swallowing safety;Reduce caregiver burden   Equipment: N/A   Reasons for  discharge: Discharged from hospital;Treatment goals met        [place], [place] 05/07/2019, 6:30 AM

## 2019-05-07 NOTE — Progress Notes (Signed)
  Patient ID: Rebecca Bruce, female   DOB: 05-10-1960, 59 y.o.   MRN: [id number]    Diagnosis codes:  R53.81; U07.1;  J96.01  Height:  5'2"  Weight:   174 lbs    Patient suffers from debility following COVID-19 with respiratory failure which impairs their ability to perform daily activities like bathing, dressing and toileting in the home.  A high strength lightweight wheelchair will allow patient to safely perform daily activities.  This wheelchair will be used primarily in the home setting.  The patient is not able to propel themselves in the home using a standard or lightweight wheelchair due to arm weakness and endurance.  Patient can self propel in the high strength lightweight wheelchair.  I have personally performed a face to face evaluation of this patient and recommend this wheelchair.   Reesa Chew, PA-C

## 2019-05-08 DIAGNOSIS — N390 Urinary tract infection, site not specified: Secondary | ICD-10-CM

## 2019-05-08 LAB — GLUCOSE, CAPILLARY
Glucose-Capillary: 95 mg/dL (ref 70–99)
Glucose-Capillary: 101 mg/dL — ABNORMAL HIGH (ref 70–99)

## 2019-05-08 MED ORDER — SACCHAROMYCES BOULARDII 250 MG PO CAPS: 250.0000 mg | ORAL_CAPSULE | Freq: Every day | ORAL | 0 refills | Status: DC

## 2019-05-08 MED ORDER — GABAPENTIN 300 MG PO CAPS: 300.0000 mg | ORAL_CAPSULE | Freq: Every day | ORAL | Status: DC

## 2019-05-08 MED ORDER — ATORVASTATIN CALCIUM 20 MG PO TABS: 20.0000 mg | ORAL_TABLET | Freq: Every day | ORAL | 0 refills | Status: DC

## 2019-05-08 MED ORDER — HYDROCERIN EX CREA: 1.0000 "application " | TOPICAL_CREAM | Freq: Every day | CUTANEOUS | 0 refills | Status: DC

## 2019-05-08 MED ORDER — POTASSIUM CHLORIDE 20 MEQ PO PACK: 20.0000 meq | PACK | Freq: Two times a day (BID) | ORAL | 0 refills | Status: DC

## 2019-05-08 MED ORDER — SULFAMETHOXAZOLE-TRIMETHOPRIM 800-160 MG PO TABS: 1.0000 | ORAL_TABLET | Freq: Two times a day (BID) | ORAL | 0 refills | Status: DC

## 2019-05-08 MED ORDER — IPRATROPIUM-ALBUTEROL 20-100 MCG/ACT IN AERS: 1.0000 | INHALATION_SPRAY | Freq: Four times a day (QID) | RESPIRATORY_TRACT | 0 refills | Status: DC | PRN

## 2019-05-08 NOTE — [place] FL2 (Signed)
[place] MEDICAID FL2 LEVEL OF CARE SCREENING TOOL     IDENTIFICATION  Patient Name: Rebecca Bruce Birthdate: 07-Aug-1960 Sex: female Admission Date (Current Location): 04/21/2019  [place] and [name] Number:  [place] Bast [id number] K Facility and Address:  [place], [address], [place], [place] [address]      Provider Number: [id number]  Attending Physician Name and Address:  Ranelle Oyster, MD  Relative Name and Phone Number:       Current Level of Care: Other (Comment)(Acute Inpatient Rehab) Recommended Level of Care: Family Care Home Prior Approval Number:    Date Approved/Denied:   PASRR Number:    Discharge Plan: Domiciliary (Rest home)(Family Care Home)    Current Diagnoses: Patient Active Problem List   Diagnosis Date Noted  . UTI (urinary tract infection) 05/08/2019  . Transaminitis   . Prediabetes   . Dysphagia 04/24/2019  . Encephalopathy 04/21/2019  . FTT (failure to thrive) in adult 04/09/2019  . Acute blood loss anemia   . Hemorrhagic shock (HCC)   . Pressure injury of skin 03/28/2019  . Encounter for intubation   . Catatonia   . Acute respiratory failure with hypoxemia (HCC)   . COVID-19 virus detected   . Acute metabolic encephalopathy 03/11/2019  . Acute cystitis without hematuria   . COVID-19 virus infection   . Altered mental state 03/09/2019  . Sepsis secondary to UTI (HCC) 03/09/2019  . 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 Hyperglycemia 07/13/2018  . Pre-diabetes 01/05/2017  . Other symptoms and signs involving cognitive functions and awareness 09/01/2015  . Dyslipidemia 07/02/2015  . History of hyperthyroidism 07/02/2015  . History of radioactive iodine thyroid ablation 07/02/2015  . Hypothyroidism (acquired) 07/02/2015  . Obesity 07/02/2015  . Hypertension, benign 07/02/2015  . Anxiety 07/02/2015  . Vitamin D deficiency 07/02/2015  . Insomnia 07/02/2015  . OA (osteoarthritis) of knee  07/02/2015  . GERD (gastroesophageal reflux disease) 07/02/2015  . Hyperlipidemia, unspecified 07/02/2015    Orientation RESPIRATION BLADDER Height & Weight     Self  Normal Incontinent Weight: 173 lb 11.6 oz (78.8 kg) Height:     BEHAVIORAL SYMPTOMS/MOOD NEUROLOGICAL BOWEL NUTRITION STATUS      Incontinent    AMBULATORY STATUS COMMUNICATION OF NEEDS Skin   Limited Assist Verbally Normal                       Personal Care Assistance Level of Assistance  (needs briefs for incontinence) Bathing Assistance: Maximum assistance   Dressing Assistance: Limited assistance Total Care Assistance: Maximum assistance   Functional Limitations Info  Speech     Speech Info: Impaired    SPECIAL CARE FACTORS FREQUENCY  PT (By licensed PT), OT (By licensed OT), Speech therapy     PT Frequency: 2x/wk OT Frequency: 2x/wk     Speech Therapy Frequency: 2x/wk      Contractures Contractures Info: Not present    Additional Factors Info  Code Status, Psychotropic Code Status Info: full   Psychotropic Info: see MAR         Current Medications (05/08/2019):  This is the current hospital active medication list Current Facility-Administered Medications  Medication Dose Route Frequency Provider Last Rate Last Admin  . acetaminophen (TYLENOL) tablet 325-650 mg  325-650 mg Oral Q4H PRN Love, Pamela S, PA-C      . alum & mag hydroxide-simeth (MAALOX/MYLANTA) 200-200-20 MG/5ML suspension 30 mL  30 mL Oral Q4H PRN Love, Evlyn Kanner, PA-C      .  ascorbic acid (VITAMIN C) tablet 500 mg  500 mg Oral Daily Jacquelynn Cree, PA-C   500 mg at 05/08/19 4098  . atorvastatin (LIPITOR) tablet 20 mg  20 mg Oral q1800 Ranelle Oyster, MD   20 mg at 05/07/19 1727  . bisacodyl (DULCOLAX) suppository 10 mg  10 mg Rectal Daily PRN Love, Pamela S, PA-C      . diphenhydrAMINE (BENADRYL) 12.5 MG/5ML elixir 12.5-25 mg  12.5-25 mg Oral Q6H PRN Love, Pamela S, PA-C      . enoxaparin (LOVENOX) injection 40 mg  40  mg Subcutaneous Q24H Ranelle Oyster, MD   40 mg at 05/07/19 1405  . gabapentin (NEURONTIN) capsule 300 mg  300 mg Oral QHS Jacquelynn Cree, PA-C   300 mg at 05/07/19 2217  . guaiFENesin-dextromethorphan (ROBITUSSIN DM) 100-10 MG/5ML syrup 5-10 mL  5-10 mL Oral Q6H PRN Love, Evlyn Kanner, PA-C      . hydrocerin (EUCERIN) cream   Topical Daily Ranelle Oyster, MD   Given at 05/08/19 0915  . insulin aspart (novoLOG) injection 0-15 Units  0-15 Units Subcutaneous TID WC Jacquelynn Cree, PA-C   3 Units at 04/25/19 1836  . insulin aspart (novoLOG) injection 0-5 Units  0-5 Units Subcutaneous QHS Love, Pamela S, PA-C      . Ipratropium-Albuterol (COMBIVENT) respimat 1 puff  1 puff Inhalation Q6H PRN Love, Pamela S, PA-C      . levothyroxine (SYNTHROID) tablet 100 mcg  100 mcg Oral Q0600 Jacquelynn Cree, PA-C   100 mcg at 05/08/19 1191  . MEDLINE mouth rinse  15 mL Mouth Rinse q12n4p Jacquelynn Cree, PA-C   15 mL at 05/07/19 1247  . multivitamin with minerals tablet 1 tablet  1 tablet Oral Daily Jacquelynn Cree, PA-C   1 tablet at 05/08/19 0836  . PARoxetine (PAXIL) tablet 30 mg  30 mg Oral QHS Jacquelynn Cree, PA-C   30 mg at 05/07/19 2204  . polyethylene glycol (MIRALAX / GLYCOLAX) packet 17 g  17 g Oral Daily PRN Love, Pamela S, PA-C      . potassium chloride (KLOR-CON) packet 20 mEq  20 mEq Oral BID Ranelle Oyster, MD   20 mEq at 05/08/19 0910  . prochlorperazine (COMPAZINE) tablet 5-10 mg  5-10 mg Oral Q6H PRN Love, Pamela S, PA-C       Or  . prochlorperazine (COMPAZINE) injection 5-10 mg  5-10 mg Intramuscular Q6H PRN Love, Pamela S, PA-C       Or  . prochlorperazine (COMPAZINE) suppository 12.5 mg  12.5 mg Rectal Q6H PRN Love, Pamela S, PA-C      . saccharomyces boulardii (FLORASTOR) capsule 250 mg  250 mg Oral Daily Ranelle Oyster, MD   250 mg at 05/08/19 0908  . sodium phosphate (FLEET) 7-19 GM/118ML enema 1 enema  1 enema Rectal Once PRN Love, Evlyn Kanner, PA-C      . sulfamethoxazole-trimethoprim  (BACTRIM DS) 800-160 MG per tablet 1 tablet  1 tablet Oral Q12H Ranelle Oyster, MD   1 tablet at 05/08/19 0910  . traZODone (DESYREL) tablet 25-50 mg  25-50 mg Oral QHS PRN Jacquelynn Cree, PA-C   50 mg at 05/05/19 2158  . white petrolatum (VASELINE) gel 1 application  1 application Topical PRN Love, Pamela S, PA-C      . zinc sulfate capsule 220 mg  220 mg Oral Daily Jacquelynn Cree, PA-C   220 mg at 05/08/19 [phone]  Discharge Medications: Please see discharge summary for a list of discharge medications.  Relevant Imaging Results:  Relevant Lab Results:   Additional Information    Julliana Whitmyer, LCSW

## 2019-05-08 NOTE — Progress Notes (Signed)
[facility] PHYSICAL MEDICINE & REHABILITATION PROGRESS NOTE   Subjective/Complaints: No new problems. Awake and alert in bed  Appears calm and comfortable this morning but was agitated and screaming overnight during cathing, was cathed for 350 and did not void overnight. Denies pain, constipation. Says she is sleeping well at night.   ROS: Limited due to cognitive/behavioral   Objective:   No results found. Recent Labs    05/07/19 1019  WBC 9.2  HGB 10.9*  HCT 34.3*  PLT 355   Recent Labs    05/07/19 1019  NA 137  K 4.1  CL 104  CO2 25  GLUCOSE 114*  BUN 13  CREATININE 0.99  CALCIUM 8.9    Intake/Output Summary (Last 24 hours) at 05/08/2019 0857 Last data filed at 05/08/2019 0022 Gross per 24 hour  Intake 480 ml  Output 850 ml  Net -370 ml     Physical Exam: Vital Signs Blood pressure 117/67, pulse 78, temperature 99 F (37.2 C), temperature source Oral, resp. rate 18, weight 78.8 kg, SpO2 100 %. Constitutional: No distress . Vital signs reviewed. HEENT: EOMI, oral membranes moist Neck: supple Cardiovascular: RRR without murmur. No JVD    Respiratory: CTA Bilaterally without wheezes or rales. Normal effort    GI: BS +, non-tender, non-distended  Skin: Warm and dry.  Intact. Psych: pleasant Musc: No edema in extremities.  No tenderness in extremities. Neurologic: alert, minimal insight and awareness. Follows basic commands Motor: Bilateral upper extremities: Grossly 4/5 proximal distal--stable exam   Assessment/Plan: 1. Functional deficits secondary to covid encephalopathy which require 3+ hours per day of interdisciplinary therapy in a comprehensive inpatient rehab setting.  Physiatrist is providing close team supervision and 24 hour management of active medical problems listed below.  Physiatrist and rehab team continue to assess barriers to discharge/monitor patient progress toward functional and medical goals  Care Tool:  Bathing    Body parts  bathed by patient: Chest, Abdomen, Face, Right arm, Left arm, Front perineal area, Right upper leg, Left upper leg   Body parts bathed by helper: Right lower leg, Left lower leg, Buttocks     Bathing assist Assist Level: Moderate Assistance - Patient 50 - 74%     Upper Body Dressing/Undressing Upper body dressing   What is the patient wearing?: Pull over shirt    Upper body assist Assist Level: Moderate Assistance - Patient 50 - 74%    Lower Body Dressing/Undressing Lower body dressing    Lower body dressing activity did not occur: Refused What is the patient wearing?: Pants     Lower body assist Assist for lower body dressing: Maximal Assistance - Patient 25 - 49%     Toileting Toileting Toileting Activity did not occur (Clothing management and hygiene only): Refused  Toileting assist Assist for toileting: Moderate Assistance - Patient 50 - 74%     Transfers Chair/bed transfer  Transfers assist  Chair/bed transfer activity did not occur: Refused  Chair/bed transfer assist level: Maximal Assistance - Patient 25 - 49%     Locomotion Ambulation   Ambulation assist   Ambulation activity did not occur: Refused          Walk 10 feet activity   Assist  Walk 10 feet activity did not occur: Refused        Walk 50 feet activity   Assist Walk 50 feet with 2 turns activity did not occur: Refused         Walk 150 feet activity  Assist Walk 150 feet activity did not occur: Refused         Walk 10 feet on uneven surface  activity   Assist Walk 10 feet on uneven surfaces activity did not occur: Armed forces operational officer activity did not occur: Refused         Wheelchair 50 feet with 2 turns activity    Assist    Wheelchair 50 feet with 2 turns activity did not occur: Refused       Wheelchair 150 feet activity     Assist  Wheelchair 150 feet activity did not occur: Refused        Blood pressure 117/67, pulse 78, temperature 99 F (37.2 C), temperature source Oral, resp. rate 18, weight 78.8 kg, SpO2 100 %.  Medical Problem List and Plan: 1. Debility and encephalopathy secondary to Covid 19  Continue CIR as possible  -return to [facility] tomorrow, finalize dc planning  2. Antithrombotics: -DVT/anticoagulation:Mechanical:Sequential compression devices, below kneeBilateral lower extremities.   -continue lovenox 40mg  daily -antiplatelet therapy: N/A 3. Pain Management:Was on Gabapentin 300 mg/hs PTA. Tylenol prn for now. 4. Mood:  consistent schedule/familiar faces to help with security/compliance  -suspect cognition is at baseline -antipsychotic agents: N/A 5. Neuropsych: This patientis not capable of making decisions on herown behalf.  6. Skin/Wound Care:Routine pressure relief measure.  -  eucerin cream for dry feet 7. Fluids/Electrolytes/Nutrition:encourage PO, monitor for oral pocketing.   Hypokalemia-potassium 4.1 on 1/26 8. Multifactorial encephalopathy post Covid: Catatonia has resolved.   - agitation better  -cognition likely close to baseline 9. ABLA: Has been transfused with 4 units PRBC.  Continue to monitor with serial checks.   Hemoglobin 11.2 on 1/21. 10.9 on 1/26    10. Malnutrition: Intake limited to certain foods--offer supplements thorough out the day.   1/11 -added megace as appetite stimulant, haven't seen a lot of progress however  1/18 P.o. intake poor to inconsistent   -off megace as it may be contributing to loose stool, incontinence   1/26 improved 11. Multilevel Cervical stenosis: Follow up with Dr. Annette Stable after discharge.  12. H/o major depressive disorder: home paxil resumed   13. Prediabetes:   Relatively controlled on 1/20, 1/25, 1/27.  14. Elevated LFT's: trending down 1/18 15. Persistent leukocytosis: 11/1 16.1  WBCs 14.2 on 1/14   Afebrile   1/21 15.7   1/24 100K Citrobacter in urine:  abx  changed to bactrim yesterday---continue for seven day course  9.2 on 1/26. 16. Dysphagia: D2 thin diet  -advance per SLP   LOS: 17 days A FACE TO FACE EVALUATION WAS PERFORMED  Clide Deutscher Chardonnay Holzmann 05/08/2019, 8:57 AM

## 2019-05-08 NOTE — Discharge Instructions (Signed)
Inpatient Rehab Discharge Instructions  Rebecca Bruce Discharge date and time: 05/07/18   Activities/Precautions/ Functional Status: Activity: activity as tolerated Diet: dyphagia 2, thin liquids Wound Care: none needed   Functional status:  ___ No restrictions     ___ Walk up steps independently _X__ 24/7 supervision/assistance   ___ Walk up steps with assistance ___ Intermittent supervision/assistance  ___ Bathe/dress independently ___ Walk with walker     ___ Bathe/dress with assistance ___ Walk Independently    ___ Shower independently ___ Walk with assistance    ___ Shower with assistance ___ No alcohol     ___ Return to work/school ________  Special Instructions: 1. Encourage patient to urinate every 5-6 hours.    COMMUNITY REFERRALS UPON DISCHARGE:    Home Health:   PT     OT     ST                     Agency:  [facility]   Phone: [phone]    Medical Equipment/Items Ordered: wheelchair, cushion, walker, 3n1 commode                                                     Agency/Supplier:  [facility]    My questions have been answered and I understand these instructions. I will adhere to these goals and the provided educational materials after my discharge from the hospital.  Patient/Caregiver Signature _______________________________ Date __________  Clinician Signature _______________________________________ Date __________  Please bring this form and your medication list with you to all your follow-up doctor's appointments.

## 2019-05-08 NOTE — Plan of Care (Signed)
  Problem: Consults Goal: RH GENERAL PATIENT EDUCATION Description: See Patient Education module for education specifics. Outcome: Completed/Met Goal: Skin Care Protocol Initiated - if Braden Score 18 or less Description: If consults are not indicated, leave blank or document N/A Outcome: Completed/Met Goal: Nutrition Consult-if indicated Outcome: Completed/Met Goal: Diabetes Guidelines if Diabetic/Glucose > 140 Description: If diabetic or lab glucose is > 140 mg/dl - Initiate Diabetes/Hyperglycemia Guidelines & Document Interventions  Outcome: Completed/Met   Problem: RH BOWEL ELIMINATION Goal: RH STG MANAGE BOWEL WITH ASSISTANCE Description: STG Manage Bowel with  modified Assistance. Outcome: Completed/Met Goal: RH STG MANAGE BOWEL W/MEDICATION W/ASSISTANCE Description: STG Manage Bowel with Medication with  modified Assistance. Outcome: Completed/Met   Problem: RH BLADDER ELIMINATION Goal: RH STG MANAGE BLADDER WITH ASSISTANCE Description: STG Manage Bladder With modified Assistance Outcome: Completed/Met Goal: RH STG MANAGE BLADDER WITH MEDICATION WITH ASSISTANCE Description: STG Manage Bladder With Medication With  modified Assistance. Outcome: Completed/Met Goal: RH STG MANAGE BLADDER WITH EQUIPMENT WITH ASSISTANCE Description: STG Manage Bladder With Equipment With modifiedAssistance Outcome: Completed/Met   Problem: RH SKIN INTEGRITY Goal: RH STG SKIN FREE OF INFECTION/BREAKDOWN Description: Skin free from infection entire stay on rehab Outcome: Completed/Met Goal: RH STG MAINTAIN SKIN INTEGRITY WITH ASSISTANCE Description: STG Maintain Skin Integrity With  max Assistance. Outcome: Completed/Met Goal: RH STG ABLE TO PERFORM INCISION/WOUND CARE W/ASSISTANCE Description: STG Able To Perform Incision/Wound Care With max Assistance. Outcome: Completed/Met   Problem: RH SAFETY Goal: RH STG ADHERE TO SAFETY PRECAUTIONS W/ASSISTANCE/DEVICE Description: STG Adhere to  Safety Precautions With modified Assistance/Device. Outcome: Completed/Met Goal: RH STG DECREASED RISK OF FALL WITH ASSISTANCE Description: STG Decreased Risk of Fall With modified  Assistance. Outcome: Completed/Met   Problem: RH PAIN MANAGEMENT Goal: RH STG PAIN MANAGED AT OR BELOW PT'S PAIN GOAL Outcome: Completed/Met   Problem: RH KNOWLEDGE DEFICIT GENERAL Goal: RH STG INCREASE KNOWLEDGE OF SELF CARE AFTER HOSPITALIZATION Description: Mod I Outcome: Completed/Met

## 2019-05-08 NOTE — Progress Notes (Signed)
I/O cath preformed right before discharge, with 742ml of urine return. Patient discarged back to group home, discharge papers sent with employees from group home. Adria Devon, LPN

## 2019-05-08 NOTE — Progress Notes (Signed)
Patient bladder scan volume at 0030 was 150. No void for 8 hours after midnight and was cath for total urine output of 350 cc. Patient screams and agitated throughout the process, but was able to calm down after.

## 2019-05-09 ENCOUNTER — Telehealth: Payer: Self-pay

## 2019-05-09 DIAGNOSIS — F419 Anxiety disorder, unspecified: Secondary | ICD-10-CM | POA: Diagnosis not present

## 2019-05-09 DIAGNOSIS — I10 Essential (primary) hypertension: Secondary | ICD-10-CM | POA: Diagnosis not present

## 2019-05-09 DIAGNOSIS — R7303 Prediabetes: Secondary | ICD-10-CM | POA: Diagnosis not present

## 2019-05-09 DIAGNOSIS — F329 Major depressive disorder, single episode, unspecified: Secondary | ICD-10-CM | POA: Diagnosis not present

## 2019-05-09 DIAGNOSIS — D72829 Elevated white blood cell count, unspecified: Secondary | ICD-10-CM | POA: Diagnosis not present

## 2019-05-09 DIAGNOSIS — G9349 Other encephalopathy: Secondary | ICD-10-CM | POA: Diagnosis not present

## 2019-05-09 DIAGNOSIS — F79 Unspecified intellectual disabilities: Secondary | ICD-10-CM | POA: Diagnosis not present

## 2019-05-09 DIAGNOSIS — R1312 Dysphagia, oropharyngeal phase: Secondary | ICD-10-CM | POA: Diagnosis not present

## 2019-05-09 DIAGNOSIS — M4802 Spinal stenosis, cervical region: Secondary | ICD-10-CM | POA: Diagnosis not present

## 2019-05-09 DIAGNOSIS — M6281 Muscle weakness (generalized): Secondary | ICD-10-CM | POA: Diagnosis not present

## 2019-05-09 DIAGNOSIS — K219 Gastro-esophageal reflux disease without esophagitis: Secondary | ICD-10-CM | POA: Diagnosis not present

## 2019-05-09 DIAGNOSIS — Z8616 Personal history of COVID-19: Secondary | ICD-10-CM | POA: Diagnosis not present

## 2019-05-09 NOTE — Telephone Encounter (Signed)
Transition Care Management Follow-up Telephone Call  Date of discharge and from where: 05/08/19 Rebecca Bruce   How have you been since you were released from the hospital? Spoke to representative Mikle Bosworth Day at group home, pt is doing okay but states difficulty when trying to give pt a shower or change her.   Any questions or concerns? Yes representative from group home states she is reluctant to get up out of her chair or out of bed due to possible neuropathy per nurse and patient states fear of falling. [facility] scheduled for PT, OT and ST.   Items Reviewed:  Did the pt receive and understand the discharge instructions provided? Yes   Medications obtained and verified? Yes   Any new allergies since your discharge? No   Dietary orders reviewed? Yes  Do you have support at home? Yes  - lives in group home  Functional Questionnaire: (I = Independent and D = Dependent) ADLs: D  Bathing/Dressing- D  Meal Prep- D  Eating- I  Maintaining continence- D  Transferring/Ambulation- D  Managing Meds- D  Follow up appointments reviewed:   PCP Hospital f/u appt confirmed? Yes  Scheduled to see Dr. Ancil Boozer on  05/14/19 @ 9:40. Virtual video call scheduled.   Are transportation arrangements needed? No   If their condition worsens, is the pt aware to call PCP or go to the Emergency Dept.? Yes  Was the patient provided with contact information for the PCP's office or ED? Yes  Was to pt encouraged to call back with questions or concerns? Yes

## 2019-05-09 NOTE — Progress Notes (Signed)
Social Work Discharge Note   The overall goal for the admission was met for:   Discharge location: Yes - pt returned to group home  Length of Stay: Yes - 17 days  Discharge activity level: Yes - min/ mod assist overall  Home/community participation: Yes  Services provided included: MD, RD, PT, OT, SLP, RN, TR, Pharmacy and [place]: Medicare and Medicaid  Follow-up services arranged: Home Health: Pt, OT, ST via [facility] and DME: 18x18 lightweight w/c, cushion, rolling walker, 3n1 commode via [place] (or additional information):  Contact:  D.O.N. of group home, Gena Fray @ [phone]  Patient/Family verbalized understanding of follow-up arrangements: Yes  Individual responsible for coordination of the follow-up plan: D.O.N. and pt's brother  Confirmed correct DME delivered: Lennart Pall 05/09/2019    Jeneen Doutt

## 2019-05-13 DIAGNOSIS — Z8616 Personal history of COVID-19: Secondary | ICD-10-CM | POA: Diagnosis not present

## 2019-05-13 DIAGNOSIS — D72829 Elevated white blood cell count, unspecified: Secondary | ICD-10-CM | POA: Diagnosis not present

## 2019-05-13 DIAGNOSIS — R1312 Dysphagia, oropharyngeal phase: Secondary | ICD-10-CM | POA: Diagnosis not present

## 2019-05-13 DIAGNOSIS — M6281 Muscle weakness (generalized): Secondary | ICD-10-CM | POA: Diagnosis not present

## 2019-05-13 DIAGNOSIS — F79 Unspecified intellectual disabilities: Secondary | ICD-10-CM | POA: Diagnosis not present

## 2019-05-13 DIAGNOSIS — G9349 Other encephalopathy: Secondary | ICD-10-CM | POA: Diagnosis not present

## 2019-05-14 ENCOUNTER — Encounter: Payer: Self-pay | Admitting: Family Medicine

## 2019-05-14 ENCOUNTER — Telehealth: Payer: Self-pay

## 2019-05-14 ENCOUNTER — Ambulatory Visit (INDEPENDENT_AMBULATORY_CARE_PROVIDER_SITE_OTHER): Payer: Medicare Other | Admitting: Family Medicine

## 2019-05-14 DIAGNOSIS — Z09 Encounter for follow-up examination after completed treatment for conditions other than malignant neoplasm: Secondary | ICD-10-CM | POA: Diagnosis not present

## 2019-05-14 DIAGNOSIS — I700 Atherosclerosis of aorta: Secondary | ICD-10-CM

## 2019-05-14 DIAGNOSIS — E039 Hypothyroidism, unspecified: Secondary | ICD-10-CM | POA: Diagnosis not present

## 2019-05-14 DIAGNOSIS — N3000 Acute cystitis without hematuria: Secondary | ICD-10-CM

## 2019-05-14 DIAGNOSIS — D473 Essential (hemorrhagic) thrombocythemia: Secondary | ICD-10-CM | POA: Diagnosis not present

## 2019-05-14 DIAGNOSIS — R404 Transient alteration of awareness: Secondary | ICD-10-CM | POA: Diagnosis not present

## 2019-05-14 DIAGNOSIS — D75839 Thrombocytosis, unspecified: Secondary | ICD-10-CM

## 2019-05-14 NOTE — Telephone Encounter (Signed)
Copied from [place] [id number]. Topic: General - Inquiry >> May 14, 2019 12:40 PM Percell Belt A wrote: Reason for CRM: Shay long from [employer] , she stated they pt forgot to ask Dr if was a good idea for pt to get Coid shot?   Best number [phone]

## 2019-05-14 NOTE — Progress Notes (Signed)
Name: Rebecca Bruce   MRN: [id number]    DOB: 09-25-1960   Date:05/14/2019       Progress Note  Subjective  Chief Complaint  Chief Complaint  Patient presents with  . Hospitalization Follow-up    I connected with  Rebecca Bruce  on 05/14/19 at  9:40 AM EST by a video enabled telemedicine application and verified that I am speaking with the correct person using two identifiers.  I discussed the limitations of evaluation and management by telemedicine and the availability of in person appointments. The patient expressed understanding and agreed to proceed. Staff also discussed with the patient that there may be a patient responsible charge related to this service. Patient Location: at group home  Provider Location: [facility] Additional Individuals present: Rebecca Bruce - caregiver   HPI  Hospital Discharge Follow up: Rebecca Bruce was taken by group home to [facility] on 03/08/2019 with urinary incontinence and agitation. She had CT brain that was negative, and was send home with antibiotics to treat an UTI, however she got worse at home with worsening of agitation, and delirium with some visual hallucinations, she was taken back to [facility] and admitted on 03/09/2019  . At the [facility] she was very agitated, CT head was repeated and still negative and had ua suggestive of UTI. She was tachycardic and bp was very high on admission at 159/110 , heart rate was high and was admitted for possible sepsis and started on vacomycin and meropenem. She was found to have Bilateral patchy pulmonary infiltrates and COVID-19 test was positive. She was transferred from [facility] to [facility] for COVID pneumonia treatment During her stay she remained unresponside and had body stiffness , she had EEG that showed diffuse encephalopathy but MRI brain was negative for acute changes, she was given antibiotics for possible meningitis . She was given steroids and Remdesevir She developed anemia and  was found of having spontaneous hemoperitoneum of unknown etiology and required 4 units of PRBC, visceral angiogram showed diffuse covid vasculitis. She was given speech therapy. She was placed at rehab area on 04/20/2018 and was found to have another UTI and treated with Septra. She continued to have mood changes and refused to use wheelchair or gait training. She was sent back to group home on 05/08/2019. Rebecca Bruce ( her caregiver) states she was not her normal self when she first arrived to group home. She was still having periods of agitation and screaming for no reason, but she recognize everyone, has been eating and for the past two days her mood has been back to her baseline. She is getting up with assistance but not walking. She is getting some home health PT. Patient was cooperative during our video chat  Reviewed mediation list and also records with caregiver.    Patient Active Problem List   Diagnosis Date Noted  . Transaminitis   . Pressure injury of skin 03/28/2019  . History of COVID-19   . 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 Hyperglycemia 07/13/2018  . Pre-diabetes 01/05/2017  . Dyslipidemia 07/02/2015  . History of hyperthyroidism 07/02/2015  . History of radioactive iodine thyroid ablation 07/02/2015  . Hypothyroidism (acquired) 07/02/2015  . Hypertension, benign 07/02/2015  . Anxiety 07/02/2015  . Vitamin D deficiency 07/02/2015  . Insomnia 07/02/2015  . OA (osteoarthritis) of knee 07/02/2015  . GERD (gastroesophageal reflux disease) 07/02/2015    Past Surgical History:  Procedure Laterality Date  . DENTAL SURGERY    . IR ANGIOGRAM  SELECTIVE EACH ADDITIONAL VESSEL  03/29/2019  . IR ANGIOGRAM SELECTIVE EACH ADDITIONAL VESSEL  03/29/2019  . IR ANGIOGRAM SELECTIVE EACH ADDITIONAL VESSEL  03/29/2019  . IR ANGIOGRAM SELECTIVE EACH ADDITIONAL VESSEL  03/29/2019  . IR ANGIOGRAM VISCERAL SELECTIVE  03/29/2019  . IR ANGIOGRAM VISCERAL SELECTIVE  03/29/2019  . IR US  GUIDE VASC ACCESS RIGHT  03/29/2019    Family History  Problem Relation Age of Onset  . Heart disease Mother   . Breast cancer Neg Hx     Current Outpatient Medications:  .  acetaminophen (TYLENOL) 325 MG tablet, Take 1-2 tablets (325-650 mg total) by mouth every 4 (four) hours as needed for mild pain., Disp:  , Rfl:  .  ascorbic acid (VITAMIN C) 500 MG/5ML syrup, Take 5 mLs (500 mg total) by mouth daily., Disp: 473 mL, Rfl: 12 .  atorvastatin (LIPITOR) 20 MG tablet, Take 1 tablet (20 mg total) by mouth daily at 6 PM., Disp: 30 tablet, Rfl: 0 .  gabapentin (NEURONTIN) 300 MG capsule, Take 1 capsule (300 mg total) by mouth at bedtime., Disp: , Rfl:  .  hydrocerin (EUCERIN) CREA, Apply 1 application topically daily. To bilateral feet, Disp: , Rfl: 0 .  Ipratropium-Albuterol (COMBIVENT) 20-100 MCG/ACT AERS respimat, Inhale 1 puff into the lungs every 6 (six) hours as needed for wheezing., Disp: 4 g, Rfl: 0 .  levothyroxine (SYNTHROID) 100 MCG tablet, Take 1 tablet (100 mcg total) by mouth daily before breakfast., Disp: 30 tablet, Rfl: 2 .  LORazepam (ATIVAN) 0.5 MG tablet, Take 0.5 mg by mouth daily as needed for anxiety. Can take an extra pill 30 minutes after the first dose if needed, Disp: , Rfl:  .  Multiple Vitamin (MULTIVITAMIN WITH MINERALS) TABS tablet, Take 1 tablet by mouth daily., Disp:  , Rfl:  .  PARoxetine (PAXIL) 30 MG tablet, Take 1 tablet (30 mg total) by mouth at bedtime., Disp: 30 tablet, Rfl: 2 .  potassium chloride (KLOR-CON) 20 MEQ packet, Take 20 mEq by mouth 2 (two) times daily., Disp: 60 packet, Rfl: 0 .  saccharomyces boulardii (FLORASTOR) 250 MG capsule, Take 1 capsule (250 mg total) by mouth daily., Disp: 30 capsule, Rfl: 0 .  sulfamethoxazole-trimethoprim (BACTRIM DS) 800-160 MG tablet, Take 1 tablet by mouth every 12 (twelve) hours., Disp: 7 tablet, Rfl: 0 .  VIACTIV CALCIUM PLUS D 650-12.5-40 MG-MCG CHEW, CHEW AND SWALLOW ONE PIECE TWICE DAILY. (SUPPLEMENT) CARAMEL  (Patient taking differently: Chew 1 Dose by mouth 2 (two) times daily. ), Disp: 60 tablet, Rfl: 12 .  zinc sulfate 220 (50 Zn) MG capsule, Take 1 capsule (220 mg total) by mouth daily., Disp:  , Rfl:   No Known Allergies  I personally reviewed active problem list, medication list, allergies, family history, social history, health maintenance with the patient/caregiver today.   ROS  Ten systems reviewed and is negative except as mentioned in HPI   Objective  Virtual encounter, vitals not obtained.  There is no height or weight on file to calculate BMI.  Physical Exam  Awake, alert and cooperative, smiling   PHQ2/9: Depression screen [facility] 2/9 01/30/2019 09/25/2018 08/03/2018 07/11/2017 03/13/2017  Decreased Interest 0 0 0 0 0  Down, Depressed, Hopeless 0 0 0 0 0  PHQ - 2 Score 0 0 0 0 0  Altered sleeping 0 0 0 0 -  Tired, decreased energy 0 0 0 0 -  Change in appetite 0 0 0 0 -  Feeling bad or failure about  yourself  0 0 0 0 -  Trouble concentrating 0 0 0 0 -  Moving slowly or fidgety/restless 0 0 0 0 -  Suicidal thoughts 0 0 0 0 -  PHQ-9 Score 0 0 0 0 -  Difficult doing work/chores - Not difficult at all Not difficult at all Not difficult at all -   PHQ-2/9 Result is unable to check .    Fall Risk: Fall Risk  05/14/2019 08/03/2018 07/13/2018 03/16/2018 11/13/2017  Falls in the past year? 0 0 0 0 No  Number falls in past yr: 0 0 0 - -  Injury with Fall? 0 0 0 - -  Risk for fall due to : - - - - -  Risk for fall due to: Comment - - - - -  Follow up - Falls evaluation completed - - -     Assessment & Plan  1. Hospital discharge follow-up  She has been back to the group home since last Wednesday - CBC with Differential/Platelet; Future - Comprehensive metabolic panel; Future - TSH; Future - Urine Culture; Future  2. Atherosclerosis of aorta ([place])  Discussed results   3. Transient alteration of awareness  Doing well now   4. Thrombocytosis (HCC)  - CBC with  Differential/Platelet; Future  5. Hypothyroidism (acquired)  - TSH; Future  6. Acute cystitis without hematuria  - Urine Culture; Future  I discussed the assessment and treatment plan with the patient. The patient was provided an opportunity to ask questions and all were answered. The patient agreed with the plan and demonstrated an understanding of the instructions.  The patient was advised to call back or seek an in-person evaluation if the symptoms worsen or if the condition fails to improve as anticipated.  I provided 25 minutes of non-face-to-face time during this encounter.

## 2019-05-15 ENCOUNTER — Telehealth: Payer: Self-pay | Admitting: Family Medicine

## 2019-05-15 ENCOUNTER — Other Ambulatory Visit
Admission: RE | Admit: 2019-05-15 | Discharge: 2019-05-15 | Disposition: A | Discharge status: Home / Self Care | Payer: Medicare Other | Source: Ambulatory Visit | Attending: Family Medicine | Admitting: Family Medicine

## 2019-05-15 ENCOUNTER — Encounter: Payer: Self-pay | Admitting: Family Medicine

## 2019-05-15 DIAGNOSIS — Z8616 Personal history of COVID-19: Secondary | ICD-10-CM | POA: Diagnosis not present

## 2019-05-15 DIAGNOSIS — R1312 Dysphagia, oropharyngeal phase: Secondary | ICD-10-CM | POA: Diagnosis not present

## 2019-05-15 DIAGNOSIS — D75839 Thrombocytosis, unspecified: Secondary | ICD-10-CM

## 2019-05-15 DIAGNOSIS — Z09 Encounter for follow-up examination after completed treatment for conditions other than malignant neoplasm: Secondary | ICD-10-CM | POA: Insufficient documentation

## 2019-05-15 DIAGNOSIS — M6281 Muscle weakness (generalized): Secondary | ICD-10-CM | POA: Diagnosis not present

## 2019-05-15 DIAGNOSIS — D473 Essential (hemorrhagic) thrombocythemia: Secondary | ICD-10-CM | POA: Insufficient documentation

## 2019-05-15 DIAGNOSIS — E039 Hypothyroidism, unspecified: Secondary | ICD-10-CM | POA: Insufficient documentation

## 2019-05-15 DIAGNOSIS — G9349 Other encephalopathy: Secondary | ICD-10-CM | POA: Diagnosis not present

## 2019-05-15 DIAGNOSIS — F79 Unspecified intellectual disabilities: Secondary | ICD-10-CM | POA: Diagnosis not present

## 2019-05-15 DIAGNOSIS — D72829 Elevated white blood cell count, unspecified: Secondary | ICD-10-CM | POA: Diagnosis not present

## 2019-05-15 LAB — COMPREHENSIVE METABOLIC PANEL
ALT: 21 U/L (ref 0–44)
AST: 19 U/L (ref 15–41)
Albumin: 3.1 g/dL — ABNORMAL LOW (ref 3.5–5.0)
Alkaline Phosphatase: 73 U/L (ref 38–126)
Anion gap: 10 (ref 5–15)
BUN: 9 mg/dL (ref 6–20)
CO2: 22 mmol/L (ref 22–32)
Calcium: 9.2 mg/dL (ref 8.9–10.3)
Chloride: 107 mmol/L (ref 98–111)
Creatinine, Ser: 0.84 mg/dL (ref 0.44–1.00)
GFR calc Af Amer: >60 mL/min (ref >60)
GFR calc non Af Amer: >60 mL/min (ref >60)
Glucose, Bld: 101 mg/dL — ABNORMAL HIGH (ref 70–99)
Potassium: 3.7 mmol/L (ref 3.5–5.1)
Sodium: 139 mmol/L (ref 135–145)
Total Bilirubin: 0.4 mg/dL (ref 0.3–1.2)
Total Protein: 7.4 g/dL (ref 6.5–8.1)

## 2019-05-15 LAB — CBC WITH DIFFERENTIAL/PLATELET
Abs Immature Granulocytes: 0.04 10*3/uL (ref 0.00–0.07)
Basophils Absolute: 0.1 10*3/uL (ref 0.0–0.1)
Basophils Relative: 1 %
Eosinophils Absolute: 0.3 10*3/uL (ref 0.0–0.5)
Eosinophils Relative: 3 %
HCT: 35.9 % — ABNORMAL LOW (ref 36.0–46.0)
Hemoglobin: 11.2 g/dL — ABNORMAL LOW (ref 12.0–15.0)
Immature Granulocytes: 0 %
Lymphocytes Relative: 17 %
Lymphs Abs: 1.8 10*3/uL (ref 0.7–4.0)
MCH: 28.1 pg (ref 26.0–34.0)
MCHC: 31.2 g/dL (ref 30.0–36.0)
MCV: 90.2 fL (ref 80.0–100.0)
Monocytes Absolute: 0.9 10*3/uL (ref 0.1–1.0)
Monocytes Relative: 8 %
Neutro Abs: 7.4 10*3/uL (ref 1.7–7.7)
Neutrophils Relative %: 71 %
Platelets: 318 10*3/uL (ref 150–400)
RBC: 3.98 MIL/uL (ref 3.87–5.11)
RDW: 16.9 % — ABNORMAL HIGH (ref 11.5–15.5)
WBC: 10.6 10*3/uL — ABNORMAL HIGH (ref 4.0–10.5)
nRBC: 0 % (ref 0.0–0.2)

## 2019-05-15 LAB — TSH: TSH: 34.294 u[IU]/mL — ABNORMAL HIGH (ref 0.350–4.500)

## 2019-05-15 NOTE — Telephone Encounter (Signed)
Home Health Verbal Orders - Caller/Agency: Lisa/ [facility] Callback Number: [phone] Requesting OT/PT/Skilled Nursing/Social Work/Speech Therapy: OT Frequency: 2x 3 weeks

## 2019-05-15 NOTE — Telephone Encounter (Signed)
Letter written and fax to Elouise Munroe at [employer].

## 2019-05-15 NOTE — Telephone Encounter (Signed)
Rebecca Bruce called again checking on status of recommendation for patient to receive shot. Please advise.

## 2019-05-16 DIAGNOSIS — F79 Unspecified intellectual disabilities: Secondary | ICD-10-CM | POA: Diagnosis not present

## 2019-05-16 DIAGNOSIS — Z8616 Personal history of COVID-19: Secondary | ICD-10-CM | POA: Diagnosis not present

## 2019-05-16 DIAGNOSIS — M6281 Muscle weakness (generalized): Secondary | ICD-10-CM | POA: Diagnosis not present

## 2019-05-16 DIAGNOSIS — R1312 Dysphagia, oropharyngeal phase: Secondary | ICD-10-CM | POA: Diagnosis not present

## 2019-05-16 DIAGNOSIS — D72829 Elevated white blood cell count, unspecified: Secondary | ICD-10-CM | POA: Diagnosis not present

## 2019-05-16 DIAGNOSIS — G9349 Other encephalopathy: Secondary | ICD-10-CM | POA: Diagnosis not present

## 2019-05-17 ENCOUNTER — Other Ambulatory Visit: Payer: Self-pay

## 2019-05-17 DIAGNOSIS — Z09 Encounter for follow-up examination after completed treatment for conditions other than malignant neoplasm: Secondary | ICD-10-CM | POA: Diagnosis not present

## 2019-05-17 DIAGNOSIS — G9349 Other encephalopathy: Secondary | ICD-10-CM | POA: Diagnosis not present

## 2019-05-17 DIAGNOSIS — F79 Unspecified intellectual disabilities: Secondary | ICD-10-CM | POA: Diagnosis not present

## 2019-05-17 DIAGNOSIS — N3000 Acute cystitis without hematuria: Secondary | ICD-10-CM | POA: Diagnosis not present

## 2019-05-17 DIAGNOSIS — R1312 Dysphagia, oropharyngeal phase: Secondary | ICD-10-CM | POA: Diagnosis not present

## 2019-05-17 DIAGNOSIS — Z8616 Personal history of COVID-19: Secondary | ICD-10-CM | POA: Diagnosis not present

## 2019-05-17 DIAGNOSIS — D72829 Elevated white blood cell count, unspecified: Secondary | ICD-10-CM | POA: Diagnosis not present

## 2019-05-17 DIAGNOSIS — M6281 Muscle weakness (generalized): Secondary | ICD-10-CM | POA: Diagnosis not present

## 2019-05-19 LAB — URINE CULTURE
MICRO NUMBER:: 10122239
SPECIMEN QUALITY:: ADEQUATE

## 2019-05-20 ENCOUNTER — Other Ambulatory Visit: Payer: Self-pay | Admitting: Family Medicine

## 2019-05-20 DIAGNOSIS — D72829 Elevated white blood cell count, unspecified: Secondary | ICD-10-CM | POA: Diagnosis not present

## 2019-05-20 DIAGNOSIS — G9349 Other encephalopathy: Secondary | ICD-10-CM | POA: Diagnosis not present

## 2019-05-20 DIAGNOSIS — N390 Urinary tract infection, site not specified: Secondary | ICD-10-CM

## 2019-05-20 DIAGNOSIS — F79 Unspecified intellectual disabilities: Secondary | ICD-10-CM | POA: Diagnosis not present

## 2019-05-20 DIAGNOSIS — M6281 Muscle weakness (generalized): Secondary | ICD-10-CM | POA: Diagnosis not present

## 2019-05-20 DIAGNOSIS — Z8616 Personal history of COVID-19: Secondary | ICD-10-CM | POA: Diagnosis not present

## 2019-05-20 DIAGNOSIS — R1312 Dysphagia, oropharyngeal phase: Secondary | ICD-10-CM | POA: Diagnosis not present

## 2019-05-20 MED ORDER — NITROFURANTOIN MONOHYD MACRO 100 MG PO CAPS: 100.0000 mg | ORAL_CAPSULE | Freq: Two times a day (BID) | ORAL | 0 refills | Status: DC

## 2019-05-21 DIAGNOSIS — F339 Major depressive disorder, recurrent, unspecified: Secondary | ICD-10-CM | POA: Diagnosis not present

## 2019-05-21 DIAGNOSIS — F79 Unspecified intellectual disabilities: Secondary | ICD-10-CM | POA: Diagnosis not present

## 2019-05-21 DIAGNOSIS — R1312 Dysphagia, oropharyngeal phase: Secondary | ICD-10-CM | POA: Diagnosis not present

## 2019-05-21 DIAGNOSIS — M6281 Muscle weakness (generalized): Secondary | ICD-10-CM | POA: Diagnosis not present

## 2019-05-21 DIAGNOSIS — D72829 Elevated white blood cell count, unspecified: Secondary | ICD-10-CM | POA: Diagnosis not present

## 2019-05-21 DIAGNOSIS — Z8616 Personal history of COVID-19: Secondary | ICD-10-CM | POA: Diagnosis not present

## 2019-05-21 DIAGNOSIS — G9349 Other encephalopathy: Secondary | ICD-10-CM | POA: Diagnosis not present

## 2019-05-23 ENCOUNTER — Telehealth: Payer: Self-pay

## 2019-05-23 DIAGNOSIS — Z8616 Personal history of COVID-19: Secondary | ICD-10-CM | POA: Diagnosis not present

## 2019-05-23 DIAGNOSIS — D72829 Elevated white blood cell count, unspecified: Secondary | ICD-10-CM | POA: Diagnosis not present

## 2019-05-23 DIAGNOSIS — G9349 Other encephalopathy: Secondary | ICD-10-CM | POA: Diagnosis not present

## 2019-05-23 DIAGNOSIS — R1312 Dysphagia, oropharyngeal phase: Secondary | ICD-10-CM | POA: Diagnosis not present

## 2019-05-23 DIAGNOSIS — M6281 Muscle weakness (generalized): Secondary | ICD-10-CM | POA: Diagnosis not present

## 2019-05-23 DIAGNOSIS — F79 Unspecified intellectual disabilities: Secondary | ICD-10-CM | POA: Diagnosis not present

## 2019-05-23 NOTE — Telephone Encounter (Signed)
Copied from [place] [phone]. Topic: General - Other >> May 22, 2019  3:43 PM Yvette Rack wrote: Reason for CRM: Mikle Bosworth with [place] requests that Dr. Ancil Boozer call back regarding patient thyroid. Mikle Bosworth stated the # to the group home is [phone] and she can speak with either herself or [place] or she can be reached on her personal phone at [phone].  Patient has been in the hospital for 2 months. Caretaker does not think that she had medication while in the hospital. She also states that they have been giving her medication to her after she eats. I told her to try and take mediation as directed on the bottle and follow up in 6 weeks for recheck on labs. She is taking 100 mcg daily.

## 2019-05-24 DIAGNOSIS — D72829 Elevated white blood cell count, unspecified: Secondary | ICD-10-CM | POA: Diagnosis not present

## 2019-05-24 DIAGNOSIS — Z8616 Personal history of COVID-19: Secondary | ICD-10-CM | POA: Diagnosis not present

## 2019-05-24 DIAGNOSIS — R1312 Dysphagia, oropharyngeal phase: Secondary | ICD-10-CM | POA: Diagnosis not present

## 2019-05-24 DIAGNOSIS — F79 Unspecified intellectual disabilities: Secondary | ICD-10-CM | POA: Diagnosis not present

## 2019-05-24 DIAGNOSIS — G9349 Other encephalopathy: Secondary | ICD-10-CM | POA: Diagnosis not present

## 2019-05-24 DIAGNOSIS — M6281 Muscle weakness (generalized): Secondary | ICD-10-CM | POA: Diagnosis not present

## 2019-05-27 DIAGNOSIS — R1312 Dysphagia, oropharyngeal phase: Secondary | ICD-10-CM | POA: Diagnosis not present

## 2019-05-27 DIAGNOSIS — F79 Unspecified intellectual disabilities: Secondary | ICD-10-CM | POA: Diagnosis not present

## 2019-05-27 DIAGNOSIS — D72829 Elevated white blood cell count, unspecified: Secondary | ICD-10-CM | POA: Diagnosis not present

## 2019-05-27 DIAGNOSIS — G9349 Other encephalopathy: Secondary | ICD-10-CM | POA: Diagnosis not present

## 2019-05-27 DIAGNOSIS — Z8616 Personal history of COVID-19: Secondary | ICD-10-CM | POA: Diagnosis not present

## 2019-05-27 DIAGNOSIS — M6281 Muscle weakness (generalized): Secondary | ICD-10-CM | POA: Diagnosis not present

## 2019-05-31 DIAGNOSIS — R1312 Dysphagia, oropharyngeal phase: Secondary | ICD-10-CM | POA: Diagnosis not present

## 2019-05-31 DIAGNOSIS — Z8616 Personal history of COVID-19: Secondary | ICD-10-CM | POA: Diagnosis not present

## 2019-05-31 DIAGNOSIS — M6281 Muscle weakness (generalized): Secondary | ICD-10-CM | POA: Diagnosis not present

## 2019-05-31 DIAGNOSIS — D72829 Elevated white blood cell count, unspecified: Secondary | ICD-10-CM | POA: Diagnosis not present

## 2019-05-31 DIAGNOSIS — G9349 Other encephalopathy: Secondary | ICD-10-CM | POA: Diagnosis not present

## 2019-05-31 DIAGNOSIS — F79 Unspecified intellectual disabilities: Secondary | ICD-10-CM | POA: Diagnosis not present

## 2019-06-01 DIAGNOSIS — G9349 Other encephalopathy: Secondary | ICD-10-CM | POA: Diagnosis not present

## 2019-06-01 DIAGNOSIS — Z8616 Personal history of COVID-19: Secondary | ICD-10-CM | POA: Diagnosis not present

## 2019-06-01 DIAGNOSIS — D72829 Elevated white blood cell count, unspecified: Secondary | ICD-10-CM | POA: Diagnosis not present

## 2019-06-01 DIAGNOSIS — R1312 Dysphagia, oropharyngeal phase: Secondary | ICD-10-CM | POA: Diagnosis not present

## 2019-06-01 DIAGNOSIS — M6281 Muscle weakness (generalized): Secondary | ICD-10-CM | POA: Diagnosis not present

## 2019-06-01 DIAGNOSIS — F79 Unspecified intellectual disabilities: Secondary | ICD-10-CM | POA: Diagnosis not present

## 2019-06-03 DIAGNOSIS — R1312 Dysphagia, oropharyngeal phase: Secondary | ICD-10-CM | POA: Diagnosis not present

## 2019-06-03 DIAGNOSIS — F79 Unspecified intellectual disabilities: Secondary | ICD-10-CM | POA: Diagnosis not present

## 2019-06-03 DIAGNOSIS — D72829 Elevated white blood cell count, unspecified: Secondary | ICD-10-CM | POA: Diagnosis not present

## 2019-06-03 DIAGNOSIS — G9349 Other encephalopathy: Secondary | ICD-10-CM | POA: Diagnosis not present

## 2019-06-03 DIAGNOSIS — M6281 Muscle weakness (generalized): Secondary | ICD-10-CM | POA: Diagnosis not present

## 2019-06-03 DIAGNOSIS — Z8616 Personal history of COVID-19: Secondary | ICD-10-CM | POA: Diagnosis not present

## 2019-06-05 DIAGNOSIS — F79 Unspecified intellectual disabilities: Secondary | ICD-10-CM | POA: Diagnosis not present

## 2019-06-05 DIAGNOSIS — R1312 Dysphagia, oropharyngeal phase: Secondary | ICD-10-CM | POA: Diagnosis not present

## 2019-06-05 DIAGNOSIS — Z8616 Personal history of COVID-19: Secondary | ICD-10-CM | POA: Diagnosis not present

## 2019-06-05 DIAGNOSIS — M6281 Muscle weakness (generalized): Secondary | ICD-10-CM | POA: Diagnosis not present

## 2019-06-05 DIAGNOSIS — D72829 Elevated white blood cell count, unspecified: Secondary | ICD-10-CM | POA: Diagnosis not present

## 2019-06-05 DIAGNOSIS — G9349 Other encephalopathy: Secondary | ICD-10-CM | POA: Diagnosis not present

## 2019-06-06 ENCOUNTER — Telehealth: Payer: Self-pay

## 2019-06-06 ENCOUNTER — Telehealth: Payer: Self-pay | Admitting: Family Medicine

## 2019-06-06 DIAGNOSIS — F79 Unspecified intellectual disabilities: Secondary | ICD-10-CM | POA: Diagnosis not present

## 2019-06-06 DIAGNOSIS — D72829 Elevated white blood cell count, unspecified: Secondary | ICD-10-CM | POA: Diagnosis not present

## 2019-06-06 DIAGNOSIS — R1312 Dysphagia, oropharyngeal phase: Secondary | ICD-10-CM | POA: Diagnosis not present

## 2019-06-06 DIAGNOSIS — M6281 Muscle weakness (generalized): Secondary | ICD-10-CM | POA: Diagnosis not present

## 2019-06-06 DIAGNOSIS — G9349 Other encephalopathy: Secondary | ICD-10-CM | POA: Diagnosis not present

## 2019-06-06 DIAGNOSIS — Z8616 Personal history of COVID-19: Secondary | ICD-10-CM | POA: Diagnosis not present

## 2019-06-06 NOTE — Telephone Encounter (Signed)
Home Health Verbal Orders - Caller/Agency: Lovey Newcomer with [facility] Callback Number: [phone], OK to leave a message Requesting: Speech Frequency: 1x a week for 4 weeks

## 2019-06-06 NOTE — Telephone Encounter (Signed)
Copied from [place] [phone]. Topic: General - Inquiry >> Jun 06, 2019 11:32 AM Mathis Bud wrote: Reason for CRM: Lilia Pro from [facility] called stating the group home called stating patient is confused and believes patient has UTI.  Patient has dark urine.  Lilia Pro is requesting orders for [employer] analytics  Call back [phone]

## 2019-06-07 ENCOUNTER — Ambulatory Visit (INDEPENDENT_AMBULATORY_CARE_PROVIDER_SITE_OTHER): Payer: Medicare Other | Admitting: Family Medicine

## 2019-06-07 ENCOUNTER — Encounter: Payer: Self-pay | Admitting: Family Medicine

## 2019-06-07 ENCOUNTER — Ambulatory Visit
Admission: RE | Admit: 2019-06-07 | Discharge: 2019-06-07 | Disposition: A | Discharge status: Home / Self Care | Payer: Medicare Other | Source: Ambulatory Visit | Attending: Family Medicine | Admitting: Family Medicine

## 2019-06-07 ENCOUNTER — Other Ambulatory Visit: Payer: Self-pay | Admitting: Family Medicine

## 2019-06-07 ENCOUNTER — Other Ambulatory Visit: Payer: Self-pay

## 2019-06-07 VITALS — BP 124/80 | HR 76 | Temp 97.3°F | Resp 16 | Ht 65.0 in | Wt 180.2 lb

## 2019-06-07 DIAGNOSIS — R600 Localized edema: Secondary | ICD-10-CM | POA: Diagnosis not present

## 2019-06-07 DIAGNOSIS — I82411 Acute embolism and thrombosis of right femoral vein: Secondary | ICD-10-CM | POA: Diagnosis not present

## 2019-06-07 DIAGNOSIS — Z87440 Personal history of urinary (tract) infections: Secondary | ICD-10-CM | POA: Diagnosis not present

## 2019-06-07 DIAGNOSIS — R82998 Other abnormal findings in urine: Secondary | ICD-10-CM

## 2019-06-07 DIAGNOSIS — R410 Disorientation, unspecified: Secondary | ICD-10-CM

## 2019-06-07 DIAGNOSIS — I82431 Acute embolism and thrombosis of right popliteal vein: Secondary | ICD-10-CM | POA: Diagnosis not present

## 2019-06-07 IMAGING — US US EXTREM LOW VENOUS*R*
1 series · 13 of 24 positions shown · non-contrast
Comparison: None.

CLINICAL DATA: Lower extremity edema

EXAM:
RIGHT LOWER EXTREMITY VENOUS DUPLEX ULTRASOUND
TECHNIQUE: Gray-scale sonography with graded compression, as well as color
Doppler and duplex ultrasound were performed to evaluate the right
lower extremity deep venous system from the level of the common
femoral vein and including the common femoral, femoral, profunda
femoral, popliteal and calf veins including the posterior tibial,
peroneal and gastrocnemius veins when visible. The superficial great
saphenous vein was also interrogated. Spectral Doppler was utilized
to evaluate flow at rest and with distal augmentation maneuvers in
the common femoral, femoral and popliteal veins.

[Series 1: us extrem low venous*right* · 13 of 29 slices shown]
[im 1/29]
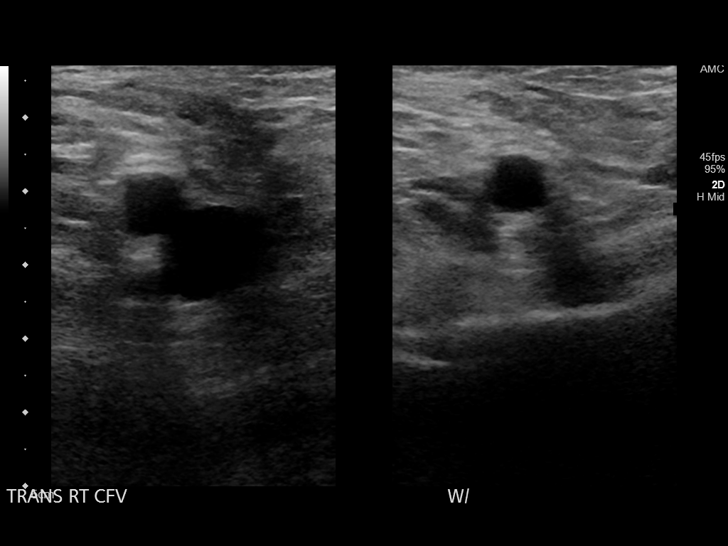
[im 3/29]
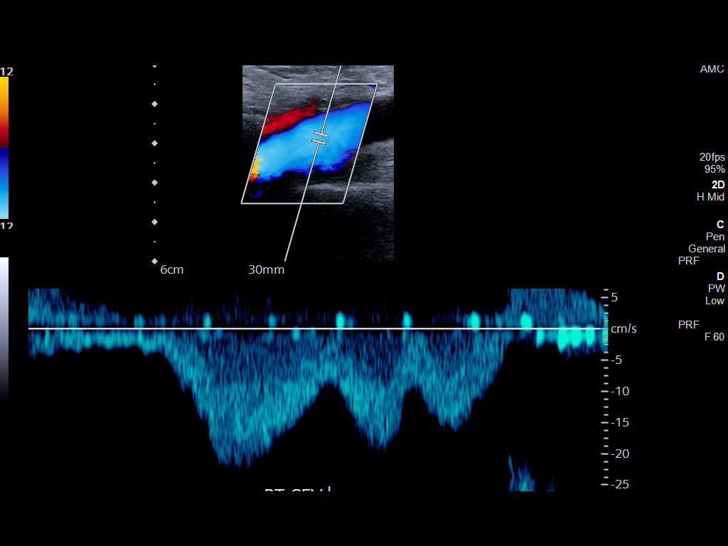
[im 5/29]
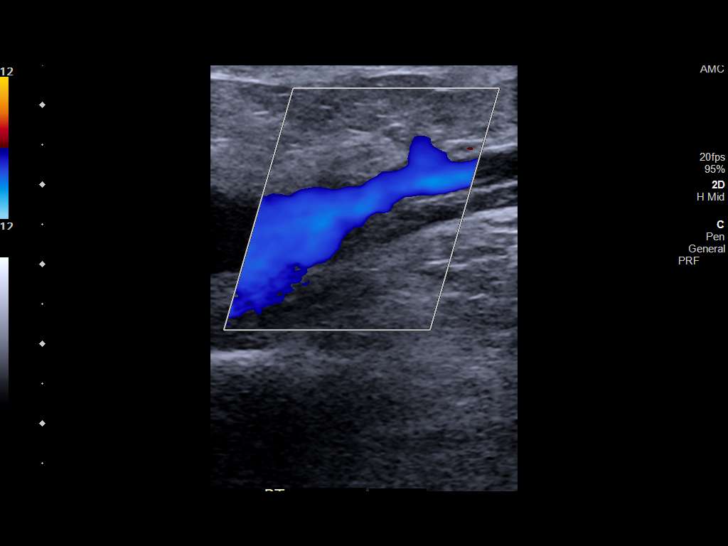
[im 8/29]
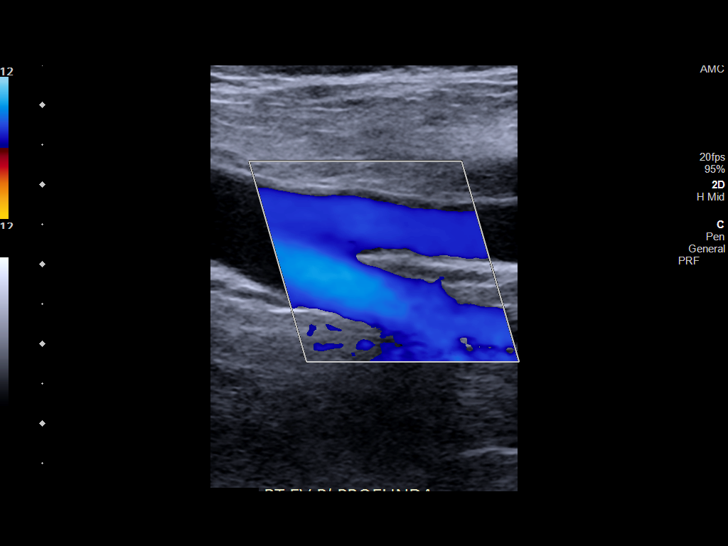
[im 10/29]
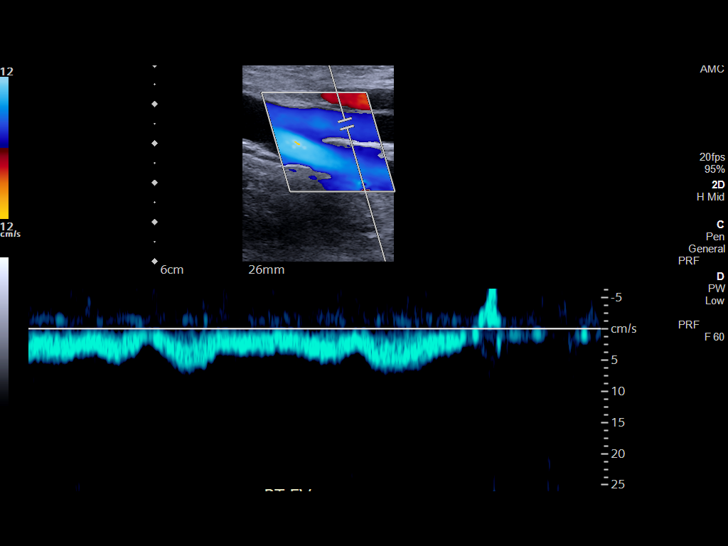
[im 13/29]
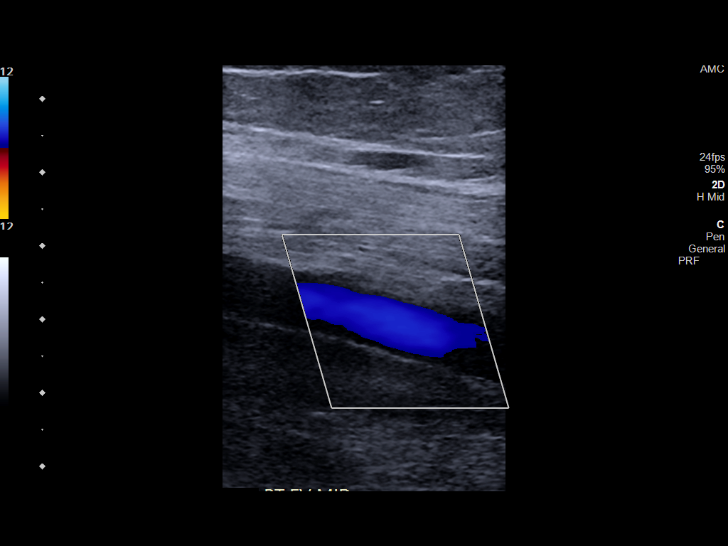
[im 15/29]
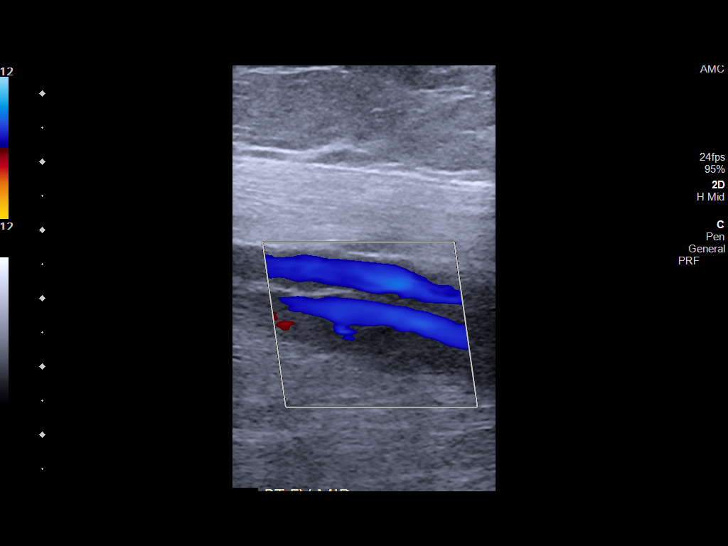
[im 16/29]
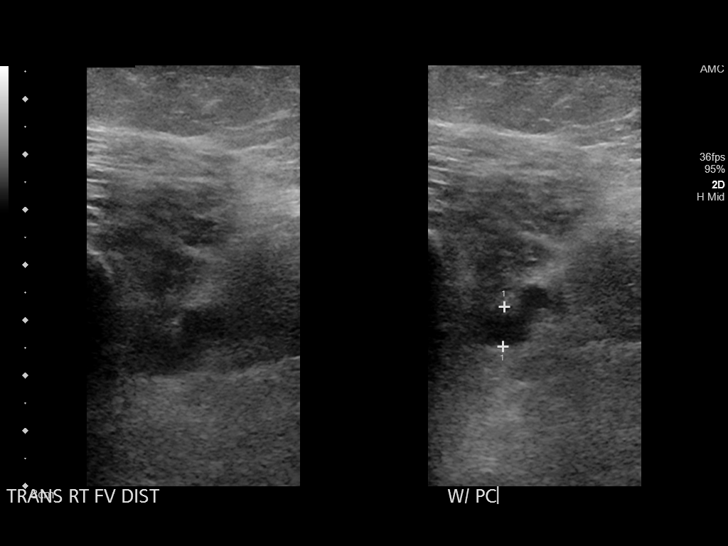
[im 19/29]
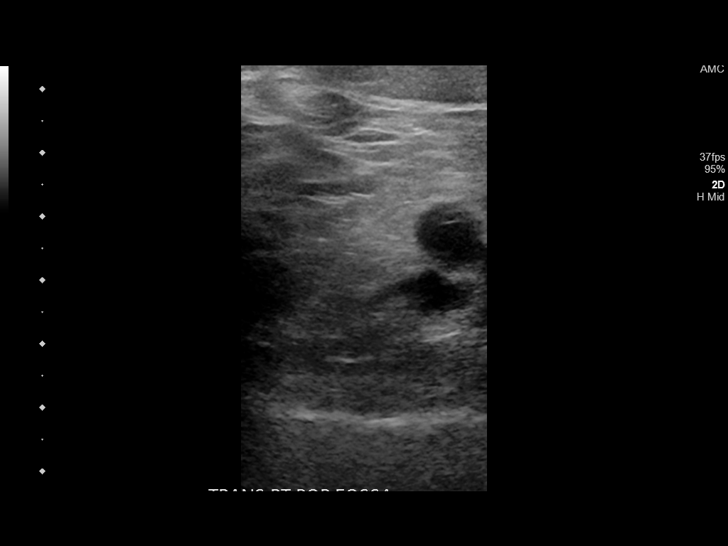
[im 21/29]
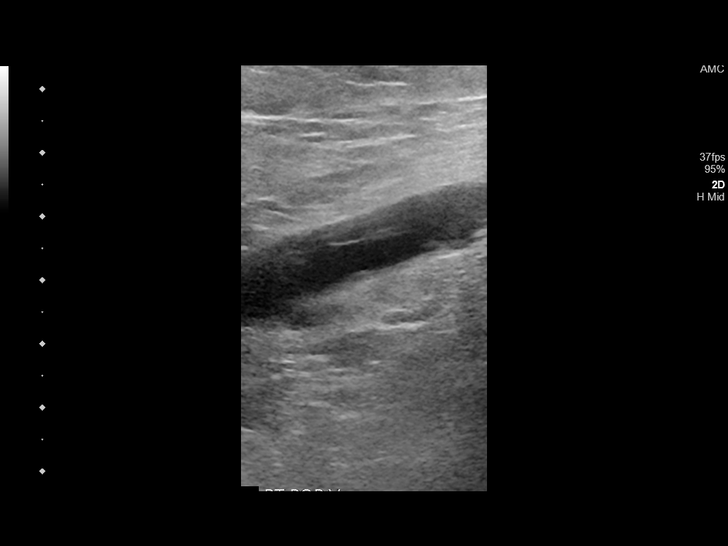
[im 24/29]
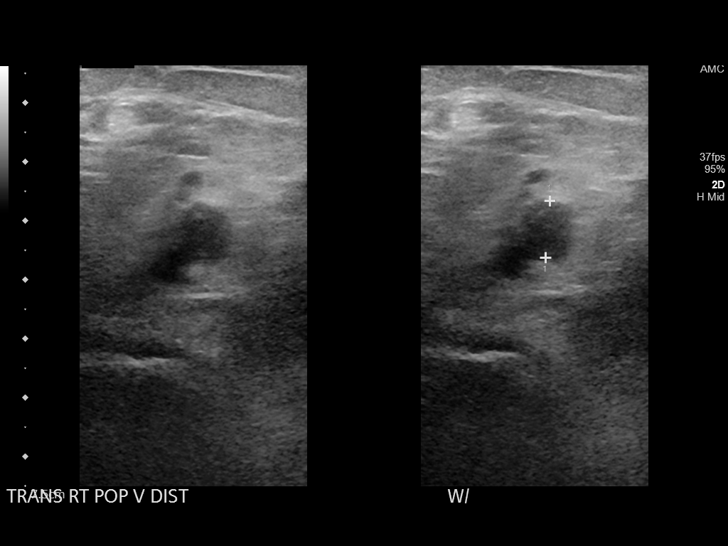
[im 26/29]
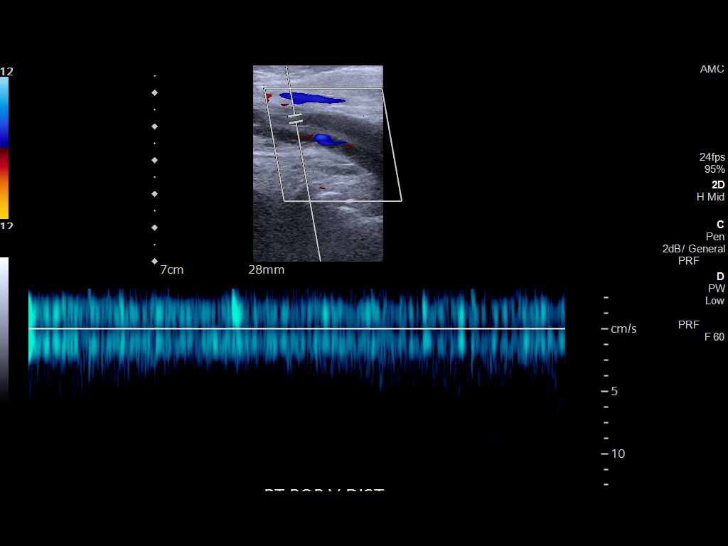
[im 29/29]
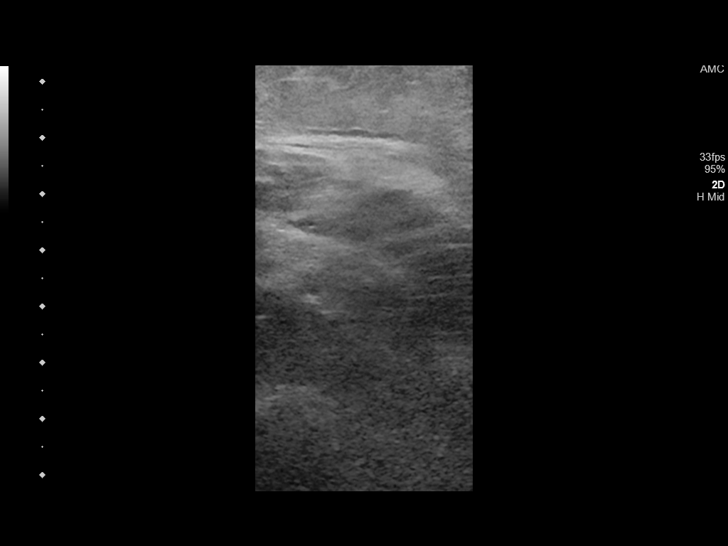

[13 of 24 positions shown; findings below may reference images not displayed]

FINDINGS: Contralateral Common Femoral Vein: Respiratory phasicity is normal
and symmetric with the symptomatic side. No evidence of thrombus.
Normal compressibility.

Common Femoral Vein: No evidence of thrombus. Normal
compressibility, respiratory phasicity and response to augmentation.

Saphenofemoral Junction: No evidence of thrombus. Normal
compressibility and flow on color Doppler imaging.

Profunda Femoral Vein: No evidence of thrombus. Normal
compressibility and flow on color Doppler imaging.

Femoral Vein: There is acute appearing deep venous thrombosis
throughout the mid to distal aspects of the right femoral vein.
There is loss of compression and augmentation in these areas with
diminished color flow in this area.

Popliteal Vein: There is acute appearing deep venous thrombosis
throughout the right popliteal vein with absence of flow. Loss of
compression and augmentation in these areas.

Calf Veins: There is acute appearing deep venous thrombosis in
visualized calf veins without appreciable flow or compressibility.
Note that the peroneal vein is not well seen.

Superficial Great Saphenous Vein: No evidence of thrombus. Normal
compressibility.

Venous Reflux:  None.

Other Findings:  None.
IMPRESSION: There is acute appearing deep venous thrombosis in visualized the
right calf veins, popliteal vein, as well as in much of the femoral
vein. Flow is absent in the right popliteal vein and visualized calf
veins. Diminished flow noted in the right mid and distal femoral
vein regions where there is evidence of acute deep venous
thrombosis.

Other deep venous structures appear patent.

These results will be called to the ordering clinician or
representative by the [HOSPITAL] at the imaging location.

## 2019-06-07 MED ORDER — RIVAROXABAN (XARELTO) VTE STARTER PACK (15 & 20 MG): ORAL_TABLET | ORAL | 0 refills | Status: DC

## 2019-06-07 MED ORDER — APIXABAN 5 MG PO TABS: ORAL_TABLET | ORAL | 0 refills | Status: DC

## 2019-06-07 MED ORDER — ENOXAPARIN SODIUM 150 MG/ML ~~LOC~~ SOLN: 1.5000 mg/kg | Freq: Two times a day (BID) | SUBCUTANEOUS | 0 refills | Status: DC

## 2019-06-07 NOTE — Telephone Encounter (Signed)
Patient will come in today for a visit.

## 2019-06-07 NOTE — Addendum Note (Signed)
Addended by: Steele Sizer F on: 06/07/2019 05:04 PM   Modules accepted: Orders

## 2019-06-07 NOTE — Addendum Note (Signed)
Addended by: Loistine Chance on: 06/07/2019 05:36 PM   Modules accepted: Orders

## 2019-06-07 NOTE — Progress Notes (Addendum)
Name: Rebecca Bruce   MRN: [id number]    DOB: 07/28/1960   Date:06/07/2019       Progress Note  Subjective  Chief Complaint  Chief Complaint  Patient presents with  . Follow-up    urine    HPI  Dark urine: caregiver noticed dark urine yesterday , patient has not been as active since she left hospital but gradually getting better and now walking with a walker instead of using a wheelchair. She has a normal appetite she graduated from puree food to chopped. She does not seem to be in pain. No increase in urinary frequency or hematuria. No fever or chills.   Caregiver noticed leg is swollen and patient does not want to walk as much  Patient Active Problem List   Diagnosis Date Noted  . Transaminitis   . Pressure injury of skin 03/28/2019  . History of COVID-19   . 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 Hyperglycemia 07/13/2018  . Pre-diabetes 01/05/2017  . Dyslipidemia 07/02/2015  . History of hyperthyroidism 07/02/2015  . History of radioactive iodine thyroid ablation 07/02/2015  . Hypothyroidism (acquired) 07/02/2015  . Hypertension, benign 07/02/2015  . Anxiety 07/02/2015  . Vitamin D deficiency 07/02/2015  . Insomnia 07/02/2015  . OA (osteoarthritis) of knee 07/02/2015  . GERD (gastroesophageal reflux disease) 07/02/2015    Past Surgical History:  Procedure Laterality Date  . DENTAL SURGERY    . IR ANGIOGRAM SELECTIVE EACH ADDITIONAL VESSEL  03/29/2019  . IR ANGIOGRAM SELECTIVE EACH ADDITIONAL VESSEL  03/29/2019  . IR ANGIOGRAM SELECTIVE EACH ADDITIONAL VESSEL  03/29/2019  . IR ANGIOGRAM SELECTIVE EACH ADDITIONAL VESSEL  03/29/2019  . IR ANGIOGRAM VISCERAL SELECTIVE  03/29/2019  . IR ANGIOGRAM VISCERAL SELECTIVE  03/29/2019  . IR US GUIDE VASC ACCESS RIGHT  03/29/2019    Family History  Problem Relation Age of Onset  . Heart disease Mother   . Breast cancer Neg Hx     Social History   Tobacco Use  . Smoking status: Never Smoker  . Smokeless  tobacco: Never Used  . Tobacco comment: smoking cessation materials not required  Substance Use Topics  . Alcohol use: No    Alcohol/week: 0.0 standard drinks     Current Outpatient Medications:  .  acetaminophen (TYLENOL) 325 MG tablet, Take 1-2 tablets (325-650 mg total) by mouth every 4 (four) hours as needed for mild pain., Disp:  , Rfl:  .  ascorbic acid (VITAMIN C) 500 MG/5ML syrup, Take 5 mLs (500 mg total) by mouth daily., Disp: 473 mL, Rfl: 12 .  atorvastatin (LIPITOR) 20 MG tablet, Take 1 tablet (20 mg total) by mouth daily at 6 PM., Disp: 30 tablet, Rfl: 0 .  gabapentin (NEURONTIN) 300 MG capsule, Take 1 capsule (300 mg total) by mouth at bedtime., Disp: , Rfl:  .  hydrocerin (EUCERIN) CREA, Apply 1 application topically daily. To bilateral feet, Disp: , Rfl: 0 .  Ipratropium-Albuterol (COMBIVENT) 20-100 MCG/ACT AERS respimat, Inhale 1 puff into the lungs every 6 (six) hours as needed for wheezing., Disp: 4 g, Rfl: 0 .  levothyroxine (SYNTHROID) 100 MCG tablet, Take 1 tablet (100 mcg total) by mouth daily before breakfast., Disp: 30 tablet, Rfl: 2 .  LORazepam (ATIVAN) 0.5 MG tablet, Take 0.5 mg by mouth daily as needed for anxiety. Can take an extra pill 30 minutes after the first dose if needed, Disp: , Rfl:  .  Multiple Vitamin (MULTIVITAMIN WITH MINERALS) TABS tablet, Take 1 tablet by mouth daily.,   Disp:  , Rfl:  .  nitrofurantoin, macrocrystal-monohydrate, (MACROBID) 100 MG capsule, Take 1 capsule (100 mg total) by mouth 2 (two) times daily., Disp: 10 capsule, Rfl: 0 .  PARoxetine (PAXIL) 30 MG tablet, Take 1 tablet (30 mg total) by mouth at bedtime., Disp: 30 tablet, Rfl: 2 .  potassium chloride (KLOR-CON) 20 MEQ packet, Take 20 mEq by mouth 2 (two) times daily., Disp: 60 packet, Rfl: 0 .  saccharomyces boulardii (FLORASTOR) 250 MG capsule, Take 1 capsule (250 mg total) by mouth daily., Disp: 30 capsule, Rfl: 0 .  VIACTIV CALCIUM PLUS D 650-12.5-40 MG-MCG CHEW, CHEW AND SWALLOW  ONE PIECE TWICE DAILY. (SUPPLEMENT) CARAMEL (Patient taking differently: Chew 1 Dose by mouth 2 (two) times daily. ), Disp: 60 tablet, Rfl: 12 .  zinc sulfate 220 (50 Zn) MG capsule, Take 1 capsule (220 mg total) by mouth daily., Disp:  , Rfl:   No Known Allergies  I personally reviewed active problem list, medication list, allergies, family history, social history with the patient/caregiver today.   ROS  Ten systems reviewed and is negative except as mentioned in HPI   Objective  Vitals:   06/07/19 1327  BP: 124/80  Pulse: 76  Resp: 16  Temp: (!) 97.3 F (36.3 C)  TempSrc: Temporal  SpO2: 96%  Weight: 180 lb 3.2 oz (81.7 kg)  Height: 5' 5" (1.651 m)    Body mass index is 29.99 kg/m.  Physical Exam  Constitutional: Patient appears well-developed and well-nourished. Overweight.  No distress.  HEENT: head atraumatic, normocephalic, pupils equal and reactive to light Cardiovascular: Normal rate, regular rhythm and normal heart sounds.  No murmur heard. Right lower leg is swollen, she grimaced during palpation.  Pulmonary/Chest: Effort normal and breath sounds normal. No respiratory distress. Abdominal: Soft.  There is no tenderness. Negative CVA tenderness  Psychiatric: Cooperative, talking, in no distress.   PHQ2/9: Depression screen [facility] 2/9 06/07/2019 01/30/2019 09/25/2018 08/03/2018 07/11/2017  Decreased Interest 0 0 0 0 0  Down, Depressed, Hopeless 0 0 0 0 0  PHQ - 2 Score 0 0 0 0 0  Altered sleeping 0 0 0 0 0  Tired, decreased energy 0 0 0 0 0  Change in appetite 0 0 0 0 0  Feeling bad or failure about yourself  0 0 0 0 0  Trouble concentrating 0 0 0 0 0  Moving slowly or fidgety/restless 0 0 0 0 0  Suicidal thoughts 0 0 0 0 0  PHQ-9 Score 0 0 0 0 0  Difficult doing work/chores Not difficult at all - Not difficult at all Not difficult at all Not difficult at all    phq 9 is negative  Fall Risk: Fall Risk  06/07/2019 05/14/2019 08/03/2018 07/13/2018 03/16/2018  Falls in  the past year? 0 0 0 0 0  Number falls in past yr: 0 0 0 0 -  Injury with Fall? 0 0 0 0 -  Risk for fall due to : - - - - -  Risk for fall due to: Comment - - - - -  Follow up - - Falls evaluation completed - -      Assessment & Plan  1. Dark urine  - Urine Culture - CBC with Differential/Platelet  2. History of UTI  - Urine Culture - CBC with Differential/Platelet  3. Edema of right lower leg  - US Venous Img Lower Unilateral Right; Future  4. Acute deep vein thrombosis (DVT) of femoral vein of right  lower extremity [facility])  Called pharmacy, they don't have started kit, back on Xarelto, rx will arrive on Tuesday, we will cover with Lovenox until than   Explained that since she had COVID and has not been walking as much she may have a DVT on her leg, if [place] positive we will need to start her on anticoagulants, discussed possible side effects

## 2019-06-07 NOTE — Telephone Encounter (Signed)
Lilia Pro called back in to follow up on request. Advised per PCP message. She said that she was about to collect a little urine from pt, she would be happy to drop it off by the office so that PCP can use. She said that pt had a time urinating to provide.    Please advise    CB: [phone]

## 2019-06-07 NOTE — Addendum Note (Signed)
Addended by: Steele Sizer F on: 06/07/2019 02:04 PM   Modules accepted: Orders, Level of Service

## 2019-06-07 NOTE — Addendum Note (Signed)
Addended by: Steele Sizer F on: 06/07/2019 04:12 PM   Modules accepted: Orders

## 2019-06-08 DIAGNOSIS — D72829 Elevated white blood cell count, unspecified: Secondary | ICD-10-CM | POA: Diagnosis not present

## 2019-06-08 DIAGNOSIS — Z8616 Personal history of COVID-19: Secondary | ICD-10-CM | POA: Diagnosis not present

## 2019-06-08 DIAGNOSIS — M6281 Muscle weakness (generalized): Secondary | ICD-10-CM | POA: Diagnosis not present

## 2019-06-08 DIAGNOSIS — R1312 Dysphagia, oropharyngeal phase: Secondary | ICD-10-CM | POA: Diagnosis not present

## 2019-06-08 DIAGNOSIS — M4802 Spinal stenosis, cervical region: Secondary | ICD-10-CM | POA: Diagnosis not present

## 2019-06-08 DIAGNOSIS — K219 Gastro-esophageal reflux disease without esophagitis: Secondary | ICD-10-CM | POA: Diagnosis not present

## 2019-06-08 DIAGNOSIS — I10 Essential (primary) hypertension: Secondary | ICD-10-CM | POA: Diagnosis not present

## 2019-06-08 DIAGNOSIS — G9349 Other encephalopathy: Secondary | ICD-10-CM | POA: Diagnosis not present

## 2019-06-08 DIAGNOSIS — R7303 Prediabetes: Secondary | ICD-10-CM | POA: Diagnosis not present

## 2019-06-08 DIAGNOSIS — F419 Anxiety disorder, unspecified: Secondary | ICD-10-CM | POA: Diagnosis not present

## 2019-06-08 DIAGNOSIS — F329 Major depressive disorder, single episode, unspecified: Secondary | ICD-10-CM | POA: Diagnosis not present

## 2019-06-08 DIAGNOSIS — F79 Unspecified intellectual disabilities: Secondary | ICD-10-CM | POA: Diagnosis not present

## 2019-06-09 LAB — CBC WITH DIFFERENTIAL/PLATELET
Absolute Monocytes: 786 cells/uL (ref 200–950)
Basophils Absolute: 58 cells/uL (ref 0–200)
Basophils Relative: 0.6 %
Eosinophils Absolute: 272 cells/uL (ref 15–500)
Eosinophils Relative: 2.8 %
HCT: 33.5 % — ABNORMAL LOW (ref 35.0–45.0)
Hemoglobin: 10.9 g/dL — ABNORMAL LOW (ref 11.7–15.5)
Lymphs Abs: 1416 cells/uL (ref 850–3900)
MCH: 28.1 pg (ref 27.0–33.0)
MCHC: 32.5 g/dL (ref 32.0–36.0)
MCV: 86.3 fL (ref 80.0–100.0)
MPV: 9.3 fL (ref 7.5–12.5)
Monocytes Relative: 8.1 %
Neutro Abs: 7168 cells/uL (ref 1500–7800)
Neutrophils Relative %: 73.9 %
Platelets: 482 10*3/uL — ABNORMAL HIGH (ref 140–400)
RBC: 3.88 10*6/uL (ref 3.80–5.10)
RDW: 13.8 % (ref 11.0–15.0)
Total Lymphocyte: 14.6 %
WBC: 9.7 10*3/uL (ref 3.8–10.8)

## 2019-06-09 LAB — URINE CULTURE
MICRO NUMBER:: 10194239
SPECIMEN QUALITY:: ADEQUATE

## 2019-06-10 DIAGNOSIS — R1312 Dysphagia, oropharyngeal phase: Secondary | ICD-10-CM | POA: Diagnosis not present

## 2019-06-10 DIAGNOSIS — G9349 Other encephalopathy: Secondary | ICD-10-CM | POA: Diagnosis not present

## 2019-06-10 DIAGNOSIS — F79 Unspecified intellectual disabilities: Secondary | ICD-10-CM | POA: Diagnosis not present

## 2019-06-10 DIAGNOSIS — M6281 Muscle weakness (generalized): Secondary | ICD-10-CM | POA: Diagnosis not present

## 2019-06-10 DIAGNOSIS — D72829 Elevated white blood cell count, unspecified: Secondary | ICD-10-CM | POA: Diagnosis not present

## 2019-06-10 DIAGNOSIS — Z8616 Personal history of COVID-19: Secondary | ICD-10-CM | POA: Diagnosis not present

## 2019-06-10 NOTE — Telephone Encounter (Signed)
Left a message for Rebecca Bruce with [facility] [phone] on verbal order from Dr. Ancil Boozer for speech therapy

## 2019-06-11 DIAGNOSIS — F79 Unspecified intellectual disabilities: Secondary | ICD-10-CM | POA: Diagnosis not present

## 2019-06-11 DIAGNOSIS — Z8616 Personal history of COVID-19: Secondary | ICD-10-CM | POA: Diagnosis not present

## 2019-06-11 DIAGNOSIS — G9349 Other encephalopathy: Secondary | ICD-10-CM | POA: Diagnosis not present

## 2019-06-11 DIAGNOSIS — D72829 Elevated white blood cell count, unspecified: Secondary | ICD-10-CM | POA: Diagnosis not present

## 2019-06-11 DIAGNOSIS — M6281 Muscle weakness (generalized): Secondary | ICD-10-CM | POA: Diagnosis not present

## 2019-06-11 DIAGNOSIS — R1312 Dysphagia, oropharyngeal phase: Secondary | ICD-10-CM | POA: Diagnosis not present

## 2019-06-13 ENCOUNTER — Ambulatory Visit (INDEPENDENT_AMBULATORY_CARE_PROVIDER_SITE_OTHER): Payer: Medicare Other | Admitting: Family Medicine

## 2019-06-13 ENCOUNTER — Ambulatory Visit: Payer: Self-pay

## 2019-06-13 ENCOUNTER — Encounter: Payer: Self-pay | Admitting: Family Medicine

## 2019-06-13 VITALS — BP 147/93 | HR 95 | Wt 180.0 lb

## 2019-06-13 DIAGNOSIS — Z8616 Personal history of COVID-19: Secondary | ICD-10-CM | POA: Diagnosis not present

## 2019-06-13 DIAGNOSIS — F79 Unspecified intellectual disabilities: Secondary | ICD-10-CM | POA: Diagnosis not present

## 2019-06-13 DIAGNOSIS — G9349 Other encephalopathy: Secondary | ICD-10-CM | POA: Diagnosis not present

## 2019-06-13 DIAGNOSIS — R600 Localized edema: Secondary | ICD-10-CM

## 2019-06-13 DIAGNOSIS — R1312 Dysphagia, oropharyngeal phase: Secondary | ICD-10-CM | POA: Diagnosis not present

## 2019-06-13 DIAGNOSIS — D72829 Elevated white blood cell count, unspecified: Secondary | ICD-10-CM | POA: Diagnosis not present

## 2019-06-13 DIAGNOSIS — M6281 Muscle weakness (generalized): Secondary | ICD-10-CM | POA: Diagnosis not present

## 2019-06-13 NOTE — Telephone Encounter (Signed)
Pt's guardian called to report swelling of right cheek, right side of lips, and right jaw with slight redness, upon awakening at 6:30 AM.  Denied rash.  Denied that pt is complaining of any resp. distress, pain, or itching.  Denied any other symptoms.  Guardian stated the swelling was not present at 9:30 PM, last night, when pt. went to bed.  Guardian voiced concern that the pt. Could be reacting to Xarelto, as this is a new medication that the pt. Recently started.  Denied any known foods that pt could have reacted to.  Called FC.  Transferred caller to the office to schedule an appt. Today.      Reason for Disposition . [1] Swelling is red AND [2] fever    Stated the swelling is slightly more red than normal.  Denied fever.  Answer Assessment - Initial Assessment Questions 1. ONSET: "When did the swelling start?" (e.g., minutes, hours, days)     6:30 AM when pt. Woke up 2. SEVERITY: "How swollen is it?"     moderate 3. ITCHING: "Is there any itching?" If so, ask: "How much?"   (Scale 1-10; mild, moderate or severe)     Denied 4. PAIN: "Is the swelling painful to touch?" If so, ask: "How painful is it?"   (Scale 1-10; mild, moderate or severe)     denied 5. CAUSE: "What do you think is causing the lip swelling?"     Poss. medication reaction/ Xarelto 6. RECURRENT SYMPTOM: "Have you had lip swelling before?" If so, ask: "When was the last time?" "What happened that time?"  7. OTHER SYMPTOMS: "Do you have any other symptoms?" (e.g., toothache)     Swelling of right side of lips, cheek and jaw; slight redness; denied any other symptoms   8. PREGNANCY: "Is there any chance you are pregnant?" "When was your last menstrual period?"     N/a  Protocols used: LIP [facility]

## 2019-06-13 NOTE — Progress Notes (Signed)
Name: Rebecca Bruce   MRN: [id number]    DOB: 1960/09/08   Date:06/13/2019       Progress Note  Subjective:    Chief Complaint  Chief Complaint  Patient presents with  . Facial Swelling    started overnight on right check down to jaw and lips    I connected with  [name]  on 06/13/19 at 10:20 AM EST by a video enabled telemedicine application and verified that I am speaking with the correct person using two identifiers.  I discussed the limitations of evaluation and management by telemedicine and the availability of in person appointments. The patient expressed understanding and agreed to proceed. Staff also discussed with the patient that there may be a patient responsible charge related to this service. Patient Location:  home - group home Provider Location:  [facility] clinic Additional Individuals present:  Care giver - Mikle Bosworth   She woke up this morning with right side of her face swollen bottom lip, her caregiver Mikle Bosworth and also Inez Catalina are not sure if swelling was located to the right or the left side of her face but in the video encounter is able to see very poor resolution of the patient with right lower lip appearing edematous otherwise could not see any color of her skin, they state that she is not itching complaining of pain, she is not complain of any dental pain.  She is having no respiratory distress, wheeze, stridor.  Her phonation is normal for her she is otherwise at her baseline mental status and activity level.  Denies any associated redness, rash or swelling around her eyes or difficulty swallowing.  They deny any complains of fever, chills, sweats, drooling.  Last week she had blood clots in her legs, she was put on xarelto one week ago, only new meds No other new meds or foods  [facility] in [place] -    Patient Active Problem List   Diagnosis Date Noted  . Transaminitis   . Pressure injury of skin 03/28/2019  . History of  COVID-19   . 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 Hyperglycemia 07/13/2018  . Pre-diabetes 01/05/2017  . Dyslipidemia 07/02/2015  . History of hyperthyroidism 07/02/2015  . History of radioactive iodine thyroid ablation 07/02/2015  . Hypothyroidism (acquired) 07/02/2015  . Hypertension, benign 07/02/2015  . Anxiety 07/02/2015  . Vitamin D deficiency 07/02/2015  . Insomnia 07/02/2015  . OA (osteoarthritis) of knee 07/02/2015  . GERD (gastroesophageal reflux disease) 07/02/2015    Social History   Tobacco Use  . Smoking status: Never Smoker  . Smokeless tobacco: Never Used  . Tobacco comment: smoking cessation materials not required  Substance Use Topics  . Alcohol use: No    Alcohol/week: 0.0 standard drinks     Current Outpatient Medications:  .  acetaminophen (TYLENOL) 325 MG tablet, Take 1-2 tablets (325-650 mg total) by mouth every 4 (four) hours as needed for mild pain., Disp:  , Rfl:  .  ascorbic acid (VITAMIN C) 500 MG/5ML syrup, Take 5 mLs (500 mg total) by mouth daily., Disp: 473 mL, Rfl: 12 .  atorvastatin (LIPITOR) 20 MG tablet, Take 1 tablet (20 mg total) by mouth daily at 6 PM., Disp: 30 tablet, Rfl: 0 .  gabapentin (NEURONTIN) 300 MG capsule, Take 1 capsule (300 mg total) by mouth at bedtime., Disp: , Rfl:  .  hydrocerin (EUCERIN) CREA, Apply 1 application topically daily. To bilateral feet, Disp: , Rfl: 0 .  Ipratropium-Albuterol (COMBIVENT) 20-100 MCG/ACT AERS respimat, Inhale 1 puff into the lungs every 6 (six) hours as needed for wheezing., Disp: 4 g, Rfl: 0 .  levothyroxine (SYNTHROID) 100 MCG tablet, Take 1 tablet (100 mcg total) by mouth daily before breakfast., Disp: 30 tablet, Rfl: 2 .  LORazepam (ATIVAN) 0.5 MG tablet, Take 0.5 mg by mouth daily as needed for anxiety. Can take an extra pill 30 minutes after the first dose if needed, Disp: , Rfl:  .  Multiple Vitamin (MULTIVITAMIN WITH MINERALS) TABS tablet, Take 1 tablet by mouth daily., Disp:  ,  Rfl:  .  nitrofurantoin, macrocrystal-monohydrate, (MACROBID) 100 MG capsule, Take 1 capsule (100 mg total) by mouth 2 (two) times daily., Disp: 10 capsule, Rfl: 0 .  PARoxetine (PAXIL) 30 MG tablet, Take 1 tablet (30 mg total) by mouth at bedtime., Disp: 30 tablet, Rfl: 2 .  potassium chloride (KLOR-CON) 20 MEQ packet, Take 20 mEq by mouth 2 (two) times daily., Disp: 60 packet, Rfl: 0 .  Rivaroxaban 15 & 20 MG TBPK, Follow package directions: Take one 15mg  tablet by mouth twice a day. On day 22, switch to one 20mg  tablet once a day. Take with food., Disp: 51 each, Rfl: 0 .  saccharomyces boulardii (FLORASTOR) 250 MG capsule, Take 1 capsule (250 mg total) by mouth daily., Disp: 30 capsule, Rfl: 0 .  VIACTIV CALCIUM PLUS D 650-12.5-40 MG-MCG CHEW, CHEW AND SWALLOW ONE PIECE TWICE DAILY. (SUPPLEMENT) CARAMEL (Patient taking differently: Chew 1 Dose by mouth 2 (two) times daily. ), Disp: 60 tablet, Rfl: 12 .  zinc sulfate 220 (50 Zn) MG capsule, Take 1 capsule (220 mg total) by mouth daily., Disp:  , Rfl:  .  enoxaparin (LOVENOX) 150 MG/ML injection, Inject 0.8 mLs (120 mg total) into the skin every 12 (twelve) hours for 3 days., Disp: 5.6 mL, Rfl: 0  No Known Allergies   Review of Systems  10 Systems reviewed and are negative for acute change except as noted in the HPI.   Objective:   Virtual encounter, vitals limited, only able to obtain the following Today's Vitals   06/13/19 1023  BP: (!) 147/93  Pulse: 95  Weight: 180 lb (81.6 kg)   Body mass index is 29.95 kg/m. Nursing Note and Vital Signs reviewed.  Physical Exam Vitals and nursing note reviewed.  Constitutional:      Appearance: She is obese.  Neurological:     Mental Status: She is alert.   poor video resolution, right lower lip appears swollen, no other edema, erythema or rash visible to face, no apparent swelling around eyes No audible wheeze or stridor  PE limited by telephone encounter  No results found for this or  any previous visit (from the past 72 hour(s)).  Assessment and Plan:     ICD-10-CM   1. Facial edema  R60.0    unclear etiology, improved after waking up and applying ice, tx with benadryl 25 mg Q 4 hours prn for swelling/rash/itching, need in person assessment     -Red flags and when to present for emergency care or RTC including fever >101.37F, chest pain, shortness of breath, new/worsening/un-resolving symptoms, reviewed with patient at time of visit. Follow up and care instructions discussed and provided in AVS. - I discussed the assessment and treatment plan with the patient. The patient was provided an opportunity to ask questions and all were answered. The patient agreed with the plan and demonstrated an understanding of the instructions.  I provided 20  minutes of non-face-to-face time during this encounter.  Delsa Grana, PA-C 06/13/19 11:38 AM

## 2019-06-13 NOTE — Patient Instructions (Signed)
Unclear cause of swelling through video encounter  Try taking benadryle 25 mg by mouth every six hours as needed for swelling (itching or rash)  We need to examine her in the office as soon as possible.  If she has any difficulty breathing, swallowing, speaking or has any fever sweats chills, choking, difficulty opening closing her mouth or eyes any to go to the ER or an urgent care as soon as possible

## 2019-06-14 ENCOUNTER — Ambulatory Visit: Payer: Medicare Other | Admitting: Family Medicine

## 2019-06-14 DIAGNOSIS — G9349 Other encephalopathy: Secondary | ICD-10-CM | POA: Diagnosis not present

## 2019-06-14 DIAGNOSIS — Z8616 Personal history of COVID-19: Secondary | ICD-10-CM | POA: Diagnosis not present

## 2019-06-14 DIAGNOSIS — M6281 Muscle weakness (generalized): Secondary | ICD-10-CM | POA: Diagnosis not present

## 2019-06-14 DIAGNOSIS — D72829 Elevated white blood cell count, unspecified: Secondary | ICD-10-CM | POA: Diagnosis not present

## 2019-06-14 DIAGNOSIS — F79 Unspecified intellectual disabilities: Secondary | ICD-10-CM | POA: Diagnosis not present

## 2019-06-14 DIAGNOSIS — R1312 Dysphagia, oropharyngeal phase: Secondary | ICD-10-CM | POA: Diagnosis not present

## 2019-06-18 DIAGNOSIS — D72829 Elevated white blood cell count, unspecified: Secondary | ICD-10-CM | POA: Diagnosis not present

## 2019-06-18 DIAGNOSIS — M6281 Muscle weakness (generalized): Secondary | ICD-10-CM | POA: Diagnosis not present

## 2019-06-18 DIAGNOSIS — F79 Unspecified intellectual disabilities: Secondary | ICD-10-CM | POA: Diagnosis not present

## 2019-06-18 DIAGNOSIS — R1312 Dysphagia, oropharyngeal phase: Secondary | ICD-10-CM | POA: Diagnosis not present

## 2019-06-18 DIAGNOSIS — Z8616 Personal history of COVID-19: Secondary | ICD-10-CM | POA: Diagnosis not present

## 2019-06-18 DIAGNOSIS — G9349 Other encephalopathy: Secondary | ICD-10-CM | POA: Diagnosis not present

## 2019-06-19 ENCOUNTER — Other Ambulatory Visit: Payer: Self-pay

## 2019-06-19 DIAGNOSIS — Z8616 Personal history of COVID-19: Secondary | ICD-10-CM | POA: Diagnosis not present

## 2019-06-19 DIAGNOSIS — G9349 Other encephalopathy: Secondary | ICD-10-CM | POA: Diagnosis not present

## 2019-06-19 DIAGNOSIS — F79 Unspecified intellectual disabilities: Secondary | ICD-10-CM | POA: Diagnosis not present

## 2019-06-19 DIAGNOSIS — R1312 Dysphagia, oropharyngeal phase: Secondary | ICD-10-CM | POA: Diagnosis not present

## 2019-06-19 DIAGNOSIS — M6281 Muscle weakness (generalized): Secondary | ICD-10-CM | POA: Diagnosis not present

## 2019-06-19 DIAGNOSIS — D72829 Elevated white blood cell count, unspecified: Secondary | ICD-10-CM | POA: Diagnosis not present

## 2019-06-21 DIAGNOSIS — D72829 Elevated white blood cell count, unspecified: Secondary | ICD-10-CM | POA: Diagnosis not present

## 2019-06-21 DIAGNOSIS — R1312 Dysphagia, oropharyngeal phase: Secondary | ICD-10-CM | POA: Diagnosis not present

## 2019-06-21 DIAGNOSIS — G9349 Other encephalopathy: Secondary | ICD-10-CM | POA: Diagnosis not present

## 2019-06-21 DIAGNOSIS — F79 Unspecified intellectual disabilities: Secondary | ICD-10-CM | POA: Diagnosis not present

## 2019-06-21 DIAGNOSIS — M6281 Muscle weakness (generalized): Secondary | ICD-10-CM | POA: Diagnosis not present

## 2019-06-21 DIAGNOSIS — Z8616 Personal history of COVID-19: Secondary | ICD-10-CM | POA: Diagnosis not present

## 2019-06-21 MED ORDER — GABAPENTIN 300 MG PO CAPS: 300.0000 mg | ORAL_CAPSULE | Freq: Every day | ORAL | 1 refills | Status: DC

## 2019-06-26 ENCOUNTER — Telehealth: Payer: Self-pay

## 2019-06-26 NOTE — Telephone Encounter (Signed)
Copied from [place] [id number]. Topic: General - Other >> Jun 26, 2019  2:38 PM Antonieta Iba C wrote: Reason for CRM: Shay with pt's facility is calling in for assistance. She would like to know if cancelled (in office) visit has to be rescheduled? She said that pt is better but has experienced some swelling. Advised of provider next opening. Pt also has a visit in April. Should pt be seen sooner?

## 2019-06-27 DIAGNOSIS — D72829 Elevated white blood cell count, unspecified: Secondary | ICD-10-CM | POA: Diagnosis not present

## 2019-06-27 DIAGNOSIS — R1312 Dysphagia, oropharyngeal phase: Secondary | ICD-10-CM | POA: Diagnosis not present

## 2019-06-27 DIAGNOSIS — Z8616 Personal history of COVID-19: Secondary | ICD-10-CM | POA: Diagnosis not present

## 2019-06-27 DIAGNOSIS — G9349 Other encephalopathy: Secondary | ICD-10-CM | POA: Diagnosis not present

## 2019-06-27 DIAGNOSIS — M6281 Muscle weakness (generalized): Secondary | ICD-10-CM | POA: Diagnosis not present

## 2019-06-27 DIAGNOSIS — F79 Unspecified intellectual disabilities: Secondary | ICD-10-CM | POA: Diagnosis not present

## 2019-06-27 NOTE — Telephone Encounter (Signed)
Spoke with the supervisor of the group home and informed that per Dr. Ancil Boozer patient can just want and come in for her routine appointment on 07/15/19, however if the caregiver feels as though the patient needs to be seen sooner we are more than happy to get patient in earlier.

## 2019-06-27 NOTE — Telephone Encounter (Signed)
Called facility they are saying do you need to see her before April 5 or can she wait until then to be seen.

## 2019-07-02 ENCOUNTER — Other Ambulatory Visit: Payer: Self-pay

## 2019-07-02 ENCOUNTER — Other Ambulatory Visit: Payer: Self-pay | Admitting: Family Medicine

## 2019-07-02 DIAGNOSIS — E039 Hypothyroidism, unspecified: Secondary | ICD-10-CM

## 2019-07-02 DIAGNOSIS — F72 Severe intellectual disabilities: Secondary | ICD-10-CM

## 2019-07-02 MED ORDER — ZINC SULFATE 220 (50 ZN) MG PO CAPS: 220.0000 mg | ORAL_CAPSULE | Freq: Every day | ORAL | Status: DC

## 2019-07-02 MED ORDER — UP4 PROBIOTICS ADULT PO CAPS: 250.0000 mg | ORAL_CAPSULE | Freq: Every day | ORAL | 3 refills | Status: DC

## 2019-07-02 MED ORDER — THEREMS-M PO TABS: 1.0000 | ORAL_TABLET | Freq: Every day | ORAL | 5 refills | Status: DC

## 2019-07-02 MED ORDER — PAROXETINE HCL 30 MG PO TABS: 30.0000 mg | ORAL_TABLET | Freq: Every day | ORAL | 2 refills | Status: DC

## 2019-07-02 NOTE — Telephone Encounter (Signed)
Refill request for thyroid medication. Levothyroxine to [facility]    Last visit: 06/13/2019   Lab Results  Component Value Date   TSH 34.294 (H) 05/15/2019     Follow up on 07/15/2019   Paxil is due for refill on July 23, 2019, Also received a request for Zinc which was sent on 03/11/2019 by Dr. Max Sane

## 2019-07-04 DIAGNOSIS — R1312 Dysphagia, oropharyngeal phase: Secondary | ICD-10-CM | POA: Diagnosis not present

## 2019-07-04 DIAGNOSIS — G9349 Other encephalopathy: Secondary | ICD-10-CM | POA: Diagnosis not present

## 2019-07-04 DIAGNOSIS — D72829 Elevated white blood cell count, unspecified: Secondary | ICD-10-CM | POA: Diagnosis not present

## 2019-07-04 DIAGNOSIS — Z8616 Personal history of COVID-19: Secondary | ICD-10-CM | POA: Diagnosis not present

## 2019-07-04 DIAGNOSIS — F79 Unspecified intellectual disabilities: Secondary | ICD-10-CM | POA: Diagnosis not present

## 2019-07-04 DIAGNOSIS — M6281 Muscle weakness (generalized): Secondary | ICD-10-CM | POA: Diagnosis not present

## 2019-07-05 ENCOUNTER — Telehealth: Payer: Self-pay | Admitting: Family Medicine

## 2019-07-05 ENCOUNTER — Other Ambulatory Visit: Payer: Self-pay | Admitting: Family Medicine

## 2019-07-05 MED ORDER — RIVAROXABAN 20 MG PO TABS: 20.0000 mg | ORAL_TABLET | Freq: Every day | ORAL | 1 refills | Status: DC

## 2019-07-05 NOTE — Telephone Encounter (Signed)
Pharmacy calling in.  States that they got a starter pack RX for Xarelto last month - pt took her last pill this morning and nothing else has been received from pharmacy about having pt continue medication. They need to know if pt needs additional medication or what is going on. [phone].

## 2019-07-05 NOTE — Telephone Encounter (Signed)
I called [facility] and notified that Dr. Ancil Boozer sent in Xarelto 20 mg for her to start taking.

## 2019-07-13 ENCOUNTER — Other Ambulatory Visit: Payer: Self-pay | Admitting: Family Medicine

## 2019-07-15 ENCOUNTER — Other Ambulatory Visit: Payer: Self-pay | Admitting: Family Medicine

## 2019-07-15 ENCOUNTER — Encounter: Payer: Self-pay | Admitting: Family Medicine

## 2019-07-15 ENCOUNTER — Ambulatory Visit (INDEPENDENT_AMBULATORY_CARE_PROVIDER_SITE_OTHER): Payer: Medicare Other | Admitting: Family Medicine

## 2019-07-15 ENCOUNTER — Other Ambulatory Visit: Payer: Self-pay

## 2019-07-15 VITALS — BP 132/78 | HR 99 | Temp 97.5°F | Resp 16 | Ht 65.0 in | Wt 170.5 lb

## 2019-07-15 DIAGNOSIS — Z1231 Encounter for screening mammogram for malignant neoplasm of breast: Secondary | ICD-10-CM

## 2019-07-15 DIAGNOSIS — I10 Essential (primary) hypertension: Secondary | ICD-10-CM

## 2019-07-15 DIAGNOSIS — E785 Hyperlipidemia, unspecified: Secondary | ICD-10-CM

## 2019-07-15 DIAGNOSIS — Z124 Encounter for screening for malignant neoplasm of cervix: Secondary | ICD-10-CM

## 2019-07-15 DIAGNOSIS — E559 Vitamin D deficiency, unspecified: Secondary | ICD-10-CM | POA: Diagnosis not present

## 2019-07-15 DIAGNOSIS — I82411 Acute embolism and thrombosis of right femoral vein: Secondary | ICD-10-CM | POA: Diagnosis not present

## 2019-07-15 DIAGNOSIS — I700 Atherosclerosis of aorta: Secondary | ICD-10-CM | POA: Diagnosis not present

## 2019-07-15 DIAGNOSIS — R739 Hyperglycemia, unspecified: Secondary | ICD-10-CM | POA: Diagnosis not present

## 2019-07-15 DIAGNOSIS — F70 Mild intellectual disabilities: Secondary | ICD-10-CM | POA: Diagnosis not present

## 2019-07-15 DIAGNOSIS — R7303 Prediabetes: Secondary | ICD-10-CM | POA: Diagnosis not present

## 2019-07-15 DIAGNOSIS — E039 Hypothyroidism, unspecified: Secondary | ICD-10-CM | POA: Diagnosis not present

## 2019-07-15 NOTE — Progress Notes (Signed)
Name: Rebecca Bruce   MRN: [id number]    DOB: 1960-08-30   Date:07/15/2019       Progress Note  Subjective  Chief Complaint  Chief Complaint  Patient presents with  . FL2 Forms   Brother: Ronda Fairly was here, discussed USPTF with brother, explained that we will not force her if she is not cooperative   HPI   HTN: taking medication as prescribed, bp today is at goal, no chest pain or palpitation ( but she is a poor historian)   Hyperlipidemia: taking medication as prescribed and no problems, denies myalgias. She lives in a group home and since [place] admission not as active, using a walker.She is on a low salt diet   Hypothyroidism :used to seeDr. Rosario Jacks, s/p ablation for hyperthyroidism, doing well on Synthroid 100 mcg daily, last TSH was abnormal, she was sick and may have not taken medication while in the hospital and rehab. We will recheck it today   MR: stable, sees psychiatrist, Dr. Jonette Pesa her behavior, she lives in a group home She needs assistance bathing and dressing, managing her money.Her brother [name] is the guardian, he came in with her toay   Pre-diabetes: hgbA1C was 74.3%In 2017, down to 6.0%last yearShedenies polyphagia, polydipsia or polyuria.   DVT right lower leg: diagnosed with Feb, seems to be walking better now, not complaining of pain. Still weak from COVID-19   Patient Active Problem List   Diagnosis Date Noted  . Transaminitis   . Pressure injury of skin 03/28/2019  . History of COVID-19   . 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 Hyperglycemia 07/13/2018  . Pre-diabetes 01/05/2017  . Dyslipidemia 07/02/2015  . History of hyperthyroidism 07/02/2015  . History of radioactive iodine thyroid ablation 07/02/2015  . Hypothyroidism (acquired) 07/02/2015  . Hypertension, benign 07/02/2015  . Anxiety 07/02/2015  . Vitamin D deficiency 07/02/2015  . Insomnia 07/02/2015  . OA (osteoarthritis) of knee 07/02/2015  . GERD  (gastroesophageal reflux disease) 07/02/2015    Past Surgical History:  Procedure Laterality Date  . DENTAL SURGERY    . IR ANGIOGRAM SELECTIVE EACH ADDITIONAL VESSEL  03/29/2019  . IR ANGIOGRAM SELECTIVE EACH ADDITIONAL VESSEL  03/29/2019  . IR ANGIOGRAM SELECTIVE EACH ADDITIONAL VESSEL  03/29/2019  . IR ANGIOGRAM SELECTIVE EACH ADDITIONAL VESSEL  03/29/2019  . IR ANGIOGRAM VISCERAL SELECTIVE  03/29/2019  . IR ANGIOGRAM VISCERAL SELECTIVE  03/29/2019  . IR US GUIDE VASC ACCESS RIGHT  03/29/2019    Family History  Problem Relation Age of Onset  . Heart disease Mother   . Breast cancer Neg Hx     Social History   Tobacco Use  . Smoking status: Never Smoker  . Smokeless tobacco: Never Used  . Tobacco comment: smoking cessation materials not required  Substance Use Topics  . Alcohol use: No    Alcohol/week: 0.0 standard drinks     Current Outpatient Medications:  .  acetaminophen (TYLENOL) 325 MG tablet, Take 1-2 tablets (325-650 mg total) by mouth every 4 (four) hours as needed for mild pain., Disp:  , Rfl:  .  ascorbic acid (VITAMIN C) 500 MG/5ML syrup, Take 5 mLs (500 mg total) by mouth daily., Disp: 473 mL, Rfl: 12 .  atorvastatin (LIPITOR) 20 MG tablet, Take 1 tablet (20 mg total) by mouth daily at 6 PM., Disp: 30 tablet, Rfl: 0 .  gabapentin (NEURONTIN) 300 MG capsule, Take 1 capsule (300 mg total) by mouth at bedtime., Disp: 900 capsule, Rfl: 1 .  hydrocerin (EUCERIN) CREA, Apply 1 application topically daily. To bilateral feet, Disp: , Rfl: 0 .  Ipratropium-Albuterol (COMBIVENT) 20-100 MCG/ACT AERS respimat, Inhale 1 puff into the lungs every 6 (six) hours as needed for wheezing., Disp: 4 g, Rfl: 0 .  levothyroxine (SYNTHROID) 100 MCG tablet, Take 1 tablet (100 mcg total) by mouth daily before breakfast., Disp: 30 tablet, Rfl: 2 .  LORazepam (ATIVAN) 0.5 MG tablet, Take 0.5 mg by mouth daily as needed for anxiety. Can take an extra pill 30 minutes after the first dose if  needed, Disp: , Rfl:  .  Multiple Vitamin (MULTIVITAMIN WITH MINERALS) TABS tablet, Take 1 tablet by mouth daily., Disp:  , Rfl:  .  Multiple Vitamins-Minerals (THEREMS-M) TABS, Take 1 tablet by mouth daily., Disp: 30 tablet, Rfl: 5 .  PARoxetine (PAXIL) 30 MG tablet, Take 1 tablet (30 mg total) by mouth at bedtime., Disp: 30 tablet, Rfl: 2 .  potassium chloride (KLOR-CON) 20 MEQ packet, Take 20 mEq by mouth 2 (two) times daily., Disp: 60 packet, Rfl: 0 .  Probiotic Product (UP4 PROBIOTICS ADULT) CAPS, Take 250 mg by mouth daily., Disp: 30 capsule, Rfl: 3 .  rivaroxaban (XARELTO) 20 MG TABS tablet, Take 1 tablet (20 mg total) by mouth daily with supper., Disp: 30 tablet, Rfl: 1 .  saccharomyces boulardii (FLORASTOR) 250 MG capsule, Take 1 capsule (250 mg total) by mouth daily., Disp: 30 capsule, Rfl: 0 .  VIACTIV CALCIUM PLUS D 650-12.5-40 MG-MCG CHEW, CHEW AND SWALLOW ONE PIECE TWICE DAILY. (SUPPLEMENT) CARAMEL (Patient taking differently: Chew 1 Dose by mouth 2 (two) times daily. ), Disp: 60 tablet, Rfl: 12 .  zinc sulfate 220 (50 Zn) MG capsule, Take 1 capsule (220 mg total) by mouth daily., Disp: , Rfl:   No Known Allergies  I personally reviewed active problem list, medication list, allergies, family history, social history, health maintenance with the patient/caregiver today.   ROS  Ten systems reviewed and is negative except as mentioned in HPI   Objective  Vitals:   07/15/19 0958  BP: 132/78  Pulse: 99  Resp: 16  Temp: (!) 97.5 F (36.4 C)  TempSrc: Temporal  SpO2: 98%  Weight: 170 lb 8 oz (77.3 kg)  Height: 5\' 5"  (1.651 m)    Body mass index is 28.37 kg/m.  Physical Exam  Constitutional: Patient appears well-developed and well-nourished. Overweight.  No distress.  HEENT: head atraumatic, normocephalic, pupils equal and reactive to light Cardiovascular: Normal rate, regular rhythm and normal heart sounds.  No murmur heard. Trace of right lower extremity edema GYN:  not done, patient got agitated and scared Pulmonary/Chest: Effort normal and breath sounds normal. No respiratory distress. Abdominal: Soft.  There is no tenderness. Psychiatric: Patient has a normal mood and affect. behavior is normal. Judgment and thought content normal.   PHQ2/9: Depression screen [facility] 2/9 07/15/2019 06/13/2019 06/07/2019 01/30/2019 09/25/2018  Decreased Interest 0 0 0 0 0  Down, Depressed, Hopeless 0 0 0 0 0  PHQ - 2 Score 0 0 0 0 0  Altered sleeping 0 0 0 0 0  Tired, decreased energy 0 0 0 0 0  Change in appetite 0 0 0 0 0  Feeling bad or failure about yourself  0 - 0 0 0  Trouble concentrating 0 - 0 0 0  Moving slowly or fidgety/restless 0 - 0 0 0  Suicidal thoughts 0 - 0 0 0  PHQ-9 Score 0 0 0 0 0  Difficult doing work/chores Not  difficult at all - Not difficult at all - Not difficult at all    phq 9 is negative   Fall Risk: Fall Risk  07/15/2019 06/13/2019 06/07/2019 05/14/2019 08/03/2018  Falls in the past year? 0 0 0 0 0  Number falls in past yr: 0 0 0 0 0  Injury with Fall? 0 0 0 0 0  Risk for fall due to : - - - - -  Risk for fall due to: Comment - - - - -  Follow up - - - - Falls evaluation completed     Functional Status Survey: Is the patient deaf or have difficulty hearing?: Yes Does the patient have difficulty seeing, even when wearing glasses/contacts?: No Does the patient have difficulty concentrating, remembering, or making decisions?: Yes Does the patient have difficulty walking or climbing stairs?: Yes Does the patient have difficulty dressing or bathing?: Yes Does the patient have difficulty doing errands alone such as visiting a doctor's office or shopping?: Yes    Assessment & Plan  1. Hypothyroidism (acquired)  - TSH  2. Hypertension, benign  - CBC with Differential/Platelet - COMPLETE METABOLIC PANEL WITH GFR  3. Pre-diabetes   4. Dyslipidemia  - Lipid panel  5. Mild intellectual disabilities   6. Hyperglycemia  -  Hemoglobin A1c  7. Atherosclerosis of aorta ([place])   8. Vitamin D deficiency  - VITAMIN D 25 Hydroxy (Vit-D Deficiency, Fractures)  9. Acute deep vein thrombosis (DVT) of femoral vein of right lower extremity (HCC)  - US Venous Img Lower Unilateral Right; Future 3 months from last check   10. Cervical cancer screening  - Cytology - PAP  11. Breast cancer screening by mammogram  - MM 3D SCREEN BREAST BILATERAL; Future

## 2019-07-16 ENCOUNTER — Telehealth: Payer: Self-pay | Admitting: Family Medicine

## 2019-07-16 LAB — LIPID PANEL
Cholesterol: 187 mg/dL (ref <200)
HDL: 65 mg/dL (ref >=50)
LDL Cholesterol (Calc): 104 mg/dL (calc) — ABNORMAL HIGH
Non-HDL Cholesterol (Calc): 122 mg/dL (calc) (ref <130)
Total CHOL/HDL Ratio: 2.9 (calc) (ref <5.0)
Triglycerides: 88 mg/dL (ref <150)

## 2019-07-16 LAB — COMPLETE METABOLIC PANEL WITH GFR
AG Ratio: 1.3 (calc) (ref 1.0–2.5)
ALT: 20 U/L (ref 6–29)
AST: 19 U/L (ref 10–35)
Albumin: 4 g/dL (ref 3.6–5.1)
Alkaline phosphatase (APISO): 73 U/L (ref 37–153)
BUN: 10 mg/dL (ref 7–25)
CO2: 30 mmol/L (ref 20–32)
Calcium: 9.7 mg/dL (ref 8.6–10.4)
Chloride: 103 mmol/L (ref 98–110)
Creat: 0.81 mg/dL (ref 0.50–1.05)
GFR, Est African American: 93 mL/min/{1.73_m2} (ref >=60)
GFR, Est Non African American: 81 mL/min/{1.73_m2} (ref >=60)
Globulin: 3 g/dL (calc) (ref 1.9–3.7)
Glucose, Bld: 95 mg/dL (ref 65–99)
Potassium: 3.7 mmol/L (ref 3.5–5.3)
Sodium: 141 mmol/L (ref 135–146)
Total Bilirubin: 0.5 mg/dL (ref 0.2–1.2)
Total Protein: 7 g/dL (ref 6.1–8.1)

## 2019-07-16 LAB — HEMOGLOBIN A1C
Hgb A1c MFr Bld: 5.1 % of total Hgb (ref <5.7)
Mean Plasma Glucose: 100 (calc)
eAG (mmol/L): 5.5 (calc)

## 2019-07-16 LAB — CBC WITH DIFFERENTIAL/PLATELET
Absolute Monocytes: 565 cells/uL (ref 200–950)
Basophils Absolute: 40 cells/uL (ref 0–200)
Basophils Relative: 0.8 %
Eosinophils Absolute: 90 cells/uL (ref 15–500)
Eosinophils Relative: 1.8 %
HCT: 39.5 % (ref 35.0–45.0)
Hemoglobin: 12.3 g/dL (ref 11.7–15.5)
Lymphs Abs: 1300 cells/uL (ref 850–3900)
MCH: 26.9 pg — ABNORMAL LOW (ref 27.0–33.0)
MCHC: 31.1 g/dL — ABNORMAL LOW (ref 32.0–36.0)
MCV: 86.4 fL (ref 80.0–100.0)
MPV: 10 fL (ref 7.5–12.5)
Monocytes Relative: 11.3 %
Neutro Abs: 3005 cells/uL (ref 1500–7800)
Neutrophils Relative %: 60.1 %
Platelets: 309 10*3/uL (ref 140–400)
RBC: 4.57 10*6/uL (ref 3.80–5.10)
RDW: 12.7 % (ref 11.0–15.0)
Total Lymphocyte: 26 %
WBC: 5 10*3/uL (ref 3.8–10.8)

## 2019-07-16 LAB — TSH: TSH: 18.69 mIU/L — ABNORMAL HIGH (ref 0.40–4.50)

## 2019-07-16 LAB — VITAMIN D 25 HYDROXY (VIT D DEFICIENCY, FRACTURES): Vit D, 25-Hydroxy: 34 ng/mL (ref 30–100)

## 2019-07-16 NOTE — Telephone Encounter (Signed)
Spoke with patient brother stated patient was in home.

## 2019-08-20 DIAGNOSIS — F339 Major depressive disorder, recurrent, unspecified: Secondary | ICD-10-CM | POA: Diagnosis not present

## 2019-11-15 ENCOUNTER — Ambulatory Visit (INDEPENDENT_AMBULATORY_CARE_PROVIDER_SITE_OTHER): Payer: Medicare Other | Admitting: Family Medicine

## 2019-11-15 ENCOUNTER — Encounter: Payer: Self-pay | Admitting: Family Medicine

## 2019-11-15 ENCOUNTER — Other Ambulatory Visit: Payer: Self-pay

## 2019-11-15 VITALS — BP 160/100 | HR 84 | Temp 98.5°F | Resp 16 | Ht 65.0 in | Wt 173.0 lb

## 2019-11-15 DIAGNOSIS — E039 Hypothyroidism, unspecified: Secondary | ICD-10-CM | POA: Diagnosis not present

## 2019-11-15 DIAGNOSIS — I700 Atherosclerosis of aorta: Secondary | ICD-10-CM

## 2019-11-15 DIAGNOSIS — F70 Mild intellectual disabilities: Secondary | ICD-10-CM

## 2019-11-15 DIAGNOSIS — I10 Essential (primary) hypertension: Secondary | ICD-10-CM | POA: Diagnosis not present

## 2019-11-15 DIAGNOSIS — E559 Vitamin D deficiency, unspecified: Secondary | ICD-10-CM

## 2019-11-15 DIAGNOSIS — E785 Hyperlipidemia, unspecified: Secondary | ICD-10-CM

## 2019-11-15 DIAGNOSIS — Z86718 Personal history of other venous thrombosis and embolism: Secondary | ICD-10-CM | POA: Diagnosis not present

## 2019-11-15 DIAGNOSIS — L0293 Carbuncle, unspecified: Secondary | ICD-10-CM

## 2019-11-15 DIAGNOSIS — R7303 Prediabetes: Secondary | ICD-10-CM

## 2019-11-15 MED ORDER — GABAPENTIN 300 MG PO CAPS: 300.0000 mg | ORAL_CAPSULE | Freq: Every day | ORAL | 1 refills | Status: DC

## 2019-11-15 NOTE — Progress Notes (Signed)
Name: Denajah Farias   MRN: [id number]    DOB: 1961-01-02   Date:11/15/2019       Progress Note  Subjective  Chief Complaint  Chief Complaint  Patient presents with  . Recurrent Skin Infections    She has boil underneath her arms, thighs and vagina.  . Varicose Veins    Caretaker has concerns about the look of her veins and wants to have them evaluated.    HPI  HTN: taking medication as prescribed, bp today was high when she first came in, caregiver Erby Pian states she was anxious when she came in. No chest pain or palpitation ( but she is a poor historian)   Hyperlipidemia: taking medication as prescribed and no problems, denies myalgias. She lives in a group home, she is back [facility], has more active   Hypothyroidism :used to seeDr. Rosario Jacks, s/p ablation for hyperthyroidism, doing well on Synthroid 100 mcg daily, last TSH was abnormal,she needs to follow up with Dr. Rosario Jacks   MR: stable, sees psychiatrist, Dr. Jonette Pesa her behavior, she lives in a group homeShe needs assistance bathing and dressing, managing her money.Her brother [name] is the guardian, he came in with her last visit, she refused to have pap smear at the time   Pre-diabetes: hgbA1C was 6.3%In 2017, down to normal in [place] Shedenies polyphagia, polydipsia or polyuria.  DVT right lower leg: diagnosed with Feb, seems to be walking better now, not complaining of pain. She was supposed to go back for repeat US right leg but did not have it done, still on Xarelto, we will repeat doppler and stop xarelto if repeat studies negative   Recurrent boils: no lesions at this time, however caregiver states she has been boils on her axilla and groin intermittently, advised bathing in one tub filled with water with 1/4 cup of bleach twice weekly.   Patient Active Problem List   Diagnosis Date Noted  . Transaminitis   . Pressure injury of skin 03/28/2019  . History of COVID-19   . Mild intellectual  disabilities 07/13/2018  . Hyperglycemia 07/13/2018  . Pre-diabetes 01/05/2017  . Dyslipidemia 07/02/2015  . History of hyperthyroidism 07/02/2015  . History of radioactive iodine thyroid ablation 07/02/2015  . Hypothyroidism (acquired) 07/02/2015  . Hypertension, benign 07/02/2015  . Anxiety 07/02/2015  . Vitamin D deficiency 07/02/2015  . Insomnia 07/02/2015  . OA (osteoarthritis) of knee 07/02/2015  . GERD (gastroesophageal reflux disease) 07/02/2015    Past Surgical History:  Procedure Laterality Date  . DENTAL SURGERY    . IR ANGIOGRAM SELECTIVE EACH ADDITIONAL VESSEL  03/29/2019  . IR ANGIOGRAM SELECTIVE EACH ADDITIONAL VESSEL  03/29/2019  . IR ANGIOGRAM SELECTIVE EACH ADDITIONAL VESSEL  03/29/2019  . IR ANGIOGRAM SELECTIVE EACH ADDITIONAL VESSEL  03/29/2019  . IR ANGIOGRAM VISCERAL SELECTIVE  03/29/2019  . IR ANGIOGRAM VISCERAL SELECTIVE  03/29/2019  . IR US GUIDE VASC ACCESS RIGHT  03/29/2019    Family History  Problem Relation Age of Onset  . Heart disease Mother   . Breast cancer Neg Hx     Social History   Tobacco Use  . Smoking status: Never Smoker  . Smokeless tobacco: Never Used  . Tobacco comment: smoking cessation materials not required  Substance Use Topics  . Alcohol use: No    Alcohol/week: 0.0 standard drinks     Current Outpatient Medications:  .  acetaminophen (TYLENOL) 325 MG tablet, Take 1-2 tablets (325-650 mg total) by mouth every 4 (four) hours  as needed for mild pain., Disp:  , Rfl:  .  ascorbic acid (VITAMIN C) 500 MG/5ML syrup, Take 5 mLs (500 mg total) by mouth daily., Disp: 473 mL, Rfl: 12 .  atorvastatin (LIPITOR) 20 MG tablet, Take 1 tablet (20 mg total) by mouth daily at 6 PM., Disp: 30 tablet, Rfl: 0 .  gabapentin (NEURONTIN) 300 MG capsule, Take 1 capsule (300 mg total) by mouth at bedtime., Disp: 900 capsule, Rfl: 1 .  hydrocerin (EUCERIN) CREA, Apply 1 application topically daily. To bilateral feet, Disp: , Rfl: 0 .   Ipratropium-Albuterol (COMBIVENT) 20-100 MCG/ACT AERS respimat, Inhale 1 puff into the lungs every 6 (six) hours as needed for wheezing., Disp: 4 g, Rfl: 0 .  levothyroxine (SYNTHROID) 100 MCG tablet, Take 1 tablet (100 mcg total) by mouth daily before breakfast., Disp: 30 tablet, Rfl: 2 .  LORazepam (ATIVAN) 0.5 MG tablet, Take 0.5 mg by mouth daily as needed for anxiety. Can take an extra pill 30 minutes after the first dose if needed, Disp: , Rfl:  .  Multiple Vitamin (MULTIVITAMIN WITH MINERALS) TABS tablet, Take 1 tablet by mouth daily., Disp:  , Rfl:  .  Multiple Vitamins-Minerals (THEREMS-M) TABS, Take 1 tablet by mouth daily., Disp: 30 tablet, Rfl: 5 .  PARoxetine (PAXIL) 30 MG tablet, Take 1 tablet (30 mg total) by mouth at bedtime., Disp: 30 tablet, Rfl: 2 .  potassium chloride (KLOR-CON) 20 MEQ packet, Take 20 mEq by mouth 2 (two) times daily., Disp: 60 packet, Rfl: 0 .  Probiotic Product (UP4 PROBIOTICS ADULT) CAPS, Take 250 mg by mouth daily., Disp: 30 capsule, Rfl: 3 .  rivaroxaban (XARELTO) 20 MG TABS tablet, Take 1 tablet (20 mg total) by mouth daily with supper., Disp: 30 tablet, Rfl: 1 .  saccharomyces boulardii (FLORASTOR) 250 MG capsule, Take 1 capsule (250 mg total) by mouth daily., Disp: 30 capsule, Rfl: 0 .  VIACTIV CALCIUM PLUS D 650-12.5-40 MG-MCG CHEW, CHEW AND SWALLOW ONE PIECE TWICE DAILY. (SUPPLEMENT) CARAMEL (Patient taking differently: Chew 1 Dose by mouth 2 (two) times daily. ), Disp: 60 tablet, Rfl: 12 .  zinc sulfate 220 (50 Zn) MG capsule, Take 1 capsule (220 mg total) by mouth daily., Disp: , Rfl:   No Known Allergies  I personally reviewed active problem list, medication list, allergies, family history, social history, health maintenance with the patient/caregiver today.   ROS  Constitutional: Negative for fever or weight change.  Respiratory: Negative for cough and shortness of breath.   Cardiovascular: Negative for chest pain or palpitations.   Gastrointestinal: Negative for abdominal pain, no bowel changes.  Musculoskeletal: Negative for gait problem or joint swelling.  Skin: Negative for rash.  Neurological: Negative for dizziness or headache.  No other specific complaints in a complete review of systems (except as listed in HPI above).  Objective  Vitals:   11/15/19 0922 11/15/19 1017  BP: (!) 150/100 (!) 160/100  Pulse: 84   Resp: 16   Temp: 98.5 F (36.9 C)   TempSrc: Temporal   SpO2: 97%   Weight: 173 lb (78.5 kg)   Height: 5\' 5"  (1.651 m)     Body mass index is 28.79 kg/m.  Physical Exam  Constitutional: Patient appears well-developed and well-nourished. Overweight. No distress.  HEENT: head atraumatic, normocephalic, pupils equal and reactive to light, neck supple Cardiovascular: Normal rate, regular rhythm and normal heart sounds.  No murmur heard. No BLE edema. Pulmonary/Chest: Effort normal and breath sounds normal. No respiratory distress.  Abdominal: Soft.  There is no tenderness. Psychiatric: Patient has a normal mood and affect. Cooperative   PHQ2/9: Depression screen [facility] 2/9 11/15/2019 07/15/2019 06/13/2019 06/07/2019 01/30/2019  Decreased Interest 0 0 0 0 0  Down, Depressed, Hopeless 0 0 0 0 0  PHQ - 2 Score 0 0 0 0 0  Altered sleeping 0 0 0 0 0  Tired, decreased energy 0 0 0 0 0  Change in appetite 0 0 0 0 0  Feeling bad or failure about yourself  0 0 - 0 0  Trouble concentrating 0 0 - 0 0  Moving slowly or fidgety/restless 0 0 - 0 0  Suicidal thoughts 0 0 - 0 0  PHQ-9 Score 0 0 0 0 0  Difficult doing work/chores - Not difficult at all - Not difficult at all -  Some recent data might be hidden    phq 9 is negative   Fall Risk: Fall Risk  11/15/2019 11/15/2019 07/15/2019 06/13/2019 06/07/2019  Falls in the past year? 0 0 0 0 0  Number falls in past yr: 0 0 0 0 0  Injury with Fall? 0 0 0 0 0  Risk for fall due to : - - - - -  Risk for fall due to: Comment - - - - -  Follow up - - - - -     Functional Status Survey: Is the patient deaf or have difficulty hearing?: No Does the patient have difficulty seeing, even when wearing glasses/contacts?: No Does the patient have difficulty concentrating, remembering, or making decisions?: Yes Does the patient have difficulty walking or climbing stairs?: Yes Does the patient have difficulty dressing or bathing?: No Does the patient have difficulty doing errands alone such as visiting a doctor's office or shopping?: Yes    Assessment & Plan  1. Recurrent boils  No problems at this time, advised either to bath with 1/4 cup of bleach in the tub twice a week  or use wash clothes with a mixture of 8 cups of water with 1 tbsp of bleach every other day   2. Hypothyroidism (acquired)  Needs to follow up with Dr. Rosario Jacks, I am not able to see his notes  3. Hypertension, benign  Elevated when she first arrived , and it was rechecked but patient had already started walking to go home. Advised to return for bp check in 1-2 weeks  4. Pre-diabetes  Doing well with life style modification, last A1C was back to normal, continue the good work   5. Mild intellectual disabilities  Stable, seems happy   6. Dyslipidemia   7. Atherosclerosis of aorta ([place])  On statin therapy   8. Vitamin D deficiency  Continue supplementation   9. History of deep venous thrombosis (DVT) of distal vein of right lower extremity  Explained importance of getting US done again

## 2019-11-19 ENCOUNTER — Encounter: Payer: Self-pay | Admitting: Emergency Medicine

## 2019-11-19 ENCOUNTER — Ambulatory Visit: Payer: Self-pay | Admitting: *Deleted

## 2019-11-19 ENCOUNTER — Other Ambulatory Visit: Payer: Self-pay

## 2019-11-19 ENCOUNTER — Ambulatory Visit
Admission: EM | Admit: 2019-11-19 | Discharge: 2019-11-19 | Disposition: A | Discharge status: Home / Self Care | Payer: Medicare Other | Attending: Emergency Medicine | Admitting: Emergency Medicine

## 2019-11-19 DIAGNOSIS — I10 Essential (primary) hypertension: Secondary | ICD-10-CM

## 2019-11-19 MED ORDER — AMLODIPINE BESYLATE 2.5 MG PO TABS: 2.5000 mg | ORAL_TABLET | Freq: Every day | ORAL | 0 refills | Status: DC

## 2019-11-19 NOTE — Telephone Encounter (Signed)
Caller is patient's caregiver at group home, Rebecca Bruce. Caregiver reports she is not with patient at this time but was on phone with another caregiver with patient. BP has been elevated and remained elevated throughout day today. This am BP 149/103 and now 144/104 while patient at rest. Caregiver, Rebecca Bruce reports patient denies headache, chest pain, weakness, difficulty breathing, blurred vision. Care giver requesting medication adjustment to treat hypertension. No appt available within 3 days with PCP. Instructed patient's caregiver to go to ED if bp remains elevated and symptoms worsen. Patient's caregiver requesting clarification of PCP recommendations to treat elevated BP and call group home at [phone] if possible. Care advise given and patient's caregiver verbalized understanding.   Reason for Disposition . Systolic BP  >= 850 OR Diastolic >= 277  Answer Assessment - Initial Assessment Questions 1. BLOOD PRESSURE: "What is the blood pressure?" "Did you take at least two measurements 5 minutes apart?"    144/104 2. ONSET: "When did you take your blood pressure?"     now 3. HOW: "How did you obtain the blood pressure?" (e.g., visiting nurse, automatic home BP monitor)     Automatic home bp monitor 4. HISTORY: "Do you have a history of high blood pressure?"     Caregiver Rebecca Bruce states yes. 5. MEDICATIONS: "Are you taking any medications for blood pressure?" "Have you missed any doses recently?"    Care giver reports patient has not missed medications 6. OTHER SYMPTOMS: "Do you have any symptoms?" (e.g., headache, chest pain, blurred vision, difficulty breathing, weakness)     no  Protocols used: BLOOD PRESSURE - HIGH-A-AH

## 2019-11-19 NOTE — Discharge Instructions (Addendum)
Decrease your salt intake. diet and exercise will lower your blood pressure significantly. It is important to keep your blood pressure under good control, as having a elevated blood pressure for prolonged periods of time significantly increases your risk of stroke, heart attacks, kidney damage, eye damage, and other problems. Measure your blood pressure once a day, preferably at the same time every day. Keep a log of this and bring it to your next doctor's appointment.  Bring your blood pressure cuff as well.   Return immediately to the ER if you start having chest pain, headache, problems seeing, problems talking, problems walking, if you feel like you're about to pass out, if you do pass out, if you have a seizure, or for any other concerns.  Go to www.goodrx.com to look up your medications. This will give you a list of where you can find your prescriptions at the most affordable prices. Or ask the pharmacist what the cash price is, or if they have any other discount programs available to help make your medication more affordable. This can be less expensive than what you would pay with insurance.

## 2019-11-19 NOTE — ED Triage Notes (Signed)
CG states patient has been having elevated BP this week. Today she reports BP 156/92, 139/99 and 140/97. Patient is not currently on any medication for BP.

## 2019-11-19 NOTE — ED Provider Notes (Signed)
HPI  SUBJECTIVE:  Rebecca Bruce is a 59 y.o. female who presents with her guardian from her group home with elevated blood pressure readings over the past month.  Guardian states that they check her blood pressure once a week, and she has noted that has been consistently elevated over the past month in the 150s/90s.  The highest it was today was 156 over 90s today.  Patient has no complaints.  She denies headaches, blurry vision, dizziness, chest pain or shortness of breath, palpitation, slurred speech, arm or leg weakness, facial droop, abdominal pain pain tearing through to her back, hematuria, anuria, lower extremity edema, syncope, seizures.  There has been no change in her baseline mental status per guardian.  Patient is not on any decongestants. Per chart she has a diagnosis of hypertension but is not on any blood pressure medicines currently.  She is on Xarelto for DVT.  She has a history of hypothyroidism.  No history of diabetes [id number], [name], MD   Past Medical History:  Diagnosis Date  . GERD (gastroesophageal reflux disease)   . Hypertension   . Thyroid disease     Past Surgical History:  Procedure Laterality Date  . DENTAL SURGERY    . IR ANGIOGRAM SELECTIVE EACH ADDITIONAL VESSEL  03/29/2019  . IR ANGIOGRAM SELECTIVE EACH ADDITIONAL VESSEL  03/29/2019  . IR ANGIOGRAM SELECTIVE EACH ADDITIONAL VESSEL  03/29/2019  . IR ANGIOGRAM SELECTIVE EACH ADDITIONAL VESSEL  03/29/2019  . IR ANGIOGRAM VISCERAL SELECTIVE  03/29/2019  . IR ANGIOGRAM VISCERAL SELECTIVE  03/29/2019  . IR US GUIDE VASC ACCESS RIGHT  03/29/2019    Family History  Problem Relation Age of Onset  . Heart disease Mother   . Breast cancer Neg Hx     Social History   Tobacco Use  . Smoking status: Never Smoker  . Smokeless tobacco: Never Used  . Tobacco comment: smoking cessation materials not required  Vaping Use  . Vaping Use: Never used  Substance Use Topics  . Alcohol use: No     Alcohol/week: 0.0 standard drinks  . Drug use: No    No current facility-administered medications for this encounter.  Current Outpatient Medications:  .  acetaminophen (TYLENOL) 325 MG tablet, Take 1-2 tablets (325-650 mg total) by mouth every 4 (four) hours as needed for mild pain., Disp:  , Rfl:  .  amLODipine (NORVASC) 2.5 MG tablet, Take 1 tablet (2.5 mg total) by mouth daily., Disp: 30 tablet, Rfl: 0 .  ascorbic acid (VITAMIN C) 500 MG/5ML syrup, Take 5 mLs (500 mg total) by mouth daily., Disp: 473 mL, Rfl: 12 .  atorvastatin (LIPITOR) 20 MG tablet, Take 1 tablet (20 mg total) by mouth daily at 6 PM., Disp: 30 tablet, Rfl: 0 .  gabapentin (NEURONTIN) 300 MG capsule, Take 1 capsule (300 mg total) by mouth at bedtime., Disp: 900 capsule, Rfl: 1 .  hydrocerin (EUCERIN) CREA, Apply 1 application topically daily. To bilateral feet, Disp: , Rfl: 0 .  Ipratropium-Albuterol (COMBIVENT) 20-100 MCG/ACT AERS respimat, Inhale 1 puff into the lungs every 6 (six) hours as needed for wheezing., Disp: 4 g, Rfl: 0 .  levothyroxine (SYNTHROID) 100 MCG tablet, Take 1 tablet (100 mcg total) by mouth daily before breakfast., Disp: 30 tablet, Rfl: 2 .  LORazepam (ATIVAN) 0.5 MG tablet, Take 0.5 mg by mouth daily as needed for anxiety. Can take an extra pill 30 minutes after the first dose if needed, Disp: , Rfl:  .  Multiple Vitamin (  MULTIVITAMIN WITH MINERALS) TABS tablet, Take 1 tablet by mouth daily., Disp:  , Rfl:  .  Multiple Vitamins-Minerals (THEREMS-M) TABS, Take 1 tablet by mouth daily., Disp: 30 tablet, Rfl: 5 .  PARoxetine (PAXIL) 30 MG tablet, Take 1 tablet (30 mg total) by mouth at bedtime., Disp: 30 tablet, Rfl: 2 .  potassium chloride (KLOR-CON) 20 MEQ packet, Take 20 mEq by mouth 2 (two) times daily., Disp: 60 packet, Rfl: 0 .  Probiotic Product (UP4 PROBIOTICS ADULT) CAPS, Take 250 mg by mouth daily., Disp: 30 capsule, Rfl: 3 .  rivaroxaban (XARELTO) 20 MG TABS tablet, Take 1 tablet (20 mg  total) by mouth daily with supper., Disp: 30 tablet, Rfl: 1 .  saccharomyces boulardii (FLORASTOR) 250 MG capsule, Take 1 capsule (250 mg total) by mouth daily., Disp: 30 capsule, Rfl: 0 .  VIACTIV CALCIUM PLUS D 650-12.5-40 MG-MCG CHEW, CHEW AND SWALLOW ONE PIECE TWICE DAILY. (SUPPLEMENT) CARAMEL (Patient taking differently: Chew 1 Dose by mouth 2 (two) times daily. ), Disp: 60 tablet, Rfl: 12 .  zinc sulfate 220 (50 Zn) MG capsule, Take 1 capsule (220 mg total) by mouth daily., Disp: , Rfl:   No Known Allergies   ROS  As noted in HPI.   Physical Exam  BP (!) 169/102 (BP Location: Right Arm)   Pulse 88   Temp 98.1 F (36.7 C) (Oral)   Resp 18   Ht 5\' 5"  (1.651 m)   Wt 78.5 kg   SpO2 100%   BMI 28.80 kg/m    BP Readings from Last 3 Encounters:  11/19/19 (!) 169/102  11/15/19 (!) 160/100  07/15/19 132/78    Constitutional: Well developed, well nourished, no acute distress Eyes:  EOMI, conjunctiva normal bilaterally HENT: Normocephalic, atraumatic,mucus membranes moist Respiratory: Normal inspiratory effort lungs clear bilaterally Cardiovascular: Normal rate regular rhythm no murmurs rubs or gallops GI: nondistended skin: No rash, skin intact Musculoskeletal: Calves symmetric, nontender, no edema Neurologic: Alert, answers questions answers questions appropriately, at baseline mental status per guardian, no focal neuro deficits Psychiatric: Speech and behavior appropriate   ED Course   Medications - No data to display  No orders of the defined types were placed in this encounter.   No results found for this or any previous visit (from the past 24 hour(s)). No results found.  ED Clinical Impression  1. Essential hypertension      ED Assessment/Plan  patient has no complaints.  Will start on low-dose amlodipine.  Will have guardian measure blood pressure once a day preferably the same time every day and keep a log of this.  She is to take a log and her blood  pressure cuff with her to her follow-up appointment with her primary care physician in a week or 2.  Gave her ER return precautions.  Discussed MDM, treatment plan, and plan for follow-up with Guardian.. Discussed sn/sx that should prompt return to the ED. Guardian agrees with plan.   Meds ordered this encounter  Medications  . amLODipine (NORVASC) 2.5 MG tablet    Sig: Take 1 tablet (2.5 mg total) by mouth daily.    Dispense:  30 tablet    Refill:  0    *This clinic note was created using [employer]. Therefore, there may be occasional mistakes despite careful proofreading.   ?    Melynda Ripple, MD 11/20/19 [id number]

## 2019-11-20 NOTE — Progress Notes (Signed)
I have not done anything to it.

## 2019-11-21 NOTE — Telephone Encounter (Signed)
I tried calling group home to get an appt scheduled, there was no answer and no VM to leave a message. I also called her secondary number and her brother told me to call the group home. He said he will also let them know.

## 2019-11-22 NOTE — Telephone Encounter (Signed)
Called caregiver. They verbalized understanding. Transferred call to front to schedule follow up appointment.

## 2019-11-25 ENCOUNTER — Ambulatory Visit: Payer: Medicare Other | Admitting: Family Medicine

## 2019-11-26 ENCOUNTER — Ambulatory Visit: Payer: Medicare Other | Attending: Family Medicine

## 2019-11-27 ENCOUNTER — Telehealth: Payer: Self-pay

## 2019-11-27 NOTE — Telephone Encounter (Signed)
Called group home. Appointment with you has been rescheduled and she refused to go in when they got to the appointment about her ultrasound.

## 2019-11-27 NOTE — Telephone Encounter (Signed)
Copied from [place] [id number]. Topic: General - Other >> Nov 26, 2019  3:44 PM Leward Quan A wrote: Reason for CRM: Maudie Mercury with Ultrasound called to inform Dr Ancil Boozer that patient did not show up for her 2.30 appointment for the doppler. Please advise

## 2019-11-27 NOTE — Progress Notes (Signed)
Name: Rebecca Bruce   MRN: [id number]    DOB: 03/18/1961   Date:11/28/2019       Progress Note  Subjective  Chief Complaint  Chief Complaint  Patient presents with  . Hypertension  . Urinary Tract Infection    HPI  Rebecca Bruce came in with her caregiver, she lives at a [facility] group home . She is more agitated than usual today. She is usually happy to see me , but was afraid to be examined and recently left the [place] department without allowing the technician check her for DVT, her bp has been elevated at the group home and I am concerned that she may be in pain or needs to see a psychiatrist to adjust medications and help with her anxiety/agitation  Knee : she does not complain of pain, but has antalgic gait and effusion on left knee, with some increase in warmth but no redness.   History of UTI : caregiver asked for ua since she has been agitated, no urine odor or increase in frequency     Patient Active Problem List   Diagnosis Date Noted  . Transaminitis   . Pressure injury of skin 03/28/2019  . History of COVID-19   . 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 Hyperglycemia 07/13/2018  . Pre-diabetes 01/05/2017  . Dyslipidemia 07/02/2015  . History of hyperthyroidism 07/02/2015  . History of radioactive iodine thyroid ablation 07/02/2015  . Hypothyroidism (acquired) 07/02/2015  . Hypertension, benign 07/02/2015  . Anxiety 07/02/2015  . Vitamin D deficiency 07/02/2015  . Insomnia 07/02/2015  . OA (osteoarthritis) of knee 07/02/2015  . GERD (gastroesophageal reflux disease) 07/02/2015    Past Surgical History:  Procedure Laterality Date  . DENTAL SURGERY    . IR ANGIOGRAM SELECTIVE EACH ADDITIONAL VESSEL  03/29/2019  . IR ANGIOGRAM SELECTIVE EACH ADDITIONAL VESSEL  03/29/2019  . IR ANGIOGRAM SELECTIVE EACH ADDITIONAL VESSEL  03/29/2019  . IR ANGIOGRAM SELECTIVE EACH ADDITIONAL VESSEL  03/29/2019  . IR ANGIOGRAM VISCERAL SELECTIVE  03/29/2019  . IR ANGIOGRAM  VISCERAL SELECTIVE  03/29/2019  . IR US GUIDE VASC ACCESS RIGHT  03/29/2019    Family History  Problem Relation Age of Onset  . Heart disease Mother   . Breast cancer Neg Hx     Social History   Tobacco Use  . Smoking status: Never Smoker  . Smokeless tobacco: Never Used  . Tobacco comment: smoking cessation materials not required  Substance Use Topics  . Alcohol use: No    Alcohol/week: 0.0 standard drinks     Current Outpatient Medications:  .  acetaminophen (TYLENOL) 325 MG tablet, Take 1-2 tablets (325-650 mg total) by mouth every 4 (four) hours as needed for mild pain., Disp:  , Rfl:  .  amLODipine (NORVASC) 2.5 MG tablet, Take 1 tablet (2.5 mg total) by mouth daily., Disp: 30 tablet, Rfl: 0 .  ascorbic acid (VITAMIN C) 500 MG/5ML syrup, Take 5 mLs (500 mg total) by mouth daily., Disp: 473 mL, Rfl: 12 .  atorvastatin (LIPITOR) 20 MG tablet, Take 1 tablet (20 mg total) by mouth daily at 6 PM., Disp: 30 tablet, Rfl: 0 .  gabapentin (NEURONTIN) 300 MG capsule, Take 1 capsule (300 mg total) by mouth at bedtime., Disp: 900 capsule, Rfl: 1 .  hydrocerin (EUCERIN) CREA, Apply 1 application topically daily. To bilateral feet, Disp: , Rfl: 0 .  Ipratropium-Albuterol (COMBIVENT) 20-100 MCG/ACT AERS respimat, Inhale 1 puff into the lungs every 6 (six) hours as needed for wheezing.,  Disp: 4 g, Rfl: 0 .  levothyroxine (SYNTHROID) 100 MCG tablet, Take 1 tablet (100 mcg total) by mouth daily before breakfast., Disp: 30 tablet, Rfl: 2 .  LORazepam (ATIVAN) 0.5 MG tablet, Take 0.5 mg by mouth daily as needed for anxiety. Can take an extra pill 30 minutes after the first dose if needed, Disp: , Rfl:  .  Multiple Vitamin (MULTIVITAMIN WITH MINERALS) TABS tablet, Take 1 tablet by mouth daily., Disp:  , Rfl:  .  Multiple Vitamins-Minerals (THEREMS-M) TABS, Take 1 tablet by mouth daily., Disp: 30 tablet, Rfl: 5 .  PARoxetine (PAXIL) 30 MG tablet, Take 1 tablet (30 mg total) by mouth at bedtime.,  Disp: 30 tablet, Rfl: 2 .  potassium chloride (KLOR-CON) 20 MEQ packet, Take 20 mEq by mouth 2 (two) times daily., Disp: 60 packet, Rfl: 0 .  Probiotic Product (UP4 PROBIOTICS ADULT) CAPS, Take 250 mg by mouth daily., Disp: 30 capsule, Rfl: 3 .  rivaroxaban (XARELTO) 20 MG TABS tablet, Take 1 tablet (20 mg total) by mouth daily with supper., Disp: 30 tablet, Rfl: 1 .  saccharomyces boulardii (FLORASTOR) 250 MG capsule, Take 1 capsule (250 mg total) by mouth daily., Disp: 30 capsule, Rfl: 0 .  VIACTIV CALCIUM PLUS D 650-12.5-40 MG-MCG CHEW, CHEW AND SWALLOW ONE PIECE TWICE DAILY. (SUPPLEMENT) CARAMEL (Patient taking differently: Chew 1 Dose by mouth 2 (two) times daily. ), Disp: 60 tablet, Rfl: 12 .  zinc sulfate 220 (50 Zn) MG capsule, Take 1 capsule (220 mg total) by mouth daily., Disp: , Rfl:   No Known Allergies  I personally reviewed active problem list, medication list, allergies, family history, social history, health maintenance with the patient/caregiver today.   ROS  Ten systems reviewed and is negative except as mentioned in HPI   Objective  Vitals:   11/28/19 1014  BP: (!) 142/94  Pulse: 93  Resp: 18  Temp: 98.2 F (36.8 C)  TempSrc: Oral  SpO2: 97%  Weight: 171 lb 4.8 oz (77.7 kg)  Height: 5\' 5"  (1.651 m)    Body mass index is 28.51 kg/m.  Physical Exam  Constitutional: Patient appears well-developed and well-nourished.   HEENT: head atraumatic, normocephalic, pupils equal and reactive to light,neck supple Cardiovascular: Normal rate, regular rhythm and normal heart sounds.  No murmur heard. No BLE edema. Pulmonary/Chest: Effort normal and breath sounds normal. No respiratory distress. Abdominal: Soft.  There is no tenderness. Muscular skeletal: effusion of left knee, using a walker, pain with extension and antalgic gait, mild increase in warmth but no redness  Psychiatric: Patient seems agitated, but able to cooperate when asked, refused labs and could not do a  good knee exam   Recent Results (from the past 2160 hour(s))  POCT Urinalysis Dip Manual     Status: Abnormal   Collection Time: 11/28/19 10:52 AM  Result Value Ref Range   Spec Grav, UA 1.010 1.010 - 1.025   pH, UA 8.0 5.0 - 8.0   Leukocytes, UA Negative Negative   Nitrite, UA Negative Negative   Poct Protein Negative Negative, trace mg/dL   Poct Glucose Normal Normal mg/dL   Poct Ketones Negative Negative   Poct Urobilinogen Normal Normal mg/dL   Poct Bilirubin Negative Negative   Poct Blood trace Negative, trace      PHQ2/9: Depression screen [facility] 2/9 11/28/2019 11/15/2019 07/15/2019 06/13/2019 06/07/2019  Decreased Interest 0 0 0 0 0  Down, Depressed, Hopeless 0 0 0 0 0  PHQ - 2 Score 0  0 0 0 0  Altered sleeping 0 0 0 0 0  Tired, decreased energy 0 0 0 0 0  Change in appetite 0 0 0 0 0  Feeling bad or failure about yourself  0 0 0 - 0  Trouble concentrating 0 0 0 - 0  Moving slowly or fidgety/restless 0 0 0 - 0  Suicidal thoughts 0 0 0 - 0  PHQ-9 Score 0 0 0 0 0  Difficult doing work/chores - - Not difficult at all - Not difficult at all  Some recent data might be hidden    phq 9 is negative   Fall Risk: Fall Risk  11/28/2019 11/15/2019 11/15/2019 07/15/2019 06/13/2019  Falls in the past year? 0 0 0 0 0  Number falls in past yr: 0 0 0 0 0  Injury with Fall? 0 0 0 0 0  Risk for fall due to : - - - - -  Risk for fall due to: Comment - - - - -  Follow up - - - - -     Functional Status Survey: Is the patient deaf or have difficulty hearing?: No Does the patient have difficulty seeing, even when wearing glasses/contacts?: No Does the patient have difficulty concentrating, remembering, or making decisions?: Yes Does the patient have difficulty walking or climbing stairs?: Yes Does the patient have difficulty dressing or bathing?: No Does the patient have difficulty doing errands alone such as visiting a doctor's office or shopping?: Yes    Assessment & Plan  1. History of  UTI  - POCT Urinalysis Dip Manual - Urine Culture  2. Dysuria  - POCT Urinalysis Dip Manual - Urine Culture  3. Hypothyroidism (acquired)  - TSH; Future  4. Hypertension, benign  - amLODipine (NORVASC) 5 MG tablet; Take 1 tablet (5 mg total) by mouth daily.  Dispense: 30 tablet; Refill: 0  5. Effusion of left knee  - Uric acid - predniSONE (DELTASONE) 10 MG tablet; Take 1 tablet (10 mg total) by mouth 2 (two) times daily with a meal.  Dispense: 10 tablet; Refill: 0  6. Agitation  She may be in pain , she also has been apprehensive towards health care workers and facilities since admission back in 02/2019   Discussed referral to psychiatrist, and caregiver will discuss it with her son

## 2019-11-28 ENCOUNTER — Other Ambulatory Visit: Payer: Self-pay

## 2019-11-28 ENCOUNTER — Telehealth: Payer: Self-pay | Admitting: Family Medicine

## 2019-11-28 ENCOUNTER — Other Ambulatory Visit: Payer: Self-pay | Admitting: Family Medicine

## 2019-11-28 ENCOUNTER — Encounter: Payer: Self-pay | Admitting: Family Medicine

## 2019-11-28 ENCOUNTER — Ambulatory Visit (INDEPENDENT_AMBULATORY_CARE_PROVIDER_SITE_OTHER): Payer: Medicare Other | Admitting: Family Medicine

## 2019-11-28 VITALS — BP 142/94 | HR 93 | Temp 98.2°F | Resp 18 | Ht 65.0 in | Wt 171.3 lb

## 2019-11-28 DIAGNOSIS — R451 Restlessness and agitation: Secondary | ICD-10-CM | POA: Diagnosis not present

## 2019-11-28 DIAGNOSIS — M25462 Effusion, left knee: Secondary | ICD-10-CM | POA: Diagnosis not present

## 2019-11-28 DIAGNOSIS — R300 Dysuria: Secondary | ICD-10-CM | POA: Diagnosis not present

## 2019-11-28 DIAGNOSIS — Z87440 Personal history of urinary (tract) infections: Secondary | ICD-10-CM | POA: Diagnosis not present

## 2019-11-28 DIAGNOSIS — E039 Hypothyroidism, unspecified: Secondary | ICD-10-CM

## 2019-11-28 DIAGNOSIS — I10 Essential (primary) hypertension: Secondary | ICD-10-CM | POA: Diagnosis not present

## 2019-11-28 LAB — POCT URINALYSIS DIPSTICK (MANUAL)
Leukocytes, UA: NEGATIVE
Nitrite, UA: NEGATIVE
Poct Bilirubin: NEGATIVE
Poct Glucose: NORMAL mg/dL
Poct Ketones: NEGATIVE
Poct Protein: NEGATIVE mg/dL
Poct Urobilinogen: NORMAL mg/dL
Spec Grav, UA: 1.01 (ref 1.010–1.025)
pH, UA: 8 (ref 5.0–8.0)

## 2019-11-28 LAB — URIC ACID: Uric Acid, Serum: 3.5 mg/dL (ref 2.5–7.0)

## 2019-11-28 MED ORDER — PREDNISONE 10 MG PO TABS: 10.0000 mg | ORAL_TABLET | Freq: Two times a day (BID) | ORAL | 0 refills | Status: DC

## 2019-11-28 MED ORDER — AMLODIPINE BESYLATE 5 MG PO TABS: 5.0000 mg | ORAL_TABLET | Freq: Every day | ORAL | 0 refills | Status: DC

## 2019-11-28 NOTE — Telephone Encounter (Signed)
Called to request a D/C order for patient's medication for amLODipine (NORVASC) 5 MG tablet.  She stated that they needed they needed the order for the 2.5 MG. Please advise and call to discuss at [phone] or it can be faxed at [phone]

## 2019-12-02 LAB — URINE CULTURE
MICRO NUMBER:: 10847720
SPECIMEN QUALITY:: ADEQUATE

## 2019-12-02 LAB — HOUSE ACCOUNT TRACKING

## 2019-12-02 NOTE — Telephone Encounter (Unsigned)
Copied from [place] [id number]. Topic: General - Call Back - No Documentation >> Nov 28, 2019  1:03 PM Erick Blinks wrote: Doylene Bode is requesting a call back regarding the orders that PCP sent, they have questions.  [phone]

## 2019-12-02 NOTE — Telephone Encounter (Signed)
I contacted the group home to notify them of medication changing to 5 mg. He told me he would take care of it.

## 2019-12-25 DIAGNOSIS — Z23 Encounter for immunization: Secondary | ICD-10-CM | POA: Diagnosis not present

## 2020-01-27 DIAGNOSIS — M79675 Pain in left toe(s): Secondary | ICD-10-CM | POA: Diagnosis not present

## 2020-01-27 DIAGNOSIS — L600 Ingrowing nail: Secondary | ICD-10-CM | POA: Diagnosis not present

## 2020-01-27 DIAGNOSIS — B351 Tinea unguium: Secondary | ICD-10-CM | POA: Diagnosis not present

## 2020-01-27 DIAGNOSIS — M79674 Pain in right toe(s): Secondary | ICD-10-CM | POA: Diagnosis not present

## 2020-02-07 DIAGNOSIS — H5213 Myopia, bilateral: Secondary | ICD-10-CM | POA: Diagnosis not present

## 2020-02-17 DIAGNOSIS — Z23 Encounter for immunization: Secondary | ICD-10-CM | POA: Diagnosis not present

## 2020-02-18 DIAGNOSIS — F339 Major depressive disorder, recurrent, unspecified: Secondary | ICD-10-CM | POA: Diagnosis not present

## 2020-05-19 DIAGNOSIS — F339 Major depressive disorder, recurrent, unspecified: Secondary | ICD-10-CM | POA: Diagnosis not present

## 2020-05-21 NOTE — Progress Notes (Deleted)
Name: Rebecca Bruce   MRN: [id number]    DOB: [birth year]   Date:05/21/2020       Progress Note  Subjective  Chief Complaint  Follow Up  HPI  [name] came in with her caregiver, she lives at a [place] group home . She is more agitated than usual today. She is usually happy to see me , but was afraid to be examined and recently left the [place] department without allowing the technician check her for DVT, her bp has been elevated at the group home and I am concerned that she may be in pain or needs to see a psychiatrist to adjust medications and help with her anxiety/agitation  Knee : she does not complain of pain, but has antalgic gait and effusion on left knee, with some increase in warmth but no redness.   History of UTI : caregiver asked for ua since she has been agitated, no urine odor or increase in frequency  Patient Active Problem List   Diagnosis Date Noted  . Transaminitis   . Pressure injury of skin 03/28/2019  . History of COVID-19   . 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 Hyperglycemia 07/13/2018  . Pre-diabetes 01/05/2017  . Dyslipidemia 07/02/2015  . History of hyperthyroidism 07/02/2015  . History of radioactive iodine thyroid ablation 07/02/2015  . Hypothyroidism (acquired) 07/02/2015  . Hypertension, benign 07/02/2015  . Anxiety 07/02/2015  . Vitamin D deficiency 07/02/2015  . Insomnia 07/02/2015  . OA (osteoarthritis) of knee 07/02/2015  . GERD (gastroesophageal reflux disease) 07/02/2015    Past Surgical History:  Procedure Laterality Date  . DENTAL SURGERY    . IR ANGIOGRAM SELECTIVE EACH ADDITIONAL VESSEL  03/29/2019  . IR ANGIOGRAM SELECTIVE EACH ADDITIONAL VESSEL  03/29/2019  . IR ANGIOGRAM SELECTIVE EACH ADDITIONAL VESSEL  03/29/2019  . IR ANGIOGRAM SELECTIVE EACH ADDITIONAL VESSEL  03/29/2019  . IR ANGIOGRAM VISCERAL SELECTIVE  03/29/2019  . IR ANGIOGRAM VISCERAL SELECTIVE  03/29/2019  . IR US GUIDE VASC ACCESS RIGHT  03/29/2019     Family History  Problem Relation Age of Onset  . Heart disease Mother   . Breast cancer Neg Hx     Social History   Tobacco Use  . Smoking status: Never Smoker  . Smokeless tobacco: Never Used  . Tobacco comment: smoking cessation materials not required  Substance Use Topics  . Alcohol use: No    Alcohol/week: 0.0 standard drinks     Current Outpatient Medications:  .  acetaminophen (TYLENOL) 325 MG tablet, Take 1-2 tablets (325-650 mg total) by mouth every 4 (four) hours as needed for mild pain., Disp:  , Rfl:  .  amLODipine (NORVASC) 5 MG tablet, Take 1 tablet (5 mg total) by mouth daily., Disp: 30 tablet, Rfl: 0 .  ascorbic acid (VITAMIN C) 500 MG/5ML syrup, Take 5 mLs (500 mg total) by mouth daily., Disp: 473 mL, Rfl: 12 .  atorvastatin (LIPITOR) 20 MG tablet, Take 1 tablet (20 mg total) by mouth daily at 6 PM., Disp: 30 tablet, Rfl: 0 .  gabapentin (NEURONTIN) 300 MG capsule, Take 1 capsule (300 mg total) by mouth at bedtime., Disp: 900 capsule, Rfl: 1 .  hydrocerin (EUCERIN) CREA, Apply 1 application topically daily. To bilateral feet, Disp: , Rfl: 0 .  Ipratropium-Albuterol (COMBIVENT) 20-100 MCG/ACT AERS respimat, Inhale 1 puff into the lungs every 6 (six) hours as needed for wheezing., Disp: 4 g, Rfl: 0 .  levothyroxine (SYNTHROID) 100 MCG tablet, Take 1 tablet (100 mcg  total) by mouth daily before breakfast., Disp: 30 tablet, Rfl: 2 .  LORazepam (ATIVAN) 0.5 MG tablet, Take 0.5 mg by mouth daily as needed for anxiety. Can take an extra pill 30 minutes after the first dose if needed, Disp: , Rfl:  .  Multiple Vitamin (MULTIVITAMIN WITH MINERALS) TABS tablet, Take 1 tablet by mouth daily., Disp:  , Rfl:  .  Multiple Vitamins-Minerals (THEREMS-M) TABS, Take 1 tablet by mouth daily., Disp: 30 tablet, Rfl: 5 .  PARoxetine (PAXIL) 30 MG tablet, Take 1 tablet (30 mg total) by mouth at bedtime., Disp: 30 tablet, Rfl: 2 .  potassium chloride (KLOR-CON) 20 MEQ packet, Take 20 mEq  by mouth 2 (two) times daily., Disp: 60 packet, Rfl: 0 .  predniSONE (DELTASONE) 10 MG tablet, Take 1 tablet (10 mg total) by mouth 2 (two) times daily with a meal., Disp: 10 tablet, Rfl: 0 .  Probiotic Product (UP4 PROBIOTICS ADULT) CAPS, Take 250 mg by mouth daily., Disp: 30 capsule, Rfl: 3 .  rivaroxaban (XARELTO) 20 MG TABS tablet, Take 1 tablet (20 mg total) by mouth daily with supper., Disp: 30 tablet, Rfl: 1 .  saccharomyces boulardii (FLORASTOR) 250 MG capsule, Take 1 capsule (250 mg total) by mouth daily., Disp: 30 capsule, Rfl: 0 .  VIACTIV CALCIUM PLUS D 650-12.5-40 MG-MCG CHEW, CHEW AND SWALLOW ONE PIECE TWICE DAILY. (SUPPLEMENT) CARAMEL (Patient taking differently: Chew 1 Dose by mouth 2 (two) times daily. ), Disp: 60 tablet, Rfl: 12 .  zinc sulfate 220 (50 Zn) MG capsule, Take 1 capsule (220 mg total) by mouth daily., Disp: , Rfl:   No Known Allergies  I personally reviewed {Reviewed:[id number]} with the patient/caregiver today.   ROS  ***  Objective  There were no vitals filed for this visit.  There is no height or weight on file to calculate BMI.  Physical Exam ***  No results found for this or any previous visit (from the past 2160 hour(s)).  Diabetic Foot Exam: Diabetic Foot Exam - Simple   No data filed    ***  PHQ2/9: Depression screen [facility] 2/9 11/28/2019 11/15/2019 07/15/2019 06/13/2019 06/07/2019  Decreased Interest 0 0 0 0 0  Down, Depressed, Hopeless 0 0 0 0 0  PHQ - 2 Score 0 0 0 0 0  Altered sleeping 0 0 0 0 0  Tired, decreased energy 0 0 0 0 0  Change in appetite 0 0 0 0 0  Feeling bad or failure about yourself  0 0 0 - 0  Trouble concentrating 0 0 0 - 0  Moving slowly or fidgety/restless 0 0 0 - 0  Suicidal thoughts 0 0 0 - 0  PHQ-9 Score 0 0 0 0 0  Difficult doing work/chores - - Not difficult at all - Not difficult at all  Some recent data might be hidden    phq 9 is {gen pos [id number]} ***  Fall Risk: Fall Risk  11/28/2019 11/15/2019 11/15/2019  07/15/2019 06/13/2019  Falls in the past year? 0 0 0 0 0  Number falls in past yr: 0 0 0 0 0  Injury with Fall? 0 0 0 0 0  Risk for fall due to : - - - - -  Risk for fall due to: Comment - - - - -  Follow up - - - - -   ***   Functional Status Survey:   ***   Assessment & Plan  *** There are no diagnoses linked to this encounter.

## 2020-05-22 ENCOUNTER — Ambulatory Visit: Payer: Medicare Other | Admitting: Family Medicine

## 2020-07-02 NOTE — Progress Notes (Signed)
Name: Rebecca Bruce   MRN: [id number]    DOB: 04/11/1961   Date:07/03/2020       Progress Note  Subjective  Chief Complaint  Follow Up  HPI  HTN: taking medications, no side effects, denies chest pain or palpitation. She came in today with Rebecca Bruce, she lives at [facility]   Hyperlipidemia: taking medication as prescribed and no problems, denies myalgias. We will recheck labs   Hypothyroidism :used to seeDr. Rosario Bruce, s/p ablation for hyperthyroidism, doing well on Synthroid 100 mcg daily, we will recheck labs today   MR: stable, sees psychiatrist, Dr. 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 Transaminitis   . Pressure injury of skin 03/28/2019  . History of COVID-19   . 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 Hyperglycemia 07/13/2018  . Pre-diabetes 01/05/2017  . Dyslipidemia 07/02/2015  . History of hyperthyroidism 07/02/2015  . History of radioactive iodine thyroid ablation 07/02/2015  . Hypothyroidism (acquired) 07/02/2015  . Hypertension, benign 07/02/2015  . Anxiety 07/02/2015  . Vitamin D deficiency 07/02/2015  . Insomnia 07/02/2015  . OA (osteoarthritis) of knee 07/02/2015  . GERD (gastroesophageal reflux disease) 07/02/2015    Past Surgical History:  Procedure Laterality Date  . DENTAL SURGERY    . IR ANGIOGRAM SELECTIVE EACH ADDITIONAL VESSEL  03/29/2019  . IR  ANGIOGRAM SELECTIVE EACH ADDITIONAL VESSEL  03/29/2019  . IR ANGIOGRAM SELECTIVE EACH ADDITIONAL VESSEL  03/29/2019  . IR ANGIOGRAM SELECTIVE EACH ADDITIONAL VESSEL  03/29/2019  . IR ANGIOGRAM VISCERAL SELECTIVE  03/29/2019  . IR ANGIOGRAM VISCERAL SELECTIVE  03/29/2019  . IR US GUIDE VASC ACCESS RIGHT  03/29/2019    Family History  Problem Relation Age of Onset  . Heart disease Mother   . Breast cancer Neg Hx     Social History   Tobacco Use  . Smoking status: Never Smoker  . Smokeless tobacco: Never Used  . Tobacco comment: smoking cessation materials not required  Substance Use Topics  . Alcohol use: No    Alcohol/week: 0.0 standard drinks     Current Outpatient Medications:  .  acetaminophen (TYLENOL) 325 MG tablet, Take 1-2 tablets (325-650 mg total) by mouth every 4 (four) hours as needed for mild pain., Disp:  , Rfl:  .  ascorbic acid (VITAMIN C) 500 MG/5ML syrup, Take 5 mLs (500 mg total) by mouth daily., Disp: 473 mL, Rfl: 12 .  fluconazole (DIFLUCAN) 150 MG tablet, Take 1 tablet (150 mg total) by mouth every other day., Disp: 3 tablet, Rfl: 0 .  gabapentin (NEURONTIN) 300 MG capsule, Take 1 capsule (300 mg total) by mouth at bedtime., Disp: 900 capsule, Rfl: 1 .  hydrocerin (EUCERIN) CREA, Apply 1 application topically daily. To bilateral feet, Disp: , Rfl: 0 .  Ipratropium-Albuterol (COMBIVENT) 20-100 MCG/ACT AERS respimat, Inhale 1 puff into the lungs every 6 (six) hours as needed for wheezing., Disp: 4 g, Rfl: 0 .  levothyroxine (SYNTHROID) 100 MCG tablet, Take 1 tablet (100 mcg total) by mouth daily before breakfast., Disp: 30 tablet, Rfl: 2 .  LORazepam (ATIVAN) 0.5 MG tablet, Take 0.5 mg by mouth daily as needed for anxiety. Can take an extra pill 30 minutes after the first dose if needed, Disp: , Rfl:  .  Multiple Vitamin (MULTIVITAMIN WITH MINERALS) TABS tablet, Take 1 tablet by mouth daily., Disp:  , Rfl:  .  Multiple Vitamins-Minerals (THEREMS-M) TABS, Take 1  tablet by mouth daily., Disp: 30 tablet, Rfl: 5 .  Nystatin (Rebecca Bruce'S BUTT CREAM) CREA, Apply 1 application topically 4 (four) times daily as needed for irritation. On abdominal fold, intertrigo, Disp: 1 each, Rfl: 1 .  PARoxetine (PAXIL) 30 MG tablet, Take 1 tablet (30 mg total) by mouth at bedtime., Disp: 30 tablet, Rfl: 2 .  potassium chloride (KLOR-CON) 20 MEQ packet, Take 20 mEq by mouth 2 (two) times daily., Disp: 60 packet, Rfl: 0 .  Probiotic Product (UP4 PROBIOTICS ADULT) CAPS, Take 250 mg by mouth daily., Disp: 30 capsule, Rfl: 3 .  saccharomyces boulardii (FLORASTOR) 250 MG capsule, Take 1 capsule (250 mg total) by mouth daily., Disp: 30 capsule, Rfl: 0 .  VIACTIV CALCIUM PLUS D 650-12.5-40 MG-MCG CHEW, CHEW AND SWALLOW ONE PIECE TWICE DAILY. (SUPPLEMENT) CARAMEL (Patient taking differently: Chew 1 Dose by mouth 2 (two) times daily.), Disp: 60 tablet, Rfl: 12 .  zinc sulfate 220 (50 Zn) MG capsule, Take 1 capsule (220 mg total) by mouth daily., Disp: , Rfl:  .  amLODipine (NORVASC) 5 MG tablet, Take 1 tablet (5 mg total) by mouth daily., Disp: 30 tablet, Rfl: 5 .  atorvastatin (LIPITOR) 20 MG tablet, Take 1 tablet (20 mg total) by mouth daily at 6 PM., Disp: 30 tablet, Rfl: 5  No Known Allergies  I personally reviewed active problem list, medication list, allergies, family history, social history, health maintenance with the patient/caregiver today.   ROS  Constitutional: Negative for fever or weight change.  Respiratory: Negative for cough and shortness of breath.   Cardiovascular: Negative for chest pain or palpitations.  Gastrointestinal: Negative for abdominal pain, no bowel changes.  Musculoskeletal: positive  for gait problem but no  joint swelling.  Skin: positive  for rash.  Neurological: Negative for dizziness or headache.  No other specific complaints in a complete review of systems (except as listed in HPI above).  Objective  Vitals:   07/03/20 0856  BP: 138/90   Pulse: 90  Resp: 16  Temp: 98.2 F (36.8 C)  TempSrc: Oral  SpO2: 99%  Weight: 180 lb (81.6 kg)  Height: 5\' 5"  (1.651 m)    Body mass index is 29.95 kg/m.  Physical Exam  Constitutional: Patient appears well-developed and well-nourished. No distress.  HEENT: head atraumatic, normocephalic, pupils equal and reactive to light,  neck supple Cardiovascular: Normal rate, regular rhythm and normal heart sounds.  No murmur heard. No BLE edema. Pulmonary/Chest: Effort normal and breath sounds normal. No respiratory distress. Abdominal: Soft.  There is no tenderness. Skin: small ulceration on abdominal fold, some erythema and hyperpigmentation, no pain during palpation, area of ulceration about a pea size on left side  Psychiatric: Patient has a normal mood and affect. behavior is normal. Judgment and thought content normal.  PHQ2/9: Depression screen [facility] 2/9 07/03/2020 11/28/2019 11/15/2019 07/15/2019 06/13/2019  Decreased Interest 0 0 0 0 0  Down, Depressed, Hopeless 0 0 0 0 0  PHQ - 2 Score 0 0 0 0  0  Altered sleeping - 0 0 0 0  Tired, decreased energy - 0 0 0 0  Change in appetite - 0 0 0 0  Feeling bad or failure about yourself  - 0 0 0 -  Trouble concentrating - 0 0 0 -  Moving slowly or fidgety/restless - 0 0 0 -  Suicidal thoughts - 0 0 0 -  PHQ-9 Score - 0 0 0 0  Difficult doing work/chores - - - Not difficult at all -  Some recent data might be hidden    phq 9 is negative   Fall Risk: Fall Risk  07/03/2020 11/28/2019 11/15/2019 11/15/2019 07/15/2019  Falls in the past year? 0 0 0 0 0  Number falls in past yr: 0 0 0 0 0  Injury with Fall? 0 0 0 0 0  Risk for fall due to : - - - - -  Risk for fall due to: Comment - - - - -  Follow up - - - - -     Functional Status Survey: Is the patient deaf or have difficulty hearing?: No Does the patient have difficulty seeing, even when wearing glasses/contacts?: No Does the patient have difficulty concentrating, remembering, or making  decisions?: No Does the patient have difficulty walking or climbing stairs?: No Does the patient have difficulty dressing or bathing?: No Does the patient have difficulty doing errands alone such as visiting a doctor's office or shopping?: No    Assessment & Plan  1. Pre-diabetes  - Hemoglobin A1c  2. Hypothyroidism (acquired)  - TSH  3. Mild intellectual disabilities   4. Vitamin D deficiency  - VITAMIN D 25 Hydroxy (Vit-D Deficiency, Fractures)  5. Atherosclerosis of aorta ([place])  On statin therapy   6. Dyslipidemia  - Lipid panel  7. History of deep venous thrombosis (DVT) of distal vein of right lower extremity  Stop Xarelto   8. Intertriginous skin ulcer, limited to breakdown of skin (HCC)  - fluconazole (DIFLUCAN) 150 MG tablet; Take 1 tablet (150 mg total) by mouth every other day.  Dispense: 3 tablet; Refill: 0 - Nystatin (Rebecca Bruce'S BUTT CREAM) CREA; Apply 1 application topically 4 (four) times daily as needed for irritation. On abdominal fold, intertrigo  Dispense: 1 each; Refill: 1  9. Hyperglycemia  - Hemoglobin A1c  10. History of anemia  - CBC with Differential/Platelet  11. Hypertension, benign  - COMPLETE METABOLIC PANEL WITH GFR.

## 2020-07-03 ENCOUNTER — Encounter: Payer: Self-pay | Admitting: Family Medicine

## 2020-07-03 ENCOUNTER — Ambulatory Visit (INDEPENDENT_AMBULATORY_CARE_PROVIDER_SITE_OTHER): Payer: Medicare Other | Admitting: Family Medicine

## 2020-07-03 ENCOUNTER — Other Ambulatory Visit: Payer: Self-pay

## 2020-07-03 VITALS — BP 138/90 | HR 90 | Temp 98.2°F | Resp 16 | Ht 65.0 in | Wt 180.0 lb

## 2020-07-03 DIAGNOSIS — F70 Mild intellectual disabilities: Secondary | ICD-10-CM | POA: Diagnosis not present

## 2020-07-03 DIAGNOSIS — E039 Hypothyroidism, unspecified: Secondary | ICD-10-CM | POA: Diagnosis not present

## 2020-07-03 DIAGNOSIS — I700 Atherosclerosis of aorta: Secondary | ICD-10-CM

## 2020-07-03 DIAGNOSIS — E785 Hyperlipidemia, unspecified: Secondary | ICD-10-CM

## 2020-07-03 DIAGNOSIS — R7303 Prediabetes: Secondary | ICD-10-CM | POA: Diagnosis not present

## 2020-07-03 DIAGNOSIS — L98491 Non-pressure chronic ulcer of skin of other sites limited to breakdown of skin: Secondary | ICD-10-CM | POA: Diagnosis not present

## 2020-07-03 DIAGNOSIS — Z86718 Personal history of other venous thrombosis and embolism: Secondary | ICD-10-CM

## 2020-07-03 DIAGNOSIS — Z862 Personal history of diseases of the blood and blood-forming organs and certain disorders involving the immune mechanism: Secondary | ICD-10-CM

## 2020-07-03 DIAGNOSIS — E559 Vitamin D deficiency, unspecified: Secondary | ICD-10-CM | POA: Diagnosis not present

## 2020-07-03 DIAGNOSIS — I10 Essential (primary) hypertension: Secondary | ICD-10-CM | POA: Diagnosis not present

## 2020-07-03 DIAGNOSIS — R739 Hyperglycemia, unspecified: Secondary | ICD-10-CM | POA: Diagnosis not present

## 2020-07-03 MED ORDER — AMLODIPINE BESYLATE 5 MG PO TABS: 5.0000 mg | ORAL_TABLET | Freq: Every day | ORAL | 5 refills | Status: DC

## 2020-07-03 MED ORDER — FLUCONAZOLE 150 MG PO TABS: 150.0000 mg | ORAL_TABLET | ORAL | 0 refills | Status: DC

## 2020-07-03 MED ORDER — GERHARDT'S BUTT CREAM: 1.0000 "application " | TOPICAL_CREAM | Freq: Four times a day (QID) | CUTANEOUS | 1 refills | Status: DC | PRN

## 2020-07-03 MED ORDER — ATORVASTATIN CALCIUM 20 MG PO TABS: 20.0000 mg | ORAL_TABLET | Freq: Every day | ORAL | 5 refills | Status: DC

## 2020-07-04 LAB — COMPLETE METABOLIC PANEL WITH GFR
AG Ratio: 1.5 (calc) (ref 1.0–2.5)
ALT: 37 U/L — ABNORMAL HIGH (ref 6–29)
AST: 29 U/L (ref 10–35)
Albumin: 4.4 g/dL (ref 3.6–5.1)
Alkaline phosphatase (APISO): 95 U/L (ref 37–153)
BUN: 13 mg/dL (ref 7–25)
CO2: 27 mmol/L (ref 20–32)
Calcium: 9.6 mg/dL (ref 8.6–10.4)
Chloride: 105 mmol/L (ref 98–110)
Creat: 0.89 mg/dL (ref 0.50–1.05)
GFR, Est African American: 83 mL/min/{1.73_m2} (ref >=60)
GFR, Est Non African American: 71 mL/min/{1.73_m2} (ref >=60)
Globulin: 2.9 g/dL (calc) (ref 1.9–3.7)
Glucose, Bld: 78 mg/dL (ref 65–99)
Potassium: 4 mmol/L (ref 3.5–5.3)
Sodium: 142 mmol/L (ref 135–146)
Total Bilirubin: 0.5 mg/dL (ref 0.2–1.2)
Total Protein: 7.3 g/dL (ref 6.1–8.1)

## 2020-07-04 LAB — TSH: TSH: 2.97 mIU/L (ref 0.40–4.50)

## 2020-07-04 LAB — LIPID PANEL
Cholesterol: 183 mg/dL (ref <200)
HDL: 82 mg/dL (ref >=50)
LDL Cholesterol (Calc): 85 mg/dL (calc)
Non-HDL Cholesterol (Calc): 101 mg/dL (calc) (ref <130)
Total CHOL/HDL Ratio: 2.2 (calc) (ref <5.0)
Triglycerides: 73 mg/dL (ref <150)

## 2020-07-04 LAB — CBC WITH DIFFERENTIAL/PLATELET
Absolute Monocytes: 690 cells/uL (ref 200–950)
Basophils Absolute: 42 cells/uL (ref 0–200)
Basophils Relative: 0.7 %
Eosinophils Absolute: 108 cells/uL (ref 15–500)
Eosinophils Relative: 1.8 %
HCT: 42.3 % (ref 35.0–45.0)
Hemoglobin: 13.9 g/dL (ref 11.7–15.5)
Lymphs Abs: 2094 cells/uL (ref 850–3900)
MCH: 28.4 pg (ref 27.0–33.0)
MCHC: 32.9 g/dL (ref 32.0–36.0)
MCV: 86.5 fL (ref 80.0–100.0)
MPV: 9.4 fL (ref 7.5–12.5)
Monocytes Relative: 11.5 %
Neutro Abs: 3066 cells/uL (ref 1500–7800)
Neutrophils Relative %: 51.1 %
Platelets: 260 10*3/uL (ref 140–400)
RBC: 4.89 10*6/uL (ref 3.80–5.10)
RDW: 12.3 % (ref 11.0–15.0)
Total Lymphocyte: 34.9 %
WBC: 6 10*3/uL (ref 3.8–10.8)

## 2020-07-04 LAB — HEMOGLOBIN A1C
Hgb A1c MFr Bld: 5.5 % of total Hgb (ref <5.7)
Mean Plasma Glucose: 111 mg/dL
eAG (mmol/L): 6.2 mmol/L

## 2020-07-04 LAB — VITAMIN D 25 HYDROXY (VIT D DEFICIENCY, FRACTURES): Vit D, 25-Hydroxy: 42 ng/mL (ref 30–100)

## 2020-07-16 ENCOUNTER — Other Ambulatory Visit: Payer: Self-pay | Admitting: Family Medicine

## 2020-07-21 NOTE — Progress Notes (Signed)
Name: Rebecca Bruce   MRN: [id number]    DOB: [birth year]   Date:07/22/2020       Progress Note  Subjective  Chief Complaint  FL2  HPI  Skin ulcer: going on for weeks now, burns a little bit, she has been applying vaseline and keeping area clean. She thinks it is improving   FL2 forms filled out , reviewed labs with patient and her caregiver Erby Pian )   Patient Active Problem List   Diagnosis Date Noted  . Transaminitis   . Pressure injury of skin 03/28/2019  . History of COVID-19   . 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 Hyperglycemia 07/13/2018  . Pre-diabetes 01/05/2017  . Dyslipidemia 07/02/2015  . History of hyperthyroidism 07/02/2015  . History of radioactive iodine thyroid ablation 07/02/2015  . Hypothyroidism (acquired) 07/02/2015  . Hypertension, benign 07/02/2015  . Anxiety 07/02/2015  . Vitamin D deficiency 07/02/2015  . Insomnia 07/02/2015  . OA (osteoarthritis) of knee 07/02/2015  . GERD (gastroesophageal reflux disease) 07/02/2015    Past Surgical History:  Procedure Laterality Date  . DENTAL SURGERY    . IR ANGIOGRAM SELECTIVE EACH ADDITIONAL VESSEL  03/29/2019  . IR ANGIOGRAM SELECTIVE EACH ADDITIONAL VESSEL  03/29/2019  . IR ANGIOGRAM SELECTIVE EACH ADDITIONAL VESSEL  03/29/2019  . IR ANGIOGRAM SELECTIVE EACH ADDITIONAL VESSEL  03/29/2019  . IR ANGIOGRAM VISCERAL SELECTIVE  03/29/2019  . IR ANGIOGRAM VISCERAL SELECTIVE  03/29/2019  . IR US GUIDE VASC ACCESS RIGHT  03/29/2019    Family History  Problem Relation Age of Onset  . Heart disease Mother   . Breast cancer Neg Hx     Social History   Tobacco Use  . Smoking status: Never Smoker  . Smokeless tobacco: Never Used  . Tobacco comment: smoking cessation materials not required  Substance Use Topics  . Alcohol use: No    Alcohol/week: 0.0 standard drinks     Current Outpatient Medications:  .  acetaminophen (TYLENOL) 325 MG tablet, Take 1-2 tablets (325-650 mg total) by  mouth every 4 (four) hours as needed for mild pain., Disp:  , Rfl:  .  amLODipine (NORVASC) 5 MG tablet, Take 1 tablet (5 mg total) by mouth daily., Disp: 30 tablet, Rfl: 5 .  ascorbic acid (VITAMIN C) 500 MG/5ML syrup, Take 5 mLs (500 mg total) by mouth daily., Disp: 473 mL, Rfl: 12 .  atorvastatin (LIPITOR) 20 MG tablet, Take 1 tablet (20 mg total) by mouth daily at 6 PM., Disp: 30 tablet, Rfl: 5 .  Bioflavonoid Products (LIQUID C) LIQD, TAKE 5 ML (500MG ) BY MOUTH ONCE DAILY, Disp: 120 mL, Rfl: 10 .  fluconazole (DIFLUCAN) 150 MG tablet, Take 1 tablet (150 mg total) by mouth every other day., Disp: 3 tablet, Rfl: 0 .  gabapentin (NEURONTIN) 300 MG capsule, Take 1 capsule (300 mg total) by mouth at bedtime., Disp: 900 capsule, Rfl: 1 .  hydrocerin (EUCERIN) CREA, Apply 1 application topically daily. To bilateral feet, Disp: , Rfl: 0 .  Ipratropium-Albuterol (COMBIVENT) 20-100 MCG/ACT AERS respimat, Inhale 1 puff into the lungs every 6 (six) hours as needed for wheezing., Disp: 4 g, Rfl: 0 .  levothyroxine (SYNTHROID) 100 MCG tablet, Take 1 tablet (100 mcg total) by mouth daily before breakfast., Disp: 30 tablet, Rfl: 2 .  LORazepam (ATIVAN) 0.5 MG tablet, Take 0.5 mg by mouth daily as needed for anxiety. Can take an extra pill 30 minutes after the first dose if needed, Disp: , Rfl:  .  Multiple Vitamin (MULTIVITAMIN WITH MINERALS) TABS tablet, Take 1 tablet by mouth daily., Disp:  , Rfl:  .  Multiple Vitamins-Minerals (THEREMS-M) TABS, Take 1 tablet by mouth daily., Disp: 30 tablet, Rfl: 5 .  Nystatin (GERHARDT'S BUTT CREAM) CREA, Apply 1 application topically 4 (four) times daily as needed for irritation. On abdominal fold, intertrigo, Disp: 1 each, Rfl: 1 .  PARoxetine (PAXIL) 30 MG tablet, Take 1 tablet (30 mg total) by mouth at bedtime., Disp: 30 tablet, Rfl: 2 .  potassium chloride (KLOR-CON) 20 MEQ packet, Take 20 mEq by mouth 2 (two) times daily., Disp: 60 packet, Rfl: 0 .  Probiotic Product  (UP4 PROBIOTICS ADULT) CAPS, Take 250 mg by mouth daily., Disp: 30 capsule, Rfl: 3 .  saccharomyces boulardii (FLORASTOR) 250 MG capsule, Take 1 capsule (250 mg total) by mouth daily., Disp: 30 capsule, Rfl: 0 .  VIACTIV CALCIUM PLUS D 650-12.5-40 MG-MCG CHEW, CHEW AND SWALLOW ONE PIECE TWICE DAILY. (SUPPLEMENT) CARAMEL (Patient taking differently: Chew 1 Dose by mouth 2 (two) times daily.), Disp: 60 tablet, Rfl: 12 .  zinc sulfate 220 (50 Zn) MG capsule, Take 1 capsule (220 mg total) by mouth daily., Disp: , Rfl:   No Known Allergies  I personally reviewed active problem list, medication list, allergies, family history, social history, health maintenance with the patient/caregiver today.   ROS  Ten systems reviewed and is negative except as mentioned in HPI  Per caregiver doing well , uses walker but getting stronger   Objective  Vitals:   07/22/20 1010  BP: 124/86  Pulse: 82  Resp: 16  Temp: 98.7 F (37.1 C)  TempSrc: Oral  SpO2: 95%  Weight: 181 lb (82.1 kg)  Height: 5\' 5"  (1.651 m)    Body mass index is 30.12 kg/m.  Physical Exam  Constitutional: Patient appears well-developed and well-nourished. ObeseNo distress.  HEENT: head atraumatic, normocephalic, pupils equal and reactive to light,  neck supple Cardiovascular: Normal rate, regular rhythm and normal heart sounds.  No murmur heard. No BLE edema. Pulmonary/Chest: Effort normal and breath sounds normal. No respiratory distress. Abdominal: Soft.  There is no tenderness. Skin: small ( pea size ) ulceration on left abdominal fold, no signs of infection, superficial ulceration  Psychiatric: Patient has a normal mood and affect. behavior is normal.   Recent Results (from the past 2160 hour(s))  Lipid panel     Status: None   Collection Time: 07/03/20  9:51 AM  Result Value Ref Range   Cholesterol 183 <200 mg/dL   HDL 82 > OR = 50 mg/dL   Triglycerides 73 <150 mg/dL   LDL Cholesterol (Calc) 85 mg/dL (calc)     Comment: Reference range: <100 . Desirable range <100 mg/dL for primary prevention;   <70 mg/dL for patients with CHD or diabetic patients  with > or = 2 CHD risk factors. Marland Kitchen LDL-C is now calculated using the Martin-Hopkins  calculation, which is a validated novel method providing  better accuracy than the Friedewald equation in the  estimation of LDL-C.  Cresenciano Genre et al. Annamaria Helling. 6720;947(09): 2061-2068  (http://education.[facility].com/faq/FAQ164)    Total CHOL/HDL Ratio 2.2 <5.0 (calc)   Non-HDL Cholesterol (Calc) 101 <130 mg/dL (calc)    Comment: For patients with diabetes plus 1 major ASCVD risk  factor, treating to a non-HDL-C goal of <100 mg/dL  (LDL-C of <70 mg/dL) is considered a therapeutic  option.   COMPLETE METABOLIC PANEL WITH GFR     Status: Abnormal   Collection Time:  07/03/20  9:51 AM  Result Value Ref Range   Glucose, Bld 78 65 - 99 mg/dL    Comment: .            Fasting reference interval .    BUN 13 7 - 25 mg/dL   Creat 0.89 0.50 - 1.05 mg/dL    Comment: For patients >70 years of age, the reference limit for Creatinine is approximately 13% higher for people identified as African-American. .    GFR, Est Non African American 71 > OR = 60 mL/min/1.55m2   GFR, Est African American 83 > OR = 60 mL/min/1.24m2   BUN/Creatinine Ratio NOT APPLICABLE 6 - 22 (calc)   Sodium 142 135 - 146 mmol/L   Potassium 4.0 3.5 - 5.3 mmol/L   Chloride 105 98 - 110 mmol/L   CO2 27 20 - 32 mmol/L   Calcium 9.6 8.6 - 10.4 mg/dL   Total Protein 7.3 6.1 - 8.1 g/dL   Albumin 4.4 3.6 - 5.1 g/dL   Globulin 2.9 1.9 - 3.7 g/dL (calc)   AG Ratio 1.5 1.0 - 2.5 (calc)   Total Bilirubin 0.5 0.2 - 1.2 mg/dL   Alkaline phosphatase (APISO) 95 37 - 153 U/L   AST 29 10 - 35 U/L   ALT 37 (H) 6 - 29 U/L  CBC with Differential/Platelet     Status: None   Collection Time: 07/03/20  9:51 AM  Result Value Ref Range   WBC 6.0 3.8 - 10.8 Thousand/uL   RBC 4.89 3.80 - 5.10 Million/uL    Hemoglobin 13.9 11.7 - 15.5 g/dL   HCT 42.3 35.0 - 45.0 %   MCV 86.5 80.0 - 100.0 fL   MCH 28.4 27.0 - 33.0 pg   MCHC 32.9 32.0 - 36.0 g/dL   RDW 12.3 11.0 - 15.0 %   Platelets 260 140 - 400 Thousand/uL   MPV 9.4 7.5 - 12.5 fL   Neutro Abs 3,066 1,500 - 7,800 cells/uL   Lymphs Abs 2,094 850 - 3,900 cells/uL   Absolute Monocytes 690 200 - 950 cells/uL   Eosinophils Absolute 108 15 - 500 cells/uL   Basophils Absolute 42 0 - 200 cells/uL   Neutrophils Relative % 51.1 %   Total Lymphocyte 34.9 %   Monocytes Relative 11.5 %   Eosinophils Relative 1.8 %   Basophils Relative 0.7 %  VITAMIN D 25 Hydroxy (Vit-D Deficiency, Fractures)     Status: None   Collection Time: 07/03/20  9:51 AM  Result Value Ref Range   Vit D, 25-Hydroxy 42 30 - 100 ng/mL    Comment: Vitamin D Status         25-OH Vitamin D: . Deficiency:                    <20 ng/mL Insufficiency:             20 - 29 ng/mL Optimal:                 > or = 30 ng/mL . For 25-OH Vitamin D testing on patients on  D2-supplementation and patients for whom quantitation  of D2 and D3 fractions is required, the [facility](TM) 25-OH VIT D, (D2,D3), LC/MS/MS is recommended: order  code [id number] (patients >78yrs). See Note 1 . Note 1 . For additional information, please refer to  http://education.[facility].com/faq/FAQ199  (This link is being provided for informational/ educational purposes only.)   Hemoglobin A1c     Status: None  Collection Time: 07/03/20  9:51 AM  Result Value Ref Range   Hgb A1c MFr Bld 5.5 <5.7 % of total Hgb    Comment: For the purpose of screening for the presence of diabetes: . <5.7%       Consistent with the absence of diabetes 5.7-6.4%    Consistent with increased risk for diabetes             (prediabetes) > or =6.5%  Consistent with diabetes . This assay result is consistent with a decreased risk of diabetes. . Currently, no consensus exists regarding use of hemoglobin A1c for diagnosis  of diabetes in children. . According to American Diabetes Association (ADA) guidelines, hemoglobin A1c <7.0% represents optimal control in non-pregnant diabetic patients. Different metrics may apply to specific patient populations.  Standards of Medical Care in Diabetes(ADA). .    Mean Plasma Glucose 111 mg/dL   eAG (mmol/L) 6.2 mmol/L  TSH     Status: None   Collection Time: 07/03/20  9:51 AM  Result Value Ref Range   TSH 2.97 0.40 - 4.50 mIU/L      PHQ2/9: Depression screen [facility] 2/9 07/22/2020 07/03/2020 11/28/2019 11/15/2019 07/15/2019  Decreased Interest 0 0 0 0 0  Down, Depressed, Hopeless 0 0 0 0 0  PHQ - 2 Score 0 0 0 0 0  Altered sleeping - - 0 0 0  Tired, decreased energy - - 0 0 0  Change in appetite - - 0 0 0  Feeling bad or failure about yourself  - - 0 0 0  Trouble concentrating - - 0 0 0  Moving slowly or fidgety/restless - - 0 0 0  Suicidal thoughts - - 0 0 0  PHQ-9 Score - - 0 0 0  Difficult doing work/chores - - - - Not difficult at all  Some recent data might be hidden    phq 9 is negative   Fall Risk: Fall Risk  07/22/2020 07/03/2020 11/28/2019 11/15/2019 11/15/2019  Falls in the past year? 0 0 0 0 0  Number falls in past yr: 0 0 0 0 0  Injury with Fall? 0 0 0 0 0  Risk for fall due to : - - - - -  Risk for fall due to: Comment - - - - -  Follow up - - - - -     Functional Status Survey: Is the patient deaf or have difficulty hearing?: No Does the patient have difficulty seeing, even when wearing glasses/contacts?: No Does the patient have difficulty concentrating, remembering, or making decisions?: No Does the patient have difficulty walking or climbing stairs?: No Does the patient have difficulty dressing or bathing?: No Does the patient have difficulty doing errands alone such as visiting a doctor's office or shopping?: Yes    Assessment & Plan  1. Mild intellectual disabilities  Stable   2. Intertriginous skin ulcer, limited to breakdown of  skin ([place])  Continue barrier cream, advised caregiver to monitor for worsening and to let me know

## 2020-07-22 ENCOUNTER — Ambulatory Visit (INDEPENDENT_AMBULATORY_CARE_PROVIDER_SITE_OTHER): Payer: Medicare Other | Admitting: Family Medicine

## 2020-07-22 ENCOUNTER — Encounter: Payer: Self-pay | Admitting: Family Medicine

## 2020-07-22 VITALS — BP 124/86 | HR 82 | Temp 98.7°F | Resp 16 | Ht 65.0 in | Wt 181.0 lb

## 2020-07-22 DIAGNOSIS — L98491 Non-pressure chronic ulcer of skin of other sites limited to breakdown of skin: Secondary | ICD-10-CM

## 2020-07-22 DIAGNOSIS — F70 Mild intellectual disabilities: Secondary | ICD-10-CM | POA: Diagnosis not present

## 2020-07-24 ENCOUNTER — Other Ambulatory Visit: Payer: Self-pay | Admitting: Family Medicine

## 2020-07-24 NOTE — Telephone Encounter (Signed)
   Notes to clinic: medication was last filled by a different provider  Review for continued use and refill    Requested Prescriptions  Pending Prescriptions Disp Refills   potassium chloride (KLOR-CON) 20 MEQ packet [Pharmacy Med Name: Potassium Chloride 20 MEQ Packet] 60 each 10    Sig: MIX 1 PACKET AS DIRECTED, THEN TAKE BY MOUTH TWICE DAILY *TAKE WITH FOOD* *DISSOLVE IN 4-8 OUNCES OF COLD JUICE OR WATER*      Endocrinology:  Minerals - Potassium Supplementation Passed - 07/24/2020  9:24 AM      Passed - K in normal range and within 360 days    Potassium  Date Value Ref Range Status  07/03/2020 4.0 3.5 - 5.3 mmol/L Final          Passed - Cr in normal range and within 360 days    Creat  Date Value Ref Range Status  07/03/2020 0.89 0.50 - 1.05 mg/dL Final    Comment:    For patients >62 years of age, the reference limit for Creatinine is approximately 13% higher for people identified as African-American. Renella Cunas - Valid encounter within last 12 months    Recent Outpatient Visits           2 days ago Mild intellectual disabilities   [facility] Steele Sizer, MD   3 weeks ago Atherosclerosis of aorta [facility])   [facility] Steele Sizer, MD   7 months ago History of UTI   [facility] Steele Sizer, MD   8 months ago Recurrent boils   [facility] Steele Sizer, MD   1 year ago Hypothyroidism (acquired)   [facility] Steele Sizer, MD       Future Appointments             In 6 months Ancil Boozer, [name], MD [facility], [place]   In 12 months Steele Sizer, MD [facility], [facility]

## 2020-07-24 NOTE — Telephone Encounter (Signed)
Pt was just seen on 07-22-2020 and has another appt for 01-22-2021. For 6 month followup

## 2020-09-08 ENCOUNTER — Other Ambulatory Visit: Payer: Self-pay | Admitting: Family Medicine

## 2020-09-08 DIAGNOSIS — Z1231 Encounter for screening mammogram for malignant neoplasm of breast: Secondary | ICD-10-CM

## 2020-09-08 DIAGNOSIS — I82411 Acute embolism and thrombosis of right femoral vein: Secondary | ICD-10-CM

## 2020-09-23 DIAGNOSIS — Z23 Encounter for immunization: Secondary | ICD-10-CM | POA: Diagnosis not present

## 2020-11-12 ENCOUNTER — Other Ambulatory Visit: Payer: Self-pay | Admitting: Physical Medicine & Rehabilitation

## 2020-11-16 ENCOUNTER — Other Ambulatory Visit: Payer: Self-pay | Admitting: Family Medicine

## 2020-11-16 NOTE — Telephone Encounter (Signed)
Pts next appt is 01/22/21

## 2020-11-16 NOTE — Telephone Encounter (Signed)
   Notes to clinic:  Script requested has expired  Medication filled by a different provider     Requested Prescriptions  Pending Prescriptions Disp Refills   Skin Protectants, Misc. (MINERIN CREME) CREA [Pharmacy Med Name: Minerin Creme Cream] 396 g 10    Sig: APPLY 1 APPLIACTION  TOPICALLY ONCE DAILY  TO BILATERAL FEET      Off-Protocol Failed - 11/16/2020  9:29 AM      Failed - Medication not assigned to a protocol, review manually.      Passed - Valid encounter within last 12 months    Recent Outpatient Visits           3 months ago Mild intellectual disabilities   [facility] Steele Sizer, MD   4 months ago Atherosclerosis of aorta [facility])   [facility] Steele Sizer, MD   11 months ago History of UTI   [facility] Steele Sizer, MD   1 year ago Recurrent boils   [facility] Steele Sizer, MD   1 year ago Hypothyroidism (acquired)   [facility] Steele Sizer, MD       Future Appointments             In 2 months Steele Sizer, MD [facility], [place]   In 8 months Steele Sizer, MD [facility], [facility]            Dermatology:  Other Passed - 11/16/2020  9:29 AM      Passed - Valid encounter within last 12 months    Recent Outpatient Visits           3 months ago Mild intellectual disabilities   [facility] Steele Sizer, MD   4 months ago Atherosclerosis of aorta [facility])   [facility] Steele Sizer, MD   11 months ago History of UTI   [facility] Steele Sizer, MD   1 year ago Recurrent boils   [facility] Steele Sizer, MD   1 year ago Hypothyroidism (acquired)   [facility] Steele Sizer, MD       Future Appointments             In 2 months Ancil Boozer, [name], MD [facility], [place]    In 8 months Steele Sizer, MD [facility], [facility]

## 2021-01-21 NOTE — Progress Notes (Signed)
Name: Rebecca Bruce   MRN: [id number]    DOB: January 06, 1961   Date:01/22/2021       Progress Note  Subjective  Chief Complaint  Follow Up - She came in with caregiver [place]   HPI  HTN: taking medications, no side effects, denies chest pain or palpitation. She came in today with [place] , BP is at goal here and at home.  She lives at [facility] [employer]    Hyperlipidemia: taking medication as prescribed and no problems, denies myalgias. Last LDL was 85    Hypothyroidism : used to see  Dr. Rosario Jacks, s/p ablation for hyperthyroidism, doing well on Synthroid 100 mcg daily,last TSH was at goal in March 2022    MR: stable, sees psychiatrist, Dr. Tamera Punt  for her behavior, she lives in a group home She needs assistance bathing and dressing, managing her money. Her brother [name] is the guardian, her behavior has been good , but refuses procedures and unable to do a pap smear, no recent mammograms but caregiver will try to take her again, able to do Cologuard    Pre-diabetes: hgbA1C was 6.3%  In 2017, last time we checked was 06/2020 and it was 5.5 %  She denies polyphagia, polydipsia or polyuria.   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   Hyperglycemia 07/13/2018   Pre-diabetes 01/05/2017   Dyslipidemia 07/02/2015   History of hyperthyroidism 07/02/2015   History of radioactive iodine thyroid ablation 07/02/2015   Hypothyroidism (acquired) 07/02/2015   Hypertension, benign 07/02/2015   Anxiety 07/02/2015   Vitamin D deficiency 07/02/2015   Insomnia 07/02/2015   OA (osteoarthritis) of knee 07/02/2015   GERD (gastroesophageal  reflux disease) 07/02/2015    Past Surgical History:  Procedure Laterality Date   DENTAL SURGERY     IR ANGIOGRAM SELECTIVE EACH ADDITIONAL VESSEL  03/29/2019   IR ANGIOGRAM SELECTIVE EACH ADDITIONAL VESSEL  03/29/2019   IR ANGIOGRAM SELECTIVE EACH ADDITIONAL VESSEL  03/29/2019   IR ANGIOGRAM SELECTIVE EACH ADDITIONAL VESSEL  03/29/2019   IR ANGIOGRAM VISCERAL SELECTIVE  03/29/2019   IR ANGIOGRAM VISCERAL SELECTIVE  03/29/2019   IR US GUIDE VASC ACCESS RIGHT  03/29/2019    Family History  Problem Relation Age of Onset   Heart disease Mother    Breast cancer Neg Hx     Social History   Tobacco Use   Smoking status: Never   Smokeless tobacco: Never   Tobacco comments:    smoking cessation materials not required  Substance Use Topics   Alcohol use: No    Alcohol/week: 0.0 standard drinks     Current Outpatient Medications:    acetaminophen (TYLENOL) 325 MG tablet, Take 1-2 tablets (325-650 mg total) by mouth every 4 (four) hours as needed for mild pain., Disp:  , Rfl:    amLODipine (NORVASC) 5 MG tablet, Take 1 tablet (5 mg total) by mouth daily., Disp: 30 tablet, Rfl: 5   ascorbic acid (VITAMIN C) 500 MG/5ML syrup, Take 5 mLs (500 mg total) by mouth daily., Disp: 473 mL, Rfl: 12   atorvastatin (LIPITOR) 20 MG  tablet, Take 1 tablet (20 mg total) by mouth daily at 6 PM., Disp: 30 tablet, Rfl: 5   Bioflavonoid Products (LIQUID C) LIQD, TAKE 5 ML (500MG ) BY MOUTH ONCE DAILY, Disp: 120 mL, Rfl: 10   fluconazole (DIFLUCAN) 150 MG tablet, Take 1 tablet (150 mg total) by mouth every other day., Disp: 3 tablet, Rfl: 0   gabapentin (NEURONTIN) 300 MG capsule, Take 1 capsule (300 mg total) by mouth at bedtime., Disp: 900 capsule, Rfl: 1   Ipratropium-Albuterol (COMBIVENT) 20-100 MCG/ACT AERS respimat, Inhale 1 puff into the lungs every 6 (six) hours as needed for wheezing., Disp: 4 g, Rfl: 0   levothyroxine (SYNTHROID) 100 MCG tablet, Take 1 tablet (100 mcg total) by mouth daily before  breakfast., Disp: 30 tablet, Rfl: 2   LORazepam (ATIVAN) 0.5 MG tablet, Take 0.5 mg by mouth daily as needed for anxiety. Can take an extra pill 30 minutes after the first dose if needed, Disp: , Rfl:    Multiple Vitamin (MULTIVITAMIN WITH MINERALS) TABS tablet, Take 1 tablet by mouth daily., Disp:  , Rfl:    Multiple Vitamins-Minerals (THEREMS-M) TABS, Take 1 tablet by mouth daily., Disp: 30 tablet, Rfl: 5   Nystatin (GERHARDT'S BUTT CREAM) CREA, Apply 1 application topically 4 (four) times daily as needed for irritation. On abdominal fold, intertrigo, Disp: 1 each, Rfl: 1   PARoxetine (PAXIL) 30 MG tablet, Take 1 tablet (30 mg total) by mouth at bedtime., Disp: 30 tablet, Rfl: 2   potassium chloride (KLOR-CON) 20 MEQ packet, MIX 1 PACKET AS DIRECTED, THEN TAKE BY MOUTH TWICE DAILY *TAKE WITH FOOD* *DISSOLVE IN 4-8 OUNCES OF COLD JUICE OR WATER*, Disp: 60 each, Rfl: 10   Probiotic Product (UP4 PROBIOTICS ADULT) CAPS, Take 250 mg by mouth daily., Disp: 30 capsule, Rfl: 3   saccharomyces boulardii (FLORASTOR) 250 MG capsule, Take 1 capsule (250 mg total) by mouth daily., Disp: 30 capsule, Rfl: 0   Skin Protectants, Misc. (MINERIN CREME) CREA, APPLY 1 APPLIACTION  TOPICALLY ONCE DAILY  TO BILATERAL FEET, Disp: 396 g, Rfl: 10   VIACTIV CALCIUM PLUS D 650-12.5-40 MG-MCG CHEW, CHEW AND SWALLOW ONE PIECE TWICE DAILY. (SUPPLEMENT) CARAMEL (Patient taking differently: Chew 1 Dose by mouth 2 (two) times daily.), Disp: 60 tablet, Rfl: 12   zinc sulfate 220 (50 Zn) MG capsule, Take 1 capsule (220 mg total) by mouth daily., Disp: , Rfl:   No Known Allergies  I personally reviewed active problem list, medication list, allergies, family history, social history with the patient/caregiver today.   ROS  Constitutional: Negative for fever or weight change.  Respiratory: Negative for cough and shortness of breath.   Cardiovascular: Negative for chest pain or palpitations.  Gastrointestinal: Negative for  abdominal pain, no bowel changes.  Musculoskeletal: positive  for gait problem but no  joint swelling.  Skin: Negative for rash.  Neurological: Negative for dizziness or headache.  No other specific complaints in a complete review of systems (except as listed in HPI above).   Objective  Vitals:   01/22/21 0845  BP: 138/82  Pulse: 94  Resp: 18  Temp: 97.9 F (36.6 C)  TempSrc: Oral  SpO2: 99%  Weight: 181 lb 3.2 oz (82.2 kg)  Height: 5\' 5"  (1.651 m)    Body mass index is 30.15 kg/m.  Physical Exam  Constitutional: Patient appears well-developed and well-nourished. Obese  No distress.  HEENT: head atraumatic, normocephalic, pupils equal and reactive to light, neck supple Cardiovascular: Normal rate, regular rhythm  and normal heart sounds.  No murmur heard. No BLE edema. Pulmonary/Chest: Effort normal and breath sounds normal. No respiratory distress. Abdominal: Soft.  There is no tenderness. Psychiatric: Patient has a normal mood and affect. behavior is normal. Judgment and thought content normal.   PHQ2/9: Depression screen [facility] 2/9 01/22/2021 07/22/2020 07/03/2020 11/28/2019 11/15/2019  Decreased Interest 0 0 0 0 0  Down, Depressed, Hopeless 0 0 0 0 0  PHQ - 2 Score 0 0 0 0 0  Altered sleeping - - - 0 0  Tired, decreased energy - - - 0 0  Change in appetite - - - 0 0  Feeling bad or failure about yourself  - - - 0 0  Trouble concentrating - - - 0 0  Moving slowly or fidgety/restless - - - 0 0  Suicidal thoughts - - - 0 0  PHQ-9 Score - - - 0 0  Difficult doing work/chores - - - - -  Some recent data might be hidden    phq 9 is negative   Fall Risk: Fall Risk  01/22/2021 07/22/2020 07/03/2020 11/28/2019 11/15/2019  Falls in the past year? 1 0 0 0 0  Number falls in past yr: 0 0 0 0 0  Injury with Fall? 0 0 0 0 0  Risk for fall due to : No Fall Risks - - - -  Risk for fall due to: Comment - - - - -  Follow up Falls prevention discussed - - - -     Functional Status  Survey: Is the patient deaf or have difficulty hearing?: No Does the patient have difficulty seeing, even when wearing glasses/contacts?: No Does the patient have difficulty concentrating, remembering, or making decisions?: No Does the patient have difficulty walking or climbing stairs?: Yes Does the patient have difficulty dressing or bathing?: Yes Does the patient have difficulty doing errands alone such as visiting a doctor's office or shopping?: Yes    Assessment & Plan  1. Atherosclerosis of aorta ([place])  On statin therapy, bp is at goal, we will add aspirin 81 mg daily   2. Pre-diabetes  Doing well   3. Hypothyroidism (acquired)  Taking medication daily   4. Dyslipidemia  On statin therapy   5. Hyperglycemia  Last A1C improved  6. Need for influenza vaccination  - Flu Vaccine QUAD 6+ mos PF IM (Fluarix Quad PF)  7. Vitamin D deficiency   8. Mild intellectual disabilities  Stable  9. Hypertension, benign  At goal, continue medication  10. Colon cancer screening  - Cologuard  11. Need for shingles vaccine  - Zoster Vaccine Adjuvanted [facility]) injection; Inject 0.5 mLs into the muscle once for 1 dose.  Dispense: 0.5 mL; Refill: 1

## 2021-01-22 ENCOUNTER — Other Ambulatory Visit: Payer: Self-pay

## 2021-01-22 ENCOUNTER — Ambulatory Visit (INDEPENDENT_AMBULATORY_CARE_PROVIDER_SITE_OTHER): Payer: Medicare Other | Admitting: Family Medicine

## 2021-01-22 ENCOUNTER — Encounter: Payer: Self-pay | Admitting: Family Medicine

## 2021-01-22 VITALS — BP 138/82 | HR 94 | Temp 97.9°F | Resp 18 | Ht 65.0 in | Wt 181.2 lb

## 2021-01-22 DIAGNOSIS — I10 Essential (primary) hypertension: Secondary | ICD-10-CM

## 2021-01-22 DIAGNOSIS — R739 Hyperglycemia, unspecified: Secondary | ICD-10-CM | POA: Diagnosis not present

## 2021-01-22 DIAGNOSIS — F70 Mild intellectual disabilities: Secondary | ICD-10-CM

## 2021-01-22 DIAGNOSIS — E039 Hypothyroidism, unspecified: Secondary | ICD-10-CM

## 2021-01-22 DIAGNOSIS — I700 Atherosclerosis of aorta: Secondary | ICD-10-CM

## 2021-01-22 DIAGNOSIS — E785 Hyperlipidemia, unspecified: Secondary | ICD-10-CM

## 2021-01-22 DIAGNOSIS — R7303 Prediabetes: Secondary | ICD-10-CM

## 2021-01-22 DIAGNOSIS — Z1211 Encounter for screening for malignant neoplasm of colon: Secondary | ICD-10-CM | POA: Diagnosis not present

## 2021-01-22 DIAGNOSIS — Z23 Encounter for immunization: Secondary | ICD-10-CM | POA: Diagnosis not present

## 2021-01-22 DIAGNOSIS — E559 Vitamin D deficiency, unspecified: Secondary | ICD-10-CM

## 2021-01-22 MED ORDER — ASPIRIN 81 MG PO TBEC: 81.0000 mg | DELAYED_RELEASE_TABLET | Freq: Every day | ORAL | 12 refills | Status: DC

## 2021-01-22 MED ORDER — SHINGRIX 50 MCG/0.5ML IM SUSR
0.5000 mL | Freq: Once | INTRAMUSCULAR | 1 refills | Status: DC
Start: 2021-01-22 — End: 2021-06-02

## 2021-01-26 ENCOUNTER — Other Ambulatory Visit: Payer: Self-pay | Admitting: Family Medicine

## 2021-01-26 DIAGNOSIS — Z1231 Encounter for screening mammogram for malignant neoplasm of breast: Secondary | ICD-10-CM

## 2021-02-07 DIAGNOSIS — Z1211 Encounter for screening for malignant neoplasm of colon: Secondary | ICD-10-CM | POA: Diagnosis not present

## 2021-02-16 DIAGNOSIS — Z23 Encounter for immunization: Secondary | ICD-10-CM | POA: Diagnosis not present

## 2021-02-16 LAB — COLOGUARD: COLOGUARD: NEGATIVE

## 2021-02-18 ENCOUNTER — Other Ambulatory Visit: Payer: Self-pay

## 2021-02-18 ENCOUNTER — Ambulatory Visit
Admission: RE | Admit: 2021-02-18 | Discharge: 2021-02-18 | Disposition: A | Discharge status: Home / Self Care | Payer: Medicare Other | Source: Ambulatory Visit | Attending: Family Medicine | Admitting: Family Medicine

## 2021-02-18 DIAGNOSIS — Z1231 Encounter for screening mammogram for malignant neoplasm of breast: Secondary | ICD-10-CM | POA: Insufficient documentation

## 2021-03-08 ENCOUNTER — Other Ambulatory Visit: Payer: Self-pay | Admitting: Family Medicine

## 2021-03-08 DIAGNOSIS — F72 Severe intellectual disabilities: Secondary | ICD-10-CM

## 2021-03-22 ENCOUNTER — Other Ambulatory Visit: Payer: Self-pay | Admitting: Family Medicine

## 2021-04-07 ENCOUNTER — Other Ambulatory Visit: Payer: Self-pay | Admitting: Family Medicine

## 2021-05-18 DIAGNOSIS — F339 Major depressive disorder, recurrent, unspecified: Secondary | ICD-10-CM | POA: Diagnosis not present

## 2021-06-02 ENCOUNTER — Other Ambulatory Visit: Payer: Self-pay | Admitting: Family Medicine

## 2021-06-02 DIAGNOSIS — Z23 Encounter for immunization: Secondary | ICD-10-CM

## 2021-06-14 ENCOUNTER — Encounter: Payer: Self-pay | Admitting: Physician Assistant

## 2021-06-14 ENCOUNTER — Ambulatory Visit (INDEPENDENT_AMBULATORY_CARE_PROVIDER_SITE_OTHER): Payer: Medicare Other | Admitting: Physician Assistant

## 2021-06-14 VITALS — BP 126/78 | HR 89 | Temp 97.9°F | Resp 16 | Ht 65.0 in | Wt 186.7 lb

## 2021-06-14 DIAGNOSIS — F424 Excoriation (skin-picking) disorder: Secondary | ICD-10-CM | POA: Insufficient documentation

## 2021-06-14 DIAGNOSIS — F419 Anxiety disorder, unspecified: Secondary | ICD-10-CM | POA: Diagnosis not present

## 2021-06-14 MED ORDER — HYDROXYZINE PAMOATE 25 MG PO CAPS
25.0000 mg | ORAL_CAPSULE | Freq: Three times a day (TID) | ORAL | 3 refills | Status: AC | PRN
Start: 2021-06-14 — End: ?

## 2021-06-14 NOTE — Patient Instructions (Addendum)
In order to help the areas that Shameca is picking at, I recommend the following: ? ?Keep the area clean with a gentle soap and warm water, then dry it thoroughly ?You can place Aquaphor or vaseline over the area to help it stay hydrated then cover with a bandaid to keep it clean and prevent further interference from [facility]. ? ?I recommend starting Hydroxyzine 25 mg by mouth twice per day as needed to assist with the picking and itching. ? ?It was nice to meet you and I appreciate the opportunity to be involved in your care ? ?

## 2021-06-14 NOTE — Assessment & Plan Note (Signed)
Chronic, historic condition ?Managed by psychiatry service at facility  ?Notable for associated excoriation of forearms with exacerbations ?She is currently taking Paxil and Ativan PRN  ?Recommend adding Hydroxyzine 25 mg PO BID PRN for anxiety and picking  ?Spoke with nurse at site who states she will coordinate with psychiatry service ? ?

## 2021-06-14 NOTE — Progress Notes (Signed)
Established Patient Office Visit  Subjective:  Patient ID: Rebecca Bruce, female    DOB: [birth year]  Age: 61 y.o. MRN: [id number]  Today's Provider: Jacquelin Hawking, MHS, PA-C Introduced myself to the patient as a PA-C and provided education on APPs in clinical practice.    CC:  Chief Complaint  Patient presents with   Rash   Insect Bite    HPI Rebecca Bruce presents for excoriation  She is here with Rebecca Bruce, a caretaker, to help with HPI   Rebecca Bruce states the patient  has anxiety that is especially triggered by thunderstorms and changes to her environment or routine She states the patient will begin picking at her skin in the same areas when she is anxious She is taking Paxil and Ativan PRN for help with symptoms      Past Medical History:  Diagnosis Date   GERD (gastroesophageal reflux disease)    Hypertension    Thyroid disease     Past Surgical History:  Procedure Laterality Date   DENTAL SURGERY     IR ANGIOGRAM SELECTIVE EACH ADDITIONAL VESSEL  03/29/2019   IR ANGIOGRAM SELECTIVE EACH ADDITIONAL VESSEL  03/29/2019   IR ANGIOGRAM SELECTIVE EACH ADDITIONAL VESSEL  03/29/2019   IR ANGIOGRAM SELECTIVE EACH ADDITIONAL VESSEL  03/29/2019   IR ANGIOGRAM VISCERAL SELECTIVE  03/29/2019   IR ANGIOGRAM VISCERAL SELECTIVE  03/29/2019   IR US GUIDE VASC ACCESS RIGHT  03/29/2019    Family History  Problem Relation Age of Onset   Heart disease Mother    Breast cancer Neg Hx     Social History   Socioeconomic History   Marital status: Single    Spouse name: Not on file   Number of children: 0   Years of education: Not on file   Highest education level: Not on file  Occupational History   Occupation: Disabled  Tobacco Use   Smoking status: Never   Smokeless tobacco: Never   Tobacco comments:    smoking cessation materials not required  Vaping Use   Vaping Use: Never used  Substance and Sexual Activity   Alcohol use: No    Alcohol/week: 0.0  standard drinks   Drug use: No   Sexual activity: Never  Other Topics Concern   Not on file  Social History Narrative   Stays At [employer], [place]   She goes to church with her brother every Sunday   Participate in group activities   Works part time at [employer]    Social Determinants of Corporate investment banker Strain: Not on [employer] Insecurity: Not on file  Transportation Needs: Not on file  Physical Activity: Not on file  Stress: Not on file  Social Connections: Not on file  Intimate Partner Violence: Not on file    Outpatient Medications Prior to Visit  Medication Sig Dispense Refill   acetaminophen (TYLENOL) 325 MG tablet Take 1-2 tablets (325-650 mg total) by mouth every 4 (four) hours as needed for mild pain.     amLODipine (NORVASC) 5 MG tablet Take 1 tablet (5 mg total) by mouth daily. 30 tablet 5   atorvastatin (LIPITOR) 20 MG tablet Take 1 tablet (20 mg total) by mouth daily at 6 PM. 30 tablet 5   gabapentin (NEURONTIN) 300 MG capsule Take 1 capsule (300 mg total) by mouth at bedtime. 900 capsule 1   Ipratropium-Albuterol (COMBIVENT) 20-100 MCG/ACT AERS respimat Inhale 1 puff into the lungs every 6 (  six) hours as needed for wheezing. 4 g 0   levothyroxine (SYNTHROID) 100 MCG tablet Take 1 tablet (100 mcg total) by mouth daily before breakfast. 30 tablet 2   LORazepam (ATIVAN) 0.5 MG tablet Take 0.5 mg by mouth daily as needed for anxiety. Can take an extra pill 30 minutes after the first dose if needed     Multiple Vitamin (MULTIVITAMIN WITH MINERALS) TABS tablet Take 1 tablet by mouth daily.     Multiple Vitamins-Minerals (THEREMS-M) TABS TAKE 1 TABLET BY MOUTH ONCE DAILY 7 tablet 10   Nystatin (GERHARDT'S BUTT CREAM) CREA Apply 1 application topically 4 (four) times daily as needed for irritation. On abdominal fold, intertrigo 1 each 1   PARoxetine (PAXIL) 30 MG tablet TAKE 1 TABLET BY MOUTH AT BEDTIME *DO NOT CRUSH OR CHEW* *HAZARDOUS  DRUG: WEAR GLOVES* 7 tablet 10   potassium chloride (KLOR-CON) 20 MEQ packet MIX 1 PACKET AS DIRECTED, THEN TAKE BY MOUTH TWICE DAILY *TAKE WITH FOOD* *DISSOLVE IN 4-8 OUNCES OF COLD JUICE OR WATER* 60 each 10   saccharomyces boulardii (FLORASTOR) 250 MG capsule Take 1 capsule (250 mg total) by mouth daily. 30 capsule 0   Skin Protectants, Misc. (MINERIN CREME) CREA APPLY 1 APPLIACTION  TOPICALLY ONCE DAILY  TO BILATERAL FEET 396 g 10   VIACTIV CALCIUM PLUS D 650-12.5-40 MG-MCG CHEW CHEW AND SWALLOW 1 PIECE BY MOUTH TWICE DAILY 100 tablet 10   zinc sulfate 220 (50 Zn) MG capsule Take 1 capsule (220 mg total) by mouth daily.     aspirin 81 MG EC tablet Take 1 tablet (81 mg total) by mouth daily. Swallow whole. (Patient not taking: Reported on 06/14/2021) 30 tablet 12   Bioflavonoid Products (LIQUID C) LIQD TAKE 5 ML (500MG ) BY MOUTH ONCE DAILY (Patient not taking: Reported on 06/14/2021) 120 mL 10   fluconazole (DIFLUCAN) 150 MG tablet Take 1 tablet (150 mg total) by mouth every other day. (Patient not taking: Reported on 06/14/2021) 3 tablet 0   LIQUID C 500 MG/5ML LIQD TAKE 5 ML (500MG ) BY MOUTH ONCE DAILY (Patient not taking: Reported on 06/14/2021) 120 mL 11   Probiotic Product (UP4 PROBIOTICS ADULT) CAPS Take 250 mg by mouth daily. (Patient not taking: Reported on 06/14/2021) 30 capsule 3   SHINGRIX injection INJECT 0.5ML INTRAMUSCULARLY FOR 1 DOSE *MIX THE 2 VIALS AS DIRECTED BEFORE DRAWING UP DOSE* (Patient not taking: Reported on 06/14/2021) 1 each 0   No facility-administered medications prior to visit.    No Known Allergies  ROS Review of Systems  Skin:  Positive for wound.  Psychiatric/Behavioral:  The patient is nervous/anxious.      Objective:    Physical Exam Vitals reviewed.  Constitutional:      Appearance: Normal appearance.  HENT:     Head: Normocephalic and atraumatic.  Skin:    Findings: Wound present.       Neurological:     Mental Status: She is alert.  Psychiatric:         Attention and Perception: She is inattentive.        Mood and Affect: Mood and affect normal.        Behavior: Behavior normal. Behavior is cooperative.        Cognition and Memory: Cognition is impaired.    BP 126/78 (BP Location: Right Arm, Patient Position: Sitting, Cuff Size: Normal)    Pulse 89    Temp 97.9 F (36.6 C) (Oral)    Resp 16  Ht 5\' 5"  (1.651 m) Comment: per chart   Wt 186 lb 11.2 oz (84.7 kg)    SpO2 99%    BMI 31.07 kg/m  Wt Readings from Last 3 Encounters:  06/14/21 186 lb 11.2 oz (84.7 kg)  01/22/21 181 lb 3.2 oz (82.2 kg)  07/22/20 181 lb (82.1 kg)     There are no preventive care reminders to display for this patient.  There are no preventive care reminders to display for this patient.  Lab Results  Component Value Date   TSH 2.97 07/03/2020   Lab Results  Component Value Date   WBC 6.0 07/03/2020   HGB 13.9 07/03/2020   HCT 42.3 07/03/2020   MCV 86.5 07/03/2020   PLT 260 07/03/2020   Lab Results  Component Value Date   NA 142 07/03/2020   K 4.0 07/03/2020   CO2 27 07/03/2020   GLUCOSE 78 07/03/2020   BUN 13 07/03/2020   CREATININE 0.89 07/03/2020   BILITOT 0.5 07/03/2020   ALKPHOS 73 05/15/2019   AST 29 07/03/2020   ALT 37 (H) 07/03/2020   PROT 7.3 07/03/2020   ALBUMIN 3.1 (L) 05/15/2019   CALCIUM 9.6 07/03/2020   ANIONGAP 10 05/15/2019   Lab Results  Component Value Date   CHOL 183 07/03/2020   Lab Results  Component Value Date   HDL 82 07/03/2020   Lab Results  Component Value Date   LDLCALC 85 07/03/2020   Lab Results  Component Value Date   TRIG 73 07/03/2020   Lab Results  Component Value Date   CHOLHDL 2.2 07/03/2020   Lab Results  Component Value Date   HGBA1C 5.5 07/03/2020      Assessment & Plan:     Problem List Items Addressed This Visit       Musculoskeletal and Integument   Excoriation (skin-picking) disorder - Primary    Chronic, historic condition Likely associated with anxiety  exacerbations Recommend adding Hydroxyzine 25 mg PO BID PRN to assist with itching and picking Follow up as needed       Relevant Medications   hydrOXYzine (VISTARIL) 25 MG capsule     Other   Anxiety    Chronic, historic condition Managed by psychiatry service at facility  Notable for associated excoriation of forearms with exacerbations She is currently taking Paxil and Ativan PRN  Recommend adding Hydroxyzine 25 mg PO BID PRN for anxiety and picking  Spoke with nurse at site who states she will coordinate with psychiatry service       Relevant Medications   hydrOXYzine (VISTARIL) 25 MG capsule     No follow-ups on file.   I, Marisal Swarey E Skylan Lara, PA-C, have reviewed all documentation for this visit. The documentation on 06/14/21 for the exam, diagnosis, procedures, and orders are all accurate and complete.   Yenifer Saccente, Oswaldo Conroy, PA-C MPH [facility]    Meds ordered this encounter  Medications   hydrOXYzine (VISTARIL) 25 MG capsule    Sig: Take 1 capsule (25 mg total) by mouth every 8 (eight) hours as needed.    Dispense:  30 capsule    Refill:  3    Follow-up: No follow-ups on file.    Janaysia Mcleroy E Aysen Shieh, PA-C

## 2021-06-14 NOTE — Assessment & Plan Note (Signed)
Chronic, historic condition ?Likely associated with anxiety exacerbations ?Recommend adding Hydroxyzine 25 mg PO BID PRN to assist with itching and picking ?Follow up as needed ? ?

## 2021-06-18 ENCOUNTER — Other Ambulatory Visit: Payer: Self-pay | Admitting: Family Medicine

## 2021-07-23 ENCOUNTER — Ambulatory Visit (INDEPENDENT_AMBULATORY_CARE_PROVIDER_SITE_OTHER): Payer: Medicare Other | Admitting: Internal Medicine

## 2021-07-23 ENCOUNTER — Encounter: Payer: Self-pay | Admitting: Internal Medicine

## 2021-07-23 VITALS — BP 122/84 | HR 84 | Temp 98.0°F | Resp 16 | Ht 65.0 in | Wt 188.8 lb

## 2021-07-23 DIAGNOSIS — L98491 Non-pressure chronic ulcer of skin of other sites limited to breakdown of skin: Secondary | ICD-10-CM

## 2021-07-23 DIAGNOSIS — E559 Vitamin D deficiency, unspecified: Secondary | ICD-10-CM | POA: Diagnosis not present

## 2021-07-23 DIAGNOSIS — E785 Hyperlipidemia, unspecified: Secondary | ICD-10-CM

## 2021-07-23 DIAGNOSIS — F72 Severe intellectual disabilities: Secondary | ICD-10-CM

## 2021-07-23 DIAGNOSIS — E039 Hypothyroidism, unspecified: Secondary | ICD-10-CM

## 2021-07-23 DIAGNOSIS — F424 Excoriation (skin-picking) disorder: Secondary | ICD-10-CM | POA: Diagnosis not present

## 2021-07-23 DIAGNOSIS — R32 Unspecified urinary incontinence: Secondary | ICD-10-CM | POA: Diagnosis not present

## 2021-07-23 DIAGNOSIS — L609 Nail disorder, unspecified: Secondary | ICD-10-CM | POA: Diagnosis not present

## 2021-07-23 DIAGNOSIS — I10 Essential (primary) hypertension: Secondary | ICD-10-CM

## 2021-07-23 DIAGNOSIS — F419 Anxiety disorder, unspecified: Secondary | ICD-10-CM

## 2021-07-23 MED ORDER — AMLODIPINE BESYLATE 5 MG PO TABS: 5.0000 mg | ORAL_TABLET | Freq: Every day | ORAL | 5 refills | Status: DC

## 2021-07-23 MED ORDER — GABAPENTIN 300 MG PO CAPS: 300.0000 mg | ORAL_CAPSULE | Freq: Every day | ORAL | 1 refills | Status: DC

## 2021-07-23 MED ORDER — VIACTIV CALCIUM PLUS D 650-12.5-40 MG-MCG PO CHEW: CHEWABLE_TABLET | ORAL | 10 refills | Status: DC

## 2021-07-23 MED ORDER — LEVOTHYROXINE SODIUM 100 MCG PO TABS: 100.0000 ug | ORAL_TABLET | Freq: Every day | ORAL | 2 refills | Status: DC

## 2021-07-23 MED ORDER — ZINC SULFATE 220 (50 ZN) MG PO CAPS: 220.0000 mg | ORAL_CAPSULE | Freq: Every day | ORAL | Status: DC

## 2021-07-23 MED ORDER — GERHARDT'S BUTT CREAM: 1.0000 "application " | TOPICAL_CREAM | Freq: Four times a day (QID) | CUTANEOUS | 1 refills | Status: DC | PRN

## 2021-07-23 MED ORDER — POTASSIUM CHLORIDE 20 MEQ PO PACK: PACK | ORAL | 10 refills | Status: DC

## 2021-07-23 MED ORDER — IPRATROPIUM-ALBUTEROL 20-100 MCG/ACT IN AERS: 1.0000 | INHALATION_SPRAY | Freq: Four times a day (QID) | RESPIRATORY_TRACT | 0 refills | Status: AC | PRN

## 2021-07-23 MED ORDER — THEREMS-M PO TABS: 1.0000 | ORAL_TABLET | Freq: Every day | ORAL | 3 refills | Status: DC

## 2021-07-23 MED ORDER — PAROXETINE HCL 30 MG PO TABS: ORAL_TABLET | ORAL | 11 refills | Status: DC

## 2021-07-23 MED ORDER — ATORVASTATIN CALCIUM 20 MG PO TABS: 20.0000 mg | ORAL_TABLET | Freq: Every day | ORAL | 5 refills | Status: DC

## 2021-07-23 MED ORDER — SACCHAROMYCES BOULARDII 250 MG PO CAPS: 250.0000 mg | ORAL_CAPSULE | Freq: Every day | ORAL | 0 refills | Status: AC

## 2021-07-23 MED ORDER — MINERIN CREME EX CREA: TOPICAL_CREAM | CUTANEOUS | 10 refills | Status: DC

## 2021-07-23 NOTE — Assessment & Plan Note (Signed)
Continue Vitamin D supplements, refilled today.  ?

## 2021-07-23 NOTE — Assessment & Plan Note (Signed)
Sees Psychiatry, refilled Paxil today. Also on Lorazepam as needed and Hydroxyzine as needed. ?

## 2021-07-23 NOTE — Assessment & Plan Note (Signed)
Blood pressure here stable and stable at group home. Refill Amlodipine 5 mg today.  ?

## 2021-07-23 NOTE — Assessment & Plan Note (Signed)
Chronic, being treated for anxiety. Skin ointments refilled today.  ?

## 2021-07-23 NOTE — Patient Instructions (Signed)
It was great seeing you today! ? ?Plan discussed at today's visit: ?-Blood work ordered today, results will be uploaded to [facility].  ?-Medications refilled ?-Depends order given as well as referral to podiatry  ? ?Follow up in: 6 months  ? ?Take care and let us know if you have any questions or concerns prior to your next visit. ? ?Dr. Rosana Berger ? ?

## 2021-07-23 NOTE — Progress Notes (Signed)
? ?Established Patient Office Visit ? ?Subjective:  ?Patient ID: Rebecca Bruce, female    DOB: 03-22-1961  Age: 61 y.o. MRN: [id number] ? ?CC:  ?Chief Complaint  ?Patient presents with  ? Follow-up  ?  FL2  ? Referral  ?  Podiatry foot problems?  ? Orders  ?  For depends  ? ? ?HPI ?Rebecca Bruce presents for follow up on chronic medical conditions. Here with her caretaker. She resides at [facility].  ? ?Complaining of black medial edge of left great toenail, is painful to the patient. Does not appear to be spreading. Also would like an order for Depends, as she is having some urinary incontinence at night. Denies urinary symptoms today. ? ?Hypertension: ?-Medications: Amlodipine 5 ?-Patient is compliant with above medications and reports no side effects. ?-Checking BP at home (average): 119-135/70-80 ? ?HLD: ?-Medications: Lipitor 20 ?-Patient is compliant with above medications and reports no side effects.  ?-Last lipid panel: Lipid Panel  ?   ?Component Value Date/Time  ? CHOL 183 07/03/2020 0951  ? CHOL 188 01/09/2015 0809  ? TRIG 73 07/03/2020 0951  ? HDL 82 07/03/2020 0951  ? HDL 78 01/09/2015 0809  ? CHOLHDL 2.2 07/03/2020 0951  ? VLDL 16 01/25/2016 0823  ? [place] 85 07/03/2020 0951  ? LABVLDL 14 01/09/2015 0809  ? ?Hypothyroidism: ?-Medications: Levothyroxine 100 mcg ?-Patient is compliant with the above medication (s) at the above dose and reports no medication side effects.  ?-Last TSH: 3/22 2.97 ?-S/p ablation after being diagnosed with hyperthyroidism ? ?MR/Behavorial Changes: ?-Current treatment: Paxil 30, Hydroxyzine PRN  ?-Seeing Psychiatrist  ? ?Health Maintenance: ?-Blood work due ?-Mammogram 11/22: Birads-1 ?-Colon cancer screening: Cologuard 10/22 negative  ? ? ?Past Medical History:  ?Diagnosis Date  ? GERD (gastroesophageal reflux disease)   ? Hypertension   ? Thyroid disease   ? ? ?Past Surgical History:  ?Procedure Laterality Date  ? DENTAL SURGERY    ? IR ANGIOGRAM SELECTIVE  EACH ADDITIONAL VESSEL  03/29/2019  ? IR ANGIOGRAM SELECTIVE EACH ADDITIONAL VESSEL  03/29/2019  ? IR ANGIOGRAM SELECTIVE EACH ADDITIONAL VESSEL  03/29/2019  ? IR ANGIOGRAM SELECTIVE EACH ADDITIONAL VESSEL  03/29/2019  ? IR ANGIOGRAM VISCERAL SELECTIVE  03/29/2019  ? IR ANGIOGRAM VISCERAL SELECTIVE  03/29/2019  ? IR US GUIDE VASC ACCESS RIGHT  03/29/2019  ? ? ?Family History  ?Problem Relation Age of Onset  ? Heart disease Mother   ? Breast cancer Neg Hx   ? ? ?Social History  ? ?Socioeconomic History  ? Marital status: Single  ?  Spouse name: Not on file  ? Number of children: 0  ? Years of education: Not on file  ? Highest education level: Not on file  ?Occupational History  ? Occupation: Disabled  ?Tobacco Use  ? Smoking status: Never  ? Smokeless tobacco: Never  ? Tobacco comments:  ?  smoking cessation materials not required  ?Vaping Use  ? Vaping Use: Never used  ?Substance and Sexual Activity  ? Alcohol use: No  ?  Alcohol/week: 0.0 standard drinks  ? Drug use: No  ? Sexual activity: Never  ?Other Topics Concern  ? Not on file  ?Social History Narrative  ? [facility] [place]  ? She goes to church with her brother every Sunday  ? Participate in group activities  ? Works part time at [employer]   ? ?Social Determinants of Health  ? ?Financial Resource Strain: Not on  file  ?Food Insecurity: Not on file  ?Transportation Needs: Not on file  ?Physical Activity: Not on file  ?Stress: Not on file  ?Social Connections: Not on file  ?Intimate Partner Violence: Not on file  ? ? ?Outpatient Medications Prior to Visit  ?Medication Sig Dispense Refill  ? acetaminophen (TYLENOL) 325 MG tablet Take 1-2 tablets (325-650 mg total) by mouth every 4 (four) hours as needed for mild pain.    ? amLODipine (NORVASC) 5 MG tablet Take 1 tablet (5 mg total) by mouth daily. 30 tablet 5  ? aspirin 81 MG EC tablet Take 1 tablet (81 mg total) by mouth daily. Swallow whole. (Patient not taking: Reported on  06/14/2021) 30 tablet 12  ? atorvastatin (LIPITOR) 20 MG tablet Take 1 tablet (20 mg total) by mouth daily at 6 PM. 30 tablet 5  ? Bioflavonoid Products (LIQUID C) LIQD TAKE 5 ML ('500MG'$ ) BY MOUTH ONCE DAILY (Patient not taking: Reported on 06/14/2021) 120 mL 10  ? fluconazole (DIFLUCAN) 150 MG tablet Take 1 tablet (150 mg total) by mouth every other day. (Patient not taking: Reported on 06/14/2021) 3 tablet 0  ? gabapentin (NEURONTIN) 300 MG capsule Take 1 capsule (300 mg total) by mouth at bedtime. 900 capsule 1  ? hydrOXYzine (VISTARIL) 25 MG capsule Take 1 capsule (25 mg total) by mouth every 8 (eight) hours as needed. 30 capsule 3  ? Ipratropium-Albuterol (COMBIVENT) 20-100 MCG/ACT AERS respimat Inhale 1 puff into the lungs every 6 (six) hours as needed for wheezing. 4 g 0  ? levothyroxine (SYNTHROID) 100 MCG tablet Take 1 tablet (100 mcg total) by mouth daily before breakfast. 30 tablet 2  ? LIQUID C 500 MG/5ML LIQD TAKE 5 ML ('500MG'$ ) BY MOUTH ONCE DAILY (Patient not taking: Reported on 06/14/2021) 120 mL 11  ? LORazepam (ATIVAN) 0.5 MG tablet Take 0.5 mg by mouth daily as needed for anxiety. Can take an extra pill 30 minutes after the first dose if needed    ? Multiple Vitamin (MULTIVITAMIN WITH MINERALS) TABS tablet Take 1 tablet by mouth daily.    ? Multiple Vitamins-Minerals (THEREMS-M) TABS TAKE 1 TABLET BY MOUTH ONCE DAILY 7 tablet 10  ? Nystatin (GERHARDT'S BUTT CREAM) CREA Apply 1 application topically 4 (four) times daily as needed for irritation. On abdominal fold, intertrigo 1 each 1  ? PARoxetine (PAXIL) 30 MG tablet TAKE 1 TABLET BY MOUTH AT BEDTIME *DO NOT CRUSH OR CHEW* *HAZARDOUS DRUG: WEAR GLOVES* 7 tablet 10  ? potassium chloride (KLOR-CON) 20 MEQ packet MIX 1 PACKET AS DIRECTED, THEN TAKE BY MOUTH TWICE DAILY *TAKE WITH FOOD* *DISSOLVE IN 4-8 OUNCES OF COLD JUICE OR WATER* 60 each 10  ? Probiotic Product (UP4 PROBIOTICS ADULT) CAPS Take 250 mg by mouth daily. (Patient not taking: Reported on 06/14/2021)  30 capsule 3  ? saccharomyces boulardii (FLORASTOR) 250 MG capsule Take 1 capsule (250 mg total) by mouth daily. 30 capsule 0  ? SHINGRIX injection INJECT 0.5ML INTRAMUSCULARLY FOR 1 DOSE *MIX THE 2 VIALS AS DIRECTED BEFORE DRAWING UP DOSE* (Patient not taking: Reported on 06/14/2021) 1 each 0  ? Skin Protectants, Misc. (MINERIN CREME) CREA APPLY 1 APPLIACTION  TOPICALLY ONCE DAILY  TO BILATERAL FEET 396 g 10  ? VIACTIV CALCIUM PLUS D 650-12.5-40 MG-MCG CHEW CHEW AND SWALLOW 1 PIECE BY MOUTH TWICE DAILY 100 tablet 10  ? zinc sulfate 220 (50 Zn) MG capsule Take 1 capsule (220 mg total) by mouth daily.    ? ?No  facility-administered medications prior to visit.  ? ? ?No Known Allergies ? ?ROS ?Review of Systems  ?All other systems reviewed and are negative. ? ?  ?Objective:  ?  ?Physical Exam ?Constitutional:   ?   Appearance: Normal appearance.  ?HENT:  ?   Head: Normocephalic and atraumatic.  ?   Mouth/Throat:  ?   Mouth: Mucous membranes are moist.  ?   Pharynx: Oropharynx is clear.  ?Eyes:  ?   Conjunctiva/sclera: Conjunctivae normal.  ?Cardiovascular:  ?   Rate and Rhythm: Normal rate and regular rhythm.  ?Pulmonary:  ?   Effort: Pulmonary effort is normal.  ?   Breath sounds: Normal breath sounds.  ?Abdominal:  ?   General: There is no distension.  ?   Palpations: Abdomen is soft.  ?   Tenderness: There is no abdominal tenderness.  ?Musculoskeletal:  ?   Right lower leg: No edema.  ?   Left lower leg: No edema.  ?Skin: ?   General: Skin is warm and dry.  ?   Comments: Black discoloration of left great toenail  ?Neurological:  ?   Mental Status: She is alert. Mental status is at baseline.  ? ? ?BP 122/84   Pulse 84   Temp 98 ?F (36.7 ?C)   Resp 16   Ht '5\' 5"'$  (1.651 m)   Wt 188 lb 12.8 oz (85.6 kg)   SpO2 100%   BMI 31.42 kg/m?  ?Wt Readings from Last 3 Encounters:  ?06/14/21 186 lb 11.2 oz (84.7 kg)  ?01/22/21 181 lb 3.2 oz (82.2 kg)  ?07/22/20 181 lb (82.1 kg)  ? ? ? ?Health Maintenance Due  ?Topic Date Due   ? COVID-19 Vaccine (4 - Booster for Moderna series) 04/13/2020  ? ? ?There are no preventive care reminders to display for this patient. ? ?Lab Results  ?Component Value Date  ? TSH 2.97 07/03/2020  ? ?Lab Re

## 2021-07-23 NOTE — Assessment & Plan Note (Signed)
Recheck lipids today, refilled Lipitor 20.  ?

## 2021-07-23 NOTE — Assessment & Plan Note (Signed)
Recheck TSH, refill Levothyroxine 100 mcg. ?

## 2021-07-24 LAB — COMPLETE METABOLIC PANEL WITH GFR
AG Ratio: 1.5 (calc) (ref 1.0–2.5)
ALT: 22 U/L (ref 6–29)
AST: 22 U/L (ref 10–35)
Albumin: 4.4 g/dL (ref 3.6–5.1)
Alkaline phosphatase (APISO): 98 U/L (ref 37–153)
BUN: 14 mg/dL (ref 7–25)
CO2: 25 mmol/L (ref 20–32)
Calcium: 9.5 mg/dL (ref 8.6–10.4)
Chloride: 104 mmol/L (ref 98–110)
Creat: 0.9 mg/dL (ref 0.50–1.03)
Globulin: 2.9 g/dL (calc) (ref 1.9–3.7)
Glucose, Bld: 85 mg/dL (ref 65–99)
Potassium: 4.1 mmol/L (ref 3.5–5.3)
Sodium: 140 mmol/L (ref 135–146)
Total Bilirubin: 0.5 mg/dL (ref 0.2–1.2)
Total Protein: 7.3 g/dL (ref 6.1–8.1)
eGFR: 74 mL/min/{1.73_m2} (ref >=60)

## 2021-07-24 LAB — CBC WITH DIFFERENTIAL/PLATELET
Absolute Monocytes: 691 cells/uL (ref 200–950)
Basophils Absolute: 51 cells/uL (ref 0–200)
Basophils Relative: 0.8 %
Eosinophils Absolute: 122 cells/uL (ref 15–500)
Eosinophils Relative: 1.9 %
HCT: 43.7 % (ref 35.0–45.0)
Hemoglobin: 14.2 g/dL (ref 11.7–15.5)
Lymphs Abs: 2682 cells/uL (ref 850–3900)
MCH: 27.6 pg (ref 27.0–33.0)
MCHC: 32.5 g/dL (ref 32.0–36.0)
MCV: 85 fL (ref 80.0–100.0)
MPV: 9.7 fL (ref 7.5–12.5)
Monocytes Relative: 10.8 %
Neutro Abs: 2854 cells/uL (ref 1500–7800)
Neutrophils Relative %: 44.6 %
Platelets: 274 10*3/uL (ref 140–400)
RBC: 5.14 10*6/uL — ABNORMAL HIGH (ref 3.80–5.10)
RDW: 12.2 % (ref 11.0–15.0)
Total Lymphocyte: 41.9 %
WBC: 6.4 10*3/uL (ref 3.8–10.8)

## 2021-07-24 LAB — TSH: TSH: 12.32 mIU/L — ABNORMAL HIGH (ref 0.40–4.50)

## 2021-07-24 LAB — LIPID PANEL
Cholesterol: 187 mg/dL (ref <200)
HDL: 86 mg/dL (ref >=50)
LDL Cholesterol (Calc): 84 mg/dL (calc)
Non-HDL Cholesterol (Calc): 101 mg/dL (calc) (ref <130)
Total CHOL/HDL Ratio: 2.2 (calc) (ref <5.0)
Triglycerides: 81 mg/dL (ref <150)

## 2021-08-03 DIAGNOSIS — L600 Ingrowing nail: Secondary | ICD-10-CM | POA: Diagnosis not present

## 2021-08-03 DIAGNOSIS — M79674 Pain in right toe(s): Secondary | ICD-10-CM | POA: Diagnosis not present

## 2021-08-03 DIAGNOSIS — L851 Acquired keratosis [keratoderma] palmaris et plantaris: Secondary | ICD-10-CM | POA: Diagnosis not present

## 2021-08-03 DIAGNOSIS — M79675 Pain in left toe(s): Secondary | ICD-10-CM | POA: Diagnosis not present

## 2021-08-04 ENCOUNTER — Telehealth: Payer: Self-pay

## 2021-08-04 NOTE — Telephone Encounter (Signed)
Pt is needing a DC order for pull-ups. Call [phone] (her care giver) when ready.

## 2021-08-04 NOTE — Telephone Encounter (Signed)
Lvm letting know prescription is ready for pickup.

## 2021-10-19 ENCOUNTER — Telehealth: Payer: Self-pay | Admitting: Internal Medicine

## 2021-10-19 DIAGNOSIS — E039 Hypothyroidism, unspecified: Secondary | ICD-10-CM

## 2021-10-20 ENCOUNTER — Other Ambulatory Visit: Payer: Self-pay

## 2021-10-20 DIAGNOSIS — E039 Hypothyroidism, unspecified: Secondary | ICD-10-CM

## 2021-10-20 MED ORDER — LEVOTHYROXINE SODIUM 100 MCG PO TABS: 100.0000 ug | ORAL_TABLET | Freq: Every day | ORAL | 2 refills | Status: DC

## 2021-10-20 NOTE — Telephone Encounter (Signed)
Duplicate request- filled by office 7/12 #30 2RF Requested Prescriptions  Pending Prescriptions Disp Refills  . levothyroxine (SYNTHROID) 100 MCG tablet [Pharmacy Med Name: Levothyroxine Sodium 100 MCG Tablet] 7 tablet 10    Sig: @'@TAKE'$  1 TABLET (100 MCG TOTAL) BY MOUTH DAILY BEFORE BREAKFAST.     Endocrinology:  Hypothyroid Agents Failed - 10/19/2021  4:14 PM      Failed - TSH in normal range and within 360 days    TSH  Date Value Ref Range Status  07/23/2021 12.32 (H) 0.40 - 4.50 mIU/L Final         Passed - Valid encounter within last 12 months    Recent Outpatient Visits          2 months ago Hypertension, benign   [place], DO   4 months ago Excoriation (skin-picking) disorder   [facility] Mecum, Dani Gobble, PA-C   9 months ago Atherosclerosis of aorta [facility])   [facility] Steele Sizer, MD   1 year ago Mild intellectual disabilities   [facility] Steele Sizer, MD   1 year ago Atherosclerosis of aorta [facility])   [facility] Steele Sizer, MD      Future Appointments            In 9 months Ancil Boozer, [name], MD [facility], [facility]

## 2021-11-16 DIAGNOSIS — F339 Major depressive disorder, recurrent, unspecified: Secondary | ICD-10-CM | POA: Diagnosis not present

## 2022-01-13 ENCOUNTER — Ambulatory Visit (INDEPENDENT_AMBULATORY_CARE_PROVIDER_SITE_OTHER): Payer: Medicare Other | Admitting: Emergency Medicine

## 2022-01-13 DIAGNOSIS — Z23 Encounter for immunization: Secondary | ICD-10-CM

## 2022-01-18 ENCOUNTER — Other Ambulatory Visit: Payer: Self-pay | Admitting: Internal Medicine

## 2022-01-18 DIAGNOSIS — I10 Essential (primary) hypertension: Secondary | ICD-10-CM

## 2022-01-18 DIAGNOSIS — E785 Hyperlipidemia, unspecified: Secondary | ICD-10-CM

## 2022-01-18 DIAGNOSIS — E039 Hypothyroidism, unspecified: Secondary | ICD-10-CM

## 2022-01-19 NOTE — Telephone Encounter (Signed)
Requested Prescriptions  Pending Prescriptions Disp Refills  . atorvastatin (LIPITOR) 20 MG tablet [Pharmacy Med Name: Atorvastatin Calcium 20 MG Tablet] 90 tablet 1    Sig: TAKE 1 TABLET BY MOUTH ONCE DAILY AT 6PM     Cardiovascular:  Antilipid - Statins Failed - 01/18/2022  4:23 PM      Failed - Lipid Panel in normal range within the last 12 months    Cholesterol, Total  Date Value Ref Range Status  01/09/2015 188 100 - 199 mg/dL Final   Cholesterol  Date Value Ref Range Status  07/23/2021 187 <200 mg/dL Final   LDL Cholesterol (Calc)  Date Value Ref Range Status  07/23/2021 84 mg/dL (calc) Final    Comment:    Reference range: <100 . Desirable range <100 mg/dL for primary prevention;   <70 mg/dL for patients with CHD or diabetic patients  with > or = 2 CHD risk factors. Marland Kitchen LDL-C is now calculated using the Martin-Hopkins  calculation, which is a validated novel method providing  better accuracy than the Friedewald equation in the  estimation of LDL-C.  Cresenciano Genre et al. Annamaria Helling. 1601;093(23): 2061-2068  (http://education.[employer].com/faq/FAQ164)    HDL  Date Value Ref Range Status  07/23/2021 86 > OR = 50 mg/dL Final  01/09/2015 78 >39 mg/dL Final    Comment:    According to ATP-III Guidelines, HDL-C >59 mg/dL is considered a negative risk factor for CHD.    Triglycerides  Date Value Ref Range Status  07/23/2021 81 <150 mg/dL Final         Passed - Patient is not pregnant      Passed - Valid encounter within last 12 months    Recent Outpatient Visits          6 months ago Hypertension, benign   [place], DO   7 months ago Excoriation (skin-picking) disorder   [place], PA-C   12 months ago Atherosclerosis of aorta [facility])   [facility] Steele Sizer, MD   1 year ago Mild intellectual disabilities   [facility] Steele Sizer, MD    1 year ago Atherosclerosis of aorta [facility])   [facility] Steele Sizer, MD      Future Appointments            In 6 months Steele Sizer, MD [facility], [facility]           . amLODipine (NORVASC) 5 MG tablet [Pharmacy Med Name: amLODIPine Besylate 5 MG Tablet] 90 tablet 0    Sig: TAKE 1 TABLET (5 MG TOTAL) BY MOUTH DAILY.     Cardiovascular: Calcium Channel Blockers 2 Passed - 01/18/2022  4:23 PM      Passed - Last BP in normal range    BP Readings from Last 1 Encounters:  07/23/21 122/84         Passed - Last Heart Rate in normal range    Pulse Readings from Last 1 Encounters:  07/23/21 84         Passed - Valid encounter within last 6 months    Recent Outpatient Visits          6 months ago Hypertension, benign   [place], DO   7 months ago Excoriation (skin-picking) disorder   [place], PA-C   12 months ago Atherosclerosis of aorta ([place])  [facility] Steele Sizer, MD   1 year ago Mild intellectual disabilities   [facility] Steele Sizer, MD   1 year ago Atherosclerosis of aorta [facility])   [facility] Steele Sizer, MD      Future Appointments            In 6 months Ancil Boozer, [name], MD [facility], [place]           . levothyroxine (SYNTHROID) 100 MCG tablet [Pharmacy Med Name: Levothyroxine Sodium 100 MCG Tablet] 90 tablet 1    Sig: @'@TAKE'$  1 TABLET BY MOUTH ONCE DAILY BEFORE BREAKFAST     Endocrinology:  Hypothyroid Agents Failed - 01/18/2022  4:23 PM      Failed - TSH in normal range and within 360 days    TSH  Date Value Ref Range Status  07/23/2021 12.32 (H) 0.40 - 4.50 mIU/L Final         Passed - Valid encounter within last 12 months    Recent Outpatient Visits          6 months ago Hypertension, benign   [place], DO   7 months ago Excoriation (skin-picking) disorder   [place], PA-C   12 months ago Atherosclerosis of aorta [facility])   [facility] Steele Sizer, MD   1 year ago Mild intellectual disabilities   [facility] Steele Sizer, MD   1 year ago Atherosclerosis of aorta [facility])   [facility] Steele Sizer, MD      Future Appointments            In 6 months Ancil Boozer, [name], MD [facility], [facility]

## 2022-01-31 ENCOUNTER — Other Ambulatory Visit: Payer: Self-pay | Admitting: Family Medicine

## 2022-01-31 DIAGNOSIS — Z1231 Encounter for screening mammogram for malignant neoplasm of breast: Secondary | ICD-10-CM

## 2022-03-08 ENCOUNTER — Ambulatory Visit
Admission: RE | Admit: 2022-03-08 | Discharge: 2022-03-08 | Disposition: A | Discharge status: Home / Self Care | Payer: Medicare Other | Source: Ambulatory Visit | Attending: Family Medicine | Admitting: Family Medicine

## 2022-03-08 DIAGNOSIS — Z1231 Encounter for screening mammogram for malignant neoplasm of breast: Secondary | ICD-10-CM | POA: Insufficient documentation

## 2022-03-12 ENCOUNTER — Other Ambulatory Visit: Payer: Self-pay | Admitting: Family Medicine

## 2022-05-05 ENCOUNTER — Encounter: Payer: Self-pay | Admitting: Family Medicine

## 2022-05-05 ENCOUNTER — Ambulatory Visit (INDEPENDENT_AMBULATORY_CARE_PROVIDER_SITE_OTHER): Payer: Medicare Other | Admitting: Family Medicine

## 2022-05-05 VITALS — BP 124/70 | HR 85 | Temp 97.7°F | Resp 16 | Ht 65.0 in | Wt 183.4 lb

## 2022-05-05 DIAGNOSIS — T2111XA Burn of first degree of chest wall, initial encounter: Secondary | ICD-10-CM | POA: Diagnosis not present

## 2022-05-05 NOTE — Progress Notes (Signed)
Name: Rebecca Bruce   MRN: [id number]    DOB: 1960-12-31   Date:05/05/2022       Progress Note  Subjective  Chief Complaint  Right breast skin irritation  HPI  Patient came in with Rebecca Bruce transport team. She was in no acute distress, per note she developed a rash on right breast a few days ago. Patient told caregiver it was a burn while taking a shower and that is what looks like.    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   Hyperglycemia 07/13/2018   Pre-diabetes 01/05/2017   Dyslipidemia 07/02/2015   History of hyperthyroidism 07/02/2015   History of radioactive iodine thyroid ablation 07/02/2015   Hypothyroidism (acquired) 07/02/2015   Hypertension, benign 07/02/2015   Anxiety 07/02/2015   Vitamin D deficiency 07/02/2015   Insomnia 07/02/2015   OA (osteoarthritis) of knee 07/02/2015   GERD (gastroesophageal reflux disease) 07/02/2015    Past Surgical History:  Procedure Laterality Date   DENTAL SURGERY     IR ANGIOGRAM SELECTIVE EACH ADDITIONAL VESSEL  03/29/2019   IR ANGIOGRAM SELECTIVE EACH ADDITIONAL VESSEL  03/29/2019   IR ANGIOGRAM SELECTIVE EACH ADDITIONAL VESSEL  03/29/2019   IR ANGIOGRAM SELECTIVE EACH ADDITIONAL VESSEL  03/29/2019   IR ANGIOGRAM VISCERAL SELECTIVE  03/29/2019   IR ANGIOGRAM VISCERAL SELECTIVE  03/29/2019   IR US GUIDE VASC ACCESS RIGHT  03/29/2019    Family History  Problem Relation Age of Onset   Heart disease Mother    Breast cancer Neg Hx     Social History   Tobacco Use   Smoking status: Never   Smokeless tobacco: Never   Tobacco comments:    smoking cessation materials not required  Substance Use Topics   Alcohol use: No    Alcohol/week: 0.0 standard drinks of alcohol     Current Outpatient Medications:    acetaminophen  (TYLENOL) 325 MG tablet, Take 1-2 tablets (325-650 mg total) by mouth every 4 (four) hours as needed for mild pain., Disp:  , Rfl:    amLODipine (NORVASC) 5 MG tablet, TAKE 1 TABLET (5 MG TOTAL) BY MOUTH DAILY., Disp: 90 tablet, Rfl: 0   Ascorbic Acid (VITAMIN C) 500 MG/5ML LIQD, TAKE 5MLS ('500MG'$ ) BY MOUTH ONCE DAILY, Disp: 120 mL, Rfl: 10   atorvastatin (LIPITOR) 20 MG tablet, TAKE 1 TABLET BY MOUTH ONCE DAILY AT 6PM, Disp: 90 tablet, Rfl: 1   Calcium-Vitamin D-Vitamin K (VIACTIV CALCIUM PLUS D) 650-12.5-40 MG-MCG-MCG CHEW, CHEW AND SWALLOW 1 PIECE BY MOUTH TWICE DAILY, Disp: 100 tablet, Rfl: 10   gabapentin (NEURONTIN) 300 MG capsule, Take 1 capsule (300 mg total) by mouth at bedtime., Disp: 900 capsule, Rfl: 1   hydrOXYzine (VISTARIL) 25 MG capsule, Take 1 capsule (25 mg total) by mouth every 8 (eight) hours as needed., Disp: 30 capsule, Rfl: 3   Ipratropium-Albuterol (COMBIVENT) 20-100 MCG/ACT AERS respimat, Inhale 1 puff into the lungs every 6 (six) hours as needed for wheezing., Disp: 4 g, Rfl: 0   levothyroxine (SYNTHROID) 100 MCG tablet, @'@TAKE'$  1 TABLET BY MOUTH ONCE DAILY BEFORE BREAKFAST, Disp: 90 tablet, Rfl: 1   LORazepam (ATIVAN) 0.5 MG tablet, Take 0.5 mg by mouth daily as needed for anxiety. Can take an extra pill 30 minutes after the first dose if needed, Disp: , Rfl:  Multiple Vitamin (MULTIVITAMIN WITH MINERALS) TABS tablet, Take 1 tablet by mouth daily., Disp:  , Rfl:    Multiple Vitamins-Minerals (THEREMS-M) TABS, Take 1 tablet by mouth daily., Disp: 90 tablet, Rfl: 3   Nystatin (GERHARDT'S BUTT CREAM) CREA, Apply 1 application. topically 4 (four) times daily as needed for irritation. On abdominal fold, intertrigo, Disp: 1 each, Rfl: 1   PARoxetine (PAXIL) 30 MG tablet, TAKE 1 TABLET BY MOUTH AT BEDTIME *DO NOT CRUSH OR CHEW* *HAZARDOUS DRUG: WEAR GLOVES*, Disp: 30 tablet, Rfl: 11   potassium chloride (KLOR-CON) 20 MEQ packet, MIX 1 PACKET AS DIRECTED, THEN TAKE BY MOUTH TWICE  DAILY *TAKE WITH FOOD* *DISSOLVE IN 4-8 OUNCES OF COLD JUICE OR WATER*, Disp: 60 each, Rfl: 10   saccharomyces boulardii (FLORASTOR) 250 MG capsule, Take 1 capsule (250 mg total) by mouth daily., Disp: 30 capsule, Rfl: 0   Skin Protectants, Misc. (MINERIN CREME) CREA, APPLY 1 APPLIACTION  TOPICALLY ONCE DAILY  TO BILATERAL FEET, Disp: 396 g, Rfl: 10   zinc sulfate 220 (50 Zn) MG capsule, Take 1 capsule (220 mg total) by mouth daily., Disp: , Rfl:   No Known Allergies  I personally reviewed active problem list, medication list with the patient/caregiver today.   ROS  Ten systems reviewed and is negative except as mentioned in HPI   Objective  Vitals:   05/05/22 1135  BP: 124/70  Pulse: 85  Resp: 16  Temp: 97.7 F (36.5 C)  TempSrc: Oral  SpO2: 99%  Weight: 183 lb 6.4 oz (83.2 kg)  Height: '5\' 5"'$  (1.651 m)    Body mass index is 30.52 kg/m.  Physical Exam  Constitutional: Patient appears well-developed and well-nourished. Obese  No distress.  HEENT: head atraumatic, normocephalic, pupils equal and reactive to light, neck supple Cardiovascular: Normal rate, regular rhythm and normal heart sounds.  No murmur heard. No BLE edema. Pulmonary/Chest: Effort normal and breath sounds normal. No respiratory distress. Abdominal: Soft.  There is no tenderness. Skin: see attached photos. Not painful, erythematous area on anterior chest wall, central to the right , no blister. Looks like superficial burn Psychiatric: Patient has a normal mood and affect. behavior is normal. Pleasant and cooperative     05/05/2022   11:34 AM 06/14/2021    9:11 AM 01/22/2021    8:46 AM 07/22/2020   10:11 AM 07/03/2020    8:55 AM  Depression screen PHQ 2/9  Decreased Interest 0 0 0 0 0  Down, Depressed, Hopeless 0 0 0 0 0  PHQ - 2 Score 0 0 0 0 0  Altered sleeping 0 0     Tired, decreased energy 0 0     Change in appetite 0 0     Feeling bad or failure about yourself  0 0     Trouble concentrating 0 0      Moving slowly or fidgety/restless 0 0     Suicidal thoughts 0 0     PHQ-9 Score 0 0       phq 9 is negative   Fall Risk:    05/05/2022   11:34 AM 07/23/2021   11:34 AM 06/14/2021    9:11 AM 01/22/2021    8:45 AM 07/22/2020   10:11 AM  Fall Risk   Falls in the past year? 0 0 0 1 0  Number falls in past yr:  0  0 0  Injury with Fall?  0  0 0  Risk for fall due to : No  Fall Risks   No Fall Risks   Follow up Falls prevention discussed;Education provided  Falls prevention discussed Falls prevention discussed      Functional Status Survey: Is the patient deaf or have difficulty hearing?: No Does the patient have difficulty seeing, even when wearing glasses/contacts?: No Does the patient have difficulty concentrating, remembering, or making decisions?: Yes Does the patient have difficulty walking or climbing stairs?: Yes Does the patient have difficulty dressing or bathing?: Yes Does the patient have difficulty doing errands alone such as visiting a doctor's office or shopping?: Yes    Assessment & Plan  1. Superficial burn of chest wall, initial encounter  She lives in a group home, explained importance of decreasing water temperature of the house, she needs to be supervised when bathing, at least check water before she gets in the shower, she is not having pain and advised lotion or vaseline if needed. Patient advised not to scratch the area.   Patient in good spirits, we called Gwyndolyn Saxon at the group home to explain

## 2022-05-06 ENCOUNTER — Telehealth: Payer: Self-pay | Admitting: Family Medicine

## 2022-05-06 NOTE — Telephone Encounter (Unsigned)
Copied from [place] [id number]. Topic: Referral - Request for Referral >> May 06, 2022  3:38 PM Rosanne Ashing P wrote: Pt needs a referral for a dermatologist.  She said that she was in yesterday to see Dr. Ancil Boozer.  CB#[phone]

## 2022-05-09 ENCOUNTER — Other Ambulatory Visit: Payer: Self-pay | Admitting: Family Medicine

## 2022-05-09 ENCOUNTER — Telehealth: Payer: Self-pay

## 2022-05-09 DIAGNOSIS — T2111XA Burn of first degree of chest wall, initial encounter: Secondary | ICD-10-CM

## 2022-05-09 NOTE — Telephone Encounter (Signed)
Returned call to let patient her know of dermatology wait times and advice per Dr. Ancil Boozer. Left voicemail.

## 2022-05-12 DIAGNOSIS — L280 Lichen simplex chronicus: Secondary | ICD-10-CM | POA: Diagnosis not present

## 2022-05-19 NOTE — Telephone Encounter (Signed)
done 

## 2022-06-09 ENCOUNTER — Other Ambulatory Visit: Payer: Self-pay

## 2022-06-09 ENCOUNTER — Telehealth: Payer: Self-pay | Admitting: Family Medicine

## 2022-06-09 DIAGNOSIS — F424 Excoriation (skin-picking) disorder: Secondary | ICD-10-CM

## 2022-06-09 MED ORDER — MINERIN CREME EX CREA: TOPICAL_CREAM | CUTANEOUS | 10 refills | Status: AC

## 2022-06-09 NOTE — Telephone Encounter (Signed)
Copied from [place]. Topic: General - Inquiry >> Jun 09, 2022  2:00 PM Penni Bombard wrote: Reason for CRM: Adonis Huguenin from [name] called about patients mineral cream for her feet.  They want to know if the patient should continue to use or if it should be DC 'd.  She said they need a refill if she should still use it.  She said the state inspector is there and needs to know.  She uses [facility]  CB# [phone]

## 2022-06-09 NOTE — Telephone Encounter (Signed)
Called Rebecca Bruce and let her know to continue as needed. Refill sent in.

## 2022-06-13 DIAGNOSIS — L280 Lichen simplex chronicus: Secondary | ICD-10-CM | POA: Diagnosis not present

## 2022-07-14 ENCOUNTER — Other Ambulatory Visit: Payer: Self-pay | Admitting: Family Medicine

## 2022-07-14 ENCOUNTER — Other Ambulatory Visit: Payer: Self-pay | Admitting: Internal Medicine

## 2022-07-14 NOTE — Telephone Encounter (Signed)
Requested Prescriptions  Pending Prescriptions Disp Refills   potassium chloride (KLOR-CON) 20 MEQ packet [Pharmacy Med Name: Potassium Chloride 20 MEQ Packet] 60 each 0    Sig: MIX 1 PACKET AS DIRECTED, THEN TAKE BY MOUTH TWICE DAILY *TAKE WITH FOOD* *DISSOLVE IN 4-8 OUNCES OF COLD JUICE OR WATER*     Endocrinology:  Minerals - Potassium Supplementation Passed - 07/14/2022 12:03 PM      Passed - K in normal range and within 360 days    Potassium  Date Value Ref Range Status  07/23/2021 4.1 3.5 - 5.3 mmol/L Final         Passed - Cr in normal range and within 360 days    Creat  Date Value Ref Range Status  07/23/2021 0.90 0.50 - 1.03 mg/dL Final         Passed - Valid encounter within last 12 months    Recent Outpatient Visits           2 months ago Superficial burn of chest wall, initial encounter   [facility] Steele Sizer, MD   11 months ago Hypertension, benign   [facility] Teodora Medici, DO   1 year ago Excoriation (skin-picking) disorder   [place] Mecum, Dani Gobble, PA-C   1 year ago Atherosclerosis of aorta [facility])   [facility] Steele Sizer, MD   1 year ago Mild intellectual disabilities   [facility] Steele Sizer, MD       Future Appointments             In 1 week Steele Sizer, MD [facility], [facility]

## 2022-07-19 ENCOUNTER — Other Ambulatory Visit: Payer: Self-pay | Admitting: Internal Medicine

## 2022-07-19 DIAGNOSIS — F419 Anxiety disorder, unspecified: Secondary | ICD-10-CM

## 2022-07-19 DIAGNOSIS — I10 Essential (primary) hypertension: Secondary | ICD-10-CM

## 2022-07-19 DIAGNOSIS — F424 Excoriation (skin-picking) disorder: Secondary | ICD-10-CM

## 2022-07-19 DIAGNOSIS — F72 Severe intellectual disabilities: Secondary | ICD-10-CM

## 2022-07-20 NOTE — Telephone Encounter (Signed)
Requested Prescriptions  Pending Prescriptions Disp Refills   PARoxetine (PAXIL) 30 MG tablet [Pharmacy Med Name: PARoxetine HCl 30 MG Tablet] 90 tablet 0    Sig: TAKE 1 TABLET BY MOUTH EVERY NIGHT AT BEDTIME *DO NOT CRUSH OR CHEW* *HAZARDOUS DRUG: WEAR GLOVES*     Psychiatry:  Antidepressants - SSRI Passed - 07/19/2022  4:14 PM      Passed - Valid encounter within last 6 months    Recent Outpatient Visits           2 months ago Superficial burn of chest wall, initial encounter   [facility] Alba Cory, MD   12 months ago Hypertension, benign   [facility] Margarita Mail, DO   1 year ago Excoriation (skin-picking) disorder   [facility] Mecum, Oswaldo Conroy, PA-C   1 year ago Atherosclerosis of aorta [facility])   [facility] Alba Cory, MD   1 year ago Mild intellectual disabilities   [facility] Alba Cory, MD       Future Appointments             In 5 days Alba Cory, MD [facility], [facility]             Multiple Vitamins-Minerals (THEREMS-M) TABS [Pharmacy Med Name: Therems-M Tablet] 90 tablet 0    Sig: TAKE 1 TABLET BY MOUTH ONCE DAILY     There is no refill protocol information for this order     amLODipine (NORVASC) 5 MG tablet [Pharmacy Med Name: amLODIPine Besylate 5 MG Tablet] 90 tablet 0    Sig: TAKE 1 TABLET BY MOUTH ONCE DAILY     Cardiovascular: Calcium Channel Blockers 2 Passed - 07/19/2022  4:14 PM      Passed - Last BP in normal range    BP Readings from Last 1 Encounters:  05/05/22 124/70         Passed - Last Heart Rate in normal range    Pulse Readings from Last 1 Encounters:  05/05/22 85         Passed - Valid encounter within last 6 months    Recent Outpatient Visits           2 months ago Superficial burn of chest wall, initial encounter   [facility] Alba Cory, MD   12 months ago Hypertension, benign   [facility] Margarita Mail, DO   1 year ago Excoriation (skin-picking) disorder   [facility] Mecum, Oswaldo Conroy, PA-C   1 year ago Atherosclerosis of aorta [facility])   [facility] Alba Cory, MD   1 year ago Mild intellectual disabilities   [facility] Alba Cory, MD       Future Appointments             In 5 days Alba Cory, MD [facility], [facility]

## 2022-07-22 NOTE — Progress Notes (Unsigned)
Name: Rebecca Bruce   MRN: [id number]    DOB: 12/14/1960   Date:07/25/2022       Progress Note  Subjective  Chief Complaint  FL2  HPI  HTN: taking medications, no side effects, denies chest pain or palpitation. She came in today with Rebecca Bruce is at goal here and at home.  She lives in a group home, Rebecca Bruce    Hyperlipidemia: taking medication as prescribed and no problems, denies myalgias. Last LDL was 85    Hypothyroidism : used to see  Dr. Dario Bruce, s/p ablation for hyperthyroidism, doing well on Synthroid 100 mcg daily ,last TSH was at goal in March 2022 , we will recheck level today    MR: stable, sees psychiatrist, Dr. Ave Bruce  for her behavior, she lives in a group home She needs assistance bathing and dressing, managing her money. 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 Unchanged     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   Hyperglycemia 07/13/2018   Pre-diabetes 01/05/2017   Dyslipidemia 07/02/2015   History of hyperthyroidism 07/02/2015   History of radioactive iodine thyroid ablation 07/02/2015   Hypothyroidism (acquired) 07/02/2015   Hypertension, benign 07/02/2015   Anxiety 07/02/2015   Vitamin D deficiency 07/02/2015   Insomnia 07/02/2015   OA (osteoarthritis) of knee 07/02/2015    GERD (gastroesophageal reflux disease) 07/02/2015    Past Surgical History:  Procedure Laterality Date   DENTAL SURGERY     IR ANGIOGRAM SELECTIVE EACH ADDITIONAL VESSEL  03/29/2019   IR ANGIOGRAM SELECTIVE EACH ADDITIONAL VESSEL  03/29/2019   IR ANGIOGRAM SELECTIVE EACH ADDITIONAL VESSEL  03/29/2019   IR ANGIOGRAM SELECTIVE EACH ADDITIONAL VESSEL  03/29/2019   IR ANGIOGRAM VISCERAL SELECTIVE  03/29/2019   IR ANGIOGRAM VISCERAL SELECTIVE  03/29/2019   IR US GUIDE VASC ACCESS RIGHT  03/29/2019    Family History  Problem Relation Age of Onset   Heart disease Mother    Breast cancer Neg Hx     Social History   Tobacco Use   Smoking status: Never   Smokeless tobacco: Never   Tobacco comments:    smoking cessation materials not required  Substance Use Topics   Alcohol use: No    Alcohol/week: 0.0 standard drinks of alcohol     Current Outpatient Medications:    acetaminophen (TYLENOL) 325 MG tablet, Take 1-2 tablets (325-650 mg total) by mouth every 4 (four) hours as needed for mild pain., Disp:  , Rfl:    amLODipine (NORVASC) 5 MG tablet, TAKE 1 TABLET BY MOUTH ONCE DAILY, Disp: 90 tablet, Rfl: 0   atorvastatin (LIPITOR) 20 MG tablet, TAKE 1 TABLET BY MOUTH ONCE DAILY AT 6PM, Disp: 90 tablet, Rfl: 1   Calcium-Vitamin D-Vitamin  K (VIACTIV CALCIUM PLUS D) 650-12.5-40 MG-MCG-MCG CHEW, CHEW AND SWALLOW 1 PIECE BY MOUTH TWICE DAILY, Disp: 100 tablet, Rfl: 10   gabapentin (NEURONTIN) 300 MG capsule, Take 1 capsule (300 mg total) by mouth at bedtime., Disp: 900 capsule, Rfl: 1   hydrOXYzine (VISTARIL) 25 MG capsule, Take 1 capsule (25 mg total) by mouth every 8 (eight) hours as needed., Disp: 30 capsule, Rfl: 3   Ipratropium-Albuterol (COMBIVENT) 20-100 MCG/ACT AERS respimat, Inhale 1 puff into the lungs every 6 (six) hours as needed for wheezing., Disp: 4 g, Rfl: 0   levothyroxine (SYNTHROID) 100 MCG tablet, @@TAKE  1 TABLET BY MOUTH ONCE DAILY BEFORE BREAKFAST, Disp: 90 tablet, Rfl:  1   LIQUID C 500 MG/5ML LIQD liquid, TAKE 5 ML ( ) BY MOUTH ONCE DAILY, Disp: 120 mL, Rfl: 10   LORazepam (ATIVAN) 0.5 MG tablet, Take 0.5 mg by mouth daily as needed for anxiety. Can take an extra pill 30 minutes after the first dose if needed, Disp: , Rfl:    Multiple Vitamin (MULTIVITAMIN WITH MINERALS) TABS tablet, Take 1 tablet by mouth daily., Disp:  , Rfl:    Multiple Vitamins-Minerals (THEREMS-M) TABS, TAKE 1 TABLET BY MOUTH ONCE DAILY, Disp: 90 tablet, Rfl: 0   Nystatin (GERHARDT'S BUTT CREAM) CREA, Apply 1 application. topically 4 (four) times daily as needed for irritation. On abdominal fold, intertrigo, Disp: 1 each, Rfl: 1   PARoxetine (PAXIL) 30 MG tablet, TAKE 1 TABLET BY MOUTH EVERY NIGHT AT BEDTIME *DO NOT CRUSH OR CHEW* *HAZARDOUS DRUG: WEAR GLOVES*, Disp: 90 tablet, Rfl: 0   potassium chloride (KLOR-CON) 20 MEQ packet, MIX 1 PACKET AS DIRECTED, THEN TAKE BY MOUTH TWICE DAILY *TAKE WITH FOOD* *DISSOLVE IN 4-8 OUNCES OF COLD JUICE OR WATER*, Disp: 60 each, Rfl: 0   saccharomyces boulardii (FLORASTOR) 250 MG capsule, Take 1 capsule (250 mg total) by mouth daily., Disp: 30 capsule, Rfl: 0   Skin Protectants, Misc. (MINERIN CREME) CREA, APPLY 1 APPLIACTION  TOPICALLY ONCE DAILY  TO BILATERAL FEET, Disp: 396 g, Rfl: 10   zinc sulfate 220 (50 Zn) MG capsule, Take 1 capsule (220 mg total) by mouth daily., Disp: , Rfl:   No Known Allergies  I personally reviewed active problem list, medication list, allergies, family history, social history, health maintenance with the patient/caregiver today.   ROS  Ten systems reviewed and is negative except as mentioned in HPI   Objective  Vitals:   07/25/22 1058  Bruce: 126/82  Pulse: 79  Resp: 16  SpO2: 96%  Weight: 186 lb (84.4 kg)  Height:  (1.651 m)    Body mass index is 30.95 kg/m.  Physical Exam  Constitutional: Patient appears well-developed and well-nourished. Obese  No distress.  HEENT: head atraumatic,  normocephalic, pupils equal and reactive to light, neck supple Cardiovascular: Normal rate, regular rhythm and normal heart sounds.  No murmur heard. No BLE edema. Pulmonary/Chest: Effort normal and breath sounds normal. No respiratory distress. Abdominal: Soft.  There is no tenderness. Psychiatric: Patient has a normal mood and affect. Cooperative    PHQ2/9:    07/25/2022   10:50 AM 05/05/2022   11:34 AM 06/14/2021    9:11 AM 01/22/2021    8:46 AM 07/22/2020   10:11 AM  Depression screen PHQ 2/9  Decreased Interest 0 0 0 0 0  Down, Depressed, Hopeless 0 0 0 0 0  PHQ - 2 Score 0 0 0 0 0  Altered sleeping 0 0 0  Tired, decreased energy 0 0 0    Change in appetite 0 0 0    Feeling bad or failure about yourself  0 0 0    Trouble concentrating 3 0 0    Moving slowly or fidgety/restless 0 0 0    Suicidal thoughts 0 0 0    PHQ-9 Score 3 0 0      phq 9 is negative   Fall Risk:    07/25/2022   10:50 AM 05/05/2022   11:34 AM 07/23/2021   11:34 AM 06/14/2021    9:11 AM 01/22/2021    8:45 AM  Fall Risk   Falls in the past year? 0 0 0 0 1  Number falls in past yr: 0  0  0  Injury with Fall? 0  0  0  Risk for fall due to : No Fall Risks No Fall Risks   No Fall Risks  Follow up Falls prevention discussed Falls prevention discussed;Education provided  Falls prevention discussed Falls prevention discussed      Functional Status Survey: Is the patient deaf or have difficulty hearing?: No Does the patient have difficulty seeing, even when wearing glasses/contacts?: No Does the patient have difficulty concentrating, remembering, or making decisions?: Yes Does the patient have difficulty walking or climbing stairs?: Yes Does the patient have difficulty dressing or bathing?: Yes Does the patient have difficulty doing errands alone such as visiting a doctor's office or shopping?: Yes    Assessment & Plan  1. Atherosclerosis of aorta  On statin therapy   2. Hypertension, benign  -  amLODipine (NORVASC) 5 MG tablet; Take 1 tablet (5 mg total) by mouth daily.  Dispense: 90 tablet; Refill: 0 - CBC with Differential/Platelet - COMPLETE METABOLIC PANEL WITH GFR  3. Dyslipidemia  - atorvastatin (LIPITOR) 20 MG tablet; TAKE 1 TABLET BY MOUTH ONCE DAILY AT 6PM  Dispense: 90 tablet; Refill: 1 - Lipid panel  4. Vitamin D deficiency  - VITAMIN D 25 Hydroxy (Vit-D Deficiency, Fractures)  5. Hyperglycemia  - Hemoglobin A1c  6. Hypothyroidism (acquired)  - TSH  7. Mild intellectual disabilities

## 2022-07-25 ENCOUNTER — Encounter: Payer: Self-pay | Admitting: Family Medicine

## 2022-07-25 ENCOUNTER — Ambulatory Visit (INDEPENDENT_AMBULATORY_CARE_PROVIDER_SITE_OTHER): Payer: Medicare Other | Admitting: Family Medicine

## 2022-07-25 VITALS — BP 126/82 | HR 79 | Resp 16 | Ht 65.0 in | Wt 186.0 lb

## 2022-07-25 DIAGNOSIS — I10 Essential (primary) hypertension: Secondary | ICD-10-CM

## 2022-07-25 DIAGNOSIS — E785 Hyperlipidemia, unspecified: Secondary | ICD-10-CM

## 2022-07-25 DIAGNOSIS — R739 Hyperglycemia, unspecified: Secondary | ICD-10-CM | POA: Diagnosis not present

## 2022-07-25 DIAGNOSIS — E559 Vitamin D deficiency, unspecified: Secondary | ICD-10-CM | POA: Diagnosis not present

## 2022-07-25 DIAGNOSIS — I700 Atherosclerosis of aorta: Secondary | ICD-10-CM

## 2022-07-25 DIAGNOSIS — E039 Hypothyroidism, unspecified: Secondary | ICD-10-CM | POA: Diagnosis not present

## 2022-07-25 DIAGNOSIS — F70 Mild intellectual disabilities: Secondary | ICD-10-CM

## 2022-07-25 LAB — CBC WITH DIFFERENTIAL/PLATELET
Eosinophils Absolute: 83 cells/uL (ref 15–500)
Eosinophils Relative: 1.5 %
HCT: 44 % (ref 35.0–45.0)
MCV: 84.1 fL (ref 80.0–100.0)
RBC: 5.23 10*6/uL — ABNORMAL HIGH (ref 3.80–5.10)
Total Lymphocyte: 39.3 %

## 2022-07-25 LAB — TSH: TSH: 12.6 mIU/L — ABNORMAL HIGH (ref 0.40–4.50)

## 2022-07-25 LAB — VITAMIN D 25 HYDROXY (VIT D DEFICIENCY, FRACTURES): Vit D, 25-Hydroxy: 39 ng/mL (ref 30–100)

## 2022-07-25 MED ORDER — AMLODIPINE BESYLATE 5 MG PO TABS: 5.0000 mg | ORAL_TABLET | Freq: Every day | ORAL | 0 refills | Status: DC

## 2022-07-25 MED ORDER — ATORVASTATIN CALCIUM 20 MG PO TABS: ORAL_TABLET | ORAL | 1 refills | Status: DC

## 2022-07-25 NOTE — Patient Instructions (Addendum)
She needs Tdap and shingrix #2 , she needs to get it from a local pharmacy  Stop potassium supplementation Return in one to two weeks for repeat labs

## 2022-07-26 ENCOUNTER — Other Ambulatory Visit: Payer: Self-pay | Admitting: Family Medicine

## 2022-07-26 DIAGNOSIS — E039 Hypothyroidism, unspecified: Secondary | ICD-10-CM

## 2022-07-26 LAB — CBC WITH DIFFERENTIAL/PLATELET
Absolute Monocytes: 605 cells/uL (ref 200–950)
Basophils Absolute: 50 cells/uL (ref 0–200)
Basophils Relative: 0.9 %
Hemoglobin: 14.3 g/dL (ref 11.7–15.5)
Lymphs Abs: 2162 cells/uL (ref 850–3900)
MCH: 27.3 pg (ref 27.0–33.0)
MCHC: 32.5 g/dL (ref 32.0–36.0)
MPV: 10 fL (ref 7.5–12.5)
Monocytes Relative: 11 %
Neutro Abs: 2602 cells/uL (ref 1500–7800)
Neutrophils Relative %: 47.3 %
Platelets: 294 10*3/uL (ref 140–400)
RDW: 13.1 % (ref 11.0–15.0)
WBC: 5.5 10*3/uL (ref 3.8–10.8)

## 2022-07-26 LAB — LIPID PANEL
Cholesterol: 192 mg/dL (ref <200)
HDL: 75 mg/dL (ref >=50)
LDL Cholesterol (Calc): 99 mg/dL (calc)
Non-HDL Cholesterol (Calc): 117 mg/dL (calc) (ref <130)
Total CHOL/HDL Ratio: 2.6 (calc) (ref <5.0)
Triglycerides: 90 mg/dL (ref <150)

## 2022-07-26 LAB — COMPLETE METABOLIC PANEL WITH GFR
AG Ratio: 1.5 (calc) (ref 1.0–2.5)
ALT: 20 U/L (ref 6–29)
AST: 18 U/L (ref 10–35)
Albumin: 4.5 g/dL (ref 3.6–5.1)
Alkaline phosphatase (APISO): 82 U/L (ref 37–153)
BUN: 13 mg/dL (ref 7–25)
CO2: 28 mmol/L (ref 20–32)
Calcium: 9.5 mg/dL (ref 8.6–10.4)
Chloride: 104 mmol/L (ref 98–110)
Creat: 0.84 mg/dL (ref 0.50–1.05)
Globulin: 3 g/dL (calc) (ref 1.9–3.7)
Glucose, Bld: 100 mg/dL — ABNORMAL HIGH (ref 65–99)
Potassium: 4 mmol/L (ref 3.5–5.3)
Sodium: 142 mmol/L (ref 135–146)
Total Bilirubin: 0.6 mg/dL (ref 0.2–1.2)
Total Protein: 7.5 g/dL (ref 6.1–8.1)
eGFR: 80 mL/min/{1.73_m2} (ref >=60)

## 2022-07-26 LAB — HEMOGLOBIN A1C
Hgb A1c MFr Bld: 5.8 % of total Hgb — ABNORMAL HIGH (ref <5.7)
Mean Plasma Glucose: 120 mg/dL
eAG (mmol/L): 6.6 mmol/L

## 2022-07-26 MED ORDER — LEVOTHYROXINE SODIUM 112 MCG PO TABS: 112.0000 ug | ORAL_TABLET | Freq: Every day | ORAL | 1 refills | Status: DC

## 2022-08-08 NOTE — Progress Notes (Addendum)
Name: Rebecca Bruce   MRN: [id number]    DOB: [birth year]   Date:08/09/2022       Progress Note  Subjective  Chief Complaint  Follow Up  HPI   Hypothyroidism : used to see  Dr. Dario Guardian, s/p ablation for hyperthyroidism, she was doing well on Synthroid 100 mcg daily however during her last visit two weeks ago TSH was really high at 12.60. We adjust dose of levothyroxine to 112 mcg daily, she denies palpitation, diarrhea or headache. Appetite is good. . She takes in am without any other medications or food. We will continue 112 mcg and recheck level in one month.  [place] came in [name] today     Patient Active Problem List   Diagnosis Date Noted   Atherosclerosis of aorta (HCC) 07/25/2022   Excoriation (skin-picking) disorder 06/14/2021   Transaminitis    Pressure injury of skin 03/28/2019   History of COVID-19    Mild intellectual disabilities 07/13/2018   Hyperglycemia 07/13/2018   Pre-diabetes 01/05/2017   Dyslipidemia 07/02/2015   History of hyperthyroidism 07/02/2015   History of radioactive iodine thyroid ablation 07/02/2015   Hypothyroidism (acquired) 07/02/2015   Hypertension, benign 07/02/2015   Anxiety 07/02/2015   Vitamin D deficiency 07/02/2015   Insomnia 07/02/2015   OA (osteoarthritis) of knee 07/02/2015   GERD (gastroesophageal reflux disease) 07/02/2015    Past Surgical History:  Procedure Laterality Date   DENTAL SURGERY     IR ANGIOGRAM SELECTIVE EACH ADDITIONAL VESSEL  03/29/2019   IR ANGIOGRAM SELECTIVE EACH ADDITIONAL VESSEL  03/29/2019   IR ANGIOGRAM SELECTIVE EACH ADDITIONAL VESSEL  03/29/2019   IR ANGIOGRAM SELECTIVE EACH ADDITIONAL VESSEL  03/29/2019   IR ANGIOGRAM VISCERAL SELECTIVE  03/29/2019   IR ANGIOGRAM VISCERAL SELECTIVE  03/29/2019   IR US GUIDE VASC ACCESS RIGHT  03/29/2019    Family History  Problem Relation Age of Onset   Heart disease Mother    Breast cancer Neg Hx     Social History   Tobacco Use   Smoking status:  Never   Smokeless tobacco: Never   Tobacco comments:    smoking cessation materials not required  Substance Use Topics   Alcohol use: No    Alcohol/week: 0.0 standard drinks of alcohol     Current Outpatient Medications:    acetaminophen (TYLENOL) 325 MG tablet, Take 1-2 tablets (325-650 mg total) by mouth every 4 (four) hours as needed for mild pain., Disp:  , Rfl:    amLODipine (NORVASC) 5 MG tablet, Take 1 tablet (5 mg total) by mouth daily., Disp: 90 tablet, Rfl: 0   atorvastatin (LIPITOR) 20 MG tablet, TAKE 1 TABLET BY MOUTH ONCE DAILY AT 6PM, Disp: 90 tablet, Rfl: 1   Calcium-Vitamin D-Vitamin K (VIACTIV CALCIUM PLUS D) 650-12.5-40 MG-MCG-MCG CHEW, CHEW AND SWALLOW 1 PIECE BY MOUTH TWICE DAILY, Disp: 100 tablet, Rfl: 10   clobetasol ointment (TEMOVATE) 0.05 %, Apply 1 Application topically 2 (two) times daily., Disp: , Rfl:    gabapentin (NEURONTIN) 300 MG capsule, Take 1 capsule (300 mg total) by mouth at bedtime., Disp: 900 capsule, Rfl: 1   hydrOXYzine (VISTARIL) 25 MG capsule, Take 1 capsule (25 mg total) by mouth every 8 (eight) hours as needed., Disp: 30 capsule, Rfl: 3   Ipratropium-Albuterol (COMBIVENT) 20-100 MCG/ACT AERS respimat, Inhale 1 puff into the lungs every 6 (six) hours as needed for wheezing., Disp: 4 g, Rfl: 0   levothyroxine (SYNTHROID) 112 MCG tablet, Take 1 tablet (112 mcg total)  by mouth daily before breakfast. Recheck TSH in 6 weeks, Disp: 30 tablet, Rfl: 1   LIQUID C 500 MG/5ML LIQD liquid, TAKE 5 ML (500MG ) BY MOUTH ONCE DAILY, Disp: 120 mL, Rfl: 10   LORazepam (ATIVAN) 0.5 MG tablet, Take 0.5 mg by mouth daily as needed for anxiety. Can take an extra pill 30 minutes after the first dose if needed, Disp: , Rfl:    Multiple Vitamins-Minerals (THEREMS-M) TABS, TAKE 1 TABLET BY MOUTH ONCE DAILY, Disp: 90 tablet, Rfl: 0   PARoxetine (PAXIL) 30 MG tablet, TAKE 1 TABLET BY MOUTH EVERY NIGHT AT BEDTIME *DO NOT CRUSH OR CHEW* *HAZARDOUS DRUG: WEAR GLOVES*, Disp: 90  tablet, Rfl: 0   saccharomyces boulardii (FLORASTOR) 250 MG capsule, Take 1 capsule (250 mg total) by mouth daily., Disp: 30 capsule, Rfl: 0   Skin Protectants, Misc. (MINERIN CREME) CREA, APPLY 1 APPLIACTION  TOPICALLY ONCE DAILY  TO BILATERAL FEET, Disp: 396 g, Rfl: 10   zinc sulfate 220 (50 Zn) MG capsule, Take 1 capsule (220 mg total) by mouth daily., Disp: , Rfl:   No Known Allergies  I personally reviewed active problem list, medication list, allergies, family history, social history, health maintenance with the patient/caregiver today.   ROS  Ten systems reviewed and is negative except as mentioned in HPI   Objective  Vitals:   08/09/22 1118  BP: 128/78  Pulse: 79  Resp: 16  SpO2: 99%  Weight: 186 lb (84.4 kg)  Height: 5\' 5"  (1.651 m)    Body mass index is 30.95 kg/m.  Physical Exam  Constitutional: Patient appears well-developed and well-nourished. Obese  No distress.  HEENT: head atraumatic, normocephalic, pupils equal and reactive to light,, neck supple, no thyromegaly  Cardiovascular: Normal rate, regular rhythm and normal heart sounds.  No murmur heard. No BLE edema. Pulmonary/Chest: Effort normal and breath sounds normal. No respiratory distress. Abdominal: Soft.  There is no tenderness. Psychiatric: Patient has a normal mood and affect. behavior is normal. Judgment and thought content normal.    PHQ2/9:    08/09/2022   11:17 AM 07/25/2022   10:50 AM 05/05/2022   11:34 AM 06/14/2021    9:11 AM 01/22/2021    8:46 AM  Depression screen PHQ 2/9  Decreased Interest 0 0 0 0 0  Down, Depressed, Hopeless 0 0 0 0 0  PHQ - 2 Score 0 0 0 0 0  Altered sleeping  0 0 0   Tired, decreased energy  0 0 0   Change in appetite  0 0 0   Feeling bad or failure about yourself   0 0 0   Trouble concentrating  3 0 0   Moving slowly or fidgety/restless  0 0 0   Suicidal thoughts  0 0 0   PHQ-9 Score  3 0 0     phq 9 is negative   Fall Risk:    08/09/2022   11:17 AM  07/25/2022   10:50 AM 05/05/2022   11:34 AM 07/23/2021   11:34 AM 06/14/2021    9:11 AM  Fall Risk   Falls in the past year? 0 0 0 0 0  Number falls in past yr: 0 0  0   Injury with Fall? 0 0  0   Risk for fall due to : No Fall Risks No Fall Risks No Fall Risks    Follow up Falls prevention discussed Falls prevention discussed Falls prevention discussed;Education provided  Falls prevention discussed  Functional Status Survey: Is the patient deaf or have difficulty hearing?: No Does the patient have difficulty seeing, even when wearing glasses/contacts?: No Does the patient have difficulty concentrating, remembering, or making decisions?: Yes Does the patient have difficulty walking or climbing stairs?: Yes Does the patient have difficulty dressing or bathing?: Yes Does the patient have difficulty doing errands alone such as visiting a doctor's office or shopping?: Yes    Assessment & Plan  1. Hypothyroidism (acquired)  Continue 112 mcg and recheck TSH in one month

## 2022-08-09 ENCOUNTER — Encounter: Payer: Self-pay | Admitting: Family Medicine

## 2022-08-09 ENCOUNTER — Ambulatory Visit (INDEPENDENT_AMBULATORY_CARE_PROVIDER_SITE_OTHER): Payer: Medicare Other | Admitting: Family Medicine

## 2022-08-09 VITALS — BP 128/78 | HR 79 | Resp 16 | Ht 65.0 in | Wt 186.0 lb

## 2022-08-09 DIAGNOSIS — E039 Hypothyroidism, unspecified: Secondary | ICD-10-CM

## 2022-08-15 ENCOUNTER — Ambulatory Visit: Payer: Self-pay

## 2022-08-15 NOTE — Telephone Encounter (Signed)
Pts caregiver will call back to schedule an appt

## 2022-08-15 NOTE — Telephone Encounter (Signed)
[name] with [facility]  reports that patient tested positive for Covid and she would like to have a prescription sent to the pharmacy. Cb# [phone]   Chief Complaint: Head congestion. Caregiver asking for Paxlovid "because she had COVID before and was real sick." Please advise. Symptoms: Above Frequency: Today Pertinent Negatives: Patient denies fever Disposition: [] ED /[] Urgent Care (no appt availability in office) / [] Appointment(In office/virtual)/ []  [facility]/ [] Home Care/ [] Refused Recommended Disposition /[] [facility]/ [x]  Follow-up with PCP Additional Notes:   Answer Assessment - Initial Assessment Questions 1. COVID-19 DIAGNOSIS: "How do you know that you have COVID?" (e.g., positive lab test or self-test, diagnosed by doctor or NP/PA, symptoms after exposure).     Test 2. COVID-19 EXPOSURE: "Was there any known exposure to COVID before the symptoms began?" CDC Definition of close contact: within 6 feet (2 meters) for a total of 15 minutes or more over a 24-hour period.      No 3. ONSET: "When did the COVID-19 symptoms start?"      Today 4. WORST SYMPTOM: "What is your worst symptom?" (e.g., cough, fever, shortness of breath, muscle aches)     Head congestion 5. COUGH: "Do you have a cough?" If Yes, ask: "How bad is the cough?"       No 6. FEVER: "Do you have a fever?" If Yes, ask: "What is your temperature, how was it measured, and when did it start?"     No 7. RESPIRATORY STATUS: "Describe your breathing?" (e.g., normal; shortness of breath, wheezing, unable to speak)      No problems 8. BETTER-SAME-WORSE: "Are you getting better, staying the same or getting worse compared to yesterday?"  If getting worse, ask, "In what way?"     Same 9. OTHER SYMPTOMS: "Do you have any other symptoms?"  (e.g., chills, fatigue, headache, loss of smell or taste, muscle pain, sore throat)     No 10. HIGH RISK DISEASE: "Do you have any chronic medical problems?"  (e.g., asthma, heart or lung disease, weak immune system, obesity, etc.)       No 11. VACCINE: "Have you had the COVID-19 vaccine?" If Yes, ask: "Which one, how many shots, when did you get it?"       N/a 12. PREGNANCY: "Is there any chance you are pregnant?" "When was your last menstrual period?"       No 13. O2 SATURATION MONITOR:  "Do you use an oxygen saturation monitor (pulse oximeter) at home?" If Yes, ask "What is your reading (oxygen level) today?" "What is your usual oxygen saturation reading?" (e.g., 95%)       No  Protocols used: Coronavirus (COVID-19) Diagnosed or Suspected-A-AH

## 2022-08-16 NOTE — Telephone Encounter (Signed)
Caregiver ([name]) will call back to sch appt

## 2022-09-27 ENCOUNTER — Other Ambulatory Visit: Payer: Self-pay | Admitting: Family Medicine

## 2022-09-27 DIAGNOSIS — E039 Hypothyroidism, unspecified: Secondary | ICD-10-CM

## 2022-10-04 ENCOUNTER — Other Ambulatory Visit: Payer: Self-pay | Admitting: Family Medicine

## 2022-10-04 DIAGNOSIS — E039 Hypothyroidism, unspecified: Secondary | ICD-10-CM

## 2022-10-11 ENCOUNTER — Other Ambulatory Visit: Payer: Self-pay | Admitting: Family Medicine

## 2022-10-11 DIAGNOSIS — E039 Hypothyroidism, unspecified: Secondary | ICD-10-CM

## 2022-10-11 NOTE — Telephone Encounter (Signed)
Pt has not came to do recheck on lab

## 2022-10-25 ENCOUNTER — Other Ambulatory Visit: Payer: Self-pay | Admitting: Family Medicine

## 2022-10-25 DIAGNOSIS — E039 Hypothyroidism, unspecified: Secondary | ICD-10-CM

## 2022-10-25 DIAGNOSIS — I10 Essential (primary) hypertension: Secondary | ICD-10-CM

## 2022-10-26 ENCOUNTER — Other Ambulatory Visit: Payer: Self-pay

## 2022-10-26 DIAGNOSIS — I10 Essential (primary) hypertension: Secondary | ICD-10-CM

## 2022-10-26 DIAGNOSIS — E039 Hypothyroidism, unspecified: Secondary | ICD-10-CM

## 2022-10-26 MED ORDER — AMLODIPINE BESYLATE 5 MG PO TABS: 5.0000 mg | ORAL_TABLET | Freq: Every day | ORAL | 0 refills | Status: DC

## 2022-11-01 ENCOUNTER — Other Ambulatory Visit: Payer: Self-pay | Admitting: Family Medicine

## 2022-11-01 DIAGNOSIS — E039 Hypothyroidism, unspecified: Secondary | ICD-10-CM

## 2022-12-07 DIAGNOSIS — L280 Lichen simplex chronicus: Secondary | ICD-10-CM | POA: Diagnosis not present

## 2023-01-10 ENCOUNTER — Other Ambulatory Visit: Payer: Self-pay | Admitting: Family Medicine

## 2023-01-10 DIAGNOSIS — E039 Hypothyroidism, unspecified: Secondary | ICD-10-CM

## 2023-01-17 ENCOUNTER — Other Ambulatory Visit: Payer: Self-pay | Admitting: Family Medicine

## 2023-01-17 ENCOUNTER — Other Ambulatory Visit: Payer: Self-pay | Admitting: Internal Medicine

## 2023-01-17 DIAGNOSIS — F419 Anxiety disorder, unspecified: Secondary | ICD-10-CM

## 2023-01-17 DIAGNOSIS — E785 Hyperlipidemia, unspecified: Secondary | ICD-10-CM

## 2023-01-17 DIAGNOSIS — F424 Excoriation (skin-picking) disorder: Secondary | ICD-10-CM

## 2023-01-17 DIAGNOSIS — F72 Severe intellectual disabilities: Secondary | ICD-10-CM

## 2023-01-18 ENCOUNTER — Other Ambulatory Visit: Payer: Self-pay

## 2023-01-18 DIAGNOSIS — F424 Excoriation (skin-picking) disorder: Secondary | ICD-10-CM

## 2023-01-18 NOTE — Telephone Encounter (Signed)
Requested Prescriptions  Pending Prescriptions Disp Refills   PARoxetine (PAXIL) 30 MG tablet [Pharmacy Med Name: PARoxetine HCl 30 MG Tablet] 7 tablet 10    Sig: TAKE 1 TABLET BY MOUTH EVERY NIGHT AT BEDTIME *DO NOT CRUSH OR CHEW* *HAZARDOUS DRUG: WEAR GLOVES*     Psychiatry:  Antidepressants - SSRI Passed - 01/17/2023  6:33 PM      Passed - Valid encounter within last 6 months    Recent Outpatient Visits           5 months ago Hypothyroidism (acquired)   [facility] Alba Cory, MD   5 months ago [facility]rosclerosis of aorta [facility]   [facility] Alba Cory, MD   8 months ago Superficial burn of chest wall, initial encounter   [facility] Alba Cory, MD   1 year ago Hypertension, benign   [facility] Margarita Mail, DO   1 year ago Excoriation (skin-picking) disorder   [facility] Mecum, Oswaldo Conroy, PA-C       Future Appointments             In 6 days Alba Cory, MD [facility]             Multiple Vitamins-Minerals (THERA-TABS M) TABS [Pharmacy Med Name: Wayne Sever Tablet] 7 tablet 10    Sig: TAKE 1 TABLET BY MOUTH ONCE DAILY     There is no refill protocol information for this order

## 2023-01-18 NOTE — Telephone Encounter (Signed)
Requested medication (s) are due for refill today: Yes  Requested medication (s) are on the active medication list: Yes  Last refill:  07/20/22  Future visit scheduled: Yes  Notes to clinic:  Unable to refill per protocol, medication not assigned to the refill protocol.      Requested Prescriptions  Pending Prescriptions Disp Refills   Multiple Vitamins-Minerals (THERA-TABS M) TABS [Pharmacy Med Name: Wayne Sever Tablet] 7 tablet 10    Sig: TAKE 1 TABLET BY MOUTH ONCE DAILY     There is no refill protocol information for this order    Signed Prescriptions Disp Refills   PARoxetine (PAXIL) 30 MG tablet 90 tablet 0    Sig: TAKE 1 TABLET BY MOUTH EVERY NIGHT AT BEDTIME *DO NOT CRUSH OR CHEW* *HAZARDOUS DRUG: WEAR GLOVES*     Psychiatry:  Antidepressants - SSRI Passed - 01/17/2023  6:33 PM      Passed - Valid encounter within last 6 months    Recent Outpatient Visits           5 months ago Hypothyroidism (acquired)   [facility] Alba Cory, MD   5 months ago Atherosclerosis of aorta [facility])   [facility] Alba Cory, MD   8 months ago Superficial burn of chest wall, initial encounter   [facility] Alba Cory, MD   1 year ago Hypertension, benign   [facility] Margarita Mail, DO   1 year ago Excoriation (skin-picking) disorder   [facility] Mecum, Oswaldo Conroy, PA-C       Future Appointments             In 6 days Alba Cory, MD [facility]

## 2023-01-23 NOTE — Progress Notes (Unsigned)
Name: Rebecca Bruce   MRN: [id number]    DOB: Jan 30, 1961   Date:01/24/2023       Progress Note  Subjective  Chief Complaint  Follow Up  HPI  HTN: taking medications, no side effects, denies chest pain or palpitation. She came in today with [facility].  She lives in a group home, [facility] . BP is at goal    Hyperlipidemia: taking medication as prescribed and no problems, denies myalgias. Last LDL was 99    Hypothyroidism : used to see  Dr. Dario Bruce, s/p ablation for hyperthyroidism, currently on 112 mcg daily, denies dysphagia, no change in bowel movements   MR: stable, sees psychiatrist, Dr. Ave Bruce  for her behavior, she lives in a group home She needs assistance bathing and dressing, managing her money. 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   Hyperglycemia 07/13/2018   Pre-diabetes 01/05/2017   Dyslipidemia 07/02/2015   History of hyperthyroidism 07/02/2015   History of radioactive iodine thyroid ablation 07/02/2015   Hypothyroidism (acquired) 07/02/2015   Hypertension, benign 07/02/2015   Anxiety 07/02/2015   Vitamin D deficiency 07/02/2015   Insomnia 07/02/2015   OA  (osteoarthritis) of knee 07/02/2015   GERD (gastroesophageal reflux disease) 07/02/2015    Past Surgical History:  Procedure Laterality Date   DENTAL SURGERY     IR ANGIOGRAM SELECTIVE EACH ADDITIONAL VESSEL  03/29/2019   IR ANGIOGRAM SELECTIVE EACH ADDITIONAL VESSEL  03/29/2019   IR ANGIOGRAM SELECTIVE EACH ADDITIONAL VESSEL  03/29/2019   IR ANGIOGRAM SELECTIVE EACH ADDITIONAL VESSEL  03/29/2019   IR ANGIOGRAM VISCERAL SELECTIVE  03/29/2019   IR ANGIOGRAM VISCERAL SELECTIVE  03/29/2019   IR US GUIDE VASC ACCESS RIGHT  03/29/2019    Family History  Problem Relation Age of Onset   Heart disease Mother    Breast cancer Neg Hx     Social History   Tobacco Use   Smoking status: Never   Smokeless tobacco: Never   Tobacco comments:    smoking cessation materials not required  Substance Use Topics   Alcohol use: No    Alcohol/week: 0.0 standard drinks of alcohol     Current Outpatient Medications:    acetaminophen (TYLENOL) 325 MG tablet, Take 1-2 tablets (325-650 mg total) by mouth every 4 (four) hours as needed for mild pain., Disp:  , Rfl:    amLODipine (NORVASC) 5 MG tablet, Take 1 tablet (5 mg total) by mouth daily., Disp: 90 tablet, Rfl: 0   atorvastatin (LIPITOR) 20 MG tablet, TAKE 1 TABLET BY MOUTH ONCE DAILY AT 6PM, Disp: 7 tablet, Rfl: 10  Calcium-Vitamin D-Vitamin K (VIACTIV CALCIUM PLUS D) 650-12.5-40 MG-MCG-MCG CHEW, CHEW AND SWALLOW 1 PIECE BY MOUTH TWICE DAILY, Disp: 100 tablet, Rfl: 10   clobetasol ointment (TEMOVATE) 0.05 %, Apply 1 Application topically 2 (two) times daily., Disp: , Rfl:    gabapentin (NEURONTIN) 300 MG capsule, Take 1 capsule (300 mg total) by mouth at bedtime., Disp: 900 capsule, Rfl: 1   hydrOXYzine (VISTARIL) 25 MG capsule, Take 1 capsule (25 mg total) by mouth every 8 (eight) hours as needed., Disp: 30 capsule, Rfl: 3   Ipratropium-Albuterol (COMBIVENT) 20-100 MCG/ACT AERS respimat, Inhale 1 puff into the lungs every 6 (six) hours as needed  for wheezing., Disp: 4 g, Rfl: 0   levothyroxine (SYNTHROID) 112 MCG tablet, @@TK  1 TABLET BY MOUTH ONCE DAILY BEFORE BREAKFAST, Disp: 30 tablet, Rfl: 0   LIQUID C 500 MG/5ML LIQD liquid, TAKE 5 ML (500MG ) BY MOUTH ONCE DAILY, Disp: 120 mL, Rfl: 10   LORazepam (ATIVAN) 0.5 MG tablet, Take 0.5 mg by mouth daily as needed for anxiety. Can take an extra pill 30 minutes after the first dose if needed, Disp: , Rfl:    Multiple Vitamins-Minerals (THERA-TABS M) TABS, TAKE 1 TABLET BY MOUTH ONCE DAILY, Disp: 7 tablet, Rfl: 10   PARoxetine (PAXIL) 30 MG tablet, TAKE 1 TABLET BY MOUTH EVERY NIGHT AT BEDTIME *DO NOT CRUSH OR CHEW* *HAZARDOUS DRUG: WEAR GLOVES*, Disp: 90 tablet, Rfl: 0   saccharomyces boulardii (FLORASTOR) 250 MG capsule, Take 1 capsule (250 mg total) by mouth daily., Disp: 30 capsule, Rfl: 0   Skin Protectants, Misc. (MINERIN CREME) CREA, APPLY 1 APPLIACTION  TOPICALLY ONCE DAILY  TO BILATERAL FEET, Disp: 396 g, Rfl: 10   zinc sulfate 220 (50 Zn) MG capsule, Take 1 capsule (220 mg total) by mouth daily., Disp: , Rfl:   No Known Allergies  I personally reviewed active problem list, medication list, allergies, family history, social history, health maintenance with the patient/caregiver today.   ROS  Ten systems reviewed and is negative except as mentioned in HPI    Objective  Vitals:   01/24/23 0945  BP: 122/78  Pulse: 84  Resp: 18  Temp: 97.8 F (36.6 C)  TempSrc: Oral  SpO2: 99%  Weight: 188 lb 1.6 oz (85.3 kg)  Height: 5\' 5"  (1.651 m)    Body mass index is 31.3 kg/m.  Physical Exam  Constitutional: Patient appears well-developed and well-nourished. No distress.  HENT: Head: Normocephalic and atraumatic. Ears: B TMs ok, no erythema or effusion; Nose: Nose normal. Mouth/Throat: Oropharynx is clear and moist. No oropharyngeal exudate.  Eyes: Conjunctivae and EOM are normal. Pupils are equal, round, and reactive to light. No scleral icterus.  Neck: Normal range of  motion. Neck supple. No JVD present. No thyromegaly present.  Cardiovascular: Normal rate, regular rhythm and normal heart sounds.  No murmur heard. No BLE edema. Pulmonary/Chest: Effort normal and breath sounds normal. No respiratory distress. Abdominal: Soft. Bowel sounds are normal, no distension. There is no tenderness. no masses FEMALE GENITALIA:  External genitalia normal, not able to introduce speculum due to patient getting nervous and scared  Psychiatric: Patient has a normal mood and affect.   PHQ2/9:    08/09/2022   11:17 AM 07/25/2022   10:50 AM 05/05/2022   11:34 AM 06/14/2021    9:11 AM 01/22/2021    8:46 AM  Depression screen PHQ 2/9  Decreased Interest 0 0 0 0 0  Down, Depressed, Hopeless 0 0 0 0 0  PHQ - 2 Score 0 0 0 0 0  Altered sleeping  0 0 0   Tired, decreased energy  0 0 0   Change in appetite  0 0 0   Feeling bad or failure about yourself   0 0 0   Trouble concentrating  3 0 0   Moving slowly or fidgety/restless  0 0 0   Suicidal thoughts  0 0 0   PHQ-9 Score  3 0 0     phq 9 is negative   Fall Risk:    01/24/2023    9:46 AM 08/09/2022   11:17 AM 07/25/2022   10:50 AM 05/05/2022   11:34 AM 07/23/2021   11:34 AM  Fall Risk   Falls in the past year? 0 0 0 0 0  Number falls in past yr:  0 0  0  Injury with Fall?  0 0  0  Risk for fall due to : No Fall Risks No Fall Risks No Fall Risks No Fall Risks   Follow up Falls prevention discussed Falls prevention discussed Falls prevention discussed Falls prevention discussed;Education provided       Functional Status Survey: Is the patient deaf or have difficulty hearing?: No Does the patient have difficulty seeing, even when wearing glasses/contacts?: No Does the patient have difficulty concentrating, remembering, or making decisions?: Yes Does the patient have difficulty walking or climbing stairs?: Yes Does the patient have difficulty dressing or bathing?: No Does the patient have difficulty doing errands  alone such as visiting a doctor's office or shopping?: Yes    Assessment & Plan  1. Atherosclerosis of aorta (HCC)  Continue medications   2. Vitamin D deficiency  Taking supplementation  3. Hypothyroidism (acquired)  - TSH  4. Need for immunization against influenza  - Flu vaccine trivalent PF, 6mos and older(Flulaval,Afluria,Fluarix,Fluzone)  5. Hypertension, benign  At goal   6. Dyslipidemia   7. Mild intellectual disabilities  stable

## 2023-01-24 ENCOUNTER — Other Ambulatory Visit: Payer: Self-pay | Admitting: Family Medicine

## 2023-01-24 ENCOUNTER — Encounter: Payer: Self-pay | Admitting: Family Medicine

## 2023-01-24 ENCOUNTER — Ambulatory Visit (INDEPENDENT_AMBULATORY_CARE_PROVIDER_SITE_OTHER): Payer: Medicare Other | Admitting: Family Medicine

## 2023-01-24 ENCOUNTER — Other Ambulatory Visit: Payer: Self-pay | Admitting: Internal Medicine

## 2023-01-24 VITALS — BP 122/78 | HR 84 | Temp 97.8°F | Resp 18 | Ht 65.0 in | Wt 188.1 lb

## 2023-01-24 DIAGNOSIS — E039 Hypothyroidism, unspecified: Secondary | ICD-10-CM | POA: Diagnosis not present

## 2023-01-24 DIAGNOSIS — I700 Atherosclerosis of aorta: Secondary | ICD-10-CM

## 2023-01-24 DIAGNOSIS — F70 Mild intellectual disabilities: Secondary | ICD-10-CM | POA: Diagnosis not present

## 2023-01-24 DIAGNOSIS — Z23 Encounter for immunization: Secondary | ICD-10-CM | POA: Diagnosis not present

## 2023-01-24 DIAGNOSIS — E559 Vitamin D deficiency, unspecified: Secondary | ICD-10-CM

## 2023-01-24 DIAGNOSIS — E785 Hyperlipidemia, unspecified: Secondary | ICD-10-CM | POA: Diagnosis not present

## 2023-01-24 DIAGNOSIS — I10 Essential (primary) hypertension: Secondary | ICD-10-CM

## 2023-01-24 DIAGNOSIS — F419 Anxiety disorder, unspecified: Secondary | ICD-10-CM

## 2023-01-24 DIAGNOSIS — F72 Severe intellectual disabilities: Secondary | ICD-10-CM

## 2023-01-25 ENCOUNTER — Ambulatory Visit: Payer: Self-pay

## 2023-01-25 ENCOUNTER — Other Ambulatory Visit: Payer: Self-pay

## 2023-01-25 ENCOUNTER — Other Ambulatory Visit: Payer: Self-pay | Admitting: Family Medicine

## 2023-01-25 DIAGNOSIS — E039 Hypothyroidism, unspecified: Secondary | ICD-10-CM

## 2023-01-25 LAB — TSH: TSH: 21.94 m[IU]/L — ABNORMAL HIGH (ref 0.40–4.50)

## 2023-01-25 MED ORDER — LEVOTHYROXINE SODIUM 125 MCG PO TABS
125.0000 ug | ORAL_TABLET | Freq: Every day | ORAL | 1 refills | Status: DC
Start: 2023-01-25 — End: 2023-03-28

## 2023-01-25 NOTE — Telephone Encounter (Signed)
Request too soon, duplicate request.E-Prescribing Status: Receipt confirmed by pharmacy (01/18/2023  9:11 AM EDT).  Requested Prescriptions  Pending Prescriptions Disp Refills   PARoxetine (PAXIL) 30 MG tablet [Pharmacy Med Name: PARoxetine HCl 30 MG Tablet] 7 tablet 10    Sig: TAKE 1 TABLET BY MOUTH EVERY NIGHT AT BEDTIME *DO NOT CRUSH OR CHEW* *HAZARDOUS DRUG: WEAR GLOVES*     Psychiatry:  Antidepressants - SSRI Passed - 01/24/2023 12:17 PM      Passed - Valid encounter within last 6 months    Recent Outpatient Visits           Yesterday Atherosclerosis of aorta [facility])   [place] [facility] Alba Cory, MD   5 months ago Hypothyroidism (acquired)   [place] [facility] Alba Cory, MD   6 months ago Atherosclerosis of aorta [facility])   [place] [facility] Alba Cory, MD   8 months ago Superficial burn of chest wall, initial encounter   [facility], MD   1 year ago Hypertension, benign   [facility] Margarita Mail, DO       Future Appointments             In 5 months Carlynn Purl, Danna Hefty, MD [facility], [facility]

## 2023-01-25 NOTE — Telephone Encounter (Signed)
  Chief Complaint: medication question Symptoms:  Frequency:  Pertinent Negatives:  Disposition: [] ED /[] Urgent Care (no appt availability in office) / [] Appointment(In office/virtual)/ []  [place] [facility]/ [] Home Care/ [] Refused Recommended Disposition /[] [place] Mobile Bus/ []  Follow-up with PCP Additional Notes: spoke with Larita Fife, [facility] with [facility], verified dose of Levothyroxine was sent in d/t abnomal levels. She was just confirming since dose wasn't dc'd. No further assistance noted.   Summary: med ?   Larita Fife from [facility], called in states needs more clarification for use of Levothyroxine, . She says this isnt the mg  pt was previously on, so wants to clarify. Previous mcg showed 112.         Reason for Disposition  Pharmacy calling with prescription question and triager answers question  Answer Assessment - Initial Assessment Questions 1. NAME of MEDICINE: "What medicine(s) are you calling about?"     levothyroxine 2. QUESTION: "What is your question?" (e.g., double dose of medicine, side effect)     Wanting to verify dosage of since pt has been on 3. PRESCRIBER: "Who prescribed the medicine?" Reason: if prescribed by specialist, call should be referred to that group.     Dr. Carlynn Purl  Protocols used: Medication Question Call-A-AH

## 2023-01-31 ENCOUNTER — Other Ambulatory Visit: Payer: Self-pay | Admitting: Internal Medicine

## 2023-01-31 DIAGNOSIS — F72 Severe intellectual disabilities: Secondary | ICD-10-CM

## 2023-01-31 DIAGNOSIS — F419 Anxiety disorder, unspecified: Secondary | ICD-10-CM

## 2023-02-02 NOTE — Telephone Encounter (Signed)
Too soon 01/18/23 #90  Requested Prescriptions  Refused Prescriptions Disp Refills   PARoxetine (PAXIL) 30 MG tablet [Pharmacy Med Name: PARoxetine HCl 30 MG Tablet] 7 tablet 10    Sig: TAKE 1 TABLET BY MOUTH EVERY NIGHT AT BEDTIME *DO NOT CRUSH OR CHEW* *HAZARDOUS DRUG: WEAR GLOVES*     Psychiatry:  Antidepressants - SSRI Passed - 01/31/2023  6:44 PM      Passed - Valid encounter within last 6 months    Recent Outpatient Visits           1 week ago Atherosclerosis of aorta [facility])   [place] [facility] Alba Cory, MD   5 months ago Hypothyroidism (acquired)   [place] [facility] Alba Cory, MD   6 months ago Atherosclerosis of aorta [facility])    [facility], MD   9 months ago Superficial burn of chest wall, initial encounter   [facility] Alba Cory, MD   1 year ago Hypertension, benign   [facility] Margarita Mail, DO       Future Appointments             In 5 months Carlynn Purl, Danna Hefty, MD [facility], [facility]

## 2023-02-07 ENCOUNTER — Other Ambulatory Visit: Payer: Self-pay | Admitting: Internal Medicine

## 2023-02-07 DIAGNOSIS — F72 Severe intellectual disabilities: Secondary | ICD-10-CM

## 2023-02-07 DIAGNOSIS — F419 Anxiety disorder, unspecified: Secondary | ICD-10-CM

## 2023-02-08 NOTE — Telephone Encounter (Signed)
Refused Paxel because it's being requested too soon.  On 01/18/2023 #90, 0 refills was given.  Requested Prescriptions  Pending Prescriptions Disp Refills   PARoxetine (PAXIL) 30 MG tablet [Pharmacy Med Name: PARoxetine HCl 30 MG Tablet] 7 tablet 10    Sig: TAKE 1 TABLET BY MOUTH EVERY NIGHT AT BEDTIME *DO NOT CRUSH OR CHEW* *HAZARDOUS DRUG: WEAR GLOVES*     Psychiatry:  Antidepressants - SSRI Passed - 02/07/2023  5:28 PM      Passed - Valid encounter within last 6 months    Recent Outpatient Visits           2 weeks ago Atherosclerosis of [facility])   [place] [facility], MD   6 months ago Hypothyroidism (acquired)   [place] [facility] Alba Cory, MD   6 months ago Atherosclerosis of aorta [facility])   [place] [facility] Al[facility] Cory, MD   9 months ago Superficial burn of chest wall, initial encounter   [facility], MD   1 year ago Hypertension, benign   [facility] Margarita Mail, DO       Future Appointments             In 5 months Carlynn Purl, Danna Hefty, MD [facility], [facility]

## 2023-02-14 ENCOUNTER — Other Ambulatory Visit: Payer: Self-pay | Admitting: Internal Medicine

## 2023-02-14 DIAGNOSIS — F419 Anxiety disorder, unspecified: Secondary | ICD-10-CM

## 2023-02-14 DIAGNOSIS — F72 Severe intellectual disabilities: Secondary | ICD-10-CM

## 2023-02-14 DIAGNOSIS — F339 Major depressive disorder, recurrent, unspecified: Secondary | ICD-10-CM | POA: Diagnosis not present

## 2023-02-15 NOTE — Telephone Encounter (Signed)
Already ordered 01/18/23 #90 too soon to be refilled  Requested Prescriptions  Refused Prescriptions Disp Refills   PARoxetine (PAXIL) 30 MG tablet [Pharmacy Med Name: PARoxetine HCl 30 MG Tablet] 7 tablet 10    Sig: TAKE 1 TABLET BY MOUTH EVERY NIGHT AT BEDTIME *DO NOT CRUSH OR CHEW* *HAZARDOUS DRUG: WEAR GLOVES*     Psychiatry:  Antidepressants - SSRI Passed - 02/14/2023  1:25 PM      Passed - Valid encounter within last 6 months    Recent Outpatient Visits           3 weeks ago Atherosclerosis of [facility])   [place] [facility] Alba Cory, MD   6 months ago Hypothyroidism (acquired)   [place] [facility] Alba Cory, MD   6 months ago Atherosclerosis of aorta [facility])   [place] [facility] Alba Cory, MD   9 months ago Superficial burn of chest wall, initial encounter   [facility] Cory, MD   1 year ago Hypertension, benign   [facility] Margarita Mail, DO       Future Appointments             In 5 months Carlynn Purl, Danna Hefty, MD [facility], [facility]

## 2023-02-24 ENCOUNTER — Ambulatory Visit: Payer: Medicare Other | Admitting: Nurse Practitioner

## 2023-02-24 ENCOUNTER — Ambulatory Visit: Payer: Self-pay

## 2023-02-24 NOTE — Telephone Encounter (Signed)
  Chief Complaint: Vaginal irritation Symptoms: above Frequency: last week Pertinent Negatives: Patient denies  Disposition: [] ED /[] Urgent Care (no appt availability in office) / [x] Appointment(In office/virtual)/ []  [place] Virtual Care/ [] Home Care/ [] Refused Recommended Disposition /[] [place] Mobile Bus/ []  Follow-up with PCP Additional Notes: Returned call to [employer], staff at group home. Pt is having vaginal irritation. Unclear if irritation began before or after pt prayed her vaginal area with Chalmers Cater.   Summary: Vaginal Burning   [place]ia clegg calling from [employer] is calling to report that the patient is at a group home reporting vaginal burning. No available appts. Reporting that the patient sprayed Febreeze in her vaginal area for relief. Please advise     Reason for Disposition  All other vaginal symptoms  (Exception: Feels like prior yeast infection, minor abrasion, mild rash < 24 hour duration, mild itching.)  Answer Assessment - Initial Assessment Questions 1. SYMPTOM: "What's the main symptom you're concerned about?" (e.g., pain, itching, dryness)     Pain - discomfort 2. LOCATION: "Where is the  irritation located?" (e.g., inside/outside, left/right)     Vaginal area 3. ONSET: "When did the  irritation  start?"     Last week 4. PAIN: "Is there any pain?" If Yes, ask: "How bad is it?" (Scale: 1-10; mild, moderate, severe)   -  MILD (1-3): Doesn't interfere with normal activities.    -  MODERATE (4-7): Interferes with normal activities (e.g., work or school) or awakens from sleep.     -  SEVERE (8-10): Excruciating pain, unable to do any normal activities.     unsure 5. ITCHING: "Is there any itching?" If Yes, ask: "How bad is it?" (Scale: 1-10; mild, moderate, severe)     unknown 6. CAUSE: "What do you think is causing the discharge?" "Have you had the same problem before? What happened then?"     Unsure  if irritation started before or  after [facility].  Protocols used: Vaginal Symptoms-A-AH

## 2023-02-24 NOTE — Progress Notes (Deleted)
   There were no vitals taken for this visit.   Subjective:    Patient ID: Rebecca Bruce, female    DOB: [birth year], 62 y.o.   MRN: [id number]  HPI: Rebecca Bruce is a 62 y.o. female  No chief complaint on file.  VAGINAL DISCHARGE Duration: {Blank single:19197::"days","weeks","months"} Discharge description: {Blank single:19197::"white","cottage cheese","clear","yellow","mucous"}  Pruritus: {Blank single:19197::"yes","no"} Dysuria: {Blank single:19197::"yes","no"} Malodorous: {Blank single:19197::"yes","no","worse after sex"} Urinary frequency: {Blank single:19197::"yes","no"} Fevers: {Blank single:19197::"yes","no"} Abdominal pain: {Blank single:19197::"yes","no"}  Sexual activity: {Blank single:19197::"not sexually active","monogamous","practicing safe sex","concerned about STIs"} History of sexually transmitted diseases: {Blank single:19197::"yes","no"} Recent antibiotic use: {Blank single:19197::"yes","no"} Context: {Blank multiple:19196::"better","worse","previous yeast infections","recurrent yeast infections","recurrent BV"}  Treatments attempted: {Blank multiple:19196::"none","antifungal","vagisil"}   Relevant past medical, surgical, family and social history reviewed and updated as indicated. Interim medical history since our last visit reviewed. Allergies and medications reviewed and updated.  Review of Systems  Ten systems reviewed and is negative except as mentioned in HPI       Objective:    There were no vitals taken for this visit.  Wt Readings from Last 3 Encounters:  01/24/23 188 lb 1.6 oz (85.3 kg)  08/09/22 186 lb (84.4 kg)  07/25/22 186 lb (84.4 kg)    Physical Exam  Constitutional: Patient appears well-developed and well-nourished. Obese *** No distress.  HEENT: head atraumatic, normocephalic, pupils equal and reactive to light, ears ***, neck supple, throat within normal limits Cardiovascular: Normal rate, regular rhythm and normal  heart sounds.  No murmur heard. No BLE edema. Pulmonary/Chest: Effort normal and breath sounds normal. No respiratory distress. Abdominal: Soft.  There is no tenderness. Psychiatric: Patient has a normal mood and affect. behavior is normal. Judgment and thought content normal.  Results for orders placed or performed in visit on 01/24/23  TSH  Result Value Ref Range   TSH 21.94 (H) 0.40 - 4.50 mIU/L      Assessment & Plan:   Problem List Items Addressed This Visit   None    Follow up plan: No follow-ups on file.

## 2023-03-13 ENCOUNTER — Other Ambulatory Visit: Payer: Self-pay | Admitting: Family Medicine

## 2023-03-13 ENCOUNTER — Other Ambulatory Visit: Payer: Self-pay | Admitting: Internal Medicine

## 2023-03-13 DIAGNOSIS — Z1231 Encounter for screening mammogram for malignant neoplasm of breast: Secondary | ICD-10-CM

## 2023-03-13 DIAGNOSIS — E559 Vitamin D deficiency, unspecified: Secondary | ICD-10-CM

## 2023-03-16 NOTE — Telephone Encounter (Signed)
Requested Prescriptions  Pending Prescriptions Disp Refills   Calcium-Vitamin D-Vitamin K (VIACTIV CALCIUM PLUS D) 650-12.5-40 MG-MCG-MCG CHEW [Pharmacy Med Name: Kenna Gilbert Calcium Plus D 650-12.5-40 MG-MCG Tablet chewable] 100 tablet 11    Sig: CHEW & SWALLOW 1 PIECE BY MOUTH TWICE DAILY     Endocrinology:  Vitamins 2 Passed - 03/13/2023  1:50 PM      Passed - Ca in normal range and within 360 days    Calcium  Date Value Ref Range Status  07/25/2022 9.5 8.6 - 10.4 mg/dL Final   Calcium, Ion  Date Value Ref Range Status  03/24/2019 1.20 1.15 - 1.40 mmol/L Final         Passed - Vitamin D in normal range and within 360 days    Vit D, 25-Hydroxy  Date Value Ref Range Status  07/25/2022 39 30 - 100 ng/mL Final    Comment:    Vitamin D Status         25-OH Vitamin D: . Deficiency:                    <20 ng/mL Insufficiency:             20 - 29 ng/mL Optimal:                 > or = 30 ng/mL . For 25-OH Vitamin D testing on patients on  D2-supplementation and patients for whom quantitation  of D2 and D3 fractions is required, the [employer]ure[facility](TM) 25-OH VIT D, (D2,D3), LC/MS/MS is recommended: order  code 16109 (patients >57yrs). . See Note 1 . Note 1 . For additional information, please refer to  http://education.Quest[facility].com/faq/FAQ199  (This link is being provided for informational/ educational purposes only.)          Passed - Valid encounter within last 12 months    Recent Outpatient Visits           1 month ago Atherosclerosis of aorta [facility])   [facility] Al[facility], MD   7 months ago Hypothyroidism (acquired)   [place] [facility] Al[facility], MD   7 months ago Atherosclerosis of aorta [facility])   [place] [facility] Alba Cory, MD   10 months ago Superficial burn of chest wall, initial encounter   [facility], MD   1 year ago Hypertension,  benign   [facility] Margarita Mail, DO       Future Appointments             In 4 months Carlynn Purl, Danna Hefty, MD [facility], [facility]

## 2023-03-21 ENCOUNTER — Other Ambulatory Visit: Payer: Self-pay | Admitting: Family Medicine

## 2023-03-21 DIAGNOSIS — E039 Hypothyroidism, unspecified: Secondary | ICD-10-CM

## 2023-03-22 ENCOUNTER — Ambulatory Visit: Payer: Self-pay

## 2023-03-22 NOTE — Telephone Encounter (Signed)
Chief Complaint: Vaginal burning Symptoms: Moderate vaginal burning Frequency: constant Pertinent Negatives: Patient denies other vaginal symptoms Disposition: [] ED /[] Urgent Care (no appt availability in office) / [x] Appointment(In office/virtual)/ []  [facility]/ [] Home Care/ [] Refused Recommended Disposition /[] [facility]/ []  Follow-up with PCP Additional Notes: Spoke to [name] the appointment manager at the group home the patient is a resident at. [name] stated the patient sprayed Producer, television/film/video on the vaginal area and was taken to urgent care about 2 weeks ago. Patient continues to complain about vaginal burning at this time. It was advised for patient to follow-up with PCP if symptoms continue. Care advice was given and patient has been scheduled to be seen by PCP on 03/24/23.  Summary: Burning in Vaginal Area   [name],  who works at the group home where the patient is at, has called stating the patient is having burning in her vaginal area. States it's not while urinating just complaining of burning in the vaginal area and no other symptoms. No appt's available. Please advise.   Rolin Barry # [phone]     Reason for Disposition  All other vaginal symptoms  (Exception: Feels like prior yeast infection, minor abrasion, mild rash < 24 hour duration, mild itching.)  Answer Assessment - Initial Assessment Questions 1. SYMPTOM: "What's the main symptom you're concerned about?" (e.g., pain, itching, dryness)     Burning 2. LOCATION: "Where is the burning located?" (e.g., inside/outside, left/right)     Outside  3. ONSET: "When did the  burning  start?"     2 weeks ago  4. PAIN: "Is there any pain?" If Yes, ask: "How bad is it?" (Scale: 1-10; mild, moderate, severe)   -  MILD (1-3): Doesn't interfere with normal activities.    -  MODERATE (4-7): Interferes with normal activities (e.g., work or school) or awakens from sleep.     -  SEVERE  (8-10): Excruciating pain, unable to do any normal activities.     Moderate 5. ITCHING: "Is there any itching?" If Yes, ask: "How bad is it?" (Scale: 1-10; mild, moderate, severe)     No 6. CAUSE: "What do you think is causing the discharge?" "Have you had the same problem before? What happened then?"     No  7. OTHER SYMPTOMS: "Do you have any other symptoms?" (e.g., fever, itching, vaginal bleeding, pain with urination, injury to genital area, vaginal foreign body)     No  Protocols used: Vaginal Symptoms-A-AH

## 2023-03-23 NOTE — Progress Notes (Signed)
Name: Rebecca Bruce   MRN: [id number]    DOB: 1961-01-13   Date:03/24/2023       Progress Note  Subjective  Chief Complaint  Chief Complaint  Patient presents with   vaginal burning    Patient sprayed Fabreeze  air freshener on the vaginal area and was taken to urgent care about 2 weeks ago. Patient continues to complain about vaginal burning at this time    HPI Discussed the use of AI scribe software for clinical note transcription with the patient, who gave verbal consent to proceed.  Discussed the use of AI scribe software for clinical note transcription with the patient, who gave verbal consent to proceed.  History of Present Illness   The patient, residing in a group home, was brought in for a consultation following a recent visit to an urgent care center. The primary concern was a burning sensation in the genital area, which had been ongoing for approximately three weeks. The patient reported applying Febreze to the area, which seemed to initiate the discomfort. However, at the time of the consultation, the patient denied any current pain or dysuria.  The patient's caregiver reported that the patient was still complaining of burning, but it was unclear whether this was during urination or a constant sensation. The patient was examined, and a swab  used to collect a sample for testing. The patient found the examination uncomfortable and expressed distress during the procedure, not allowing a good exam   The patient's medical history and the specifics of the urgent care visit were not available at the time of the consultation. The patient's current symptoms and the results of the examination were used to guide the treatment plan. The patient was advised to apply A and D ointment to the external genital area, and samples were sent for culture to test for yeast and bacterial vaginosis.   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   Hyperglycemia 07/13/2018   Pre-diabetes 01/05/2017   Dyslipidemia 07/02/2015   History of hyperthyroidism 07/02/2015   History of radioactive iodine thyroid ablation 07/02/2015   Hypothyroidism (acquired) 07/02/2015   Hypertension, benign 07/02/2015   Anxiety 07/02/2015   Vitamin D deficiency 07/02/2015   Insomnia 07/02/2015   OA (osteoarthritis) of knee 07/02/2015   GERD (gastroesophageal reflux disease) 07/02/2015    Past Surgical History:  Procedure Laterality Date   DENTAL SURGERY     IR ANGIOGRAM SELECTIVE EACH ADDITIONAL VESSEL  03/29/2019   IR ANGIOGRAM SELECTIVE EACH ADDITIONAL VESSEL  03/29/2019   IR ANGIOGRAM SELECTIVE EACH ADDITIONAL VESSEL  03/29/2019   IR ANGIOGRAM SELECTIVE EACH ADDITIONAL VESSEL  03/29/2019   IR ANGIOGRAM VISCERAL SELECTIVE  03/29/2019   IR ANGIOGRAM VISCERAL SELECTIVE  03/29/2019   IR US GUIDE VASC ACCESS RIGHT  03/29/2019    Family History  Problem Relation Age of Onset   Heart disease Mother    Breast cancer Neg Hx     Social History   Tobacco Use   Smoking status: Never   Smokeless tobacco: Never   Tobacco comments:    smoking cessation materials not required  Substance Use Topics   Alcohol use:  No    Alcohol/week: 0.0 standard drinks of alcohol     Current Outpatient Medications:    acetaminophen (TYLENOL) 325 MG tablet, Take 1-2 tablets (325-650 mg total) by mouth every 4 (four) hours as needed for mild pain., Disp:  , Rfl:    amLODipine (NORVASC) 5 MG tablet, TAKE 1 TABLET BY MOUTH ONCE DAILY, Disp: 7 tablet, Rfl: 10   atorvastatin (LIPITOR) 20 MG tablet, TAKE 1 TABLET BY MOUTH ONCE DAILY AT 6PM, Disp: 7 tablet, Rfl: 10   Calcium-Vitamin D-Vitamin  K (VIACTIV CALCIUM PLUS D) 650-12.5-40 MG-MCG-MCG CHEW, CHEW & SWALLOW 1 PIECE BY MOUTH TWICE DAILY, Disp: 100 tablet, Rfl: 11   clobetasol ointment (TEMOVATE) 0.05 %, Apply 1 Application topically 2 (two) times daily., Disp: , Rfl:    gabapentin (NEURONTIN) 300 MG capsule, Take 1 capsule (300 mg total) by mouth at bedtime., Disp: 900 capsule, Rfl: 1   hydrOXYzine (VISTARIL) 25 MG capsule, Take 1 capsule (25 mg total) by mouth every 8 (eight) hours as needed., Disp: 30 capsule, Rfl: 3   Ipratropium-Albuterol (COMBIVENT) 20-100 MCG/ACT AERS respimat, Inhale 1 puff into the lungs every 6 (six) hours as needed for wheezing., Disp: 4 g, Rfl: 0   levothyroxine (SYNTHROID) 125 MCG tablet, Take 1 tablet (125 mcg total) by mouth daily before breakfast., Disp: 30 tablet, Rfl: 1   LIQUID C 500 MG/5ML LIQD liquid, TAKE 5 ML (500MG ) BY MOUTH ONCE DAILY, Disp: 120 mL, Rfl: 10   LORazepam (ATIVAN) 0.5 MG tablet, Take 0.5 mg by mouth daily as needed for anxiety. Can take an extra pill 30 minutes after the first dose if needed, Disp: , Rfl:    Multiple Vitamins-Minerals (THERA-TABS M) TABS, TAKE 1 TABLET BY MOUTH ONCE DAILY, Disp: 7 tablet, Rfl: 10   PARoxetine (PAXIL) 30 MG tablet, TAKE 1 TABLET BY MOUTH EVERY NIGHT AT BEDTIME *DO NOT CRUSH OR CHEW* *HAZARDOUS DRUG: WEAR GLOVES*, Disp: 90 tablet, Rfl: 0   saccharomyces boulardii (FLORASTOR) 250 MG capsule, Take 1 capsule (250 mg total) by mouth daily., Disp: 30 capsule, Rfl: 0   Skin Protectants, Misc. (MINERIN CREME) CREA, APPLY 1 APPLIACTION  TOPICALLY ONCE DAILY  TO BILATERAL FEET, Disp: 396 g, Rfl: 10   zinc sulfate 220 (50 Zn) MG capsule, Take 1 capsule (220 mg total) by mouth daily., Disp: , Rfl:   No Known Allergies  I personally reviewed active problem list, medication list with the patient/caregiver today.   ROS  Ten systems reviewed and is negative except as mentioned in HPI    Objective  Vitals:   03/24/23 1030  BP: 132/82  Pulse: 78  Resp:  16  SpO2: 97%  Weight: 190 lb 12.8 oz (86.5 kg)  Height: 5\' 5"  (1.651 m)    Body mass index is 31.75 kg/m.  Physical Exam  Constitutional: Patient appears well-developed and well-nourished. Obese  No distress.  HEENT: head atraumatic, normocephalic Cardiovascular: Normal rate, regular rhythm and normal heart sounds.  No murmur heard. No BLE edema. Pulmonary/Chest: Effort normal and breath sounds normal. No respiratory distress. Abdominal: Soft.  There is no tenderness. Pelvic: only able to see the vulva, no irritation, collected vaginal fluid without using speculum ( patient refused)  Psychiatric:she only allowed collection of specimen, otherwise refused  Recent Results (from the past 2160 hours)  TSH     Status: Abnormal   Collection Time: 01/24/23 10:56 AM  Result Value Ref Range   TSH 21.94 (H) 0.40 - 4.50 mIU/L  POCT urinalysis  dipstick     Status: Abnormal   Collection Time: 03/24/23 10:45 AM  Result Value Ref Range   Color, UA Yellow    Clarity, UA Cloudy    Glucose, UA Negative Negative   Bilirubin, UA Negative    Ketones, UA Negative    Spec Grav, UA 1.020 1.010 - 1.025   Blood, UA Moderate    pH, UA 5.0 5.0 - 8.0   Protein, UA Negative Negative   Urobilinogen, UA 0.2 0.2 or 1.0 E.U./dL   Nitrite, UA Negative    Leukocytes, UA Moderate (2+) (A) Negative   Appearance Yellow    Odor Foul     Diabetic Foot Exam:     PHQ2/9:    03/24/2023   10:41 AM 08/09/2022   11:17 AM 07/25/2022   10:50 AM 05/05/2022   11:34 AM 06/14/2021    9:11 AM  Depression screen PHQ 2/9  Decreased Interest 0 0 0 0 0  Down, Depressed, Hopeless 0 0 0 0 0  PHQ - 2 Score 0 0 0 0 0  Altered sleeping 0  0 0 0  Tired, decreased energy 0  0 0 0  Change in appetite 0  0 0 0  Feeling bad or failure about yourself  0  0 0 0  Trouble concentrating 0  3 0 0  Moving slowly or fidgety/restless 0  0 0 0  Suicidal thoughts 0  0 0 0  PHQ-9 Score 0  3 0 0  Difficult doing work/chores Not  difficult at all        phq 9 is negative   Fall Risk:    01/24/2023    9:46 AM 08/09/2022   11:17 AM 07/25/2022   10:50 AM 05/05/2022   11:34 AM 07/23/2021   11:34 AM  Fall Risk   Falls in the past year? 0 0 0 0 0  Number falls in past yr:  0 0  0  Injury with Fall?  0 0  0  Risk for fall due to : No Fall Risks No Fall Risks No Fall Risks No Fall Risks   Follow up Falls prevention discussed Falls prevention discussed Falls prevention discussed Falls prevention discussed;Education provided     Assessment & Plan  Assessment and Plan    Vulvovaginal discomfort Patient reports burning sensation, but no current symptoms. No visible external burns. History of inappropriate use of Febreze spray on the genital area. Unclear history of recent urgent care visit and treatment. -Obtained swab for culture to rule out yeast and bacterial vaginosis. -Recommended A&D ointment for external use if needed. -Order urine test to rule out urinary tract infection. -Attempt to obtain records from recent urgent care visit for further information.  General Health Maintenance -Order TSH test.

## 2023-03-24 ENCOUNTER — Encounter: Payer: Self-pay | Admitting: Family Medicine

## 2023-03-24 ENCOUNTER — Ambulatory Visit (INDEPENDENT_AMBULATORY_CARE_PROVIDER_SITE_OTHER): Payer: Medicare Other | Admitting: Family Medicine

## 2023-03-24 ENCOUNTER — Other Ambulatory Visit (HOSPITAL_COMMUNITY)
Admission: RE | Admit: 2023-03-24 | Discharge: 2023-03-24 | Disposition: A | Discharge status: Home / Self Care | Payer: Medicare Other | Source: Ambulatory Visit | Attending: Family Medicine | Admitting: Family Medicine

## 2023-03-24 VITALS — BP 132/82 | HR 78 | Resp 16 | Ht 65.0 in | Wt 190.8 lb

## 2023-03-24 DIAGNOSIS — R300 Dysuria: Secondary | ICD-10-CM | POA: Diagnosis not present

## 2023-03-24 DIAGNOSIS — N9489 Other specified conditions associated with female genital organs and menstrual cycle: Secondary | ICD-10-CM | POA: Insufficient documentation

## 2023-03-24 DIAGNOSIS — E039 Hypothyroidism, unspecified: Secondary | ICD-10-CM | POA: Diagnosis not present

## 2023-03-24 LAB — POCT URINALYSIS DIPSTICK
Bilirubin, UA: NEGATIVE
Glucose, UA: NEGATIVE
Ketones, UA: NEGATIVE
Nitrite, UA: NEGATIVE
Protein, UA: NEGATIVE
Spec Grav, UA: 1.02 (ref 1.010–1.025)
Urobilinogen, UA: 0.2 U/dL
pH, UA: 5 (ref 5.0–8.0)

## 2023-03-25 LAB — URINE CULTURE
MICRO NUMBER:: 15850517
SPECIMEN QUALITY:: ADEQUATE

## 2023-03-25 LAB — TSH: TSH: 0.08 m[IU]/L — ABNORMAL LOW (ref 0.40–4.50)

## 2023-03-26 ENCOUNTER — Other Ambulatory Visit: Payer: Self-pay | Admitting: Family Medicine

## 2023-03-26 DIAGNOSIS — E039 Hypothyroidism, unspecified: Secondary | ICD-10-CM

## 2023-03-27 ENCOUNTER — Telehealth: Payer: Self-pay | Admitting: Family Medicine

## 2023-03-27 DIAGNOSIS — L280 Lichen simplex chronicus: Secondary | ICD-10-CM | POA: Diagnosis not present

## 2023-03-27 LAB — CERVICOVAGINAL ANCILLARY ONLY
Bacterial Vaginitis (gardnerella): NEGATIVE
Candida Glabrata: NEGATIVE
Candida Vaginitis: NEGATIVE
Comment: NEGATIVE
Comment: NEGATIVE
Comment: NEGATIVE
Comment: NEGATIVE
Trichomonas: NEGATIVE

## 2023-03-27 NOTE — Telephone Encounter (Signed)
Pt's caregiver Chrissie Noa is calling in to let the nurse know that pt is still getting the Thyroid medication every morning.

## 2023-03-28 ENCOUNTER — Other Ambulatory Visit: Payer: Self-pay | Admitting: Family Medicine

## 2023-03-28 DIAGNOSIS — E039 Hypothyroidism, unspecified: Secondary | ICD-10-CM

## 2023-03-28 MED ORDER — LEVOTHYROXINE SODIUM 125 MCG PO TABS: 125.0000 ug | ORAL_TABLET | Freq: Every day | ORAL | 1 refills | Status: DC

## 2023-03-28 NOTE — Telephone Encounter (Signed)
Per lab note, you will need to adjust thyroid med

## 2023-03-29 NOTE — Telephone Encounter (Signed)
Orders faxed

## 2023-03-31 ENCOUNTER — Ambulatory Visit
Admission: RE | Admit: 2023-03-31 | Discharge: 2023-03-31 | Disposition: A | Discharge status: Home / Self Care | Payer: Medicare Other | Source: Ambulatory Visit | Attending: Family Medicine | Admitting: Family Medicine

## 2023-03-31 DIAGNOSIS — Z1231 Encounter for screening mammogram for malignant neoplasm of breast: Secondary | ICD-10-CM | POA: Insufficient documentation

## 2023-05-16 DIAGNOSIS — F339 Major depressive disorder, recurrent, unspecified: Secondary | ICD-10-CM | POA: Diagnosis not present

## 2023-05-23 ENCOUNTER — Other Ambulatory Visit: Payer: Self-pay | Admitting: Family Medicine

## 2023-05-23 DIAGNOSIS — E039 Hypothyroidism, unspecified: Secondary | ICD-10-CM

## 2023-05-25 DIAGNOSIS — E039 Hypothyroidism, unspecified: Secondary | ICD-10-CM | POA: Diagnosis not present

## 2023-05-26 ENCOUNTER — Other Ambulatory Visit: Payer: Self-pay | Admitting: Family Medicine

## 2023-05-26 ENCOUNTER — Encounter: Payer: Self-pay | Admitting: Family Medicine

## 2023-05-26 DIAGNOSIS — E039 Hypothyroidism, unspecified: Secondary | ICD-10-CM

## 2023-05-26 LAB — TSH: TSH: 0.19 m[IU]/L — ABNORMAL LOW (ref 0.40–4.50)

## 2023-05-26 MED ORDER — LEVOTHYROXINE SODIUM 125 MCG PO TABS: 125.0000 ug | ORAL_TABLET | Freq: Every day | ORAL | 1 refills | Status: DC

## 2023-05-29 ENCOUNTER — Encounter: Payer: Self-pay | Admitting: Emergency Medicine

## 2023-05-29 ENCOUNTER — Ambulatory Visit
Admission: EM | Admit: 2023-05-29 | Discharge: 2023-05-29 | Disposition: A | Discharge status: Home / Self Care | Payer: Medicare Other | Attending: Emergency Medicine | Admitting: Emergency Medicine

## 2023-05-29 DIAGNOSIS — J09X2 Influenza due to identified novel influenza A virus with other respiratory manifestations: Secondary | ICD-10-CM | POA: Insufficient documentation

## 2023-05-29 LAB — RESP PANEL BY RT-PCR (FLU A&B, COVID) ARPGX2
Influenza A by PCR: POSITIVE — AB
Influenza B by PCR: NEGATIVE
SARS Coronavirus 2 by RT PCR: NEGATIVE

## 2023-05-29 MED ORDER — OSELTAMIVIR PHOSPHATE 75 MG PO CAPS
75.0000 mg | ORAL_CAPSULE | Freq: Two times a day (BID) | ORAL | 0 refills | Status: DC
Start: 2023-05-29 — End: 2023-07-21

## 2023-05-29 MED ORDER — PROMETHAZINE-DM 6.25-15 MG/5ML PO SYRP: 5.0000 mL | ORAL_SOLUTION | Freq: Four times a day (QID) | ORAL | 0 refills | Status: DC | PRN

## 2023-05-29 MED ORDER — IPRATROPIUM BROMIDE 0.06 % NA SOLN: 2.0000 | Freq: Four times a day (QID) | NASAL | 12 refills | Status: AC

## 2023-05-29 MED ORDER — BENZONATATE 100 MG PO CAPS: 200.0000 mg | ORAL_CAPSULE | Freq: Three times a day (TID) | ORAL | 0 refills | Status: DC

## 2023-05-29 NOTE — ED Provider Notes (Signed)
[facility]    CSN: [id number] Arrival date & time: 05/29/23  0932      History   Chief Complaint Chief Complaint  Patient presents with   Nasal Congestion   Cough    HPI Rebecca Bruce is a 63 y.o. female.   HPI  63 year old female with past medical history significant for mild intellectual disability, prediabetes, GERD, osteoarthritis, hypertension, hypothyroidism, dyslipidemia, vitamin D deficiency presents for evaluation of respiratory symptoms which began 4 days ago which consist of nasal congestion and a nonproductive cough.  She denies any fever, ear pain, sore throat, shortness breath, wheezing, or GI complaints.  Patient is from a group home and others there are experiencing similar symptoms.  1 person tested positive for RSV.  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   Hyperglycemia 07/13/2018   Pre-diabetes 01/05/2017   Dyslipidemia 07/02/2015   History of hyperthyroidism 07/02/2015   History of radioactive iodine thyroid ablation 07/02/2015   Hypothyroidism (acquired) 07/02/2015   Hypertension, benign 07/02/2015   Anxiety 07/02/2015   Vitamin D deficiency 07/02/2015   Insomnia 07/02/2015   OA (osteoarthritis) of knee 07/02/2015   GERD (gastroesophageal reflux disease) 07/02/2015    Past Surgical History:  Procedure Laterality Date   DENTAL SURGERY     IR ANGIOGRAM SELECTIVE EACH ADDITIONAL VESSEL  03/29/2019   IR ANGIOGRAM SELECTIVE EACH ADDITIONAL VESSEL  03/29/2019   IR ANGIOGRAM SELECTIVE EACH ADDITIONAL VESSEL  03/29/2019   IR ANGIOGRAM SELECTIVE EACH ADDITIONAL VESSEL  03/29/2019   IR  ANGIOGRAM VISCERAL SELECTIVE  03/29/2019   IR ANGIOGRAM VISCERAL SELECTIVE  03/29/2019   IR US GUIDE VASC ACCESS RIGHT  03/29/2019    OB History   No obstetric history on file.      Home Medications    Prior to Admission medications   Medication Sig Start Date End Date Taking? Authorizing Provider  benzonatate (TESSALON) 100 MG capsule Take 2 capsules (200 mg total) by mouth every 8 (eight) hours. 05/29/23  Yes Becky Augusta, NP  ipratropium (ATROVENT) 0.06 % nasal spray Place 2 sprays into both nostrils 4 (four) times daily. 05/29/23  Yes Becky Augusta, NP  oseltamivir (TAMIFLU) 75 MG capsule Take 1 capsule (75 mg total) by mouth every 12 (twelve) hours. 05/29/23  Yes Becky Augusta, NP  promethazine-dextromethorphan (PROMETHAZINE-DM) 6.25-15 MG/5ML syrup Take 5 mLs by mouth 4 (four) times daily as needed. 05/29/23  Yes Becky Augusta, NP  acetaminophen (TYLENOL) 325 MG tablet Take 1-2 tablets (325-650 mg total) by mouth every 4 (four) hours as needed for mild pain. 05/07/19   Love, Evlyn Kanner, PA-C  amLODipine (NORVASC) 5 MG tablet TAKE 1 TABLET BY MOUTH ONCE DAILY 01/24/23   Carlynn Purl, Danna Hefty, MD  atorvastatin (LIPITOR) 20 MG tablet TAKE 1 TABLET BY MOUTH ONCE DAILY AT [facility] 01/18/23   Alba Cory, MD  Calcium-Vitamin D-Vitamin K (VIACTIV CALCIUM PLUS D) 650-12.5-40 MG-MCG-MCG CHEW CHEW & SWALLOW 1 PIECE BY MOUTH TWICE DAILY 03/16/23   Alba Cory, MD  clobetasol ointment (TEMOVATE) 0.05 % Apply 1 Application topically 2 (two) times daily.    Jesusita Oka, MD  gabapentin (NEURONTIN) 300  MG capsule Take 1 capsule (300 mg total) by mouth at bedtime. 07/23/21   Margarita Mail, DO  hydrOXYzine (VISTARIL) 25 MG capsule Take 1 capsule (25 mg total) by mouth every 8 (eight) hours as needed. 06/14/21   Mecum, Erin E, PA-C  Ipratropium-Albuterol (COMBIVENT) 20-100 MCG/ACT AERS respimat Inhale 1 puff into the lungs every 6 (six) hours as needed for wheezing. 07/23/21   Margarita Mail, DO   levothyroxine (SYNTHROID) 125 MCG tablet Take 1 tablet (125 mcg total) by mouth daily before breakfast. Skip Sundays and recheck in 6 weeks 05/26/23   Alba Cory, MD  LIQUID C 500 MG/5ML LIQD liquid TAKE 5 ML (500MG ) BY MOUTH ONCE DAILY 07/14/22   Alba Cory, MD  LORazepam (ATIVAN) 0.5 MG tablet Take 0.5 mg by mouth daily as needed for anxiety. Can take an extra pill 30 minutes after the first dose if needed    Baldemar Lenis, MD  Multiple Vitamins-Minerals (THERA-TABS M) TABS TAKE 1 TABLET BY MOUTH ONCE DAILY 01/18/23   Alba Cory, MD  PARoxetine (PAXIL) 30 MG tablet TAKE 1 TABLET BY MOUTH EVERY NIGHT AT BEDTIME *DO NOT CRUSH OR CHEW* *HAZARDOUS DRUG: WEAR GLOVES* 01/18/23   Margarita Mail, DO  saccharomyces boulardii (FLORASTOR) 250 MG capsule Take 1 capsule (250 mg total) by mouth daily. 07/23/21   Margarita Mail, DO  Skin Protectants, Misc. (MINERIN CREME) CREA APPLY 1 APPLIACTION  TOPICALLY ONCE DAILY  TO BILATERAL FEET 06/09/22   Alba Cory, MD  zinc sulfate 220 (50 Zn) MG capsule Take 1 capsule (220 mg total) by mouth daily. 07/23/21   Margarita Mail, DO    Family History Family History  Problem Relation Age of Onset   Heart disease Mother    Breast cancer Neg Hx     Social History Social History   Tobacco Use   Smoking status: Never   Smokeless tobacco: Never   Tobacco comments:    smoking cessation materials not required  Vaping Use   Vaping status: Never Used  Substance Use Topics   Alcohol use: No    Alcohol/week: 0.0 standard drinks of alcohol   Drug use: No     Allergies   Patient has no known allergies.   Review of Systems Review of Systems  Constitutional:  Negative for fever.  HENT:  Positive for congestion. Negative for ear pain, rhinorrhea and sore throat.   Respiratory:  Positive for cough. Negative for shortness of breath and wheezing.   Gastrointestinal:  Negative for diarrhea, nausea and vomiting.  Musculoskeletal:   Negative for arthralgias and myalgias.  Neurological:  Negative for headaches.     Physical Exam Triage Vital Signs ED Triage Vitals  Encounter Vitals Group     BP 05/29/23 1031 (!) 144/93     Systolic BP Percentile --      Diastolic BP Percentile --      Pulse Rate 05/29/23 1031 99     Resp 05/29/23 1031 18     Temp 05/29/23 1031 98.5 F (36.9 C)     Temp Source 05/29/23 1031 Oral     SpO2 05/29/23 1031 94 %     Weight --      Height --      Head Circumference --      Peak Flow --      Pain Score 05/29/23 1029 0     Pain Loc --      Pain Education --      Exclude from Growth Chart --  No data found.  Updated Vital Signs BP (!) 144/93 (BP Location: Left Arm)   Pulse 99   Temp 98.5 F (36.9 C) (Oral)   Resp 18   SpO2 94%   Visual Acuity Right Eye Distance:   Left Eye Distance:   Bilateral Distance:    Right Eye Near:   Left Eye Near:    Bilateral Near:     Physical Exam Vitals and nursing note reviewed.  Constitutional:      Appearance: Normal appearance. She is not ill-appearing.  HENT:     Head: Normocephalic and atraumatic.     Right Ear: Tympanic membrane, ear canal and external ear normal. There is no impacted cerumen.     Left Ear: Tympanic membrane, ear canal and external ear normal. There is no impacted cerumen.     Nose: Congestion and rhinorrhea present.     Comments: Nasal mucosa is erythematous mildly edematous with scant clear discharge in both nares.    Mouth/Throat:     Mouth: Mucous membranes are moist.     Pharynx: Oropharynx is clear. No oropharyngeal exudate or posterior oropharyngeal erythema.  Cardiovascular:     Rate and Rhythm: Normal rate and regular rhythm.     Pulses: Normal pulses.     Heart sounds: Normal heart sounds. No murmur heard.    No friction rub. No gallop.  Pulmonary:     Effort: Pulmonary effort is normal.     Breath sounds: Normal breath sounds. No wheezing, rhonchi or rales.  Musculoskeletal:     Cervical  back: Normal range of motion and neck supple. No tenderness.  Lymphadenopathy:     Cervical: No cervical adenopathy.  Skin:    General: Skin is warm and dry.     Capillary Refill: Capillary refill takes less than 2 seconds.     Findings: No rash.  Neurological:     General: No focal deficit present.     Mental Status: She is alert and oriented to person, place, and time.      UC Treatments / Results  Labs (all labs ordered are listed, but only abnormal results are displayed) Labs Reviewed  RESP PANEL BY RT-PCR (FLU A&B, COVID) ARPGX2 - Abnormal; Notable for the following components:      Result Value   Influenza A by PCR POSITIVE (*)    All other components within normal limits    EKG   Radiology No results found.  Procedures Procedures (including critical care time)  Medications Ordered in UC Medications - No data to display  Initial Impression / Assessment and Plan / UC Course  I have reviewed the triage vital signs and the nursing notes.  Pertinent labs & imaging results that were available during my care of the patient were reviewed by me and considered in my medical decision making (see chart for details).   Patient is a nontoxic-appearing 63 year old female presenting for evaluation of 4 days with respiratory symptoms as outlined HPI above.  Her physical exam does reveal inflammation of her upper respiratory tract with inflamed nasal mucosa with scant clear nasal discharge.  Oropharyngeal and pulm exams are benign.  She does live in a group home with others experiencing similar symptoms.  Differential diagnosis include COVID, influenza, or respiratory illness.  COVID and flu PCR was collected at triage.  Given that patient lives in a group home I would be inclined to prescribe antivirals even though she has had symptoms for 4 days if she is in fact  flu positive.  Respiratory panel is positive for influenza A.  I will discharge patient home with a diagnosis of  influenza A and start her on Tamiflu 75 mg twice daily for 5 days.  Atrovent nasal spray for nasal congestion and Tessalon Perles and Promethazine DM cough syrup for cough and congestion.  She may use over-the-counter Tylenol and/or ibuprofen as needed for fever or pain.     Final Clinical Impressions(s) / UC Diagnoses   Final diagnoses:  Influenza due to identified novel influenza A virus with other respiratory manifestations     Discharge Instructions      Take the Tamiflu twice daily for 5 days for treatment of influenza.  Use over-the-counter Tylenol and/or ibuprofen according to pack instructions as needed for any fever or pain.  Use the Atrovent nasal spray, 2 squirts up each nostril every 6 hours, as needed for nasal congestion and runny nose.  Use over-the-counter Delsym, Zarbee's, or Robitussin during the day as needed for cough.  Use the Tessalon Perles every 8 hours as needed for cough.  Taken with a small sip of water.  You may experience some numbness to your tongue or metallic taste in your mouth, this is normal.  Use the Promethazine DM cough syrup at bedtime as will make you drowsy but it should help dry up your postnasal drip and aid you in sleep and cough relief.  Return for reevaluation, or see your primary care provider, for new or worsening symptoms.      ED Prescriptions     Medication Sig Dispense Auth. Provider   benzonatate (TESSALON) 100 MG capsule Take 2 capsules (200 mg total) by mouth every 8 (eight) hours. 21 capsule Becky Augusta, NP   ipratropium (ATROVENT) 0.06 % nasal spray Place 2 sprays into both nostrils 4 (four) times daily. 15 mL Becky Augusta, NP   promethazine-dextromethorphan (PROMETHAZINE-DM) 6.25-15 MG/5ML syrup Take 5 mLs by mouth 4 (four) times daily as needed. 118 mL Becky Augusta, NP   oseltamivir (TAMIFLU) 75 MG capsule Take 1 capsule (75 mg total) by mouth every 12 (twelve) hours. 10 capsule Becky Augusta, NP      PDMP not  reviewed this encounter.   Becky Augusta, NP 05/29/23 1139

## 2023-05-29 NOTE — Discharge Instructions (Addendum)
 Take the Tamiflu twice daily for 5 days for treatment of influenza.  Use over-the-counter Tylenol and/or ibuprofen according to pack instructions as needed for any fever or pain.  Use the Atrovent nasal spray, 2 squirts up each nostril every 6 hours, as needed for nasal congestion and runny nose.  Use over-the-counter Delsym, Zarbee's, or Robitussin during the day as needed for cough.  Use the Tessalon Perles every 8 hours as needed for cough.  Taken with a small sip of water.  You may experience some numbness to your tongue or metallic taste in your mouth, this is normal.  Use the Promethazine DM cough syrup at bedtime as will make you drowsy but it should help dry up your postnasal drip and aid you in sleep and cough relief.  Return for reevaluation, or see your primary care provider, for new or worsening symptoms.

## 2023-05-29 NOTE — ED Triage Notes (Signed)
Patient presents with c/o non productive cough, and nasal congestion x 4 days.

## 2023-07-04 ENCOUNTER — Other Ambulatory Visit: Payer: Self-pay | Admitting: Family Medicine

## 2023-07-21 ENCOUNTER — Encounter: Payer: Self-pay | Admitting: Family Medicine

## 2023-07-21 ENCOUNTER — Ambulatory Visit (INDEPENDENT_AMBULATORY_CARE_PROVIDER_SITE_OTHER): Payer: Self-pay | Admitting: Family Medicine

## 2023-07-21 VITALS — BP 118/74 | HR 94 | Resp 16 | Ht 65.0 in | Wt 194.3 lb

## 2023-07-21 DIAGNOSIS — J302 Other seasonal allergic rhinitis: Secondary | ICD-10-CM

## 2023-07-21 DIAGNOSIS — E785 Hyperlipidemia, unspecified: Secondary | ICD-10-CM

## 2023-07-21 DIAGNOSIS — J4520 Mild intermittent asthma, uncomplicated: Secondary | ICD-10-CM | POA: Diagnosis not present

## 2023-07-21 DIAGNOSIS — I700 Atherosclerosis of aorta: Secondary | ICD-10-CM

## 2023-07-21 DIAGNOSIS — F70 Mild intellectual disabilities: Secondary | ICD-10-CM

## 2023-07-21 DIAGNOSIS — E039 Hypothyroidism, unspecified: Secondary | ICD-10-CM

## 2023-07-21 DIAGNOSIS — R739 Hyperglycemia, unspecified: Secondary | ICD-10-CM | POA: Diagnosis not present

## 2023-07-21 DIAGNOSIS — I10 Essential (primary) hypertension: Secondary | ICD-10-CM | POA: Diagnosis not present

## 2023-07-21 DIAGNOSIS — E559 Vitamin D deficiency, unspecified: Secondary | ICD-10-CM | POA: Diagnosis not present

## 2023-07-21 MED ORDER — LEVOCETIRIZINE DIHYDROCHLORIDE 5 MG PO TABS: 5.0000 mg | ORAL_TABLET | Freq: Every evening | ORAL | 3 refills | Status: AC

## 2023-07-21 MED ORDER — MONTELUKAST SODIUM 10 MG PO TABS: 10.0000 mg | ORAL_TABLET | Freq: Every day | ORAL | 2 refills | Status: DC

## 2023-07-21 NOTE — Progress Notes (Signed)
 Name: Rebecca Bruce   MRN: [id number]    DOB: [birth year]   Date:07/21/2023       Progress Note  Subjective  Chief Complaint  Chief Complaint  Patient presents with   Medical Management of Chronic Issues   HPI   HTN: taking medications, no side effects, denies chest pain or palpitation.  She lives in a group home, [name] . BP is at goal    Hyperlipidemia/Atherosclerosis of aorta : taking medication as prescribed and no problems, denies myalgias. We will recheck labs today    Hypothyroidism : used to see  Dr. Dario Guardian, s/p ablation for hyperthyroidism, currently on 125  mcg M-Saturdays , denies dysphagia, no change in bowel movements. Last TSH was suppressed and we will check it again today    MR: stable, sees psychiatrist, Dr. Ave Filter  for her behavior, she lives in a group home She needs assistance bathing and dressing, managing her money. Her brother [name] is the guardian, her behavior has been good , but refuses procedures and unable to do a pap smear, she is up to date with mammogram and cologuard   Pre-diabetes: hgbA1C was 6.3%  In 2017, last visit it was 5.8 % , denies polyphagia, polydipsia or polyuria. 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 Unchanged  AR seasonal and RAD mild intermittent: she has nasal congestion and dry cough at night, seems to be seasonal, we will add xyzal and singulair. 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   Hyperglycemia 07/13/2018   Pre-diabetes 01/05/2017    Dyslipidemia 07/02/2015   History of hyperthyroidism 07/02/2015   History of radioactive iodine thyroid ablation 07/02/2015   Hypothyroidism (acquired) 07/02/2015   Hypertension, benign 07/02/2015   Anxiety 07/02/2015   Vitamin D deficiency 07/02/2015   Insomnia 07/02/2015   OA (osteoarthritis) of knee 07/02/2015   GERD (gastroesophageal reflux disease) 07/02/2015    Past Surgical History:  Procedure Laterality Date   DENTAL SURGERY     IR ANGIOGRAM SELECTIVE EACH ADDITIONAL VESSEL  03/29/2019   IR ANGIOGRAM SELECTIVE EACH ADDITIONAL VESSEL  03/29/2019   IR ANGIOGRAM SELECTIVE EACH ADDITIONAL VESSEL  03/29/2019   IR ANGIOGRAM SELECTIVE EACH ADDITIONAL VESSEL  03/29/2019   IR ANGIOGRAM VISCERAL SELECTIVE  03/29/2019   IR ANGIOGRAM VISCERAL SELECTIVE  03/29/2019   IR US GUIDE VASC ACCESS RIGHT  03/29/2019    Family History  Problem Relation Age of Onset   Heart disease Mother    Breast cancer Neg Hx     Social History   Tobacco Use   Smoking status: Never   Smokeless tobacco: Never   Tobacco comments:    smoking cessation materials not required  Substance Use Topics   Alcohol use: No    Alcohol/week: 0.0 standard drinks of alcohol     Current Outpatient Medications:    acetaminophen (TYLENOL) 325 MG tablet, Take 1-2 tablets (325-650 mg total) by mouth every 4 (four) hours as needed for mild pain.,  Disp:  , Rfl:    amLODipine (NORVASC) 5 MG tablet, TAKE 1 TABLET BY MOUTH ONCE DAILY, Disp: 7 tablet, Rfl: 10   atorvastatin (LIPITOR) 20 MG tablet, TAKE 1 TABLET BY MOUTH ONCE DAILY AT 6PM, Disp: 7 tablet, Rfl: 10   Calcium-Vitamin D-Vitamin K (VIACTIV CALCIUM PLUS D) 650-12.5-40 MG-MCG-MCG CHEW, CHEW & SWALLOW 1 PIECE BY MOUTH TWICE DAILY, Disp: 100 tablet, Rfl: 11   clobetasol ointment (TEMOVATE) 0.05 %, Apply 1 Application topically 2 (two) times daily., Disp: , Rfl:    gabapentin (NEURONTIN) 300 MG capsule, TAKE 1 CAPSULE BY MOUTH AT BEDTIME *DO NOT CRUSH OR CHEW*, Disp:  2 capsule, Rfl: 10   hydrOXYzine (VISTARIL) 25 MG capsule, Take 1 capsule (25 mg total) by mouth every 8 (eight) hours as needed., Disp: 30 capsule, Rfl: 3   ipratropium (ATROVENT) 0.06 % nasal spray, Place 2 sprays into both nostrils 4 (four) times daily., Disp: 15 mL, Rfl: 12   Ipratropium-Albuterol (COMBIVENT) 20-100 MCG/ACT AERS respimat, Inhale 1 puff into the lungs every 6 (six) hours as needed for wheezing., Disp: 4 g, Rfl: 0   levothyroxine (SYNTHROID) 125 MCG tablet, Take 1 tablet (125 mcg total) by mouth daily before breakfast. Skip Sundays and recheck in 6 weeks, Disp: 30 tablet, Rfl: 1   LIQUID C 500 MG/5ML LIQD liquid, TAKE 5 ML (500MG ) BY MOUTH ONCE DAILY, Disp: 120 mL, Rfl: 10   LORazepam (ATIVAN) 0.5 MG tablet, Take 0.5 mg by mouth daily as needed for anxiety. Can take an extra pill 30 minutes after the first dose if needed, Disp: , Rfl:    Multiple Vitamins-Minerals (THERA-TABS M) TABS, TAKE 1 TABLET BY MOUTH ONCE DAILY, Disp: 7 tablet, Rfl: 10   oseltamivir (TAMIFLU) 75 MG capsule, Take 1 capsule (75 mg total) by mouth every 12 (twelve) hours., Disp: 10 capsule, Rfl: 0   PARoxetine (PAXIL) 30 MG tablet, TAKE 1 TABLET BY MOUTH EVERY NIGHT AT BEDTIME *DO NOT CRUSH OR CHEW* *HAZARDOUS DRUG: WEAR GLOVES*, Disp: 90 tablet, Rfl: 0   promethazine-dextromethorphan (PROMETHAZINE-DM) 6.25-15 MG/5ML syrup, Take 5 mLs by mouth 4 (four) times daily as needed., Disp: 118 mL, Rfl: 0   saccharomyces boulardii (FLORASTOR) 250 MG capsule, Take 1 capsule (250 mg total) by mouth daily., Disp: 30 capsule, Rfl: 0   Skin Protectants, Misc. (MINERIN CREME) CREA, APPLY 1 APPLIACTION  TOPICALLY ONCE DAILY  TO BILATERAL FEET, Disp: 396 g, Rfl: 10   Zinc Sulfate 220 (50 Zn) MG TABS, TAKE 1 TABLET BY MOUTH ONCE DAILY, Disp: 2 tablet, Rfl: 10   benzonatate (TESSALON) 100 MG capsule, Take 2 capsules (200 mg total) by mouth every 8 (eight) hours. (Patient not taking: Reported on 07/21/2023), Disp: 21 capsule, Rfl:  0  No Known Allergies  I personally reviewed active problem list, medication list, allergies with the patient/caregiver today.   ROS  Ten systems reviewed and is negative except as mentioned in HPI    Objective Physical Exam Constitutional: Patient appears well-developed and well-nourished. Obese  No distress.  HEENT: head atraumatic, normocephalic, pupils equal and reactive to light, neck supple Cardiovascular: Normal rate, regular rhythm and normal heart sounds.  No murmur heard. Trace  BLE edema., no calf tenderness  Pulmonary/Chest: Effort normal and breath sounds normal. No respiratory distress. Abdominal: Soft.  There is no tenderness. Psychiatric: Patient has a normal mood and affect. behavior is normal. Judgment and thought content normal.   Vitals:   07/21/23 1121  BP: 118/74  Pulse: 94  Resp: 16  SpO2: 97%  Weight: 194 lb 4.8 oz (88.1 kg)  Height: 5\' 5"  (1.651 m)    Body mass index is 32.33 kg/m.  Recent Results (from the past 2160 hours)  TSH     Status: Abnormal   Collection Time: 05/25/23 11:40 AM  Result Value Ref Range   TSH 0.19 (L) 0.40 - 4.50 mIU/L  Resp Panel by RT-PCR (Flu A&B, Covid) Anterior Nasal Swab     Status: Abnormal   Collection Time: 05/29/23 10:37 AM   Specimen: Anterior Nasal Swab  Result Value Ref Range   SARS Coronavirus 2 by RT PCR NEGATIVE NEGATIVE    Comment: (NOTE) SARS-CoV-2 target nucleic acids are NOT DETECTED.  The SARS-CoV-2 RNA is generally detectable in upper respiratory specimens during the acute phase of infection. The lowest concentration of SARS-CoV-2 viral copies this assay can detect is 138 copies/mL. A negative result does not preclude SARS-Cov-2 infection and should not be used as the sole basis for treatment or other patient management decisions. A negative result may occur with  improper specimen collection/handling, submission of specimen other than nasopharyngeal swab, presence of viral mutation(s) within  the areas targeted by this assay, and inadequate number of viral copies(<138 copies/mL). A negative result must be combined with clinical observations, patient history, and epidemiological information. The expected result is Negative.  Fact Sheet for Patients:  BloggerCourse.com  Fact Sheet for Healthcare Providers:  SeriousBroker.it  This test is no t yet approved or cleared by the [place] FDA and  has been authorized for detection and/or diagnosis of SARS-CoV-2 by FDA under an Emergency Use Authorization (EUA). This EUA will remain  in effect (meaning this test can be used) for the duration of the COVID-19 declaration under Section 564(b)(1) of the Act, 21 U.S.C.section 360bbb-3(b)(1), unless the authorization is terminated  or revoked sooner.       Influenza A by PCR POSITIVE (A) NEGATIVE   Influenza B by PCR NEGATIVE NEGATIVE    Comment: (NOTE) The Xpert Xpress SARS-CoV-2/FLU/RSV plus assay is intended as an aid in the diagnosis of influenza from Nasopharyngeal swab specimens and should not be used as a sole basis for treatment. Nasal washings and aspirates are unacceptable for Xpert Xpress SARS-CoV-2/FLU/RSV testing.  Fact Sheet for Patients: BloggerCourse.com  Fact Sheet for Healthcare Providers: SeriousBroker.it  This test is not yet approved or cleared by the [place] FDA and has been authorized for detection and/or diagnosis of SARS-CoV-2 by FDA under an Emergency Use Authorization (EUA). This EUA will remain in effect (meaning this test can be used) for the duration of the COVID-19 declaration under Section 564(b)(1) of the Act, 21 U.S.C. section 360bbb-3(b)(1), unless the authorization is terminated or revoked.  Performed at [facility], [address], [place], [place] [address]       PHQ2/9:    07/21/2023   11:19 AM 03/24/2023    10:41 AM 08/09/2022   11:17 AM 07/25/2022   10:50 AM 05/05/2022   11:34 AM  Depression screen PHQ 2/9  Decreased Interest 0 0 0 0 0  Down, Depressed, Hopeless 0 0 0 0 0  PHQ - 2 Score 0 0 0 0 0  Altered sleeping 0 0  0 0  Tired, decreased energy 0 0  0 0  Change in appetite 0 0  0 0  Feeling bad or failure about yourself  0 0  0 0  Trouble concentrating 0 0  3 0  Moving slowly  or fidgety/restless 0 0  0 0  Suicidal thoughts 0 0  0 0  PHQ-9 Score 0 0  3 0  Difficult doing work/chores Not difficult at all Not difficult at all       phq 9 is negative  Fall Risk:    01/24/2023    9:46 AM 08/09/2022   11:17 AM 07/25/2022   10:50 AM 05/05/2022   11:34 AM 07/23/2021   11:34 AM  Fall Risk   Falls in the past year? 0 0 0 0 0  Number falls in past yr:  0 0  0  Injury with Fall?  0 0  0  Risk for fall due to : No Fall Risks No Fall Risks No Fall Risks No Fall Risks   Follow up Falls prevention discussed Falls prevention discussed Falls prevention discussed Falls prevention discussed;Education provided      Assessment & Plan  1. Atherosclerosis of aorta (HCC) (Primary)  - Lipid panel  2. Hypertension, benign  - CBC with Differential/Platelet - Comprehensive metabolic panel with GFR  3. Mild intellectual disabilities  Stable  4. Dyslipidemia  - Lipid panel  5. Vitamin D deficiency  - VITAMIN D 25 Hydroxy (Vit-D Deficiency, Fractures)  6. Seasonal allergies  - montelukast (SINGULAIR) 10 MG tablet; Take 1 tablet (10 mg total) by mouth at bedtime. From March until June yearly  Dispense: 30 tablet; Refill: 2 - levocetirizine (XYZAL) 5 MG tablet; Take 1 tablet (5 mg total) by mouth every evening. From March until June yearly  Dispense: 30 tablet; Refill: 3  7. Mild intermittent reactive airway disease without complication  - montelukast (SINGULAIR) 10 MG tablet; Take 1 tablet (10 mg total) by mouth at bedtime. From March until June yearly  Dispense: 30 tablet; Refill: 2 -  levocetirizine (XYZAL) 5 MG tablet; Take 1 tablet (5 mg total) by mouth every evening. From March until June yearly  Dispense: 30 tablet; Refill: 3  8. Hypothyroidism (acquired)  - TSH  9. Hyperglycemia  - Hemoglobin A1c

## 2023-07-22 LAB — COMPREHENSIVE METABOLIC PANEL WITH GFR
AG Ratio: 1.3 (calc) (ref 1.0–2.5)
ALT: 14 U/L (ref 6–29)
AST: 13 U/L (ref 10–35)
Albumin: 3.9 g/dL (ref 3.6–5.1)
Alkaline phosphatase (APISO): 87 U/L (ref 37–153)
BUN: 14 mg/dL (ref 7–25)
CO2: 30 mmol/L (ref 20–32)
Calcium: 9.2 mg/dL (ref 8.6–10.4)
Chloride: 102 mmol/L (ref 98–110)
Creat: 0.84 mg/dL (ref 0.50–1.05)
Globulin: 2.9 g/dL (ref 1.9–3.7)
Glucose, Bld: 151 mg/dL — ABNORMAL HIGH (ref 65–99)
Potassium: 3.7 mmol/L (ref 3.5–5.3)
Sodium: 141 mmol/L (ref 135–146)
Total Bilirubin: 0.5 mg/dL (ref 0.2–1.2)
Total Protein: 6.8 g/dL (ref 6.1–8.1)
eGFR: 79 mL/min/{1.73_m2} (ref >=60)

## 2023-07-22 LAB — CBC WITH DIFFERENTIAL/PLATELET
Absolute Lymphocytes: 2498 {cells}/uL (ref 850–3900)
Absolute Monocytes: 758 {cells}/uL (ref 200–950)
Basophils Absolute: 53 {cells}/uL (ref 0–200)
Basophils Relative: 0.7 %
Eosinophils Absolute: 255 {cells}/uL (ref 15–500)
Eosinophils Relative: 3.4 %
HCT: 41.3 % (ref 35.0–45.0)
Hemoglobin: 13.2 g/dL (ref 11.7–15.5)
MCH: 26.6 pg — ABNORMAL LOW (ref 27.0–33.0)
MCHC: 32 g/dL (ref 32.0–36.0)
MCV: 83.3 fL (ref 80.0–100.0)
MPV: 10.1 fL (ref 7.5–12.5)
Monocytes Relative: 10.1 %
Neutro Abs: 3938 {cells}/uL (ref 1500–7800)
Neutrophils Relative %: 52.5 %
Platelets: 331 10*3/uL (ref 140–400)
RBC: 4.96 10*6/uL (ref 3.80–5.10)
RDW: 12.9 % (ref 11.0–15.0)
Total Lymphocyte: 33.3 %
WBC: 7.5 10*3/uL (ref 3.8–10.8)

## 2023-07-22 LAB — HEMOGLOBIN A1C
Hgb A1c MFr Bld: 5.8 %{Hb} — ABNORMAL HIGH (ref <5.7)
Mean Plasma Glucose: 120 mg/dL
eAG (mmol/L): 6.6 mmol/L

## 2023-07-22 LAB — LIPID PANEL
Cholesterol: 169 mg/dL (ref <200)
HDL: 75 mg/dL (ref >=50)
LDL Cholesterol (Calc): 76 mg/dL
Non-HDL Cholesterol (Calc): 94 mg/dL (ref <130)
Total CHOL/HDL Ratio: 2.3 (calc) (ref <5.0)
Triglycerides: 94 mg/dL (ref <150)

## 2023-07-22 LAB — VITAMIN D 25 HYDROXY (VIT D DEFICIENCY, FRACTURES): Vit D, 25-Hydroxy: 42 ng/mL (ref 30–100)

## 2023-07-22 LAB — TSH: TSH: 0.28 m[IU]/L — ABNORMAL LOW (ref 0.40–4.50)

## 2023-07-25 ENCOUNTER — Ambulatory Visit: Payer: Self-pay | Admitting: Nurse Practitioner

## 2023-07-26 ENCOUNTER — Other Ambulatory Visit: Payer: Self-pay

## 2023-07-26 ENCOUNTER — Encounter: Payer: Self-pay | Admitting: Internal Medicine

## 2023-07-26 ENCOUNTER — Ambulatory Visit (INDEPENDENT_AMBULATORY_CARE_PROVIDER_SITE_OTHER): Admitting: Internal Medicine

## 2023-07-26 VITALS — BP 120/80 | HR 99 | Temp 98.1°F | Resp 16 | Ht 65.0 in | Wt 194.4 lb

## 2023-07-26 DIAGNOSIS — Z23 Encounter for immunization: Secondary | ICD-10-CM

## 2023-07-26 DIAGNOSIS — E039 Hypothyroidism, unspecified: Secondary | ICD-10-CM

## 2023-07-26 DIAGNOSIS — Z0000 Encounter for general adult medical examination without abnormal findings: Secondary | ICD-10-CM | POA: Diagnosis not present

## 2023-07-26 NOTE — Progress Notes (Signed)
 Name: Rebecca Bruce   MRN: [id number]    DOB: 1960-07-07   Date:07/26/2023       Progress Note  Subjective  Chief Complaint  Chief Complaint  Patient presents with   Annual Exam    FL2    HPI  Patient presents for annual CPE. She is here with her caregiver. Recently had labs last week, all normal with exception of TSH.  Hypothyroidism: -Medications: Levothyroxine 125 mcg every day but held Sunday -Patient is compliant with the above medication (s) at the above dose and reports no medication side effects.  -Denies weight changes, cold./heat intolerance, skin changes, anxiety/palpitations  -Last TSH: 4/25  0.28   Diet: Regular Exercise: 7 days a week 60 minutes  Last Eye Exam:  will schedule Last Dental Exam: will schedule  Flowsheet Row Office Visit from 07/26/2023 in [facility]  AUDIT-C Score 0      Depression: Phq 9 is  negative    07/26/2023    9:07 AM 07/21/2023   11:19 AM 03/24/2023   10:41 AM 08/09/2022   11:17 AM 07/25/2022   10:50 AM  Depression screen PHQ 2/9  Decreased Interest 0 0 0 0 0  Down, Depressed, Hopeless  0 0 0 0  PHQ - 2 Score 0 0 0 0 0  Altered sleeping  0 0  0  Tired, decreased energy  0 0  0  Change in appetite  0 0  0  Feeling bad or failure about yourself   0 0  0  Trouble concentrating  0 0  3  Moving slowly or fidgety/restless  0 0  0  Suicidal thoughts  0 0  0  PHQ-9 Score  0 0  3  Difficult doing work/chores  Not difficult at all Not difficult at all     Hypertension: BP Readings from Last 3 Encounters:  07/26/23 120/80  07/21/23 118/74  05/29/23 (!) 144/93   Obesity: Wt Readings from Last 3 Encounters:  07/26/23 194 lb 6.4 oz (88.2 kg)  07/21/23 194 lb 4.8 oz (88.1 kg)  03/24/23 190 lb 12.8 oz (86.5 kg)   BMI Readings from Last 3 Encounters:  07/26/23 32.35 kg/m  07/21/23 32.33 kg/m  03/24/23 31.75 kg/m     Vaccines: Prevnar 20, second shingles vaccine and RSV reviewed with the  patient.   STD testing and prevention (HIV/chl/gon/syphilis): NA Intimate partner violence: negative screen  Menstrual History/LMP/Abnormal Bleeding: NA Discussed importance of follow up if any post-menopausal bleeding: yes  Incontinence Symptoms: negative for symptoms   Breast cancer:  - Last Mammogram: 03/31/2023, Birads-1  Osteoporosis Prevention : Discussed high calcium and vitamin D supplementation, weight bearing exercises Bone density :not applicable   Cervical cancer screening: patient declined  Skin cancer: Discussed monitoring for atypical lesions  Colorectal cancer: 02/07/2021 Cologuard negative, will be due later this year Lung cancer:  Low Dose CT Chest recommended if Age 92-80 years, 20 pack-year currently smoking OR have quit w/in 15years. Patient does not qualify for screen   ECG: 03/29/2019  Advanced Care Planning: A voluntary discussion about advance care planning including the explanation and discussion of advance directives.  Discussed health care proxy and Living will, and the patient was able to identify a health care proxy as leiann sporer (brother) [employer] .  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   Hyperglycemia 07/13/2018   Pre-diabetes 01/05/2017   Dyslipidemia 07/02/2015   History of hyperthyroidism 07/02/2015   History of radioactive iodine thyroid ablation 07/02/2015   Hypothyroidism (acquired) 07/02/2015   Hypertension, benign 07/02/2015   Anxiety 07/02/2015   Vitamin D deficiency 07/02/2015   Insomnia 07/02/2015   OA (osteoarthritis) of knee 07/02/2015   GERD (gastroesophageal reflux disease) 07/02/2015    Past Surgical History:   Procedure Laterality Date   DENTAL SURGERY     IR ANGIOGRAM SELECTIVE EACH ADDITIONAL VESSEL  03/29/2019   IR ANGIOGRAM SELECTIVE EACH ADDITIONAL VESSEL  03/29/2019   IR ANGIOGRAM SELECTIVE EACH ADDITIONAL VESSEL  03/29/2019   IR ANGIOGRAM SELECTIVE EACH ADDITIONAL VESSEL  03/29/2019   IR ANGIOGRAM VISCERAL SELECTIVE  03/29/2019   IR ANGIOGRAM VISCERAL SELECTIVE  03/29/2019   IR US GUIDE VASC ACCESS RIGHT  03/29/2019    Family History  Problem Relation Age of Onset   Heart disease Mother    Breast cancer Neg Hx     Social History   Socioeconomic History   Marital status: Single    Spouse name: Not on file   Number of children: 0   Years of education: Not on file   Highest education level: Not on file  Occupational History   Occupation: Disabled  Tobacco Use   Smoking status: Never   Smokeless tobacco: Never   Tobacco comments:    smoking cessation materials not required  Vaping Use   Vaping status: Never Used  Substance and Sexual Activity   Alcohol use: No    Alcohol/week: 0.0 standard drinks of alcohol   Drug use: No   Sexual activity: Never  Other Topics Concern   Not on file  Social History Narrative   Stays At [employer]   She goes to church with her brother every Sunday   Participate in group activities   Works part time at the [employer]    Social Drivers of [employer]: Low Risk  (07/26/2023)   Overall Financial Resource Strain (CARDIA)    Difficulty of Paying Living Expenses: Not hard at all  Food Insecurity: No Food Insecurity (07/26/2023)   Hunger Vital Sign    Worried About Running Out of Food in the Last Year: Never true    Ran Out of Food in the Last Year: Never true  Transportation Needs: No Transportation Needs (07/26/2023)   PRAPARE - Administrator, Civil Service (Medical): No    Lack of Transportation (Non-Medical): No  Physical Activity: Sufficiently Active (07/26/2023)    Exercise Vital Sign    Days of Exercise per Week: 7 days    Minutes of Exercise per Session: 60 min  Stress: No Stress Concern Present (07/26/2023)   [employer] of Occupational Health - Occupational Stress Questionnaire    Feeling of Stress : Not at all  Social Connections: Moderately Isolated (07/26/2023)   Social Connection and Isolation Panel [NHANES]    Frequency of Communication with Friends and Family: Once a week    Frequency of Social Gatherings with Friends and Family: Once a week    Attends Religious Services: More than 4 times per year    Active Member of [employer] or Organizations: Yes    Attends Banker Meetings:  More than 4 times per year    Marital Status: Never married  Intimate Partner Violence: Not At Risk (07/26/2023)   Humiliation, Afraid, Rape, and Kick questionnaire    Fear of Current or Ex-Partner: No    Emotionally Abused: No    Physically Abused: No    Sexually Abused: No     Current Outpatient Medications:    acetaminophen (TYLENOL) 325 MG tablet, Take 1-2 tablets (325-650 mg total) by mouth every 4 (four) hours as needed for mild pain., Disp:  , Rfl:    amLODipine (NORVASC) 5 MG tablet, TAKE 1 TABLET BY MOUTH ONCE DAILY, Disp: 7 tablet, Rfl: 10   atorvastatin (LIPITOR) 20 MG tablet, TAKE 1 TABLET BY MOUTH ONCE DAILY AT 6PM, Disp: 7 tablet, Rfl: 10   Calcium-Vitamin D-Vitamin K (VIACTIV CALCIUM PLUS D) 650-12.5-40 MG-MCG-MCG CHEW, CHEW & SWALLOW 1 PIECE BY MOUTH TWICE DAILY, Disp: 100 tablet, Rfl: 11   clobetasol ointment (TEMOVATE) 0.05 %, Apply 1 Application topically 2 (two) times daily., Disp: , Rfl:    gabapentin (NEURONTIN) 300 MG capsule, TAKE 1 CAPSULE BY MOUTH AT BEDTIME *DO NOT CRUSH OR CHEW*, Disp: 2 capsule, Rfl: 10   hydrOXYzine (VISTARIL) 25 MG capsule, Take 1 capsule (25 mg total) by mouth every 8 (eight) hours as needed., Disp: 30 capsule, Rfl: 3   ipratropium (ATROVENT) 0.06 % nasal spray, Place 2 sprays into both nostrils 4  (four) times daily., Disp: 15 mL, Rfl: 12   Ipratropium-Albuterol (COMBIVENT) 20-100 MCG/ACT AERS respimat, Inhale 1 puff into the lungs every 6 (six) hours as needed for wheezing., Disp: 4 g, Rfl: 0   levocetirizine (XYZAL) 5 MG tablet, Take 1 tablet (5 mg total) by mouth every evening. From March until June yearly, Disp: 30 tablet, Rfl: 3   levothyroxine (SYNTHROID) 125 MCG tablet, Take 1 tablet (125 mcg total) by mouth daily before breakfast. Skip Sundays and recheck in 6 weeks, Disp: 30 tablet, Rfl: 1   LIQUID C 500 MG/5ML LIQD liquid, TAKE 5 ML (500MG ) BY MOUTH ONCE DAILY, Disp: 120 mL, Rfl: 10   LORazepam (ATIVAN) 0.5 MG tablet, Take 0.5 mg by mouth daily as needed for anxiety. Can take an extra pill 30 minutes after the first dose if needed, Disp: , Rfl:    montelukast (SINGULAIR) 10 MG tablet, Take 1 tablet (10 mg total) by mouth at bedtime. From March until June yearly, Disp: 30 tablet, Rfl: 2   Multiple Vitamins-Minerals (THERA-TABS M) TABS, TAKE 1 TABLET BY MOUTH ONCE DAILY, Disp: 7 tablet, Rfl: 10   PARoxetine (PAXIL) 30 MG tablet, TAKE 1 TABLET BY MOUTH EVERY NIGHT AT BEDTIME *DO NOT CRUSH OR CHEW* *HAZARDOUS DRUG: WEAR GLOVES*, Disp: 90 tablet, Rfl: 0   saccharomyces boulardii (FLORASTOR) 250 MG capsule, Take 1 capsule (250 mg total) by mouth daily., Disp: 30 capsule, Rfl: 0   Zinc Sulfate 220 (50 Zn) MG TABS, TAKE 1 TABLET BY MOUTH ONCE DAILY, Disp: 2 tablet, Rfl: 10   Skin Protectants, Misc. (MINERIN CREME) CREA, APPLY 1 APPLIACTION  TOPICALLY ONCE DAILY  TO BILATERAL FEET (Patient not taking: Reported on 07/26/2023), Disp: 396 g, Rfl: 10  No Known Allergies   Review of Systems  All other systems reviewed and are negative.    Objective  Vitals:   07/26/23 0907  BP: 120/80  Pulse: 99  Resp: 16  Temp: 98.1 F (36.7 C)  TempSrc: Oral  SpO2: 98%  Weight: 194 lb 6.4 oz (88.2 kg)  Height: 5\' 5"  (  1.651 m)    Body mass index is 32.35 kg/m.  Physical Exam Constitutional:       Appearance: Normal appearance.  HENT:     Head: Normocephalic and atraumatic.     Mouth/Throat:     Mouth: Mucous membranes are moist.     Pharynx: Oropharynx is clear.  Eyes:     Extraocular Movements: Extraocular movements intact.     Conjunctiva/sclera: Conjunctivae normal.     Pupils: Pupils are equal, round, and reactive to light.  Neck:     Comments: No thyromegaly Cardiovascular:     Rate and Rhythm: Normal rate and regular rhythm.  Pulmonary:     Effort: Pulmonary effort is normal.     Breath sounds: Normal breath sounds.  Abdominal:     General: There is no distension.     Palpations: Abdomen is soft.     Tenderness: There is no abdominal tenderness.  Musculoskeletal:     Cervical back: No tenderness.     Right lower leg: No edema.     Left lower leg: No edema.  Lymphadenopathy:     Cervical: No cervical adenopathy.  Skin:    General: Skin is warm and dry.  Neurological:     General: No focal deficit present.     Mental Status: She is alert. Mental status is at baseline.  Psychiatric:        Mood and Affect: Mood normal.        Behavior: Behavior normal.     Last CBC Lab Results  Component Value Date   WBC 7.5 07/21/2023   HGB 13.2 07/21/2023   HCT 41.3 07/21/2023   MCV 83.3 07/21/2023   MCH 26.6 (L) 07/21/2023   RDW 12.9 07/21/2023   PLT 331 07/21/2023   Last metabolic panel Lab Results  Component Value Date   GLUCOSE 151 (H) 07/21/2023   NA 141 07/21/2023   K 3.7 07/21/2023   CL 102 07/21/2023   CO2 30 07/21/2023   BUN 14 07/21/2023   CREATININE 0.84 07/21/2023   EGFR 79 07/21/2023   CALCIUM 9.2 07/21/2023   PHOS 3.2 04/10/2019   PROT 6.8 07/21/2023   ALBUMIN 3.1 (L) 05/15/2019   LABGLOB 3.0 01/09/2015   AGRATIO 1.3 01/09/2015   BILITOT 0.5 07/21/2023   ALKPHOS 73 05/15/2019   AST 13 07/21/2023   ALT 14 07/21/2023   ANIONGAP 10 05/15/2019   Last lipids Lab Results  Component Value Date   CHOL 169 07/21/2023   HDL 75  07/21/2023   LDLCALC 76 07/21/2023   TRIG 94 07/21/2023   CHOLHDL 2.3 07/21/2023   Last hemoglobin A1c Lab Results  Component Value Date   HGBA1C 5.8 (H) 07/21/2023   Last thyroid functions Lab Results  Component Value Date   TSH 0.28 (L) 07/21/2023   Last vitamin D Lab Results  Component Value Date   VD25OH 42 07/21/2023   Last vitamin B12 and Folate Lab Results  Component Value Date   VITAMINB12 1,297 (H) 04/01/2019      Assessment & Plan  1. Annual physical exam (Primary)/Vaccine for streptococcus pneumoniae and influenza: Physical exam completed, health maintenance reviewed and annual labs ordered. Prevnar 20 administered today, discussed obtaining RSV and second shingles vaccine at the pharmacy.   - Pneumococcal conjugate vaccine 20-valent (Prevnar 20) - TSH; Future  2. Hypothyroidism (acquired): TSH improved but still low, continue Levothyroxine 125 mcg but hold on Saturdays and Sundays, return in 6-8 weeks to recheck labs.   -  TSH; Future   -USPSTF grade A and B recommendations reviewed with patient; age-appropriate recommendations, preventive care, screening tests, etc discussed and encouraged; healthy living encouraged; see AVS for patient education given to patient -Discussed importance of 150 minutes of physical activity weekly, eat two servings of fish weekly, eat one serving of tree nuts ( cashews, pistachios, pecans, almonds.Aaron Aas) every other day, eat 6 servings of fruit/vegetables daily and drink plenty of water and avoid sweet beverages.   -Reviewed Health Maintenance: Yes.

## 2023-07-31 ENCOUNTER — Encounter: Payer: Self-pay | Admitting: Family Medicine

## 2023-07-31 ENCOUNTER — Other Ambulatory Visit: Payer: Self-pay | Admitting: Family Medicine

## 2023-07-31 DIAGNOSIS — E039 Hypothyroidism, unspecified: Secondary | ICD-10-CM

## 2023-07-31 MED ORDER — LEVOTHYROXINE SODIUM 100 MCG PO TABS: 100.0000 ug | ORAL_TABLET | Freq: Every day | ORAL | 1 refills | Status: DC

## 2023-08-14 ENCOUNTER — Other Ambulatory Visit: Payer: Self-pay | Admitting: Family Medicine

## 2023-08-15 DIAGNOSIS — F339 Major depressive disorder, recurrent, unspecified: Secondary | ICD-10-CM | POA: Diagnosis not present

## 2023-09-19 ENCOUNTER — Other Ambulatory Visit: Payer: Self-pay | Admitting: Internal Medicine

## 2023-09-19 ENCOUNTER — Other Ambulatory Visit: Payer: Self-pay | Admitting: Family Medicine

## 2023-09-19 DIAGNOSIS — E039 Hypothyroidism, unspecified: Secondary | ICD-10-CM

## 2023-09-19 DIAGNOSIS — F419 Anxiety disorder, unspecified: Secondary | ICD-10-CM

## 2023-09-19 DIAGNOSIS — F72 Severe intellectual disabilities: Secondary | ICD-10-CM

## 2023-09-21 NOTE — Telephone Encounter (Signed)
 Requested Prescriptions  Pending Prescriptions Disp Refills   PARoxetine  (PAXIL ) 30 MG tablet [Pharmacy Med Name: PARoxetine  HCl 30 MG Tablet] 90 tablet 1    Sig: TAKE 1 TABLET BY MOUTH EVERY NIGHT AT BEDTIME *DO NOT CRUSH OR CHEW* *HAZARDOUS DRUG: WEAR GLOVES*     Psychiatry:  Antidepressants - SSRI Passed - 09/21/2023 10:24 AM      Passed - Valid encounter within last 6 months    Recent Outpatient Visits           1 month ago Annual physical exam   [facility] Rockney Cid, DO   2 months ago Atherosclerosis of aorta [facility])   [facility] Arleen Lacer, MD       Future Appointments             In 10 months Sowles, Krichna, MD [facility]

## 2023-10-03 ENCOUNTER — Other Ambulatory Visit: Payer: Self-pay | Admitting: Family Medicine

## 2023-10-03 DIAGNOSIS — E039 Hypothyroidism, unspecified: Secondary | ICD-10-CM

## 2023-10-08 ENCOUNTER — Other Ambulatory Visit: Payer: Self-pay | Admitting: Family Medicine

## 2023-10-08 DIAGNOSIS — E039 Hypothyroidism, unspecified: Secondary | ICD-10-CM

## 2023-10-10 ENCOUNTER — Other Ambulatory Visit: Payer: Self-pay | Admitting: Family Medicine

## 2023-10-10 DIAGNOSIS — E039 Hypothyroidism, unspecified: Secondary | ICD-10-CM

## 2023-10-17 ENCOUNTER — Other Ambulatory Visit: Payer: Self-pay | Admitting: Family Medicine

## 2023-10-17 DIAGNOSIS — E039 Hypothyroidism, unspecified: Secondary | ICD-10-CM

## 2023-10-24 DIAGNOSIS — E039 Hypothyroidism, unspecified: Secondary | ICD-10-CM | POA: Diagnosis not present

## 2023-10-24 LAB — TSH: TSH: 0.31 m[IU]/L — ABNORMAL LOW (ref 0.40–4.50)

## 2023-10-25 ENCOUNTER — Other Ambulatory Visit: Payer: Self-pay | Admitting: Family Medicine

## 2023-10-25 ENCOUNTER — Ambulatory Visit: Payer: Self-pay | Admitting: Family Medicine

## 2023-10-25 DIAGNOSIS — E039 Hypothyroidism, unspecified: Secondary | ICD-10-CM

## 2023-10-25 MED ORDER — LEVOTHYROXINE SODIUM 100 MCG PO TABS: 100.0000 ug | ORAL_TABLET | Freq: Every day | ORAL | 1 refills | Status: DC

## 2023-11-14 DIAGNOSIS — F339 Major depressive disorder, recurrent, unspecified: Secondary | ICD-10-CM | POA: Diagnosis not present

## 2023-12-18 ENCOUNTER — Other Ambulatory Visit: Payer: Self-pay | Admitting: Family Medicine

## 2023-12-18 DIAGNOSIS — R051 Acute cough: Secondary | ICD-10-CM | POA: Diagnosis not present

## 2023-12-18 DIAGNOSIS — E039 Hypothyroidism, unspecified: Secondary | ICD-10-CM

## 2023-12-18 DIAGNOSIS — J069 Acute upper respiratory infection, unspecified: Secondary | ICD-10-CM | POA: Diagnosis not present

## 2023-12-26 ENCOUNTER — Telehealth: Payer: Self-pay

## 2023-12-26 DIAGNOSIS — E039 Hypothyroidism, unspecified: Secondary | ICD-10-CM

## 2023-12-26 NOTE — Telephone Encounter (Signed)
 Copied from CRM [id number]. Topic: Clinical - Request for Lab/Test Order >> Dec 26, 2023  1:29 PM Antwanette L wrote: Reason for CRM: Lenny Molt the pt caregiver is calling to see if there is a tsh lab order on file. She wants to know if the pt has to fast. Please contact the pt at [phone]

## 2023-12-28 DIAGNOSIS — E039 Hypothyroidism, unspecified: Secondary | ICD-10-CM | POA: Diagnosis not present

## 2023-12-29 ENCOUNTER — Ambulatory Visit: Payer: Self-pay | Admitting: Family Medicine

## 2023-12-29 LAB — TSH: TSH: 2.75 m[IU]/L (ref 0.40–4.50)

## 2024-01-01 ENCOUNTER — Other Ambulatory Visit: Payer: Self-pay | Admitting: Family Medicine

## 2024-01-01 DIAGNOSIS — E039 Hypothyroidism, unspecified: Secondary | ICD-10-CM

## 2024-01-01 MED ORDER — LEVOTHYROXINE SODIUM 100 MCG PO TABS
100.0000 ug | ORAL_TABLET | Freq: Every day | ORAL | 2 refills | Status: DC
Start: 2024-01-01 — End: 2024-02-09

## 2024-01-01 NOTE — Telephone Encounter (Signed)
 Copied from CRM [id number]. Topic: Clinical - Medication Refill >> Jan 01, 2024 10:34 AM Pinkey ORN wrote: Medication: levothyroxine  (SYNTHROID ) 100 MCG tablet  Has the patient contacted their pharmacy? Yes (Agent: If no, request that the patient contact the pharmacy for the refill. If patient does not wish to contact the pharmacy document the reason why and proceed with request.) (Agent: If yes, when and what did the pharmacy advise?)  This is the patient's preferred pharmacy:  [facility] - [place], [place] - [address] [place] [address] Phone: [phone] Fax: [phone]  Is this the correct pharmacy for this prescription? Yes If no, delete pharmacy and type the correct one.   Has the prescription been filled recently? Yes  Is the patient out of the medication? Yes  Has the patient been seen for an appointment in the last year OR does the patient have an upcoming appointment? Yes  Can we respond through MyChart? Yes  Agent: Please be advised that Rx refills may take up to 3 business days. We ask that you follow-up with your pharmacy.

## 2024-01-09 ENCOUNTER — Other Ambulatory Visit: Payer: Self-pay | Admitting: Family Medicine

## 2024-01-09 DIAGNOSIS — F424 Excoriation (skin-picking) disorder: Secondary | ICD-10-CM

## 2024-01-09 DIAGNOSIS — E785 Hyperlipidemia, unspecified: Secondary | ICD-10-CM

## 2024-01-09 NOTE — Telephone Encounter (Signed)
 Does the quantity need to be adjusted?

## 2024-01-23 ENCOUNTER — Ambulatory Visit: Admitting: Family Medicine

## 2024-01-24 ENCOUNTER — Other Ambulatory Visit: Payer: Self-pay | Admitting: Family Medicine

## 2024-01-24 DIAGNOSIS — I10 Essential (primary) hypertension: Secondary | ICD-10-CM

## 2024-01-29 ENCOUNTER — Other Ambulatory Visit: Payer: Self-pay | Admitting: Family Medicine

## 2024-01-29 DIAGNOSIS — I10 Essential (primary) hypertension: Secondary | ICD-10-CM

## 2024-01-30 ENCOUNTER — Ambulatory Visit: Admitting: Family Medicine

## 2024-01-31 ENCOUNTER — Ambulatory Visit: Admitting: Family Medicine

## 2024-01-31 ENCOUNTER — Encounter: Payer: Self-pay | Admitting: Family Medicine

## 2024-01-31 VITALS — BP 126/82 | HR 92 | Resp 16 | Ht 65.0 in | Wt 195.3 lb

## 2024-01-31 DIAGNOSIS — E785 Hyperlipidemia, unspecified: Secondary | ICD-10-CM | POA: Diagnosis not present

## 2024-01-31 DIAGNOSIS — F424 Excoriation (skin-picking) disorder: Secondary | ICD-10-CM

## 2024-01-31 DIAGNOSIS — R451 Restlessness and agitation: Secondary | ICD-10-CM

## 2024-01-31 DIAGNOSIS — J4520 Mild intermittent asthma, uncomplicated: Secondary | ICD-10-CM | POA: Diagnosis not present

## 2024-01-31 DIAGNOSIS — F70 Mild intellectual disabilities: Secondary | ICD-10-CM | POA: Diagnosis not present

## 2024-01-31 DIAGNOSIS — I700 Atherosclerosis of aorta: Secondary | ICD-10-CM

## 2024-01-31 DIAGNOSIS — Z23 Encounter for immunization: Secondary | ICD-10-CM

## 2024-01-31 DIAGNOSIS — E2839 Other primary ovarian failure: Secondary | ICD-10-CM

## 2024-01-31 DIAGNOSIS — E039 Hypothyroidism, unspecified: Secondary | ICD-10-CM | POA: Diagnosis not present

## 2024-01-31 DIAGNOSIS — Z1231 Encounter for screening mammogram for malignant neoplasm of breast: Secondary | ICD-10-CM

## 2024-01-31 DIAGNOSIS — J302 Other seasonal allergic rhinitis: Secondary | ICD-10-CM

## 2024-01-31 DIAGNOSIS — I10 Essential (primary) hypertension: Secondary | ICD-10-CM | POA: Diagnosis not present

## 2024-01-31 LAB — TSH: TSH: 6.56 m[IU]/L — ABNORMAL HIGH (ref 0.40–4.50)

## 2024-01-31 MED ORDER — MONTELUKAST SODIUM 10 MG PO TABS: 10.0000 mg | ORAL_TABLET | Freq: Every day | ORAL | 10 refills | Status: AC

## 2024-01-31 MED ORDER — GABAPENTIN 300 MG PO CAPS: 300.0000 mg | ORAL_CAPSULE | Freq: Every day | ORAL | 10 refills | Status: AC

## 2024-01-31 MED ORDER — AMLODIPINE BESYLATE 5 MG PO TABS: 5.0000 mg | ORAL_TABLET | Freq: Every day | ORAL | 10 refills | Status: AC

## 2024-01-31 NOTE — Progress Notes (Signed)
 Name: Rebecca Bruce   MRN: [id number]    DOB: 06-Jun-1960   Date:01/31/2024       Progress Note  Subjective  Chief Complaint  Chief Complaint  Patient presents with   Medical Management of Chronic Issues   Discussed the use of AI scribe software for clinical note transcription with the patient, who gave verbal consent to proceed.  History of Present Illness Rebecca Bruce is a 63 year old female who presents for a regular follow-up and medication refills. She came in with her caregiver   She has a history of hypertension and is currently taking amlodipine  5 mg daily without reported side effects such as swelling. Her home blood pressure readings are unavailable for review.  She has hyperlipidemia and atherosclerosis of the aorta, managed with atorvastatin  20 mg. Her recent lipid panel showed an LDL of 76.  She experiences mild intermittent asthma with seasonal allergies, characterized by a dry cough and runny nose. She takes montelukast  as needed, primarily from March to June, but is prepared to use it again if symptoms recur.  She has hypothyroidism following thyroid  ablation. Her TSH levels were previously suppressed. She takes levothyroxine  100 mcg six days a week, omitting Sundays.  She has  an intellectual disability and resides in a group home. She is under psychiatric care, taking paroxetine  and hydroxyzine  as needed for agitation, and gabapentin  at night, possibly for mood stabilization.  She has prediabetes and adheres to a heart-healthy diet at her group home, focusing on low sugar and low salt intake. Her A1c is slightly above normal.  No chest pain, headaches, or current reflux symptoms. Her appetite and bowel movements are reported as good.    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/2018   Hyperglycemia 07/13/2018   Pre-diabetes 01/05/2017   Dyslipidemia 07/02/2015   History of hyperthyroidism 07/02/2015   History of radioactive iodine thyroid  ablation 07/02/2015   Hypothyroidism (acquired) 07/02/2015   Hypertension, benign 07/02/2015   Anxiety 07/02/2015   Vitamin D  deficiency 07/02/2015   Insomnia 07/02/2015   OA (osteoarthritis) of knee 07/02/2015   GERD (gastroesophageal reflux disease) 07/02/2015    Past Surgical History:  Procedure Laterality Date   DENTAL SURGERY     IR ANGIOGRAM SELECTIVE EACH ADDITIONAL VESSEL  03/29/2019   IR ANGIOGRAM SELECTIVE EACH ADDITIONAL VESSEL  03/29/2019   IR ANGIOGRAM SELECTIVE EACH ADDITIONAL VESSEL  03/29/2019   IR ANGIOGRAM SELECTIVE EACH ADDITIONAL VESSEL  03/29/2019   IR ANGIOGRAM VISCERAL SELECTIVE  03/29/2019   IR ANGIOGRAM VISCERAL SELECTIVE  03/29/2019   IR US  GUIDE VASC ACCESS RIGHT  03/29/2019    Family History  Problem Relation Age of Onset   Heart disease Mother    Breast cancer Neg Hx     Social History   Tobacco Use   Smoking status: Never   Smokeless tobacco: Never   Tobacco comments:    smoking cessation materials not required  Substance Use Topics   Alcohol use: No     Alcohol/week: 0.0 standard drinks of alcohol     Current Outpatient Medications:    acetaminophen  (TYLENOL ) 325 MG tablet, Take 1-2 tablets (325-650 mg total) by mouth every 4 (four) hours as needed for mild pain., Disp:  , Rfl:    amLODipine  (NORVASC ) 5  MG tablet, TAKE 1 TABLET BY MOUTH ONCE DAILY, Disp: 7 tablet, Rfl: 10   atorvastatin  (LIPITOR) 20 MG tablet, TAKE 1 TABLET BY MOUTH ONCE DAILY AT 6PM, Disp: 7 tablet, Rfl: 10   Calcium -Vitamin D -Vitamin K  (VIACTIV CALCIUM  PLUS D) 650-12.5-40 MG-MCG-MCG CHEW, CHEW & SWALLOW 1 PIECE BY MOUTH TWICE DAILY, Disp: 100 tablet, Rfl: 11   clobetasol ointment (TEMOVATE) 0.05 %, Apply 1 Application topically 2 (two) times daily., Disp: , Rfl:    gabapentin  (NEURONTIN ) 300 MG capsule, TAKE 1 CAPSULE BY MOUTH AT BEDTIME *DO NOT CRUSH OR CHEW*, Disp: 2 capsule, Rfl: 10   hydrOXYzine  (VISTARIL ) 25 MG capsule, Take 1 capsule (25 mg total) by mouth every 8 (eight) hours as needed., Disp: 30 capsule, Rfl: 3   ipratropium (ATROVENT ) 0.06 % nasal spray, Place 2 sprays into both nostrils 4 (four) times daily., Disp: 15 mL, Rfl: 12   Ipratropium-Albuterol  (COMBIVENT ) 20-100 MCG/ACT AERS respimat, Inhale 1 puff into the lungs every 6 (six) hours as needed for wheezing., Disp: 4 g, Rfl: 0   levocetirizine (XYZAL ) 5 MG tablet, Take 1 tablet (5 mg total) by mouth every evening. From March until June yearly, Disp: 30 tablet, Rfl: 3   levothyroxine  (SYNTHROID ) 100 MCG tablet, Take 1 tablet (100 mcg total) by mouth daily before breakfast. Take 6 days a week only, skip Sundays and Recheck TSH  first week September, Disp: 26 tablet, Rfl: 2   LIQUID C 500 MG/5ML LIQD liquid, TAKE 5 ML (500MG ) BY MOUTH ONCE DAILY, Disp: 120 mL, Rfl: 10   LORazepam  (ATIVAN ) 0.5 MG tablet, Take 0.5 mg by mouth daily as needed for anxiety. Can take an extra pill 30 minutes after the first dose if needed, Disp: , Rfl:    montelukast  (SINGULAIR ) 10 MG tablet, Take 1 tablet (10 mg total) by mouth at  bedtime. From March until June yearly, Disp: 30 tablet, Rfl: 2   Multiple Vitamins-Minerals (THERA-TABS M) TABS, TAKE 1 TABLET BY MOUTH ONCE DAILY, Disp: 3 tablet, Rfl: 10   PARoxetine  (PAXIL ) 30 MG tablet, TAKE 1 TABLET BY MOUTH EVERY NIGHT AT BEDTIME *DO NOT CRUSH OR CHEW* *HAZARDOUS DRUG: WEAR GLOVES*, Disp: 90 tablet, Rfl: 1   saccharomyces boulardii (FLORASTOR) 250 MG capsule, Take 1 capsule (250 mg total) by mouth daily., Disp: 30 capsule, Rfl: 0   Skin Protectants, Misc. (MINERIN CREME) CREA, APPLY 1 APPLIACTION  TOPICALLY ONCE DAILY  TO BILATERAL FEET, Disp: 396 g, Rfl: 10   triamcinolone  cream (KENALOG ) 0.1 %, Apply 1 Application topically 2 (two) times daily., Disp: , Rfl:    Zinc  Sulfate 220 (50 Zn) MG TABS, TAKE 1 TABLET BY MOUTH ONCE DAILY, Disp: 2 tablet, Rfl: 10  No Known Allergies  I personally reviewed active problem list, medication list, allergies with the patient/caregiver today.   ROS  Ten systems reviewed and is negative except as mentioned in HPI    Objective Physical Exam  CONSTITUTIONAL: Patient appears well-developed and well-nourished. No distress. HEENT: Head atraumatic, normocephalic, neck supple. Ears, eyes, nose, sinuses, mouth, and throat are normal. CARDIOVASCULAR: Normal rate, regular rhythm and normal heart sounds. No murmur heard. No BLE edema. PULMONARY: Effort normal and breath sounds normal. No respiratory distress. ABDOMINAL: There is no tenderness or distention. MUSCULOSKELETAL: Normal gait. Without gross motor or sensory deficit. SKIN: traction alopecia  PSYCHIATRIC: Patient has a normal mood and affect. Cooperative   Vitals:   01/31/24 0837  BP: 126/82  Pulse: 92  Resp: 16  SpO2: 97%  Weight: 195 lb 4.8 oz (88.6 kg)  Height: 5' 5 (1.651 m)    Body mass index is 32.5 kg/m.  Recent Results (from the past 2160 hours)  TSH     Status: None   Collection Time: 12/28/23  9:16 AM  Result Value Ref Range   TSH 2.75 0.40 - 4.50 mIU/L     Diabetic Foot Exam:     PHQ2/9:    01/31/2024    8:33 AM 07/26/2023    9:07 AM 07/21/2023   11:19 AM 03/24/2023   10:41 AM 08/09/2022   11:17 AM  Depression screen PHQ 2/9  Decreased Interest 0 0 0 0 0  Down, Depressed, Hopeless 0  0 0 0  PHQ - 2 Score 0 0 0 0 0  Altered sleeping   0 0   Tired, decreased energy   0 0   Change in appetite   0 0   Feeling bad or failure about yourself    0 0   Trouble concentrating   0 0   Moving slowly or fidgety/restless   0 0   Suicidal thoughts   0 0   PHQ-9 Score   0 0   Difficult doing work/chores   Not difficult at all Not difficult at all     phq 9 is negative  Fall Risk:    01/31/2024    8:33 AM 07/26/2023    9:07 AM 01/24/2023    9:46 AM 08/09/2022   11:17 AM 07/25/2022   10:50 AM  Fall Risk   Falls in the past year? 0 0 0 0 0  Number falls in past yr: 0 0  0 0  Injury with Fall? 0 0  0 0  Risk for fall due to : No Fall Risks No Fall Risks No Fall Risks No Fall Risks No Fall Risks  Follow up Falls evaluation completed Falls evaluation completed Falls prevention discussed Falls prevention discussed Falls prevention discussed      Assessment & Plan Hypertension Blood pressure controlled at 126/82 mmHg with current medication. - Continue amlodipine  5 mg daily. - Provide refill for amlodipine .  Hyperlipidemia and atherosclerosis of aorta Managed with atorvastatin  20 mg. LDL at 76 mg/dL. - Continue atorvastatin  20 mg as prescribed. - Provide refill for atorvastatin .  Asthma with seasonal allergies Mild intermittent symptoms managed with montelukast  seasonally. - Prescribe montelukast  as needed for cough, wheezing, or runny nose.  Hypothyroidism after thyroid  ablation TSH levels normalized on current levothyroxine  regimen. - Continue levothyroxine  100 mcg six days a week. - Recheck TSH levels.  Intellectual disability Resides in a group home with support.  Prediabetes A1c slightly elevated. Managed with diet. -  Continue heart-healthy diet with low sugar and low salt intake.  Depression and agitation Managed with paroxetine  and gabapentin . Hydroxyzine  available for agitation. - Continue paroxetine  as prescribed. - Provide refill for gabapentin . - Continue hydroxyzine  as needed for agitation.  General Health Maintenance Due for routine screenings and vaccinations. Discussed mammogram and bone density scan. - Schedule mammogram. - Schedule bone density scan. - Administer flu shot today.

## 2024-02-01 ENCOUNTER — Ambulatory Visit: Payer: Self-pay | Admitting: Family Medicine

## 2024-02-02 ENCOUNTER — Telehealth: Payer: Self-pay

## 2024-02-02 NOTE — Telephone Encounter (Signed)
 Copied from CRM [id number]. Topic: General - Other >> Feb 02, 2024  8:28 AM Berneda FALCON wrote: Reason for CRM: Maudie returning phone call to [facility]. Please call her back at [phone] to discuss how patient is taking medications.

## 2024-02-02 NOTE — Telephone Encounter (Signed)
 Spoke to [facility], pt has been taking it daily and skipping Sundays, but a month ago she was out for 3-4 days due to no refills at pharmacy. Advised Maudie will probably hear from me Tuesday since PCP is out of office

## 2024-02-06 NOTE — Telephone Encounter (Signed)
 Spoke to [facility] caregiver, please send new prescription sent to [facility]. Advised she will need to be back in 6 weeks to recheck lab

## 2024-02-09 ENCOUNTER — Other Ambulatory Visit: Payer: Self-pay | Admitting: Family Medicine

## 2024-02-09 ENCOUNTER — Telehealth: Payer: Self-pay

## 2024-02-09 DIAGNOSIS — E039 Hypothyroidism, unspecified: Secondary | ICD-10-CM

## 2024-02-09 MED ORDER — LEVOTHYROXINE SODIUM 100 MCG PO TABS: 100.0000 ug | ORAL_TABLET | Freq: Every day | ORAL | 1 refills | Status: DC

## 2024-02-09 NOTE — Telephone Encounter (Signed)
 Please clarify dosage I don't see documented in chart? Caregiver told for patient to take levothyroxine  100mcg Monday-Fri.  And dose and half on sat. And nothing on Sunday?  They will also need new rx to follow these instructions?

## 2024-02-09 NOTE — Telephone Encounter (Signed)
 Copied from CRM [id number]. Topic: Clinical - Prescription Issue >> Feb 08, 2024  4:16 PM Sasha M wrote: Reason for CRM: Maudie (nurse at [facility]), added on half of levothyroxine  for Sunday but missing dose for this week. Spoke to [facility] to confirm this change and med did not get delivered on Thursday. Call back number is [phone]

## 2024-02-09 NOTE — Telephone Encounter (Signed)
 Clarification 100mcg m-s and 50 on sun.

## 2024-02-09 NOTE — Telephone Encounter (Signed)
 Savannah calling from [facility] is calling for clarification of sig on levothyroxine  (SYNTHROID ) 100 MCG tablet [[id number]]  CB- [phone]

## 2024-03-27 DIAGNOSIS — H903 Sensorineural hearing loss, bilateral: Secondary | ICD-10-CM | POA: Diagnosis not present

## 2024-04-01 ENCOUNTER — Other Ambulatory Visit

## 2024-04-01 ENCOUNTER — Ambulatory Visit
Admission: RE | Admit: 2024-04-01 | Discharge: 2024-04-01 | Disposition: A | Discharge status: Home / Self Care | Source: Ambulatory Visit | Attending: Family Medicine | Admitting: Family Medicine

## 2024-04-01 ENCOUNTER — Other Ambulatory Visit: Payer: Self-pay | Admitting: Family Medicine

## 2024-04-01 DIAGNOSIS — Z1231 Encounter for screening mammogram for malignant neoplasm of breast: Secondary | ICD-10-CM | POA: Insufficient documentation

## 2024-04-01 DIAGNOSIS — E2839 Other primary ovarian failure: Secondary | ICD-10-CM

## 2024-04-01 DIAGNOSIS — E039 Hypothyroidism, unspecified: Secondary | ICD-10-CM | POA: Insufficient documentation

## 2024-04-03 NOTE — Telephone Encounter (Signed)
 Requested Prescriptions  Pending Prescriptions Disp Refills   levothyroxine  (SYNTHROID ) 100 MCG tablet [Pharmacy Med Name: Levothyroxine  Sodium 100 MCG Tablet] 84 tablet 1    Sig: TAKE 1 TABLET BY MOUTH ONCE DAILY BEFORE BREAKFAST EVERY DAY EXCEPT SUNDAY *TAKE ON AN EMPTY STOMACH*;TAKE 1/2 TABLET =50MG  BY MOUTH DAILY BEFORE BREKAFAST ON SUNDAY     Endocrinology:  Hypothyroid Agents Failed - 04/03/2024  2:12 PM      Failed - TSH in normal range and within 360 days    TSH  Date Value Ref Range Status  01/31/2024 6.56 (H) 0.40 - 4.50 mIU/L Final         Passed - Valid encounter within last 12 months    Recent Outpatient Visits           2 months ago Mild intermittent reactive airway disease without complication   [facility] Glenard Mire, MD   8 months ago Annual physical exam   [facility] Bernardo Fend, DO   8 months ago Atherosclerosis of aorta   [facility] Glenard Mire, MD       Future Appointments             In 3 months Glenard, Krichna, MD [facility] [place]

## 2024-04-15 NOTE — Progress Notes (Deleted)
 "  ANNUAL GYNECOLOGICAL EXAM  HPI  Rebecca Bruce is a 64 y.o.-year-old who presents for an annual gynecological exam today.  She denies pelvic pain, dyspareunia, abnormal vaginal bleeding or discharge, and UTI symptoms. *** She lives in a group home and has mild intellectual disability. She has a PCP that she sees regular for wellness visits and chronic disease management.  Medical/Surgical History Past Medical History:  Diagnosis Date   GERD (gastroesophageal reflux disease)    Hypertension    Thyroid  disease    Past Surgical History:  Procedure Laterality Date   DENTAL SURGERY     IR ANGIOGRAM SELECTIVE EACH ADDITIONAL VESSEL  03/29/2019   IR ANGIOGRAM SELECTIVE EACH ADDITIONAL VESSEL  03/29/2019   IR ANGIOGRAM SELECTIVE EACH ADDITIONAL VESSEL  03/29/2019   IR ANGIOGRAM SELECTIVE EACH ADDITIONAL VESSEL  03/29/2019   IR ANGIOGRAM VISCERAL SELECTIVE  03/29/2019   IR ANGIOGRAM VISCERAL SELECTIVE  03/29/2019   IR US  GUIDE VASC ACCESS RIGHT  03/29/2019    Social History Lives in a group home setting. Work: None Exercise: *** Substances: Denies EtOH, tobacco, vape, and recreational drugs  Obstetric History OB History   No obstetric history on file.      GYN/Menstrual History No LMP recorded. Patient is postmenopausal. Last Pap: None on file Contraception: N/A  Prevention Mammogram 03/2024 WNL  Colon cancer screening UTD Flu shot/vaccines UTD  Current Medications Show/hide medication list[1]      ROS Constitutional: Denied constitutional symptoms, night sweats, recent illness, fatigue, fever, insomnia and weight loss.  Eyes: Denied eye symptoms, eye pain, photophobia, vision change and visual disturbance.  Ears/Nose/Throat/Neck: Denied ear, nose, throat or neck symptoms, hearing loss, nasal discharge, sinus congestion and sore throat.  Cardiovascular: Denied cardiovascular symptoms, arrhythmia, chest pain/pressure, edema, exercise intolerance, orthopnea  and palpitations.  Respiratory: Denied pulmonary symptoms, asthma, pleuritic pain, productive sputum, cough, dyspnea and wheezing.  Gastrointestinal: Denied gastro-esophageal reflux, melena, nausea and vomiting.  Genitourinary:*** Denied genitourinary symptoms including symptomatic vaginal discharge, pelvic relaxation issues, and urinary complaints.  Musculoskeletal: Denied musculoskeletal symptoms, stiffness, swelling, muscle weakness and myalgia.  Dermatologic: Denied dermatology symptoms, rash and scar.  Neurologic: Denied neurology symptoms, dizziness, headache, neck pain and syncope.  Psychiatric: Denied psychiatric symptoms, anxiety and depression.  Endocrine: Denied endocrine symptoms including hot flashes and night sweats.    OBJECTIVE  There were no vitals taken for this visit.   Physical examination General NAD, Conversant  HEENT Atraumatic; Op clear with mmm.  Normo-cephalic. Pupils reactive. Anicteric sclerae  Thyroid /Neck Smooth without nodularity or enlargement. Normal ROM.  Neck Supple.  Skin No rashes, lesions or ulceration. Normal palpated skin turgor. No nodularity.  Breasts: No masses or discharge.  Symmetric.  No axillary adenopathy.  Lungs: Clear to auscultation.No rales or wheezes. Normal Respiratory effort, no retractions.  Heart: NSR.  No murmurs or rubs appreciated. No peripheral edema  Abdomen: Soft.  Non-tender.  No masses.  No HSM. No hernia  Extremities: Moves all appropriately.  Normal ROM for age. No lymphadenopathy.  Neuro: Oriented to PPT.  Normal mood. Normal affect.     Pelvic:   Vulva: Normal appearance.  No lesions.  Vagina: No lesions or abnormalities noted.  Support: Normal pelvic support.  Urethra No masses tenderness or scarring.  Meatus Normal size without lesions or prolapse.  Cervix: Normal appearance.  No lesions.  Anus: Normal exam.  No lesions.  Perineum: Normal exam.  No lesions.        Bimanual   Uterus: Normal  size.  Non-tender.   Mobile.  AV.  Adnexae: No masses.  Non-tender to palpation.  Cul-de-sac: Negative for abnormality.    ASSESSMENT/ PLAN 1) Physical exam as noted. Discussed healthy lifestyle choices and preventive care. 2) *** 3) Return in one year for annual exam or as needed for concerns.   Lauraine Lakes, CNM     [1]  Outpatient Medications Prior to Visit  Medication Sig   acetaminophen  (TYLENOL ) 325 MG tablet Take 1-2 tablets (325-650 mg total) by mouth every 4 (four) hours as needed for mild pain.   amLODipine  (NORVASC ) 5 MG tablet Take 1 tablet (5 mg total) by mouth daily.   atorvastatin  (LIPITOR) 20 MG tablet TAKE 1 TABLET BY MOUTH ONCE DAILY AT 6PM   Calcium -Vitamin D -Vitamin K  (VIACTIV CALCIUM  PLUS D) 650-12.5-40 MG-MCG-MCG CHEW CHEW & SWALLOW 1 PIECE BY MOUTH TWICE DAILY   clobetasol ointment (TEMOVATE) 0.05 % Apply 1 Application topically 2 (two) times daily.   gabapentin  (NEURONTIN ) 300 MG capsule Take 1 capsule (300 mg total) by mouth at bedtime.   hydrOXYzine  (VISTARIL ) 25 MG capsule Take 1 capsule (25 mg total) by mouth every 8 (eight) hours as needed.   ipratropium (ATROVENT ) 0.06 % nasal spray Place 2 sprays into both nostrils 4 (four) times daily.   Ipratropium-Albuterol  (COMBIVENT ) 20-100 MCG/ACT AERS respimat Inhale 1 puff into the lungs every 6 (six) hours as needed for wheezing.   levocetirizine (XYZAL ) 5 MG tablet Take 1 tablet (5 mg total) by mouth every evening. From March until June yearly   levothyroxine  (SYNTHROID ) 100 MCG tablet TAKE 1 TABLET BY MOUTH ONCE DAILY BEFORE BREAKFAST EVERY DAY EXCEPT SUNDAY *TAKE ON AN EMPTY STOMACH*;TAKE 1/2 TABLET =50MG  BY MOUTH DAILY BEFORE BREKAFAST ON SUNDAY   LIQUID C 500 MG/5ML LIQD liquid TAKE 5 ML (500MG ) BY MOUTH ONCE DAILY   LORazepam  (ATIVAN ) 0.5 MG tablet Take 0.5 mg by mouth daily as needed for anxiety. Can take an extra pill 30 minutes after the first dose if needed   montelukast  (SINGULAIR ) 10 MG tablet Take 1 tablet (10 mg total) by  mouth at bedtime. For cough, wheezing or rhinorrhea   Multiple Vitamins-Minerals (THERA-TABS M) TABS TAKE 1 TABLET BY MOUTH ONCE DAILY   PARoxetine  (PAXIL ) 30 MG tablet TAKE 1 TABLET BY MOUTH EVERY NIGHT AT BEDTIME *DO NOT CRUSH OR CHEW* *HAZARDOUS DRUG: WEAR GLOVES*   saccharomyces boulardii (FLORASTOR) 250 MG capsule Take 1 capsule (250 mg total) by mouth daily.   Skin Protectants, Misc. (MINERIN CREME) CREA APPLY 1 APPLIACTION  TOPICALLY ONCE DAILY  TO BILATERAL FEET   triamcinolone  cream (KENALOG ) 0.1 % Apply 1 Application topically 2 (two) times daily.   Zinc  Sulfate 220 (50 Zn) MG TABS TAKE 1 TABLET BY MOUTH ONCE DAILY   No facility-administered medications prior to visit.   "

## 2024-04-16 ENCOUNTER — Encounter: Admitting: Registered Nurse

## 2024-04-16 DIAGNOSIS — Z0000 Encounter for general adult medical examination without abnormal findings: Secondary | ICD-10-CM

## 2024-05-01 ENCOUNTER — Ambulatory Visit: Admitting: Family Medicine

## 2024-07-24 ENCOUNTER — Ambulatory Visit: Admitting: Family Medicine

## 2024-07-26 ENCOUNTER — Encounter: Admitting: Family Medicine

## 2024-08-08 ENCOUNTER — Ambulatory Visit: Admitting: Family Medicine
